# Patient Record
Sex: Female | Born: 1950
Health system: Southern US, Community
[De-identification: ages and names within clinical notes are randomized; demographics above are authoritative.]

## PROBLEM LIST (undated history)

## (undated) DIAGNOSIS — I10 Essential (primary) hypertension: Secondary | ICD-10-CM

## (undated) DIAGNOSIS — K5790 Diverticulosis of intestine, part unspecified, without perforation or abscess without bleeding: Secondary | ICD-10-CM

## (undated) DIAGNOSIS — Z9889 Other specified postprocedural states: Secondary | ICD-10-CM

## (undated) DIAGNOSIS — J189 Pneumonia, unspecified organism: Secondary | ICD-10-CM

## (undated) DIAGNOSIS — T1490XA Injury, unspecified, initial encounter: Secondary | ICD-10-CM

## (undated) DIAGNOSIS — S42009A Fracture of unspecified part of unspecified clavicle, initial encounter for closed fracture: Secondary | ICD-10-CM

## (undated) DIAGNOSIS — R112 Nausea with vomiting, unspecified: Secondary | ICD-10-CM

## (undated) DIAGNOSIS — I351 Nonrheumatic aortic (valve) insufficiency: Secondary | ICD-10-CM

## (undated) DIAGNOSIS — M8580 Other specified disorders of bone density and structure, unspecified site: Secondary | ICD-10-CM

## (undated) DIAGNOSIS — I480 Paroxysmal atrial fibrillation: Secondary | ICD-10-CM

## (undated) DIAGNOSIS — R011 Cardiac murmur, unspecified: Secondary | ICD-10-CM

## (undated) DIAGNOSIS — H539 Unspecified visual disturbance: Secondary | ICD-10-CM

## (undated) HISTORY — DX: Injury, unspecified, initial encounter: T14.90XA

## (undated) HISTORY — DX: Other specified disorders of bone density and structure, unspecified site: M85.80

## (undated) HISTORY — PX: TONSILLECTOMY AND ADENOIDECTOMY: SUR1326

## (undated) HISTORY — DX: Paroxysmal atrial fibrillation: I48.0

## (undated) HISTORY — PX: LAPAROSCOPIC SALPINGO OOPHERECTOMY: SHX5927

## (undated) HISTORY — DX: Essential (primary) hypertension: I10

## (undated) HISTORY — PX: APPENDECTOMY: SHX54

## (undated) HISTORY — DX: Cardiac murmur, unspecified: R01.1

## (undated) HISTORY — DX: Unspecified visual disturbance: H53.9

## (undated) HISTORY — DX: Diverticulosis of intestine, part unspecified, without perforation or abscess without bleeding: K57.90

## (undated) HISTORY — DX: Fracture of unspecified part of unspecified clavicle, initial encounter for closed fracture: S42.009A

## (undated) HISTORY — PX: ABDOMINAL HYSTERECTOMY: SHX81

---

## 1998-10-05 ENCOUNTER — Other Ambulatory Visit: Admission: RE | Admit: 1998-10-05 | Discharge: 1998-10-05 | Payer: Self-pay | Admitting: Gynecology

## 1999-03-11 ENCOUNTER — Other Ambulatory Visit: Admission: RE | Admit: 1999-03-11 | Discharge: 1999-03-11 | Payer: Self-pay | Admitting: Gynecology

## 2000-10-26 ENCOUNTER — Other Ambulatory Visit: Admission: RE | Admit: 2000-10-26 | Discharge: 2000-10-26 | Payer: Self-pay | Admitting: Gynecology

## 2001-06-10 ENCOUNTER — Encounter: Payer: Self-pay | Admitting: Internal Medicine

## 2001-10-29 ENCOUNTER — Other Ambulatory Visit: Admission: RE | Admit: 2001-10-29 | Discharge: 2001-10-29 | Payer: Self-pay | Admitting: Gynecology

## 2002-11-03 ENCOUNTER — Other Ambulatory Visit: Admission: RE | Admit: 2002-11-03 | Discharge: 2002-11-03 | Payer: Self-pay | Admitting: Gynecology

## 2003-11-24 ENCOUNTER — Other Ambulatory Visit: Admission: RE | Admit: 2003-11-24 | Discharge: 2003-11-24 | Payer: Self-pay | Admitting: Gynecology

## 2004-09-08 ENCOUNTER — Ambulatory Visit: Payer: Self-pay | Admitting: Internal Medicine

## 2004-12-06 ENCOUNTER — Other Ambulatory Visit: Admission: RE | Admit: 2004-12-06 | Discharge: 2004-12-06 | Payer: Self-pay | Admitting: Gynecology

## 2005-03-03 ENCOUNTER — Ambulatory Visit: Payer: Self-pay | Admitting: Internal Medicine

## 2005-11-28 ENCOUNTER — Ambulatory Visit: Payer: Self-pay | Admitting: Internal Medicine

## 2005-12-06 ENCOUNTER — Ambulatory Visit: Payer: Self-pay | Admitting: Cardiology

## 2005-12-07 ENCOUNTER — Other Ambulatory Visit: Admission: RE | Admit: 2005-12-07 | Discharge: 2005-12-07 | Payer: Self-pay | Admitting: Gynecology

## 2005-12-21 ENCOUNTER — Ambulatory Visit: Payer: Self-pay | Admitting: Internal Medicine

## 2006-01-16 ENCOUNTER — Ambulatory Visit: Payer: Self-pay | Admitting: Internal Medicine

## 2006-02-13 ENCOUNTER — Ambulatory Visit: Payer: Self-pay | Admitting: Internal Medicine

## 2006-11-30 ENCOUNTER — Encounter: Payer: Self-pay | Admitting: Internal Medicine

## 2006-12-10 ENCOUNTER — Other Ambulatory Visit: Admission: RE | Admit: 2006-12-10 | Discharge: 2006-12-10 | Payer: Self-pay | Admitting: Gynecology

## 2006-12-29 DIAGNOSIS — S42009A Fracture of unspecified part of unspecified clavicle, initial encounter for closed fracture: Secondary | ICD-10-CM

## 2006-12-29 HISTORY — DX: Fracture of unspecified part of unspecified clavicle, initial encounter for closed fracture: S42.009A

## 2007-01-13 ENCOUNTER — Emergency Department (HOSPITAL_COMMUNITY): Admission: EM | Admit: 2007-01-13 | Discharge: 2007-01-13 | Payer: Self-pay | Admitting: Family Medicine

## 2007-01-16 ENCOUNTER — Ambulatory Visit: Payer: Self-pay | Admitting: Internal Medicine

## 2007-02-13 ENCOUNTER — Ambulatory Visit: Payer: Self-pay | Admitting: Internal Medicine

## 2007-03-05 ENCOUNTER — Ambulatory Visit: Payer: Self-pay | Admitting: Internal Medicine

## 2007-03-05 LAB — CONVERTED CEMR LAB
ALT: 19 units/L (ref 0–40)
AST: 25 units/L (ref 0–37)
BUN: 12 mg/dL (ref 6–23)
Bilirubin, Direct: 0.1 mg/dL (ref 0.0–0.3)
CO2: 31 meq/L (ref 19–32)
Eosinophils Absolute: 0.1 10*3/uL (ref 0.0–0.6)
Eosinophils Relative: 2.2 % (ref 0.0–5.0)
GFR calc non Af Amer: 79 mL/min
Glucose, Bld: 92 mg/dL (ref 70–99)
HCT: 41.9 % (ref 36.0–46.0)
Lymphocytes Relative: 30.7 % (ref 12.0–46.0)
MCHC: 34.3 g/dL (ref 30.0–36.0)
MCV: 96.2 fL (ref 78.0–100.0)
Monocytes Relative: 8.1 % (ref 3.0–11.0)
Platelets: 235 10*3/uL (ref 150–400)
Potassium: 3.8 meq/L (ref 3.5–5.1)
Sodium: 143 meq/L (ref 135–145)
Total CHOL/HDL Ratio: 2.9
Triglycerides: 87 mg/dL (ref 0–149)
WBC: 4.3 10*3/uL — ABNORMAL LOW (ref 4.5–10.5)

## 2007-03-12 ENCOUNTER — Ambulatory Visit: Payer: Self-pay | Admitting: Internal Medicine

## 2007-03-28 ENCOUNTER — Encounter: Payer: Self-pay | Admitting: Internal Medicine

## 2007-03-28 DIAGNOSIS — J309 Allergic rhinitis, unspecified: Secondary | ICD-10-CM | POA: Insufficient documentation

## 2007-04-24 ENCOUNTER — Ambulatory Visit: Payer: Self-pay | Admitting: Internal Medicine

## 2007-04-24 ENCOUNTER — Encounter: Payer: Self-pay | Admitting: Internal Medicine

## 2007-05-15 ENCOUNTER — Ambulatory Visit: Payer: Self-pay | Admitting: Internal Medicine

## 2007-05-15 LAB — CONVERTED CEMR LAB: Vit D, 1,25-Dihydroxy: 44 (ref 20–57)

## 2007-05-22 ENCOUNTER — Ambulatory Visit: Payer: Self-pay | Admitting: Internal Medicine

## 2007-05-22 DIAGNOSIS — R030 Elevated blood-pressure reading, without diagnosis of hypertension: Secondary | ICD-10-CM | POA: Insufficient documentation

## 2007-05-22 DIAGNOSIS — E559 Vitamin D deficiency, unspecified: Secondary | ICD-10-CM | POA: Insufficient documentation

## 2007-10-30 ENCOUNTER — Encounter: Payer: Self-pay | Admitting: Internal Medicine

## 2007-11-01 ENCOUNTER — Telehealth: Payer: Self-pay | Admitting: *Deleted

## 2007-12-16 ENCOUNTER — Other Ambulatory Visit: Admission: RE | Admit: 2007-12-16 | Discharge: 2007-12-16 | Payer: Self-pay | Admitting: Gynecology

## 2008-02-13 ENCOUNTER — Ambulatory Visit: Payer: Self-pay | Admitting: Internal Medicine

## 2008-04-20 ENCOUNTER — Telehealth: Payer: Self-pay | Admitting: Internal Medicine

## 2008-04-22 ENCOUNTER — Encounter: Payer: Self-pay | Admitting: Internal Medicine

## 2008-05-08 ENCOUNTER — Ambulatory Visit: Payer: Self-pay | Admitting: Internal Medicine

## 2008-05-08 DIAGNOSIS — F438 Other reactions to severe stress: Secondary | ICD-10-CM

## 2008-05-08 DIAGNOSIS — F4389 Other reactions to severe stress: Secondary | ICD-10-CM | POA: Insufficient documentation

## 2008-05-08 DIAGNOSIS — L259 Unspecified contact dermatitis, unspecified cause: Secondary | ICD-10-CM | POA: Insufficient documentation

## 2008-11-30 LAB — CONVERTED CEMR LAB: Pap Smear: NORMAL

## 2009-02-12 ENCOUNTER — Ambulatory Visit: Payer: Self-pay | Admitting: Internal Medicine

## 2009-03-22 ENCOUNTER — Ambulatory Visit: Payer: Self-pay | Admitting: Internal Medicine

## 2009-04-27 ENCOUNTER — Encounter: Payer: Self-pay | Admitting: Internal Medicine

## 2009-05-10 ENCOUNTER — Encounter: Payer: Self-pay | Admitting: Internal Medicine

## 2009-05-21 ENCOUNTER — Telehealth: Payer: Self-pay | Admitting: Internal Medicine

## 2009-09-03 ENCOUNTER — Ambulatory Visit: Payer: Self-pay | Admitting: Internal Medicine

## 2009-09-03 LAB — CONVERTED CEMR LAB
Alkaline Phosphatase: 34 units/L — ABNORMAL LOW (ref 39–117)
BUN: 18 mg/dL (ref 6–23)
Basophils Relative: 0.4 % (ref 0.0–3.0)
Bilirubin Urine: NEGATIVE
Blood in Urine, dipstick: NEGATIVE
CO2: 30 meq/L (ref 19–32)
Calcium: 9.4 mg/dL (ref 8.4–10.5)
Chloride: 108 meq/L (ref 96–112)
Cholesterol: 214 mg/dL — ABNORMAL HIGH (ref 0–200)
Creatinine, Ser: 0.8 mg/dL (ref 0.4–1.2)
Eosinophils Absolute: 0.1 10*3/uL (ref 0.0–0.7)
Eosinophils Relative: 1.4 % (ref 0.0–5.0)
Glucose, Bld: 87 mg/dL (ref 70–99)
Ketones, urine, test strip: NEGATIVE
Lymphocytes Relative: 30.7 % (ref 12.0–46.0)
Lymphs Abs: 1.6 10*3/uL (ref 0.7–4.0)
MCHC: 33.7 g/dL (ref 30.0–36.0)
MCV: 99.7 fL (ref 78.0–100.0)
Monocytes Relative: 7.4 % (ref 3.0–12.0)
Neutro Abs: 3.2 10*3/uL (ref 1.4–7.7)
Neutrophils Relative %: 60.1 % (ref 43.0–77.0)
Platelets: 213 10*3/uL (ref 150.0–400.0)
RBC: 4.1 M/uL (ref 3.87–5.11)
RDW: 11.7 % (ref 11.5–14.6)
Total CHOL/HDL Ratio: 3
Total Protein: 7.5 g/dL (ref 6.0–8.3)
VLDL: 9.6 mg/dL (ref 0.0–40.0)
WBC Urine, dipstick: NEGATIVE
pH: 8.5

## 2009-09-10 ENCOUNTER — Ambulatory Visit: Payer: Self-pay | Admitting: Internal Medicine

## 2009-09-10 DIAGNOSIS — T1490XA Injury, unspecified, initial encounter: Secondary | ICD-10-CM

## 2009-09-10 HISTORY — DX: Injury, unspecified, initial encounter: T14.90XA

## 2009-10-26 ENCOUNTER — Encounter: Payer: Self-pay | Admitting: Internal Medicine

## 2009-11-17 ENCOUNTER — Encounter: Payer: Self-pay | Admitting: Internal Medicine

## 2010-02-11 ENCOUNTER — Ambulatory Visit: Payer: Self-pay | Admitting: Internal Medicine

## 2010-03-03 ENCOUNTER — Telehealth: Payer: Self-pay | Admitting: *Deleted

## 2010-04-19 ENCOUNTER — Encounter: Payer: Self-pay | Admitting: Internal Medicine

## 2010-05-20 ENCOUNTER — Encounter: Payer: Self-pay | Admitting: Internal Medicine

## 2010-05-23 ENCOUNTER — Encounter: Payer: Self-pay | Admitting: Internal Medicine

## 2010-07-21 ENCOUNTER — Ambulatory Visit: Payer: Self-pay | Admitting: Family Medicine

## 2010-07-21 DIAGNOSIS — R071 Chest pain on breathing: Secondary | ICD-10-CM | POA: Insufficient documentation

## 2010-09-29 ENCOUNTER — Ambulatory Visit: Payer: Self-pay | Admitting: Internal Medicine

## 2010-09-29 DIAGNOSIS — R0789 Other chest pain: Secondary | ICD-10-CM | POA: Insufficient documentation

## 2010-09-30 ENCOUNTER — Ambulatory Visit: Payer: Self-pay | Admitting: Internal Medicine

## 2010-09-30 ENCOUNTER — Encounter: Payer: Self-pay | Admitting: Internal Medicine

## 2010-10-12 ENCOUNTER — Telehealth: Payer: Self-pay | Admitting: Internal Medicine

## 2010-11-15 ENCOUNTER — Encounter: Payer: Self-pay | Admitting: Internal Medicine

## 2010-11-28 ENCOUNTER — Encounter: Payer: Self-pay | Admitting: Internal Medicine

## 2010-12-01 NOTE — Progress Notes (Signed)
Summary: continue on fosamax  Phone Note Call from Patient Call back at [phone]   Caller: Patient Summary of Call: Pt wants to know if she needs to continue on the fosamax. Pt states that we can leave her a message on her cell. Initial call taken by: Romualdo Bolk, CMA Duncan Dull),  October 12, 2010 9:09 AM  Follow-up for Phone Call        since she has been on  meds for over 6 years  would  stop the fosamax and then repeat the dexa in 2 years. Follow-up by: Madelin Headings MD,  October 16, 2010 4:29 PM  Additional Follow-up for Phone Call Additional follow up Details #1::        Notified pt. Additional Follow-up by: Lynann Beaver CMA AAMA,  October 17, 2010 11:29 AM

## 2010-12-01 NOTE — Assessment & Plan Note (Signed)
Summary: DISCUSS MEDS // RS   Vital Signs:  Patient profile:   60 year old female Menstrual status:  postmenopausal Weight:      114 pounds Temp:     98.1 degrees F oral BP sitting:   138 / 78  (left arm) Cuff size:   regular  Vitals Entered By: Sid Falcon LPN (September 29, 2010 10:00 AM)  Serial Vital Signs/Assessments:  Time      Position  BP       Pulse  Resp  Temp     By                     130/78                         Madelin Headings MD  Comments: right arm  reg cuff By: Madelin Headings MD    History of Present Illness: [name] [name] comes in today  for  yearly check. Now   insurance   costs will be going up and wants to check with  any advised monitoring  Since last visit  here  there have been no major changes in health status  . she sees her gynecologist on a regular basis. Has seen Dr Maple Hudson. for her allergies and stable  she has had some vague right  chest tight   since her breast bx.  no nodules noted possibly chest wall near her bra line  just wanted to check. considering botox for couple areas of her face asks about safety. use for facial furrows. BP has been doing well. Osteoporosis : osteopenia: she's been on Fosamax and her last bone density was almost 3 years ago. No recent fractures. Asked if she is due for a recheck..   Preventive Screening-Counseling & Management  Alcohol-Tobacco     Alcohol drinks/day: 0     Smoking Status: never     Passive Smoke Exposure: no  Allergies: 1)  Codeine Phosphate (Codeine Phosphate) 2)  Demerol (Meperidine Hcl)  Past History:  Past medical, surgical, family and social histories (including risk factors) reviewed for relevance to current acute and chronic problems.  Past Medical History: Osteopenia   Dexa  2004 -2  sp  and -1.8  hip ,  2006  -1.8 and -1.6  fosamax 2005  Allergic rhinitis G0P0 Fx clavicle 3/08  hx of breast biopsy right 2011 left  2010  Past Surgical  History: Appendectomy Tonsillectomy infertility surgery for endometriosis  ovary and tube removal Breast biopsy left benign 2010  right 2011   Family History: Reviewed history from 09/10/2009 and no changes required. mom died of scleroderma.    Family History High cholesterol MOM  Fa MI age 42 Sis breast lumps  Social History: Reviewed history from 03/22/2009 and no changes required. Patient has never smoked.  Married works for [employer] 2   2 cats   Review of Systems  The patient denies anorexia, fever, weight loss, weight gain, vision loss, dyspnea on exertion, peripheral edema, prolonged cough, abdominal pain, melena, depression, unusual weight change, abnormal bleeding, and enlarged lymph nodes.         no change in 12 system review.  Physical Exam  General:  Well-developed,well-nourished,in no acute distress; alert,appropriate and cooperative throughout examination Head:  normocephalic and atraumatic.   Breasts:  right breast  without masses  or dimpling   Lungs:  normal respiratory effort and no intercostal retractions.  Heart:  normal rate, regular rhythm, and no murmur.   Psych:  Pt is A&Ox3,affect,speech,memory,attention,&motor skills appear intact.    Impression & Recommendations:  Problem # 1:  OSTEOPENIA/CLAVICLE FX 2008 (ICD-733.90) reasonable to recheck her decks to scan as it is been almost 3 years.continue with exercise calcium vitamin D Her updated medication list for this problem includes:    Fosamax 70 Mg Tabs (Alendronate sodium) .[name]... Take 1 tablet by mouth once a week  Problem # 2:  CHEST DISCOMFORT (ICD-786.59) this seems very minor and clinically insignificant I feel no breast mass I would just observe and follow up with progressive.  Problem # 3:  Hx of ELEVATED BLOOD PRESSURE WITHOUT DIAGNOSIS OF HYPERTENSION (ICD-796.2) borderline elevated today for her but it is better and reassured can monitor this at home  Problem # 4:   COUNSELING NOS (ICD-V65.40) addressed her question about Botox as best as possible.  Complete Medication List: 1)  Fosamax 70 Mg Tabs (Alendronate sodium) .... Take 1 tablet by mouth once a week 2)  Ibuprofen 800 Mg Tabs (Ibuprofen) .... Take 1 tablet by mouth every six hours as needed 3)  Singulair 10 Mg Tabs (Montelukast sodium) .... Take 1 tablet by mouth once a day 4)  Calcium Plus Vit D 1200-1000  .... Once daily 5)  Lorazepam 0.5 Mg Tabs (Lorazepam) .... 1/2 to 1 by mouth two times a day as needed  Patient Instructions: 1)  schedule DEXA scan  this month  dx osteopenia 2)  and then  will let you know results .   Orders Added: 1)  Est. Patient Level IV [96295]

## 2010-12-01 NOTE — Op Note (Signed)
Summary: Stereotactic Biopsy with Mammogram Post Procedure  Stereotactic Biopsy with Mammogram Post Procedure   Imported By: Maryln Gottron 05/26/2010 13:35:15  _____________________________________________________________________  External Attachment:    Type:   Image     Comment:   External Document

## 2010-12-01 NOTE — Assessment & Plan Note (Signed)
Summary: chest pain/trauma?dm   Vital Signs:  Patient profile:   60 year old female Menstrual status:  postmenopausal Weight:      114 pounds O2 Sat:      95 % Temp:     98.8 degrees F Pulse rate:   73 / minute BP sitting:   112 / 74  (left arm)  Vitals Entered By: Pura Spice, RN (July 21, 2010 11:24 AM) CC: vaccuming floors thinks pulled something left shoulder,chest . states ibpruprofen helps and pain improves when taking showers . hurts with deep breathing.    History of Present Illness: here for 10 days of sharp intermittent pain in the left upper chest after she injured herself vacuuming under a couch. While reaching under this couch, she felt a "crunch" sensation along with sharp pain, and this has persisted since then. It got a little better until a few days ago when she tried to do a workout with light upper body work and crunches, and this made it worse again. No SOB. Motrin helps.   Allergies: 1)  Codeine Phosphate (Codeine Phosphate) 2)  Demerol (Meperidine Hcl)  Past History:  Past Medical History: Reviewed history from 09/10/2009 and no changes required. Osteopenia   Dexa  2004 -2  sp  and -1.8  hip ,  2006  -1.8 and -1.6  fosamax 2005  Allergic rhinitis G0P0 Fx clavicle 3/08   Past Surgical History: Reviewed history from 02/11/2010 and no changes required. Appendectomy Tonsillectomy infertility surgery for endometriosis  ovary and tube removal Breast biopsy left benign 2010  Review of Systems  The patient denies anorexia, fever, weight loss, weight gain, vision loss, decreased hearing, hoarseness, syncope, dyspnea on exertion, peripheral edema, prolonged cough, headaches, hemoptysis, abdominal pain, melena, hematochezia, severe indigestion/heartburn, hematuria, incontinence, genital sores, muscle weakness, suspicious skin lesions, transient blindness, difficulty walking, depression, unusual weight change, abnormal bleeding, enlarged lymph nodes,  angioedema, breast masses, and testicular masses.    Physical Exam  General:  Well-developed,well-nourished,in no acute distress; alert,appropriate and cooperative throughout examination Neck:  No deformities, masses, or tenderness noted. Chest Wall:  mildly tender in the left upper chest, no crepitus Lungs:  Normal respiratory effort, chest expands symmetrically. Lungs are clear to auscultation, no crackles or wheezes. Heart:  Normal rate and regular rhythm. S1 and S2 normal without gallop, murmur, click, rub or other extra sounds.   Impression & Recommendations:  Problem # 1:  CHEST WALL PAIN, ACUTE (ICD-786.52)  Her updated medication list for this problem includes:    Ibuprofen 800 Mg Tabs (Ibuprofen) .[name]... Take 1 tablet by mouth every six hours as needed  Complete Medication List: 1)  Fosamax 70 Mg Tabs (Alendronate sodium) .... Take 1 tablet by mouth once a week 2)  Ibuprofen 800 Mg Tabs (Ibuprofen) .... Take 1 tablet by mouth every six hours as needed 3)  Singulair 10 Mg Tabs (Montelukast sodium) .... Take 1 tablet by mouth once a day 4)  Vitamin D 400 Unit Caps (Cholecalciferol) 5)  Lorazepam 0.5 Mg Tabs (Lorazepam) .... 1/2 to 1 by mouth two times a day as needed  Patient Instructions: 1)  This is consistent with a muscular strain or a costochondral separation. Doubt a cracked rib. Use Ibuprofen as needed . rest.  2)  Please schedule a follow-up appointment as needed .  Prescriptions: IBUPROFEN 800 MG TABS (IBUPROFEN) Take 1 tablet by mouth every six hours as needed  #60 x 2   Entered and Authorized by:   Jeannett Senior  Marguerita Beards MD   Signed by:   Nelwyn Salisbury MD on 07/21/2010   Method used:   Electronically to        [facility]  [address] [phone]* (retail)       [address]       [place], [place]  [address]       Ph: [phone] or [phone]       Fax: [phone]   RxID:   [id number]

## 2010-12-01 NOTE — Miscellaneous (Signed)
Summary: BONE DENSITY  Clinical Lists Changes  Orders: Added new Test order of T-Bone Densitometry (77080) - Signed Added new Test order of T-Lumbar Vertebral Assessment (77082) - Signed 

## 2010-12-01 NOTE — Progress Notes (Signed)
Summary: refill on fosamax  Phone Note From Pharmacy   Caller: [facility] Reason for Call: Needs renewal Details for Reason: FOSAMAX Initial call taken by: Romualdo Bolk, CMA (AAMA),  Mar 03, 2010 10:08 AM  Follow-up for Phone Call        Rx faxed to pharmacy. Follow-up by: Romualdo Bolk, CMA (AAMA),  Mar 03, 2010 10:09 AM    Prescriptions: FOSAMAX 70 MG TABS (ALENDRONATE SODIUM) Take 1 tablet by mouth once a week  #12 x 3   Entered by:   Romualdo Bolk, CMA (AAMA)   Authorized by:   Madelin Headings MD   Signed by:   Romualdo Bolk, CMA (AAMA) on 03/03/2010   Method used:   Faxed to ...       [facility] (mail-order)       [address]       [place], [place]  [address]  [place]       Ph:        Fax: [phone]   RxID:   [id number]

## 2010-12-01 NOTE — Assessment & Plan Note (Signed)
Summary: 1 YEAR RETURN/MH   Primary Provider/Referring Provider:  Berniece Andreas  CC:  Yearly follow up visit.  History of Present Illness: 02/13/08  She returns for one year follow-up of her allergic rhinitis doing very well.  She had been skin test positive in the past.  She is not having acute seasonal discomfort now and has less postnasal drip.  She credits Singulair.  There has been no chest congestion, cough, and wheeze or dyspnea.  We discussed treatment alternatives.  We also discussed the growing attention to vitamin D as it interacts with the immune system.  She feels her present condition is satisfactory.  02/12/09- Allergic rhinitis  Several days of head congestion without sore throat or fever after spending a day outdoors. Much better in past 2 days. some itch and sneeze. She feels it was more like allergy. Now husband has similar pattern. She stays on singulair. Added Tylenol sinus for few days til it made her jittery. No chest congestion or wheeze.  February 11, 2010- Allergic rhinitis She hasn't recognized a seasonal rhinitis problem yet. She credits Singulair and has not stayed on antihistamines. We reviewed the Epocrates listing of side effects. Gets about 2 colds per year treated otc. She had questions about Zinc based meds and sense of smell.    Current Medications (verified): 1)  Fosamax 70 Mg Tabs (Alendronate Sodium) .... Take 1 Tablet By Mouth Once A Week 2)  Ibuprofen 800 Mg Tabs (Ibuprofen) .... Take 1 Tablet By Mouth Every Six Hours As Needed 3)  Singulair 10 Mg Tabs (Montelukast Sodium) .... Take 1 Tablet By Mouth Once A Day 4)  Vitamin D 400 Unit Caps (Cholecalciferol) 5)  Lorazepam 0.5 Mg  Tabs (Lorazepam) .... 1/2 To 1 By Mouth Two Times A Day As Needed  Allergies (verified): 1)  Codeine Phosphate (Codeine Phosphate) 2)  Demerol (Meperidine Hcl)  Past History:  Past Medical History: Last updated: September 20, 2009 Osteopenia   Dexa  2004 -2  sp  and -1.8  hip ,   2006  -1.8 and -1.6  fosamax 2005  Allergic rhinitis G0P0 Fx clavicle 3/08   Family History: Last updated: Sep 20, 2009 mom died of scleroderma.    Family History High cholesterol MOM  Fa MI age 80 Sis breast lumps  Social History: Last updated: 03/22/2009 Patient has never smoked.  Married works for [employer] 2   2 cats   Risk Factors: Alcohol Use: 0 (Sep 20, 2009) Caffeine Use: 3 (09/20/2009) Exercise: yes (20-Sep-2009)  Risk Factors: Smoking Status: never (Sep 20, 2009) Passive Smoke Exposure: no (09-20-09)  Past Surgical History: Appendectomy Tonsillectomy infertility surgery for endometriosis  ovary and tube removal Breast biopsy left benign 2010  Review of Systems      See HPI  The patient denies anorexia, fever, weight loss, weight gain, vision loss, decreased hearing, hoarseness, chest pain, syncope, dyspnea on exertion, peripheral edema, prolonged cough, headaches, hemoptysis, abdominal pain, and severe indigestion/heartburn.    Vital Signs:  Patient profile:   60 year old female Menstrual status:  postmenopausal Height:      65 inches Weight:      118.50 pounds BMI:     19.79 O2 Sat:      99 % on Room air Pulse rate:   75 / minute BP sitting:   120 / 76  (left arm) Cuff size:   regular  Vitals Entered By: Reynaldo Minium CMA (February 11, 2010 9:26 AM)  O2 Flow:  Room air  Physical Exam  Additional Exam:  General: A/Ox3; pleasant and cooperative, NAD, trim SKIN: no rash, lesions NODES: no lymphadenopathy HEENT: [place]/AT, EOM- WNL, Conjuctivae- clear, PERRLA, TM-left TM retracted, Nose- clear, with turbinate edema, Throat- clear and wnl NECK: Supple w/ fair ROM, JVD- none, normal carotid impulses w/o bruits Thyroid-  CHEST: Clear to P&A HEART: RRR, no m/g/r heard ABDOMEN:  ZOX:WRUE, nl pulses, no edema  NEURO: Grossly intact to observation      Impression & Recommendations:  Problem # 1:  ALLERGIC RHINITIS (ICD-477.9)  Good control.  Singulair has made a sufficient difference. We reviewed it in contrast to antihistamines and nasal steroid sprays. She can use it seasonally if that is effective for her. Miinimize use of zinc based meds in the nose if concerned about sense of smell.  Other Orders: Est. Patient Level III (45409)  Patient Instructions: 1)  Please schedule a follow-up appointment in 1 year. 2)  Refill script for Singulair Prescriptions: SINGULAIR 10 MG TABS (MONTELUKAST SODIUM) Take 1 tablet by mouth once a day  #90 x 3   Entered and Authorized by:   Waymon Budge MD   Signed by:   Waymon Budge MD on 02/11/2010   Method used:   Print then Give to Patient   RxID:   [id number]

## 2011-02-08 ENCOUNTER — Encounter: Payer: Self-pay | Admitting: Internal Medicine

## 2011-02-09 ENCOUNTER — Ambulatory Visit (INDEPENDENT_AMBULATORY_CARE_PROVIDER_SITE_OTHER): Payer: BC Managed Care – HMO | Admitting: Internal Medicine

## 2011-02-09 ENCOUNTER — Encounter: Payer: Self-pay | Admitting: Internal Medicine

## 2011-02-09 VITALS — BP 116/64 | HR 68 | Ht 65.0 in | Wt 113.6 lb

## 2011-02-09 DIAGNOSIS — J309 Allergic rhinitis, unspecified: Secondary | ICD-10-CM

## 2011-02-09 MED ORDER — MONTELUKAST SODIUM 10 MG PO TABS: 10.0000 mg | ORAL_TABLET | Freq: Every day | ORAL | Status: DC

## 2011-02-09 NOTE — Progress Notes (Signed)
  Subjective:    Patient ID: [name] [name], female    DOB: 1951/01/26, 60 y.o.   MRN: [id number]  HPI 41 yoF followed for allergic rhinitis. Last here February 11, 2010 and reports no major health changes. Being outdoors more recently she has noted some nasal congestion. Bothered more by watery drainage. We discussed available decongestants and antihistamines. singulair has worked better for her than antihistamines and we explained the difference again.   Review of Systems See HPI Constitutional:   No weight loss, night sweats,  Fevers, chills, fatigue, lassitude. HEENT:   No headaches,  Difficulty swallowing,  Tooth/dental problems,  Sore throat,                No sneezing, itching, ear ache,, post nasal drip,   CV:  No chest pain,  Orthopnea, PND, swelling in lower extremities, anasarca, dizziness, palpitations  GI  No heartburn, indigestion, abdominal pain, nausea, vomiting, diarrhea, change in bowel habits, loss of appetite  Resp: No shortness of breath with exertion or at rest.  No excess mucus, no productive cough,  No non-productive cough,  No coughing up of blood.  No change in color of mucus.  No wheezing.    Skin: no rash or lesions.  GU: no dysuria, change in color of urine, no urgency or frequency.  No flank pain.  MS:  No joint pain or swelling.  No decreased range of motion.  No back pain.  Psych:  No change in mood or affect. No depression or anxiety.  No memory loss.      Objective:   Physical Exam General- Alert, Oriented, Affect-appropriate, Distress- none acute  Skin- rash-none, lesions- none, excoriation- none  Lymphadenopathy- none  Head- atraumatic  Eyes- Gross vision intact, PERRLA, conjunctivae clear secretions  Ears- Hearing-normal- canals, Tm L ,   R ,  Nose- Clear,  No-Septal dev, mucus, polyps, erosion, perforation   Throat- Mallampati II , mucosa clear , drainage- none, tonsils- atrophic  Neck- flexible , trachea midline, no stridor , thyroid nl,  carotid no bruit  Chest - symmetrical excursion , unlabored     Heart/CV- RRR , no murmur , no gallop  , no rub, nl s1 s2                     - JVD- none , edema- none, stasis changes- none, varices- none     Lung- clear to P&A, wheeze- none, cough- none , dullness-none, rub- none     Chest wall- Abd- tender-no, distended-no, bowel sounds-present, HSM- no  Br/ Gen/ Rectal- Not done, not indicated  Extrem- cyanosis- none, clubbing, none, atrophy- none, strength- nl  Neuro- grossly intact to observation         Assessment & Plan:

## 2011-02-09 NOTE — Patient Instructions (Signed)
Singulair refilled for 90 day script.   Antihistamines for watery, itch and sneeze:    Claritin/ loratadine     Allegra/ fexofenadine     Zyrtec/ cetirizine----this one makes some people a little drowsy, so they take it at bedtime  Decongestants for stuffiness- will be stimulants     Sudafed- sing at pharmacy counter (most "-D" products have added sudafed as a second drug in the combination     Phenylephrine ("-PE") is available off the shelf without signing. Not quite as strong as sudafed, but same idea.

## 2011-02-09 NOTE — Assessment & Plan Note (Signed)
We reviewed types of medicines available.  Singulair has worked really well. Needs refill.

## 2011-02-10 ENCOUNTER — Ambulatory Visit: Payer: Self-pay | Admitting: Internal Medicine

## 2011-02-12 ENCOUNTER — Encounter: Payer: Self-pay | Admitting: Internal Medicine

## 2011-03-17 NOTE — Assessment & Plan Note (Signed)
[facility]                             PULMONARY OFFICE NOTE   NAME:[name] [name] [name] [name]                          MRN:          [id number]  DATE:02/13/2007                            DOB:          10-15-1951    PROBLEM:  Allergic rhinitis.   HISTORY:  Last year she was having significant problems with rhinitis  and also cervical headache, both of which have been doing better.  She  did fall break her collarbone a month or so ago.  She had questions  about the long-term safety of Singulair, concerned it might be raising  her blood pressure.  I showed her the file in my Epocrates table and we  discussed this.  I suggested that she experiment with her need for it by  trying it every other day and then every other week, dropping off if not  useful.  She may be able to stay off of it outside of pollen seasons  even if she does need it for some of the year.  I do not have a long-  term blood pressure concern related to that medicine though.   MEDICATIONS:  1. Fosamax.  2. Singulair.  3. Rare use of Claritin.  4. Ibuprofen.   ALLERGIES:  Drug intolerant to CODEINE and DEMEROL.   OBJECTIVE:  VITAL SIGNS:  Weight 115 pounds.  Blood pressure 126/82,  pulse regular 78, room air saturation 99%.  HEENT:  There is some mucous bridging and crusting.  Mucosa looks  normal.  Turbinates are not substantially edematous.  There is no post  nasal drip.  Conjunctivae are clear.  LUNGS:  Clear.  CARDIOVASCULAR:  Normal.   IMPRESSION:  Allergic rhinitis, skin test positive, never needed allergy  vaccine.   PLAN:  We have refilled her Singulair for 10 mg daily p.r.n.  Schedule  return one year, earlier p.r.n.     Clinton D. Maple Hudson, MD, Tonny Bollman, FACP  Electronically Signed    CDY/MedQ  DD: 02/13/2007  DT: 02/13/2007  Job #: [id number]   cc:   Neta Mends. Fabian Sharp, MD

## 2011-05-31 ENCOUNTER — Encounter: Payer: Self-pay | Admitting: Internal Medicine

## 2011-08-25 ENCOUNTER — Ambulatory Visit (INDEPENDENT_AMBULATORY_CARE_PROVIDER_SITE_OTHER): Payer: BC Managed Care – HMO

## 2011-08-25 DIAGNOSIS — Z23 Encounter for immunization: Secondary | ICD-10-CM

## 2011-08-28 ENCOUNTER — Encounter: Payer: Self-pay | Admitting: Gastroenterology

## 2011-10-10 ENCOUNTER — Encounter: Payer: Self-pay | Admitting: Internal Medicine

## 2011-10-10 ENCOUNTER — Ambulatory Visit (INDEPENDENT_AMBULATORY_CARE_PROVIDER_SITE_OTHER): Payer: BC Managed Care – HMO | Admitting: Internal Medicine

## 2011-10-10 VITALS — BP 120/80 | HR 78 | Wt 110.0 lb

## 2011-10-10 DIAGNOSIS — H3530 Unspecified macular degeneration: Secondary | ICD-10-CM

## 2011-10-10 DIAGNOSIS — R21 Rash and other nonspecific skin eruption: Secondary | ICD-10-CM

## 2011-10-12 ENCOUNTER — Encounter: Payer: Self-pay | Admitting: Internal Medicine

## 2011-10-12 DIAGNOSIS — H3530 Unspecified macular degeneration: Secondary | ICD-10-CM | POA: Insufficient documentation

## 2011-10-12 DIAGNOSIS — R21 Rash and other nonspecific skin eruption: Secondary | ICD-10-CM | POA: Insufficient documentation

## 2011-10-12 NOTE — Patient Instructions (Signed)
Observation fu if  persistent or progressive   See opthalmol for further advice about the  Dx pf MD

## 2011-10-12 NOTE — Progress Notes (Signed)
  Subjective:    Patient ID: [name] [name], female    DOB: 02-11-1951, 60 y.o.   MRN: [id number]  HPI Patient comes in today for SDA  For acute problem evaluation. Awoke this am with a rash linear around waist with out illness pain or itching  Has hx of shingles and concern it could happen again.  NO rx and no itching . No hx of same  No new clothes or jewelry  No new meds except  Vitamins per optometrist who said she had some macular degeneration.     Review of Systems No cp sob fever vision change. No hx o f latex allergy  Past history family history social history reviewed in the electronic medical record.     Objective:   Physical Exam WDWN in nad Skin: normal capillary refill ,turgor , color: No ,petechiae or bruising There is a linear almost waist band pink rash confluent anterior and not near back . No dermatome distribution and is bilateral and continuous. NO vesicle or pustule or swelling.     Assessment & Plan:  Rash    Definitely  not shingles  ? Poss topical sensitivity  Contact rash Disc  possibilities and   Expectant management.  Topical emolients ok  avoid belts and tight bands.  Poss early macular degeneration with family hx of same.  Agree to get ophthalmologist involved

## 2011-12-04 ENCOUNTER — Ambulatory Visit (INDEPENDENT_AMBULATORY_CARE_PROVIDER_SITE_OTHER): Payer: BC Managed Care – HMO | Admitting: Internal Medicine

## 2011-12-04 ENCOUNTER — Encounter: Payer: Self-pay | Admitting: Internal Medicine

## 2011-12-04 VITALS — BP 132/82 | HR 96 | Temp 98.8°F | Wt 111.0 lb

## 2011-12-04 DIAGNOSIS — R002 Palpitations: Secondary | ICD-10-CM | POA: Insufficient documentation

## 2011-12-04 DIAGNOSIS — R059 Cough, unspecified: Secondary | ICD-10-CM

## 2011-12-04 DIAGNOSIS — R058 Other specified cough: Secondary | ICD-10-CM | POA: Insufficient documentation

## 2011-12-04 DIAGNOSIS — J22 Unspecified acute lower respiratory infection: Secondary | ICD-10-CM

## 2011-12-04 DIAGNOSIS — R05 Cough: Secondary | ICD-10-CM

## 2011-12-04 DIAGNOSIS — J988 Other specified respiratory disorders: Secondary | ICD-10-CM

## 2011-12-04 MED ORDER — AZITHROMYCIN 250 MG PO TABS: 250.0000 mg | ORAL_TABLET | ORAL | Status: AC

## 2011-12-04 NOTE — Patient Instructions (Addendum)
Antibiotic  If not getting better quickly . Because of the relapsing character of this coughing illness  And Concern about early PNA  .    If fever chills and shortness of breath then contact us.   In regard to the palpitations    If  persistent or progressive then consider cards eval for this reason.   But in the meantime get labs . And   Track these and then follow up

## 2011-12-04 NOTE — Progress Notes (Signed)
  Subjective:    Patient ID: [name] [name], female    DOB: [birth year], 61 y.o.   MRN: [id number]  HPI Patient comes in today for SDA  For acute problem evaluation.  Jan 17 with cough and then cold  And then uri continuing and chest congestion that continued and though getting better and then 2 days ago  ,   Weakness and appetite  And  Feeling sick and then  Felt had fever flushed  Felt like when had PNA   . Some better the next day.  Today am worse with coughing .  Sleeping better . But coughing  Alka seltzer cold and cough at night.  Dust  Renovation in office   Gets chest tightness  / if sob  Unsure if all of this needs different intervention  Review of Systems Nog cp bleeding  Vision change some inermittent palpations  Not related to exercise and no syncope with this  intermittent and no cough edema or wheezing  No change in exercise tolerance . Asks about this sx .   Past history family history social history reviewed in the electronic medical record. Outpatient Encounter Prescriptions as of 12/04/2011  Medication Sig Dispense Refill  . Calcium Carbonate-Vitamin D (CALCIUM PLUS VITAMIN D PO) Take 1 capsule by mouth daily.        . montelukast (SINGULAIR) 10 MG tablet Take 1 tablet (10 mg total) by mouth daily.  90 tablet  3  . Multiple Vitamins-Minerals (PRESERVISION AREDS PO) Take by mouth.        [name]     0  . ibuprofen (ADVIL,MOTRIN) 800 MG tablet Take 800 mg by mouth every 6 (six) hours as needed.        [name] LORazepam (ATIVAN) 0.5 MG tablet Take 1/2 to 1 by mouth twice daily as needed            Objective:   Physical Exam WDWN in NAD  quiet respirations; mildly congested  somewhat hoarse. Non toxic . HEENT: Normocephalic ;atraumatic , Eyes;  PERRL, EOMs  Full, lids and conjunctiva clear,,Ears: no deformities, canals nl, TM landmarks normal, Nose: no deformity or discharge but congested;face non tender Mouth : OP clear without lesion or edema . Neck: Supple without adenopathy or masses or  bruits Chest:  Clear to A&P without wheezes rales or rhonchi CV:  S1-S2 no gallops or murmurs peripheral perfusion is normal Skin :nl perfusion and no acute rashes  Neuro grossly non focal         Assessment & Plan:  Relapsing rti with lower sx    Concern about secondary infection  Options discussed  X ray vs wait bs empiric antibiotic    Will  Add antibiotic if not  Getting better  Or other   Palpitations  Disc extremely common but if f  persistent or progressive and assoc with any sx or anything but transient can do further eval.   Will monitor  .

## 2011-12-07 DIAGNOSIS — J22 Unspecified acute lower respiratory infection: Secondary | ICD-10-CM | POA: Insufficient documentation

## 2011-12-08 ENCOUNTER — Other Ambulatory Visit (INDEPENDENT_AMBULATORY_CARE_PROVIDER_SITE_OTHER): Payer: BC Managed Care – HMO

## 2011-12-08 DIAGNOSIS — R002 Palpitations: Secondary | ICD-10-CM

## 2011-12-08 LAB — POCT URINALYSIS DIPSTICK
Bilirubin, UA: NEGATIVE
Glucose, UA: NEGATIVE
Ketones, UA: NEGATIVE
Leukocytes, UA: NEGATIVE
Nitrite, UA: NEGATIVE

## 2011-12-08 LAB — CBC WITH DIFFERENTIAL/PLATELET
Basophils Relative: 0.6 % (ref 0.0–3.0)
Eosinophils Relative: 1.1 % (ref 0.0–5.0)
Hemoglobin: 13 g/dL (ref 12.0–15.0)
Lymphocytes Relative: 26.4 % (ref 12.0–46.0)
MCHC: 33.3 g/dL (ref 30.0–36.0)
MCV: 98.1 fl (ref 78.0–100.0)
Neutro Abs: 4 10*3/uL (ref 1.4–7.7)
Neutrophils Relative %: 65.4 % (ref 43.0–77.0)
RBC: 3.99 Mil/uL (ref 3.87–5.11)
WBC: 6.2 10*3/uL (ref 4.5–10.5)

## 2011-12-08 LAB — T3, FREE: T3, Free: 2.5 pg/mL (ref 2.3–4.2)

## 2011-12-08 LAB — BASIC METABOLIC PANEL
CO2: 29 mEq/L (ref 19–32)
Calcium: 9.4 mg/dL (ref 8.4–10.5)
Chloride: 104 mEq/L (ref 96–112)
Creatinine, Ser: 0.8 mg/dL (ref 0.4–1.2)
Sodium: 140 mEq/L (ref 135–145)

## 2011-12-08 LAB — T4, FREE: Free T4: 1 ng/dL (ref 0.60–1.60)

## 2011-12-08 LAB — HEPATIC FUNCTION PANEL
ALT: 14 U/L (ref 0–35)
Albumin: 3.7 g/dL (ref 3.5–5.2)
Alkaline Phosphatase: 40 U/L (ref 39–117)
Bilirubin, Direct: 0 mg/dL (ref 0.0–0.3)
Total Protein: 7.4 g/dL (ref 6.0–8.3)

## 2011-12-08 LAB — LIPID PANEL: Total CHOL/HDL Ratio: 3

## 2011-12-11 ENCOUNTER — Telehealth: Payer: Self-pay | Admitting: *Deleted

## 2011-12-11 DIAGNOSIS — M545 Low back pain, unspecified: Secondary | ICD-10-CM

## 2011-12-11 DIAGNOSIS — M25559 Pain in unspecified hip: Secondary | ICD-10-CM

## 2011-12-11 NOTE — Telephone Encounter (Signed)
Severe lower back and hip area. Motrin 800mg  does help for a few hours. Pt has saw ortho years ago for a broken collar bone. No burning upon urination. Pt thinks it's poor posture. It start suddenly on Friday around noon. Chair at work was uncomfortable. The pain as been like this all weekend. It occurs every several months but not to this extent.

## 2011-12-11 NOTE — Telephone Encounter (Signed)
Per Dr. Fabian Sharp- Try to see if ortho can see her. [facility] can see her today at 2:30pm with Dr. Renae Fickle. She needs to be there at 2:15pm.

## 2011-12-12 ENCOUNTER — Encounter: Payer: Self-pay | Admitting: Internal Medicine

## 2011-12-19 ENCOUNTER — Ambulatory Visit (INDEPENDENT_AMBULATORY_CARE_PROVIDER_SITE_OTHER): Payer: BC Managed Care – HMO | Admitting: Internal Medicine

## 2011-12-19 ENCOUNTER — Encounter: Payer: Self-pay | Admitting: Internal Medicine

## 2011-12-19 VITALS — BP 130/80 | HR 60 | Wt 109.0 lb

## 2011-12-19 DIAGNOSIS — M549 Dorsalgia, unspecified: Secondary | ICD-10-CM

## 2011-12-19 DIAGNOSIS — H3530 Unspecified macular degeneration: Secondary | ICD-10-CM

## 2011-12-19 DIAGNOSIS — J988 Other specified respiratory disorders: Secondary | ICD-10-CM

## 2011-12-19 DIAGNOSIS — R002 Palpitations: Secondary | ICD-10-CM

## 2011-12-19 DIAGNOSIS — J22 Unspecified acute lower respiratory infection: Secondary | ICD-10-CM

## 2011-12-19 NOTE — Patient Instructions (Signed)
The palpitations episodes may be an electrical  Cause  . But will arrange echocardiogram to  Ensure the heart muscle is functioning well. Which appears to be the case .  And then cardiology  To see if you should have an event monitor done to see if this can be seen " on tape" .  Someone will contact you about this. Palpitations are very common but it is helpful to rule out important ones.   You labs are very good.  Call us if you need forms  Completed.

## 2011-12-19 NOTE — Progress Notes (Signed)
  Subjective:    Patient ID: [name] [name], female    DOB: [birth year], 61 y.o.   MRN: [id number]  HPI Comes in for follow up of  multiple medical issues Since last visit resp infection is a lot better after antibiotic and pred  Developed acute lbr pain with radiation  Dx b dr Renae Fickle lumbarL 4-5 5-1 facetogenic and dsicogenic pain .  Prednisone  Course   Helped and also  1/2 hydrocodone  5 325 .  Prn at night  Chair at work a problem    Beginning to walk again.    Palpitations are some better since last visit  Episodes are sporadic and off and on lasting 30 minutes or more  Seems rapid heart rate  Saw Dr Dione Booze ;No evidence of  MD at this; time to get yearly checks may have had a small drusen Review of Systems Neg fever sob syncope  No bleeding no syncope.   Past history family history social history reviewed in the electronic medical record.     Objective:   Physical Exam WDWN in nad Neck: Supple without adenopathy or masses or bruits Chest:  Clear to A&P without wheezes rales or rhonchi CV:  S1-S2 no gallops or murmurs peripheral perfusion is normal Neuro non focal  Lab Results  Component Value Date   WBC 6.2 12/08/2011   HGB 13.0 12/08/2011   HCT 39.1 12/08/2011   PLT 391.0 12/08/2011   GLUCOSE 79 12/08/2011   CHOL 192 12/08/2011   TRIG 91.0 12/08/2011   HDL 57.20 12/08/2011   LDLDIRECT 131.0 09/03/2009   LDLCALC 117* 12/08/2011   ALT 14 12/08/2011   AST 20 12/08/2011   NA 140 12/08/2011   K 4.2 12/08/2011   CL 104 12/08/2011   CREATININE 0.8 12/08/2011   BUN 20 12/08/2011   CO2 29 12/08/2011   TSH 0.53 12/08/2011     EKG nsr      Assessment & Plan:  RTI better Acute Lumbar pain with radicular sx getting better  may have been aggravated by  rti  Palpitations sound like racing episodes   30 minutes or more. ? If svt or other.   echo and cards to see ? If should get ane event monitor as her episode although getting better last 30 minutes or more.  doesn't really sound like anxiety although she has  some sx only at certain situations

## 2011-12-23 ENCOUNTER — Encounter: Payer: Self-pay | Admitting: Internal Medicine

## 2011-12-23 DIAGNOSIS — M549 Dorsalgia, unspecified: Secondary | ICD-10-CM | POA: Insufficient documentation

## 2011-12-23 NOTE — Assessment & Plan Note (Signed)
Follow opthal yearly

## 2012-01-02 ENCOUNTER — Other Ambulatory Visit (HOSPITAL_COMMUNITY): Payer: BC Managed Care – HMO

## 2012-01-05 ENCOUNTER — Ambulatory Visit (HOSPITAL_COMMUNITY): Payer: BC Managed Care – HMO | Attending: Cardiovascular Disease

## 2012-01-05 ENCOUNTER — Other Ambulatory Visit: Payer: Self-pay

## 2012-01-05 DIAGNOSIS — R011 Cardiac murmur, unspecified: Secondary | ICD-10-CM | POA: Insufficient documentation

## 2012-01-05 DIAGNOSIS — R002 Palpitations: Secondary | ICD-10-CM | POA: Insufficient documentation

## 2012-01-08 NOTE — Progress Notes (Signed)
Quick Note:  Left message for pt to call back ______ 

## 2012-01-09 ENCOUNTER — Telehealth: Payer: Self-pay | Admitting: Cardiology

## 2012-01-09 ENCOUNTER — Telehealth: Payer: Self-pay | Admitting: *Deleted

## 2012-01-09 NOTE — Telephone Encounter (Signed)
Pt called wanting to know if the multi-vitamin that she is taking could be making her aortic regurgitation worse or could have caused it.  Reviewed with pt causes that lead to AR and reassured her that multi vitamins do not contribute to AR.  She states understanding.

## 2012-01-09 NOTE — Telephone Encounter (Signed)
Pt aware of echocardiogram results.

## 2012-01-09 NOTE — Telephone Encounter (Signed)
Reviewed results of 2 D Echo thoroughly with pt who had many questions and was quite anxious.  She will keep her appointment as scheduled but aware I will call her if we get a cancellation.  She will call back with further questions.

## 2012-01-09 NOTE — Telephone Encounter (Signed)
Pt talked with Pam and was told to call back if she had any other questions, and she has one more

## 2012-01-09 NOTE — Telephone Encounter (Signed)
Pt had an echo and pcp gave her the results this am, pt has questions about it and wondered if due to the results if she should make a sooner appt? New pt appt 01-16-12,  pls call [phone]

## 2012-01-09 NOTE — Progress Notes (Signed)
Quick Note:  Pt aware and states she will keep her appt on 01/16/12 with Dr. Antoine Poche. ______

## 2012-01-09 NOTE — Telephone Encounter (Signed)
Pt returning call to [facility] please call her back.

## 2012-01-16 ENCOUNTER — Ambulatory Visit (INDEPENDENT_AMBULATORY_CARE_PROVIDER_SITE_OTHER): Payer: BC Managed Care – HMO | Admitting: Cardiology

## 2012-01-16 ENCOUNTER — Encounter: Payer: Self-pay | Admitting: Cardiology

## 2012-01-16 VITALS — BP 150/95 | HR 92 | Ht 65.0 in | Wt 109.0 lb

## 2012-01-16 DIAGNOSIS — I359 Nonrheumatic aortic valve disorder, unspecified: Secondary | ICD-10-CM

## 2012-01-16 DIAGNOSIS — R002 Palpitations: Secondary | ICD-10-CM

## 2012-01-16 DIAGNOSIS — I351 Nonrheumatic aortic (valve) insufficiency: Secondary | ICD-10-CM | POA: Insufficient documentation

## 2012-01-16 MED ORDER — DILTIAZEM HCL ER COATED BEADS 120 MG PO CP24: 120.0000 mg | ORAL_CAPSULE | Freq: Every day | ORAL | Status: DC

## 2012-01-16 NOTE — Progress Notes (Signed)
HPI The patient presents for evaluation of palpitations. She has no prior cardiac history. She's noticed palpitations since the fall of last year. She'll notice some typically in the evenings. Her heart will beat faster and more pronounced. It may last for 1-1/2-2 hours. It happens at rest. She really had about 3-4 episodes of this and hasn't had any since about February. She did have blood work which was unremarkable. However, an echocardiogram ordered demonstrated an ejection fraction of 50-55% with moderate aortic insufficiency.  She has been somewhat limited by some back problems recently. However, she's done physical therapy and has done well with this. She cannot bring on the palpitations. She does not get chest pressure, neck or arm discomfort. She has not had shortness of breath, PND or orthopnea. She has no weight gain or edema.  Of note she does drink caffeine in the morning and has had some chocolate in the evenings routinely though she's cutting back on this.  Allergies  Allergen Reactions  . Codeine Phosphate     REACTION: unspecified  . Meperidine Hcl     REACTION: unspecified    Current Outpatient Prescriptions  Medication Sig Dispense Refill  . Calcium Carbonate-Vitamin D (CALCIUM PLUS VITAMIN D PO) Take 1 capsule by mouth daily.        [name] HYDROcodone-acetaminophen (NORCO) 5-325 MG per tablet       . ibuprofen (ADVIL,MOTRIN) 800 MG tablet Take 800 mg by mouth every 6 (six) hours as needed.        [name] LORazepam (ATIVAN) 0.5 MG tablet Take 1/2 to 1 by mouth twice daily as needed       . montelukast (SINGULAIR) 10 MG tablet Take 1 tablet (10 mg total) by mouth daily.  90 tablet  3  . Multiple Vitamins-Minerals (PRESERVISION AREDS PO) Take by mouth.        . diltiazem (CARDIZEM CD) 120 MG 24 hr capsule Take 1 capsule (120 mg total) by mouth daily.  30 capsule  11    Past Medical History  Diagnosis Date  . Osteopenia   . Allergic rhinitis   . Clavicle fracture 3/08     Past Surgical History  Procedure Date  . Tonsillectomy and adenoidectomy     Family History  Problem Relation Age of Onset  . Scleroderma Mother   . Hyperlipidemia Mother   . Coronary artery disease Father 40    History   Social History  . Marital Status: Married    Spouse Name: N/A    Number of Children: N/A  . Years of Education: N/A   Occupational History  . Newspaper Jouralist    Social History Main Topics  . Smoking status: Never Smoker   . Smokeless tobacco: Not on file  . Alcohol Use: Not on file  . Drug Use: Not on file  . Sexually Active: Not on file   Other Topics Concern  . Not on file   Social History Narrative   [name] [name] of 2 p0g0Works for [employer] and [employer]     ROS:  Positive for palpitations, seasonal allergies, varicose veins. Otherwise as stated in the HPI and negative for all other systems.  PHYSICAL EXAM BP 150/95  Pulse 92  Ht 5\' 5"  (1.651 m)  Wt 109 lb (49.442 kg)  BMI 18.14 kg/m2 GENERAL:  Well appearing HEENT:  Pupils equal round and reactive, fundi not visualized, oral mucosa unremarkable NECK:  No jugular venous distention, waveform within normal limits, carotid upstroke brisk and symmetric, no  bruits, no thyromegaly LYMPHATICS:  No cervical, inguinal adenopathy LUNGS:  Clear to auscultation bilaterally BACK:  No CVA tenderness CHEST:  Unremarkable HEART:  PMI not displaced or sustained,S1 and S2 within normal limits, no S3, no S4, no clicks, no rubs, diastolic murmur mid to late at the left 3rd intercostal space. ABD:  Flat, positive bowel sounds normal in frequency in pitch, no bruits, no rebound, no guarding, no midline pulsatile mass, no hepatomegaly, no splenomegaly EXT:  2 plus pulses throughout, no edema, no cyanosis no clubbing SKIN:  No rashes no nodules NEURO:  Cranial nerves II through XII grossly intact, motor grossly intact throughout PSYCH:  Cognitively intact, oriented to person place and time  EKG:  Sinus rhythm,  rate 66, axis within normal limits, intervals within normal limits, no acute ST-T wave changes. (12/19/11)   ASSESSMENT AND PLAN

## 2012-01-16 NOTE — Assessment & Plan Note (Signed)
At this point I will treat these symptomatically with the addition of calcium channel blockers which also are indicated for the treatment of aortic insufficiency. Labs have already been ordered and were normal. Further evaluation and management will be based on how she feels with this medication change.

## 2012-01-16 NOTE — Patient Instructions (Signed)
Your physician recommends that you schedule a follow-up appointment in: 6 weeks with Dr. Antoine Poche.  Your physician has requested that you have an echocardiogram in September 2013.[name] Echocardiography is a painless test that uses sound waves to create images of your heart. It provides your doctor with information about the size and shape of your heart and how well your heart's chambers and valves are working. This procedure takes approximately one hour. There are no restrictions for this procedure.  Start Cardizem CD 120mg  daily.

## 2012-01-16 NOTE — Assessment & Plan Note (Signed)
The patient has moderate aortic insufficiency. We discussed this diagnosis in detail. At this point I would add a calcium channel blocker as above. I will repeat an echocardiogram in 6 months.

## 2012-01-17 ENCOUNTER — Telehealth: Payer: Self-pay | Admitting: Cardiology

## 2012-01-17 NOTE — Telephone Encounter (Signed)
New Problem:    Patient called because she had a few questions about the medication that Dr. Antoine Poche prescribed for her yesterday (diltiazem (CARDIZEM CD) 120 MG 24 hr capsule) as it relates to dizziness and driving.  She was wondering if she should drive, related to work, within the first few days that she may possible have dizziness.  Please call back.

## 2012-01-17 NOTE — Telephone Encounter (Signed)
Pt highly concerned about the side effect of the cardizem she was given yesterday.  She has not started the medication because the bottle says it may cause dizziness and to not operate machinery.  Explained to pt that the medication can cause dizziness if BP should drop too low however her BP was 150/92.  Pt would prefer to wait until Saturday to start when she doesn't have to drive.  She is aware that there is always some one on call and of how to contact them if necessary.

## 2012-02-09 ENCOUNTER — Encounter: Payer: Self-pay | Admitting: Internal Medicine

## 2012-02-09 ENCOUNTER — Ambulatory Visit (INDEPENDENT_AMBULATORY_CARE_PROVIDER_SITE_OTHER): Payer: BC Managed Care – PPO | Admitting: Internal Medicine

## 2012-02-09 VITALS — BP 114/78 | HR 73 | Ht 65.5 in | Wt 112.0 lb

## 2012-02-09 DIAGNOSIS — J309 Allergic rhinitis, unspecified: Secondary | ICD-10-CM

## 2012-02-09 MED ORDER — MONTELUKAST SODIUM 10 MG PO TABS: 10.0000 mg | ORAL_TABLET | Freq: Every day | ORAL | Status: DC

## 2012-02-09 NOTE — Progress Notes (Signed)
    Patient ID: [name] [name], female    DOB: 1951/09/11, 61 y.o.   MRN: [id number]  HPI 16 yoF followed for allergic rhinitis. Last here February 11, 2010 and reports no major health changes. Being outdoors more recently she has noted some nasal congestion. Bothered more by watery drainage. We discussed available decongestants and antihistamines. singulair has worked better for her than antihistamines and we explained the difference again.  02/09/12- 59 yoF never smoker followed for allergic rhinitis. She had a prolonged bronchitis episode this winter but is better now. Evaluated for palpitation with echocardiogram and diagnosed aortic insufficiency, treated with diltiazem. Dr Antoine Poche. Control is comfortable with no problems this spring. Singulair helps and is sufficient without an antihistamine.  ROS-see HPI Constitutional:   No-   weight loss, night sweats, fevers, chills, fatigue, lassitude. HEENT:   No-  headaches, difficulty swallowing, tooth/dental problems, sore throat,       No-  sneezing, itching, ear ache, nasal congestion, post nasal drip,  CV:  No-   chest pain, orthopnea, PND, swelling in lower extremities, anasarca, dizziness, palpitations Resp: No-   shortness of breath with exertion or at rest.              No-   productive cough,  No non-productive cough,  No- coughing up of blood.              No-   change in color of mucus.  No- wheezing.   Skin: No-   rash or lesions. GI:  No-   heartburn, indigestion, abdominal pain, nausea, vomiting,  GU:  MS:  No-   joint pain or swelling.  No- decreased range of motion.  No- back pain. Neuro-     nothing unusual Psych:  No- change in mood or affect. No depression or anxiety.  No memory loss.   OBJ- Physical Exam General- Alert, Oriented, Affect-appropriate, Distress- none acute Skin- rash-none, lesions- none, excoriation- none Lymphadenopathy- none Head- atraumatic            Eyes- Gross vision intact, PERRLA, conjunctivae and  secretions clear            Ears- Hearing, canals-normal            Nose- Clear, pale, no-Septal dev, mucus, polyps, erosion, perforation             Throat- Mallampati II , mucosa clear , drainage- none, tonsils- atrophic Neck- flexible , trachea midline, no stridor , thyroid nl, carotid no bruit Chest - symmetrical excursion , unlabored           Heart/CV- RRR , trace diastolic murmur L sternum , no gallop  , no rub, nl s1 s2                           - JVD- none , edema- none, stasis changes- none, varices- none           Lung- clear to P&A, wheeze- none, cough- none , dullness-none, rub- none           Chest wall-  Abd- Br/ Gen/ Rectal- Not done, not indicated Extrem- cyanosis- none, clubbing, none, atrophy- none, strength- nl Neuro- grossly intact to observation

## 2012-02-09 NOTE — Patient Instructions (Signed)
Script refilled for Singulair/ montelukast  Feel free to add an otc antihistamine like Claritin/ loratadine or Allegra/ fexofenadine as needed  Please call as needed

## 2012-02-15 NOTE — Assessment & Plan Note (Addendum)
Good control with Singulair which has made a big difference for her. And we discussed alternatives, expectations and environmental controls.

## 2012-03-01 ENCOUNTER — Encounter: Payer: Self-pay | Admitting: Cardiology

## 2012-03-01 ENCOUNTER — Ambulatory Visit (INDEPENDENT_AMBULATORY_CARE_PROVIDER_SITE_OTHER): Payer: BC Managed Care – PPO | Admitting: Cardiology

## 2012-03-01 VITALS — BP 145/80 | HR 80 | Ht 65.5 in | Wt 109.1 lb

## 2012-03-01 DIAGNOSIS — R002 Palpitations: Secondary | ICD-10-CM

## 2012-03-01 DIAGNOSIS — I359 Nonrheumatic aortic valve disorder, unspecified: Secondary | ICD-10-CM

## 2012-03-01 DIAGNOSIS — I351 Nonrheumatic aortic (valve) insufficiency: Secondary | ICD-10-CM

## 2012-03-01 DIAGNOSIS — R030 Elevated blood-pressure reading, without diagnosis of hypertension: Secondary | ICD-10-CM

## 2012-03-01 MED ORDER — DILTIAZEM HCL ER COATED BEADS 120 MG PO CP24: 120.0000 mg | ORAL_CAPSULE | Freq: Every day | ORAL | Status: DC

## 2012-03-01 NOTE — Progress Notes (Signed)
   HPI The patient presents for follow up of palpitations.  She also has aortic insufficiency. At the last appointment I started Cardizem. She thinks her palpitations are less frequent and less sustained. The patient denies any new symptoms such as chest discomfort, neck or arm discomfort. There has been no new shortness of breath, PND or orthopnea. There have been no reported palpitations, presyncope or syncope.  Allergies  Allergen Reactions  . Codeine Phosphate     REACTION: unspecified  . Meperidine Hcl     REACTION: unspecified    Current Outpatient Prescriptions  Medication Sig Dispense Refill  . Calcium Carbonate-Vitamin D (CALCIUM PLUS VITAMIN D PO) Take 1 capsule by mouth daily.        [name] diltiazem (CARDIZEM CD) 120 MG 24 hr capsule Take 1 capsule (120 mg total) by mouth daily.  30 capsule  11  . ibuprofen (ADVIL,MOTRIN) 800 MG tablet Take 800 mg by mouth every 6 (six) hours as needed.        [name] LORazepam (ATIVAN) 0.5 MG tablet Take 1/2 to 1 by mouth twice daily as needed       . montelukast (SINGULAIR) 10 MG tablet Take 1 tablet (10 mg total) by mouth daily.  90 tablet  3  . Multiple Vitamins-Minerals (PRESERVISION AREDS PO) Take by mouth daily.         Past Medical History  Diagnosis Date  . Osteopenia   . Allergic rhinitis   . Clavicle fracture 3/08    Past Surgical History  Procedure Date  . Tonsillectomy and adenoidectomy     ROS:  Positive for palpitations, seasonal allergies, varicose veins. Otherwise as stated in the HPI and negative for all other systems.  PHYSICAL EXAM BP 145/80  Pulse 80  Ht 5' 5.5" (1.664 m)  Wt 109 lb 1.9 oz (49.497 kg)  BMI 17.88 kg/m2 GENERAL:  Well appearing NECK:  No jugular venous distention, waveform within normal limits, carotid upstroke brisk and symmetric, no bruits, no thyromegaly LUNGS:  Clear to auscultation bilaterally BACK:  No CVA tenderness CHEST:  Unremarkable HEART:  PMI not displaced or sustained,S1 and S2 within  normal limits, no S3, no S4, no clicks, no rubs, diastolic murmur mid to late at the left 3rd intercostal space. ABD:  Flat, positive bowel sounds normal in frequency in pitch, no bruits, no rebound, no guarding, no midline pulsatile mass, no hepatomegaly, no splenomegaly EXT:  2 plus pulses throughout, no edema, no cyanosis no clubbing  ASSESSMENT AND PLAN

## 2012-03-01 NOTE — Assessment & Plan Note (Signed)
These are improving she will continue the meds as listed.

## 2012-03-01 NOTE — Patient Instructions (Signed)
The current medical regimen is effective;  continue present plan and medications.  130/85 is the upper limit of what Dr Antoine Poche would like your blood pressure to be at.  Please check your blood pressure at home and call if you see a trend that is remaining elevated.  Follow up with Dr Antoine Poche after your 2 D Echo.

## 2012-03-01 NOTE — Assessment & Plan Note (Signed)
The patient has moderate aortic insufficiency. We discussed have this diagnosis in detail.  I will repeat an echocardiogram in Sept.

## 2012-03-01 NOTE — Assessment & Plan Note (Signed)
This was mildly elevated today but she is anxious. She'll keep an eye on this at home.

## 2012-04-15 ENCOUNTER — Encounter: Payer: Self-pay | Admitting: Internal Medicine

## 2012-04-15 ENCOUNTER — Ambulatory Visit (INDEPENDENT_AMBULATORY_CARE_PROVIDER_SITE_OTHER): Payer: BC Managed Care – PPO | Admitting: Internal Medicine

## 2012-04-15 VITALS — BP 120/80 | Temp 99.2°F | Wt 110.0 lb

## 2012-04-15 DIAGNOSIS — I351 Nonrheumatic aortic (valve) insufficiency: Secondary | ICD-10-CM

## 2012-04-15 DIAGNOSIS — I359 Nonrheumatic aortic valve disorder, unspecified: Secondary | ICD-10-CM

## 2012-04-15 DIAGNOSIS — J309 Allergic rhinitis, unspecified: Secondary | ICD-10-CM

## 2012-04-15 DIAGNOSIS — J069 Acute upper respiratory infection, unspecified: Secondary | ICD-10-CM

## 2012-04-15 MED ORDER — HYDROCODONE-HOMATROPINE 5-1.5 MG/5ML PO SYRP: 5.0000 mL | ORAL_SOLUTION | Freq: Four times a day (QID) | ORAL | Status: AC | PRN

## 2012-04-15 NOTE — Progress Notes (Signed)
  Subjective:    Patient ID: [name] [name], female    DOB: Feb 15, 1951, 61 y.o.   MRN: [id number]  HPI 61 year old patient who presents with a seven-day history of largely nonproductive cough she has had some mild the head and chest congestion. She does have a history of allergic rhinitis. Past medical history is also pertinent for a history of moderate aortic insufficiency. She was seen by cardiology last month and is scheduled for followup 2-D echo in September. No fever or chills;  she has a codeine allergy but has tolerated hydrocodone without difficulty   Review of Systems  Constitutional: Positive for fatigue.  HENT: Positive for congestion. Negative for hearing loss, sore throat, rhinorrhea, dental problem, sinus pressure and tinnitus.   Eyes: Negative for pain, discharge and visual disturbance.  Respiratory: Positive for cough. Negative for shortness of breath.   Cardiovascular: Negative for chest pain, palpitations and leg swelling.  Gastrointestinal: Negative for nausea, vomiting, abdominal pain, diarrhea, constipation, blood in stool and abdominal distention.  Genitourinary: Negative for dysuria, urgency, frequency, hematuria, flank pain, vaginal bleeding, vaginal discharge, difficulty urinating, vaginal pain and pelvic pain.  Musculoskeletal: Negative for joint swelling, arthralgias and gait problem.  Skin: Negative for rash.  Neurological: Negative for dizziness, syncope, speech difficulty, weakness, numbness and headaches.  Hematological: Negative for adenopathy.  Psychiatric/Behavioral: Negative for behavioral problems, dysphoric mood and agitation. The patient is not nervous/anxious.        Objective:   Physical Exam  Constitutional: She is oriented to person, place, and time. She appears well-developed and well-nourished.  HENT:  Head: Normocephalic.  Right Ear: External ear normal.  Left Ear: External ear normal.  Mouth/Throat: Oropharynx is clear and moist.  Eyes:  Conjunctivae and EOM are normal. Pupils are equal, round, and reactive to light.  Neck: Normal range of motion. Neck supple. No thyromegaly present.  Cardiovascular: Normal rate, regular rhythm, normal heart sounds and intact distal pulses.   Pulmonary/Chest: Effort normal and breath sounds normal.  Abdominal: Soft. Bowel sounds are normal. She exhibits no mass. There is no tenderness.  Musculoskeletal: Normal range of motion.  Lymphadenopathy:    She has no cervical adenopathy.  Neurological: She is alert and oriented to person, place, and time.  Skin: Skin is warm and dry. No rash noted.  Psychiatric: She has a normal mood and affect. Her behavior is normal.          Assessment & Plan:   Viral URI with cough History of allergic rhinitis  Will treat symptomatically

## 2012-04-15 NOTE — Patient Instructions (Signed)
Get plenty of rest, Drink lots of  clear liquids, and use Tylenol or ibuprofen for fever and discomfort.    Call or return to clinic prn if these symptoms worsen or fail to improve as anticipated.  

## 2012-04-22 ENCOUNTER — Telehealth: Payer: Self-pay | Admitting: Internal Medicine

## 2012-04-22 MED ORDER — BENZONATATE 100 MG PO CAPS: 100.0000 mg | ORAL_CAPSULE | Freq: Three times a day (TID) | ORAL | Status: AC | PRN

## 2012-04-22 NOTE — Telephone Encounter (Signed)
Spoke to the pt.  She was seen last week by Dr. Kirtland Bouchard for cough.  Was prescribe cough hydrocodone-homatropine.  This is causing her to be constipated.  She would like something else for cough to take a night so she can sleep.  She has stopped the hydrocodone.  Trying Delsym during the day but that is making her have nausea.  Can you suggest something for daytime?  Also, her tongue is covered in white (new sx and no antibiotics).  She says it feels raw.  Has tried some home remedies but does not seem to be working.  Please advise.

## 2012-04-22 NOTE — Telephone Encounter (Signed)
Sent to the pharmacy.  Informed pt to brush her tongue.  Pt notified by telephone.

## 2012-04-22 NOTE — Telephone Encounter (Signed)
All the narcotic cough meds  Can cause constipation.  Can try antihistamine or      Tessalon perles  100 mg 1 po tid prn cough disp  :30 no refills  Unsure what to tell her about the tongue . Except for tooth  brushing it and rinse with peroxyl. Type wash.  If  persistent or progressive  Or very painful we can look at it.

## 2012-04-22 NOTE — Telephone Encounter (Signed)
Caller: [name] [name]/Patient; PCP: Madelin Headings.; CB#: [phone];  Call regarding White Tongue; seen 06/17 for cough, was prescribed Hydrocodone Homatropin syrup, no antibiotics.  Noted appearance of white tongue on 06/24.  Continues with coughing.  Has not taken RX for cough since 06/22 due to causing constipation.   Afebrile.  Emergent sx negative.  Home care advice per "Mouth Lesions"  due to "tongue appears furred and discolored."  Call back parameters reviewed.

## 2012-05-03 ENCOUNTER — Encounter: Payer: Self-pay | Admitting: Internal Medicine

## 2012-05-03 ENCOUNTER — Ambulatory Visit (INDEPENDENT_AMBULATORY_CARE_PROVIDER_SITE_OTHER): Payer: BC Managed Care – PPO | Admitting: Internal Medicine

## 2012-05-03 ENCOUNTER — Telehealth: Payer: Self-pay | Admitting: Internal Medicine

## 2012-05-03 VITALS — BP 146/80 | HR 97 | Temp 98.7°F | Wt 108.0 lb

## 2012-05-03 DIAGNOSIS — J312 Chronic pharyngitis: Secondary | ICD-10-CM

## 2012-05-03 DIAGNOSIS — J309 Allergic rhinitis, unspecified: Secondary | ICD-10-CM

## 2012-05-03 DIAGNOSIS — J329 Chronic sinusitis, unspecified: Secondary | ICD-10-CM

## 2012-05-03 DIAGNOSIS — I351 Nonrheumatic aortic (valve) insufficiency: Secondary | ICD-10-CM

## 2012-05-03 DIAGNOSIS — R0982 Postnasal drip: Secondary | ICD-10-CM

## 2012-05-03 DIAGNOSIS — K143 Hypertrophy of tongue papillae: Secondary | ICD-10-CM

## 2012-05-03 DIAGNOSIS — I359 Nonrheumatic aortic valve disorder, unspecified: Secondary | ICD-10-CM

## 2012-05-03 NOTE — Progress Notes (Signed)
Subjective:    Patient ID: [name] [name], female    DOB: 06-12-1951, 61 y.o.   MRN: [id number]  HPI Patient comes in today for SDA for  new problem evaluation. Actually going on for a month: had and illness  With St onset June 10th then had with some fever  Saw Dr Kirtland Bouchard June 17th when had a "hard cough" and no voice.  since then has had phlegm feeling in throat and had cough med s  See phone Note then developed whitish tongue without pain or fever or ulcers.  Using listerine and brushing tongue some improvement  .feels has bad odor and throat clearing and throat irritation.  Was better today not yesterday . Waxes and wanes.  takiing her singlulair for allergy. No sneeze or  Cp sob.   Review of Systems No nvd syncope sob wheezing . No swollen glands currently. Cannot take deong because of cv istuation and has hx of some antihistamines causing wakefulness.  Past history family history social history reviewed in the electronic medical record. Outpatient Encounter Prescriptions as of 05/03/2012  Medication Sig Dispense Refill  . Calcium Carbonate-Vitamin D (CALCIUM PLUS VITAMIN D PO) Take 1 capsule by mouth daily.        [name] diltiazem (CARDIZEM CD) 120 MG 24 hr capsule Take 1 capsule (120 mg total) by mouth daily.  90 capsule  3  . ibuprofen (ADVIL,MOTRIN) 800 MG tablet Take 800 mg by mouth every 6 (six) hours as needed.        [name] LORazepam (ATIVAN) 0.5 MG tablet Take 1/2 to 1 by mouth twice daily as needed       . montelukast (SINGULAIR) 10 MG tablet Take 1 tablet (10 mg total) by mouth daily.  90 tablet  3  . Multiple Vitamins-Minerals (PRESERVISION AREDS PO) Take by mouth daily.            Objective:   Physical Exam BP 146/80  Pulse 97  Temp 98.7 F (37.1 C) (Oral)  Wt 108 lb (48.988 kg)  SpO2 97%  WDWN in nAD HEENT: Normocephalic ;atraumatic , Eyes;  PERRL, EOMs  Full, lids and conjunctiva clear,,Ears: no deformities, canals nl, TM landmarks normal, Nose: no deformity or discharge  Mouth :  OP   1+ REDNESS  Cobblestoning tongue faint whiteness no ulcer or lesion seem buccal mucosa clear.  Neck: Supple without adenopathy or masses or bruits minimally hoarse. Chest:  Clear to A&P without wheezes rales or rhonchi CV:  S1-S2 no gallops  peripheral perfusion is normal fait diastolic murmur and no click when sitting upright  No clubbing cyanosis or edema Wt Readings from Last 3 Encounters:  05/03/12 108 lb (48.988 kg)  04/15/12 110 lb (49.896 kg)  03/01/12 109 lb 1.9 oz (49.497 kg)       Assessment & Plan:   Persistent upper resp sx waxing and waning  S/p what sounds like viral RTI.   Underlying allergy hx  Persistent sore throat and  Hoarseness without sig nasal congestion  prob post nasal drainage.  Clear .  dont thibk that this is bacterial infection ata this time but could be allergic  Or reactive phenom.   Long disc about strategy and alarm features . No obv mucosal disease  Doesn't really look like monilia.  Continue  Mouth hygiene and add  Antihistamine to assess response prn.   No alarm features today.  FU contact if  persistent or progressive   Total visit > 50% spent counseling  and coordinating care

## 2012-05-03 NOTE — Telephone Encounter (Signed)
Pt has arrived for appt.

## 2012-05-03 NOTE — Telephone Encounter (Signed)
Caller: [name] [name]/Patient; PCP: Madelin Headings.; CB#: [phone]; Calling today 05/03/12 regarding sore throat, onset 04/08/12.  Does not thinks she has fever.  Tongue is white toward the back of tongue and looks bumpy.  Emergent symptoms r/o by Sore Throat or Hoarseness guidelines with exception of no tonsils and white patches on back of throat.  Appt scheduled for today 05/03/12 at 11:15 AM with Dr. Fabian Sharp.

## 2012-05-03 NOTE — Patient Instructions (Signed)
I think the majority of your symptoms are from postnasal drainage this could be residual of the infection that you had but also could be related to allergy respiratory symptoms. I don't see any evidence of a bacterial infection at this time  This problem could be  aggravated by poor air quality that we are having at this time.  I would advise a trial of over-the-counter plain Zyrtec generic ok. And take it once a day for a few days to see if this helps her symptoms.  Continue the mouth hygiene  Contact us if you have fevers swollen glands but also let us know an update in a week  or so..  At this time I do not see anything alarming on your exam and  chest is clear.  Continue on Singulair

## 2012-05-05 DIAGNOSIS — J312 Chronic pharyngitis: Secondary | ICD-10-CM | POA: Insufficient documentation

## 2012-05-05 DIAGNOSIS — R0982 Postnasal drip: Secondary | ICD-10-CM | POA: Insufficient documentation

## 2012-06-27 ENCOUNTER — Encounter: Payer: Self-pay | Admitting: Internal Medicine

## 2012-07-18 ENCOUNTER — Ambulatory Visit (HOSPITAL_COMMUNITY): Payer: BC Managed Care – PPO | Attending: Cardiology | Admitting: Radiology

## 2012-07-18 DIAGNOSIS — I369 Nonrheumatic tricuspid valve disorder, unspecified: Secondary | ICD-10-CM | POA: Insufficient documentation

## 2012-07-18 DIAGNOSIS — I359 Nonrheumatic aortic valve disorder, unspecified: Secondary | ICD-10-CM | POA: Insufficient documentation

## 2012-07-18 DIAGNOSIS — R002 Palpitations: Secondary | ICD-10-CM | POA: Insufficient documentation

## 2012-07-18 NOTE — Progress Notes (Signed)
Echocardiogram performed.  

## 2012-07-25 ENCOUNTER — Ambulatory Visit (INDEPENDENT_AMBULATORY_CARE_PROVIDER_SITE_OTHER): Payer: BC Managed Care – PPO | Admitting: Cardiology

## 2012-07-25 ENCOUNTER — Encounter: Payer: Self-pay | Admitting: Cardiology

## 2012-07-25 VITALS — BP 140/80 | HR 71 | Ht 65.0 in | Wt 107.4 lb

## 2012-07-25 DIAGNOSIS — I359 Nonrheumatic aortic valve disorder, unspecified: Secondary | ICD-10-CM

## 2012-07-25 DIAGNOSIS — I351 Nonrheumatic aortic (valve) insufficiency: Secondary | ICD-10-CM

## 2012-07-25 NOTE — Patient Instructions (Addendum)
Continue current medications as listed.  Follow up in 1 year with Dr Hochrein.  You will receive a letter in the mail 2 months before you are due.  Please call us when you receive this letter to schedule your follow up appointment.  

## 2012-07-25 NOTE — Progress Notes (Signed)
   HPI The patient presents for follow up of palpitations.  She also has aortic insufficiency. Since I last saw her she has done well. The patient denies any new symptoms such as chest discomfort, neck or arm discomfort. There has been no new shortness of breath, PND or orthopnea. There have been no reported palpitations, presyncope or syncope.  She has not been taking her BP as I asked.  She does exercise  Allergies  Allergen Reactions  . Codeine Phosphate     REACTION: unspecified  . Meperidine Hcl     REACTION: unspecified    Current Outpatient Prescriptions  Medication Sig Dispense Refill  . Calcium Carbonate-Vitamin D (CALCIUM PLUS VITAMIN D PO) Take 1 capsule by mouth daily.        [name] diltiazem (CARDIZEM CD) 120 MG 24 hr capsule Take 1 capsule (120 mg total) by mouth daily.  90 capsule  3  . ibuprofen (ADVIL,MOTRIN) 800 MG tablet Take 800 mg by mouth every 6 (six) hours as needed.        [name] LORazepam (ATIVAN) 0.5 MG tablet Take 1/2 to 1 by mouth twice daily as needed       . montelukast (SINGULAIR) 10 MG tablet Take 1 tablet (10 mg total) by mouth daily.  90 tablet  3  . Multiple Vitamins-Minerals (PRESERVISION AREDS PO) Take by mouth daily.         Past Medical History  Diagnosis Date  . Osteopenia   . Allergic rhinitis   . Clavicle fracture 3/08    Past Surgical History  Procedure Date  . Tonsillectomy and adenoidectomy     ROS:  Positive for palpitations, seasonal allergies, varicose veins. Otherwise as stated in the HPI and negative for all other systems.  PHYSICAL EXAM BP 140/80  Pulse 71  Ht 5\' 5"  (1.651 m)  Wt 107 lb 6.4 oz (48.716 kg)  BMI 17.87 kg/m2 GENERAL:  Well appearing NECK:  No jugular venous distention, waveform within normal limits, carotid upstroke brisk and symmetric, no bruits, no thyromegaly LUNGS:  Clear to auscultation bilaterally BACK:  No CVA tenderness CHEST:  Unremarkable HEART:  PMI not displaced or sustained,S1 and S2 within normal  limits, no S3, no S4, no clicks, no rubs, diastolic murmur mid to late at the left 3rd intercostal space. ABD:  Flat, positive bowel sounds normal in frequency in pitch, no bruits, no rebound, no guarding, no midline pulsatile mass, no hepatomegaly, no splenomegaly EXT:  2 plus pulses throughout, no edema, no cyanosis no clubbing  Sinus rhythm, rate 71, axis within normal limits, intervals within normal limits, no acute ST-T wave changes.  07/25/2012  ASSESSMENT AND PLAN  Aortic insufficiency -  Her AI is mild by echo last week.  There will be no change in therapy.  I will follow her in one year with an exam and decide on the timing of repeat echo.    Palpitations -  These are stable. she will continue the meds as listed.  ELEVATED BLOOD PRESSURE WITHOUT DIAGNOSIS OF HYPERTENSION -  Her BP at home is ok.  I asked her to watch it with BPs two to three times per week randomly.  She will continue the meds as listed.

## 2013-02-13 ENCOUNTER — Ambulatory Visit (INDEPENDENT_AMBULATORY_CARE_PROVIDER_SITE_OTHER): Payer: BC Managed Care – PPO | Admitting: Internal Medicine

## 2013-02-13 ENCOUNTER — Encounter: Payer: Self-pay | Admitting: Internal Medicine

## 2013-02-13 VITALS — BP 108/70 | HR 70 | Ht 65.5 in | Wt 110.0 lb

## 2013-02-13 DIAGNOSIS — J309 Allergic rhinitis, unspecified: Secondary | ICD-10-CM

## 2013-02-13 MED ORDER — MONTELUKAST SODIUM 10 MG PO TABS: 10.0000 mg | ORAL_TABLET | Freq: Every day | ORAL | Status: DC

## 2013-02-13 NOTE — Progress Notes (Signed)
Patient ID: [name] [name], female    DOB: 1950/11/09, 62 y.o.   MRN: [id number]  HPI 63 yoF followed for allergic rhinitis. Last here February 11, 2010 and reports no major health changes. Being outdoors more recently she has noted some nasal congestion. Bothered more by watery drainage. We discussed available decongestants and antihistamines. singulair has worked better for her than antihistamines and we explained the difference again.  02/09/12- 59 yoF never smoker followed for allergic rhinitis. She had a prolonged bronchitis episode this winter but is better now. Evaluated for palpitation with echocardiogram and diagnosed aortic insufficiency, treated with diltiazem. Dr Antoine Poche. Control is comfortable with no problems this spring. Singulair helps and is sufficient without an antihistamine.  02/13/13- 61 yoF never smoker followed for allergic rhinitis. FOLLOWS FOR: Allergies are doing well. Might have had a mold exposure. Singulair is helping to manage her symptoms. She got into mold exposure while cleaning a house last summer. Made her feel congested and tired. Doing well now.  ROS-see HPI Constitutional:   No-   weight loss, night sweats, fevers, chills, fatigue, lassitude. HEENT:   No-  headaches, difficulty swallowing, tooth/dental problems, sore throat,       No-  sneezing, itching, ear ache, nasal congestion, post nasal drip,  CV:  No-   chest pain, orthopnea, PND, swelling in lower extremities, anasarca, dizziness, palpitations Resp: No-   shortness of breath with exertion or at rest.              No-   productive cough,  No non-productive cough,  No- coughing up of blood.              No-   change in color of mucus.  No- wheezing.   Skin: No-   rash or lesions. GI:  No-   heartburn, indigestion, abdominal pain, nausea, vomiting,  GU:  MS:  No-   joint pain or swelling.   Neuro-     nothing unusual Psych:  No- change in mood or affect. No depression or anxiety.  No memory  loss.   OBJ- Physical Exam General- Alert, Oriented, Affect-appropriate, Distress- none acute .  Trim. Skin- rash-none, lesions- none, excoriation- none Lymphadenopathy- none Head- atraumatic            Eyes- Gross vision intact, PERRLA, conjunctivae and secretions clear            Ears- Hearing, canals-normal            Nose- Clear, pale, no-Septal dev, mucus, polyps, erosion, perforation             Throat- Mallampati II , mucosa clear , drainage- none, tonsils- atrophic Neck- flexible , trachea midline, no stridor , thyroid nl, carotid no bruit Chest - symmetrical excursion , unlabored           Heart/CV- RRR , trace diastolic murmur L sternum , no gallop  , no rub, nl s1 s2                           - JVD- none , edema- none, stasis changes- none, varices- none           Lung- clear to P&A, wheeze- none, cough- none , dullness-none, rub- none           Chest wall-  Abd- Br/ Gen/ Rectal- Not done, not indicated Extrem- cyanosis- none, clubbing, none, atrophy- none, strength- nl Neuro- grossly intact  to observation

## 2013-02-13 NOTE — Patient Instructions (Addendum)
Script sent to [facility] for [employer]  Consider trying an otc nasal saline gel product- AYR and store brands are fine- as needed for dry nose.

## 2013-02-21 ENCOUNTER — Other Ambulatory Visit: Payer: Self-pay | Admitting: *Deleted

## 2013-02-21 DIAGNOSIS — I351 Nonrheumatic aortic (valve) insufficiency: Secondary | ICD-10-CM

## 2013-02-21 MED ORDER — DILTIAZEM HCL ER COATED BEADS 120 MG PO CP24: 120.0000 mg | ORAL_CAPSULE | Freq: Every day | ORAL | Status: DC

## 2013-02-21 NOTE — Assessment & Plan Note (Signed)
Possible symptoms last summer from heavy mold exposure but not a sustained problem. We discussed medications, agreed to refill Singulair and educated on saline gel.

## 2013-02-25 ENCOUNTER — Telehealth: Payer: Self-pay | Admitting: Family Medicine

## 2013-02-25 NOTE — Telephone Encounter (Signed)
This patient was recently seen by Dr. Dione Booze and he recommended that she have a follow up with her primary care.  WP would like to see her in the office at her convenience.  Please schedule with the patient and let me know when she is coming.  Thanks!!!

## 2013-02-26 NOTE — Telephone Encounter (Signed)
Pt is coming on 03-13-13 930am

## 2013-03-13 ENCOUNTER — Encounter: Payer: Self-pay | Admitting: Internal Medicine

## 2013-03-13 ENCOUNTER — Ambulatory Visit (INDEPENDENT_AMBULATORY_CARE_PROVIDER_SITE_OTHER): Payer: BC Managed Care – PPO | Admitting: Internal Medicine

## 2013-03-13 VITALS — BP 124/80 | HR 70 | Temp 97.6°F | Wt 109.0 lb

## 2013-03-13 DIAGNOSIS — I351 Nonrheumatic aortic (valve) insufficiency: Secondary | ICD-10-CM

## 2013-03-13 DIAGNOSIS — M21619 Bunion of unspecified foot: Secondary | ICD-10-CM

## 2013-03-13 DIAGNOSIS — L602 Onychogryphosis: Secondary | ICD-10-CM

## 2013-03-13 DIAGNOSIS — H539 Unspecified visual disturbance: Secondary | ICD-10-CM

## 2013-03-13 DIAGNOSIS — I359 Nonrheumatic aortic valve disorder, unspecified: Secondary | ICD-10-CM

## 2013-03-13 DIAGNOSIS — H3530 Unspecified macular degeneration: Secondary | ICD-10-CM

## 2013-03-13 DIAGNOSIS — M21611 Bunion of right foot: Secondary | ICD-10-CM | POA: Insufficient documentation

## 2013-03-13 DIAGNOSIS — L608 Other nail disorders: Secondary | ICD-10-CM

## 2013-03-13 HISTORY — DX: Unspecified visual disturbance: H53.9

## 2013-03-13 LAB — BASIC METABOLIC PANEL
BUN: 16 mg/dL (ref 6–23)
CO2: 30 mEq/L (ref 19–32)
Calcium: 9.4 mg/dL (ref 8.4–10.5)
Chloride: 105 mEq/L (ref 96–112)
Creatinine, Ser: 0.7 mg/dL (ref 0.4–1.2)
Glucose, Bld: 81 mg/dL (ref 70–99)
Potassium: 3.6 mEq/L (ref 3.5–5.1)
Sodium: 141 mEq/L (ref 135–145)

## 2013-03-13 LAB — CBC WITH DIFFERENTIAL/PLATELET
Eosinophils Relative: 0.8 % (ref 0.0–5.0)
Lymphocytes Relative: 27.6 % (ref 12.0–46.0)
Monocytes Absolute: 0.6 10*3/uL (ref 0.1–1.0)
Monocytes Relative: 8.3 % (ref 3.0–12.0)
Neutrophils Relative %: 63 % (ref 43.0–77.0)
Platelets: 228 10*3/uL (ref 150.0–400.0)
RBC: 4.37 Mil/uL (ref 3.87–5.11)
WBC: 7.2 10*3/uL (ref 4.5–10.5)

## 2013-03-13 LAB — SEDIMENTATION RATE: Sed Rate: 15 mm/hr (ref 0–22)

## 2013-03-13 LAB — C-REACTIVE PROTEIN: CRP: <0.5 mg/dL (ref 0.5–20.0)

## 2013-03-13 NOTE — Progress Notes (Signed)
Chief Complaint  Patient presents with  . Follow-up    seenat  dr Dione Booze. vision changes intermittent    HPI: Pt comes in after evaluation at end of February for episodes of transient decreased vision  And Dr Dione Booze found normal visual fields and no plaques or vascular changes. But was was concerned about vascular cause of sx.   such as amaurosis fugax .    There was a delay in getting appointment with [place] .  But we called recently to have her come in and get an updated fax from Dr. Ronal Fear office Didn't concern her much and she's not having that many symptoms.    gets a short-lived episode about every 3 weeks or less but it was worse previously. These episodes been going on for about 3 years. She can tell when they're coming immediately before she sits down and relaxes and they go away it is both eyes but more likely the right eye .    She describes this as;   ocass sudden cloudiness or fogginess of part of her vision usually inferior horizontal but no squiggly lines or loss of vision. No associated symptoms    If stress.  Lasts about   3 minutes and resolves.   Totally she usually sits down  Relax and goes away.   Sometimes partial and other full visual change.   Mostly botton half of visual field is involved .  Not a lot  .   Always one eye and not the same but more frequently in right.   Had  Normal VF and  No plaques.   Noted on exam Currently she has no new symptoms some stress has questions about her health care maintenance. When she is due for colonoscopy he has been doing stool cards when she is due for bone density. She has bunions on her feet has some thickening of one nail on the right and asks opinion if this is a fungus and should be treated no pain no redness.   walks  [place] or so and feels fine.   Feels well; no limitations     Sleep 6.5- 7.5    ocass 8 hours some wakenings  And then go back to sleep .     Has aortic insufficiency with no symptoms followed by Dr. Antoine Poche.  due for a  followup in September last echo was September 2013   Hx of palpitation in the past   ROS: See pertinent positives and negatives per HPI. No syncope  racing heart numbness weakness some stress not enough sleep at times but generally well. Breathing has been okay although some of her cough is come back recently sees Dr. Maple Hudson no headaches claudication. Unusual fevers sweats or significant unintended weight loss  Remote history of Fosamax uncertain when last bone density was done  Past Medical History  Diagnosis Date  . Osteopenia   . Allergic rhinitis   . Clavicle fracture 3/08  . CHEST WALL PAIN, ACUTE 07/21/2010    Qualifier: Diagnosis of  By: Clent Ridges MD, Tera Mater     Family History  Problem Relation Age of Onset  . Scleroderma Mother   . Hyperlipidemia Mother   . Coronary artery disease Father 4    History   Social History  . Marital Status: Married    Spouse Name: N/A    Number of Children: N/A  . Years of Education: N/A   Occupational History  . Newspaper Jouralist    Social History Main  Topics  . Smoking status: Never Smoker   . Smokeless tobacco: Never Used  . Alcohol Use: None  . Drug Use: None  . Sexually Active: None   Other Topics Concern  . None   Social History Narrative   Married   hh of 2    p0g0   Works for [employer]     Outpatient Encounter Prescriptions as of 03/13/2013  Medication Sig Dispense Refill  . Calcium Carbonate-Vitamin D (CALCIUM PLUS VITAMIN D PO) Take 1 capsule by mouth daily.        [name] diltiazem (CARDIZEM CD) 120 MG 24 hr capsule Take 1 capsule (120 mg total) by mouth daily.  90 capsule  1  . LORazepam (ATIVAN) 0.5 MG tablet Take 0.5 mg by mouth as needed. For flight      . montelukast (SINGULAIR) 10 MG tablet Take 1 tablet (10 mg total) by mouth daily.  90 tablet  3  . Multiple Vitamins-Minerals (PRESERVISION AREDS PO) Take by mouth daily.       . [DISCONTINUED] ibuprofen (ADVIL,MOTRIN) 800 MG tablet Take 800 mg by mouth every 6  (six) hours as needed.         No facility-administered encounter medications on file as of 03/13/2013.    EXAM:  BP 124/80  Pulse 70  Temp(Src) 97.6 F (36.4 C) (Oral)  Wt 109 lb (49.442 kg)  BMI 17.86 kg/m2  SpO2 98%  Body mass index is 17.86 kg/(m^2).  GENERAL: vitals reviewed and listed above, alert, oriented, appears well hydrated and in no acute distress looks well cognitively intact  HEENT: atraumatic, conjunctiva  clear, no obvious abnormalities on inspection of external nose and ears EOMs are full nares slight congested face nontender OP : no lesion edema or exudate tongue appears midline  NECK: no obvious masses on inspection palpation no adenopathy I don't hear a bruit  LUNGS: clear to auscultation bilaterally, no wheezes, rales or rhonchi, good air movement  CV: HRRR, no clubbing cyanosis or  peripheral edema nl cap refill there is a 1-2/6 decrescendo diastolic murmur at the left upper sternal border that doesn't radiate no gallop PMI nondisplaced no clicks are heard Abdomen soft without organomegaly guarding or rebound MS: moves all extremities  good motor strength bunions bilaterally Skin no acute changes right second toe with a distal minimal thickening no redness no other deformity. PSYCH: pleasant and cooperative, no obvious depression or anxiety  ASSESSMENT AND PLAN:  Discussed the following assessment and plan:  Vision disturbance - Plan: CBC with Differential, Basic metabolic panel, Sedimentation rate, C-reactive protein, US Carotid Duplex Bilateral, Ambulatory referral to Neurology  Aortic insufficiency - Plan: Ambulatory referral to Neurology  Hypertrophic toenail - minor change  at this time monitor  rx not indicated fu if progressing or pain etc ,.  Macular degeneration  Bilateral bunions Dr. Dione Booze  concern possibility of a vascular ischemic cause. However this has been going off and on for 3 years with no physical finding which is reassuring. At  this time carotid Dopplers blood count sedimentation rate CRP discussed imaging evaluation. We'll get carotid Dopplers and have her see neurology can take aspirin in the meantime just in case.    Expectant management.  Patient states that she had her colonoscopy on time and was done at lab our and should be due soon but is uncertain when Reviewed the record in regard also to her bone density. -Patient advised to return or notify health care team  if symptoms  worsen or persist or new concerns arise.  Patient Instructions  We are going to arrange an ultrasound tests of the  Carotid arteries.    And neurology consult   May consider other imaging studies such as MRI view of the arteris of head and neck.  YOu will be notified of these appts.   get [place] a copy of the labs done through  Your wellness  program.   Can do 81 mg  Mg aspirin a day in the meantime  Every day non enteric coated might be better than the [facility] versions.  But probably either is ok as per guidelines  Labs today .  Will notify you  of labs when available. If vision symptoms are  Persistent progressive or you get any other sx of concern contact our medical team .     Neta Mends. Lorene Samaan M.D.  Patient  called  In march or so after i just returned out of office and  Was told that we didn't have the information and that I would call her  For appropriate follow up.  No phone message seen in EHR.   Order Summary: Bone Densitometry / [facility] -1.6 sp. -2.0 hip  Tell patient that bone density -1.6 spine and -2.0 in hip last one -1.4 and- 2.2. so this is stable and not sognificantly worse from 2008 . she may have a copy   Document Description: Append Order Summary: Bone Densitometry /  [facility] -1.6 sp. -2.0 hip  Pt aware of results      Results    Scan on 09/30/2010 12:00 AM by Madelin Headings, MDScan on 09/30/2010 12:00 AM by Madelin Headings, MD

## 2013-03-13 NOTE — Patient Instructions (Addendum)
We are going to arrange an ultrasound tests of the  Carotid arteries.    And neurology consult   May consider other imaging studies such as MRI view of the arteris of head and neck.  YOu will be notified of these appts.   get [place] a copy of the labs done through  Your wellness  program.   Can do 81 mg  Mg aspirin a day in the meantime  Every day non enteric coated might be better than the [facility] versions.  But probably either is ok as per guidelines  Labs today .  Will notify you  of labs when available. If vision symptoms are  Persistent progressive or you get any other sx of concern contact our medical team .

## 2013-03-14 ENCOUNTER — Encounter (INDEPENDENT_AMBULATORY_CARE_PROVIDER_SITE_OTHER): Payer: BC Managed Care – PPO

## 2013-03-14 DIAGNOSIS — I351 Nonrheumatic aortic (valve) insufficiency: Secondary | ICD-10-CM

## 2013-03-14 DIAGNOSIS — H53129 Transient visual loss, unspecified eye: Secondary | ICD-10-CM

## 2013-03-14 DIAGNOSIS — H539 Unspecified visual disturbance: Secondary | ICD-10-CM

## 2013-03-20 ENCOUNTER — Ambulatory Visit (INDEPENDENT_AMBULATORY_CARE_PROVIDER_SITE_OTHER): Payer: BC Managed Care – PPO | Admitting: Neurology

## 2013-03-20 ENCOUNTER — Encounter: Payer: Self-pay | Admitting: Neurology

## 2013-03-20 VITALS — BP 122/74 | HR 72 | Temp 97.6°F | Resp 18 | Wt 109.0 lb

## 2013-03-20 DIAGNOSIS — H539 Unspecified visual disturbance: Secondary | ICD-10-CM

## 2013-03-20 DIAGNOSIS — G453 Amaurosis fugax: Secondary | ICD-10-CM

## 2013-03-20 DIAGNOSIS — H3400 Transient retinal artery occlusion, unspecified eye: Secondary | ICD-10-CM

## 2013-03-20 NOTE — Progress Notes (Signed)
[name] [name] was seen today in neurologic consultation at the request of Lorretta Harp, MD.  The consultation is for the evaluation of visual change.  I reviewed prior historical records.  The first symptom began years ago (perhaps 2-3 yrs).  She states that rarely and sporadically, she will have vision change (blurry) predominately in the R eye.  She thinks that it occurs on the L occasionally but she is unsure.  It lasts about 3 minutes and then it goes away.  It has not happened in the last 3 weeks.  It generally will happen every few months but a few months ago she had a cluster of them, perhaps one time per week.  Generally, it was a horizontal line across the bottom of the visual field and below that line, it would be blurry.  If it happens in the R eye and she closes the right eye, then the sx goes away (so not a VF problem).  If she relaxes and calms down, it will go away.  She is unsure if it is stress related but feels that it could be.    She did see Dr. Dione Booze and she had normal visual fields, no plaque and no visual change from previous.  She had a carotid ultrasound in 03/14/2013.  There was 0-39% stenosis in the bilateral internal carotid arteries.  She has no headache.  Years ago, she did have hx of migraine but it seemed to get better after switching pillows over 10 years ago.  These vision changes unaccompanied by loss or alteration of consciousness, diplopia, speech difficulties, or lateralizing weakness or paresthesias.   Neuroimaging has not previously been performed.    PREVIOUS MEDICATIONS: n/a  ALLERGIES:   Allergies  Allergen Reactions  . Codeine Phosphate     REACTION: unspecified  . Meperidine Hcl     REACTION: unspecified    CURRENT MEDICATIONS:  Current Outpatient Prescriptions on File Prior to Visit  Medication Sig Dispense Refill  . Calcium Carbonate-Vitamin D (CALCIUM PLUS VITAMIN D PO) Take 1 capsule by mouth daily.        [name] diltiazem (CARDIZEM CD) 120 MG 24  hr capsule Take 1 capsule (120 mg total) by mouth daily.  90 capsule  1  . LORazepam (ATIVAN) 0.5 MG tablet Take 0.5 mg by mouth as needed. For flight      . montelukast (SINGULAIR) 10 MG tablet Take 1 tablet (10 mg total) by mouth daily.  90 tablet  3  . Multiple Vitamins-Minerals (PRESERVISION AREDS PO) Take by mouth daily.        No current facility-administered medications on file prior to visit.    PAST MEDICAL HISTORY:   Past Medical History  Diagnosis Date  . Osteopenia   . Allergic rhinitis   . Clavicle fracture 3/08  . CHEST WALL PAIN, ACUTE 07/21/2010    Qualifier: Diagnosis of  By: Clent Ridges MD, Tera Mater     PAST SURGICAL HISTORY:   Past Surgical History  Procedure Laterality Date  . Tonsillectomy and adenoidectomy      SOCIAL HISTORY:   History   Social History  . Marital Status: Married    Spouse Name: N/A    Number of Children: N/A  . Years of Education: N/A   Occupational History  . Newspaper Jouralist    Social History Main Topics  . Smoking status: Never Smoker   . Smokeless tobacco: Never Used  . Alcohol Use: Not on file  . Drug Use: Not  on file  . Sexually Active: Not on file   Other Topics Concern  . Not on file   Social History Narrative   Married   hh of 2    p0g0   Works for [employer]     FAMILY HISTORY:   Family Status  Relation Status Death Age  . Mother Deceased     scleroderma    ROS:  A complete 10 system review of systems was obtained and was unremarkable apart from what is mentioned above.  PHYSICAL EXAMINATION:    VITALS:   Filed Vitals:   03/20/13 0917  BP: 122/74  Pulse: 72  Temp: 97.6 F (36.4 C)  Resp: 18  Weight: 109 lb (49.442 kg)    GEN:  Normal appears female in no acute distress.  Appears stated age. HEENT:  Normocephalic, atraumatic. The mucous membranes are moist. The superficial temporal arteries are without ropiness or tenderness. Cardiovascular: Regular rate and rhythm. Lungs: Clear to  auscultation bilaterally. Neck/Heme: There are no carotid bruits noted bilaterally.  NEUROLOGICAL: Orientation:  The patient is alert and oriented x 3.  Fund of knowledge is appropriate.  Recent and remote memory intact.  Attention span and concentration normal.  Repeats and names without difficulty. Cranial nerves: There is good facial symmetry. The pupils are equal round and reactive to light bilaterally. Fundoscopic exam is attempted but the disc margins are not well visualized bilaterally. Extraocular muscles are intact and visual fields are full to confrontational testing. Speech is fluent and clear. Soft palate rises symmetrically and there is no tongue deviation. Hearing is intact to conversational tone. Tone: Tone is good throughout. Sensation: Sensation is intact to light touch and pinprick throughout (facial, trunk, extremities). Vibration is intact at the bilateral big toe. There is no extinction with double simultaneous stimulation. There is no sensory dermatomal level identified. Coordination:  The patient has no difficulty with RAM's or FNF bilaterally. Motor: Strength is 5/5 in the bilateral upper and lower extremities.  Shoulder shrug is equal and symmetric. There is no pronator drift.  There are no fasciculations noted. DTR's: Deep tendon reflexes are 2/4 at the bilateral biceps, triceps, brachioradialis, patella and achilles.  Plantar responses are downgoing bilaterally. Gait and Station: The patient is able to ambulate without difficulty. The patient is able to heel toe walk without any difficulty. The patient is able to ambulate in a tandem fashion. The patient is able to stand in the Romberg position.  TTE 06/2012  Study Conclusions  - Left ventricle: The cavity size was normal. Wall thickness was normal. The estimated ejection fraction was 60%. Wall motion was normal; there were no regional wall motion abnormalities. - Aortic valve: Sclerosis without stenosis.  Mild regurgitation.    IMPRESSION/PLAN  1. transient visual changes.  -Her carotid ultrasound was very normal.  Her transthoracic echo in September, 2013 was unrevealing.  -I am going to do an MRI and MRA of the brain just for completeness sake.  -I suspect that this might represent migraine without headache, although the duration of the episode is fairly short.  Nonetheless, I told her to see if she can identify any food triggers.  I also agree with the addition of aspirin, 81 mg, coated.  Risks, benefits, side effects and alternative therapies were discussed.  The opportunity to ask questions was given and they were answered to the best of my ability.  The patient expressed understanding and willingness to follow the outlined treatment protocols.  -I asked her  to call me for the results of testing.  I told her that if its unrevealing, we will follow up as needed or if new neurologic sx's arise.

## 2013-03-20 NOTE — Patient Instructions (Addendum)
1.  We will get you scheduled for an MRI and MRA of brain 2.  Call me a week after your MRI for the results 3.  If the results of the MRI are okay, then I will just see you as needed or if new problems arise  The MRI and MRA are scheduled for tomorrow, May 23 at 4:30pm at [facility] [address] [place], [place], [place] [phone]

## 2013-03-21 ENCOUNTER — Encounter: Payer: Self-pay | Admitting: Internal Medicine

## 2013-03-21 ENCOUNTER — Encounter: Payer: Self-pay | Admitting: Family Medicine

## 2013-03-21 NOTE — Progress Notes (Signed)
Quick Note:  Called and spoke with pt and pt is aware. ______ 

## 2013-03-24 ENCOUNTER — Encounter: Payer: Self-pay | Admitting: Internal Medicine

## 2013-03-25 ENCOUNTER — Telehealth: Payer: Self-pay | Admitting: Internal Medicine

## 2013-03-25 NOTE — Telephone Encounter (Signed)
Received 3 pages from [facility], sent to Dr. Fabian Sharp. 03/25/13/ss

## 2013-03-26 ENCOUNTER — Telehealth: Payer: Self-pay | Admitting: Neurology

## 2013-03-26 NOTE — Telephone Encounter (Signed)
Report sent to old fax number, they will send to current number and they do not include reports on the disk/online.

## 2013-03-26 NOTE — Telephone Encounter (Signed)
Tiffany  Will you get have [facility] send copy of their report?  I have scans but not reports for our records.  Ask them if they ever imbed the reports online or on CD because I could not find it there either.

## 2013-03-27 ENCOUNTER — Telehealth: Payer: Self-pay | Admitting: Neurology

## 2013-03-27 NOTE — Telephone Encounter (Signed)
Pt.notified

## 2013-03-27 NOTE — Telephone Encounter (Signed)
Please let pt know that her MRI and MRA were normal!

## 2013-04-10 ENCOUNTER — Encounter: Payer: Self-pay | Admitting: Neurology

## 2013-04-10 ENCOUNTER — Encounter (HOSPITAL_COMMUNITY): Payer: Self-pay | Admitting: Emergency Medicine

## 2013-04-10 ENCOUNTER — Emergency Department (HOSPITAL_COMMUNITY)
Admission: EM | Admit: 2013-04-10 | Discharge: 2013-04-11 | Disposition: A | Discharge status: Home / Self Care | Payer: Worker's Compensation | Attending: Emergency Medicine | Admitting: Emergency Medicine

## 2013-04-10 ENCOUNTER — Emergency Department (HOSPITAL_COMMUNITY): Payer: Worker's Compensation

## 2013-04-10 DIAGNOSIS — Z885 Allergy status to narcotic agent status: Secondary | ICD-10-CM | POA: Insufficient documentation

## 2013-04-10 DIAGNOSIS — M899 Disorder of bone, unspecified: Secondary | ICD-10-CM | POA: Insufficient documentation

## 2013-04-10 DIAGNOSIS — Z79899 Other long term (current) drug therapy: Secondary | ICD-10-CM | POA: Insufficient documentation

## 2013-04-10 DIAGNOSIS — Z888 Allergy status to other drugs, medicaments and biological substances status: Secondary | ICD-10-CM | POA: Insufficient documentation

## 2013-04-10 DIAGNOSIS — Y998 Other external cause status: Secondary | ICD-10-CM | POA: Insufficient documentation

## 2013-04-10 DIAGNOSIS — J309 Allergic rhinitis, unspecified: Secondary | ICD-10-CM | POA: Insufficient documentation

## 2013-04-10 DIAGNOSIS — IMO0002 Reserved for concepts with insufficient information to code with codable children: Secondary | ICD-10-CM | POA: Insufficient documentation

## 2013-04-10 DIAGNOSIS — W010XXA Fall on same level from slipping, tripping and stumbling without subsequent striking against object, initial encounter: Secondary | ICD-10-CM | POA: Insufficient documentation

## 2013-04-10 DIAGNOSIS — Y929 Unspecified place or not applicable: Secondary | ICD-10-CM | POA: Insufficient documentation

## 2013-04-10 DIAGNOSIS — I359 Nonrheumatic aortic valve disorder, unspecified: Secondary | ICD-10-CM | POA: Insufficient documentation

## 2013-04-10 DIAGNOSIS — Z8739 Personal history of other diseases of the musculoskeletal system and connective tissue: Secondary | ICD-10-CM | POA: Insufficient documentation

## 2013-04-10 DIAGNOSIS — Z7982 Long term (current) use of aspirin: Secondary | ICD-10-CM | POA: Insufficient documentation

## 2013-04-10 DIAGNOSIS — S8263XA Displaced fracture of lateral malleolus of unspecified fibula, initial encounter for closed fracture: Secondary | ICD-10-CM | POA: Insufficient documentation

## 2013-04-10 DIAGNOSIS — M949 Disorder of cartilage, unspecified: Secondary | ICD-10-CM | POA: Insufficient documentation

## 2013-04-10 DIAGNOSIS — S0081XA Abrasion of other part of head, initial encounter: Secondary | ICD-10-CM

## 2013-04-10 DIAGNOSIS — S82892A Other fracture of left lower leg, initial encounter for closed fracture: Secondary | ICD-10-CM

## 2013-04-10 HISTORY — DX: Nonrheumatic aortic (valve) insufficiency: I35.1

## 2013-04-10 NOTE — ED Notes (Signed)
PT. TRIPPED AND FELL THIS EVENING , TWISTED LEFT ANKLE / HIT HER FACE AGAINST PAVEMENT , NO LOC , PRESENTS WITH LEFT ANKLE SWELLING / SUPERFICIAL ABRASION AT BRIDGE OF NOSE , DENIES NECK PAIN , ALERT AND ORIENTED.

## 2013-04-10 NOTE — ED Notes (Signed)
Ice placed on ankle

## 2013-04-10 NOTE — ED Provider Notes (Signed)
History   CSN: [id number] Arrival date & time 04/10/13  2142 First MD Initiated Contact with Patient 04/10/13 2347      Chief Complaint  Patient presents with  . Fall    HPI  Patient presents to the emergency room after a fall this evening. Patient was walking when she tripped and fell. She twisted her left ankle. She hit her face and scraped her nose and upper lip against the ground. Patient denies any loss of consciousness. She does not have any neck pain. She denies any difficulty breathing. She denies any malocclusion. She thinks her teeth are fine. Patient denies any pain in her knee.  The pain in the ankle is moderate in nature. It increases with palpation and movement. She denies any distal numbness or weakness. Past Medical History  Diagnosis Date  . Osteopenia   . Allergic rhinitis   . Clavicle fracture 3/08  . CHEST WALL PAIN, ACUTE 07/21/2010    Qualifier: Diagnosis of  By: Clent Ridges MD, Tera Mater   . Aortic insufficiency     Past Surgical History  Procedure Laterality Date  . Tonsillectomy and adenoidectomy    . Laparoscopic salpingo oopherectomy    . Appendectomy    . Abdominal hysterectomy      Family History  Problem Relation Age of Onset  . Scleroderma Mother   . Hyperlipidemia Mother   . Coronary artery disease Father 29    History  Substance Use Topics  . Smoking status: Never Smoker   . Smokeless tobacco: Never Used  . Alcohol Use: No     Comment: never    OB History   Grav Para Term Preterm Abortions TAB SAB Ect Mult Living                  Review of Systems  All other systems reviewed and are negative.    Allergies  Codeine phosphate and Meperidine hcl  Home Medications   Current Outpatient Rx  Name  Route  Sig  Dispense  Refill  . aspirin EC 81 MG tablet   Oral   Take 81 mg by mouth daily.         . Calcium Carbonate-Vitamin D (CALCIUM PLUS VITAMIN D PO)   Oral   Take 1 tablet by mouth daily.          [name] diltiazem (CARDIZEM CD)  120 MG 24 hr capsule   Oral   Take 1 capsule (120 mg total) by mouth daily.   90 capsule   1   . LORazepam (ATIVAN) 0.5 MG tablet   Oral   Take 0.5 mg by mouth 3 (three) times daily as needed for anxiety.          . montelukast (SINGULAIR) 10 MG tablet   Oral   Take 1 tablet (10 mg total) by mouth daily.   90 tablet   3   . Multiple Vitamins-Minerals (PRESERVISION AREDS PO)   Oral   Take 1 tablet by mouth daily.          [name] HYDROcodone-acetaminophen (NORCO) 5-325 MG per tablet   Oral   Take 1 tablet by mouth every 6 (six) hours as needed for pain.   20 tablet   0     BP 129/83  Pulse 78  Temp(Src) 98.4 F (36.9 C) (Oral)  Resp 16  SpO2 97%  Physical Exam  Nursing note and vitals reviewed. Constitutional: She appears well-developed and well-nourished. No distress.  HENT:  Head: Normocephalic and  atraumatic.  Right Ear: External ear normal.  Left Ear: External ear normal.  Abrasion to the nose and upper lips, small contusion to the mucosal surface of the upper lip, no dental injury noted, no malocclusion, no tenderness to palpation mandible  or maxillary bones  Eyes: Conjunctivae are normal. Right eye exhibits no discharge. Left eye exhibits no discharge. No scleral icterus.  Neck: Neck supple. No tracheal deviation present.  Cardiovascular: Normal rate.   Pulmonary/Chest: Effort normal. No stridor. No respiratory distress.  Musculoskeletal: She exhibits no edema.       Left ankle: She exhibits swelling and ecchymosis. She exhibits no laceration. Tenderness. Lateral malleolus and head of 5th metatarsal tenderness found. No proximal fibula tenderness found. Achilles tendon normal.  Neurological: She is alert. Cranial nerve deficit: no gross deficits.  Skin: Skin is warm and dry. No rash noted.  Psychiatric: She has a normal mood and affect.    ED Course  Procedures (including critical care time)  Labs Reviewed - No data to display Dg Ankle Complete  Left  04/10/2013   *RADIOLOGY REPORT*  Clinical Data: Lateral pain and swelling about the left ankle after fall.  LEFT ANKLE COMPLETE - 3+ VIEW  Comparison: None.  Findings: There is prominent lateral soft tissue swelling about the left ankle.  Tiny bone fragment inferior to the lateral malleolus may represent a small avulsion fragment.  The ankle is otherwise intact in appearance.  The ankle mortis and talar dome are intact. There is fragmentation of the base of the fifth metatarsal bone, incompletely visualized on this study.  No acute fracture here is not excluded.  Mild soft tissue swelling at this location as well. No radiopaque soft tissue foreign bodies.  IMPRESSION: Lateral soft tissue swelling about the left ankle with suggestion of tiny avulsion of the lateral malleolus.  Bone fragment at the base of the fifth metatarsal bone may represent additional fracture.   Original Report Authenticated By: Burman Nieves, M.D.     1. Facial abrasion, initial encounter   2. Ankle fracture, left, closed, initial encounter       MDM  I doubt facial bone fracture. The patient's x-ray findings of her left ankle are noted.  The patient will be placed in a Cam Walker and crutches.  Followup with orthopedics.       Celene Kras, MD 04/12/13 [phone]

## 2013-04-11 MED ORDER — HYDROCODONE-ACETAMINOPHEN 5-325 MG PO TABS: 1.0000 | ORAL_TABLET | Freq: Four times a day (QID) | ORAL | Status: DC | PRN

## 2013-04-11 NOTE — ED Notes (Signed)
Pt ambulating with assistance of crutches on d/c in no acute distress, A&Ox4. D/c instructions reviewed w/ pt and family - pt and family deny any further questions or concerns at present. Rx given x1, Pt instructed to not use alcohol, drive, or operate heavy machinery while take the prescription pain medications as they could make him drowsy - pt verbalized understanding.

## 2013-04-11 NOTE — Progress Notes (Signed)
Orthopedic Tech Progress Note Patient Details:  [name] [name] 03-14-1951 [id number]  Ortho Devices Type of Ortho Device: Crutches;CAM walker   Haskell Flirt 04/11/2013, 12:41 AM

## 2013-04-20 ENCOUNTER — Encounter: Payer: Self-pay | Admitting: Internal Medicine

## 2013-05-13 ENCOUNTER — Telehealth: Payer: Self-pay | Admitting: Internal Medicine

## 2013-05-13 NOTE — Telephone Encounter (Signed)
Patient Information:  Caller Name: Jaysha  Phone: [phone]  Patient: [name] [name] Alias  Gender: Female  DOB: 09-04-1951  Age: 62 Years  PCP: Berniece Andreas (Family Practice)  Office Follow Up:  Does the office need to follow up with this patient?: No  Instructions For The Office: N/A   Symptoms  Reason For Call & Symptoms: PT calling regarding  new onset of Plantar Wart on left foot. Told by Physical Therapist.  Reviewed Health History In EMR: Yes  Reviewed Medications In EMR: Yes  Reviewed Allergies In EMR: Yes  Reviewed Surgeries / Procedures: Yes  Date of Onset of Symptoms: 05/11/2013  Guideline(s) Used:  Foot Pain  Disposition Per Guideline:   See Within 3 Days in Office  Reason For Disposition Reached:   Patient wants to be seen  Advice Given:  N/A  Patient Will Follow Care Advice:  YES  Appointment Scheduled:  05/14/2013 15:00:00 Appointment Scheduled Provider:  Berniece Andreas (Family Practice)

## 2013-05-14 ENCOUNTER — Ambulatory Visit (INDEPENDENT_AMBULATORY_CARE_PROVIDER_SITE_OTHER): Payer: BC Managed Care – PPO | Admitting: Internal Medicine

## 2013-05-14 ENCOUNTER — Encounter: Payer: Self-pay | Admitting: Internal Medicine

## 2013-05-14 VITALS — BP 118/66 | HR 69 | Temp 98.0°F | Wt 108.0 lb

## 2013-05-14 DIAGNOSIS — B070 Plantar wart: Secondary | ICD-10-CM

## 2013-05-14 DIAGNOSIS — L84 Corns and callosities: Secondary | ICD-10-CM | POA: Insufficient documentation

## 2013-05-14 DIAGNOSIS — R0989 Other specified symptoms and signs involving the circulatory and respiratory systems: Secondary | ICD-10-CM

## 2013-05-14 DIAGNOSIS — J309 Allergic rhinitis, unspecified: Secondary | ICD-10-CM

## 2013-05-14 DIAGNOSIS — R0982 Postnasal drip: Secondary | ICD-10-CM

## 2013-05-14 DIAGNOSIS — J329 Chronic sinusitis, unspecified: Secondary | ICD-10-CM

## 2013-05-14 MED ORDER — FLUTICASONE PROPIONATE 50 MCG/ACT NA SUSP: NASAL | Status: DC

## 2013-05-14 NOTE — Patient Instructions (Addendum)
This is  Either  A wart most likely or callous  .  dont want the treatment to be worse than the condition that will eventually resolve.   Soak  To soften  Up area and then apply otc  Topical salicylic acid topical usually.   Every noight skin will turn white and peel.  May take  4 weeds to resolve.   Stay on the singular add generic Flonase  For at least 2-3 weeks  To see if helps. The congestion.     Plantar Wart Warts are benign (noncancerous) growths of the outer skin layer. They can occur at any time in life but are most common during childhood and the teen years. Warts can occur on many skin surfaces of the body. When they occur on the underside (sole) of your foot they are called plantar warts. They often emerge in groups with several small warts encircling a larger growth. CAUSES  Human papillomavirus (HPV) is the cause of plantar warts. HPV attacks a break in the skin of the foot. Walking barefoot can lead to exposure to the wart virus. Plantar warts tend to develop over areas of pressure such as the heel and ball of the foot. Plantar warts often grow into the deeper layers of skin. They may spread to other areas of the sole but cannot spread to other areas of the body. SYMPTOMS  You may also notice a growth on the undersurface of your foot. The wart may grow directly into the sole of the foot, or rise above the surface of the skin on the sole of the foot, or both. They are most often flat from pressure. Warts generally do not cause itching but may cause pain in the area of the wart when you put weight on your foot. DIAGNOSIS  Diagnosis is made by physical examination. This means your caregiver discovers it while examining your foot.  TREATMENT  There are many ways to treat plantar warts. However, warts are very tough. Sometimes it is difficult to treat them so that they go away completely and do not grow back. Any treatment must be done regularly to work. If left untreated, most plantar  warts will eventually disappear over a period of one to two years. Treatments you can do at home include:  Putting duct tape over the top of the wart (occlusion), has been found to be effective over several months. The duct tape should be removed each night and reapplied until the wart has disappeared.  Placing over-the-counter medications on top of the wart to help kill the wart virus and remove the wart tissue (salicylic acid, cantharidin, and dichloroacetic acid ) are useful. These are called keratolytic agents. These medications make the skin soft and gradually layers will shed away. Theses compounds are usually placed on the wart each night and then covered with a band-aid. They are also available in pre-medicated band-aid form. Avoid surrounding skin when applying these liquids as these medications can burn healthy skin. The treatment may take several months of nightly use to be effective.  Cryotherapy to freeze the wart has recently become available over-the-counter for children 4 years and older. This system makes use of a soft narrow applicator connected to a bottle of compressed cold liquid that is applied directly to the wart. This medication can burn health skin and should be used with caution.  As with all over-the-counter medications, read the directions carefully before use. Treatments generally done in your caregiver's office include:  Some aggressive treatments  may cause discomfort, discoloration and scaring of the surrounding skin. The risks and benefits of treatment should be discussed with your caregiver.  Freezing the wart with liquid nitrogen (cryotherapy, see above).  Burning the wart with use of very high heat (cautery).  Injecting medication into the wart.  Surgically removing or laser treatment of the wart.  Your caregiver may refer you to a dermatologist for difficult to treat, large sized or large numbers of warts. HOME CARE INSTRUCTIONS   Soak the affected area  in warm water. Dry the area completely when you are done. Remove the top layer of softened skin, then apply the chosen topical medication and reapply a bandage.  Remove the bandage daily and file excess wart tissue (pumice stone works well for this purpose). Repeat the entire process daily or every other day for weeks until the plantar wart disappears.  Several brands of salicylic acid pads are available as over-the-counter remedies.  Pain can be relieved by wearing a doughnut bandage. This is a bandage with a hole in it. The bandage is put on with the hole over the wart. This helps take the pressure off the wart and gives pain relief. To help prevent plantar warts:  Wear shoes and socks and change them daily.  Keep feet clean and dry.  Check your feet and your children's feet regularly.  Avoid direct contact with warts on other people.  Have growths, or changes on your skin checked by your caregiver. Document Released: 01/06/2004 Document Revised: 01/08/2012 Document Reviewed: 06/16/2009 [facility] Patient Information 2014 [place], [place].

## 2013-05-14 NOTE — Progress Notes (Signed)
Chief Complaint  Patient presents with  . Plantar Warts    Bottom of left foot.    HPI: Patient comes in today for SDA for  new problem evaluation. Couple of issues. She had an unfortunate fall on her face and hurt her left ankle. She's had a boot on her left foot for a couple of weeks and then it was off and had an evaluation and physical therapy. Physical therapist noted there was a bump on her foot that looks like a wart. She's never had one of these. If she has one she was concerned that she got it at the physical therapy place where she had to walk barefoot on well-traveled rub. Left foot is getting better does have callus on the bottom of her foot but not as much as on her right  Also has cough and congestion f since the end of June. He is getting somewhat better has tried different over-the-counter 6 hard to get mucus up no shortness of breath mild upper respiratory symptoms much resolved no fever is still on Singulair.  Bump area.   Lip right upper from her fall not really tender but can still feel it asks to check it.  ROS: See pertinent positives and negatives per HPI. No current chest pain shortness of breath no recurrence of the vision disturbance he had an MRI/MRA which was normal with a anxiety producing experience.    Past Medical History  Diagnosis Date  . Osteopenia   . Allergic rhinitis   . Clavicle fracture 3/08  . CHEST WALL PAIN, ACUTE 07/21/2010    Qualifier: Diagnosis of  By: Clent Ridges MD, Tera Mater   . Aortic insufficiency   . CAT SCRATCH 09/10/2009    Qualifier: Diagnosis of  By: Fabian Sharp MD, Neta Mends   . Vision disturbance 03/13/2013    Negative neuro MRA MRI    Family History  Problem Relation Age of Onset  . Scleroderma Mother   . Hyperlipidemia Mother   . Coronary artery disease Father 98    History   Social History  . Marital Status: Married    Spouse Name: N/A    Number of Children: N/A  . 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 Smoking status: Never Smoker   . Smokeless tobacco: Never Used  . Alcohol Use: No     Comment: never  . Drug Use: No  . Sexually Active: None   Other Topics Concern  . None   Social History Narrative   Married   hh of 2    p0g0   Works for [employer]     Outpatient Encounter Prescriptions as of 05/14/2013  Medication Sig Dispense Refill  . aspirin EC 81 MG tablet Take 81 mg by mouth daily.      . Calcium Carbonate-Vitamin D (CALCIUM PLUS VITAMIN D PO) Take 1 tablet by mouth daily.       [name] diltiazem (CARDIZEM CD) 120 MG 24 hr capsule Take 1 capsule (120 mg total) by mouth daily.  90 capsule  1  . montelukast (SINGULAIR) 10 MG tablet Take 1 tablet (10 mg total) by mouth daily.  90 tablet  3  . Multiple Vitamins-Minerals (PRESERVISION AREDS PO) Take 1 tablet by mouth daily.       . fluticasone (FLONASE) 50 MCG/ACT nasal spray 2 spray each nostril qd  16 g  3  . LORazepam (ATIVAN) 0.5  MG tablet Take 0.5 mg by mouth 3 (three) times daily as needed for anxiety.       . [DISCONTINUED] HYDROcodone-acetaminophen (NORCO) 5-325 MG per tablet Take 1 tablet by mouth every 6 (six) hours as needed for pain.  20 tablet  0  . [DISCONTINUED] ibuprofen (ADVIL,MOTRIN) 800 MG tablet        No facility-administered encounter medications on file as of 05/14/2013.    EXAM:  BP 118/66  Pulse 69  Temp(Src) 98 F (36.7 C) (Oral)  Wt 108 lb (48.988 kg)  BMI 17.69 kg/m2  SpO2 98%  Body mass index is 17.69 kg/(m^2).  GENERAL: vitals reviewed and listed above, alert, oriented, appears well hydrated and in no acute distress  HEENT: atraumatic, conjunctiva  clear, no obvious abnormalities on inspection of external nose and ears upper lip right minimal erythema at the top middle slight thickening on palpation no foreign body obvious nares slight congestion face nontender OP : no lesion edema or exudate  NECK: no obvious masses on inspection palpation   LUNGS: clear to auscultation bilaterally, no wheezes, rales or rhonchi, good air movement CV: HRRR, no clubbing cyanosis or  peripheral edema nl cap refill  MS: moves all extremities bilateral bunions left greater than right left ankle with swelling. Callus mid metatarsal area at the base of this there is a 3 mm hypertrophic lesion with the center no skin markings there. It is nontender and there is no redness.  PSYCH: pleasant and cooperative, no obvious depression or anxiety  ASSESSMENT AND PLAN:  Discussed the following assessment and plan:  Plantar wart of left foot - Versus a corn or callus mid foot. Discussed options risk-benefit of treatment  Foot callus  Chest congestion - Appears to be resolving URI probably has underlying allergy empiric add on a Flonase follow up if persistent progressive no alarm findings  Allergic rhinitis due to allergen  Post-nasal drainage Total visit > 50% spent counseling and coordinating care  Left ankle injury still swollen under rehabilitation workers comp related.    -Patient advised to return or notify health care team  if symptoms worsen or persist or new concerns arise.  Patient Instructions  This is  Either  A wart most likely or callous  .  dont want the treatment to be worse than the condition that will eventually resolve.   Soak  To soften  Up area and then apply otc  Topical salicylic acid topical usually.   Every noight skin will turn white and peel.  May take  4 weeds to resolve.   Stay on the singular add generic Flonase  For at least 2-3 weeks  To see if helps. The congestion.     Plantar Wart Warts are benign (noncancerous) growths of the outer skin layer. They can occur at any time in life but are most common during childhood and the teen years. Warts can occur on many skin surfaces of the body. When they occur on the underside (sole) of your foot they are called plantar warts. They often emerge in groups with several  small warts encircling a larger growth. CAUSES  Human papillomavirus (HPV) is the cause of plantar warts. HPV attacks a break in the skin of the foot. Walking barefoot can lead to exposure to the wart virus. Plantar warts tend to develop over areas of pressure such as the heel and ball of the foot. Plantar warts often grow into the deeper layers of skin. They may spread to other  areas of the sole but cannot spread to other areas of the body. SYMPTOMS  You may also notice a growth on the undersurface of your foot. The wart may grow directly into the sole of the foot, or rise above the surface of the skin on the sole of the foot, or both. They are most often flat from pressure. Warts generally do not cause itching but may cause pain in the area of the wart when you put weight on your foot. DIAGNOSIS  Diagnosis is made by physical examination. This means your caregiver discovers it while examining your foot.  TREATMENT  There are many ways to treat plantar warts. However, warts are very tough. Sometimes it is difficult to treat them so that they go away completely and do not grow back. Any treatment must be done regularly to work. If left untreated, most plantar warts will eventually disappear over a period of one to two years. Treatments you can do at home include:  Putting duct tape over the top of the wart (occlusion), has been found to be effective over several months. The duct tape should be removed each night and reapplied until the wart has disappeared.  Placing over-the-counter medications on top of the wart to help kill the wart virus and remove the wart tissue (salicylic acid, cantharidin, and dichloroacetic acid ) are useful. These are called keratolytic agents. These medications make the skin soft and gradually layers will shed away. Theses compounds are usually placed on the wart each night and then covered with a band-aid. They are also available in pre-medicated band-aid form. Avoid  surrounding skin when applying these liquids as these medications can burn healthy skin. The treatment may take several months of nightly use to be effective.  Cryotherapy to freeze the wart has recently become available over-the-counter for children 4 years and older. This system makes use of a soft narrow applicator connected to a bottle of compressed cold liquid that is applied directly to the wart. This medication can burn health skin and should be used with caution.  As with all over-the-counter medications, read the directions carefully before use. Treatments generally done in your caregiver's office include:  Some aggressive treatments may cause discomfort, discoloration and scaring of the surrounding skin. The risks and benefits of treatment should be discussed with your caregiver.  Freezing the wart with liquid nitrogen (cryotherapy, see above).  Burning the wart with use of very high heat (cautery).  Injecting medication into the wart.  Surgically removing or laser treatment of the wart.  Your caregiver may refer you to a dermatologist for difficult to treat, large sized or large numbers of warts. HOME CARE INSTRUCTIONS   Soak the affected area in warm water. Dry the area completely when you are done. Remove the top layer of softened skin, then apply the chosen topical medication and reapply a bandage.  Remove the bandage daily and file excess wart tissue (pumice stone works well for this purpose). Repeat the entire process daily or every other day for weeks until the plantar wart disappears.  Several brands of salicylic acid pads are available as over-the-counter remedies.  Pain can be relieved by wearing a doughnut bandage. This is a bandage with a hole in it. The bandage is put on with the hole over the wart. This helps take the pressure off the wart and gives pain relief. To help prevent plantar warts:  Wear shoes and socks and change them daily.  Keep feet clean and  dry.  Check your feet and your children's feet regularly.  Avoid direct contact with warts on other people.  Have growths, or changes on your skin checked by your caregiver. Document Released: 01/06/2004 Document Revised: 01/08/2012 Document Reviewed: 06/16/2009 [facility] Patient Information 2014 [place], [place].      Neta Mends. Laneisha Mino M.D.

## 2013-06-13 ENCOUNTER — Encounter: Payer: Self-pay | Admitting: Internal Medicine

## 2013-06-13 LAB — HM MAMMOGRAPHY

## 2013-07-31 ENCOUNTER — Encounter: Payer: Self-pay | Admitting: Cardiology

## 2013-07-31 ENCOUNTER — Ambulatory Visit (INDEPENDENT_AMBULATORY_CARE_PROVIDER_SITE_OTHER): Payer: BC Managed Care – PPO | Admitting: Cardiology

## 2013-07-31 VITALS — BP 140/80 | HR 69 | Ht 65.0 in | Wt 108.0 lb

## 2013-07-31 DIAGNOSIS — I359 Nonrheumatic aortic valve disorder, unspecified: Secondary | ICD-10-CM

## 2013-07-31 DIAGNOSIS — R002 Palpitations: Secondary | ICD-10-CM

## 2013-07-31 DIAGNOSIS — I351 Nonrheumatic aortic (valve) insufficiency: Secondary | ICD-10-CM

## 2013-07-31 MED ORDER — DILTIAZEM HCL ER COATED BEADS 120 MG PO CP24: 120.0000 mg | ORAL_CAPSULE | Freq: Every day | ORAL | Status: DC

## 2013-07-31 NOTE — Progress Notes (Signed)
HPI The patient presents for follow up of palpitations.  She also has aortic insufficiency. Since I last saw her she has done well. She did sprain her ankle this summer and was limited because of this. However, she is now completed physical therapy.The patient denies any new symptoms such as chest discomfort, neck or arm discomfort. There has been no new shortness of breath, PND or orthopnea. There have been no reported palpitations, presyncope or syncope.  .  She does exercise.  She rarely feels her heart beating a little stronger for a perior of time but has no limitations with this.  Allergies  Allergen Reactions  . Codeine Phosphate     REACTION: unspecified  . Meperidine Hcl     REACTION: unspecified    Current Outpatient Prescriptions  Medication Sig Dispense Refill  . aspirin EC 81 MG tablet Take 81 mg by mouth daily.      . Calcium Carbonate-Vitamin D (CALCIUM PLUS VITAMIN D PO) Take 1 tablet by mouth daily.       [name] diltiazem (CARDIZEM CD) 120 MG 24 hr capsule Take 1 capsule (120 mg total) by mouth daily.  90 capsule  1  . fluticasone (FLONASE) 50 MCG/ACT nasal spray 2 spray each nostril qd  16 g  3  . LORazepam (ATIVAN) 0.5 MG tablet Take 0.5 mg by mouth 3 (three) times daily as needed for anxiety.       . montelukast (SINGULAIR) 10 MG tablet Take 1 tablet (10 mg total) by mouth daily.  90 tablet  3  . Multiple Vitamins-Minerals (PRESERVISION AREDS PO) Take 1 tablet by mouth daily.        No current facility-administered medications for this visit.    Past Medical History  Diagnosis Date  . Osteopenia   . Allergic rhinitis   . Clavicle fracture 3/08  . CHEST WALL PAIN, ACUTE 07/21/2010    Qualifier: Diagnosis of  By: Clent Ridges MD, Tera Mater   . Aortic insufficiency   . CAT SCRATCH 09/10/2009    Qualifier: Diagnosis of  By: Fabian Sharp MD, Neta Mends   . Vision disturbance 03/13/2013    Negative neuro MRA MRI    Past Surgical History  Procedure Laterality Date  . Tonsillectomy and  adenoidectomy    . Laparoscopic salpingo oopherectomy    . Appendectomy    . Abdominal hysterectomy      ROS:  Positive for palpitations, seasonal allergies, varicose veins. Otherwise as stated in the HPI and negative for all other systems.  PHYSICAL EXAM BP 140/80  Pulse 69  Ht 5\' 5"  (1.651 m)  Wt 108 lb (48.988 kg)  BMI 17.97 kg/m2 GENERAL:  Well appearing NECK:  No jugular venous distention, waveform within normal limits, carotid upstroke brisk and symmetric, no bruits, no thyromegaly LUNGS:  Clear to auscultation bilaterally BACK:  No CVA tenderness CHEST:  Unremarkable HEART:  PMI not displaced or sustained,S1 and S2 within normal limits, no S3, no S4, no clicks, no rubs, diastolic murmur mid to late at the left 3rd intercostal space. ABD:  Flat, positive bowel sounds normal in frequency in pitch, no bruits, no rebound, no guarding, no midline pulsatile mass, no hepatomegaly, no splenomegaly EXT:  2 plus pulses throughout, no edema, no cyanosis no clubbing  EKG:  Sinus rhythm, rate 69, axis within normal limits, intervals within normal limits, no acute ST-T wave changes.  07/31/2013  ASSESSMENT AND PLAN  Aortic insufficiency -  Her AI is mild by echo last year.  There has been no change in her exam or new symptoms. I will plan an echocardiogram next year for routine followup.  Palpitations -  These are stable. she will continue the meds as listed.  ELEVATED BLOOD PRESSURE WITHOUT DIAGNOSIS OF HYPERTENSION -  Her BP at home is ok.  Previous blood pressure readings have been fine. No change in therapy is indicated.

## 2013-07-31 NOTE — Patient Instructions (Addendum)
The current medical regimen is effective;  continue present plan and medications.  Your physician has requested that you have an echocardiogram in one year. Echocardiography is a painless test that uses sound waves to create images of your heart. It provides your doctor with information about the size and shape of your heart and how well your heart's chambers and valves are working. This procedure takes approximately one hour. There are no restrictions for this procedure.  Follow up in 1 year with Dr Antoine Poche.  You will receive a letter in the mail 2 months before you are due.  Please call us when you receive this letter to schedule your follow up appointment.

## 2013-09-02 ENCOUNTER — Encounter: Payer: Self-pay | Admitting: Internal Medicine

## 2013-09-03 ENCOUNTER — Telehealth: Payer: Self-pay | Admitting: Internal Medicine

## 2013-09-03 NOTE — Telephone Encounter (Signed)
Pt needs a lipid panel done b/c she needs for insurance purposes for a lower rate. Pt had all other labs done 5/15 , but not a lipid panel. Pt would like to know if she could get that done here? pls advise.

## 2013-09-03 NOTE — Telephone Encounter (Signed)
Pt was able to get these labs done at her office. pls disregard request. Thanks!

## 2013-09-03 NOTE — Telephone Encounter (Signed)
See Pt Advise Request in your in basket for further information.

## 2013-09-04 ENCOUNTER — Other Ambulatory Visit: Payer: Self-pay

## 2013-09-21 ENCOUNTER — Encounter: Payer: Self-pay | Admitting: Internal Medicine

## 2013-10-02 ENCOUNTER — Encounter: Payer: Self-pay | Admitting: Internal Medicine

## 2013-10-02 ENCOUNTER — Ambulatory Visit (INDEPENDENT_AMBULATORY_CARE_PROVIDER_SITE_OTHER): Payer: BC Managed Care – PPO | Admitting: Internal Medicine

## 2013-10-02 VITALS — BP 130/80 | HR 71 | Temp 97.7°F | Wt 109.0 lb

## 2013-10-02 DIAGNOSIS — M21619 Bunion of unspecified foot: Secondary | ICD-10-CM

## 2013-10-02 DIAGNOSIS — M21611 Bunion of right foot: Secondary | ICD-10-CM

## 2013-10-02 DIAGNOSIS — L84 Corns and callosities: Secondary | ICD-10-CM

## 2013-10-02 DIAGNOSIS — Z23 Encounter for immunization: Secondary | ICD-10-CM

## 2013-10-02 DIAGNOSIS — Z2911 Encounter for prophylactic immunotherapy for respiratory syncytial virus (RSV): Secondary | ICD-10-CM

## 2013-10-02 NOTE — Patient Instructions (Signed)
i thoink the callous area  If pressure poiint and i dont see  warrt today . If no pain or inflammation and no growing  Can observe.  If not see podiatry  Cholesterol is good  Continue lifestyle intervention healthy eating and exercise . Shingles vaccine today .  [name]

## 2013-10-02 NOTE — Progress Notes (Signed)
Chief Complaint  Patient presents with  . Plantar Warts    Left foot lab review ? shingles vaccine    HPI: Patient comes in today for SDA for  problem evaluation. Has been using otc plantar waret topical left foot in callous area mid foot  Noted by pt at rehab?  Currently cant tell if should take other action etc. No current pain swelling   Ankle is better  Bunions not really bothering her at this time. Asks about singles vacc has had shingles when  Younger  Has had flu vaccine has docdumetation Had lab screening at work wellness program  Tc ratio 2.7  ROS: See pertinent positives and negatives per HPI.  Past Medical History  Diagnosis Date  . Osteopenia   . Allergic rhinitis   . Clavicle fracture 3/08  . CHEST WALL PAIN, ACUTE 07/21/2010    Qualifier: Diagnosis of  By: Clent Ridges MD, Tera Mater   . Aortic insufficiency   . CAT SCRATCH 09/10/2009    Qualifier: Diagnosis of  By: Fabian Sharp MD, Neta Mends   . Vision disturbance 03/13/2013    Negative neuro MRA MRI    Family History  Problem Relation Age of Onset  . Scleroderma Mother   . Hyperlipidemia Mother   . Coronary artery disease Father 16    History   Social History  . Marital Status: Married    Spouse Name: N/A    Number of Children: N/A  . Years of Education: N/A   Occupational History  . Newspaper Jouralist    Social History Main Topics  . Smoking status: Never Smoker   . Smokeless tobacco: Never Used  . Alcohol Use: No     Comment: never  . Drug Use: No  . Sexual Activity: None   Other Topics Concern  . None   Social History Narrative   Married   hh of 2    p0g0   Works for [employer]     Outpatient Encounter Prescriptions as of 10/02/2013  Medication Sig  . aspirin EC 81 MG tablet Take 81 mg by mouth daily.  . Calcium Carbonate-Vitamin D (CALCIUM PLUS VITAMIN D PO) Take 1 tablet by mouth daily.   [name] diltiazem (CARDIZEM CD) 120 MG 24 hr capsule Take 1 capsule (120 mg total) by mouth daily.  .  fluticasone (FLONASE) 50 MCG/ACT nasal spray 2 spray each nostril qd  . LORazepam (ATIVAN) 0.5 MG tablet Take 0.5 mg by mouth 3 (three) times daily as needed for anxiety.   . montelukast (SINGULAIR) 10 MG tablet Take 1 tablet (10 mg total) by mouth daily.  . Multiple Vitamins-Minerals (PRESERVISION AREDS PO) Take 1 tablet by mouth daily.     EXAM:  BP 130/80  Pulse 71  Temp(Src) 97.7 F (36.5 C)  Wt 109 lb (49.442 kg)  SpO2 97%  Body mass index is 18.14 kg/(m^2).  GENERAL: vitals reviewed and listed above, alert, oriented, appears well hydrated and in no acute distress  HEENT: atraumatic, conjunctiva  clear, no obvious abnormalities on inspection of external nose and ears  MS: moves all extremities bilateral bunion left foot mid metatarsal callous  No redness and no cneter wartry area noted  No tenderness has callus on right foot less prominent  PSYCH: pleasant and cooperative, no obvious depression or anxiety Reviewed  TC 214 hdl80 ldl 124  data  ASSESSMENT AND PLAN:  Discussed the following assessment and plan:  Foot callus  Bilateral bunions  Need for shingles vaccine -  Plan: Varicella-zoster vaccine subcutaneous don't see wart at this time  Would follow and if  persistent or progressive sx  Can see  podaitry  i think this is callous from foot mechanics and not bothering her at this time. Labs reviewed  Advise get shingles vaccine .   Risk benefit discussed. -Patient advised to return or notify health care team  if symptoms worsen or persist or new concerns arise.  Patient Instructions  i thoink the callous area  If pressure poiint and i dont see  warrt today . If no pain or inflammation and no growing  Can observe.  If not see podiatry  Cholesterol is good  Continue lifestyle intervention healthy eating and exercise . Shingles vaccine today .  [name]   Neta Mends. [name] M.D.  Pre visit review using our clinic review tool, if applicable. No additional management support  is needed unless otherwise documented below in the visit note. Total visit > 50% spent counseling and coordinating care

## 2014-02-19 ENCOUNTER — Ambulatory Visit (INDEPENDENT_AMBULATORY_CARE_PROVIDER_SITE_OTHER): Payer: BC Managed Care – PPO | Admitting: Internal Medicine

## 2014-02-19 ENCOUNTER — Ambulatory Visit: Payer: BC Managed Care – PPO | Admitting: Internal Medicine

## 2014-02-19 ENCOUNTER — Encounter: Payer: Self-pay | Admitting: Internal Medicine

## 2014-02-19 VITALS — BP 122/68 | HR 64 | Ht 65.5 in | Wt 110.4 lb

## 2014-02-19 DIAGNOSIS — J309 Allergic rhinitis, unspecified: Secondary | ICD-10-CM

## 2014-02-19 MED ORDER — MONTELUKAST SODIUM 10 MG PO TABS: 10.0000 mg | ORAL_TABLET | Freq: Every day | ORAL | Status: DC

## 2014-02-19 NOTE — Progress Notes (Signed)
Patient ID: [name] [name], female    DOB: 1951-07-24, 63 y.o.   MRN: [id number]  HPI 62 yoF followed for allergic rhinitis. Last here February 11, 2010 and reports no major health changes. Being outdoors more recently she has noted some nasal congestion. Bothered more by watery drainage. We discussed available decongestants and antihistamines. singulair has worked better for her than antihistamines and we explained the difference again.  02/09/12- 75 yoF never smoker followed for allergic rhinitis. She had a prolonged bronchitis episode this winter but is better now. Evaluated for palpitation with echocardiogram and diagnosed aortic insufficiency, treated with diltiazem. Dr Percival Spanish. Control is comfortable with no problems this spring. Singulair helps and is sufficient without an antihistamine.  02/13/13- 52 yoF never smoker followed for allergic rhinitis. FOLLOWS FOR: Allergies are doing well. Might have had a mold exposure. Singulair is helping to manage her symptoms. She got into mold exposure while cleaning a house last summer. Made her feel congested and tired. Doing well now.  02/19/14- 62 yoF never smoker followed for allergic rhinitis. 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 And he has done well with a few colds over the last winter. Uses nasal saline, Singulair and Flonase appropriately when needed.  ROS-see HPI Constitutional:   No-   weight loss, night sweats, fevers, chills, fatigue, lassitude. HEENT:   No-  headaches, difficulty swallowing, tooth/dental problems, sore throat,       No-  sneezing, itching, ear ache, nasal congestion, post nasal drip,  CV:  No-   chest pain, orthopnea, PND, swelling in lower extremities, anasarca, dizziness, palpitations Resp: No-   shortness of breath with exertion or at rest.              No-   productive cough,  No non-productive cough,  No- coughing up of blood.      No-   change in color of mucus.  No- wheezing.   Skin: No-   rash or lesions. GI:  No-   heartburn, indigestion, abdominal pain, nausea, vomiting,  GU:  MS:  No-   joint pain or swelling.   Neuro-     nothing unusual Psych:  No- change in mood or affect. No depression or anxiety.  No memory loss.   OBJ- Physical Exam General- Alert, Oriented, Affect-appropriate, Distress- none acute .  Trim. Skin- rash-none, lesions- none, excoriation- none Lymphadenopathy- none Head- atraumatic            Eyes- Gross vision intact, PERRLA, conjunctivae and secretions clear            Ears- Hearing, canals-normal            Nose- Clear, pale, no-Septal dev, mucus, polyps, erosion, perforation             Throat- Mallampati II , mucosa clear , drainage- none, tonsils- atrophic Neck- flexible , trachea midline, no stridor , thyroid nl, carotid no bruit Chest - symmetrical excursion , unlabored           Heart/CV- RRR , trace diastolic murmur L sternum , no gallop  , no rub, nl s1 s2                           - JVD- none , edema- none, stasis changes- none, varices- none           Lung-  clear to P&A, wheeze- none, cough- none , dullness-none, rub- none           Chest wall-  Abd- Br/ Gen/ Rectal- Not done, not indicated Extrem- cyanosis- none, clubbing, none, atrophy- none, strength- nl Neuro- grossly intact to observation

## 2014-02-19 NOTE — Patient Instructions (Addendum)
Refill script for Singulair sent to [facility]  You can choose to try off Singulair to see if you still need it.  You might like to try an otc nasal saline gel in your nose for dryness if needed, especially with dry winter heat.  Ok to use [employer] or Nasacort as otc nasal allergy sprays if needed seasonally

## 2014-03-20 NOTE — Assessment & Plan Note (Signed)
Seasonal and perennial rhinitis, controlled

## 2014-04-13 ENCOUNTER — Telehealth: Payer: Self-pay | Admitting: Internal Medicine

## 2014-04-13 ENCOUNTER — Encounter (HOSPITAL_COMMUNITY): Payer: Self-pay | Admitting: Emergency Medicine

## 2014-04-13 ENCOUNTER — Emergency Department (HOSPITAL_COMMUNITY)
Admission: EM | Admit: 2014-04-13 | Discharge: 2014-04-13 | Disposition: A | Discharge status: Home / Self Care | Payer: BC Managed Care – PPO | Attending: Emergency Medicine | Admitting: Emergency Medicine

## 2014-04-13 ENCOUNTER — Telehealth: Payer: Self-pay | Admitting: Gastroenterology

## 2014-04-13 DIAGNOSIS — J309 Allergic rhinitis, unspecified: Secondary | ICD-10-CM | POA: Insufficient documentation

## 2014-04-13 DIAGNOSIS — M899 Disorder of bone, unspecified: Secondary | ICD-10-CM | POA: Insufficient documentation

## 2014-04-13 DIAGNOSIS — Z7982 Long term (current) use of aspirin: Secondary | ICD-10-CM | POA: Insufficient documentation

## 2014-04-13 DIAGNOSIS — K5730 Diverticulosis of large intestine without perforation or abscess without bleeding: Secondary | ICD-10-CM | POA: Insufficient documentation

## 2014-04-13 DIAGNOSIS — K922 Gastrointestinal hemorrhage, unspecified: Secondary | ICD-10-CM

## 2014-04-13 DIAGNOSIS — Z888 Allergy status to other drugs, medicaments and biological substances status: Secondary | ICD-10-CM | POA: Insufficient documentation

## 2014-04-13 DIAGNOSIS — K921 Melena: Secondary | ICD-10-CM | POA: Insufficient documentation

## 2014-04-13 DIAGNOSIS — Z79899 Other long term (current) drug therapy: Secondary | ICD-10-CM | POA: Insufficient documentation

## 2014-04-13 DIAGNOSIS — M949 Disorder of cartilage, unspecified: Secondary | ICD-10-CM

## 2014-04-13 LAB — TYPE AND SCREEN
ABO/RH(D): A POS
ANTIBODY SCREEN: NEGATIVE

## 2014-04-13 LAB — CBC
HEMATOCRIT: 39.4 % (ref 36.0–46.0)
HEMATOCRIT: 36.4 % (ref 36.0–46.0)
HEMOGLOBIN: 11.9 g/dL — AB (ref 12.0–15.0)
Hemoglobin: 12.6 g/dL (ref 12.0–15.0)
MCH: 31.5 pg (ref 26.0–34.0)
MCH: 32 pg (ref 26.0–34.0)
MCHC: 32 g/dL (ref 30.0–36.0)
MCHC: 32.7 g/dL (ref 30.0–36.0)
MCV: 98.5 fL (ref 78.0–100.0)
MCV: 97.8 fL (ref 78.0–100.0)
PLATELETS: 248 10*3/uL (ref 150–400)
Platelets: 209 10*3/uL (ref 150–400)
RBC: 4 MIL/uL (ref 3.87–5.11)
RBC: 3.72 MIL/uL — AB (ref 3.87–5.11)
RDW: 12.9 % (ref 11.5–15.5)
RDW: 12.7 % (ref 11.5–15.5)
WBC: 6.3 10*3/uL (ref 4.0–10.5)
WBC: 7.2 10*3/uL (ref 4.0–10.5)

## 2014-04-13 LAB — ABO/RH: ABO/RH(D): A POS

## 2014-04-13 LAB — COMPREHENSIVE METABOLIC PANEL
ALT: 12 U/L (ref 0–35)
AST: 22 U/L (ref 0–37)
Albumin: 3.8 g/dL (ref 3.5–5.2)
Alkaline Phosphatase: 43 U/L (ref 39–117)
BILIRUBIN TOTAL: 0.4 mg/dL (ref 0.3–1.2)
BUN: 20 mg/dL (ref 6–23)
CHLORIDE: 105 meq/L (ref 96–112)
CO2: 23 meq/L (ref 19–32)
CREATININE: 0.66 mg/dL (ref 0.50–1.10)
Calcium: 9.4 mg/dL (ref 8.4–10.5)
GFR calc Af Amer: >90 mL/min (ref >90)
Glucose, Bld: 105 mg/dL — ABNORMAL HIGH (ref 70–99)
Potassium: 3.6 mEq/L — ABNORMAL LOW (ref 3.7–5.3)
Sodium: 143 mEq/L (ref 137–147)
Total Protein: 7.1 g/dL (ref 6.0–8.3)

## 2014-04-13 LAB — POC OCCULT BLOOD, ED: Fecal Occult Bld: POSITIVE — AB

## 2014-04-13 NOTE — ED Notes (Signed)
C/o bright red rectal bleeding since 3am.  Denies pain.

## 2014-04-13 NOTE — Discharge Instructions (Signed)
Your hemoglobin is stable in the ER. Please call the GI doctor immediately and set up an appointment for this week. Return to the Er if the bleeding gets worse, you start getting dizzy, feel like you might faint.   Bloody Stools Bloody stools often mean that there is a problem in the digestive tract. Your caregiver may use the term "melena" to describe black, tarry, and bad smelling stools or "hematochezia" to describe red or maroon-colored stools. Blood seen in the stool can be caused by bleeding anywhere along the intestinal tract.  A black stool usually means that blood is coming from the upper part of the gastrointestinal tract (esophagus, stomach, or small bowel). Passing maroon-colored stools or bright red blood usually means that blood is coming from lower down in the large bowel or the rectum. However, sometimes massive bleeding in the stomach or small intestine can cause bright red bloody stools.  Consuming black licorice, lead, iron pills, medicines containing bismuth subsalicylate, or blueberries can also cause black stools. Your caregiver can test black stools to see if blood is present. It is important that the cause of the bleeding be found. Treatment can then be started, and the problem can be corrected. Rectal bleeding may not be serious, but you should not assume everything is okay until you know the cause.It is very important to follow up with your caregiver or a specialist in gastrointestinal problems. CAUSES  Blood in the stools can come from various underlying causes.Often, the cause is not found during your first visit. Testing is often needed to discover the cause of bleeding in the gastrointestinal tract. Causes range from simple to serious or even life-threatening.Possible causes include:  Hemorrhoids.These are veins that are full of blood (engorged) in the rectum. They cause pain, inflammation, and may bleed.  Anal fissures.These are areas of painful tearing which may  bleed. They are often caused by passing hard stool.  Diverticulosis.These are pouches that form on the colon over time, with age, and may bleed significantly.  Diverticulitis.This is inflammation in areas with diverticulosis. It can cause pain, fever, and bloody stools, although bleeding is rare.  Proctitis and colitis. These are inflamed areas of the rectum or colon. They may cause pain, fever, and bloody stools.  Polyps and cancer. Colon cancer is a leading cause of preventable cancer death.It often starts out as precancerous polyps that can be removed during a colonoscopy, preventing progression into cancer. Sometimes, polyps and cancer may cause rectal bleeding.  Gastritis and ulcers.Bleeding from the upper gastrointestinal tract (near the stomach) may travel through the intestines and produce black, sometimes tarry, often bad smelling stools. In certain cases, if the bleeding is fast enough, the stools may not be black, but red and the condition may be life-threatening. SYMPTOMS  You may have stools that are bright red and bloody, that are normal color with blood on them, or that are dark black and tarry. In some cases, you may only have blood in the toilet bowl. Any of these cases need medical care. You may also have:  Pain at the anus or anywhere in the rectum.  Lightheadedness or feeling faint.  Extreme weakness.  Nausea or vomiting.  Fever. DIAGNOSIS Your caregiver may use the following methods to find the cause of your bleeding:  Taking a medical history. Age is important. Older people tend to develop polyps and cancer more often. If there is anal pain and a hard, large stool associated with bleeding, a tear of the anus may  be the cause. If blood drips into the toilet after a bowel movement, bleeding hemorrhoids may be the problem. The color and frequency of the bleeding are additional considerations. In most cases, the medical history provides clues, but seldom the final  answer.  A visual and finger (digital) exam. Your caregiver will inspect the anal area, looking for tears and hemorrhoids. A finger exam can provide information when there is tenderness or a growth inside. In men, the prostate is also examined.  Endoscopy. Several types of small, long scopes (endoscopes) are used to view the colon.  In the office, your caregiver may use a rigid, or more commonly, a flexible viewing sigmoidoscope. This exam is called flexible sigmoidoscopy. It is performed in 5 to 10 minutes.  A more thorough exam is accomplished with a colonoscope. It allows your caregiver to view the entire 5 to 6 foot long colon. Medicine to help you relax (sedative) is usually given for this exam. Frequently, a bleeding lesion may be present beyond the reach of the sigmoidoscope. So, a colonoscopy may be the best exam to start with. Both exams are usually done on an outpatient basis. This means the patient does not stay overnight in the hospital or surgery center.  An upper endoscopy may be needed to examine your stomach. Sedation is used and a flexible endoscope is put in your mouth, down to your stomach.  A barium enema X-ray. This is an X-ray exam. It uses liquid barium inserted by enema into the rectum. This test alone may not identify an actual bleeding point. X-rays highlight abnormal shadows, such as those made by lumps (tumors), diverticuli, or colitis. TREATMENT  Treatment depends on the cause of your bleeding.   For bleeding from the stomach or colon, the caregiver doing your endoscopy or colonoscopy may be able to stop the bleeding as part of the procedure.  Inflammation or infection of the colon can be treated with medicines.  Many rectal problems can be treated with creams, suppositories, or warm baths.  Surgery is sometimes needed.  Blood transfusions are sometimes needed if you have lost a lot of blood.  For any bleeding problem, let your caregiver know if you take aspirin  or other blood thinners regularly. HOME CARE INSTRUCTIONS   Take any medicines exactly as prescribed.  Keep your stools soft by eating a diet high in fiber. Prunes (1 to 3 a day) work well for many people.  Drink enough water and fluids to keep your urine clear or pale yellow.  Take sitz baths if advised. A sitz bath is when you sit in a bathtub with warm water for 10 to 15 minutes to soak, soothe, and cleanse the rectal area.  If enemas or suppositories are advised, be sure you know how to use them. Tell your caregiver if you have problems with this.  Monitor your bowel movements to look for signs of improvement or worsening. SEEK MEDICAL CARE IF:   You do not improve in the time expected.  Your condition worsens after initial improvement.  You develop any new symptoms. SEEK IMMEDIATE MEDICAL CARE IF:   You develop severe or prolonged rectal bleeding.  You vomit blood.  You feel weak or faint.  You have a fever. MAKE SURE YOU:  Understand these instructions.  Will watch your condition.  Will get help right away if you are not doing well or get worse. Document Released: 10/06/2002 Document Revised: 01/08/2012 Document Reviewed: 03/03/2011 [facility] Patient Information 2014 [place], [place].  Gastrointestinal Bleeding Gastrointestinal bleeding is bleeding somewhere along the path that food travels through the body (digestive tract). This path is anywhere between the mouth and the opening of the butt (anus). You may have blood in your throw up (vomit) or in your poop (stools). If there is a lot of bleeding, you may need to stay in the hospital. [place]  Only take medicine as told by your doctor.  Eat foods with fiber such as whole grains, fruits, and vegetables. You can also try eating 1 to 3 prunes a day.  Drink enough fluids to keep your pee (urine) clear or pale yellow. GET HELP RIGHT AWAY IF:   Your bleeding gets worse.  You feel dizzy, weak, or you pass out  (faint).  You have bad cramps in your back or belly (abdomen).  You have large blood clumps (clots) in your poop.  Your problems are getting worse. MAKE SURE YOU:   Understand these instructions.  Will watch your condition.  Will get help right away if you are not doing well or get worse. Document Released: 07/25/2008 Document Revised: 10/02/2012 Document Reviewed: 09/25/2011 [facility] Patient Information 2014 [place], [place].

## 2014-04-13 NOTE — Telephone Encounter (Signed)
Pt was seen in the ER ?lower gi bleed/rectal bleeding. Pt states she was told to follow-up with her GI doc in 1-3 days. Pt scheduled to see Tye Savoy NP 04/15/14@3pm . Pt aware of appt.

## 2014-04-13 NOTE — Telephone Encounter (Signed)
Patient has appt on 04/15/14

## 2014-04-13 NOTE — Telephone Encounter (Signed)
Pt was seen in [place] for rectal bleeding. Pt will make own appt to see dr Collene Mares

## 2014-04-13 NOTE — ED Notes (Signed)
ED Doctor at bedside.  

## 2014-04-14 ENCOUNTER — Encounter (HOSPITAL_COMMUNITY): Payer: Self-pay | Admitting: Emergency Medicine

## 2014-04-14 ENCOUNTER — Telehealth: Payer: Self-pay | Admitting: Gastroenterology

## 2014-04-14 ENCOUNTER — Emergency Department (HOSPITAL_COMMUNITY)
Admission: EM | Admit: 2014-04-14 | Discharge: 2014-04-14 | Disposition: A | Discharge status: Home / Self Care | Payer: BC Managed Care – PPO | Attending: Emergency Medicine | Admitting: Emergency Medicine

## 2014-04-14 DIAGNOSIS — Z8739 Personal history of other diseases of the musculoskeletal system and connective tissue: Secondary | ICD-10-CM | POA: Insufficient documentation

## 2014-04-14 DIAGNOSIS — Z8709 Personal history of other diseases of the respiratory system: Secondary | ICD-10-CM | POA: Insufficient documentation

## 2014-04-14 DIAGNOSIS — Z79899 Other long term (current) drug therapy: Secondary | ICD-10-CM | POA: Insufficient documentation

## 2014-04-14 DIAGNOSIS — Z8679 Personal history of other diseases of the circulatory system: Secondary | ICD-10-CM | POA: Insufficient documentation

## 2014-04-14 DIAGNOSIS — IMO0002 Reserved for concepts with insufficient information to code with codable children: Secondary | ICD-10-CM | POA: Insufficient documentation

## 2014-04-14 DIAGNOSIS — Z7982 Long term (current) use of aspirin: Secondary | ICD-10-CM | POA: Insufficient documentation

## 2014-04-14 DIAGNOSIS — K922 Gastrointestinal hemorrhage, unspecified: Secondary | ICD-10-CM | POA: Insufficient documentation

## 2014-04-14 DIAGNOSIS — Z8669 Personal history of other diseases of the nervous system and sense organs: Secondary | ICD-10-CM | POA: Insufficient documentation

## 2014-04-14 DIAGNOSIS — Z8781 Personal history of (healed) traumatic fracture: Secondary | ICD-10-CM | POA: Insufficient documentation

## 2014-04-14 LAB — COMPREHENSIVE METABOLIC PANEL
ALT: 11 U/L (ref 0–35)
AST: 21 U/L (ref 0–37)
Albumin: 4.2 g/dL (ref 3.5–5.2)
Alkaline Phosphatase: 42 U/L (ref 39–117)
BUN: 26 mg/dL — AB (ref 6–23)
CALCIUM: 9.5 mg/dL (ref 8.4–10.5)
CO2: 25 mEq/L (ref 19–32)
Chloride: 102 mEq/L (ref 96–112)
Creatinine, Ser: 0.7 mg/dL (ref 0.50–1.10)
GLUCOSE: 96 mg/dL (ref 70–99)
Potassium: 3.7 mEq/L (ref 3.7–5.3)
Sodium: 140 mEq/L (ref 137–147)
TOTAL PROTEIN: 7.3 g/dL (ref 6.0–8.3)
Total Bilirubin: 0.5 mg/dL (ref 0.3–1.2)

## 2014-04-14 LAB — CBC
HEMATOCRIT: 33.4 % — AB (ref 36.0–46.0)
HEMOGLOBIN: 11.2 g/dL — AB (ref 12.0–15.0)
MCH: 32.4 pg (ref 26.0–34.0)
MCHC: 33.5 g/dL (ref 30.0–36.0)
MCV: 96.5 fL (ref 78.0–100.0)
Platelets: 264 10*3/uL (ref 150–400)
RBC: 3.46 MIL/uL — ABNORMAL LOW (ref 3.87–5.11)
RDW: 12.9 % (ref 11.5–15.5)
WBC: 9.3 10*3/uL (ref 4.0–10.5)

## 2014-04-14 LAB — TYPE AND SCREEN
ABO/RH(D): A POS
Antibody Screen: NEGATIVE

## 2014-04-14 LAB — ABO/RH: ABO/RH(D): A POS

## 2014-04-14 NOTE — Telephone Encounter (Signed)
Pt states she has continued to have rectal bleeding, states that she has passed some clots. Pt also reports that she is feeling weaker. Pt instructed to go to the ER to be evaluated. Pt verbalized understanding.

## 2014-04-14 NOTE — ED Provider Notes (Addendum)
CSN: [id number]     Arrival date & time 04/14/14  1103 History   First MD Initiated Contact with Patient 04/14/14 1136     Chief Complaint  Patient presents with  . Rectal Bleeding     (Consider location/radiation/quality/duration/timing/severity/associated sxs/prior Treatment) Patient is a 63 y.o. female presenting with hematochezia. The history is provided by the patient.  Rectal Bleeding Quality: dark clots and some bright red on the toilet paper. Amount:  Moderate Duration:  2 days Timing:  Constant Progression:  Unchanged Chronicity:  New Context: defecation and spontaneously   Context: not constipation and not rectal pain   Similar prior episodes: no   Relieved by:  None tried Worsened by:  Defecation Ineffective treatments:  None tried Associated symptoms: light-headedness   Associated symptoms: no abdominal pain, no dizziness and no loss of consciousness   Associated symptoms comment:  Today started to feel generally weak and fatigued.  No CP or SOB Risk factors: no anticoagulant use, no hx of colorectal cancer and no NSAID use   Risk factors comment:  ASA 81mg  only   Past Medical History  Diagnosis Date  . Osteopenia   . Allergic rhinitis   . Clavicle fracture 3/08  . CHEST WALL PAIN, ACUTE 07/21/2010    Qualifier: Diagnosis of  By: Sarajane Jews MD, Ishmael Holter   . Aortic insufficiency   . CAT SCRATCH 09/10/2009    Qualifier: Diagnosis of  By: Regis Bill MD, [name]   . Vision disturbance 03/13/2013    Negative neuro MRA MRI   Past Surgical History  Procedure Laterality Date  . Tonsillectomy and adenoidectomy    . Laparoscopic salpingo oopherectomy    . Appendectomy    . Abdominal hysterectomy     Family History  Problem Relation Age of Onset  . Scleroderma Mother   . Hyperlipidemia Mother   . Coronary artery disease Father 42   History  Substance Use Topics  . Smoking status: Never Smoker   . Smokeless tobacco: Never Used  . Alcohol Use: No     Comment: never    OB History   Grav Para Term Preterm Abortions TAB SAB Ect Mult Living                 Review of Systems  Gastrointestinal: Positive for hematochezia. Negative for abdominal pain.  Neurological: Positive for light-headedness. Negative for dizziness and loss of consciousness.  All other systems reviewed and are negative.     Allergies  Codeine phosphate and Meperidine hcl  Home Medications   Prior to Admission medications   Medication Sig Start Date End Date Taking? Authorizing Provider  aspirin EC 81 MG tablet Take 81 mg by mouth daily.   Yes Historical Provider, MD  Calcium Carbonate-Vitamin D (CALCIUM PLUS VITAMIN D PO) Take 2 tablets by mouth daily.    Yes Historical Provider, MD  diltiazem (CARDIZEM CD) 120 MG 24 hr capsule Take 1 capsule (120 mg total) by mouth daily. 07/31/13 07/31/14 Yes Minus Breeding, MD  fluticasone (FLONASE) 50 MCG/ACT nasal spray Place 2 sprays into both nostrils daily as needed for allergies.  05/14/13  Yes Burnis Medin, MD  ibuprofen (ADVIL,MOTRIN) 800 MG tablet Take 800 mg by mouth 2 (two) times daily as needed for moderate pain.    Yes Historical Provider, MD  LORazepam (ATIVAN) 0.5 MG tablet Take 0.5 mg by mouth 3 (three) times daily as needed for anxiety.    Yes Historical Provider, MD  montelukast (SINGULAIR) 10 MG tablet  Take 1 tablet (10 mg total) by mouth daily. 02/19/14 02/24/16 Yes Clinton D Young, MD  Multiple Vitamins-Minerals (PRESERVISION AREDS PO) Take 1 tablet by mouth 2 (two) times daily.    Yes Historical Provider, MD   BP 113/77  Pulse 96  Temp(Src) 98.2 F (36.8 C) (Oral)  Resp 17  SpO2 98% Physical Exam  Nursing note and vitals reviewed. Constitutional: She is oriented to person, place, and time. She appears well-developed and well-nourished. No distress.  HENT:  Head: Normocephalic and atraumatic.  Mouth/Throat: Oropharynx is clear and moist.  Eyes: Conjunctivae and EOM are normal. Pupils are equal, round, and reactive to  light.  Neck: Normal range of motion. Neck supple. No spinous process tenderness and no muscular tenderness present. No rigidity. Normal range of motion present.  Cardiovascular: Normal rate, regular rhythm, normal heart sounds and intact distal pulses.   No murmur heard. Pulmonary/Chest: Effort normal and breath sounds normal. No respiratory distress. She has no wheezes. She has no rales.  Abdominal: Soft. She exhibits no distension. There is no tenderness. There is no rebound, no guarding and no CVA tenderness.  Musculoskeletal: She exhibits no edema and no tenderness.       Lumbar back: She exhibits decreased range of motion and pain. She exhibits no swelling, no deformity and normal pulse.  Neurological: She is alert and oriented to person, place, and time. Coordination normal.  Reflex Scores:      Patellar reflexes are 1+ on the right side and 1+ on the left side. Skin: Skin is warm and dry. No rash noted. No erythema.  Psychiatric: She has a normal mood and affect. Her behavior is normal.    ED Course  Procedures (including critical care time) Labs Review Labs Reviewed  CBC - Abnormal; Notable for the following:    RBC 3.46 (*)    Hemoglobin 11.2 (*)    HCT 33.4 (*)    All other components within normal limits  COMPREHENSIVE METABOLIC PANEL - Abnormal; Notable for the following:    BUN 26 (*)    All other components within normal limits  POC OCCULT BLOOD, ED  TYPE AND SCREEN  ABO/RH    Imaging Review No results found.   EKG Interpretation None      MDM   Final diagnoses:  Lower GI bleeding    Patient seen yesterday for rectal bleeding that has persisted has an appointment to see gastroenterology tomorrow but started to feel generalized weakness and fatigue and she called the office they instructed her to come here for evaluation. She did not take her aspirin yesterday and takes no other anticoagulation.  She denies any abdominal pain states that they're blood clots  and dark blood per rectum when she has a bowel movement.  Abdomen is soft and vital signs are within normal limits. She did take her blood pressure medication this morning but states she has not eaten because she was unsure if that would be a good idea.  Hemoglobin left yesterday when she left was 11.9. Today is 11.2. She is nontoxic on exam.  1:47 PM Rest of labs normal.  Pt will f/u with [facility] tomorrow as planned.  Blanchie Dessert, MD 04/14/14 [place], MD 04/14/14 1349

## 2014-04-14 NOTE — Discharge Instructions (Signed)
Bloody Stools Bloody stools means there is blood in your poop (stool). It is a sign that there is a problem somewhere in the digestive system. It is important for your doctor to find the cause of your bleeding, so the problem can be treated.  HOME CARE  Only take medicine as told by your doctor.  Eat foods with fiber (prunes, bran cereals).  Drink enough fluids to keep your pee (urine) clear or pale yellow.  Sit in warm water (sitz bath) for 10 to 15 minutes as told by your doctor.  Know how to take your medicines (enemas, suppositories) if advised by your doctor.  Watch for signs that you are getting better or getting worse. GET HELP RIGHT AWAY IF:   You are not getting better.  You start to get better but then get worse again.  You have new problems.  You have severe bleeding from the place where poop comes out (rectum) that does not stop.  You throw up (vomit) blood.  You feel weak or pass out (faint).  You have a fever. MAKE SURE YOU:   Understand these instructions.  Will watch your condition.  Will get help right away if you are not doing well or get worse. Document Released: 10/04/2009 Document Revised: 01/08/2012 Document Reviewed: 03/03/2011 [facility] Patient Information 2014 [place].

## 2014-04-14 NOTE — ED Provider Notes (Signed)
CSN: [id number]     Arrival date & time 04/13/14  0430 History   First MD Initiated Contact with Patient 04/13/14 [phone]     Chief Complaint  Patient presents with  . Rectal Bleeding     (Consider location/radiation/quality/duration/timing/severity/associated sxs/prior Treatment) Patient is a 63 y.o. female presenting with hematochezia. The history is provided by the patient.  Rectal Bleeding Quality:  Bright red Amount:  Moderate Duration:  4 hours Timing:  Intermittent Chronicity:  New Context: not anal fissures and not hemorrhoids   Similar prior episodes: no   Associated symptoms: no abdominal pain, no dizziness, no light-headedness and no vomiting   Risk factors: no anticoagulant use, no hx of colorectal cancer, no liver disease and no NSAID use     Past Medical History  Diagnosis Date  . Osteopenia   . Allergic rhinitis   . Clavicle fracture 3/08  . CHEST WALL PAIN, ACUTE 07/21/2010    Qualifier: Diagnosis of  By: Sarajane Jews MD, Ishmael Holter   . Aortic insufficiency   . CAT SCRATCH 09/10/2009    Qualifier: Diagnosis of  By: Regis Bill MD, [name]   . Vision disturbance 03/13/2013    Negative neuro MRA MRI   Past Surgical History  Procedure Laterality Date  . Tonsillectomy and adenoidectomy    . Laparoscopic salpingo oopherectomy    . Appendectomy    . Abdominal hysterectomy     Family History  Problem Relation Age of Onset  . Scleroderma Mother   . Hyperlipidemia Mother   . Coronary artery disease Father 60   History  Substance Use Topics  . Smoking status: Never Smoker   . Smokeless tobacco: Never Used  . Alcohol Use: No     Comment: never   OB History   Grav Para Term Preterm Abortions TAB SAB Ect Mult Living                 Review of Systems  Constitutional: Negative for activity change and fatigue.  HENT: Negative for facial swelling.   Respiratory: Negative for cough, shortness of breath and wheezing.   Cardiovascular: Negative for chest pain.   Gastrointestinal: Positive for blood in stool and hematochezia. Negative for nausea, vomiting, abdominal pain, diarrhea, constipation and abdominal distention.  Genitourinary: Negative for hematuria and difficulty urinating.  Musculoskeletal: Negative for neck pain.  Skin: Negative for color change.  Neurological: Negative for dizziness, speech difficulty and light-headedness.  Hematological: Does not bruise/bleed easily.  Psychiatric/Behavioral: Negative for confusion.      Allergies  Codeine phosphate and Meperidine hcl  Home Medications   Prior to Admission medications   Medication Sig Start Date End Date Taking? Authorizing Provider  aspirin EC 81 MG tablet Take 81 mg by mouth daily.   Yes Historical Provider, MD  Calcium Carbonate-Vitamin D (CALCIUM PLUS VITAMIN D PO) Take 2 tablets by mouth daily.    Yes Historical Provider, MD  diltiazem (CARDIZEM CD) 120 MG 24 hr capsule Take 1 capsule (120 mg total) by mouth daily. 07/31/13 07/31/14 Yes Minus Breeding, MD  fluticasone (FLONASE) 50 MCG/ACT nasal spray Place 2 sprays into both nostrils daily as needed for allergies.  05/14/13  Yes Burnis Medin, MD  ibuprofen (ADVIL,MOTRIN) 800 MG tablet Take 800 mg by mouth 2 (two) times daily as needed for moderate pain.    Yes Historical Provider, MD  LORazepam (ATIVAN) 0.5 MG tablet Take 0.5 mg by mouth 3 (three) times daily as needed for anxiety.    Yes Historical  Provider, MD  montelukast (SINGULAIR) 10 MG tablet Take 1 tablet (10 mg total) by mouth daily. 02/19/14 02/24/16 Yes Clinton D Young, MD  Multiple Vitamins-Minerals (PRESERVISION AREDS PO) Take 1 tablet by mouth 2 (two) times daily.    Yes Historical Provider, MD   BP 127/63  Pulse 75  Temp(Src) 98.3 F (36.8 C) (Oral)  Resp 16  Ht 5\' 5"  (1.651 m)  Wt 110 lb 1 oz (49.924 kg)  BMI 18.32 kg/m2  SpO2 100% Physical Exam  Nursing note and vitals reviewed. Constitutional: She is oriented to person, place, and time. She appears  well-developed and well-nourished.  HENT:  Head: Normocephalic and atraumatic.  Eyes: EOM are normal. Pupils are equal, round, and reactive to light.  Neck: Neck supple.  Cardiovascular: Normal rate, regular rhythm and normal heart sounds.   No murmur heard. Pulmonary/Chest: Effort normal. No respiratory distress.  Abdominal: Soft. She exhibits no distension. There is no tenderness. There is no rebound and no guarding.  Neurological: She is alert and oriented to person, place, and time.  Skin: Skin is warm and dry.    ED Course  Procedures (including critical care time) Labs Review Labs Reviewed  COMPREHENSIVE METABOLIC PANEL - Abnormal; Notable for the following:    Potassium 3.6 (*)    Glucose, Bld 105 (*)    All other components within normal limits  CBC - Abnormal; Notable for the following:    RBC 3.72 (*)    Hemoglobin 11.9 (*)    All other components within normal limits  POC OCCULT BLOOD, ED - Abnormal; Notable for the following:    Fecal Occult Bld POSITIVE (*)    All other components within normal limits  CBC  TYPE AND SCREEN  ABO/RH    Imaging Review No results found.   EKG Interpretation None      MDM   Final diagnoses:  Lower GI bleed  Hematochezia    DDx includes: Diverticular bleed Colon cancer Rectal bleed Internal hemorrhoids External hemorrhoids  Pt with hematochezia. Likely diverticular bleed vs. Internal hemorrhoids given painless. No hard risk factors for massive bleeds or severe comorbidities - and thus we got serial CBC which shows stable Hb.  GI f/u provided. Return precautions discussed.   Varney Biles, MD 04/14/14 [phone]

## 2014-04-14 NOTE — ED Notes (Signed)
Pt c/o rectal bleeding since yesterday morning.  Pt states that she has small amount of BM and blood in her stools that very from dark to red blood.  Pt went to [facility] ED and was seen yesterday.  Pt came in today due to the weakness and continued bleeding.

## 2014-04-15 ENCOUNTER — Encounter: Payer: Self-pay | Admitting: Nurse Practitioner

## 2014-04-15 ENCOUNTER — Other Ambulatory Visit (INDEPENDENT_AMBULATORY_CARE_PROVIDER_SITE_OTHER): Payer: BC Managed Care – PPO

## 2014-04-15 ENCOUNTER — Ambulatory Visit (INDEPENDENT_AMBULATORY_CARE_PROVIDER_SITE_OTHER): Payer: BC Managed Care – PPO | Admitting: Nurse Practitioner

## 2014-04-15 VITALS — BP 100/56 | HR 96 | Ht 64.75 in | Wt 109.4 lb

## 2014-04-15 DIAGNOSIS — Z1211 Encounter for screening for malignant neoplasm of colon: Secondary | ICD-10-CM

## 2014-04-15 DIAGNOSIS — K625 Hemorrhage of anus and rectum: Secondary | ICD-10-CM

## 2014-04-15 DIAGNOSIS — K649 Unspecified hemorrhoids: Secondary | ICD-10-CM

## 2014-04-15 LAB — CBC WITH DIFFERENTIAL/PLATELET
BASOS ABS: 0 10*3/uL (ref 0.0–0.1)
Basophils Relative: 0.4 % (ref 0.0–3.0)
EOS ABS: 0.1 10*3/uL (ref 0.0–0.7)
Eosinophils Relative: 1.2 % (ref 0.0–5.0)
HEMATOCRIT: 29.9 % — AB (ref 36.0–46.0)
Hemoglobin: 10 g/dL — ABNORMAL LOW (ref 12.0–15.0)
LYMPHS ABS: 2.8 10*3/uL (ref 0.7–4.0)
Lymphocytes Relative: 37 % (ref 12.0–46.0)
MCHC: 33.5 g/dL (ref 30.0–36.0)
MCV: 96.9 fl (ref 78.0–100.0)
Monocytes Absolute: 0.5 10*3/uL (ref 0.1–1.0)
Monocytes Relative: 6.5 % (ref 3.0–12.0)
NEUTROS ABS: 4.1 10*3/uL (ref 1.4–7.7)
Neutrophils Relative %: 54.9 % (ref 43.0–77.0)
Platelets: 221 10*3/uL (ref 150.0–400.0)
RBC: 3.08 Mil/uL — ABNORMAL LOW (ref 3.87–5.11)
RDW: 13.3 % (ref 11.5–15.5)
WBC: 7.5 10*3/uL (ref 4.0–10.5)

## 2014-04-15 MED ORDER — NA SULFATE-K SULFATE-MG SULF 17.5-3.13-1.6 GM/177ML PO SOLN: 1.0000 | Freq: Once | ORAL | Status: DC

## 2014-04-15 MED ORDER — HYDROCORTISONE ACETATE 25 MG RE SUPP: RECTAL | Status: DC

## 2014-04-15 NOTE — Patient Instructions (Addendum)
You have been scheduled for a colonoscopy with propofol. Please follow written instructions given to you at your visit today.   We have given you a sample of the Suprep bowel purge. If you use inhalers (even only as needed), please bring them with you on the day of your procedure. Your physician has requested that you go to www.startemmi.com and enter the access code given to you at your visit today. This web site gives a general overview about your procedure. However, you should still follow specific instructions given to you by our office regarding your preparation for the procedure. . We sent a prescription for Anusol [facility] suppositories to [facility] [address]. Take daily fiber Citrucel. You can get this at the pharmacy. Use Miralax , 17 grams in 8 oz of water or juice for a few weeks.

## 2014-04-16 ENCOUNTER — Telehealth: Payer: Self-pay | Admitting: Nurse Practitioner

## 2014-04-16 ENCOUNTER — Encounter: Payer: Self-pay | Admitting: Nurse Practitioner

## 2014-04-16 DIAGNOSIS — Z1211 Encounter for screening for malignant neoplasm of colon: Secondary | ICD-10-CM | POA: Insufficient documentation

## 2014-04-16 DIAGNOSIS — K649 Unspecified hemorrhoids: Secondary | ICD-10-CM | POA: Insufficient documentation

## 2014-04-16 DIAGNOSIS — K625 Hemorrhage of anus and rectum: Secondary | ICD-10-CM | POA: Insufficient documentation

## 2014-04-16 NOTE — Progress Notes (Signed)
HPI :   Patient is a 63 year old female known remotely to Dr. Deatra Ina from previous colonoscopy. She had a colonoscopy October 2005 for evaluation of hematochezia. Findings included diverticulosis and internal hemorrhoids.    Patient was seen in the emergency department 2 days ago for painless hematochezia. Patient had several episodes of painless bleeding on Monday . She presented to the emergency department where hemoglobin was 11.9, down from her baseline of 14.  Bleeding continued and patient went back to the emergency department yesterday. Hemoglobin at that time was 11.2. No significant bleeding today. No other GI complaints   Past Medical History  Diagnosis Date  . Osteopenia   . Allergic rhinitis   . Clavicle fracture 3/08    left  . CHEST WALL PAIN, ACUTE 07/21/2010    Qualifier: Diagnosis of  By: Sarajane Jews MD, Ishmael Holter   . Aortic insufficiency   . CAT SCRATCH 09/10/2009    Qualifier: Diagnosis of  By: Regis Bill MD, [name]   . Vision disturbance 03/13/2013    Negative neuro MRA MRI  . Heart palpitations   . Diverticulosis   . HTN (hypertension)     Family History  Problem Relation Age of Onset  . Scleroderma Mother   . Hyperlipidemia Mother   . Coronary artery disease Father 49  . Esophageal cancer Paternal Grandmother    History  Substance Use Topics  . Smoking status: Never Smoker   . Smokeless tobacco: Never Used  . Alcohol Use: No     Comment: never   Current Outpatient Prescriptions  Medication Sig Dispense Refill  . Calcium Carbonate-Vitamin D (CALCIUM PLUS VITAMIN D PO) Take 2 tablets by mouth daily.       [name] diltiazem (CARDIZEM CD) 120 MG 24 hr capsule Take 1 capsule (120 mg total) by mouth daily.  90 capsule  3  . ibuprofen (ADVIL,MOTRIN) 800 MG tablet Take 800 mg by mouth 2 (two) times daily as needed for moderate pain.       . montelukast (SINGULAIR) 10 MG tablet Take 1 tablet (10 mg total) by mouth daily.  90 tablet  3  . Multiple Vitamins-Minerals  (PRESERVISION AREDS PO) Take 1 tablet by mouth 2 (two) times daily.       [name] aspirin EC 81 MG tablet Take 81 mg by mouth daily.      . hydrocortisone (ANUSOL-HC) 25 MG suppository Use 1 suppository at bedtime for 10 nights.  10 suppository  1  . LORazepam (ATIVAN) 0.5 MG tablet Take 0.5 mg by mouth 3 (three) times daily as needed for anxiety.       . Na Sulfate-K Sulfate-Mg Sulf SOLN Take 1 kit by mouth once.  354 mL  0   No current facility-administered medications for this visit.   Allergies  Allergen Reactions  . Codeine Phosphate     drowsy  . Demerol [Meperidine]   . Meperidine Hcl Nausea Only   Review of Systems: All systems reviewed and negative except where noted in HPI.    Physical Exam: BP 100/56  Pulse 96  Ht 5' 4.75" (1.645 m)  Wt 109 lb 6 oz (49.612 kg)  BMI 18.33 kg/m2 Constitutional: Pleasant, thin white female in no acute distress. HEENT: Normocephalic and atraumatic. Conjunctivae are normal. No scleral icterus. Neck supple.  Cardiovascular: Normal rate, regular rhythm.  Pulmonary/chest: Effort normal and breath sounds normal. No wheezing, rales or rhonchi. Abdominal: Soft, nondistended, nontender. Bowel sounds active throughout. There are no masses palpable. No  hepatomegaly. Rectal: large external and internal hemorrhoids. On anoscopy there was some dark red clots of blood.  Extremities: no edema Lymphadenopathy: No cervical adenopathy noted. Neurological: Alert and oriented to person place and time. Skin: Skin is warm and dry. No rashes noted. Psychiatric: Normal mood and affect. Behavior is normal.   ASSESSMENT AND PLAN:   63 year old female presenting with painless hematochezia. This could be diverticular hemorrhage though she does have large hemorrhoids on exam. Patient is just a few months shy of her next colonoscopy for colon cancer screening. Will treat hemorrhoids with steroid suppositories and proceed with colonoscopy.The risks, benefits, and  alternatives to colonoscopy with possible biopsy and possible polypectomy were discussed with the patient and she consents to proceed.

## 2014-04-20 ENCOUNTER — Ambulatory Visit (AMBULATORY_SURGERY_CENTER): Payer: BC Managed Care – PPO | Admitting: Gastroenterology

## 2014-04-20 ENCOUNTER — Encounter: Payer: Self-pay | Admitting: Gastroenterology

## 2014-04-20 VITALS — BP 123/59 | HR 120 | Temp 98.4°F | Resp 24 | Ht 64.0 in | Wt 109.0 lb

## 2014-04-20 DIAGNOSIS — K921 Melena: Secondary | ICD-10-CM

## 2014-04-20 DIAGNOSIS — Z1211 Encounter for screening for malignant neoplasm of colon: Secondary | ICD-10-CM

## 2014-04-20 DIAGNOSIS — K5730 Diverticulosis of large intestine without perforation or abscess without bleeding: Secondary | ICD-10-CM

## 2014-04-20 DIAGNOSIS — K648 Other hemorrhoids: Secondary | ICD-10-CM

## 2014-04-20 MED ORDER — SODIUM CHLORIDE 0.9 % IV SOLN: 500.0000 mL | INTRAVENOUS | Status: DC

## 2014-04-20 NOTE — Progress Notes (Signed)
Reviewed and agree with management.  If it is determined that bleeding is from hemorrhoids would consider band ligation Sandy [place]aam. Deatra Ina, M.D., [facility]

## 2014-04-20 NOTE — Op Note (Signed)
[facility]  [address] [place], [address]   COLONOSCOPY PROCEDURE REPORT  PATIENT: [name], [name]  MR#: [id number] BIRTHDATE: 25-Mar-1951 , 62  yrs. old GENDER: Female ENDOSCOPIST: Inda Castle, MD REFERRED BY: PROCEDURE DATE:  04/20/2014 PROCEDURE:   Colonoscopy, diagnostic First Screening Colonoscopy - Avg.  risk and is 50 yrs.  old or older - No.  Prior Negative Screening - Now for repeat screening. 10 or more years since last screening  History of Adenoma - Now for follow-up colonoscopy & has been > or = to 3 yrs.  N/A  Polyps Removed Today? No.  Recommend repeat exam, <10 yrs? No. ASA CLASS:   Class II INDICATIONS:hematochezia. MEDICATIONS: MAC sedation, administered by CRNA and propofol (Diprivan) 300mg  IV  DESCRIPTION OF PROCEDURE:   After the risks benefits and alternatives of the procedure were thoroughly explained, informed consent was obtained.  A digital rectal exam revealed several skin tags.   The LB [id number]  endoscope was introduced through the anus and advanced to the cecum, which was identified by both the appendix and ileocecal valve. No adverse events experienced. The quality of the prep was excellent using Suprep  The instrument was then slowly withdrawn as the colon was fully examined.      COLON FINDINGS: Mild diverticulosis was noted in the sigmoid colon and descending colon.   Internal hemorrhoids were found.   The colon was otherwise normal.  There was no diverticulosis, inflammation, polyps or cancers unless previously stated. Retroflexed views revealed no abnormalities. The time to cecum=11 minutes 02 seconds.  Withdrawal time=6 minutes 19 seconds.  The scope was withdrawn and the procedure completed. COMPLICATIONS: There were no complications.  ENDOSCOPIC IMPRESSION: 1.   Mild diverticulosis was noted in the sigmoid colon and descending colon 2.   Internal hemorrhoids 3.   The colon was otherwise  normal  limited rectal bleeding secondary to hemorrhoids  RECOMMENDATIONS: band ligation of internal hemorrhoids Colonoscopy 10 years  eSigned:  Inda Castle, MD 04/20/2014 2:20 PM   cc: Burnis Medin, MD   PATIENT NAME:  [name], [name] MR#: [id number]

## 2014-04-20 NOTE — Telephone Encounter (Signed)
Called and LM for patient

## 2014-04-20 NOTE — Patient Instructions (Signed)
Discharge instructions given with verbal understanding. handouts on diverticulosis and hemorrhoids. Resume previous medications. YOU HAD AN ENDOSCOPIC PROCEDURE TODAY AT [facility]: Refer to the procedure report that was given to you for any specific questions about what was found during the examination.  If the procedure report does not answer your questions, please call your gastroenterologist to clarify.  If you requested that your care partner not be given the details of your procedure findings, then the procedure report has been included in a sealed envelope for you to review at your convenience later.  YOU SHOULD EXPECT: Some feelings of bloating in the abdomen. Passage of more gas than usual.  Walking can help get rid of the air that was put into your GI tract during the procedure and reduce the bloating. If you had a lower endoscopy (such as a colonoscopy or flexible sigmoidoscopy) you may notice spotting of blood in your stool or on the toilet paper. If you underwent a bowel prep for your procedure, then you may not have a normal bowel movement for a few days.  DIET: Your first meal following the procedure should be a light meal and then it is ok to progress to your normal diet.  A half-sandwich or bowl of soup is an example of a good first meal.  Heavy or fried foods are harder to digest and may make you feel nauseous or bloated.  Likewise meals heavy in dairy and vegetables can cause extra gas to form and this can also increase the bloating.  Drink plenty of fluids but you should avoid alcoholic beverages for 24 hours.  ACTIVITY: Your care partner should take you home directly after the procedure.  You should plan to take it easy, moving slowly for the rest of the day.  You can resume normal activity the day after the procedure however you should NOT DRIVE or use heavy machinery for 24 hours (because of the sedation medicines used during the test).    SYMPTOMS TO REPORT  IMMEDIATELY: A gastroenterologist can be reached at any hour.  During normal business hours, 8:30 AM to 5:00 PM Monday through Friday, call [phone].  After hours and on weekends, please call the GI answering service at [phone] who will take a message and have the physician on call contact you.   Following lower endoscopy (colonoscopy or flexible sigmoidoscopy):  Excessive amounts of blood in the stool  Significant tenderness or worsening of abdominal pains  Swelling of the abdomen that is new, acute  Fever of 100F or higher  FOLLOW UP: If any biopsies were taken you will be contacted by phone or by letter within the next 1-3 weeks.  Call your gastroenterologist if you have not heard about the biopsies in 3 weeks.  Our staff will call the home number listed on your records the next business day following your procedure to check on you and address any questions or concerns that you may have at that time regarding the information given to you following your procedure. This is a courtesy call and so if there is no answer at the home number and we have not heard from you through the emergency physician on call, we will assume that you have returned to your regular daily activities without incident.  SIGNATURES/CONFIDENTIALITY: You and/or your care partner have signed paperwork which will be entered into your electronic medical record.  These signatures attest to the fact that that the information above on your After Visit Summary  has been reviewed and is understood.  Full responsibility of the confidentiality of this discharge information lies with you and/or your care-partner.

## 2014-04-20 NOTE — Progress Notes (Signed)
Procedure ends, to recovery, report given and VSS. 

## 2014-04-21 ENCOUNTER — Ambulatory Visit (INDEPENDENT_AMBULATORY_CARE_PROVIDER_SITE_OTHER): Payer: BC Managed Care – PPO | Admitting: Gastroenterology

## 2014-04-21 ENCOUNTER — Telehealth: Payer: Self-pay | Admitting: *Deleted

## 2014-04-21 VITALS — BP 106/62 | HR 64 | Ht 65.0 in | Wt 107.0 lb

## 2014-04-21 DIAGNOSIS — K649 Unspecified hemorrhoids: Secondary | ICD-10-CM

## 2014-04-21 NOTE — Telephone Encounter (Signed)
No answer, left message to call office if questions or concerns. 

## 2014-04-21 NOTE — Progress Notes (Signed)
PROCEDURE NOTE: The patient presents with symptomatic grade *3**  hemorrhoids, requesting rubber band ligation of his/her hemorrhoidal disease.  All risks, benefits and alternative forms of therapy were described and informed consent was obtained.   The anorectum was pre-medicated with lubricant and nitroglycerine ointment The decision was made to band the *right posterior** internal hemorrhoid, and the [facility] O'Regan System was used to perform band ligation without complication.  Digital anorectal examination was then performed to assure proper positioning of the band, and to adjust the banded tissue as required.  The patient was discharged home without pain or other issues.  Dietary and behavioral recommendations were given and along with follow-up instructions.    The patient will return in *2** weeks for  follow-up and possible additional banding as required. No complications were encountered and the patient tolerated the procedure well.   

## 2014-04-21 NOTE — Patient Instructions (Signed)
HEMORRHOID BANDING PROCEDURE    FOLLOW-UP CARE   1. The procedure you have had should have been relatively painless since the banding of the area involved does not have nerve endings and there is no pain sensation.  The rubber band cuts off the blood supply to the hemorrhoid and the band may fall off as soon as 48 hours after the banding (the band may occasionally be seen in the toilet bowl following a bowel movement). You may notice a temporary feeling of fullness in the rectum which should respond adequately to plain Tylenol or Motrin.  2. Following the banding, avoid strenuous exercise that evening and resume full activity the next day.  A sitz bath (soaking in a warm tub) or bidet is soothing, and can be useful for cleansing the area after bowel movements.     3. To avoid constipation, take two tablespoons of natural wheat bran, natural oat bran, flax, Benefiber or any over the counter fiber supplement and increase your water intake to 7-8 glasses daily.    4. Unless you have been prescribed anorectal medication, do not put anything inside your rectum for two weeks: No suppositories, enemas, fingers, etc.  5. Occasionally, you may have more bleeding than usual after the banding procedure.  This is often from the untreated hemorrhoids rather than the treated one.  Don't be concerned if there is a tablespoon or so of blood.  If there is more blood than this, lie flat with your bottom higher than your head and apply an ice pack to the area. If the bleeding does not stop within a half an hour or if you feel faint, call our office at [phone] or go to the emergency room.  6. Problems are not common; however, if there is a substantial amount of bleeding, severe pain, chills, fever or difficulty passing urine (very rare) or other problems, you should call us at [phone] or report to the nearest emergency room.  7. Do not stay seated continuously for more than 2-3 hours for a day or two  after the procedure.  Tighten your buttock muscles 10-15 times every two hours and take 10-15 deep breaths every 1-2 hours.  Do not spend more than a few minutes on the toilet if you cannot empty your bowel; instead re-visit the toilet at a later time.   Your 2nd banding is scheduled on 06/17/2014 at 9:30am

## 2014-06-17 ENCOUNTER — Ambulatory Visit (INDEPENDENT_AMBULATORY_CARE_PROVIDER_SITE_OTHER): Payer: BC Managed Care – PPO | Admitting: Gastroenterology

## 2014-06-17 ENCOUNTER — Encounter: Payer: Self-pay | Admitting: Gastroenterology

## 2014-06-17 VITALS — BP 114/68 | HR 80 | Ht 64.75 in | Wt 110.5 lb

## 2014-06-17 DIAGNOSIS — K649 Unspecified hemorrhoids: Secondary | ICD-10-CM

## 2014-06-17 NOTE — Patient Instructions (Signed)
HEMORRHOID BANDING PROCEDURE    FOLLOW-UP CARE   1. The procedure you have had should have been relatively painless since the banding of the area involved does not have nerve endings and there is no pain sensation.  The rubber band cuts off the blood supply to the hemorrhoid and the band may fall off as soon as 48 hours after the banding (the band may occasionally be seen in the toilet bowl following a bowel movement). You may notice a temporary feeling of fullness in the rectum which should respond adequately to plain Tylenol or Motrin.  2. Following the banding, avoid strenuous exercise that evening and resume full activity the next day.  A sitz bath (soaking in a warm tub) or bidet is soothing, and can be useful for cleansing the area after bowel movements.     3. To avoid constipation, take two tablespoons of natural wheat bran, natural oat bran, flax, Benefiber or any over the counter fiber supplement and increase your water intake to 7-8 glasses daily.    4. Unless you have been prescribed anorectal medication, do not put anything inside your rectum for two weeks: No suppositories, enemas, fingers, etc.  5. Occasionally, you may have more bleeding than usual after the banding procedure.  This is often from the untreated hemorrhoids rather than the treated one.  Don't be concerned if there is a tablespoon or so of blood.  If there is more blood than this, lie flat with your bottom higher than your head and apply an ice pack to the area. If the bleeding does not stop within a half an hour or if you feel faint, call our office at [phone] or go to the emergency room.  6. Problems are not common; however, if there is a substantial amount of bleeding, severe pain, chills, fever or difficulty passing urine (very rare) or other problems, you should call us at [phone] or report to the nearest emergency room.  7. Do not stay seated continuously for more than 2-3 hours for a day or two  after the procedure.  Tighten your buttock muscles 10-15 times every two hours and take 10-15 deep breaths every 1-2 hours.  Do not spend more than a few minutes on the toilet if you cannot empty your bowel; instead re-visit the toilet at a later time.   Your 3rd banding is scheduled on 08/19/2014 at 9:45am

## 2014-06-17 NOTE — Progress Notes (Signed)
PROCEDURE NOTE: The patient presents with symptomatic grade **3*  hemorrhoids, requesting rubber band ligation of his/her hemorrhoidal disease.  All risks, benefits and alternative forms of therapy were described and informed consent was obtained.   The anorectum was pre-medicated with lubricant and nitroglycerine ointment The decision was made to band the **right anterior* internal hemorrhoid, and the [facility] O'Regan System was used to perform band ligation without complication.  Digital anorectal examination was then performed to assure proper positioning of the band, and to adjust the banded tissue as required.  The patient was discharged home without pain or other issues.  Dietary and behavioral recommendations were given and along with follow-up instructions.    The patient will return in *2** weeks for  follow-up and possible additional banding as required. No complications were encountered and the patient tolerated the procedure well.   

## 2014-06-18 LAB — HM MAMMOGRAPHY

## 2014-06-19 ENCOUNTER — Encounter: Payer: Self-pay | Admitting: Internal Medicine

## 2014-07-02 ENCOUNTER — Encounter: Payer: Self-pay | Admitting: Internal Medicine

## 2014-07-16 ENCOUNTER — Telehealth (HOSPITAL_COMMUNITY): Payer: Self-pay | Admitting: *Deleted

## 2014-07-16 NOTE — Telephone Encounter (Signed)
Pt. Is having some issues with her insurance and the ECHO we ordered, read previous note

## 2014-07-16 NOTE — Telephone Encounter (Signed)
This patients insurance will not cover her echocardiogram because it hasnt been 3 years since last one. She would like to know what she should do from here. She is unsure if she should go ahead and pay for the test out of pocket or just wait until next year. She wants to know what Dr.Hochrein thinks.

## 2014-07-20 NOTE — Telephone Encounter (Signed)
I left this patient a voicemail relaying the message from Dr. Percival Spanish and stated that if she had questions to please call back.

## 2014-07-20 NOTE — Telephone Encounter (Signed)
I believe that I already answered this.  I will not order an echo this year.  She should have an appt in October and can talk to her about it then.

## 2014-07-23 ENCOUNTER — Ambulatory Visit (HOSPITAL_COMMUNITY): Payer: Self-pay

## 2014-07-29 ENCOUNTER — Encounter: Payer: Self-pay | Admitting: Internal Medicine

## 2014-08-05 ENCOUNTER — Encounter: Payer: Self-pay | Admitting: Cardiology

## 2014-08-05 ENCOUNTER — Telehealth: Payer: Self-pay | Admitting: Gastroenterology

## 2014-08-05 ENCOUNTER — Ambulatory Visit (INDEPENDENT_AMBULATORY_CARE_PROVIDER_SITE_OTHER): Payer: BC Managed Care – PPO | Admitting: Cardiology

## 2014-08-05 ENCOUNTER — Other Ambulatory Visit: Payer: Self-pay | Admitting: *Deleted

## 2014-08-05 VITALS — BP 120/80 | HR 66 | Ht 65.0 in | Wt 111.0 lb

## 2014-08-05 DIAGNOSIS — R002 Palpitations: Secondary | ICD-10-CM

## 2014-08-05 DIAGNOSIS — I351 Nonrheumatic aortic (valve) insufficiency: Secondary | ICD-10-CM

## 2014-08-05 MED ORDER — DILTIAZEM HCL ER COATED BEADS 120 MG PO CP24: 120.0000 mg | ORAL_CAPSULE | Freq: Every day | ORAL | Status: DC

## 2014-08-05 NOTE — Patient Instructions (Signed)
Your physician recommends that you schedule a follow-up appointment in: one year with Dr. Percival Spanish  We are scheduling an Echo

## 2014-08-05 NOTE — Progress Notes (Signed)
HPI The patient presents for follow up of palpitations and aortic insufficiency. Since I last saw her she has done well. The patient denies any new symptoms such as chest discomfort, neck or arm discomfort. There has been no new shortness of breath, PND or orthopnea. There has been no presyncope or syncope.  .  She does exercise.  She does feel some palpitations at times and she has associated these with eating cheese.    Allergies  Allergen Reactions  . Codeine Phosphate     drowsy  . Demerol [Meperidine]   . Meperidine Hcl Nausea Only    Current Outpatient Prescriptions  Medication Sig Dispense Refill  . aspirin EC 81 MG tablet Take 81 mg by mouth daily.      . Calcium Carbonate-Vitamin D (CALCIUM PLUS VITAMIN D PO) Take 2 tablets by mouth daily.       [name] ibuprofen (ADVIL,MOTRIN) 800 MG tablet Take 800 mg by mouth 2 (two) times daily as needed for moderate pain.       [name] LORazepam (ATIVAN) 0.5 MG tablet Take 0.5 mg by mouth 3 (three) times daily as needed for anxiety.       . montelukast (SINGULAIR) 10 MG tablet Take 1 tablet (10 mg total) by mouth daily.  90 tablet  3  . Multiple Vitamins-Minerals (PRESERVISION AREDS PO) Take 1 tablet by mouth 2 (two) times daily.       [name] diltiazem (CARDIZEM CD) 120 MG 24 hr capsule Take 1 capsule (120 mg total) by mouth daily.  90 capsule  3   No current facility-administered medications for this visit.    Past Medical History  Diagnosis Date  . Osteopenia   . Allergic rhinitis   . Clavicle fracture 3/08    left  . CHEST WALL PAIN, ACUTE 07/21/2010    Qualifier: Diagnosis of  By: Sarajane Jews MD, Ishmael Holter   . Aortic insufficiency   . CAT SCRATCH 09/10/2009    Qualifier: Diagnosis of  By: Regis Bill MD, [name]   . Vision disturbance 03/13/2013    Negative neuro MRA MRI  . Heart palpitations   . Diverticulosis   . HTN (hypertension)     Past Surgical History  Procedure Laterality Date  . Tonsillectomy and adenoidectomy    . Laparoscopic salpingo  oopherectomy Right   . Appendectomy    . Abdominal hysterectomy      pt denies 04/15/14    ROS:  Positive for palpitations, seasonal allergies, varicose veins. Otherwise as stated in the HPI and negative for all other systems.  PHYSICAL EXAM BP 120/80  Pulse 66  Ht 5\' 5"  (1.651 m)  Wt 111 lb (50.349 kg)  BMI 18.47 kg/m2 GENERAL:  Well appearing NECK:  No jugular venous distention, waveform within normal limits, carotid upstroke brisk and symmetric, no bruits, no thyromegaly LUNGS:  Clear to auscultation bilaterally CHEST:  Unremarkable HEART:  PMI not displaced or sustained,S1 and S2 within normal limits, no S3, no S4, no clicks, no rubs, diastolic murmur mid to late at the left 3rd intercostal space. ABD:  Flat, positive bowel sounds normal in frequency in pitch, no bruits, no rebound, no guarding, no midline pulsatile mass, no hepatomegaly, no splenomegaly EXT:  2 plus pulses throughout, no edema, no cyanosis no clubbing  EKG:  Sinus rhythm, rate 66, axis within normal limits, intervals within normal limits, no acute ST-T wave changes.  08/05/2014  ASSESSMENT AND PLAN  Aortic insufficiency -  I will order an echo  this year to follow up.  No change in therapy at this point.    Palpitations -  These are mild and might be associated with certain foods.  We discussed avoidance.  No change in therapy is indicated.   ELEVATED BLOOD PRESSURE WITHOUT DIAGNOSIS OF HYPERTENSION -  Her BP at home is ok.  Previous blood pressure readings have been fine. No change in therapy is indicated.

## 2014-08-07 NOTE — Telephone Encounter (Signed)
Patient has decided to leave her appointment for 10/21 for the banding but would prefer a Nov appointment

## 2014-08-13 ENCOUNTER — Ambulatory Visit (HOSPITAL_COMMUNITY)
Admission: RE | Admit: 2014-08-13 | Discharge: 2014-08-13 | Disposition: A | Discharge status: Home / Self Care | Payer: BC Managed Care – PPO | Source: Ambulatory Visit | Attending: Internal Medicine | Admitting: Internal Medicine

## 2014-08-13 DIAGNOSIS — I350 Nonrheumatic aortic (valve) stenosis: Secondary | ICD-10-CM | POA: Diagnosis not present

## 2014-08-13 DIAGNOSIS — R002 Palpitations: Secondary | ICD-10-CM

## 2014-08-13 DIAGNOSIS — I351 Nonrheumatic aortic (valve) insufficiency: Secondary | ICD-10-CM

## 2014-08-13 DIAGNOSIS — I359 Nonrheumatic aortic valve disorder, unspecified: Secondary | ICD-10-CM

## 2014-08-13 NOTE — Progress Notes (Signed)
2D Echocardiogram Complete.  08/13/2014   [name] [name], [place]

## 2014-08-19 ENCOUNTER — Ambulatory Visit (INDEPENDENT_AMBULATORY_CARE_PROVIDER_SITE_OTHER): Payer: BC Managed Care – PPO | Admitting: Gastroenterology

## 2014-08-19 ENCOUNTER — Encounter: Payer: Self-pay | Admitting: Gastroenterology

## 2014-08-19 VITALS — BP 138/72 | HR 76 | Ht 64.75 in | Wt 112.2 lb

## 2014-08-19 DIAGNOSIS — K649 Unspecified hemorrhoids: Secondary | ICD-10-CM

## 2014-08-19 MED ORDER — HYOSCYAMINE SULFATE 0.125 MG PO TABS: 0.1250 mg | ORAL_TABLET | ORAL | Status: DC | PRN

## 2014-08-19 NOTE — Addendum Note (Signed)
Addended by: Oda Kilts on: 08/19/2014 02:36 PM   Modules accepted: Orders

## 2014-08-19 NOTE — Patient Instructions (Signed)
HEMORRHOID BANDING PROCEDURE    FOLLOW-UP CARE   1. The procedure you have had should have been relatively painless since the banding of the area involved does not have nerve endings and there is no pain sensation.  The rubber band cuts off the blood supply to the hemorrhoid and the band may fall off as soon as 48 hours after the banding (the band may occasionally be seen in the toilet bowl following a bowel movement). You may notice a temporary feeling of fullness in the rectum which should respond adequately to plain Tylenol or Motrin.  2. Following the banding, avoid strenuous exercise that evening and resume full activity the next day.  A sitz bath (soaking in a warm tub) or bidet is soothing, and can be useful for cleansing the area after bowel movements.     3. To avoid constipation, take two tablespoons of natural wheat bran, natural oat bran, flax, Benefiber or any over the counter fiber supplement and increase your water intake to 7-8 glasses daily.    4. Unless you have been prescribed anorectal medication, do not put anything inside your rectum for two weeks: No suppositories, enemas, fingers, etc.  5. Occasionally, you may have more bleeding than usual after the banding procedure.  This is often from the untreated hemorrhoids rather than the treated one.  Don't be concerned if there is a tablespoon or so of blood.  If there is more blood than this, lie flat with your bottom higher than your head and apply an ice pack to the area. If the bleeding does not stop within a half an hour or if you feel faint, call our office at [phone] or go to the emergency room.  6. Problems are not common; however, if there is a substantial amount of bleeding, severe pain, chills, fever or difficulty passing urine (very rare) or other problems, you should call us at [phone] or report to the nearest emergency room.  7. Do not stay seated continuously for more than 2-3 hours for a day or two  after the procedure.  Tighten your buttock muscles 10-15 times every two hours and take 10-15 deep breaths every 1-2 hours.  Do not spend more than a few minutes on the toilet if you cannot empty your bowel; instead re-visit the toilet at a later time.   Your follow up appointment is scheduled on 10/05/2014 at 1:45pm

## 2014-08-19 NOTE — Progress Notes (Addendum)
PROCEDURE NOTE: The patient presents with symptomatic grade **3*  hemorrhoids, requesting rubber band ligation of his/her hemorrhoidal disease.  All risks, benefits and alternative forms of therapy were described and informed consent was obtained.   The anorectum was pre-medicated with lubricant and nitroglycerine ointment The decision was made to band the *left lateral** internal hemorrhoid, and the [place] was used to perform band ligation without complication.  Digital anorectal examination was then performed to assure proper positioning of the band, and to adjust the banded tissue as required.  The patient was discharged home without pain or other issues.  Dietary and behavioral recommendations were given and along with follow-up instructions.    The patient will return in *4** weeks for  follow-up and possible additional banding as required. No complications were encountered and the patient tolerated the procedure well.  Patient has had persistent low-grade discomfort since the procedure.  We waited several hours.  Because of persistent discomfort the band was readjusted and loosened.  Patient is now pain-free.

## 2014-08-20 NOTE — Progress Notes (Signed)
Pt. Called and informed about her echo

## 2014-08-24 ENCOUNTER — Telehealth: Payer: Self-pay | Admitting: Cardiology

## 2014-08-24 NOTE — Telephone Encounter (Signed)
Please call,concerning her test results from last week. She said you spoke to her husband,butt she needs to talk to you.

## 2014-08-24 NOTE — Telephone Encounter (Signed)
I have talked to this pt. And discussed her echo results

## 2014-09-03 ENCOUNTER — Telehealth: Payer: Self-pay | Admitting: Gastroenterology

## 2014-09-03 ENCOUNTER — Encounter: Payer: Self-pay | Admitting: Physician Assistant

## 2014-09-03 ENCOUNTER — Other Ambulatory Visit (INDEPENDENT_AMBULATORY_CARE_PROVIDER_SITE_OTHER): Payer: BC Managed Care – PPO

## 2014-09-03 ENCOUNTER — Ambulatory Visit (INDEPENDENT_AMBULATORY_CARE_PROVIDER_SITE_OTHER): Payer: BC Managed Care – PPO | Admitting: Physician Assistant

## 2014-09-03 VITALS — BP 126/68 | HR 72 | Ht 64.75 in | Wt 110.2 lb

## 2014-09-03 DIAGNOSIS — K625 Hemorrhage of anus and rectum: Secondary | ICD-10-CM

## 2014-09-03 DIAGNOSIS — K626 Ulcer of anus and rectum: Secondary | ICD-10-CM

## 2014-09-03 LAB — CBC WITH DIFFERENTIAL/PLATELET
BASOS PCT: 0.4 % (ref 0.0–3.0)
Basophils Absolute: 0 10*3/uL (ref 0.0–0.1)
EOS ABS: 0.1 10*3/uL (ref 0.0–0.7)
Eosinophils Relative: 1.5 % (ref 0.0–5.0)
HCT: 41.6 % (ref 36.0–46.0)
Hemoglobin: 13.6 g/dL (ref 12.0–15.0)
Lymphocytes Relative: 36.8 % (ref 12.0–46.0)
Lymphs Abs: 2.7 10*3/uL (ref 0.7–4.0)
MCHC: 32.7 g/dL (ref 30.0–36.0)
MCV: 94 fl (ref 78.0–100.0)
MONO ABS: 0.6 10*3/uL (ref 0.1–1.0)
Monocytes Relative: 8.6 % (ref 3.0–12.0)
NEUTROS PCT: 52.7 % (ref 43.0–77.0)
Neutro Abs: 3.9 10*3/uL (ref 1.4–7.7)
Platelets: 246 10*3/uL (ref 150.0–400.0)
RBC: 4.43 Mil/uL (ref 3.87–5.11)
RDW: 15.4 % (ref 11.5–15.5)
WBC: 7.4 10*3/uL (ref 4.0–10.5)

## 2014-09-03 NOTE — Progress Notes (Signed)
Subjective:    Patient ID: [name] [name], female    DOB: 1951-03-14, 63 y.o.   MRN: [id number]  HPI  Ludia is a pleasant 63 year old white female known to Dr. Deatra Ina who has undergone a total of 3 hemorrhoidal banding sessions over the past couple of months. The last banding was done on 08/19/2014 in the left lateral position. Patient did experience pain after the banding and came back to the office at which point the band was repositioned and loosened. She said she had a little bit of discomfort after that for a couple of hours but then has not had any rectal pain since. She did fine until Tuesday, 09/01/2014 when she first noted a little bit of blood with a bowel movement. She says she has been seeing a little bit of bright red blood with wiping. She did not notice any bleeding yesterday but today ,saw bright red blood on the outside of a normal-appearing stool. There are no complaints of rectal discomfort and she  has just had that 1 bowel movement today. She is concerned because she is supposed to be going out of town Sunday or Monday for a funeral and then on a trip later next week.    Review of Systems  Constitutional: Negative.   HENT: Negative.   Eyes: Negative.   Respiratory: Negative.   Cardiovascular: Negative.   Gastrointestinal: Positive for blood in stool and anal bleeding.  Endocrine: Negative.   Genitourinary: Negative.   Musculoskeletal: Negative.   Skin: Negative.   Allergic/Immunologic: Negative.   Neurological: Negative.   Hematological: Negative.   Psychiatric/Behavioral: Negative.    Outpatient Prescriptions Prior to Visit  Medication Sig Dispense Refill  . aspirin EC 81 MG tablet Take 81 mg by mouth daily.    . Calcium Carbonate-Vitamin D (CALCIUM PLUS VITAMIN D PO) Take 2 tablets by mouth daily.     [name] diltiazem (CARDIZEM CD) 120 MG 24 hr capsule Take 1 capsule (120 mg total) by mouth daily. 90 capsule 3  . ibuprofen (ADVIL,MOTRIN) 800 MG tablet Take 800 mg by  mouth 2 (two) times daily as needed for moderate pain.     [name] LORazepam (ATIVAN) 0.5 MG tablet Take 0.5 mg by mouth 3 (three) times daily as needed for anxiety.     . montelukast (SINGULAIR) 10 MG tablet Take 1 tablet (10 mg total) by mouth daily. 90 tablet 3  . Multiple Vitamins-Minerals (PRESERVISION AREDS PO) Take 1 tablet by mouth 2 (two) times daily.     . hyoscyamine (LEVSIN, ANASPAZ) 0.125 MG tablet Take 1 tablet (0.125 mg total) by mouth every 4 (four) hours as needed. 30 tablet 0   No facility-administered medications prior to visit.   Allergies  Allergen Reactions  . Codeine Phosphate     drowsy  . Demerol [Meperidine]   . Meperidine Hcl Nausea Only   Patient Active Problem List   Diagnosis Date Noted  . Hemorrhage of rectum and anus 04/16/2014  . Special screening for malignant neoplasms, colon 04/16/2014  . Hemorrhoids, unspecified hemorrhoid type 04/16/2014  . Plantar wart of left foot 05/14/2013  . Foot callus 05/14/2013  . Bilateral bunions 03/13/2013  . Hypertrophic toenail 03/13/2013  . Vision disturbance 03/13/2013  . Sore throat, chronic 05/05/2012  . Post-nasal drainage 05/05/2012  . Aortic insufficiency 01/16/2012  . Palpitations 12/04/2011  . Macular degeneration 10/12/2011  . ANXIETY, SITUATIONAL 05/08/2008  . CONTACT DERMATITIS&OTHER ECZEMA DUE UNSPEC CAUSE 05/08/2008  . VITAMIN D DEFICIENCY 05/22/2007  .  ELEVATED BLOOD PRESSURE WITHOUT DIAGNOSIS OF HYPERTENSION 05/22/2007  . Allergic rhinitis due to allergen 03/28/2007   History  Substance Use Topics  . Smoking status: Never Smoker   . Smokeless tobacco: Never Used  . Alcohol Use: No     Comment: never   family history includes Coronary artery disease (age of onset: 70) in her father; Esophageal cancer in her paternal grandmother; Hyperlipidemia in her mother; Scleroderma in her mother.     Objective:   Physical Exam  Well-developed older white female in no acute distress, pleasant blood pressure  126/68 pulse 72 height 5 foot 4 weight 110. Cardiovascular; regular rate and rhythm with S1-S2, Pulmonary; clear bilaterally, Abdomen; soft benign, Recta; exam no external lesions noted on digital exam she has dark brown stool mixed with dark blood, nontender to exam        Assessment & Plan:  #13  63 year old female with 3 day history of intermittent rectal bleeding. This is occurring 2 weeks after hemorrhoidal banding. Suspect she has an ulceration at the banding site. She is clearly not hemorrhaging at this time but has been oozing slowly  Plan; Hold baby aspirin 1 week or until bleeding has completely resolved, she is on this prophylactically Check CBC today Will observe for now,, patient advised that if the bleeding should increase over the next few days she will require a flexible sigmoidoscopy and treatment of the ulceration site for control of bleeding.  We will check on her tomorrow.

## 2014-09-03 NOTE — Telephone Encounter (Signed)
Patient is already here for her appointment.

## 2014-09-03 NOTE — Patient Instructions (Signed)
Please go to the basement level to have your labs drawn.  Stop the baby aspirin for the next week.  Call us back tomorrow or Monday if the bleeding increases.  Dr. Deatra Ina will be in the office tomorrow Friday 11-6 and Monday 11-9.

## 2014-09-03 NOTE — Telephone Encounter (Signed)
This is very likely hemorrhoidal bleeding.  She can try a plain hemorrhoid suppository.  There is no urgency for an office visit.

## 2014-09-03 NOTE — Telephone Encounter (Signed)
She has seen blood on the bowel movement. It just started this week. There was a little blood on the tissue paper today. She has an area that feels a little chapped but no rectal pain. She will not be available at all next week. Is this something that needs to be looked at today or tomorrow?

## 2014-09-04 NOTE — Progress Notes (Signed)
Reviewed and agree with management. Kensli Bowley D. Valecia Beske, M.D., FACG  

## 2014-10-05 ENCOUNTER — Encounter: Payer: Self-pay | Admitting: Gastroenterology

## 2014-10-05 ENCOUNTER — Ambulatory Visit (INDEPENDENT_AMBULATORY_CARE_PROVIDER_SITE_OTHER): Payer: Self-pay | Admitting: Gastroenterology

## 2014-10-05 VITALS — BP 116/60 | HR 60 | Ht 64.75 in | Wt 112.2 lb

## 2014-10-05 DIAGNOSIS — K649 Unspecified hemorrhoids: Secondary | ICD-10-CM

## 2014-10-05 DIAGNOSIS — K625 Hemorrhage of anus and rectum: Secondary | ICD-10-CM

## 2014-10-05 NOTE — Assessment & Plan Note (Signed)
Status post hemorrhoid banding 3, now asymptomatic.  Patient was encouraged to try to move her bowels regularly and to use fiber regularly if she becomes constipated.

## 2014-10-05 NOTE — Patient Instructions (Signed)
Follow up as needed

## 2014-10-05 NOTE — Progress Notes (Signed)
Patient is here for post procedure check.  She's undergone band ligation of internal hemorrhoids 3.  She is now asymmetric and has no lower GI complaints including pain or bleeding.

## 2014-10-15 ENCOUNTER — Other Ambulatory Visit (INDEPENDENT_AMBULATORY_CARE_PROVIDER_SITE_OTHER): Payer: BC Managed Care – PPO

## 2014-10-15 DIAGNOSIS — Z0000 Encounter for general adult medical examination without abnormal findings: Secondary | ICD-10-CM

## 2014-10-15 LAB — LIPID PANEL
Cholesterol: 209 mg/dL — ABNORMAL HIGH (ref 0–200)
HDL: 72.5 mg/dL (ref >39.00)
LDL Cholesterol: 124 mg/dL — ABNORMAL HIGH (ref 0–99)
NONHDL: 136.5
Total CHOL/HDL Ratio: 3
Triglycerides: 61 mg/dL (ref 0.0–149.0)
VLDL: 12.2 mg/dL (ref 0.0–40.0)

## 2014-10-15 LAB — HEPATIC FUNCTION PANEL
ALBUMIN: 4.1 g/dL (ref 3.5–5.2)
ALT: 16 U/L (ref 0–35)
AST: 23 U/L (ref 0–37)
Alkaline Phosphatase: 40 U/L (ref 39–117)
BILIRUBIN TOTAL: 0.6 mg/dL (ref 0.2–1.2)
Bilirubin, Direct: 0 mg/dL (ref 0.0–0.3)
Total Protein: 7.1 g/dL (ref 6.0–8.3)

## 2014-10-15 LAB — BASIC METABOLIC PANEL
BUN: 20 mg/dL (ref 6–23)
CO2: 26 mEq/L (ref 19–32)
Calcium: 9.5 mg/dL (ref 8.4–10.5)
Chloride: 107 mEq/L (ref 96–112)
Creatinine, Ser: 0.7 mg/dL (ref 0.4–1.2)
GFR: 88.66 mL/min (ref >60.00)
Glucose, Bld: 91 mg/dL (ref 70–99)
POTASSIUM: 4.9 meq/L (ref 3.5–5.1)
SODIUM: 141 meq/L (ref 135–145)

## 2014-10-15 LAB — CBC WITH DIFFERENTIAL/PLATELET
BASOS PCT: 0.5 % (ref 0.0–3.0)
Basophils Absolute: 0 10*3/uL (ref 0.0–0.1)
EOS PCT: 1.8 % (ref 0.0–5.0)
Eosinophils Absolute: 0.1 10*3/uL (ref 0.0–0.7)
HEMATOCRIT: 42 % (ref 36.0–46.0)
HEMOGLOBIN: 13.7 g/dL (ref 12.0–15.0)
Lymphocytes Relative: 25.9 % (ref 12.0–46.0)
Lymphs Abs: 1.6 10*3/uL (ref 0.7–4.0)
MCHC: 32.6 g/dL (ref 30.0–36.0)
MCV: 97.3 fl (ref 78.0–100.0)
MONO ABS: 0.5 10*3/uL (ref 0.1–1.0)
MONOS PCT: 8.8 % (ref 3.0–12.0)
NEUTROS ABS: 3.8 10*3/uL (ref 1.4–7.7)
Neutrophils Relative %: 63 % (ref 43.0–77.0)
Platelets: 242 10*3/uL (ref 150.0–400.0)
RBC: 4.32 Mil/uL (ref 3.87–5.11)
RDW: 13.8 % (ref 11.5–15.5)
WBC: 6.1 10*3/uL (ref 4.0–10.5)

## 2014-10-15 LAB — TSH: TSH: 1.04 u[IU]/mL (ref 0.35–4.50)

## 2014-10-28 ENCOUNTER — Ambulatory Visit (INDEPENDENT_AMBULATORY_CARE_PROVIDER_SITE_OTHER): Payer: BC Managed Care – PPO | Admitting: Internal Medicine

## 2014-10-28 ENCOUNTER — Encounter: Payer: Self-pay | Admitting: Internal Medicine

## 2014-10-28 VITALS — BP 120/66 | Temp 97.9°F | Ht 64.75 in | Wt 111.5 lb

## 2014-10-28 DIAGNOSIS — M8580 Other specified disorders of bone density and structure, unspecified site: Secondary | ICD-10-CM

## 2014-10-28 DIAGNOSIS — Z0000 Encounter for general adult medical examination without abnormal findings: Secondary | ICD-10-CM

## 2014-10-28 DIAGNOSIS — I351 Nonrheumatic aortic (valve) insufficiency: Secondary | ICD-10-CM

## 2014-10-28 DIAGNOSIS — J309 Allergic rhinitis, unspecified: Secondary | ICD-10-CM

## 2014-10-28 MED ORDER — LORAZEPAM 0.5 MG PO TABS: 0.5000 mg | ORAL_TABLET | Freq: Three times a day (TID) | ORAL | Status: DC | PRN

## 2014-10-28 NOTE — Progress Notes (Signed)
Pre visit review using our clinic review tool, if applicable. No additional management support is needed unless otherwise documented below in the visit note.  Chief Complaint  Patient presents with  . Annual Exam    HPI: Patient  [name] [name]  63 y.o. comes in today for Preventive Health Care visit    Bp: better in recent  months .  Cards :  Palpitation every few months  Last hours  Echo q 2 years  No change in AI  Hemorrhoid band  Procedure  12/ 15  Uses lora only for lfying and has bottle of expired pills from 2 years ago   Cough when lays down  No cp sob s? Drainage  No nasal stuffiness sob.  Health Maintenance  Topic Date Due  . INFLUENZA VACCINE  05/31/2015  . PAP SMEAR  12/31/2015  . MAMMOGRAM  06/18/2016  . TETANUS/TDAP  05/21/2017  . COLONOSCOPY  04/20/2024  . ZOSTAVAX  Completed   Health Maintenance Review LIFESTYLE:  Exercise:   class 1-2 per week and walking .  Tobacco/ETS: no Alcohol:  never Sugar beverages:  1-2 per month  Sleep: 6.5-7.5 Drug use: no Bone density: ? 2011 ? due Colonoscopy:  Yes  Hemorrhoids  2015 PAP:  Due 2017  MAMMO:2015   ROS:  GEN/ HEENT: No fever, significant weight changes sweats headaches vision problems hearing changes, CV/ PULM; No chest pain shortness of breath cough, syncope,edema  change in exercise tolerance. ocass pal not much GI /GU: No adominal pain, vomiting, change in bowel habits. No blood in the stool. No significant GU symptoms. SKIN/HEME: ,no acute skin rashes suspicious lesions or bleeding. No lymphadenopathy, nodules, masses.  NEURO/ PSYCH:  No neurologic signs such as weakness numbness. No depression anxiety. IMM/ Allergy: No unusual infections.  Allergy .   REST of 12 system review negative except as per HPI   Past Medical History  Diagnosis Date  . Osteopenia   . Allergic rhinitis   . Clavicle fracture 3/08    left  . CHEST WALL PAIN, ACUTE 07/21/2010    Qualifier: Diagnosis of  By: Sarajane Jews MD, Ishmael Holter    . Aortic insufficiency   . CAT SCRATCH 09/10/2009    Qualifier: Diagnosis of  By: Regis Bill MD, [name]   . Vision disturbance 03/13/2013    Negative neuro MRA MRI  . Heart palpitations   . Diverticulosis   . HTN (hypertension)     Past Surgical History  Procedure Laterality Date  . Tonsillectomy and adenoidectomy    . Laparoscopic salpingo oopherectomy Right   . Appendectomy    . Abdominal hysterectomy      pt denies 04/15/14    Family History  Problem Relation Age of Onset  . Scleroderma Mother   . Hyperlipidemia Mother   . Coronary artery disease Father 69  . Esophageal cancer Paternal Grandmother     History   Social History  . Marital Status: Married    Spouse Name: N/A    Number of Children: 0  . Years of Education: N/A   Occupational History  . Newspaper Jouralist   . REPORTER    Social History Main Topics  . Smoking status: Never Smoker   . Smokeless tobacco: Never Used  . Alcohol Use: No     Comment: never  . Drug Use: No  . Sexual Activity: None   Other Topics Concern  . None   Social History Narrative   Married   hh of 2  p0g0   Works for [employer]     Outpatient Encounter Prescriptions as of 10/28/2014  Medication Sig  . aspirin EC 81 MG tablet Take 81 mg by mouth daily.  . Calcium Carbonate-Vitamin D (CALCIUM PLUS VITAMIN D PO) Take 2 tablets by mouth daily.   [name] diltiazem (CARDIZEM CD) 120 MG 24 hr capsule Take 1 capsule (120 mg total) by mouth daily.  [name] ibuprofen (ADVIL,MOTRIN) 800 MG tablet Take 800 mg by mouth 2 (two) times daily as needed for moderate pain.   . montelukast (SINGULAIR) 10 MG tablet Take 1 tablet (10 mg total) by mouth daily.  . Multiple Vitamins-Minerals (PRESERVISION AREDS PO) Take 1 tablet by mouth 2 (two) times daily.   [name] LORazepam (ATIVAN) 0.5 MG tablet Take 1 tablet (0.5 mg total) by mouth 3 (three) times daily as needed for anxiety.  . [DISCONTINUED] LORazepam (ATIVAN) 0.5 MG tablet Take 0.5 mg by mouth 3  (three) times daily as needed for anxiety.     EXAM:  BP 120/66 mmHg  Temp(Src) 97.9 F (36.6 C) (Oral)  Ht 5' 4.75" (1.645 m)  Wt 111 lb 8 oz (50.576 kg)  BMI 18.69 kg/m2  Body mass index is 18.69 kg/(m^2).  Physical Exam: Vital signs reviewed ZLD:[id number] is a well-developed well-nourished alert cooperative    who appearsr stated age in no acute distress.  Some throat clearing HEENT: normocephalic atraumatic , Eyes: PERRL EOM's full, conjunctiva clear, Nares: paten,t no deformity discharge or tenderness., Ears: no deformity EAC's clear TMs with normal landmarks. Mouth: clear OP, no lesions, edema.  Moist mucous membranes. Dentition in adequate repair. NECK: supple without masses, thyromegaly or bruits. CHEST/PULM:  Clear to auscultation and percussion breath sounds equal no wheeze , rales or rhonchi. No chest wall deformities or tenderness. CV: PMI is nondisplaced, S1 S2 no gallops, 1-2/6 blowing decrescendo  Murmur blowing   murmurs, norubs. Peripheral pulses are full without delay.No JVD .  Breast: normal by inspection . No dimpling, discharge, masses, tenderness or discharge . ABDOMEN: Bowel sounds normal nontender  No guard or rebound, no hepato splenomegal no CVA tenderness.  No hernia. Extremtities:  No clubbing cyanosis or edema, no acute joint swelling or redness no focal atrophy NEURO:  Oriented x3, cranial nerves 3-12 appear to be intact, no obvious focal weakness,gait within normal limits no abnormal reflexes or asymmetrical SKIN: No acute rashes normal turgor, color, no bruising or petechiae. PSYCH: Oriented, good eye contact, no obvious depression anxiety, cognition and judgment appear normal. LN: no cervical axillary inguinal adenopathy  Lab Results  Component Value Date   WBC 6.1 10/15/2014   HGB 13.7 10/15/2014   HCT 42.0 10/15/2014   PLT 242.0 10/15/2014   GLUCOSE 91 10/15/2014   CHOL 209* 10/15/2014   TRIG 61.0 10/15/2014   HDL 72.50 10/15/2014   LDLDIRECT 131.0  09/03/2009   LDLCALC 124* 10/15/2014   ALT 16 10/15/2014   AST 23 10/15/2014   NA 141 10/15/2014   K 4.9 10/15/2014   CL 107 10/15/2014   CREATININE 0.7 10/15/2014   BUN 20 10/15/2014   CO2 26 10/15/2014   TSH 1.04 10/15/2014   BP Readings from Last 3 Encounters:  10/28/14 120/66  10/05/14 116/60  09/03/14 126/68    ASSESSMENT AND PLAN:  Discussed the following assessment and plan:  Visit for preventive health examination  Aortic insufficiency - stable   Allergic rhinitis, unspecified allergic rhinitis type - cough when lays down for time  Osteopenia - hx  of med use  last dexa 2012 repeat net year  Refill x 1 loraz for travel anxiety Patient Care Team: Burnis Medin, MD as PCP - General Clent Jacks, MD (Ophthalmology) V Hiram Comber, MD (Orthopedic Surgery) Minus Breeding, MD as Consulting Physician (Cardiology) Inda Castle, MD as Consulting Physician (Gastroenterology) Patient Instructions    Continue lifestyle intervention healthy eating and exercise . Healthy lifestyle includes : At least 150 minutes of exercise weeks  , weight at healthy levels, which is usually   BMI 19-25. Avoid trans fats and processed foods;  Increase fresh fruits and veges to 5 servings per day. And avoid sweet beverages including tea and juice. Mediterranean diet with olive oil and nuts have been noted to be heart and brain healthy . Avoid tobacco products . Limit  alcohol to  7 per week for women and 14 servings for men.  Get adequate sleep . Wear seat belts . Don't text and drive .   Bone density  in 2016 call us  E mail [place]  for order .      Why follow it? Research shows. . Those who follow the Mediterranean diet have a reduced risk of heart disease  . The diet is associated with a reduced incidence of Parkinson's and Alzheimer's diseases . People following the diet may have longer life expectancies and lower rates of chronic diseases  . The Dietary Guidelines for Americans  recommends the Mediterranean diet as an eating plan to promote health and prevent disease  What Is the Mediterranean Diet?  . Healthy eating plan based on typical foods and recipes of Mediterranean-style cooking . The diet is primarily a plant based diet; these foods should make up a majority of meals   Starches - Plant based foods should make up a majority of meals - They are an important sources of vitamins, minerals, energy, antioxidants, and fiber - Choose whole grains, foods high in fiber and minimally processed items  - Typical grain sources include wheat, oats, barley, corn, brown rice, bulgar, farro, millet, polenta, couscous  - Various types of beans include chickpeas, lentils, fava beans, black beans, white beans   Fruits  Veggies - Large quantities of antioxidant rich fruits & veggies; 6 or more servings  - Vegetables can be eaten raw or lightly drizzled with oil and cooked  - Vegetables common to the traditional Mediterranean Diet include: artichokes, arugula, beets, broccoli, brussel sprouts, cabbage, carrots, celery, collard greens, cucumbers, eggplant, kale, leeks, lemons, lettuce, mushrooms, okra, onions, peas, peppers, potatoes, pumpkin, radishes, rutabaga, shallots, spinach, sweet potatoes, turnips, zucchini - Fruits common to the Mediterranean Diet include: apples, apricots, avocados, cherries, clementines, dates, figs, grapefruits, grapes, melons, nectarines, oranges, peaches, pears, pomegranates, strawberries, tangerines  Fats - Replace butter and margarine with healthy oils, such as olive oil, canola oil, and tahini  - Limit nuts to no more than a handful a day  - Nuts include walnuts, almonds, pecans, pistachios, pine nuts  - Limit or avoid candied, honey roasted or heavily salted nuts - Olives are central to the [employer] - can be eaten whole or used in a variety of dishes   Meats Protein - Limiting red meat: no more than a few times a month - When eating red  meat: choose lean cuts and keep the portion to the size of deck of cards - Eggs: approx. 0 to 4 times a week  - Fish and lean poultry: at least 2 a week  - Healthy protein  sources include, chicken, [place], lean beef, lamb - Increase intake of seafood such as tuna, salmon, trout, mackerel, shrimp, scallops - Avoid or limit high fat processed meats such as sausage and bacon  Dairy - Include moderate amounts of low fat dairy products  - Focus on healthy dairy such as fat free yogurt, skim milk, low or reduced fat cheese - Limit dairy products higher in fat such as whole or 2% milk, cheese, ice cream  Alcohol - Moderate amounts of red wine is ok  - No more than 5 oz daily for women (all ages) and men older than age 31  - No more than 10 oz of wine daily for men younger than 32  Other - Limit sweets and other desserts  - Use herbs and spices instead of salt to flavor foods  - Herbs and spices common to the traditional Mediterranean Diet include: basil, bay leaves, chives, cloves, cumin, fennel, garlic, lavender, marjoram, mint, oregano, parsley, pepper, rosemary, sage, savory, sumac, tarragon, thyme   It's not just a diet, it's a lifestyle:  . The Mediterranean diet includes lifestyle factors typical of those in the region  . Foods, drinks and meals are best eaten with others and savored . Daily physical activity is important for overall good health . This could be strenuous exercise like running and aerobics . This could also be more leisurely activities such as walking, housework, yard-work, or taking the stairs . Moderation is the key; a balanced and healthy diet accommodates most foods and drinks . Consider portion sizes and frequency of consumption of certain foods   Meal Ideas & Options:  . Breakfast:  o Whole wheat toast or whole wheat English muffins with peanut butter & hard boiled egg o Steel cut oats topped with apples & cinnamon and skim milk  o Fresh fruit: banana, strawberries,  melon, berries, peaches  o Smoothies: strawberries, bananas, greek yogurt, peanut butter o Low fat greek yogurt with blueberries and granola  o Egg white omelet with spinach and mushrooms o Breakfast couscous: whole wheat couscous, apricots, skim milk, cranberries  . Sandwiches:  o Hummus and grilled vegetables (peppers, zucchini, squash) on whole wheat bread   o Grilled chicken on whole wheat pita with lettuce, tomatoes, cucumbers or tzatziki  o Tuna salad on whole wheat bread: tuna salad made with greek yogurt, olives, red peppers, capers, green onions o Garlic rosemary lamb pita: lamb sauted with garlic, rosemary, salt & pepper; add lettuce, cucumber, greek yogurt to pita - flavor with lemon juice and black pepper  . Seafood:  o Mediterranean grilled salmon, seasoned with garlic, basil, parsley, lemon juice and black pepper o Shrimp, lemon, and spinach whole-grain pasta salad made with low fat greek yogurt  o Seared scallops with lemon orzo  o Seared tuna steaks seasoned salt, pepper, coriander topped with tomato mixture of olives, tomatoes, olive oil, minced garlic, parsley, green onions and cappers  . Meats:  o Herbed greek chicken salad with kalamata olives, cucumber, feta  o Red bell peppers stuffed with spinach, bulgur, lean ground beef (or lentils) & topped with feta   o Kebabs: skewers of chicken, tomatoes, onions, zucchini, squash  o [place] burgers: made with red onions, mint, dill, lemon juice, feta cheese topped with roasted red peppers . Vegetarian o Cucumber salad: cucumbers, artichoke hearts, celery, red onion, feta cheese, tossed in olive oil & lemon juice  o Hummus and whole grain pita points with a greek salad (lettuce, tomato, feta, olives, cucumbers,  red onion) o Lentil soup with celery, carrots made with vegetable broth, garlic, salt and pepper  o Tabouli salad: parsley, bulgur, mint, scallions, cucumbers, tomato, radishes, lemon juice, olive oil, salt and  pepper.        Try flonase nasacort  Every day for 2 weeks to see if cough drainage gets better     [employer]. Narissa Beaufort M.D.

## 2014-10-28 NOTE — Patient Instructions (Addendum)
Continue lifestyle intervention healthy eating and exercise . Healthy lifestyle includes : At least 150 minutes of exercise weeks  , weight at healthy levels, which is usually   BMI 19-25. Avoid trans fats and processed foods;  Increase fresh fruits and veges to 5 servings per day. And avoid sweet beverages including tea and juice. Mediterranean diet with olive oil and nuts have been noted to be heart and brain healthy . Avoid tobacco products . Limit  alcohol to  7 per week for women and 14 servings for men.  Get adequate sleep . Wear seat belts . Don't text and drive .   Bone density  in 2016 call us  E mail [place]  for order .      Why follow it? Research shows. . Those who follow the Mediterranean diet have a reduced risk of heart disease  . The diet is associated with a reduced incidence of Parkinson's and Alzheimer's diseases . People following the diet may have longer life expectancies and lower rates of chronic diseases  . The Dietary Guidelines for Americans recommends the Mediterranean diet as an eating plan to promote health and prevent disease  What Is the Mediterranean Diet?  . Healthy eating plan based on typical foods and recipes of Mediterranean-style cooking . The diet is primarily a plant based diet; these foods should make up a majority of meals   Starches - Plant based foods should make up a majority of meals - They are an important sources of vitamins, minerals, energy, antioxidants, and fiber - Choose whole grains, foods high in fiber and minimally processed items  - Typical grain sources include wheat, oats, barley, corn, brown rice, bulgar, farro, millet, polenta, couscous  - Various types of beans include chickpeas, lentils, fava beans, black beans, white beans   Fruits  Veggies - Large quantities of antioxidant rich fruits & veggies; 6 or more servings  - Vegetables can be eaten raw or lightly drizzled with oil and cooked  - Vegetables common to the traditional  Mediterranean Diet include: artichokes, arugula, beets, broccoli, brussel sprouts, cabbage, carrots, celery, collard greens, cucumbers, eggplant, kale, leeks, lemons, lettuce, mushrooms, okra, onions, peas, peppers, potatoes, pumpkin, radishes, rutabaga, shallots, spinach, sweet potatoes, turnips, zucchini - Fruits common to the Mediterranean Diet include: apples, apricots, avocados, cherries, clementines, dates, figs, grapefruits, grapes, melons, nectarines, oranges, peaches, pears, pomegranates, strawberries, tangerines  Fats - Replace butter and margarine with healthy oils, such as olive oil, canola oil, and tahini  - Limit nuts to no more than a handful a day  - Nuts include walnuts, almonds, pecans, pistachios, pine nuts  - Limit or avoid candied, honey roasted or heavily salted nuts - Olives are central to the [employer] - can be eaten whole or used in a variety of dishes   Meats Protein - Limiting red meat: no more than a few times a month - When eating red meat: choose lean cuts and keep the portion to the size of deck of cards - Eggs: approx. 0 to 4 times a week  - Fish and lean poultry: at least 2 a week  - Healthy protein sources include, chicken, [place], lean beef, lamb - Increase intake of seafood such as tuna, salmon, trout, mackerel, shrimp, scallops - Avoid or limit high fat processed meats such as sausage and bacon  Dairy - Include moderate amounts of low fat dairy products  - Focus on healthy dairy such as fat free yogurt, skim milk, low or reduced  fat cheese - Limit dairy products higher in fat such as whole or 2% milk, cheese, ice cream  Alcohol - Moderate amounts of red wine is ok  - No more than 5 oz daily for women (all ages) and men older than age 62  - No more than 10 oz of wine daily for men younger than 46  Other - Limit sweets and other desserts  - Use herbs and spices instead of salt to flavor foods  - Herbs and spices common to the traditional Mediterranean  Diet include: basil, bay leaves, chives, cloves, cumin, fennel, garlic, lavender, marjoram, mint, oregano, parsley, pepper, rosemary, sage, savory, sumac, tarragon, thyme   It's not just a diet, it's a lifestyle:  . The Mediterranean diet includes lifestyle factors typical of those in the region  . Foods, drinks and meals are best eaten with others and savored . Daily physical activity is important for overall good health . This could be strenuous exercise like running and aerobics . This could also be more leisurely activities such as walking, housework, yard-work, or taking the stairs . Moderation is the key; a balanced and healthy diet accommodates most foods and drinks . Consider portion sizes and frequency of consumption of certain foods   Meal Ideas & Options:  . Breakfast:  o Whole wheat toast or whole wheat English muffins with peanut butter & hard boiled egg o Steel cut oats topped with apples & cinnamon and skim milk  o Fresh fruit: banana, strawberries, melon, berries, peaches  o Smoothies: strawberries, bananas, greek yogurt, peanut butter o Low fat greek yogurt with blueberries and granola  o Egg white omelet with spinach and mushrooms o Breakfast couscous: whole wheat couscous, apricots, skim milk, cranberries  . Sandwiches:  o Hummus and grilled vegetables (peppers, zucchini, squash) on whole wheat bread   o Grilled chicken on whole wheat pita with lettuce, tomatoes, cucumbers or tzatziki  o Tuna salad on whole wheat bread: tuna salad made with greek yogurt, olives, red peppers, capers, green onions o Garlic rosemary lamb pita: lamb sauted with garlic, rosemary, salt & pepper; add lettuce, cucumber, greek yogurt to pita - flavor with lemon juice and black pepper  . Seafood:  o Mediterranean grilled salmon, seasoned with garlic, basil, parsley, lemon juice and black pepper o Shrimp, lemon, and spinach whole-grain pasta salad made with low fat greek yogurt  o Seared scallops  with lemon orzo  o Seared tuna steaks seasoned salt, pepper, coriander topped with tomato mixture of olives, tomatoes, olive oil, minced garlic, parsley, green onions and cappers  . Meats:  o Herbed greek chicken salad with kalamata olives, cucumber, feta  o Red bell peppers stuffed with spinach, bulgur, lean ground beef (or lentils) & topped with feta   o Kebabs: skewers of chicken, tomatoes, onions, zucchini, squash  o [place] burgers: made with red onions, mint, dill, lemon juice, feta cheese topped with roasted red peppers . Vegetarian o Cucumber salad: cucumbers, artichoke hearts, celery, red onion, feta cheese, tossed in olive oil & lemon juice  o Hummus and whole grain pita points with a greek salad (lettuce, tomato, feta, olives, cucumbers, red onion) o Lentil soup with celery, carrots made with vegetable broth, garlic, salt and pepper  o Tabouli salad: parsley, bulgur, mint, scallions, cucumbers, tomato, radishes, lemon juice, olive oil, salt and pepper.        Try flonase nasacort  Every day for 2 weeks to see if cough drainage gets better

## 2015-01-26 ENCOUNTER — Other Ambulatory Visit: Payer: Self-pay | Admitting: Gynecology

## 2015-01-27 LAB — CYTOLOGY - PAP

## 2015-02-23 ENCOUNTER — Ambulatory Visit: Payer: Self-pay | Admitting: Internal Medicine

## 2015-03-04 ENCOUNTER — Ambulatory Visit (INDEPENDENT_AMBULATORY_CARE_PROVIDER_SITE_OTHER): Payer: BLUE CROSS/BLUE SHIELD | Admitting: Internal Medicine

## 2015-03-04 ENCOUNTER — Encounter: Payer: Self-pay | Admitting: Internal Medicine

## 2015-03-04 VITALS — BP 98/62 | HR 74 | Ht 65.5 in | Wt 112.8 lb

## 2015-03-04 DIAGNOSIS — J301 Allergic rhinitis due to pollen: Secondary | ICD-10-CM | POA: Diagnosis not present

## 2015-03-04 DIAGNOSIS — J302 Other seasonal allergic rhinitis: Secondary | ICD-10-CM

## 2015-03-04 DIAGNOSIS — J309 Allergic rhinitis, unspecified: Secondary | ICD-10-CM

## 2015-03-04 DIAGNOSIS — J3089 Other allergic rhinitis: Principal | ICD-10-CM

## 2015-03-04 MED ORDER — MONTELUKAST SODIUM 10 MG PO TABS
10.0000 mg | ORAL_TABLET | Freq: Every day | ORAL | Status: DC
Start: 2015-03-04 — End: 2016-04-30

## 2015-03-04 NOTE — Patient Instructions (Addendum)
Ok to take an antihistamine like Claritin/ loratadine or Allegra/ fexofenadine as needed to control post nasal drip. If this doesn't seem to help let us know.  Script refilling singulair sent

## 2015-03-04 NOTE — Progress Notes (Signed)
Patient ID: [name] [name], female    DOB: 05/09/1951, 64 y.o.   MRN: [id number]  HPI 70 yoF followed for allergic rhinitis. Last here February 11, 2010 and reports no major health changes. Being outdoors more recently she has noted some nasal congestion. Bothered more by watery drainage. We discussed available decongestants and antihistamines. singulair has worked better for her than antihistamines and we explained the difference again.  02/09/12- 42 yoF never smoker followed for allergic rhinitis. She had a prolonged bronchitis episode this winter but is better now. Evaluated for palpitation with echocardiogram and diagnosed aortic insufficiency, treated with diltiazem. Dr Percival Spanish. Control is comfortable with no problems this spring. Singulair helps and is sufficient without an antihistamine.  02/13/13- 44 yoF never smoker followed for allergic rhinitis. FOLLOWS FOR: Allergies are doing well. Might have had a mold exposure. Singulair is helping to manage her symptoms. She got into mold exposure while cleaning a house last summer. Made her feel congested and tired. Doing well now.  02/19/14- 62 yoF never smoker followed for allergic rhinitis. 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 And she has done well with a few colds over the last winter. Uses nasal saline, Singulair and Flonase appropriately when needed.  03/04/15- 62 yoF never smoker followed for allergic rhinitis. 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 She feels this as a sensation in her throat and interpreted as postnasal drainage. Not much nose blowing or sneezing and no headache. We'll explore possibility she was feeling effects of reflux when supine.  ROS-see HPI Constitutional:   No-   weight loss, night sweats, fevers, chills, fatigue, lassitude. HEENT:   No-  headaches, difficulty swallowing, tooth/dental  problems, sore throat,       No-  sneezing, itching, ear ache, nasal congestion, +post nasal drip,  CV:  No-   chest pain, orthopnea, PND, swelling in lower extremities, anasarca, dizziness, palpitations Resp: No-   shortness of breath with exertion or at rest.              No-   productive cough,  No non-productive cough,  No- coughing up of blood.              No-   change in color of mucus.  No- wheezing.   Skin: No-   rash or lesions. GI:  No-   heartburn, indigestion, abdominal pain, nausea, vomiting,  GU:  MS:  No-   joint pain or swelling.   Neuro-     nothing unusual Psych:  No- change in mood or affect. No depression or anxiety.  No memory loss.   OBJ- Physical Exam General- Alert, Oriented, Affect-appropriate, Distress- none acute .  Trim. Skin- rash-none, lesions- none, excoriation- none Lymphadenopathy- none Head- atraumatic            Eyes- Gross vision intact, PERRLA, conjunctivae and secretions clear            Ears- Hearing, canals-normal            Nose- Clear, pale, no-Septal dev, mucus, polyps, erosion, perforation             Throat- Mallampati II , mucosa clear , drainage- none, tonsils- atrophic Neck- flexible , trachea midline, no stridor , thyroid nl, carotid no bruit Chest - symmetrical excursion , unlabored  Heart/CV- RRR , trace diastolic murmur L sternum , no gallop  , no rub, nl s1 s2                           - JVD- none , edema- none, stasis changes- none, varices- none           Lung- clear to P&A, wheeze- none, cough- none , dullness-none, rub- none           Chest wall-  Abd- Br/ Gen/ Rectal- Not done, not indicated Extrem- cyanosis- none, clubbing, none, atrophy- none, strength- nl Neuro- grossly intact to observation

## 2015-03-28 NOTE — Assessment & Plan Note (Addendum)
There is at least some allergic component and we discussed management with nasal steroid sprays and antihistamines. Environmental controls. Watch for symptoms that would suggest the problem is something in addition to allergic rhinitis. Consider vasomotor rhinitis or reflux.

## 2015-06-24 LAB — HM MAMMOGRAPHY

## 2015-06-28 ENCOUNTER — Encounter: Payer: Self-pay | Admitting: Family Medicine

## 2015-07-14 ENCOUNTER — Ambulatory Visit: Payer: Self-pay | Admitting: Cardiology

## 2015-07-14 ENCOUNTER — Ambulatory Visit (INDEPENDENT_AMBULATORY_CARE_PROVIDER_SITE_OTHER): Payer: BLUE CROSS/BLUE SHIELD | Admitting: Cardiology

## 2015-07-14 ENCOUNTER — Encounter: Payer: Self-pay | Admitting: Cardiology

## 2015-07-14 VITALS — BP 138/72 | HR 75 | Ht 65.5 in | Wt 109.7 lb

## 2015-07-14 DIAGNOSIS — I351 Nonrheumatic aortic (valve) insufficiency: Secondary | ICD-10-CM | POA: Diagnosis not present

## 2015-07-14 DIAGNOSIS — R002 Palpitations: Secondary | ICD-10-CM | POA: Diagnosis not present

## 2015-07-14 MED ORDER — PROPRANOLOL HCL 10 MG PO TABS: 10.0000 mg | ORAL_TABLET | ORAL | Status: DC | PRN

## 2015-07-14 NOTE — Progress Notes (Signed)
HPI The patient presents for follow up of palpitations and aortic insufficiency. Since I last saw her she started having more palpitations in the summer. She describes skipped beats with episodes lasting sometimes 6 hours. There is often a trigger such as anxiety or excessive heat.  She recorded one episode where her heart rate was in the 120s.  She has no chest discomfort, neck or arm discomfort. There has been no new shortness of breath, PND or orthopnea. There has been no presyncope or syncope.  .  She does exercise.    Allergies  Allergen Reactions  . Codeine Phosphate     drowsy  . Demerol [Meperidine]   . Meperidine Hcl Nausea Only    Current Outpatient Prescriptions  Medication Sig Dispense Refill  . aspirin EC 81 MG tablet Take 81 mg by mouth daily.    . Calcium Carbonate-Vitamin D (CALCIUM PLUS VITAMIN D PO) Take 2 tablets by mouth daily.     [name] diltiazem (CARDIZEM CD) 120 MG 24 hr capsule Take 1 capsule (120 mg total) by mouth daily. 90 capsule 3  . Eyelid Cleansers (AVENOVA) 0.01 % SOLN Apply topically daily. Pt wipes the eyelid once daily    . ibuprofen (ADVIL,MOTRIN) 800 MG tablet Take 800 mg by mouth 2 (two) times daily as needed for moderate pain.     [name] LORazepam (ATIVAN) 0.5 MG tablet Take 1 tablet (0.5 mg total) by mouth 3 (three) times daily as needed for anxiety. 20 tablet 0  . montelukast (SINGULAIR) 10 MG tablet Take 1 tablet (10 mg total) by mouth daily. 90 tablet 3  . Multiple Vitamins-Minerals (PRESERVISION AREDS PO) Take 1 tablet by mouth 2 (two) times daily.      No current facility-administered medications for this visit.    Past Medical History  Diagnosis Date  . Osteopenia   . Allergic rhinitis   . Clavicle fracture 3/08    left  . CHEST WALL PAIN, ACUTE 07/21/2010    Qualifier: Diagnosis of  By: Sarajane Jews MD, Ishmael Holter   . Aortic insufficiency   . CAT SCRATCH 09/10/2009    Qualifier: Diagnosis of  By: Regis Bill MD, [name]   . Vision disturbance 03/13/2013    Negative neuro MRA MRI  . Heart palpitations   . Diverticulosis   . HTN (hypertension)     Past Surgical History  Procedure Laterality Date  . Tonsillectomy and adenoidectomy    . Laparoscopic salpingo oopherectomy Right   . Appendectomy    . Abdominal hysterectomy      pt denies 04/15/14    ROS:  Positive for palpitations, seasonal allergies, varicose veins. Otherwise as stated in the HPI and negative for all other systems.  PHYSICAL EXAM BP 138/72 mmHg  Pulse 75  Ht 5' 5.5" (1.664 m)  Wt 109 lb 11.2 oz (49.76 kg)  BMI 17.97 kg/m2 GENERAL:  Well appearing NECK:  No jugular venous distention, waveform within normal limits, carotid upstroke brisk and symmetric, no bruits, no thyromegaly LUNGS:  Clear to auscultation bilaterally CHEST:  Unremarkable HEART:  PMI not displaced or sustained,S1 and S2 within normal limits, no S3, no S4, no clicks, no rubs, diastolic murmur mid to late at the left 3rd intercostal space. ABD:  Flat, positive bowel sounds normal in frequency in pitch, no bruits, no rebound, no guarding, no midline pulsatile mass, no hepatomegaly, no splenomegaly EXT:  2 plus pulses throughout, no edema, no cyanosis no clubbing  EKG:  Sinus rhythm, rate 75, axis  within normal limits, intervals within normal limits, no acute ST-T wave changes.  07/14/2015  ASSESSMENT AND PLAN  Aortic insufficiency -   No change in therapy at this point.  Her exam is unchanged and I don't think echocardiography is indicated.  Palpitations -  There are increased.  I will treat with propranolol when necessary. I will apply an event monitor. Alvis D Sienkiewicz will need a 21 day event monitor.  The patients symptoms necessitate an event monitor.  The symptoms are too infrequent to be identified on a Holter monitor.    ELEVATED BLOOD PRESSURE WITHOUT DIAGNOSIS OF HYPERTENSION -  The blood pressure is at target. No change in medications is indicated. We will continue with therapeutic lifestyle  changes (TLC).

## 2015-07-14 NOTE — Patient Instructions (Signed)
Your physician wants you to follow-up in: 6 Months. You will receive a reminder letter in the mail two months in advance. If you don't receive a letter, please call our office to schedule the follow-up appointment.  Your physician has recommended that you wear an event monitor. Event monitors are medical devices that record the heart's electrical activity. Doctors most often [place] these monitors to diagnose arrhythmias. Arrhythmias are problems with the speed or rhythm of the heartbeat. The monitor is a small, portable device. You can wear one while you do your normal daily activities. This is usually used to diagnose what is causing palpitations/syncope (passing out).  Your physician has recommended you make the following change in your medication: START Propranolol 10 mg every 4 hours as needed

## 2015-07-29 ENCOUNTER — Telehealth: Payer: Self-pay | Admitting: Cardiology

## 2015-07-29 DIAGNOSIS — I351 Nonrheumatic aortic (valve) insufficiency: Secondary | ICD-10-CM

## 2015-07-29 MED ORDER — DILTIAZEM HCL ER COATED BEADS 120 MG PO CP24: 240.0000 mg | ORAL_CAPSULE | Freq: Every day | ORAL | Status: DC

## 2015-07-29 NOTE — Telephone Encounter (Signed)
Returning your call. °

## 2015-07-29 NOTE — Telephone Encounter (Addendum)
New onset a fib 1222 pm  Afib 188  Patient initiated event 172 with symptoms of heart racing  Per [facility].  Faxing [facility]  Patient said that she had just eaten lunch and started vacuming.  Had singulair, and atrodavastin 30 minutes before episode  Just took Inderal 15 minutes ago  Still feels heart rate racing.  Heart rate regular.   Taking Heart Rate for me now  Bp 135/100.Marland KitchenMarland KitchenMarland KitchenP 140 BP 167/104  P 129 BP 141/104  P 150  No shortness of breath, chest pain or dizziness  Sittiing right now

## 2015-07-29 NOTE — Telephone Encounter (Signed)
Change of Cardizem medication in [facility] to 240 mg  Would like RX for 120 mg taking two at one time

## 2015-07-29 NOTE — Telephone Encounter (Signed)
Agree 

## 2015-07-29 NOTE — Telephone Encounter (Signed)
Per Dr. Sallyanne Kuster  Have patient take an additional Cardizem 120 mg now.  Increase Cardizem to 240 mg a day  Rest for the remainder of the day  Also instructed patient that if she had symptoms such as being too dizzy to stand up, very short of breath, chest pain or heart rate feeling faster that she needs to seek immediate medical care

## 2015-07-30 ENCOUNTER — Telehealth: Payer: Self-pay

## 2015-07-30 NOTE — Telephone Encounter (Signed)
FU with patient  Feeling better  Took 2nd 120 mg dose of Cardizem yesterday around 2pm  Had rapid rate between 12n and 530pm and said it was of a much shorter duration than she has had in the past  Today took two tablets of 120 mg and have not had incident today

## 2015-08-04 ENCOUNTER — Telehealth: Payer: Self-pay | Admitting: Cardiology

## 2015-08-04 NOTE — Telephone Encounter (Signed)
Pt have been wearing a monitor for 3 weeks,today makes 3 weeks.Pt wants to know what she should do?

## 2015-08-04 NOTE — Telephone Encounter (Signed)
Spoke with pt, questions answered.

## 2015-08-08 ENCOUNTER — Encounter: Payer: Self-pay | Admitting: Internal Medicine

## 2015-08-09 ENCOUNTER — Telehealth: Payer: Self-pay | Admitting: *Deleted

## 2015-08-09 NOTE — Telephone Encounter (Signed)
-----   Message from Minus Breeding, MD sent at 08/08/2015  7:46 PM EDT ----- Event monitor with PAF.  Cardizem has increased.  I will ask our staff to schedule follow up with me to discuss.

## 2015-08-09 NOTE — Telephone Encounter (Signed)
Spoke with pt, Follow up scheduled  

## 2015-08-09 NOTE — Telephone Encounter (Signed)
Pt is calling back about her monitor results . Please f/u with her   Thanks

## 2015-08-09 NOTE — Telephone Encounter (Signed)
Left message for pt to call.

## 2015-08-21 ENCOUNTER — Telehealth: Payer: Self-pay | Admitting: Cardiology

## 2015-08-21 NOTE — Telephone Encounter (Signed)
Pt called with complaints of tachycardia- HR 120. She asked if she should take Propanolol 10 mg as previously prescribed and I encouraged her to do so and told her she could repeat that dose x 1 in 4 hrs if her HR was still > 120.  Kerin Ransom PA-C 08/21/2015 3:46 PM

## 2015-08-25 ENCOUNTER — Ambulatory Visit (INDEPENDENT_AMBULATORY_CARE_PROVIDER_SITE_OTHER): Payer: BLUE CROSS/BLUE SHIELD | Admitting: Cardiology

## 2015-08-25 ENCOUNTER — Encounter: Payer: Self-pay | Admitting: Cardiology

## 2015-08-25 VITALS — BP 138/82 | HR 86 | Ht 65.0 in | Wt 110.2 lb

## 2015-08-25 DIAGNOSIS — I351 Nonrheumatic aortic (valve) insufficiency: Secondary | ICD-10-CM | POA: Diagnosis not present

## 2015-08-25 DIAGNOSIS — R5383 Other fatigue: Secondary | ICD-10-CM

## 2015-08-25 MED ORDER — APIXABAN 5 MG PO TABS: 5.0000 mg | ORAL_TABLET | Freq: Two times a day (BID) | ORAL | Status: DC

## 2015-08-25 MED ORDER — DILTIAZEM HCL ER COATED BEADS 300 MG PO CP24: 300.0000 mg | ORAL_CAPSULE | Freq: Every day | ORAL | Status: DC

## 2015-08-25 NOTE — Patient Instructions (Signed)
Your physician recommends that you schedule a follow-up appointment in: [place] has recommended you make the following change in your medication: STOP Aspirin and START Eliqius 5 mg twice a day, INCREASE Cardizem 300 mg daily  Your physician recommends that you return for lab work in: Today TSH  If you need a refill on your cardiac medications before your next appointment, please call your pharmacy.

## 2015-08-25 NOTE — Progress Notes (Signed)
HPI The patient presents for follow up of palpitations and aortic insufficiency. She wore an event monitor which demonstrated paroxysmal atrial fibrillation. She's been feeling these relatively frequently. She did have her dose of Cardizem increased. She is distressed when she gets these. She's had no new chest pressure, neck or arm discomfort. She's not having any new shortness of breath, PND or orthopnea. She's had no weakness or edema.   Allergies  Allergen Reactions  . Codeine Phosphate     drowsy  . Demerol [Meperidine]   . Meperidine Hcl Nausea Only    Current Outpatient Prescriptions  Medication Sig Dispense Refill  . aspirin EC 81 MG tablet Take 81 mg by mouth daily.    . Calcium Carbonate-Vitamin D (CALCIUM PLUS VITAMIN D PO) Take 2 tablets by mouth daily.     [name] diltiazem (CARDIZEM CD) 120 MG 24 hr capsule Take 2 capsules (240 mg total) by mouth daily. 180 capsule 3  . Eyelid Cleansers (AVENOVA) 0.01 % SOLN Apply topically daily. Pt wipes the eyelid once daily    . ibuprofen (ADVIL,MOTRIN) 800 MG tablet Take 800 mg by mouth 2 (two) times daily as needed for moderate pain.     [name] LORazepam (ATIVAN) 0.5 MG tablet Take 1 tablet (0.5 mg total) by mouth 3 (three) times daily as needed for anxiety. 20 tablet 0  . montelukast (SINGULAIR) 10 MG tablet Take 1 tablet (10 mg total) by mouth daily. 90 tablet 3  . Multiple Vitamins-Minerals (PRESERVISION AREDS PO) Take 1 tablet by mouth 2 (two) times daily.     . propranolol (INDERAL) 10 MG tablet Take 1 tablet (10 mg total) by mouth every 4 (four) hours as needed. 120 tablet 6   No current facility-administered medications for this visit.    Past Medical History  Diagnosis Date  . Osteopenia   . Allergic rhinitis   . Clavicle fracture 3/08    left  . CHEST WALL PAIN, ACUTE 07/21/2010    Qualifier: Diagnosis of  By: Sarajane Jews MD, Ishmael Holter   . Aortic insufficiency   . CAT SCRATCH 09/10/2009    Qualifier: Diagnosis of  By: Regis Bill MD, [name]   . Vision disturbance 03/13/2013    Negative neuro MRA MRI  . Heart palpitations   . Diverticulosis   . HTN (hypertension)     Past Surgical History  Procedure Laterality Date  . Tonsillectomy and adenoidectomy    . Laparoscopic salpingo oopherectomy Right   . Appendectomy    . Abdominal hysterectomy      pt denies 04/15/14    ROS:  Positive for palpitations, seasonal allergies, varicose veins. Otherwise as stated in the HPI and negative for all other systems.  PHYSICAL EXAM BP 138/82 mmHg  Pulse 86  Ht 5\' 5"  (1.651 m)  Wt 110 lb 3.2 oz (49.986 kg)  BMI 18.34 kg/m2 GENERAL:  Well appearing NECK:  No jugular venous distention, waveform within normal limits, carotid upstroke brisk and symmetric, no bruits, no thyromegaly LUNGS:  Clear to auscultation bilaterally CHEST:  Unremarkable HEART:  PMI not displaced or sustained,S1 and S2 within normal limits, no S3, no S4, no clicks, no rubs, diastolic murmur mid to late at the left 3rd intercostal space. ABD:  Flat, positive bowel sounds normal in frequency in pitch, no bruits, no rebound, no guarding, no midline pulsatile mass, no hepatomegaly, no splenomegaly EXT:  2 plus pulses throughout, no edema, no cyanosis no clubbing   ASSESSMENT AND PLAN  Aortic  insufficiency -   No change in therapy at this point.  I will be repeating an echocardiogram within the next 12 months.  Atrial fib-  Ms. Gerald D Fader has a CHA2DS2 - VASc score of 2.  We had a long discussion about risks benefits of anticoagulation. She will stop aspirin and start Eliquis.  For now she will continue with rate control and I'll increase her Cardizem although we might need to consider rhythm control.  ELEVATED BLOOD PRESSURE WITHOUT DIAGNOSIS OF HYPERTENSION -  The blood pressure is at target. No change in medications is indicated. We will continue with therapeutic lifestyle changes (TLC).   (Greater than 40 minutes reviewing all data with greater than 50% face to  face with the patient).

## 2015-08-26 LAB — TSH: TSH: 1.065 u[IU]/mL (ref 0.350–4.500)

## 2015-08-30 ENCOUNTER — Ambulatory Visit: Payer: Self-pay | Admitting: Cardiology

## 2015-08-31 ENCOUNTER — Telehealth: Payer: Self-pay | Admitting: Pharmacist Clinician (PhC)/ Clinical Pharmacy Specialist

## 2015-08-31 MED ORDER — APIXABAN 5 MG PO TABS: 5.0000 mg | ORAL_TABLET | Freq: Two times a day (BID) | ORAL | Status: DC

## 2015-08-31 NOTE — Telephone Encounter (Signed)
Pt needed Rx for 30 days supply to [facility] for free coupon use.  Also wondering if we received fax from [employer] clarifying dose of diltiazem.    LMOM for patient.  [facility], have not seen any clarification of diltiazem dose from [employer], RN aware.

## 2015-09-02 ENCOUNTER — Telehealth: Payer: Self-pay | Admitting: Cardiology

## 2015-09-02 NOTE — Telephone Encounter (Signed)
Patient states her mail order pharmacy is still waiting for a reply from the authorization they faxed over 1 week ago stating the new RX for Cardizem 300 is a replacement for Cardizem 240--not in addition to.  Please take care of this asap and let the patient know when it has been done.

## 2015-09-06 MED ORDER — DILTIAZEM HCL ER COATED BEADS 300 MG PO CP24: 300.0000 mg | ORAL_CAPSULE | Freq: Every day | ORAL | Status: DC

## 2015-09-06 NOTE — Telephone Encounter (Signed)
Resent rx with clarification of dose.  Then called mail order pharmacy and spoke with Vu - pharmacist, who promised med would be sent out today.

## 2015-09-27 ENCOUNTER — Encounter: Payer: Self-pay | Admitting: Cardiology

## 2015-09-27 ENCOUNTER — Ambulatory Visit (INDEPENDENT_AMBULATORY_CARE_PROVIDER_SITE_OTHER): Payer: BLUE CROSS/BLUE SHIELD | Admitting: Cardiology

## 2015-09-27 VITALS — BP 128/74 | HR 82 | Ht 65.0 in | Wt 112.1 lb

## 2015-09-27 DIAGNOSIS — I480 Paroxysmal atrial fibrillation: Secondary | ICD-10-CM | POA: Diagnosis not present

## 2015-09-27 NOTE — Progress Notes (Signed)
HPI The patient presents for follow up of palpitations and aortic insufficiency. She wore an event monitor which demonstrated paroxysmal atrial fibrillation. She felt these twice since I last saw her.  The episodes last for about 7 hours. She'll take the atenolol may be less year but is still quite anxious. This has been on the increased dose of Cardizem.  She's had no new chest pressure, neck or arm discomfort. She's not having any new shortness of breath, PND or orthopnea. She's had no weakness or edema.   Allergies  Allergen Reactions  . Codeine Phosphate     drowsy  . Demerol [Meperidine]   . Meperidine Hcl Nausea Only    Current Outpatient Prescriptions  Medication Sig Dispense Refill  . apixaban (ELIQUIS) 5 MG TABS tablet Take 1 tablet (5 mg total) by mouth 2 (two) times daily. 60 tablet 0  . Calcium Carbonate-Vitamin D (CALCIUM PLUS VITAMIN D PO) Take 2 tablets by mouth daily.     [name] diltiazem (CARDIZEM CD) 300 MG 24 hr capsule Take 1 capsule (300 mg total) by mouth daily. 90 capsule 3  . Eyelid Cleansers (AVENOVA) 0.01 % SOLN Apply topically daily. Pt wipes the eyelid once daily    . ibuprofen (ADVIL,MOTRIN) 800 MG tablet Take 800 mg by mouth 2 (two) times daily as needed for moderate pain.     [name] LORazepam (ATIVAN) 0.5 MG tablet Take 1 tablet (0.5 mg total) by mouth 3 (three) times daily as needed for anxiety. 20 tablet 0  . montelukast (SINGULAIR) 10 MG tablet Take 1 tablet (10 mg total) by mouth daily. 90 tablet 3  . Multiple Vitamins-Minerals (PRESERVISION AREDS PO) Take 1 tablet by mouth 2 (two) times daily.     . propranolol (INDERAL) 10 MG tablet Take 1 tablet (10 mg total) by mouth every 4 (four) hours as needed. 120 tablet 6   No current facility-administered medications for this visit.    Past Medical History  Diagnosis Date  . Osteopenia   . Allergic rhinitis   . Clavicle fracture 3/08    left  . CHEST WALL PAIN, ACUTE 07/21/2010    Qualifier: Diagnosis of  By:  Sarajane Jews MD, Ishmael Holter   . Aortic insufficiency   . CAT SCRATCH 09/10/2009    Qualifier: Diagnosis of  By: Regis Bill MD, [name]   . Vision disturbance 03/13/2013    Negative neuro MRA MRI  . Heart palpitations   . Diverticulosis   . HTN (hypertension)     Past Surgical History  Procedure Laterality Date  . Tonsillectomy and adenoidectomy    . Laparoscopic salpingo oopherectomy Right   . Appendectomy    . Abdominal hysterectomy      pt denies 04/15/14    ROS:  As stated in the HPI and negative for all other systems.  PHYSICAL EXAM There were no vitals taken for this visit. GENERAL:  Well appearing NECK:  No jugular venous distention, waveform within normal limits, carotid upstroke brisk and symmetric, no bruits, no thyromegaly LUNGS:  Clear to auscultation bilaterally CHEST:  Unremarkable HEART:  PMI not displaced or sustained,S1 and S2 within normal limits, no S3, no S4, no clicks, no rubs, diastolic murmur mid to late at the left 3rd intercostal space. ABD:  Flat, positive bowel sounds normal in frequency in pitch, no bruits, no rebound, no guarding, no midline pulsatile mass, no hepatomegaly, no splenomegaly EXT:  2 plus pulses throughout, no edema, no cyanosis no clubbing   ASSESSMENT AND  PLAN  Aortic insufficiency -  No change in therapy at this point.  I will be repeating an echocardiogram next year  Atrial fib-  Ms. British D Evavold has a CHA2DS2 - VASc score of 2.  We had a long discussion about risks benefits of anticoagulation. She will continue Eliquis.  For now she will continue with rate control and increased Cardizem although we might need to consider rhythm control with flecainide and I discussed this with her today. [name]  ELEVATED BLOOD PRESSURE WITHOUT DIAGNOSIS OF HYPERTENSION -  The blood pressure is at target. No change in medications is indicated. We will continue with therapeutic lifestyle changes (TLC).

## 2015-09-27 NOTE — Patient Instructions (Signed)
Your physician recommends that you schedule a follow-up appointment in: End of January

## 2015-11-07 ENCOUNTER — Ambulatory Visit (HOSPITAL_COMMUNITY): Payer: BLUE CROSS/BLUE SHIELD | Attending: Family Medicine

## 2015-11-07 ENCOUNTER — Emergency Department (HOSPITAL_COMMUNITY)
Admission: EM | Admit: 2015-11-07 | Discharge: 2015-11-07 | Disposition: A | Discharge status: Home / Self Care | Payer: BLUE CROSS/BLUE SHIELD | Source: Home / Self Care | Attending: Family Medicine | Admitting: Family Medicine

## 2015-11-07 ENCOUNTER — Encounter (HOSPITAL_COMMUNITY): Payer: Self-pay | Admitting: Emergency Medicine

## 2015-11-07 DIAGNOSIS — S92254A Nondisplaced fracture of navicular [scaphoid] of right foot, initial encounter for closed fracture: Secondary | ICD-10-CM | POA: Diagnosis not present

## 2015-11-07 DIAGNOSIS — M25571 Pain in right ankle and joints of right foot: Secondary | ICD-10-CM | POA: Diagnosis present

## 2015-11-07 DIAGNOSIS — S92251A Displaced fracture of navicular [scaphoid] of right foot, initial encounter for closed fracture: Secondary | ICD-10-CM

## 2015-11-07 DIAGNOSIS — W19XXXA Unspecified fall, initial encounter: Secondary | ICD-10-CM | POA: Diagnosis not present

## 2015-11-07 DIAGNOSIS — S8261XA Displaced fracture of lateral malleolus of right fibula, initial encounter for closed fracture: Secondary | ICD-10-CM | POA: Insufficient documentation

## 2015-11-07 NOTE — ED Notes (Signed)
The patient presented to the [facility] with a complaint of a possible right ankle injury secondary to a fall that occurred at 6 am today. The patient stated that she missed a step when walking. The patient stated that she has been weight baring since the fall.

## 2015-11-07 NOTE — ED Provider Notes (Addendum)
CSN: [id number]     Arrival date & time 11/07/15  1325 History   First MD Initiated Contact with Patient 11/07/15 1428     Chief Complaint  Patient presents with  . Ankle Pain  . Fall   (Consider location/radiation/quality/duration/timing/severity/associated sxs/prior Treatment) Patient is a 65 y.o. female presenting with ankle pain and fall. The history is provided by the patient. No language interpreter was used.  Ankle Pain Associated symptoms: no fever   Fall  Patient presents with complaint of R ankle pain which began around 6am when coming down stairs in home in [place]. Slipped on last step down and ended up sitting on last step. Could not bear weight after the fall.  Wheelchair to airport (flew from [place] to [place]), then came to [facility] for evaluation.  No prior injury to R ankle.  Has had injury to L ankle.   Past Medical History  Diagnosis Date  . Osteopenia   . Allergic rhinitis   . Clavicle fracture 3/08    left  . CHEST WALL PAIN, ACUTE 07/21/2010    Qualifier: Diagnosis of  By: Sarajane Jews MD, Ishmael Holter   . Aortic insufficiency   . CAT SCRATCH 09/10/2009    Qualifier: Diagnosis of  By: Regis Bill MD, [name]   . Vision disturbance 03/13/2013    Negative neuro MRA MRI  . Heart palpitations   . Diverticulosis   . HTN (hypertension)    Past Surgical History  Procedure Laterality Date  . Tonsillectomy and adenoidectomy    . Laparoscopic salpingo oopherectomy Right   . Appendectomy    . Abdominal hysterectomy      pt denies 04/15/14   Family History  Problem Relation Age of Onset  . Scleroderma Mother   . Hyperlipidemia Mother   . Coronary artery disease Father 40  . Esophageal cancer Paternal Grandmother    Social History  Substance Use Topics  . Smoking status: Never Smoker   . Smokeless tobacco: Never Used  . Alcohol Use: No     Comment: never   OB History    No data available     Review of Systems  Constitutional: Negative for fever and chills.  All  other systems reviewed and are negative.   Allergies  Codeine phosphate; Demerol; and Meperidine hcl  Home Medications   Prior to Admission medications   Medication Sig Start Date End Date Taking? Authorizing Provider  apixaban (ELIQUIS) 5 MG TABS tablet Take 1 tablet (5 mg total) by mouth 2 (two) times daily. 08/31/15   Minus Breeding, MD  Calcium Carbonate-Vitamin D (CALCIUM PLUS VITAMIN D PO) Take 2 tablets by mouth daily.     Historical Provider, MD  diltiazem (CARDIZEM CD) 300 MG 24 hr capsule Take 1 capsule (300 mg total) by mouth daily. 09/06/15   Minus Breeding, MD  Eyelid Cleansers (AVENOVA) 0.01 % SOLN Apply topically daily. Pt wipes the eyelid once daily    Historical Provider, MD  ibuprofen (ADVIL,MOTRIN) 800 MG tablet Take 800 mg by mouth 2 (two) times daily as needed for moderate pain.     Historical Provider, MD  LORazepam (ATIVAN) 0.5 MG tablet Take 1 tablet (0.5 mg total) by mouth 3 (three) times daily as needed for anxiety. 10/28/14   Burnis Medin, MD  montelukast (SINGULAIR) 10 MG tablet Take 1 tablet (10 mg total) by mouth daily. 03/04/15 03/08/17  Deneise Lever, MD  Multiple Vitamins-Minerals (PRESERVISION AREDS PO) Take 1 tablet by mouth 2 (two) times daily.  Historical Provider, MD  propranolol (INDERAL) 10 MG tablet Take 1 tablet (10 mg total) by mouth every 4 (four) hours as needed. 07/14/15   Minus Breeding, MD   Meds Ordered and Administered this Visit  Medications - No data to display  BP 112/70 mmHg  Pulse 69  Temp(Src) 98.3 F (36.8 C) (Oral)  Resp 18  SpO2 98% No data found.   Physical Exam  Constitutional: She appears well-developed and well-nourished.  Neck: Neck supple.  Musculoskeletal:  RIGHT ankle with tenderness and edema along lateral aspect.  Mild tenderness to squeeze malleoli. Palpable dp pulse in R foot. Sensation grossly intact in toes, with brisk cap refill (<2 seconds).  Bunion R foot.   Able to dorsiflex/plantarflex; pain to  attempt inversion of R foot.     ED Course  Procedures (including critical care time)  Labs Review Labs Reviewed - No data to display  Imaging Review No results found.   Visual Acuity Review  Right Eye Distance:   Left Eye Distance:   Bilateral Distance:    Right Eye Near:   Left Eye Near:    Bilateral Near:         MDM  No diagnosis found. Avulsion fracturfe, R ankle. Called Orthopedist on-call (Dr. Fredonia Highland), discussed diagnosis and management plan with him. Non-weight bearing (patient has a CAM walker from previous L ankle injury), ice/elevation, follow up with Orthopedics in the coming week.   Avoid NSAID due to apixaban for paroxysmal a-fib.     Willeen Niece, MD 11/07/15 [place], MD 11/07/15 [phone]

## 2015-11-07 NOTE — Discharge Instructions (Signed)
It was a pleasure to see you today.   The x-ray shows you have suffered an avulsion fracture of the bones in your right ankle.    No weight bearing on the right ankle. You may use the CAM walker you have at home.  Ice and elevation of the right leg for the next 24-48 hours.  Tylenol as needed for pain.   Contact information for Dr. Fredonia Highland, Orthopedics, to be seen in the coming 1-2 weeks.

## 2015-11-23 ENCOUNTER — Ambulatory Visit (INDEPENDENT_AMBULATORY_CARE_PROVIDER_SITE_OTHER): Payer: BLUE CROSS/BLUE SHIELD | Admitting: Cardiology

## 2015-11-23 ENCOUNTER — Encounter: Payer: Self-pay | Admitting: Cardiology

## 2015-11-23 VITALS — BP 122/76 | HR 80 | Ht 65.5 in | Wt 112.0 lb

## 2015-11-23 DIAGNOSIS — I351 Nonrheumatic aortic (valve) insufficiency: Secondary | ICD-10-CM

## 2015-11-23 NOTE — Patient Instructions (Signed)
Dr Percival Spanish has made no changes in your current medications or treatment plan.  Your physician has requested that you have an echocardiogram in OCTOBER 2017. Echocardiography is a painless test that uses sound waves to create images of your heart. It provides your doctor with information about the size and shape of your heart and how well your heart's chambers and valves are working. This procedure takes approximately one hour. There are no restrictions for this procedure.  Your physician recommends that you schedule a follow-up appointment in 4 months.  If you need a refill on your cardiac medications before your next appointment, please call your pharmacy.

## 2015-11-23 NOTE — Progress Notes (Signed)
HPI The patient presents for follow up of palpitations and aortic insufficiency. She wore an event monitor which demonstrated paroxysmal atrial fibrillation. Since I last saw her she said another couple of episodes. The episodes last for about 7 hours. She'll take the propranolol and this seems to help. She otherwise has been feeling relatively well. She unfortunately fell and sprained her right ankle badly. This has limited her activities. She's not describing chest pressure, neck or arm discomfort. She's not describing PND or orthopnea. She's had no weight gain or edema.    Allergies  Allergen Reactions  . Codeine Phosphate     drowsy  . Demerol [Meperidine]   . Meperidine Hcl Nausea Only    Current Outpatient Prescriptions  Medication Sig Dispense Refill  . acetaminophen (TYLENOL) 500 MG tablet Take 500 mg by mouth as needed.    [name] apixaban (ELIQUIS) 5 MG TABS tablet Take 1 tablet (5 mg total) by mouth 2 (two) times daily. 60 tablet 0  . Calcium Carbonate-Vitamin D (CALCIUM PLUS VITAMIN D PO) Take 2 tablets by mouth daily.     [name] diltiazem (CARDIZEM CD) 300 MG 24 hr capsule Take 1 capsule (300 mg total) by mouth daily. 90 capsule 3  . Eyelid Cleansers (AVENOVA) 0.01 % SOLN Apply topically daily. Pt wipes the eyelid once daily    . ibuprofen (ADVIL,MOTRIN) 800 MG tablet Take 800 mg by mouth 2 (two) times daily as needed for moderate pain.     [name] LORazepam (ATIVAN) 0.5 MG tablet Take 1 tablet (0.5 mg total) by mouth 3 (three) times daily as needed for anxiety. 20 tablet 0  . montelukast (SINGULAIR) 10 MG tablet Take 1 tablet (10 mg total) by mouth daily. 90 tablet 3  . Multiple Vitamins-Minerals (PRESERVISION AREDS PO) Take 1 tablet by mouth 2 (two) times daily.     . propranolol (INDERAL) 10 MG tablet Take 1 tablet (10 mg total) by mouth every 4 (four) hours as needed. 120 tablet 6   No current facility-administered medications for this visit.    Past Medical History  Diagnosis Date    . Osteopenia   . Allergic rhinitis   . Clavicle fracture 3/08    left  . CHEST WALL PAIN, ACUTE 07/21/2010    Qualifier: Diagnosis of  By: Sarajane Jews MD, Ishmael Holter   . Aortic insufficiency   . CAT SCRATCH 09/10/2009    Qualifier: Diagnosis of  By: Regis Bill MD, [name]   . Vision disturbance 03/13/2013    Negative neuro MRA MRI  . Heart palpitations   . Diverticulosis   . HTN (hypertension)     Past Surgical History  Procedure Laterality Date  . Tonsillectomy and adenoidectomy    . Laparoscopic salpingo oopherectomy Right   . Appendectomy    . Abdominal hysterectomy      pt denies 04/15/14    ROS:  As stated in the HPI and negative for all other systems.  PHYSICAL EXAM BP 122/76 mmHg  Pulse 80  Ht 5' 5.5" (1.664 m)  Wt 112 lb (50.803 kg)  BMI 18.35 kg/m2 GENERAL:  Well appearing NECK:  No jugular venous distention, waveform within normal limits, carotid upstroke brisk and symmetric, no bruits, no thyromegaly LUNGS:  Clear to auscultation bilaterally CHEST:  Unremarkable HEART:  PMI not displaced or sustained,S1 and S2 within normal limits, no S3, no S4, no clicks, no rubs, diastolic murmur mid to late at the left 3rd intercostal space. ABD:  Flat, positive bowel sounds  normal in frequency in pitch, no bruits, no rebound, no guarding, no midline pulsatile mass, no hepatomegaly, no splenomegaly EXT:  2 plus pulses throughout, no edema, no cyanosis no clubbing  EKG;  sinus rhythm, rate 80, axis within normal limits, intervals within normal limits, no acute ST-T wave changes. 11/23/2015  ASSESSMENT AND PLAN  Aortic insufficiency -  No change in therapy at this point.  I will be repeating an echocardiogram in October  Atrial fib-  [name] [name] has a CHA2DS2 - VASc score of 2. She will continue Eliquis.  For now she will continue with rate control and increased Cardizem.  If she has any increasing frequency or severity of her episodes she will come back and we'll talk about starting  flecainide.   HTN - The blood pressure is at target. No change in medications is indicated. We will continue with therapeutic lifestyle changes (TLC).

## 2016-03-09 ENCOUNTER — Ambulatory Visit: Payer: Self-pay | Admitting: Internal Medicine

## 2016-03-12 NOTE — Progress Notes (Signed)
HPI The patient presents for follow up of palpitations and aortic insufficiency. She wore an event monitor which demonstrated paroxysmal atrial fibrillation. Since I last saw her she said another couple of episodes still. The episodes last for minutes to  hours. She'll take the propranolol and this seems to help.  They are associated with decreased sleep and stress. She otherwise has been feeling relatively well.   She's not describing chest pressure, neck or arm discomfort. She's not describing PND or orthopnea. She's had no weight gain or edema.    Allergies  Allergen Reactions  . Codeine Phosphate     drowsy  . Demerol [Meperidine]   . Meperidine Hcl Nausea Only    Current Outpatient Prescriptions  Medication Sig Dispense Refill  . acetaminophen (TYLENOL) 500 MG tablet Take 500 mg by mouth as needed.    [name] apixaban (ELIQUIS) 5 MG TABS tablet Take 1 tablet (5 mg total) by mouth 2 (two) times daily. 60 tablet 0  . Calcium Carbonate-Vitamin D (CALCIUM PLUS VITAMIN D PO) Take 2 tablets by mouth daily.     [name] diltiazem (CARDIZEM CD) 300 MG 24 hr capsule Take 1 capsule (300 mg total) by mouth daily. 90 capsule 3  . Eyelid Cleansers (AVENOVA) 0.01 % SOLN Apply topically daily. Pt wipes the eyelid once daily    . ibuprofen (ADVIL,MOTRIN) 800 MG tablet Take 800 mg by mouth 2 (two) times daily as needed for moderate pain.     [name] LORazepam (ATIVAN) 0.5 MG tablet Take 1 tablet (0.5 mg total) by mouth 3 (three) times daily as needed for anxiety. 20 tablet 0  . montelukast (SINGULAIR) 10 MG tablet Take 1 tablet (10 mg total) by mouth daily. 90 tablet 3  . Multiple Vitamins-Minerals (PRESERVISION AREDS PO) Take 1 tablet by mouth 2 (two) times daily.     . propranolol (INDERAL) 10 MG tablet Take 1 tablet (10 mg total) by mouth every 4 (four) hours as needed. 120 tablet 6   No current facility-administered medications for this visit.    Past Medical History  Diagnosis Date  . Osteopenia   .  Allergic rhinitis   . Clavicle fracture 3/08    left  . CHEST WALL PAIN, ACUTE 07/21/2010    Qualifier: Diagnosis of  By: Sarajane Jews MD, Ishmael Holter   . Aortic insufficiency   . CAT SCRATCH 09/10/2009    Qualifier: Diagnosis of  By: Regis Bill MD, [name]   . Vision disturbance 03/13/2013    Negative neuro MRA MRI  . Heart palpitations   . Diverticulosis   . HTN (hypertension)     Past Surgical History  Procedure Laterality Date  . Tonsillectomy and adenoidectomy    . Laparoscopic salpingo oopherectomy Right   . Appendectomy    . Abdominal hysterectomy      pt denies 04/15/14    ROS:  As stated in the HPI and negative for all other systems.  PHYSICAL EXAM BP 141/76 mmHg  Pulse 81  Ht 5\' 5"  (1.651 m)  Wt 111 lb 12.8 oz (50.712 kg)  BMI 18.60 kg/m2 GENERAL:  Well appearing NECK:  No jugular venous distention, waveform within normal limits, carotid upstroke brisk and symmetric, no bruits, no thyromegaly LUNGS:  Clear to auscultation bilaterally CHEST:  Unremarkable HEART:  PMI not displaced or sustained,S1 and S2 within normal limits, no S3, no S4, no clicks, no rubs, diastolic murmur mid to late at the left 3rd intercostal space. ABD:  Flat, positive bowel sounds  normal in frequency in pitch, no bruits, no rebound, no guarding, no midline pulsatile mass, no hepatomegaly, no splenomegaly EXT:  2 plus pulses throughout, no edema, no cyanosis no clubbing   ASSESSMENT AND PLAN  Aortic insufficiency -  No change in therapy at this point.  I will be repeating an echocardiogram in October and I will see her after that.   Atrial fib-  [name] [name] has a CHA2DS2 - VASc score of 2. She will continue Eliquis.  For now she will continue with rate control and increased Cardizem.  If she has any increasing frequency or severity of her episodes she will come back and we'll talk about starting flecainide.  However we both agree that we are not at that point yet.   HTN - The blood pressure is at  target (barely). No change in medications is indicated. We will continue with therapeutic lifestyle changes (TLC).

## 2016-03-13 ENCOUNTER — Encounter: Payer: Self-pay | Admitting: Cardiology

## 2016-03-13 ENCOUNTER — Ambulatory Visit (INDEPENDENT_AMBULATORY_CARE_PROVIDER_SITE_OTHER): Payer: BLUE CROSS/BLUE SHIELD | Admitting: Cardiology

## 2016-03-13 VITALS — BP 141/76 | HR 81 | Ht 65.0 in | Wt 111.8 lb

## 2016-03-13 DIAGNOSIS — I480 Paroxysmal atrial fibrillation: Secondary | ICD-10-CM | POA: Diagnosis not present

## 2016-03-13 NOTE — Patient Instructions (Signed)
Medication Instructions:  Continue all medication therapy  Labwork: NONE  Testing/Procedures: NONE  Follow-Up: Your physician wants you to follow-up in: 6 Months You will receive a reminder letter in the mail two months in advance. If you don't receive a letter, please call our office to schedule the follow-up appointment.   Any Other Special Instructions Will Be Listed Below (If Applicable).   If you need a refill on your cardiac medications before your next appointment, please call your pharmacy.

## 2016-03-16 ENCOUNTER — Encounter: Payer: Self-pay | Admitting: Internal Medicine

## 2016-03-16 ENCOUNTER — Ambulatory Visit (INDEPENDENT_AMBULATORY_CARE_PROVIDER_SITE_OTHER): Payer: BLUE CROSS/BLUE SHIELD | Admitting: Internal Medicine

## 2016-03-16 VITALS — BP 122/62 | HR 65 | Ht 65.5 in | Wt 111.0 lb

## 2016-03-16 DIAGNOSIS — J3089 Other allergic rhinitis: Principal | ICD-10-CM

## 2016-03-16 DIAGNOSIS — J302 Other seasonal allergic rhinitis: Secondary | ICD-10-CM

## 2016-03-16 NOTE — Patient Instructions (Addendum)
We suggested trying for the thick mucus at bedtime::  Otc saline nasal spray (Ayr, Ocean, or etc) ad lib, but especially at bedtime  Otc nasal steroid spray like Flonase/ fluticasone    1-2 puffs each nostril once daily at bedtime  Drink more water during the day  Elevate head of bed a little  Refill script sent for Singulair

## 2016-03-16 NOTE — Progress Notes (Signed)
Patient ID: [name] [name], female    DOB: [birth year], 65 y.o.   MRN: [id number]  HPI 3 yoF followed for allergic rhinitis. Last here February 11, 2010 and reports no major health changes. Being outdoors more recently she has noted some nasal congestion. Bothered more by watery drainage. We discussed available decongestants and antihistamines. singulair has worked better for her than antihistamines and we explained the difference again.  02/09/12- 26 yoF never smoker followed for allergic rhinitis. She had a prolonged bronchitis episode this winter but is better now. Evaluated for palpitation with echocardiogram and diagnosed aortic insufficiency, treated with diltiazem. Dr Percival Spanish. Control is comfortable with no problems this spring. Singulair helps and is sufficient without an antihistamine.  02/13/13- 48 yoF never smoker followed for allergic rhinitis. FOLLOWS FOR: Allergies are doing well. Might have had a mold exposure. Singulair is helping to manage her symptoms. She got into mold exposure while cleaning a house last summer. Made her feel congested and tired. Doing well now.  02/19/14- 62 yoF never smoker followed for allergic rhinitis. 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 And she has done well with a few colds over the last winter. Uses nasal saline, Singulair and Flonase appropriately when needed.  03/04/15- 62 yoF never smoker followed for allergic rhinitis. 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 She feels this as a sensation in her throat and interpreted as postnasal drainage. Not much nose blowing or sneezing and no headache. We'll explore possibility she was feeling effects of reflux when supine.  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 HEENT:   No-  headaches, difficulty swallowing, tooth/dental problems, sore throat,       No-  sneezing, itching, ear ache, nasal congestion, +post nasal drip,  CV:  No-   chest pain, orthopnea, PND, swelling in lower extremities, anasarca, dizziness, palpitations Resp: No-   shortness of breath with exertion or at rest.              No-   productive cough,  No non-productive cough,  No- coughing up of blood.              No-   change in color of mucus.  No- wheezing.   Skin: No-   rash or lesions. GI:  No-   heartburn, indigestion, abdominal pain, nausea, vomiting,  GU:  MS:  No-   joint pain or swelling.   Neuro-     nothing unusual Psych:  No- change in mood or affect. No depression or anxiety.  No memory loss.   OBJ- Physical Exam General- Alert, Oriented, Affect-appropriate, Distress- none acute .  Trim. Skin- rash-none, lesions- none, excoriation- none Lymphadenopathy- none Head- atraumatic            Eyes- Gross vision intact, PERRLA, conjunctivae and secretions clear            Ears- Hearing, canals-normal            Nose- Clear, pale, no-Septal dev, mucus, polyps, erosion, perforation             Throat- Mallampati II , mucosa clear , drainage- none, tonsils- atrophic Neck- flexible , trachea midline, no stridor ,  thyroid nl, carotid no bruit Chest - symmetrical excursion , unlabored           Heart/CV- RRR , trace diastolic murmur L sternum , no gallop  , no rub, nl s1 s2                           - JVD- none , edema- none, stasis changes- none, varices- none           Lung- clear to P&A, wheeze- none, cough- none , dullness-none, rub- none           Chest wall-  Abd- Br/ Gen/ Rectal- Not done, not indicated Extrem- cyanosis- none, clubbing, none, atrophy- none, strength- nl Neuro- grossly intact to observation

## 2016-03-30 ENCOUNTER — Encounter: Payer: Self-pay | Admitting: Podiatry

## 2016-03-30 ENCOUNTER — Ambulatory Visit (INDEPENDENT_AMBULATORY_CARE_PROVIDER_SITE_OTHER): Payer: BLUE CROSS/BLUE SHIELD | Admitting: Podiatry

## 2016-03-30 VITALS — BP 121/68 | HR 68 | Resp 16 | Ht 65.0 in | Wt 111.0 lb

## 2016-03-30 DIAGNOSIS — L84 Corns and callosities: Secondary | ICD-10-CM | POA: Diagnosis not present

## 2016-03-30 DIAGNOSIS — M21619 Bunion of unspecified foot: Secondary | ICD-10-CM

## 2016-03-30 NOTE — Progress Notes (Signed)
   Subjective:    Patient ID: [name] [name], female    DOB: 1950/11/14, 65 y.o.   MRN: [id number]  HPI Chief Complaint  Patient presents with  . Callouses    Bilateral; plantar forefoot; pt stated, "Left foot hurts more"; x1 month      Review of Systems  All other systems reviewed and are negative.      Objective:   Physical Exam        Assessment & Plan:

## 2016-03-31 NOTE — Progress Notes (Signed)
Subjective:     Patient ID: [name] [name], female   DOB: [birth year], 64 y.o.   MRN: [id number]  HPI patient presents stating she has calluses under her foot left over right and she's on a blood thinner and cannot take care of them herself   Review of Systems  All other systems reviewed and are negative.      Objective:   Physical Exam  Constitutional: She is oriented to person, place, and time.  Cardiovascular: Intact distal pulses.   Musculoskeletal: Normal range of motion.  Neurological: She is oriented to person, place, and time.  Skin: Skin is warm.  Nursing note and vitals reviewed.  neurovascular status intact muscle strength adequate range of motion within normal limits with patient noted to have keratotic lesion sub-second metatarsal left over right with large structural bunion deformities bilateral that she accommodates with soft shoe gear and become occasionally painful but have grown in recent years. Good digital perfusion and well oriented 3     Assessment:     Structural damage to both feet with keratotic lesion sub-second metatarsal left over right that's painful when pressed    Plan:     H&P and condition reviewed with patient. Using sterile instrumentation debridement accomplished with no iatrogenic bleeding and patient's instructed on wearing soft-type shoes and will be seen back to recheck

## 2016-04-05 ENCOUNTER — Telehealth: Payer: Self-pay | Admitting: Pharmacist Clinician (PhC)/ Clinical Pharmacy Specialist

## 2016-04-05 NOTE — Telephone Encounter (Signed)
Pt had dental work done today and was advised to take ibuprofen not acetaminophen to help with pain/swelling.  Wanted to know if this is okay.  Patient has some ibuprofen 800 mg tablets at home.  Advised that she can take 800 mg bid x 2 days (with food), then she can decrease to 200 mg tablets 1-2 times a day if needed for another 1-2 days after that.  Patient voiced understanding.

## 2016-04-30 ENCOUNTER — Other Ambulatory Visit: Payer: Self-pay | Admitting: Internal Medicine

## 2016-06-02 ENCOUNTER — Telehealth: Payer: Self-pay | Admitting: Pharmacist

## 2016-06-02 NOTE — Telephone Encounter (Signed)
Returned call to Pt asking if ok to take lorazepam/propanolol when she goes on her trip to [place]. LM ok to take her lorazepam and propranolol as prescribed. Call with any other issues.

## 2016-06-29 LAB — HM MAMMOGRAPHY

## 2016-07-04 ENCOUNTER — Encounter: Payer: Self-pay | Admitting: Family Medicine

## 2016-07-05 ENCOUNTER — Ambulatory Visit (INDEPENDENT_AMBULATORY_CARE_PROVIDER_SITE_OTHER): Payer: BLUE CROSS/BLUE SHIELD | Admitting: Family Medicine

## 2016-07-05 ENCOUNTER — Ambulatory Visit (INDEPENDENT_AMBULATORY_CARE_PROVIDER_SITE_OTHER)
Admission: RE | Admit: 2016-07-05 | Discharge: 2016-07-05 | Disposition: A | Discharge status: Home / Self Care | Payer: BLUE CROSS/BLUE SHIELD | Source: Ambulatory Visit | Attending: Family Medicine | Admitting: Family Medicine

## 2016-07-05 VITALS — BP 110/70 | HR 97 | Temp 98.3°F | Ht 65.0 in | Wt 109.9 lb

## 2016-07-05 DIAGNOSIS — R059 Cough, unspecified: Secondary | ICD-10-CM

## 2016-07-05 DIAGNOSIS — R05 Cough: Secondary | ICD-10-CM | POA: Diagnosis not present

## 2016-07-05 DIAGNOSIS — R0789 Other chest pain: Secondary | ICD-10-CM

## 2016-07-05 MED ORDER — LEVOFLOXACIN 500 MG PO TABS: 500.0000 mg | ORAL_TABLET | Freq: Every day | ORAL | 0 refills | Status: DC

## 2016-07-05 NOTE — Patient Instructions (Signed)
Follow up for any increased shortness of breath or any fever.

## 2016-07-05 NOTE — Progress Notes (Signed)
Subjective:     Patient ID: [name] [name], female   DOB: 08/17/1951, 65 y.o.   MRN: [id number]  HPI Patient seen with 2 week history of "cold" symptoms. Started initially with sore throat and cough. Persistent/worsening cough at this point which is frequently productive of clear sputum. Past couple days had some right chest wall pain which she thinks may related to coughing. No pleuritic pain. No dyspnea. She's had some night sweats past couple nights but no documented fever. Occasional chills. Never smoked. No hemoptysis. No sick contacts.  No nausea or vomiting.  Still working past couple of weeks. No dyspnea with walking and day to day activities.  Past Medical History:  Diagnosis Date  . A-fib ([place])   . Allergic rhinitis   . Aortic insufficiency   . CAT SCRATCH 09/10/2009   Qualifier: Diagnosis of  By: Regis Bill MD, [name]   . CHEST WALL PAIN, ACUTE 07/21/2010   Qualifier: Diagnosis of  By: Sarajane Jews MD, Ishmael Holter   . Clavicle fracture 3/08   left  . Diverticulosis   . Heart palpitations   . HTN (hypertension)   . Osteopenia   . Vision disturbance 03/13/2013   Negative neuro MRA MRI   Past Surgical History:  Procedure Laterality Date  . ABDOMINAL HYSTERECTOMY     pt denies 04/15/14  . APPENDECTOMY    . LAPAROSCOPIC SALPINGO OOPHERECTOMY Right   . TONSILLECTOMY AND ADENOIDECTOMY      reports that she has never smoked. She has never used smokeless tobacco. She reports that she does not drink alcohol or use drugs. family history includes Coronary artery disease (age of onset: 76) in her father; Esophageal cancer in her paternal grandmother; Hyperlipidemia in her mother; Scleroderma in her mother. Allergies  Allergen Reactions  . Codeine Phosphate     drowsy  . Demerol [Meperidine]   . Meperidine Hcl Nausea Only     Review of Systems  Constitutional: Positive for chills. Negative for fever.  Respiratory: Positive for cough. Negative for shortness of breath and wheezing.    Cardiovascular: Negative for chest pain, palpitations and leg swelling.  Gastrointestinal: Negative for nausea and vomiting.  Genitourinary: Negative for dysuria.  Neurological: Negative for syncope.  Hematological: Negative for adenopathy.  Psychiatric/Behavioral: Negative for confusion.       Objective:   Physical Exam  Constitutional: She is oriented to person, place, and time. She appears well-developed and well-nourished.  HENT:  Right Ear: External ear normal.  Left Ear: External ear normal.  Mouth/Throat: Oropharynx is clear and moist.  Neck: Neck supple. No thyromegaly present.  Cardiovascular: Normal rate and regular rhythm.  Exam reveals no friction rub.   Pulmonary/Chest: Effort normal and breath sounds normal. No respiratory distress. She has no wheezes. She has no rales.  Musculoskeletal: She exhibits no edema.  Lymphadenopathy:    She has no cervical adenopathy.  Neurological: She is alert and oriented to person, place, and time.  Skin: No rash noted.       Assessment:     2 week history of cough with some right sided chest wall pain. Suspect she has musculoskeletal strain related to coughing-no pleuritic pain. Nonfocal exam at this point.  Rule out community acquired pneumonia with recent night sweats and possible fever-though no rales noted on exam.    Plan:     -Obtain chest x-ray to further evaluate Follow-up promptly for any fever or increased shortness of breath  Chest x-ray shows infiltrate right lower lobe and  infrahilar region. Patient was notified. She has no fever. No dyspnea. Start Levaquin 500 milligrams once daily for 10 days. We have recommended that she follow-up with her primary in 2 weeks to reassess and sooner as needed for any shortness of breath or other concerns.  Eulas Post MD [facility]

## 2016-07-05 NOTE — Progress Notes (Signed)
Pre visit review using our clinic review tool, if applicable. No additional management support is needed unless otherwise documented below in the visit note. 

## 2016-07-06 ENCOUNTER — Telehealth: Payer: Self-pay | Admitting: Internal Medicine

## 2016-07-06 NOTE — Telephone Encounter (Signed)
Pt would like a call back to go over chest xray again. Needs peace of mind this is not anything else besides PNA. Pt states she has some questions and forgot to ask. Please call back asap on cell phone.

## 2016-07-07 NOTE — Telephone Encounter (Signed)
Spoke with pt and reassured her that her xray did show pneumonia not cancer.

## 2016-07-07 NOTE — Telephone Encounter (Signed)
Left message for pt to call back  °

## 2016-07-17 NOTE — Progress Notes (Signed)
Pre visit review using our clinic review tool, if applicable. No additional management support is needed unless otherwise documented below in the visit note.  Chief Complaint  Patient presents with  . Follow-up    HPI: [name] [name] 65 y.o.   rx pnarll   rx levaquin for 10 days   Finished   Fridaysept 15  levaquin 10 days  Cough is better  And pain in side   side around bra line  No fever  But had night sweats .   Onset after  Going to [place] after 4 days  return  Sore throat  loike a cold and then  Very little  Upper  resp sx but bad coughing and   Above  Has chonric  mucous feeling in throat .   ? If reflux . ?Allergist.  nght wjhen lays dowm  seses cardiology   2 x per year .      And a fib   And thus  On  Anticoagulation   Energy level is better    .  stil some tired  Back to work .  Has ?as about  Hep c screen    Other screeing   bp up here but ok at [place]?  On med per cards  Gets l;abs at work but only  Lipids ?   Ask about getting fu dexa  No recent fractures   Hx of on med remote past .    Study Result   CLINICAL DATA:  Cough, right chest wall pain, congestion  EXAM: CHEST  2 VIEW  COMPARISON:  None.  FINDINGS: Cardiomediastinal silhouette is unremarkable. There is infiltrate/pneumonia in right lower lobe infrahilar. Follow-up to resolution after appropriate treatment is recommended. Mild mid thoracic dextroscoliosis. No pulmonary edema.  IMPRESSION: Infiltrate/pneumonia in right lower lobe infrahilar. Follow-up to resolution after appropriate treatment is recommended.   Electronically Signed   By: Lahoma Crocker M.D.   On: 07/05/2016 14:05    ROS: See pertinent positives and negatives per HPI. A fibe on  Anticoagulation    Past Medical History:  Diagnosis Date  . A-fib ([place])   . Allergic rhinitis   . Aortic insufficiency   . CAT SCRATCH 09/10/2009   Qualifier: Diagnosis of  By: Regis Bill MD, [name]   . CHEST WALL PAIN, ACUTE 07/21/2010   Qualifier:  Diagnosis of  By: Sarajane Jews MD, Ishmael Holter   . Clavicle fracture 3/08   left  . Diverticulosis   . Heart palpitations   . HTN (hypertension)   . Osteopenia   . Vision disturbance 03/13/2013   Negative neuro MRA MRI    Family History  Problem Relation Age of Onset  . Scleroderma Mother   . Hyperlipidemia Mother   . Coronary artery disease Father 48  . Esophageal cancer Paternal Grandmother     Social History   Social History  . Marital status: Married    Spouse name: N/A  . Number of children: 0  . Years of education: N/A   Occupational History  . 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 Smoking status: Never Smoker  . Smokeless tobacco: Never Used  . Alcohol use No     Comment: never  . Drug use: No  . Sexual activity: Not Asked   Other Topics Concern  . None   Social History Narrative   Married   hh of 2    p0g0   Works  for [employer]     Outpatient Medications Prior to Visit  Medication Sig Dispense Refill  . acetaminophen (TYLENOL) 500 MG tablet Take 500 mg by mouth as needed.    [name] apixaban (ELIQUIS) 5 MG TABS tablet Take 1 tablet (5 mg total) by mouth 2 (two) times daily. 60 tablet 0  . Calcium Carbonate-Vitamin D (CALCIUM PLUS VITAMIN D PO) Take 2 tablets by mouth daily.     [name] diltiazem (CARDIZEM CD) 300 MG 24 hr capsule Take 1 capsule (300 mg total) by mouth daily. 90 capsule 3  . Eyelid Cleansers (AVENOVA) 0.01 % SOLN Apply topically daily. Pt wipes the eyelid once daily    . ibuprofen (ADVIL,MOTRIN) 800 MG tablet Take 800 mg by mouth 2 (two) times daily as needed for moderate pain.     [name] LORazepam (ATIVAN) 0.5 MG tablet Take 1 tablet (0.5 mg total) by mouth 3 (three) times daily as needed for anxiety. 20 tablet 0  . montelukast (SINGULAIR) 10 MG tablet TAKE 1 TABLET BY MOUTH DAILY 90 tablet 1  . Multiple Vitamins-Minerals (PRESERVISION AREDS PO) Take 1 tablet by mouth 2 (two) times daily.     . propranolol (INDERAL)  10 MG tablet Take 1 tablet (10 mg total) by mouth every 4 (four) hours as needed. 120 tablet 6  . levofloxacin (LEVAQUIN) 500 MG tablet Take 1 tablet (500 mg total) by mouth daily. 10 tablet 0   No facility-administered medications prior to visit.      EXAM:  BP (!) 142/70   Pulse 70   Temp 98.1 F (36.7 C) (Oral)   Wt 109 lb 9.6 oz (49.7 kg)   SpO2 97%   BMI 18.24 kg/m   Body mass index is 18.24 kg/m.  GENERAL: vitals reviewed and listed above, alert, oriented, appears well hydrated and in no acute distress HEENT: atraumatic, conjunctiva  clear, no obvious abnormalities on inspection of external nose and ears NECK: no obvious masses on inspection palpation  LUNGS: clear to auscultation bilaterally, no wheezes, rales or rhonchi, good air movement CV: HRRR, ocass irreg beat  no clubbing cyanosis slight tp no  peripheral edema nl cap refill  MS: moves all extremities without noticeable focal  abnormality PSYCH: pleasant and cooperative, no obvious depression or anxiety  ASSESSMENT AND PLAN:  Discussed the following assessment and plan:  RLL pneumonia - improved plan fu x ray  1 month out - Plan: DG Chest 2 View  Elevated BP  Osteopenia - Plan: DG Bone Density  Need for prophylactic vaccination and inoculation against influenza - Plan: Flu Vaccine QUAD 36+ mos PF IM (Fluarix & Fluzone Quad PF)  Aortic insufficiency  Paroxysmal atrial fibrillation (HCC)   Expectant management. Improving as expected may continue to have some fatigue. Plan get follow-up chest x-ray a month out beginning of October or thereabouts. We reviewed health care maintenance preventive parameters. Not high risk get hep C and HIV DEXA scan on schedule for wellness with CPX labs. Flu shot appropriate today. Can review pneumonia vaccines at her preventive visit. Discussed blood pressure in readings she is on medications to slow heart rate but also hypertension advise check readings for week or so twice a  day appropriately obtained and send those readings in to [place] or Dr. Percival Spanish ?s answered as best possible .  -Patient advised to return or notify health care team  if symptoms worsen ,persist or new concerns arise.  Patient Instructions  Flu vaccine today   Plan preventive  visit  In   1-2 months   . Cpx labs and hep c aby and hiv aby screen ...  Will review  Your record about dexa and   Let you know about gettting this done   Before check up.   Fatigue  Can  Last for weeks after .      Pneumonia has resolved.    But let us know if relapsing sx in the interim.   Take blood pressure readings twice a day for 7- 10 days and then periodically .To ensure below 140/90   .record readings   Can send in on my chart  To document  Or other.       [name]. [name] M.D.

## 2016-07-18 ENCOUNTER — Ambulatory Visit (INDEPENDENT_AMBULATORY_CARE_PROVIDER_SITE_OTHER): Payer: BLUE CROSS/BLUE SHIELD | Admitting: Internal Medicine

## 2016-07-18 ENCOUNTER — Encounter: Payer: Self-pay | Admitting: Internal Medicine

## 2016-07-18 VITALS — BP 142/70 | HR 70 | Temp 98.1°F | Wt 109.6 lb

## 2016-07-18 DIAGNOSIS — I351 Nonrheumatic aortic (valve) insufficiency: Secondary | ICD-10-CM | POA: Diagnosis not present

## 2016-07-18 DIAGNOSIS — Z23 Encounter for immunization: Secondary | ICD-10-CM | POA: Diagnosis not present

## 2016-07-18 DIAGNOSIS — M8580 Other specified disorders of bone density and structure, unspecified site: Secondary | ICD-10-CM

## 2016-07-18 DIAGNOSIS — J189 Pneumonia, unspecified organism: Secondary | ICD-10-CM | POA: Diagnosis not present

## 2016-07-18 DIAGNOSIS — IMO0001 Reserved for inherently not codable concepts without codable children: Secondary | ICD-10-CM

## 2016-07-18 DIAGNOSIS — J181 Lobar pneumonia, unspecified organism: Principal | ICD-10-CM

## 2016-07-18 DIAGNOSIS — I480 Paroxysmal atrial fibrillation: Secondary | ICD-10-CM

## 2016-07-18 DIAGNOSIS — R030 Elevated blood-pressure reading, without diagnosis of hypertension: Secondary | ICD-10-CM

## 2016-07-18 NOTE — Patient Instructions (Addendum)
Flu vaccine today   Plan preventive visit  In   1-2 months   . Cpx labs and hep c aby and hiv aby screen ...  Will review  Your record about dexa and   Let you know about gettting this done   Before check up.   Fatigue  Can  Last for weeks after .      Pneumonia has resolved.    But let us know if relapsing sx in the interim.   Take blood pressure readings twice a day for 7- 10 days and then periodically .To ensure below 140/90   .record readings   Can send in on my chart  To document  Or other.

## 2016-07-25 ENCOUNTER — Ambulatory Visit (INDEPENDENT_AMBULATORY_CARE_PROVIDER_SITE_OTHER)
Admission: RE | Admit: 2016-07-25 | Discharge: 2016-07-25 | Disposition: A | Discharge status: Home / Self Care | Payer: BLUE CROSS/BLUE SHIELD | Source: Ambulatory Visit | Attending: Internal Medicine | Admitting: Internal Medicine

## 2016-07-25 DIAGNOSIS — M8580 Other specified disorders of bone density and structure, unspecified site: Secondary | ICD-10-CM | POA: Diagnosis not present

## 2016-07-27 DIAGNOSIS — M8580 Other specified disorders of bone density and structure, unspecified site: Secondary | ICD-10-CM | POA: Diagnosis not present

## 2016-08-02 ENCOUNTER — Other Ambulatory Visit: Payer: Self-pay | Admitting: Family Medicine

## 2016-08-02 DIAGNOSIS — Z0000 Encounter for general adult medical examination without abnormal findings: Secondary | ICD-10-CM

## 2016-08-02 DIAGNOSIS — E559 Vitamin D deficiency, unspecified: Secondary | ICD-10-CM

## 2016-08-02 DIAGNOSIS — Z0184 Encounter for antibody response examination: Secondary | ICD-10-CM

## 2016-08-02 DIAGNOSIS — M8580 Other specified disorders of bone density and structure, unspecified site: Secondary | ICD-10-CM

## 2016-08-03 ENCOUNTER — Other Ambulatory Visit: Payer: Self-pay | Admitting: Cardiology

## 2016-08-06 ENCOUNTER — Encounter: Payer: Self-pay | Admitting: Internal Medicine

## 2016-08-08 ENCOUNTER — Other Ambulatory Visit: Payer: Self-pay

## 2016-08-08 ENCOUNTER — Ambulatory Visit (HOSPITAL_COMMUNITY): Payer: BLUE CROSS/BLUE SHIELD | Attending: Cardiovascular Disease

## 2016-08-08 DIAGNOSIS — I351 Nonrheumatic aortic (valve) insufficiency: Secondary | ICD-10-CM | POA: Diagnosis not present

## 2016-08-11 ENCOUNTER — Ambulatory Visit (INDEPENDENT_AMBULATORY_CARE_PROVIDER_SITE_OTHER)
Admission: RE | Admit: 2016-08-11 | Discharge: 2016-08-11 | Disposition: A | Discharge status: Home / Self Care | Payer: BLUE CROSS/BLUE SHIELD | Source: Ambulatory Visit | Attending: Internal Medicine | Admitting: Internal Medicine

## 2016-08-11 DIAGNOSIS — J189 Pneumonia, unspecified organism: Secondary | ICD-10-CM

## 2016-08-11 DIAGNOSIS — J181 Lobar pneumonia, unspecified organism: Secondary | ICD-10-CM | POA: Diagnosis not present

## 2016-09-11 ENCOUNTER — Telehealth: Payer: Self-pay | Admitting: Pharmacist Clinician (PhC)/ Clinical Pharmacy Specialist

## 2016-09-11 NOTE — Telephone Encounter (Signed)
Patient concerned, took evening dose of Eliquis at 1pm today.  Needs to know what to do.  Returned call, LMOM.  Advised to continue tomorrow morning with twice daily dose.

## 2016-09-14 ENCOUNTER — Other Ambulatory Visit (INDEPENDENT_AMBULATORY_CARE_PROVIDER_SITE_OTHER): Payer: BLUE CROSS/BLUE SHIELD

## 2016-09-14 DIAGNOSIS — Z0184 Encounter for antibody response examination: Secondary | ICD-10-CM

## 2016-09-14 DIAGNOSIS — Z0000 Encounter for general adult medical examination without abnormal findings: Secondary | ICD-10-CM | POA: Diagnosis not present

## 2016-09-14 DIAGNOSIS — E559 Vitamin D deficiency, unspecified: Secondary | ICD-10-CM | POA: Diagnosis not present

## 2016-09-14 DIAGNOSIS — M8580 Other specified disorders of bone density and structure, unspecified site: Secondary | ICD-10-CM

## 2016-09-14 LAB — LIPID PANEL
CHOL/HDL RATIO: 3
Cholesterol: 238 mg/dL — ABNORMAL HIGH (ref 0–200)
HDL: 87.5 mg/dL (ref >39.00)
LDL Cholesterol: 137 mg/dL — ABNORMAL HIGH (ref 0–99)
NONHDL: 150.63
Triglycerides: 68 mg/dL (ref 0.0–149.0)
VLDL: 13.6 mg/dL (ref 0.0–40.0)

## 2016-09-14 LAB — HEPATIC FUNCTION PANEL
ALBUMIN: 4.5 g/dL (ref 3.5–5.2)
ALK PHOS: 42 U/L (ref 39–117)
ALT: 15 U/L (ref 0–35)
AST: 20 U/L (ref 0–37)
Bilirubin, Direct: 0.1 mg/dL (ref 0.0–0.3)
TOTAL PROTEIN: 7.3 g/dL (ref 6.0–8.3)
Total Bilirubin: 0.8 mg/dL (ref 0.2–1.2)

## 2016-09-14 LAB — CBC WITH DIFFERENTIAL/PLATELET
BASOS ABS: 0 10*3/uL (ref 0.0–0.1)
Basophils Relative: 0.5 % (ref 0.0–3.0)
EOS PCT: 1.7 % (ref 0.0–5.0)
Eosinophils Absolute: 0.1 10*3/uL (ref 0.0–0.7)
HEMATOCRIT: 43.6 % (ref 36.0–46.0)
Hemoglobin: 14.6 g/dL (ref 12.0–15.0)
LYMPHS ABS: 1.3 10*3/uL (ref 0.7–4.0)
LYMPHS PCT: 28.6 % (ref 12.0–46.0)
MCHC: 33.5 g/dL (ref 30.0–36.0)
MCV: 95.3 fl (ref 78.0–100.0)
MONOS PCT: 10.6 % (ref 3.0–12.0)
Monocytes Absolute: 0.5 10*3/uL (ref 0.1–1.0)
NEUTROS ABS: 2.7 10*3/uL (ref 1.4–7.7)
NEUTROS PCT: 58.6 % (ref 43.0–77.0)
PLATELETS: 243 10*3/uL (ref 150.0–400.0)
RBC: 4.58 Mil/uL (ref 3.87–5.11)
RDW: 13.5 % (ref 11.5–15.5)
WBC: 4.6 10*3/uL (ref 4.0–10.5)

## 2016-09-14 LAB — TSH: TSH: 1.22 u[IU]/mL (ref 0.35–4.50)

## 2016-09-14 LAB — BASIC METABOLIC PANEL
BUN: 16 mg/dL (ref 6–23)
CO2: 30 meq/L (ref 19–32)
Calcium: 9.6 mg/dL (ref 8.4–10.5)
Chloride: 104 mEq/L (ref 96–112)
Creatinine, Ser: 0.69 mg/dL (ref 0.40–1.20)
GFR: 91.07 mL/min (ref >60.00)
GLUCOSE: 87 mg/dL (ref 70–99)
POTASSIUM: 4 meq/L (ref 3.5–5.1)
SODIUM: 142 meq/L (ref 135–145)

## 2016-09-14 LAB — VITAMIN D 25 HYDROXY (VIT D DEFICIENCY, FRACTURES): VITD: 28.88 ng/mL — AB (ref 30.00–100.00)

## 2016-09-15 LAB — PARATHYROID HORMONE, INTACT (NO CA): PTH: 59 pg/mL (ref 14–64)

## 2016-09-15 LAB — HIV ANTIBODY (ROUTINE TESTING W REFLEX): HIV: NONREACTIVE

## 2016-09-15 LAB — HEPATITIS C ANTIBODY: HCV Ab: NEGATIVE

## 2016-09-18 NOTE — Progress Notes (Signed)
Pre visit review using our clinic review tool, if applicable. No additional management support is needed unless otherwise documented below in the visit note.  Chief Complaint  Patient presents with  . Annual Exam    HPI: Patient  [name] [name]  65 y.o. comes in today for Preventive Health Care visit  And chronic disease management healthcare maintenance.  She has a family history of osteoporosis in her mom. She has had low impact fractures. She was on Fosamax last prescription about 2011 or 13 and may have been on it for over 5 years. No recent fracture. Did have a bone density. Has some scoliosis and DJD. Noted on the DEXA scan  Taking calcium vitamin D supplement.  Aortic insufficiency paroxysmal A. fib on anticoagulation she still has some symptoms no bleeding was told recent echo showed no change , takes as needed propranolol for A. fib episodes.  Her blood pressures usually good. Abdomen been exercising as much recently for lots of reasons.    Health Maintenance  Topic Date Due  . TETANUS/TDAP  05/21/2017  . PAP SMEAR  01/25/2018  . MAMMOGRAM  06/29/2018  . COLONOSCOPY  04/20/2024  . INFLUENZA VACCINE  Addressed  . ZOSTAVAX  Completed  . Hepatitis C Screening  Completed  . HIV Screening  Completed   Health Maintenance Review LIFESTYLE:  Exercise:  Not enough  Tobacco/ETS: Alcohol:   Sugar beverages:n Sleep:6-8 interrupted at tiems  To do list  concerns Drug use: no HH of  2 cats  Work:yes    ROS:  GEN/ HEENT: No fever, significant weight changes sweats headaches vision problemsIs on ICaps for eye problem. Macular degeneration. hearing changes, CV/ PULM; No chest pain shortness of breath cough, syncope,edema  change in exercise tolerance. GI /GU: No adominal pain, vomiting, change in bowel habits. No blood in the stool. No significant GU symptoms. SKIN/HEME: ,no acute skin rashes suspicious lesions or bleeding. No lymphadenopathy, nodules, masses.  NEURO/ PSYCH:   No neurologic signs such as weakness numbness. No depression Tends to get anxious on the weekends with things that she has to do does interfere with sleep continues to do as much. anxiety. IMM/ Allergy: No unusual infections.  Allergy .   REST of 12 system review negative except as per HPI   Past Medical History:  Diagnosis Date  . A-fib ([place])   . Allergic rhinitis   . Aortic insufficiency   . CAT SCRATCH 09/10/2009   Qualifier: Diagnosis of  By: Regis Bill MD, [name]   . CHEST WALL PAIN, ACUTE 07/21/2010   Qualifier: Diagnosis of  By: Sarajane Jews MD, Ishmael Holter   . Clavicle fracture 3/08   left  . Diverticulosis   . Heart palpitations   . HTN (hypertension)   . Osteopenia   . Vision disturbance 03/13/2013   Negative neuro MRA MRI    Past Surgical History:  Procedure Laterality Date  . ABDOMINAL HYSTERECTOMY     pt denies 04/15/14  . APPENDECTOMY    . LAPAROSCOPIC SALPINGO OOPHERECTOMY Right   . TONSILLECTOMY AND ADENOIDECTOMY      Family History  Problem Relation Age of Onset  . Scleroderma Mother   . Hyperlipidemia Mother   . Coronary artery disease Father 52  . Esophageal cancer Paternal Grandmother     Social History   Social History  . Marital status: Married    Spouse name: N/A  . Number of children: 0  . Years of education: N/A   Occupational History  .  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 Smoking status: Never Smoker  . Smokeless tobacco: Never Used  . Alcohol use No     Comment: never  . Drug use: No  . Sexual activity: Not Asked   Other Topics Concern  . None   Social History Narrative   Married   hh of 2    p0g0   Works for [employer]     Outpatient Medications Prior to Visit  Medication Sig Dispense Refill  . acetaminophen (TYLENOL) 500 MG tablet Take 500 mg by mouth as needed.    [name] apixaban (ELIQUIS) 5 MG TABS tablet Take 1 tablet (5 mg total) by mouth 2 (two) times daily. 60 tablet 0  . Calcium  Carbonate-Vitamin D (CALCIUM PLUS VITAMIN D PO) Take 2 tablets by mouth daily.     [name] diltiazem (CARTIA XT) 300 MG 24 hr capsule Take 1 capsule (300 mg total) by mouth daily. 90 capsule 3  . Eyelid Cleansers (AVENOVA) 0.01 % SOLN Apply topically daily. Pt wipes the eyelid once daily    . ibuprofen (ADVIL,MOTRIN) 800 MG tablet Take 800 mg by mouth 2 (two) times daily as needed for moderate pain.     [name] LORazepam (ATIVAN) 0.5 MG tablet Take 1 tablet (0.5 mg total) by mouth 3 (three) times daily as needed for anxiety. 20 tablet 0  . montelukast (SINGULAIR) 10 MG tablet TAKE 1 TABLET BY MOUTH DAILY 90 tablet 1  . Multiple Vitamins-Minerals (PRESERVISION AREDS PO) Take 1 tablet by mouth 2 (two) times daily.     . propranolol (INDERAL) 10 MG tablet Take 1 tablet (10 mg total) by mouth every 4 (four) hours as needed. 120 tablet 6   No facility-administered medications prior to visit.      EXAM:  BP 130/62 (BP Location: Right Arm)   Temp 98.1 F (36.7 C) (Oral)   Ht '5\' 5"'  (1.651 m)   Wt 108 lb 3.2 oz (49.1 kg)   BMI 18.01 kg/m   Body mass index is 18.01 kg/m.  Physical Exam: Vital signs reviewed [id number] is a well-developed well-nourished alert cooperative    who appearsr stated age in no acute distress.  HEENT: normocephalic atraumatic , Eyes: PERRL EOM's full, conjunctiva clear, Nares: paten,t no deformity discharge or tenderness., Ears: no deformity EAC's clear TMs with normal landmarks. Mouth: clear OP, no lesions, edema.  Moist mucous membranes. Dentition in adequate repair. NECK: supple without masses, thyromegaly or bruits. CHEST/PULM:  Clear to auscultation and percussion breath sounds equal no wheeze , rales or rhonchi. No chest wall deformities or tenderness. CV: PMI is nondisplaced, S1 S2 no gallops, 2/6 diastolic m no, rubs. Peripheral pulses are full without delay.No JVD . Breast: normal by inspection . No dimpling, discharge, masses, tenderness or discharge . ABDOMEN: Bowel  sounds normal nontender  No guard or rebound, no hepato splenomegal no CVA tenderness.  No hernia. Extremtities:  No clubbing cyanosis or edema, no acute joint swelling or redness no focal atrophy  NEURO:  Oriented x3, cranial nerves 3-12 appear to be intact, no obvious focal weakness,gait within normal limits no abnormal reflexes or asymmetrical SKIN: No acute rashes normal turgor, color, no bruising or petechiae. PSYCH: Oriented, good eye contact, no obvious depression anxiety, cognition and judgment appear normal. LN: no cervical axillary inguinal adenopathy  Lab Results  Component Value Date   WBC 4.6 09/14/2016   HGB 14.6 09/14/2016  HCT 43.6 09/14/2016   PLT 243.0 09/14/2016   GLUCOSE 87 09/14/2016   CHOL 238 (H) 09/14/2016   TRIG 68.0 09/14/2016   HDL 87.50 09/14/2016   LDLDIRECT 131.0 09/03/2009   LDLCALC 137 (H) 09/14/2016   ALT 15 09/14/2016   AST 20 09/14/2016   NA 142 09/14/2016   K 4.0 09/14/2016   CL 104 09/14/2016   CREATININE 0.69 09/14/2016   BUN 16 09/14/2016   CO2 30 09/14/2016   TSH 1.22 09/14/2016   Reviewed blood work with patient. Wt Readings from Last 3 Encounters:  09/19/16 108 lb 3.2 oz (49.1 kg)  07/18/16 109 lb 9.6 oz (49.7 kg)  07/05/16 109 lb 14.4 oz (49.9 kg)   BP Readings from Last 3 Encounters:  09/19/16 130/62  07/18/16 (!) 142/70  07/05/16 110/70    ASSESSMENT AND PLAN:  Discussed the following assessment and plan:  Visit for preventive health examination  Osteoporosis without current pathological fracture, unspecified osteoporosis type - DEXA reviewed family history at risk based vitamin D 5000 units a day discussed other options get consult from endocrinology about osteoporosis porosis manageme - Plan: Ambulatory referral to Endocrinology  Elevated cholesterol - Mild increase from last year less exercise high HDL patient more concerned would have her cardiologist also discussed risk intervention.  Aortic valve insufficiency,  etiology of cardiac valve disease unspecified  Medication management  Paroxysmal atrial fibrillation (HCC)  Chronic anticoagulation Disc  osteo[prosis  Vit d prolia  , situational sleep disturbance with stress anxiety. Patient Care Team: Burnis Medin, MD as PCP - General Clent Jacks, MD (Ophthalmology) Maia Breslow, MD (Orthopedic Surgery) Minus Breeding, MD as Consulting Physician (Cardiology) Inda Castle, MD as Consulting Physician (Gastroenterology) Patient Instructions  Add 1000 iu vit d 3 to your regimen  Try ti increase  Weight bearing exercise  Will be contacted about  Consultation about osteoporosis  Treatments and management  With Dr Renne Crigler endocrinology.   Continue lifestyle intervention healthy eating and exercise . And have Dr/.  Hochrein also opine on your cholesterol levels.   Consider counseling as planned about the  Anxiety  Tasks effecting sleep .    Health Maintenance, Female Introduction Adopting a healthy lifestyle and getting preventive care can go a long way to promote health and wellness. Talk with your health care provider about what schedule of regular examinations is right for you. This is a good chance for you to check in with your provider about disease prevention and staying healthy. In between checkups, there are plenty of things you can do on your own. Experts have done a lot of research about which lifestyle changes and preventive measures are most likely to keep you healthy. Ask your health care provider for more information. Weight and diet Eat a healthy diet  Be sure to include plenty of vegetables, fruits, low-fat dairy products, and lean protein.  Do not eat a lot of foods high in solid fats, added sugars, or salt.  Get regular exercise. This is one of the most important things you can do for your health.  Most adults should exercise for at least 150 minutes each week. The exercise should increase your heart rate and make you sweat  (moderate-intensity exercise).  Most adults should also do strengthening exercises at least twice a week. This is in addition to the moderate-intensity exercise. Maintain a healthy weight  Body mass index (BMI) is a measurement that can be used to identify possible weight problems. It estimates body fat  based on height and weight. Your health care provider can help determine your BMI and help you achieve or maintain a healthy weight.  For females 94 years of age and older:  A BMI below 18.5 is considered underweight.  A BMI of 18.5 to 24.9 is normal.  A BMI of 25 to 29.9 is considered overweight.  A BMI of 30 and above is considered obese. Watch levels of cholesterol and blood lipids  You should start having your blood tested for lipids and cholesterol at 64 years of age, then have this test every 5 years.  You may need to have your cholesterol levels checked more often if:  Your lipid or cholesterol levels are high.  You are older than 64 years of age.  You are at high risk for heart disease. Cancer screening Lung Cancer  Lung cancer screening is recommended for adults 50-68 years old who are at high risk for lung cancer because of a history of smoking.  A yearly low-dose CT scan of the lungs is recommended for people who:  Currently smoke.  Have quit within the past 15 years.  Have at least a 30-pack-year history of smoking. A pack year is smoking an average of one pack of cigarettes a day for 1 year.  Yearly screening should continue until it has been 15 years since you quit.  Yearly screening should stop if you develop a health problem that would prevent you from having lung cancer treatment. Breast Cancer  Practice breast self-awareness. This means understanding how your breasts normally appear and feel.  It also means doing regular breast self-exams. Let your health care provider know about any changes, no matter how small.  If you are in your 20s or 30s, you  should have a clinical breast exam (CBE) by a health care provider every 1-3 years as part of a regular health exam.  If you are 20 or older, have a CBE every year. Also consider having a breast X-ray (mammogram) every year.  If you have a family history of breast cancer, talk to your health care provider about genetic screening.  If you are at high risk for breast cancer, talk to your health care provider about having an MRI and a mammogram every year.  Breast cancer gene (BRCA) assessment is recommended for women who have family members with BRCA-related cancers. BRCA-related cancers include:  Breast.  Ovarian.  Tubal.  Peritoneal cancers.  Results of the assessment will determine the need for genetic counseling and BRCA1 and BRCA2 testing. Cervical Cancer  Your health care provider may recommend that you be screened regularly for cancer of the pelvic organs (ovaries, uterus, and vagina). This screening involves a pelvic examination, including checking for microscopic changes to the surface of your cervix (Pap test). You may be encouraged to have this screening done every 3 years, beginning at age 75.  For women ages 43-65, health care providers may recommend pelvic exams and Pap testing every 3 years, or they may recommend the Pap and pelvic exam, combined with testing for human papilloma virus (HPV), every 5 years. Some types of HPV increase your risk of cervical cancer. Testing for HPV may also be done on women of any age with unclear Pap test results.  Other health care providers may not recommend any screening for nonpregnant women who are considered low risk for pelvic cancer and who do not have symptoms. Ask your health care provider if a screening pelvic exam is right for you.  If you have had past treatment for cervical cancer or a condition that could lead to cancer, you need Pap tests and screening for cancer for at least 20 years after your treatment. If Pap tests have been  discontinued, your risk factors (such as having a new sexual partner) need to be reassessed to determine if screening should resume. Some women have medical problems that increase the chance of getting cervical cancer. In these cases, your health care provider may recommend more frequent screening and Pap tests. Colorectal Cancer  This type of cancer can be detected and often prevented.  Routine colorectal cancer screening usually begins at 64 years of age and continues through 64 years of age.  Your health care provider may recommend screening at an earlier age if you have risk factors for colon cancer.  Your health care provider may also recommend using home test kits to check for hidden blood in the stool.  A small camera at the end of a tube can be used to examine your colon directly (sigmoidoscopy or colonoscopy). This is done to check for the earliest forms of colorectal cancer.  Routine screening usually begins at age 54.  Direct examination of the colon should be repeated every 5-10 years through 64 years of age. However, you may need to be screened more often if early forms of precancerous polyps or small growths are found. Skin Cancer  Check your skin from head to toe regularly.  Tell your health care provider about any new moles or changes in moles, especially if there is a change in a mole's shape or color.  Also tell your health care provider if you have a mole that is larger than the size of a pencil eraser.  Always use sunscreen. Apply sunscreen liberally and repeatedly throughout the day.  Protect yourself by wearing long sleeves, pants, a wide-brimmed hat, and sunglasses whenever you are outside. Heart disease, diabetes, and high blood pressure  High blood pressure causes heart disease and increases the risk of stroke. High blood pressure is more likely to develop in:  People who have blood pressure in the high end of the normal range (130-139/85-89 mm Hg).  People  who are overweight or obese.  People who are African American.  If you are 63-11 years of age, have your blood pressure checked every 3-5 years. If you are 81 years of age or older, have your blood pressure checked every year. You should have your blood pressure measured twice-once when you are at a hospital or clinic, and once when you are not at a hospital or clinic. Record the average of the two measurements. To check your blood pressure when you are not at a hospital or clinic, you can use:  An automated blood pressure machine at a pharmacy.  A home blood pressure monitor.  If you are between 52 years and 3 years old, ask your health care provider if you should take aspirin to prevent strokes.  Have regular diabetes screenings. This involves taking a blood sample to check your fasting blood sugar level.  If you are at a normal weight and have a low risk for diabetes, have this test once every three years after 64 years of age.  If you are overweight and have a high risk for diabetes, consider being tested at a younger age or more often. Preventing infection Hepatitis B  If you have a higher risk for hepatitis B, you should be screened for this virus. You are considered at  high risk for hepatitis B if:  You were born in a country where hepatitis B is common. Ask your health care provider which countries are considered high risk.  Your parents were born in a high-risk country, and you have not been immunized against hepatitis B (hepatitis B vaccine).  You have HIV or AIDS.  You use needles to inject street drugs.  You live with someone who has hepatitis B.  You have had sex with someone who has hepatitis B.  You get hemodialysis treatment.  You take certain medicines for conditions, including cancer, organ transplantation, and autoimmune conditions. Hepatitis C  Blood testing is recommended for:  Everyone born from [birth year] through 1964.  Anyone with known risk factors for  hepatitis C. Sexually transmitted infections (STIs)  You should be screened for sexually transmitted infections (STIs) including gonorrhea and chlamydia if:  You are sexually active and are younger than 64 years of age.  You are older than 64 years of age and your health care provider tells you that you are at risk for this type of infection.  Your sexual activity has changed since you were last screened and you are at an increased risk for chlamydia or gonorrhea. Ask your health care provider if you are at risk.  If you do not have HIV, but are at risk, it may be recommended that you take a prescription medicine daily to prevent HIV infection. This is called pre-exposure prophylaxis (PrEP). You are considered at risk if:  You are sexually active and do not regularly use condoms or know the HIV status of your partner(s).  You take drugs by injection.  You are sexually active with a partner who has HIV. Talk with your health care provider about whether you are at high risk of being infected with HIV. If you choose to begin PrEP, you should first be tested for HIV. You should then be tested every 3 months for as long as you are taking PrEP. Pregnancy  If you are premenopausal and you may become pregnant, ask your health care provider about preconception counseling.  If you may become pregnant, take 400 to 800 micrograms (mcg) of folic acid every day.  If you want to prevent pregnancy, talk to your health care provider about birth control (contraception). Osteoporosis and menopause  Osteoporosis is a disease in which the bones lose minerals and strength with aging. This can result in serious bone fractures. Your risk for osteoporosis can be identified using a bone density scan.  If you are 17 years of age or older, or if you are at risk for osteoporosis and fractures, ask your health care provider if you should be screened.  Ask your health care provider whether you should take a calcium  or vitamin D supplement to lower your risk for osteoporosis.  Menopause may have certain physical symptoms and risks.  Hormone replacement therapy may reduce some of these symptoms and risks. Talk to your health care provider about whether hormone replacement therapy is right for you. Follow these instructions at home:  Schedule regular health, dental, and eye exams.  Stay current with your immunizations.  Do not use any tobacco products including cigarettes, chewing tobacco, or electronic cigarettes.  If you are pregnant, do not drink alcohol.  If you are breastfeeding, limit how much and how often you drink alcohol.  Limit alcohol intake to no more than 1 drink per day for nonpregnant women. One drink equals 12 ounces of beer, 5 ounces of wine,  or 1 ounces of hard liquor.  Do not use street drugs.  Do not share needles.  Ask your health care provider for help if you need support or information about quitting drugs.  Tell your health care provider if you often feel depressed.  Tell your health care provider if you have ever been abused or do not feel safe at home. This information is not intended to replace advice given to you by your health care provider. Make sure you discuss any questions you have with your health care provider. Document Released: 05/01/2011 Document Revised: 03/23/2016 Document Reviewed: 07/20/2015  2017 [employer]vier      Mariann Laster K. Gervis Gaba M.D.

## 2016-09-19 ENCOUNTER — Encounter: Payer: Self-pay | Admitting: Internal Medicine

## 2016-09-19 ENCOUNTER — Ambulatory Visit (INDEPENDENT_AMBULATORY_CARE_PROVIDER_SITE_OTHER): Payer: BLUE CROSS/BLUE SHIELD | Admitting: Internal Medicine

## 2016-09-19 VITALS — BP 130/62 | Temp 98.1°F | Ht 65.0 in | Wt 108.2 lb

## 2016-09-19 DIAGNOSIS — M810 Age-related osteoporosis without current pathological fracture: Secondary | ICD-10-CM

## 2016-09-19 DIAGNOSIS — E7800 Pure hypercholesterolemia, unspecified: Secondary | ICD-10-CM

## 2016-09-19 DIAGNOSIS — Z0000 Encounter for general adult medical examination without abnormal findings: Secondary | ICD-10-CM

## 2016-09-19 DIAGNOSIS — Z79899 Other long term (current) drug therapy: Secondary | ICD-10-CM

## 2016-09-19 DIAGNOSIS — I351 Nonrheumatic aortic (valve) insufficiency: Secondary | ICD-10-CM

## 2016-09-19 DIAGNOSIS — I480 Paroxysmal atrial fibrillation: Secondary | ICD-10-CM

## 2016-09-19 DIAGNOSIS — Z7901 Long term (current) use of anticoagulants: Secondary | ICD-10-CM

## 2016-09-19 NOTE — Patient Instructions (Addendum)
Add 1000 iu vit d 3 to your regimen  Try ti increase  Weight bearing exercise  Will be contacted about  Consultation about osteoporosis  Treatments and management  With Dr Renne Crigler endocrinology.   Continue lifestyle intervention healthy eating and exercise . And have Dr/.  Hochrein also opine on your cholesterol levels.   Consider counseling as planned about the  Anxiety  Tasks effecting sleep .    Health Maintenance, Female Introduction Adopting a healthy lifestyle and getting preventive care can go a long way to promote health and wellness. Talk with your health care provider about what schedule of regular examinations is right for you. This is a good chance for you to check in with your provider about disease prevention and staying healthy. In between checkups, there are plenty of things you can do on your own. Experts have done a lot of research about which lifestyle changes and preventive measures are most likely to keep you healthy. Ask your health care provider for more information. Weight and diet Eat a healthy diet  Be sure to include plenty of vegetables, fruits, low-fat dairy products, and lean protein.  Do not eat a lot of foods high in solid fats, added sugars, or salt.  Get regular exercise. This is one of the most important things you can do for your health.  Most adults should exercise for at least 150 minutes each week. The exercise should increase your heart rate and make you sweat (moderate-intensity exercise).  Most adults should also do strengthening exercises at least twice a week. This is in addition to the moderate-intensity exercise. Maintain a healthy weight  Body mass index (BMI) is a measurement that can be used to identify possible weight problems. It estimates body fat based on height and weight. Your health care provider can help determine your BMI and help you achieve or maintain a healthy weight.  For females 75 years of age and older:  A BMI below 18.5  is considered underweight.  A BMI of 18.5 to 24.9 is normal.  A BMI of 25 to 29.9 is considered overweight.  A BMI of 30 and above is considered obese. Watch levels of cholesterol and blood lipids  You should start having your blood tested for lipids and cholesterol at 64 years of age, then have this test every 5 years.  You may need to have your cholesterol levels checked more often if:  Your lipid or cholesterol levels are high.  You are older than 64 years of age.  You are at high risk for heart disease. Cancer screening Lung Cancer  Lung cancer screening is recommended for adults 40-50 years old who are at high risk for lung cancer because of a history of smoking.  A yearly low-dose CT scan of the lungs is recommended for people who:  Currently smoke.  Have quit within the past 15 years.  Have at least a 30-pack-year history of smoking. A pack year is smoking an average of one pack of cigarettes a day for 1 year.  Yearly screening should continue until it has been 15 years since you quit.  Yearly screening should stop if you develop a health problem that would prevent you from having lung cancer treatment. Breast Cancer  Practice breast self-awareness. This means understanding how your breasts normally appear and feel.  It also means doing regular breast self-exams. Let your health care provider know about any changes, no matter how small.  If you are in your 43s or  91s, you should have a clinical breast exam (CBE) by a health care provider every 1-3 years as part of a regular health exam.  If you are 53 or older, have a CBE every year. Also consider having a breast X-ray (mammogram) every year.  If you have a family history of breast cancer, talk to your health care provider about genetic screening.  If you are at high risk for breast cancer, talk to your health care provider about having an MRI and a mammogram every year.  Breast cancer gene (BRCA) assessment is  recommended for women who have family members with BRCA-related cancers. BRCA-related cancers include:  Breast.  Ovarian.  Tubal.  Peritoneal cancers.  Results of the assessment will determine the need for genetic counseling and BRCA1 and BRCA2 testing. Cervical Cancer  Your health care provider may recommend that you be screened regularly for cancer of the pelvic organs (ovaries, uterus, and vagina). This screening involves a pelvic examination, including checking for microscopic changes to the surface of your cervix (Pap test). You may be encouraged to have this screening done every 3 years, beginning at age 6.  For women ages 68-65, health care providers may recommend pelvic exams and Pap testing every 3 years, or they may recommend the Pap and pelvic exam, combined with testing for human papilloma virus (HPV), every 5 years. Some types of HPV increase your risk of cervical cancer. Testing for HPV may also be done on women of any age with unclear Pap test results.  Other health care providers may not recommend any screening for nonpregnant women who are considered low risk for pelvic cancer and who do not have symptoms. Ask your health care provider if a screening pelvic exam is right for you.  If you have had past treatment for cervical cancer or a condition that could lead to cancer, you need Pap tests and screening for cancer for at least 20 years after your treatment. If Pap tests have been discontinued, your risk factors (such as having a new sexual partner) need to be reassessed to determine if screening should resume. Some women have medical problems that increase the chance of getting cervical cancer. In these cases, your health care provider may recommend more frequent screening and Pap tests. Colorectal Cancer  This type of cancer can be detected and often prevented.  Routine colorectal cancer screening usually begins at 64 years of age and continues through 64 years of  age.  Your health care provider may recommend screening at an earlier age if you have risk factors for colon cancer.  Your health care provider may also recommend using home test kits to check for hidden blood in the stool.  A small camera at the end of a tube can be used to examine your colon directly (sigmoidoscopy or colonoscopy). This is done to check for the earliest forms of colorectal cancer.  Routine screening usually begins at age 21.  Direct examination of the colon should be repeated every 5-10 years through 64 years of age. However, you may need to be screened more often if early forms of precancerous polyps or small growths are found. Skin Cancer  Check your skin from head to toe regularly.  Tell your health care provider about any new moles or changes in moles, especially if there is a change in a mole's shape or color.  Also tell your health care provider if you have a mole that is larger than the size of a pencil eraser.  Always use sunscreen. Apply sunscreen liberally and repeatedly throughout the day.  Protect yourself by wearing long sleeves, pants, a wide-brimmed hat, and sunglasses whenever you are outside. Heart disease, diabetes, and high blood pressure  High blood pressure causes heart disease and increases the risk of stroke. High blood pressure is more likely to develop in:  People who have blood pressure in the high end of the normal range (130-139/85-89 mm Hg).  People who are overweight or obese.  People who are African American.  If you are 47-64 years of age, have your blood pressure checked every 3-5 years. If you are 70 years of age or older, have your blood pressure checked every year. You should have your blood pressure measured twice-once when you are at a hospital or clinic, and once when you are not at a hospital or clinic. Record the average of the two measurements. To check your blood pressure when you are not at a hospital or clinic, you can  use:  An automated blood pressure machine at a pharmacy.  A home blood pressure monitor.  If you are between 64 years and 48 years old, ask your health care provider if you should take aspirin to prevent strokes.  Have regular diabetes screenings. This involves taking a blood sample to check your fasting blood sugar level.  If you are at a normal weight and have a low risk for diabetes, have this test once every three years after 64 years of age.  If you are overweight and have a high risk for diabetes, consider being tested at a younger age or more often. Preventing infection Hepatitis B  If you have a higher risk for hepatitis B, you should be screened for this virus. You are considered at high risk for hepatitis B if:  You were born in a country where hepatitis B is common. Ask your health care provider which countries are considered high risk.  Your parents were born in a high-risk country, and you have not been immunized against hepatitis B (hepatitis B vaccine).  You have HIV or AIDS.  You use needles to inject street drugs.  You live with someone who has hepatitis B.  You have had sex with someone who has hepatitis B.  You get hemodialysis treatment.  You take certain medicines for conditions, including cancer, organ transplantation, and autoimmune conditions. Hepatitis C  Blood testing is recommended for:  Everyone born from [birth year] through 1964.  Anyone with known risk factors for hepatitis C. Sexually transmitted infections (STIs)  You should be screened for sexually transmitted infections (STIs) including gonorrhea and chlamydia if:  You are sexually active and are younger than 64 years of age.  You are older than 64 years of age and your health care provider tells you that you are at risk for this type of infection.  Your sexual activity has changed since you were last screened and you are at an increased risk for chlamydia or gonorrhea. Ask your health care  provider if you are at risk.  If you do not have HIV, but are at risk, it may be recommended that you take a prescription medicine daily to prevent HIV infection. This is called pre-exposure prophylaxis (PrEP). You are considered at risk if:  You are sexually active and do not regularly use condoms or know the HIV status of your partner(s).  You take drugs by injection.  You are sexually active with a partner who has HIV. Talk with your health care provider  about whether you are at high risk of being infected with HIV. If you choose to begin PrEP, you should first be tested for HIV. You should then be tested every 3 months for as long as you are taking PrEP. Pregnancy  If you are premenopausal and you may become pregnant, ask your health care provider about preconception counseling.  If you may become pregnant, take 400 to 800 micrograms (mcg) of folic acid every day.  If you want to prevent pregnancy, talk to your health care provider about birth control (contraception). Osteoporosis and menopause  Osteoporosis is a disease in which the bones lose minerals and strength with aging. This can result in serious bone fractures. Your risk for osteoporosis can be identified using a bone density scan.  If you are 69 years of age or older, or if you are at risk for osteoporosis and fractures, ask your health care provider if you should be screened.  Ask your health care provider whether you should take a calcium or vitamin D supplement to lower your risk for osteoporosis.  Menopause may have certain physical symptoms and risks.  Hormone replacement therapy may reduce some of these symptoms and risks. Talk to your health care provider about whether hormone replacement therapy is right for you. Follow these instructions at home:  Schedule regular health, dental, and eye exams.  Stay current with your immunizations.  Do not use any tobacco products including cigarettes, chewing tobacco, or  electronic cigarettes.  If you are pregnant, do not drink alcohol.  If you are breastfeeding, limit how much and how often you drink alcohol.  Limit alcohol intake to no more than 1 drink per day for nonpregnant women. One drink equals 12 ounces of beer, 5 ounces of wine, or 1 ounces of hard liquor.  Do not use street drugs.  Do not share needles.  Ask your health care provider for help if you need support or information about quitting drugs.  Tell your health care provider if you often feel depressed.  Tell your health care provider if you have ever been abused or do not feel safe at home. This information is not intended to replace advice given to you by your health care provider. Make sure you discuss any questions you have with your health care provider. Document Released: 05/01/2011 Document Revised: 03/23/2016 Document Reviewed: 07/20/2015  2017 [employer]

## 2016-09-20 ENCOUNTER — Encounter: Payer: Self-pay | Admitting: Cardiology

## 2016-09-20 NOTE — Progress Notes (Signed)
I don't know if the note went through.  There is still no indication to treat with statin.  You could suggest a coronary calcium score.  If that was very elevated I might suggest treatment.  [name] [name]

## 2016-09-20 NOTE — Progress Notes (Signed)
Hi,  There is still no indication for meds.  Could suggest a coronary calcium score.  If that was high I might suggest a statin.  Her HDL is excellent so the ratio is pretty good.  Maylon Cos

## 2016-09-27 NOTE — Progress Notes (Signed)
HPI The patient presents for follow up of palpitations and aortic insufficiency. She wore an event monitor which demonstrated paroxysmal atrial fibrillation. Since I last saw her she had an echo and had mild to moderate AI/MR.  She has had some palpitations with occasional irregular rhythms lasting up to 6 hours but she tolerates these have not increased in frequency or intensity. She tolerates anticoagulation. He now has osteoporosis and is worried about this. Her blood pressures have been a little office but she has noticed that necessarily at home she's not as good about keeping a readings as we have discussed in the past. She's not having any chest pressure, neck or arm discomfort. She's not having any shortness of breath, PND or orthopnea.   Allergies  Allergen Reactions  . Codeine Phosphate     drowsy  . Demerol [Meperidine]   . Meperidine Hcl Nausea Only    Current Outpatient Prescriptions  Medication Sig Dispense Refill  . acetaminophen (TYLENOL) 500 MG tablet Take 500 mg by mouth as needed.    [name] apixaban (ELIQUIS) 5 MG TABS tablet Take 1 tablet (5 mg total) by mouth 2 (two) times daily. 60 tablet 0  . Calcium Carbonate-Vitamin D (CALCIUM PLUS VITAMIN D PO) Take 2 tablets by mouth daily.     [name] diltiazem (CARTIA XT) 300 MG 24 hr capsule Take 1 capsule (300 mg total) by mouth daily. 90 capsule 3  . Eyelid Cleansers (AVENOVA) 0.01 % SOLN Apply topically daily. Pt wipes the eyelid once daily    . ibuprofen (ADVIL,MOTRIN) 800 MG tablet Take 800 mg by mouth 2 (two) times daily as needed for moderate pain.     [name] LORazepam (ATIVAN) 0.5 MG tablet Take 1 tablet (0.5 mg total) by mouth 3 (three) times daily as needed for anxiety. 20 tablet 0  . montelukast (SINGULAIR) 10 MG tablet TAKE 1 TABLET BY MOUTH DAILY 90 tablet 1  . Multiple Vitamins-Minerals (PRESERVISION AREDS PO) Take 1 tablet by mouth 2 (two) times daily.     . propranolol (INDERAL) 10 MG tablet Take 1 tablet (10 mg total) by  mouth every 4 (four) hours as needed. 120 tablet 6   No current facility-administered medications for this visit.     Past Medical History:  Diagnosis Date  . A-fib ([place])   . Allergic rhinitis   . Aortic insufficiency   . CAT SCRATCH 09/10/2009   Qualifier: Diagnosis of  By: Regis Bill MD, [name]   . CHEST WALL PAIN, ACUTE 07/21/2010   Qualifier: Diagnosis of  By: Sarajane Jews MD, Ishmael Holter   . Clavicle fracture 3/08   left  . Diverticulosis   . Heart palpitations   . HTN (hypertension)   . Osteopenia   . Vision disturbance 03/13/2013   Negative neuro MRA MRI    Past Surgical History:  Procedure Laterality Date  . ABDOMINAL HYSTERECTOMY     pt denies 04/15/14  . APPENDECTOMY    . LAPAROSCOPIC SALPINGO OOPHERECTOMY Right   . TONSILLECTOMY AND ADENOIDECTOMY      ROS:  As stated in the HPI and negative for all other systems.  PHYSICAL EXAM There were no vitals taken for this visit. GENERAL:  Well appearing NECK:  No jugular venous distention, waveform within normal limits, carotid upstroke brisk and symmetric, no bruits, no thyromegaly LUNGS:  Clear to auscultation bilaterally CHEST:  Unremarkable HEART:  PMI not displaced or sustained,S1 and S2 within normal limits, no S3, no S4, no clicks, no rubs, diastolic  murmur mid to late at the left 3rd intercostal space. ABD:  Flat, positive bowel sounds normal in frequency in pitch, no bruits, no rebound, no guarding, no midline pulsatile mass, no hepatomegaly, no splenomegaly EXT:  2 plus pulses throughout, no edema, no cyanosis no clubbing  EKG:  Sinus rhythm, rate 66, axis within normal limits, intervals within normal limits, left atrial enlargement, poor anterior R wave progression, no acute ST-T wave changes.  ASSESSMENT AND PLAN  Aortic insufficiency -  She had mild to moderate MR/AI last month and I will follow this clinically. I will see her in one year and likely can wait to order another echo.    Atrial fib-  Ms. Lenny D Dews has  a CHA2DS2 - VASc score of 2. She will continue Eliquis.  For now she will continue with rate control and increased Cardizem.  Her palpitation frequency and intensity is not changed.   HTN - The blood pressure is at target (barely). No change in medications is indicated. We will continue with therapeutic lifestyle changes (TLC).  She will keep a BP diary.

## 2016-09-28 ENCOUNTER — Ambulatory Visit (INDEPENDENT_AMBULATORY_CARE_PROVIDER_SITE_OTHER): Payer: BLUE CROSS/BLUE SHIELD | Admitting: Cardiology

## 2016-09-28 VITALS — BP 118/64 | HR 66 | Ht 65.0 in | Wt 107.4 lb

## 2016-09-28 DIAGNOSIS — I351 Nonrheumatic aortic (valve) insufficiency: Secondary | ICD-10-CM

## 2016-09-28 DIAGNOSIS — I480 Paroxysmal atrial fibrillation: Secondary | ICD-10-CM | POA: Diagnosis not present

## 2016-09-28 NOTE — Patient Instructions (Signed)

## 2016-09-29 ENCOUNTER — Encounter: Payer: Self-pay | Admitting: Cardiology

## 2016-09-30 ENCOUNTER — Other Ambulatory Visit: Payer: Self-pay | Admitting: Cardiology

## 2016-10-04 ENCOUNTER — Encounter: Payer: Self-pay | Admitting: Family Medicine

## 2016-10-04 ENCOUNTER — Ambulatory Visit (INDEPENDENT_AMBULATORY_CARE_PROVIDER_SITE_OTHER): Payer: BLUE CROSS/BLUE SHIELD | Admitting: Family Medicine

## 2016-10-04 VITALS — BP 140/80 | HR 87 | Temp 98.2°F | Ht 65.0 in | Wt 107.5 lb

## 2016-10-04 DIAGNOSIS — R350 Frequency of micturition: Secondary | ICD-10-CM

## 2016-10-04 DIAGNOSIS — N3090 Cystitis, unspecified without hematuria: Secondary | ICD-10-CM

## 2016-10-04 LAB — POCT URINALYSIS DIPSTICK
Bilirubin, UA: NEGATIVE
Blood, UA: 1
GLUCOSE UA: NEGATIVE
Ketones, UA: NEGATIVE
Nitrite, UA: NEGATIVE
PROTEIN UA: NEGATIVE
Spec Grav, UA: >=1.03
UROBILINOGEN UA: 0.2
pH, UA: 6

## 2016-10-04 MED ORDER — SULFAMETHOXAZOLE-TRIMETHOPRIM 800-160 MG PO TABS: 1.0000 | ORAL_TABLET | Freq: Two times a day (BID) | ORAL | 1 refills | Status: DC

## 2016-10-04 NOTE — Progress Notes (Signed)
[name] [name] is a 65 year old married female nonsmoker who comes in with a four-day history of urinary tract symptoms  Sheand Saturday some frequency and some pressure. They got worse yesterday. She's had no fever chills or back pain. Her last unit tract infection was 10-20 years ago.  She's on a request and Cardizem and Inderal because of A. fib. She also takes Singulair for allergic rhinitis  Vital signs stable she's afebrile  Examination of the abdomen was negative  Urinalysis shows 1+ white cells 1+ blood  Urinary tract infection............... Septra DS twice a day for 10 days.

## 2016-10-04 NOTE — Patient Instructions (Signed)
Drink lots of water  Septra DS........ one twice daily for 10 days may repeat 1

## 2016-10-09 ENCOUNTER — Telehealth: Payer: Self-pay | Admitting: Family Medicine

## 2016-10-09 ENCOUNTER — Encounter: Payer: Self-pay | Admitting: Family Medicine

## 2016-10-09 ENCOUNTER — Ambulatory Visit (INDEPENDENT_AMBULATORY_CARE_PROVIDER_SITE_OTHER): Payer: BLUE CROSS/BLUE SHIELD | Admitting: Family Medicine

## 2016-10-09 VITALS — BP 126/78 | HR 80 | Temp 98.7°F | Wt 107.0 lb

## 2016-10-09 DIAGNOSIS — L270 Generalized skin eruption due to drugs and medicaments taken internally: Secondary | ICD-10-CM

## 2016-10-09 DIAGNOSIS — N3090 Cystitis, unspecified without hematuria: Secondary | ICD-10-CM

## 2016-10-09 MED ORDER — CEPHALEXIN 500 MG PO CAPS: 500.0000 mg | ORAL_CAPSULE | Freq: Two times a day (BID) | ORAL | 0 refills | Status: DC

## 2016-10-09 NOTE — Progress Notes (Signed)
Subjective:    Patient ID: [name] [name], female    DOB: 06-14-1951, 65 y.o.   MRN: [id number]  HPI  [name] [name] is a 65 year old female who presents today with a scattered, red rash on her chest, back and legs that started after 3 to 4 doses of Septra which was started 10/03/16.  Associated symptom of pruritis occurred when rash appeared but has improved after stopping this medication. She denies swelling of tongue/lips, difficulty breathing, SOB, chest pain, palpitations, wheezing,  She was treated on 10/04/16 for urinary tract symptoms with Septra DS and she stopped this medication after calling office and speaking with a nurse about new rash on 10/09/16. Today, she reports continued urinary frequency and urgency.  She denies flank pain, hematuria, fever, chills, sweats. Treatment at home with AZO after stopping Septra which has provided moderate benefit.  Review of Systems  Constitutional: Negative for chills, fatigue and fever.  HENT: Negative for rhinorrhea and trouble swallowing.   Respiratory: Negative for cough, shortness of breath and wheezing.   Cardiovascular: Negative for chest pain and palpitations.  Gastrointestinal: Negative for abdominal pain, diarrhea, nausea and vomiting.  Genitourinary: Positive for frequency and urgency. Negative for flank pain, hematuria and vaginal discharge.  Musculoskeletal: Negative for myalgias.  Neurological: Negative for dizziness, light-headedness and headaches.   Past Medical History:  Diagnosis Date  . A-fib ([place])   . Allergic rhinitis   . Aortic insufficiency   . CAT SCRATCH 09/10/2009   Qualifier: Diagnosis of  By: Regis Bill MD, [name]   . CHEST WALL PAIN, ACUTE 07/21/2010   Qualifier: Diagnosis of  By: Sarajane Jews MD, Ishmael Holter   . Clavicle fracture 3/08   left  . Diverticulosis   . Heart palpitations   . HTN (hypertension)   . Osteopenia   . Vision disturbance 03/13/2013   Negative neuro MRA MRI     Social History   Social History  .  Marital status: Married    Spouse name: N/A  . Number of children: 0  . Years of education: N/A   Occupational History  . 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 Smoking status: Never Smoker  . Smokeless tobacco: Never Used  . Alcohol use No     Comment: never  . Drug use: No  . Sexual activity: Not on file   Other Topics Concern  . Not on file   Social History Narrative   Married   hh of 2    p0g0   Works for [employer]     Past Surgical History:  Procedure Laterality Date  . ABDOMINAL HYSTERECTOMY     pt denies 04/15/14  . APPENDECTOMY    . LAPAROSCOPIC SALPINGO OOPHERECTOMY Right   . TONSILLECTOMY AND ADENOIDECTOMY      Family History  Problem Relation Age of Onset  . Scleroderma Mother   . Hyperlipidemia Mother   . Coronary artery disease Father 65  . Esophageal cancer Paternal Grandmother     Allergies  Allergen Reactions  . Codeine Phosphate     drowsy  . Demerol [Meperidine]   . Meperidine Hcl Nausea Only  . Sulfa Antibiotics     Rash    Current Outpatient Prescriptions on File Prior to Visit  Medication Sig Dispense Refill  . acetaminophen (TYLENOL) 500 MG tablet Take 500 mg by mouth as needed.    . Calcium Carbonate-Vitamin D (CALCIUM PLUS VITAMIN D  PO) Take 2 tablets by mouth daily.     . Cholecalciferol (VITAMIN D3) 1000 units CAPS Take 1 capsule by mouth daily.    [name] diltiazem (CARTIA XT) 300 MG 24 hr capsule Take 1 capsule (300 mg total) by mouth daily. 90 capsule 3  . ELIQUIS 5 MG TABS tablet TAKE 1 TABLET BY MOUTH TWICE DAILY 180 tablet 1  . Eyelid Cleansers (AVENOVA) 0.01 % SOLN Apply topically daily. Pt wipes the eyelid once daily    . ibuprofen (ADVIL,MOTRIN) 800 MG tablet Take 800 mg by mouth 2 (two) times daily as needed for moderate pain.     [name] LORazepam (ATIVAN) 0.5 MG tablet Take 1 tablet (0.5 mg total) by mouth 3 (three) times daily as needed for anxiety. 20 tablet 0  . montelukast  (SINGULAIR) 10 MG tablet TAKE 1 TABLET BY MOUTH DAILY 90 tablet 1  . Multiple Vitamins-Minerals (PRESERVISION AREDS PO) Take 1 tablet by mouth 2 (two) times daily.     . propranolol (INDERAL) 10 MG tablet Take 1 tablet (10 mg total) by mouth every 4 (four) hours as needed. 120 tablet 6   No current facility-administered medications on file prior to visit.     BP 126/78 (BP Location: Left Arm, Patient Position: Sitting, Cuff Size: Normal)   Pulse 80   Temp 98.7 F (37.1 C) (Oral)   Wt 107 lb (48.5 kg)   SpO2 97%   BMI 17.81 kg/m        Objective:   Physical Exam  Constitutional: She is oriented to person, place, and time.  Thin, optimally nourished   Eyes: Pupils are equal, round, and reactive to light. No scleral icterus.  Neck: Neck supple.  Cardiovascular: Normal rate and regular rhythm.   Pulmonary/Chest: Effort normal and breath sounds normal. She has no wheezes. She has no rales.  Abdominal: Soft. Bowel sounds are normal. There is no tenderness. There is no rebound and no CVA tenderness.  Lymphadenopathy:    She has no cervical adenopathy.  Neurological: She is alert and oriented to person, place, and time.  Skin: Skin is warm and dry. Rash noted.  Scattered erythematous macules and papules noted on chest, abdomen, and back. No evidence of rash on extremities or face  Psychiatric: She has a normal mood and affect. Her behavior is normal. Judgment and thought content normal.       Assessment & Plan:  1. Allergic drug rash Suspect that rash is related to first time use of Septra DS. As rash is improving per patient report with no pruritis noted today, will advise treatment with Allegra, Claritin, or Zyrtec for symptoms for one week. Will not provide prednisone at this time as exam is reassuring and patient has a history of osteoporosis. Provided written information regarding drug rash and signs/symptoms that require immediate medical attention. Also, recommended use of  hypoallergenic detergents, soaps, and lotions.  2. Cystitis Change in antibiotic therapy for cystitis as patient was not able to complete dose related to drug reaction. Advised follow up if urinary symptoms do not improve with treatment, worsen, or she develops fever or back pain. - cephALEXin (KEFLEX) 500 MG capsule; Take 1 capsule (500 mg total) by mouth 2 (two) times daily.  Dispense: 10 capsule; Refill: 0  Delano Metz, FNP-C

## 2016-10-09 NOTE — Telephone Encounter (Signed)
FYI, pt has appt today at [facility] - Client [place] Patient Name: [name] [name] Gender: Female DOB: 24-Aug-1951 Age: 65 Y 11 M 23 D Return Phone Number: [phone] (Primary), [phone] (Secondary) Address: City/State/Zip: [place] Client [facility] Client Site [facility] - Night Physician Shanon Ace - MD Contact Type Call Who Is Calling Patient / Member / Family / Caregiver Call Type Triage / Clinical Relationship To Patient Self Return Phone Number [phone] (Primary) Chief Complaint Medication reaction Reason for Call Symptomatic / Request for [place] was prescribed sulfa drug for UTI last week, now she has a rash on stomach and chest. PreDisposition Call Doctor Translation No Nurse Assessment Nurse: Brock Bad, RN, Barnetta Chapel Date/Time (Eastern Time): 10/08/2016 7:59:52 PM Confirm and document reason for call. If symptomatic, describe symptoms. ---Caller states that she started a new prescription sulfamethoxazole-tmp antibiotic on Wednesday for a UTI. Also takin AZO. The rash is flat pin-point sized red dots that are all over her stomach, chest, and part of her back. It does not itch. She first noticed the rash on Friday morning. It has spread since Friday. Does the patient have any new or worsening symptoms? ---Yes Will a triage be completed? ---Yes Related visit to physician within the last 2 weeks? ---Yes Does the PT have any chronic conditions? (i.e. diabetes, asthma, etc.) ---Yes List chronic conditions. ---Hypertension, afib, allergies Is this a behavioral health or substance abuse call? ---No Guidelines Guideline Title Affirmed Question Affirmed Notes Nurse Date/Time (Eastern Time) Rash - Widespread On Drugs Taking new prescription antibiotic (EXCEPTION: finished taking  new prescription antibiotic) Brock Bad, RN, Barnetta Chapel 10/08/2016 8:07:24 PM Disp. Time Eilene Ghazi Time) Disposition Final User 10/08/2016 8:14:18 PM See Physician within 24 Hours Yes Brock Bad, RN, Barnetta Chapel PLEASE NOTE: All timestamps contained within this report are represented as [place] Standard Time. CONFIDENTIALTY NOTICE: This fax transmission is intended only for the addressee. It contains information that is legally privileged, confidential or otherwise protected from use or disclosure. If you are not the intended recipient, you are strictly prohibited from reviewing, disclosing, copying using or disseminating any of this information or taking any action in reliance on or regarding this information. If you have received this fax in error, please notify us immediately by telephone so that we can arrange for its return to [place]. Phone: [phone], Toll-Free: [phone], Fax: [phone] Page: 2 of 2 Call Id: [id number] Caller Understands: Yes Disagree/Comply: Comply Care Advice Given Per Guideline SEE PHYSICIAN WITHIN 24 HOURS: * IF OFFICE WILL BE OPEN: You need to be seen within the next 24 hours. Call your doctor when the office opens, and make an appointment. STOP THE MEDICATION: Stop the medicine until examined. ANTIHISTAMINE FOR HIVES OR SEVERE ITCHING: COOL BATH FOR ITCHING: * For flare-ups of itching, you can try taking a cool bath for 10 minutes. Caution: avoid any chill. Optional: add 2 oz of baking soda to the tub. * You can also rub very itchy areas with an ice cube for 10 minutes. OATMEAL AVEENO BATH FOR ITCHING: * Sprinkle contents of one Aveeno packet under running faucet with comfortably warm water. Bathe for 15 - 20 minutes, 1-2 times daily. * Pat dry using towel; do not rub. CALL BACK IF: * You become worse. CARE ADVICE given per Rash - Widespread on Drugs (Adult) guideline After Care Instructions Given Call Event Type User Date / Time Description  Education document email  Brock Bad, RN, Barnetta Chapel 10/08/2016 8:14:46 PM [facility] Instructions Referrals REFERRED TO PCP OFFICE

## 2016-10-09 NOTE — Patient Instructions (Addendum)
You can take either Allegra, Claritin, or Zyrtec. Allegra should be taken in the morning or Zyrtec at night if you choose one of these medications.  Use these medications for one week or less if rash is clearing.   Drug Rash Introduction A drug rash is a change in the color or texture of the skin that is caused by a drug. It can develop minutes, hours, or days after the person takes the drug. What are the causes? This condition is usually caused by a drug allergy. It can also be caused by exposure to sunlight after taking a drug that makes the skin sensitive to light. Drugs that commonly cause rashes include:  Penicillin.  Antibiotic medicines.  Medicines that treat seizures.  Medicines that treat cancer (chemotherapy).  Aspirin and other nonsteroidal anti-inflammatory drugs (NSAIDs).  Injectable dyes that contain iodine.  Insulin. What are the signs or symptoms? Symptoms of this condition include:  Redness.  Tiny bumps.  Peeling.  Itching.  Itchy welts (hives).  Swelling. The rash may appear on a small area of skin or all over the body. How is this diagnosed? To diagnose the condition, your health care provider will do a physical exam. He or she may also order tests to find out which drug caused the rash. Tests to find the cause of a rash include:  Skin tests.  Blood tests.  Drug challenge. For this test, you stop taking all of the drugs that you do not need to take, and then you start taking them again by adding back one of the drugs at a time. How is this treated? A drug rash may be treated with medicines, including:  Antihistamines. These may be given to relieve itching.  An NSAID. This may be given to reduce swelling and treat pain.  A steroid drug. This may be given to reduce swelling. The rash usually goes away when the person stops taking the drug that caused it. Follow these instructions at home:  Take medicines only as directed by your health care  provider.  Let all of your health care providers know about any drug reactions you have had in the past.  If you have hives, take a cool shower or use a cool compress to relieve itchiness. Contact a health care provider if:  You have a fever.  Your rash is not going away.  Your rash gets worse.  Your rash comes back.  You have wheezing or coughing. Get help right away if:  You start to have breathing problems.  You start to have shortness of breath.  You face or throat starts to swell.  You have severe weakness with dizziness or fainting.  You have chest pain. This information is not intended to replace advice given to you by your health care provider. Make sure you discuss any questions you have with your health care provider. Document Released: 11/23/2004 Document Revised: 03/23/2016 Document Reviewed: 08/12/2014  2017 Elsevier

## 2016-10-09 NOTE — Progress Notes (Signed)
Pre visit review using our clinic review tool, if applicable. No additional management support is needed unless otherwise documented below in the visit note. 

## 2016-10-18 NOTE — Progress Notes (Signed)
Patient ID: [name] [name], female   DOB: [birth year], 65 y.o.   MRN: [id number]    HPI  [name] [name] is a 65 y.o.-year-old female, referred by her PCP, Dr. Regis Bill, for management of osteoporosis.  Pt was dx with OP in 2017.  I reviewed pt's DEXA scans: Date L1-L4 T score FN T score FRAX   07/27/2016  -1.8 (-3.4%*) RFN: -2.6 (-10.2%*) LFN: -2.4   09/30/2010  -1.6 RFN: -2.0 LFN: -1.8 10 year MOF: 11.9%  10 year hip fracture: 2.2%    + fractures:  1. collarbone - ~2010 - fell in a parking lot 2. L foot fx and sprained ankle - few years ago - tripped and fell 3. R foot fx and sprained ankle - 10/2015  No dizziness/vertigo/orthostasis/poor vision.  Previous OP treatments:  - Fosamax for 5 years  - up to 2011  + h/o vitamin D deficiency. Reviewed available vit D levels: Lab Results  Component Value Date   VD25OH 28.88 (L) 09/14/2016   Pt is on calcium (Ca-D 1200 mg) and vitamin D (started 08/2016: 1000 units daily + from Ca-D) . She also eats dairy and green, leafy, vegetables.   + walking in the neighborhood in the summer; in winter: 1-2x a week aerobic class (stregth).  She does not take high vitamin A doses.  Menopause was in late forties. She was on HRT.  Pt does have a FH of osteoporosis in mother - kyphosis. No fractures.  No h/o hyper/hypocalcemia or hyperparathyroidism. No h/o kidney stones. Lab Results  Component Value Date   PTH 59 09/14/2016   CALCIUM 9.6 09/14/2016   CALCIUM 9.5 10/15/2014   CALCIUM 9.5 04/14/2014   CALCIUM 9.4 04/13/2014   CALCIUM 9.4 03/13/2013   CALCIUM 9.4 12/08/2011   CALCIUM 9.4 09/03/2009   CALCIUM 9.8 03/05/2007   No h/o thyrotoxicosis. Reviewed TSH recent levels:  Lab Results  Component Value Date   TSH 1.22 09/14/2016   TSH 1.065 08/25/2015   TSH 1.04 10/15/2014   TSH 0.53 12/08/2011   TSH 1.15 09/03/2009   No h/o CKD. Last BUN/Cr: Lab Results  Component Value Date   BUN 16 09/14/2016   CREATININE 0.69 09/14/2016     ROS: Constitutional: no weight gain/loss, no fatigue, no subjective hyperthermia/hypothermia, + nocturia, + poor sleep Eyes: no blurry vision, no xerophthalmia ENT: no sore throat, no nodules palpated in throat, no dysphagia/odynophagia, no hoarseness Cardiovascular: no CP/SOB/palpitations/leg swelling Respiratory: no cough/SOB Gastrointestinal: no N/V/D/C Musculoskeletal: no muscle/joint aches Skin: no rashes Neurological: no tremors/numbness/tingling/dizziness Psychiatric: no depression/anxiety  Past Medical History:  Diagnosis Date  . A-fib ([place])   . Allergic rhinitis   . Aortic insufficiency   . CAT SCRATCH 09/10/2009   Qualifier: Diagnosis of  By: Regis Bill MD, [name]   . CHEST WALL PAIN, ACUTE 07/21/2010   Qualifier: Diagnosis of  By: Sarajane Jews MD, Ishmael Holter   . Clavicle fracture 3/08   left  . Diverticulosis   . Heart palpitations   . HTN (hypertension)   . Osteopenia   . Vision disturbance 03/13/2013   Negative neuro MRA MRI   Past Surgical History:  Procedure Laterality Date  . ABDOMINAL HYSTERECTOMY     pt denies 04/15/14  . APPENDECTOMY    . LAPAROSCOPIC SALPINGO OOPHERECTOMY Right   . TONSILLECTOMY AND ADENOIDECTOMY     Social History   Social History  . Marital status: Married    Spouse name: N/A  . Number of children: 0 - had  infertility tx's in her 69s   Occupational History  . 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 Smoking status: Never Smoker  . Smokeless tobacco: Never Used  . Alcohol use No     Comment: never  . Drug use: No   Social History Narrative   Married   hh of 2    p0g0   Works for [employer] and record    Current Outpatient Prescriptions on File Prior to Visit  Medication Sig Dispense Refill  . acetaminophen (TYLENOL) 500 MG tablet Take 500 mg by mouth as needed.    . Calcium Carbonate-Vitamin D (CALCIUM PLUS VITAMIN D PO) Take 2 tablets by mouth daily.     . Cholecalciferol (VITAMIN D3) 1000  units CAPS Take 1 capsule by mouth daily.    [name] diltiazem (CARTIA XT) 300 MG 24 hr capsule Take 1 capsule (300 mg total) by mouth daily. 90 capsule 3  . ELIQUIS 5 MG TABS tablet TAKE 1 TABLET BY MOUTH TWICE DAILY 180 tablet 1  . Eyelid Cleansers (AVENOVA) 0.01 % SOLN Apply topically daily. Pt wipes the eyelid once daily    . ibuprofen (ADVIL,MOTRIN) 800 MG tablet Take 800 mg by mouth 2 (two) times daily as needed for moderate pain.     [name] LORazepam (ATIVAN) 0.5 MG tablet Take 1 tablet (0.5 mg total) by mouth 3 (three) times daily as needed for anxiety. 20 tablet 0  . montelukast (SINGULAIR) 10 MG tablet TAKE 1 TABLET BY MOUTH DAILY 90 tablet 1  . Multiple Vitamins-Minerals (PRESERVISION AREDS PO) Take 1 tablet by mouth 2 (two) times daily.     . propranolol (INDERAL) 10 MG tablet Take 1 tablet (10 mg total) by mouth every 4 (four) hours as needed. 120 tablet 6   No current facility-administered medications on file prior to visit.    Allergies  Allergen Reactions  . Codeine Phosphate     drowsy  . Demerol [Meperidine]   . Meperidine Hcl Nausea Only  . Sulfa Antibiotics     Rash   Family History  Problem Relation Age of Onset  . Scleroderma Mother   . Hyperlipidemia Mother   . Coronary artery disease Father 40  . Esophageal cancer Paternal Grandmother    PE: BP 108/80 (BP Location: Left Arm, Patient Position: Sitting, Cuff Size: Normal)   Pulse (!) 119   Temp 98.2 F (36.8 C) (Oral)   Ht 5\' 5"  (1.651 m)   Wt 108 lb 6.4 oz (49.2 kg)   SpO2 98%   BMI 18.04 kg/m  Wt Readings from Last 3 Encounters:  10/19/16 108 lb 6.4 oz (49.2 kg)  10/09/16 107 lb (48.5 kg)  10/04/16 107 lb 8 oz (48.8 kg)   Constitutional: Thin, in NAD. No kyphosis. Eyes: PERRLA, EOMI, no exophthalmos ENT: moist mucous membranes, no thyromegaly, no cervical lymphadenopathy Cardiovascular: tachycardia, irreg. Irreg. rhythm, No MRG Respiratory: CTA B Gastrointestinal: abdomen soft, NT, ND,  BS+ Musculoskeletal: no deformities, strength intact in all 4, no pain at percussion of spine Skin: moist, warm, no rashes Neurological: no tremor with outstretched hands, DTR normal in all 4  Assessment: 1. Osteoporosis  2. Vit D insufficiency  3. A fib  Plan: 1. Osteoporosis - likely postmenopausal - she has FH OP - Discussed about increased risk of fracture, depending on the T score, greatly increased when the T score is lower than -2.5, but it is actually a continuum and -2.5  should not be regarded as an absolute threshold. We reviewed her DEXA scan report together, and I explained that based on the T scores, she has an increased risk for fractures.  - we reviewed her dietary and supplemental calcium and vitamin D intake. I recommended to make sure she gets 1000-1200 mg of calcium daily (will decrease her calcium supplement dose dose to once a day) and continue vit D - now on extra vit D  x1 mo (1000 units daily) >> needs to come back for a new level in 2 mo) - given her specific instructions about food sources for Calcium and Vitamin D - see pt instructions  - discussed fall precautions   - given handout from [place] Re: weight bearing exercises - advised to do this every day or at least 5/7 days - we discussed about maintaining a good amount of protein in her diet. The recommended daily protein intake is ~0.8 g per kilogram per day (approximately 40 g per day for her). I advised her to try to aim for this amount, since a diet low in proteins can exacerbate osteoporosis. Also, avoid smoking or >2 drinks of alcohol a day. - We discussed about the different medication classes, benefits and side effects (including atypical fractures and ONJ - no dental workup in progress or planned).  - I explained that my first choice would be sq denosumab (Prolia) for 6 years, then zoledronic acid (iv Reclast) for 1-2 years. I would use Teriparatide as a last resort. . She agrees.  Pt was given reading information about Prolia, and I explained the mechanism of action and expected benefits.  - She agrees with Prolia inj >> will start the preauthorization form - will check a new DEXA scan in 2 years after starting Prolia -  unchanged or slightly higher T-scores are desirable - will see pt back in a year  2. Vit D insufficiency - on 1000 IU daily + Ca-D - recheck level in 2 mo  3. A. Fib - Patient is tachycardic today and I advised her to take an extra Inderal. However, she also needs to go home and rest. She tells me she has a lot of things to do this afternoon that she is leaving tomorrow for [place]. I strongly advised her to go home and rest for now.  - time spent with the patient: 1 hour, of which >50% was spent in obtaining information about her symptoms, reviewing her previous labs, scans, evaluations, and treatments, counseling her about her condition (please see the discussed topics above), and developing a plan to further treat it; she had a number of questions which I addressed.  Orders Placed This Encounter  Procedures  . Vitamin D, 25-hydroxy    Philemon Kingdom, MD PhD [facility] End[facility]in[facility]

## 2016-10-19 ENCOUNTER — Ambulatory Visit (INDEPENDENT_AMBULATORY_CARE_PROVIDER_SITE_OTHER): Payer: BLUE CROSS/BLUE SHIELD | Admitting: Internal Medicine

## 2016-10-19 ENCOUNTER — Encounter: Payer: Self-pay | Admitting: Internal Medicine

## 2016-10-19 VITALS — BP 108/80 | HR 119 | Temp 98.2°F | Ht 65.0 in | Wt 108.4 lb

## 2016-10-19 DIAGNOSIS — E559 Vitamin D deficiency, unspecified: Secondary | ICD-10-CM

## 2016-10-19 DIAGNOSIS — M810 Age-related osteoporosis without current pathological fracture: Secondary | ICD-10-CM | POA: Diagnosis not present

## 2016-10-19 NOTE — Patient Instructions (Addendum)
Please decrease calcium-D to 1 tablet a day.  Please continue vitamin D supplement at 1000 units.  Please have another vitamin D level checked in 2 months.  Please let me know if you decide for Prolia.  Please return in 1 year.  How Can I Prevent Falls? Men and women with osteoporosis need to take care not to fall down. Falls can break bones. Some reasons people fall are: Poor vision  Poor balance  Certain diseases that affect how you walk  Some types of medicine, such as sleeping pills.  Some tips to help prevent falls outdoors are: Use a cane or walker  Wear rubber-soled shoes so you don't slip  Walk on grass when sidewalks are slippery  In winter, put salt or kitty litter on icy sidewalks.  Some ways to help prevent falls indoors are: Keep rooms free of clutter, especially on floors  Use plastic or carpet runners on slippery floors  Wear low-heeled shoes that provide good support  Do not walk in socks, stockings, or slippers  Be sure carpets and area rugs have skid-proof backs or are tacked to the floor  Be sure stairs are well lit and have rails on both sides  Put grab bars on bathroom walls near tub, shower, and toilet  Use a rubber bath mat in the shower or tub  Keep a flashlight next to your bed  Use a sturdy step stool with a handrail and wide steps  Add more lights in rooms (and night lights) Buy a cordless phone to keep with you so that you don't have to rush to the phone       when it rings and so that you can call for help if you fall.   (adapted from http://www.niams.NightlifePreviews.se)  Dietary sources of calcium and vitamin D:  Calcium content (mg) - http://www.niams.MoviePins.co.za  Fortified oatmeal, 1 packet 350  Sardines, canned in oil, with edible bones, 3 oz. 324  Cheddar cheese, 1 oz. shredded 306  Milk, nonfat, 1 cup 302  Milkshake, 1 cup 300  Yogurt, plain, low-fat, 1 cup 300  Soybeans,  cooked, 1 cup 261  Tofu, firm, with calcium,  cup 204  Orange juice, fortified with calcium, 6 oz. 200-260 (varies)  Salmon, canned, with edible bones, 3 oz. 181  Pudding, instant, made with 2% milk,  cup 153  Baked beans, 1 cup [place], 1% milk fat, 1 cup 138  Spaghetti, lasagna, 1 cup 125  Frozen yogurt, vanilla, soft-serve,  cup 103  Ready-to-eat cereal, fortified with calcium, 1 cup 100-1,000 (varies)  Cheese pizza, 1 slice 161  Fortified waffles, 2 100  Turnip greens, boiled,  cup 99  Broccoli, raw, 1 cup 90  Ice cream, vanilla,  cup 85  Soy or rice milk, fortified with calcium, 1 cup 80-500 (varies)   Vitamin D content (International Units, IU) - https://www.ars.usda.gov Cod liver oil, 1 tablespoon 1,360  Swordfish, cooked, 3 oz 566  Salmon (sockeye), cooked, 3 oz 447  Tuna fish, canned in water, drained, 3 oz 154  Orange juice fortified with vitamin D, 1 cup (check product labels, as amount of added vitamin D varies) 137  Milk, nonfat, reduced fat, and whole, vitamin D-fortified, 1 cup 115-124  Yogurt, fortified with 20% of the daily value for vitamin D, 6 oz 80  Margarine, fortified, 1 tablespoon 60  Sardines, canned in oil, drained, 2 sardines 46  Liver, beef, cooked, 3 oz 42  Egg, 1 large (vitamin D is found  in yolk) 41  Ready-to-eat cereal, fortified with 10% of the daily value for vitamin D, 0.75-1 cup  40  Cheese, Swiss, 1 oz 6   Exercise for Strong Bones (from [place]) There are two types of exercises that are important for building and maintaining bone density:  weight-bearing and muscle-strengthening exercises. Weight-bearing Exercises These exercises include activities that make you move against gravity while staying upright. Weight-bearing exercises can be high-impact or low-impact. High-impact weight-bearing exercises help build bones and keep them strong. If you have broken a bone due to osteoporosis or are at risk of  breaking a bone, you may need to avoid high-impact exercises. If youre not sure, you should check with your healthcare provider. Examples of high-impact weight-bearing exercises are:  Dancing  Doing high-impact aerobics  Hiking  Jogging/running  Jumping Rope  Stair climbing  Tennis Low-impact weight-bearing exercises can also help keep bones strong and are a safe alternative if you cannot do high-impact exercises. Examples of low-impact weight-bearing exercises are:  Using elliptical training machines  Doing low-impact aerobics  Using stair-step machines  Fast walking on a treadmill or outside Muscle-Strengthening Exercises These exercises include activities where you move your body, a weight or some other resistance against gravity. They are also known as resistance exercises and include:  Lifting weights  Using elastic exercise bands  Using weight machines  Lifting your own body weight  Functional movements, such as standing and rising up on your toes Yoga and Pilates can also improve strength, balance and flexibility. However, certain positions may not be safe for people with osteoporosis or those at increased risk of broken bones. For example, exercises that have you bend forward may increase the chance of breaking a bone in the spine. A physical therapist should be able to help you learn which exercises are safe and appropriate for you. Non-Impact Exercises Non-impact exercises can help you to improve balance, posture and how well you move in everyday activities. These exercises can also help to increase muscle strength and decrease the risk of falls and broken bones. Some of these exercises include:  Balance exercises that strengthen your legs and test your balance, such as Tai Chi, can decrease your risk of falls.  Posture exercises that improve your posture and reduce rounded or sloping shoulders can help you decrease the chance of breaking a bone, especially in  the spine.  Functional exercises that improve how well you move can help you with everyday activities and decrease your chance of falling and breaking a bone. For example, if you have trouble getting up from a chair or climbing stairs, you should do these activities as exercises. A physical therapist can teach you balance, posture and functional exercises. Starting a New Exercise Program If you havent exercised regularly for a while, check with your healthcare provider before beginning a new exercise program--particularly if you have health problems such as heart disease, diabetes or high blood pressure. If youre at high risk of breaking a bone, you should work with a physical therapist to develop a safe exercise program. Once you have your healthcare providers approval, start slowly. If youve already broken bones in the spine because of osteoporosis, be very careful to avoid activities that require reaching down, bending forward, rapid twisting motions, heavy lifting and those that increase your chance of a fall. As you get started, your muscles may feel sore for a day or two after you exercise. If soreness lasts longer, you may be working too hard  and need to ease up. Exercises should be done in a pain-free range of motion. How Much Exercise Do You Need? Weight-bearing exercises 30 minutes on most days of the week. Do a 30-minutesession or multiple sessions spread out throughout the day. The benefits to your bones are the same.   Muscle-strengthening exercises Two to three days per week. If you dont have much time for strengthening/resistance training, do small amounts at a time. You can do just one body part each day. For example do arms one day, legs the next and trunk the next. You can also spread these exercises out during your normal day.  Balance, posture and functional exercises Every day or as often as needed. You may want to focus on one area more than the others. If you have fallen or  lose your balance, spend time doing balance exercises. If you are getting rounded shoulders, work more on posture exercises. If you have trouble climbing stairs or getting up from the couch, do more functional exercises. You can also perform these exercises at one time or spread them during your day. Work with a phyiscal therapist to learn the right exercises for you.    Denosumab: Patient drug information (Up-to-date) Copyright (787) 105-2553 [employer] rights reserved.  Brand Names: [place]  ProliaDelton See What is this drug used for?  It is used to treat soft, brittle bones (osteoporosis).  It is used for bone growth.  It is used when treating some cancers.  It may be given to you for other reasons. Talk with the doctor. What do I need to tell my doctor BEFORE I take this drug?  All products:  If you have an allergy to denosumab or any other part of this drug.  If you are allergic to any drugs like this one, any other drugs, foods, or other substances. Tell your doctor about the allergy and what signs you had, like rash; hives; itching; shortness of breath; wheezing; cough; swelling of face, lips, tongue, or throat; or any other signs.  If you have low calcium levels.  Prolia:  If you are pregnant or may be pregnant. Do not take this drug if you are pregnant.  This is not a list of all drugs or health problems that interact with this drug.  Tell your doctor and pharmacist about all of your drugs (prescription or OTC, natural products, vitamins) and health problems. You must check to make sure that it is safe for you to take this drug with all of your drugs and health problems. Do not start, stop, or change the dose of any drug without checking with your doctor. What are some things I need to know or do while I take this drug?  All products:  Tell dentists, surgeons, and other doctors that you use this drug.  This drug may raise the chance of a broken leg. Talk with your doctor.   Have your blood work checked. Talk with your doctor.  Have a bone density test. Talk with your doctor.  Take calcium and vitamin D as you were told by your doctor.  Have a dental exam before starting this drug.  Take good care of your teeth. See a dentist often.  If you smoke, talk with your doctor.  Do not give to a child. Talk with your doctor.  Tell your doctor if you are breast-feeding. You will need to talk about any risks to your baby.  Delton See:  This drug may cause harm to the  unborn baby if you take it while you are pregnant. If you get pregnant while taking this drug, call your doctor right away.  Prolia:  Very bad infections have been reported with use of this drug. If you have any infection, are taking antibiotics now or in the recent past, or have many infections, talk with your doctor.  You may have more chance of getting an infection. Wash hands often. Stay away from people with infections, colds, or flu.  Use birth control that you can trust to prevent pregnancy while taking this drug.  If you are a man and your sex partner is pregnant or gets pregnant at any time while you are being treated, talk with your doctor. What are some side effects that I need to call my doctor about right away?  WARNING/CAUTION: Even though it may be rare, some people may have very bad and sometimes deadly side effects when taking a drug. Tell your doctor or get medical help right away if you have any of the following signs or symptoms that may be related to a very bad side effect:  All products:  Signs of an allergic reaction, like rash; hives; itching; red, swollen, blistered, or peeling skin with or without fever; wheezing; tightness in the chest or throat; trouble breathing or talking; unusual hoarseness; or swelling of the mouth, face, lips, tongue, or throat.  Signs of low calcium levels like muscle cramps or spasms, numbness and tingling, or seizures.  Mouth sores.  Any new or  strange groin, hip, or thigh pain.  This drug may cause jawbone problems. The chance may be higher the longer you take this drug. The chance may be higher if you have cancer, dental problems, dentures that do not fit well, anemia, blood clotting problems, or an infection. The chance may also be higher if you are having dental work or if you are getting chemo, some steroid drugs, or radiation. Call your doctor right away if you have jaw swelling or pain.  Xgeva:  Not hungry.  Muscle pain or weakness.  Seizures.  Shortness of breath.  Prolia:  Signs of infection. These include a fever of 100.36F (38C) or higher, chills, very bad sore throat, ear or sinus pain, cough, more sputum or change in color of sputum, pain with passing urine, mouth sores, wound that will not heal, or anal itching or pain.  Signs of a pancreas problem (pancreatitis) like very bad stomach pain, very bad back pain, or very bad upset stomach or throwing up.  Chest pain.  A heartbeat that does not feel normal.  Very bad skin irritation.  Feeling very tired or weak.  Bladder pain or pain when passing urine or change in how much urine is passed.  Passing urine often.  Swelling in the arms or legs. What are some other side effects of this drug?  All drugs may cause side effects. However, many people have no side effects or only have minor side effects. Call your doctor or get medical help if any of these side effects or any other side effects bother you or do not go away:  Xgeva:  Feeling tired or weak.  Headache.  Upset stomach or throwing up.  Loose stools (diarrhea).  Cough.  Prolia:  Back pain.  Muscle or joint pain.  Sore throat.  Runny nose.  Pain in arms or legs.  These are not all of the side effects that may occur. If you have questions about side effects, call your doctor.  Call your doctor for medical advice about side effects.  You may report side effects to your national health  agency. How is this drug best taken?  Use this drug as ordered by your doctor. Read and follow the dosing on the label closely.  It is given as a shot into the fatty part of the skin. What do I do if I miss a dose?  Call the doctor to find out what to do. How do I store and/or throw out this drug?  This drug will be given to you in a hospital or doctor's office. You will not store it at home.  Keep all drugs out of the reach of children and pets.  Check with your pharmacist about how to throw out unused drugs.  General drug facts  If your symptoms or health problems do not get better or if they become worse, call your doctor.  Do not share your drugs with others and do not take anyone else's drugs.  Keep a list of all your drugs (prescription, natural products, vitamins, OTC) with you. Give this list to your doctor.  Talk with the doctor before starting any new drug, including prescription or OTC, natural products, or vitamins.  Some drugs may have another patient information leaflet. If you have any questions about this drug, please talk with your doctor, pharmacist, or other health care provider.  If you think there has been an overdose, call your poison control center or get medical care right away. Be ready to tell or show what was taken, how much, and when it happened.

## 2016-10-28 ENCOUNTER — Other Ambulatory Visit: Payer: Self-pay | Admitting: Internal Medicine

## 2016-11-20 NOTE — Addendum Note (Signed)
Addended by: Leland Johns A on: 11/20/2016 08:53 AM   Modules accepted: Orders

## 2016-12-20 ENCOUNTER — Telehealth: Payer: Self-pay

## 2016-12-20 ENCOUNTER — Other Ambulatory Visit (INDEPENDENT_AMBULATORY_CARE_PROVIDER_SITE_OTHER): Payer: BLUE CROSS/BLUE SHIELD

## 2016-12-20 DIAGNOSIS — E559 Vitamin D deficiency, unspecified: Secondary | ICD-10-CM

## 2016-12-20 LAB — VITAMIN D 25 HYDROXY (VIT D DEFICIENCY, FRACTURES): VITD: 31.41 ng/mL (ref 30.00–100.00)

## 2016-12-20 NOTE — Telephone Encounter (Signed)
Called and notified patient of Prolia information. We are not sure on the price as she has a 1600 deductible. I advised to try to send the medication to the pharmacy, and then bring it to [place] so we can give it to her as it may be cheaper. Patient understood and will call back to let me know.

## 2017-01-05 ENCOUNTER — Telehealth: Payer: Self-pay | Admitting: Internal Medicine

## 2017-01-05 ENCOUNTER — Telehealth: Payer: Self-pay

## 2017-01-05 NOTE — Telephone Encounter (Signed)
I am not aware about those particular SEs from Prolia, not even from case reports. We can try Reclast iv yearly.

## 2017-01-05 NOTE — Telephone Encounter (Signed)
Sounds good, please let Vaughan Basta Know.

## 2017-01-05 NOTE — Telephone Encounter (Signed)
Called patient, advised of note. She would like to try to do the reclast infusions.

## 2017-01-05 NOTE — Telephone Encounter (Signed)
Pt called in about the Prolia, she said there are two concerns, one being that her Cardiologist said that the Prolia can cause an increase in blood pressure and some possible chest pains, which she was concerned about, and two the drug will cost her $450, twice a year and that is definitely pricey.  She would like to know what other alternatives there could possibly be for her.

## 2017-02-05 ENCOUNTER — Telehealth: Payer: Self-pay | Admitting: Cardiology

## 2017-02-05 NOTE — Telephone Encounter (Signed)
Spoke with patient and she is scheduled to see Donnie Aho PA tomorrow morning

## 2017-02-05 NOTE — Telephone Encounter (Signed)
Patient concerned because of increased episodes of Afib

## 2017-02-05 NOTE — Telephone Encounter (Signed)
New message      Pt states she is having more frequent AFIB.  An appt was made for Friday but pt thinks she may need to be seen sooner.  Please call.

## 2017-02-06 ENCOUNTER — Ambulatory Visit (INDEPENDENT_AMBULATORY_CARE_PROVIDER_SITE_OTHER): Payer: BLUE CROSS/BLUE SHIELD | Admitting: Physician Assistant

## 2017-02-06 ENCOUNTER — Encounter: Payer: Self-pay | Admitting: Physician Assistant

## 2017-02-06 VITALS — BP 135/73 | HR 80 | Ht 65.0 in | Wt 108.4 lb

## 2017-02-06 DIAGNOSIS — Z7901 Long term (current) use of anticoagulants: Secondary | ICD-10-CM | POA: Diagnosis not present

## 2017-02-06 DIAGNOSIS — I480 Paroxysmal atrial fibrillation: Secondary | ICD-10-CM

## 2017-02-06 DIAGNOSIS — I10 Essential (primary) hypertension: Secondary | ICD-10-CM | POA: Diagnosis not present

## 2017-02-06 DIAGNOSIS — E7801 Familial hypercholesterolemia: Secondary | ICD-10-CM

## 2017-02-06 DIAGNOSIS — E78019 Familial hypercholesterolemia, unspecified: Secondary | ICD-10-CM

## 2017-02-06 NOTE — Patient Instructions (Addendum)
Medication Instructions:  Your physician recommends that you continue on your current medications as directed. Please refer to the Current Medication list given to you today.  If you need a refill on your cardiac medications before your next appointment, please call your pharmacy.  Labwork: NONE   Follow-Up: Your physician wants you to follow-up in: 2 MONTHS WITH DR [facility]   Special Instructions: CONSIDER ALIVECOR  RHONDA WILL DISCUSS WITH DR HOCHREIN-PROPRANOLOL-VS-FLECANIDE  RHONDA WILL DISCUSS WITH PHARMACIST- PROLIA-VS-RECLAST(CARDIAC OPTIONS) CHECK BP 3 TIMES A WEEK  LOG AFIB OCCURRENCES    Thank you for choosing [facility] at [place]!!    RHONDA BARRETT PA-C Sharyn Lull, LPN

## 2017-02-06 NOTE — Progress Notes (Signed)
Cardiology Office Note   Date:  02/06/2017   ID:  [name] [name], DOB 1951-08-02, MRN [id number]  PCP:  [name] Dawson, MD  Cardiologist:  Dr. Percival Spanish, 09/28/2016  Rosaria Ferries, PA-C   Chief Complaint  Patient presents with  . Discuss A Fib    History of Present Illness: [name] [name] is a 66 y.o. female with a history of PAF and AI/MR, on anticoagulation with Eliquis (CHA2DS2-VASc=2 for female & HTN)  [name] [name] presents for cardiology evaluation and treatment.  She used to have episodes of afib every 2 months or so.  However, in the last several weeks, she has had several episodes of afib. They were all in the setting of physical stress. The last one was after exercise and in a hot environment. It lasted 12 hours. She took the propanolol as directed, but it still did not stop for hours.   She did not get SOB with the afib. No presyncope or dizziness. She was fatigued, but says part of that comes from the propanolol. When she went to the MDs office, SBP was 106 and HR was 119. Pt says she was in afib at the time, no ECG done.   The atrial fibrillation makes her very symptomatic. She feels weak and tired. The side effects from the propanolol are similar. When she gets an episode of atrial fibrillation, she will go home and rest and take the propanolol. The atrial fibrillation will stop in a few hours. As the episodes are becoming more frequent, they are limiting her activities. This is starting to affect her work.  She is not having any problems with the Eliquis, no bleeding issues.  She also wishes to discuss whether Prolia or Reclast is a better medication for her because of her atrial fibrillation and anticoagulation as well as her hypertension.  She does not check her blood pressure at home.  She brought her last labs with her and her lipid profile shows hyperlipidemia. Her HDL is elevated which is great, but her LDL is also elevated, not so good. She feels that they  generally eat and Mediterranean diet and there is not a lot of room for improvement, but she says she can make some changes.   Past Medical History:  Diagnosis Date  . A-fib ([place])   . Allergic rhinitis   . Aortic insufficiency   . CAT SCRATCH 09/10/2009   Qualifier: Diagnosis of  By: Regis Bill MD, [name]   . CHEST WALL PAIN, ACUTE 07/21/2010   Qualifier: Diagnosis of  By: Sarajane Jews MD, Ishmael Holter   . Clavicle fracture 3/08   left  . Diverticulosis   . Heart palpitations   . HTN (hypertension)   . Osteopenia   . Vision disturbance 03/13/2013   Negative neuro MRA MRI    Past Surgical History:  Procedure Laterality Date  . ABDOMINAL HYSTERECTOMY     pt denies 04/15/14  . APPENDECTOMY    . LAPAROSCOPIC SALPINGO OOPHERECTOMY Right   . TONSILLECTOMY AND ADENOIDECTOMY      Current Outpatient Prescriptions  Medication Sig Dispense Refill  . acetaminophen (TYLENOL) 500 MG tablet Take 500 mg by mouth as needed.    . Calcium Carbonate-Vitamin D (CALCIUM PLUS VITAMIN D PO) Take 1 tablet by mouth daily.     . Cholecalciferol (VITAMIN D3) 1000 units CAPS Take 2 capsules by mouth daily.     [name] diltiazem (CARTIA XT) 300 MG 24 hr capsule Take 1 capsule (300 mg total)  by mouth daily. 90 capsule 3  . ELIQUIS 5 MG TABS tablet TAKE 1 TABLET BY MOUTH TWICE DAILY 180 tablet 1  . Eyelid Cleansers (AVENOVA) 0.01 % SOLN Apply topically daily. Pt wipes the eyelid once daily    . ibuprofen (ADVIL,MOTRIN) 800 MG tablet Take 800 mg by mouth 2 (two) times daily as needed for moderate pain.     [name] LORazepam (ATIVAN) 0.5 MG tablet Take 1 tablet (0.5 mg total) by mouth 3 (three) times daily as needed for anxiety. 20 tablet 0  . montelukast (SINGULAIR) 10 MG tablet TAKE 1 TABLET BY MOUTH DAILY 90 tablet 1  . Multiple Vitamins-Minerals (PRESERVISION AREDS PO) Take 1 tablet by mouth 2 (two) times daily.     . propranolol (INDERAL) 10 MG tablet Take 1 tablet (10 mg total) by mouth every 4 (four) hours as needed. 120 tablet 6    No current facility-administered medications for this visit.     Allergies:   Codeine phosphate; Demerol [meperidine]; Meperidine hcl; and Sulfa antibiotics    Social History:  The patient  reports that she has never smoked. She has never used smokeless tobacco. She reports that she does not drink alcohol or use drugs.   Family History:  The patient's family history includes Coronary artery disease (age of onset: 17) in her father; Esophageal cancer in her paternal grandmother; Hyperlipidemia in her mother; Scleroderma in her mother.    ROS:  Please see the history of present illness. All other systems are reviewed and negative.    PHYSICAL EXAM: VS:  BP 135/73   Pulse 80   Ht 5\' 5"  (1.651 m)   Wt 108 lb 6.4 oz (49.2 kg)   BMI 18.04 kg/m  , BMI Body mass index is 18.04 kg/m. GEN: Well nourished, well developed, female in no acute distress  HEENT: normal for age  Neck: no JVD, no carotid bruit, no masses Cardiac: RRR;  Diastolic murmur, no rubs, or gallops Respiratory:  clear to auscultation bilaterally, normal work of breathing GI: soft, nontender, nondistended, + BS MS: no deformity or atrophy; no edema; distal pulses are 2+ in all 4 extremities   Skin: warm and dry, no rash Neuro:  Strength and sensation are intact Psych: euthymic mood, full affect   EKG:  EKG is ordered today. The ekg ordered today demonstrates sinus rhythm, heart rate 80, normal intervals and no acute ischemic changes   Recent Labs: 09/14/2016: ALT 15; BUN 16; Creatinine, Ser 0.69; Hemoglobin 14.6; Platelets 243.0; Potassium 4.0; Sodium 142; TSH 1.22    Lipid Panel    Component Value Date/Time   CHOL 238 (H) 09/14/2016 0805   TRIG 68.0 09/14/2016 0805   HDL 87.50 09/14/2016 0805   CHOLHDL 3 09/14/2016 0805   VLDL 13.6 09/14/2016 0805   LDLCALC 137 (H) 09/14/2016 0805   LDLDIRECT 131.0 09/03/2009 0813     Wt Readings from Last 3 Encounters:  02/06/17 108 lb 6.4 oz (49.2 kg)  10/19/16 108  lb 6.4 oz (49.2 kg)  10/09/16 107 lb (48.5 kg)     Other studies Reviewed: Additional studies/ records that were reviewed today include: Office notes, hospital records and testing.  ASSESSMENT AND PLAN:  1.  PAF: She is concerned about several things. She is very symptomatic with the atrial fibrillation. Her ocurrences have increased both in frequency and duration. She will go home and rest and take her propranolol. The symptoms would go away but it takes several hours. Then she has to  do with side effects from the propanolol.  I discussed that she could wear a monitor to see if she is having atrial fibrillation when she doesn't know about it, and to help document the Luan Moore was seen duration of the episodes. I also advised that since she is so symptomatic with her episodes, it would probably be adequate to get [employer]. She can order this on the Internet from multiple sources. It worked pretty well and all she has to do his grasp the pad. It will record her ECG tracing and the heart rate as well as documenting the date and time. She is thinking more seriously about doing that, will contact us if she wants to do the monitor.  **I think it may be time to an antiarrhythmic. I mentioned flecainide and she said Dr. Percival Spanish had talked to her about that before. I will discuss this with him.**  2. Chronic anticoagulation: Continue Eliquis, no side effects  3. Hypertension: Her blood pressure slightly above target. She admits to increased stress recently but then her job and in the seen frequency of the A. fib. She is encouraged to track her blood pressure at home several times a week and let us know what it is running. Based on her current readings, she could tolerate an increase in her diltiazem to 360 mg daily.  4. Hyperlipidemia: She was advised that her LDL is greater than 130 at 137. She eats mostly a Mediterranean diet. Her hyperlipidemia is mostly genetic, but she is encouraged to make changes she  can. There is no urgent reason to start a statin so we'll hold off at this time.  5. Osteoporosis: I was able to review the Prolia vs Reclast with Racquel, the pharmacist here. From a cardiac standpoint, there is no real difference. There is a 10% chance of hypertension associated with either of these medications. However, this is temporary and should resolve in less than a week. I advised the patient to find a provider she was comfortable with and follow their advice when it comes to prescribing this.   Current medicines are reviewed at length with the patient today.  The patient has concerns regarding medicines.  The following changes have been made:  no change  Labs/ tests ordered today include:   Orders Placed This Encounter  Procedures  . EKG 12-Lead     Disposition:   FU with Dr. Percival Spanish  Signed, Rosaria Ferries, PA-C  02/06/2017 2:01 PM    [facility] Phone: [phone]; Fax: [phone]  This note was written with the assistance of speech recognition software. Please excuse any transcriptional errors.

## 2017-02-09 ENCOUNTER — Ambulatory Visit: Payer: Self-pay | Admitting: Cardiology

## 2017-03-16 ENCOUNTER — Telehealth: Payer: Self-pay | Admitting: Internal Medicine

## 2017-03-16 MED ORDER — AMOXICILLIN-POT CLAVULANATE 875-125 MG PO TABS: 1.0000 | ORAL_TABLET | Freq: Two times a day (BID) | ORAL | 0 refills | Status: DC

## 2017-03-16 NOTE — Telephone Encounter (Signed)
Spoke with pt, who states on Monday she developed right upper tooth pain and pressure. Pt states she did contact her dentist on Wend[place], who states symptoms sounded sinus related. Pt c/o very mild head congestion. Pt denies any cough, wheezing, sob or chest discomfort. Pt states she is taken tylenol with mild relief. Pt states her dentist also recommend that she do a sinus rinse. Pt did sinus rinse Wednesday but feels the rinse made her feel worse.  Pt is scheduled for OV with CY on 03/19/17. Pt is requesting Rx to help with pain.  CY please advise. Thanks.   Current Outpatient Prescriptions on File Prior to Visit  Medication Sig Dispense Refill  . acetaminophen (TYLENOL) 500 MG tablet Take 500 mg by mouth as needed.    . Calcium Carbonate-Vitamin D (CALCIUM PLUS VITAMIN D PO) Take 1 tablet by mouth daily.     . Cholecalciferol (VITAMIN D3) 1000 units CAPS Take 2 capsules by mouth daily.     [name] diltiazem (CARTIA XT) 300 MG 24 hr capsule Take 1 capsule (300 mg total) by mouth daily. 90 capsule 3  . ELIQUIS 5 MG TABS tablet TAKE 1 TABLET BY MOUTH TWICE DAILY 180 tablet 1  . Eyelid Cleansers (AVENOVA) 0.01 % SOLN Apply topically daily. Pt wipes the eyelid once daily    . ibuprofen (ADVIL,MOTRIN) 800 MG tablet Take 800 mg by mouth 2 (two) times daily as needed for moderate pain.     [name] LORazepam (ATIVAN) 0.5 MG tablet Take 1 tablet (0.5 mg total) by mouth 3 (three) times daily as needed for anxiety. 20 tablet 0  . montelukast (SINGULAIR) 10 MG tablet TAKE 1 TABLET BY MOUTH DAILY 90 tablet 1  . Multiple Vitamins-Minerals (PRESERVISION AREDS PO) Take 1 tablet by mouth 2 (two) times daily.     . propranolol (INDERAL) 10 MG tablet Take 1 tablet (10 mg total) by mouth every 4 (four) hours as needed. 120 tablet 6   No current facility-administered medications on file prior to visit.     Allergies  Allergen Reactions  . Codeine Phosphate     drowsy  . Demerol [Meperidine] Nausea Only  . Meperidine Hcl  Nausea Only  . Sulfa Antibiotics     Rash

## 2017-03-16 NOTE — Telephone Encounter (Signed)
Called and spoke with pt and she is aware of CY recs.  abx has been called to the pharmacy and pt is aware.  She stated that she will use tylenol for the pain as needed due to her heart issues.

## 2017-03-16 NOTE — Telephone Encounter (Signed)
She doesn't do well with narcotics.  Suggest we cover possible acute maxillary sinusitis with augmentin 875 mg, # 14, 1 twice daily  Suggest ibuprofen for pain

## 2017-03-19 ENCOUNTER — Encounter: Payer: Self-pay | Admitting: Internal Medicine

## 2017-03-19 ENCOUNTER — Ambulatory Visit (INDEPENDENT_AMBULATORY_CARE_PROVIDER_SITE_OTHER): Payer: BLUE CROSS/BLUE SHIELD | Admitting: Internal Medicine

## 2017-03-19 DIAGNOSIS — J301 Allergic rhinitis due to pollen: Secondary | ICD-10-CM

## 2017-03-19 DIAGNOSIS — J0100 Acute maxillary sinusitis, unspecified: Secondary | ICD-10-CM

## 2017-03-19 NOTE — Assessment & Plan Note (Signed)
Has not recognized significant spring allergy symptoms as long as she continues Singulair. She does have access to antihistamines and nasal steroid if needed.

## 2017-03-19 NOTE — Progress Notes (Signed)
Patient ID: [name] [name], female    DOB: 11-16-1950, 66 y.o.   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 And she has done well with a few colds over the last winter. Uses nasal saline, Singulair and Flonase appropriately when needed.  03/04/15- 62 yoF never smoker followed for allergic rhinitis. 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 She feels this as a sensation in her throat and interpreted as postnasal drainage. Not much nose blowing or sneezing and no headache. We'll explore possibility she was feeling effects of reflux when supine.  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 Friday last week it became more painful-no fullness or pain in sinus cavity. Began augmentin 5/18 CXR 08/11/16-IMPRESSION: Clearing of previously noted right-sided pulmonary infiltrate. Lungs are clear on the current exam Developed pain in right upper jaw at the end of last week. She called her dentist thought from description it was more likely to be a sinus problem, so she called [place] and was sent Augmentin. She may have had minimal cold symptoms midweek but no significant nasal congestion or discharge and no discomfort with her right ear. She is somewhat better now halfway through Augmentin but has been favoring the right side of her mouth with chewing etc. She continues Singulair without substantial  spring rhinitis symptoms this year.  ROS-see HPI       + = pos Constitutional:   No-   weight loss, night sweats, fevers, chills, fatigue, lassitude. HEENT:   No-  headaches, difficulty swallowing, tooth/dental problems, sore throat,       No-  sneezing, itching, ear ache, nasal congestion, +post nasal drip,  CV:  No-   chest pain, orthopnea, PND, swelling in lower extremities, anasarca, dizziness, palpitations Resp: No-   shortness of breath with exertion or at rest.              No-   productive cough,  No non-productive cough,  No- coughing up of blood.              No-   change in color of mucus.  No- wheezing.   Skin: No-   rash or lesions. GI:  No-   heartburn, indigestion, abdominal pain, nausea, vomiting,  GU:  MS:  No-   joint pain or swelling.   Neuro-     nothing unusual Psych:  No- change in mood or affect. No depression or anxiety.  No memory loss.   OBJ- Physical Exam General- Alert, Oriented, Affect-appropriate, Distress- none acute .  Trim. Skin- rash-none, lesions- none, excoriation- none Lymphadenopathy- none Head- atraumatic            Eyes- Gross vision intact, PERRLA, conjunctivae and secretions clear            Ears- Hearing, canals-normal            Nose- Clear, pale, no-Septal dev, mucus, polyps, erosion, perforation             Throat- Mallampati III , mucosa  clear , drainage- none, tonsils- atrophic Neck- flexible , trachea midline, no stridor , thyroid nl, carotid no bruit Chest - symmetrical excursion , unlabored           Heart/CV- RRR , trace diastolic murmur L sternum , no gallop  , no rub, nl s1 s2                           - JVD- none , edema- none, stasis changes- none, varices- none           Lung- clear to P&A, wheeze- none, cough- none , dullness-none, rub- none           Chest wall-  Abd- Br/ Gen/ Rectal- Not done, not indicated Extrem- cyanosis- none, clubbing, none, atrophy- none, strength- nl Neuro- grossly intact to observation

## 2017-03-19 NOTE — Patient Instructions (Signed)
Finish the antibiotic then call if not substantially over the discomfort

## 2017-03-19 NOTE — Assessment & Plan Note (Signed)
This may actually be a dental problem and I did suggest she go on follow-up with her dentist to double check. Although she is protecting the right side of her mouth, pain has improved while on Augmentin. I suggested she finish the Augmentin and contact us if still uncomfortable at that point for consideration of CT sinuses.

## 2017-03-20 ENCOUNTER — Telehealth: Payer: Self-pay | Admitting: Nutrition

## 2017-03-20 NOTE — Telephone Encounter (Signed)
[name] [name], is it possible to get Prolia cheaper for her? She has a high deductible - see prev. Notes. Do you think a PA would help?

## 2017-03-20 NOTE — Telephone Encounter (Signed)
This is concerning reclast infusion. She has a $1600. Deductible and $3500.  Out of pocket expense .   She wanted to know the cost of the infusion, and Reclast.  I told her that the cost of the infusion was $191.00, and the Reclast cost was $2025.00.  She wants to know if she can go back on the oral agents.

## 2017-03-21 NOTE — Telephone Encounter (Signed)
Looking back over the portal and the recent notes. The patient would need to pay 450 $ for each injection with the medication already authorized.

## 2017-03-22 ENCOUNTER — Other Ambulatory Visit: Payer: Self-pay

## 2017-03-22 MED ORDER — ALENDRONATE SODIUM 70 MG PO TABS
70.0000 mg | ORAL_TABLET | ORAL | 1 refills | Status: DC
Start: 2017-03-22 — End: 2017-08-15

## 2017-03-22 NOTE — Telephone Encounter (Signed)
Called to notify patient that we could restart the Fosamax, patient agreed to start, I submitted to pharmacy.

## 2017-03-22 NOTE — Telephone Encounter (Signed)
[name] [name], we can restart Fosamax 70 mg once a week, if she agrees. She needs to take it in the morning, with a full glass of water, and not lay down for 30 minutes after she takes it to make sure that the medication gets to her stomach.

## 2017-04-03 ENCOUNTER — Other Ambulatory Visit: Payer: Self-pay | Admitting: Cardiology

## 2017-04-09 NOTE — Progress Notes (Deleted)
HPI The patient presents for follow up of palpitations and aortic insufficiency. She wore an event monitor which demonstrated paroxysmal atrial fibrillation.   She was seen recently and was complaining of increased palpitations.  ***    Since I last saw her she said another couple of episodes still. The episodes last for minutes to  hours. She'll take the propranolol and this seems to help.  They are associated with decreased sleep and stress. She otherwise has been feeling relatively well.   She's not describing chest pressure, neck or arm discomfort. She's not describing PND or orthopnea. She's had no weight gain or edema.    Allergies  Allergen Reactions  . Codeine Phosphate     drowsy  . Demerol [Meperidine] Nausea Only  . Meperidine Hcl Nausea Only  . Sulfa Antibiotics     Rash    Current Outpatient Prescriptions  Medication Sig Dispense Refill  . acetaminophen (TYLENOL) 500 MG tablet Take 500 mg by mouth as needed.    [name] alendronate (FOSAMAX) 70 MG tablet Take 1 tablet (70 mg total) by mouth once a week. Take with a full glass of water on an empty stomach. 12 tablet 1  . amoxicillin-clavulanate (AUGMENTIN) 875-125 MG tablet Take 1 tablet by mouth 2 (two) times daily. 14 tablet 0  . Calcium Carbonate-Vitamin D (CALCIUM PLUS VITAMIN D PO) Take 1 tablet by mouth daily.     . Cholecalciferol (VITAMIN D3) 1000 units CAPS Take 2 capsules by mouth daily.     [name] diltiazem (CARTIA XT) 300 MG 24 hr capsule Take 1 capsule (300 mg total) by mouth daily. 90 capsule 3  . ELIQUIS 5 MG TABS tablet TAKE 1 TABLET BY MOUTH TWICE DAILY 180 tablet 1  . ibuprofen (ADVIL,MOTRIN) 800 MG tablet Take 800 mg by mouth 2 (two) times daily as needed for moderate pain.     [name] LORazepam (ATIVAN) 0.5 MG tablet Take 1 tablet (0.5 mg total) by mouth 3 (three) times daily as needed for anxiety. 20 tablet 0  . montelukast (SINGULAIR) 10 MG tablet TAKE 1 TABLET BY MOUTH DAILY 90 tablet 1  . Multiple Vitamins-Minerals  (PRESERVISION AREDS PO) Take 1 tablet by mouth 2 (two) times daily.     . propranolol (INDERAL) 10 MG tablet Take 1 tablet (10 mg total) by mouth every 4 (four) hours as needed. 120 tablet 6   No current facility-administered medications for this visit.     Past Medical History:  Diagnosis Date  . A-fib ([place])   . Allergic rhinitis   . Aortic insufficiency   . CAT SCRATCH 09/10/2009   Qualifier: Diagnosis of  By: Regis Bill MD, [name]   . CHEST WALL PAIN, ACUTE 07/21/2010   Qualifier: Diagnosis of  By: Sarajane Jews MD, Ishmael Holter   . Clavicle fracture 3/08   left  . Diverticulosis   . Heart palpitations   . HTN (hypertension)   . Osteopenia   . Vision disturbance 03/13/2013   Negative neuro MRA MRI    Past Surgical History:  Procedure Laterality Date  . ABDOMINAL HYSTERECTOMY     pt denies 04/15/14  . APPENDECTOMY    . LAPAROSCOPIC SALPINGO OOPHERECTOMY Right   . TONSILLECTOMY AND ADENOIDECTOMY      ROS:   ***  PHYSICAL EXAM There were no vitals taken for this visit.  GENERAL:  Well appearing NECK:  No jugular venous distention, waveform within normal limits, carotid upstroke brisk and symmetric, no bruits, no thyromegaly LUNGS:  Clear to auscultation bilaterally BACK:  No CVA tenderness CHEST:  Unremarkable HEART:  PMI not displaced or sustained,S1 and S2 within normal limits, no S3, no S4, no clicks, no rubs, *** murmurs ABD:  Flat, positive bowel sounds normal in frequency in pitch, no bruits, no rebound, no guarding, no midline pulsatile mass, no hepatomegaly, no splenomegaly EXT:  2 plus pulses throughout, no edema, no cyanosis no clubbing   GENERAL:  Well appearing NECK:  No jugular venous distention, waveform within normal limits, carotid upstroke brisk and symmetric, no bruits, no thyromegaly LUNGS:  Clear to auscultation bilaterally CHEST:  Unremarkable HEART:  PMI not displaced or sustained,S1 and S2 within normal limits, no S3, no S4, no clicks, no rubs, diastolic murmur  mid to late at the left 3rd intercostal space. ABD:  Flat, positive bowel sounds normal in frequency in pitch, no bruits, no rebound, no guarding, no midline pulsatile mass, no hepatomegaly, no splenomegaly EXT:  2 plus pulses throughout, no edema, no cyanosis no clubbing  EKG:  ***  ASSESSMENT AND PLAN  Aortic insufficiency -  This was mild to moderate on echo in October 2017.  ***  Atrial fib-  Ms. Marcellina D Sparr has a CHA2DS2 - VASc score of 2.  ***She will continue Eliquis.  For now she will continue with rate control and increased Cardizem.  If she has any increasing frequency or severity of her episodes she will come back and we'll talk about starting flecainide.  However we both agree that we are not at that point yet.   HTN - The blood pressure is ***  at target (barely). No change in medications is indicated. We will continue with therapeutic lifestyle changes (TLC).

## 2017-04-12 ENCOUNTER — Ambulatory Visit: Payer: BLUE CROSS/BLUE SHIELD | Admitting: Cardiology

## 2017-04-26 ENCOUNTER — Other Ambulatory Visit: Payer: Self-pay | Admitting: Internal Medicine

## 2017-05-07 NOTE — Progress Notes (Signed)
HPI The patient presents for follow up of palpitations and aortic insufficiency. She wore an event monitor which demonstrated paroxysmal atrial fibrillation.   She was seen recently and was complaining of increased palpitations.  Curiously these were happening on Saturday mornings when she would exercise . There were 4 episodes with 2 in Apirl, one in May and one in June.  These lasted about 10 hours apiece.  She has stopped exercising on Saturday morning but she is afraid to exercise any morning.  She can exercise in the afternoon and not have palpitations.  She is sure that it is fib as it feels like previous fib.  She's not describing new chest pressure, neck or arm discomfort. She's not had any presyncope or syncope.  Allergies  Allergen Reactions  . Codeine Phosphate     drowsy  . Demerol [Meperidine] Nausea Only  . Meperidine Hcl Nausea Only  . Sulfa Antibiotics     Rash    Current Outpatient Prescriptions  Medication Sig Dispense Refill  . acetaminophen (TYLENOL) 500 MG tablet Take 500 mg by mouth as needed.    [name] alendronate (FOSAMAX) 70 MG tablet Take 1 tablet (70 mg total) by mouth once a week. Take with a full glass of water on an empty stomach. 12 tablet 1  . Calcium Carbonate-Vitamin D (CALCIUM PLUS VITAMIN D PO) Take 1 tablet by mouth daily.     . Cholecalciferol (VITAMIN D3) 1000 units CAPS Take 2 capsules by mouth daily.     [name] diltiazem (CARTIA XT) 300 MG 24 hr capsule Take 1 capsule (300 mg total) by mouth daily. 90 capsule 3  . ELIQUIS 5 MG TABS tablet TAKE 1 TABLET BY MOUTH TWICE DAILY 180 tablet 1  . ibuprofen (ADVIL,MOTRIN) 800 MG tablet Take 800 mg by mouth 2 (two) times daily as needed for moderate pain.     [name] LORazepam (ATIVAN) 0.5 MG tablet Take 1 tablet (0.5 mg total) by mouth 3 (three) times daily as needed for anxiety. 20 tablet 0  . montelukast (SINGULAIR) 10 MG tablet TAKE 1 TABLET BY MOUTH DAILY 90 tablet 1  . Multiple Vitamins-Minerals (PRESERVISION AREDS  PO) Take 1 tablet by mouth 2 (two) times daily.     . propranolol (INDERAL) 10 MG tablet Take 1 tablet (10 mg total) by mouth every 4 (four) hours as needed. 120 tablet 6   No current facility-administered medications for this visit.     Past Medical History:  Diagnosis Date  . A-fib ([place])   . Allergic rhinitis   . Aortic insufficiency   . CAT SCRATCH 09/10/2009   Qualifier: Diagnosis of  By: Regis Bill MD, [name]   . CHEST WALL PAIN, ACUTE 07/21/2010   Qualifier: Diagnosis of  By: Sarajane Jews MD, Ishmael Holter   . Clavicle fracture 3/08   left  . Diverticulosis   . Heart palpitations   . HTN (hypertension)   . Osteopenia   . Vision disturbance 03/13/2013   Negative neuro MRA MRI    Past Surgical History:  Procedure Laterality Date  . ABDOMINAL HYSTERECTOMY     pt denies 04/15/14  . APPENDECTOMY    . LAPAROSCOPIC SALPINGO OOPHERECTOMY Right   . TONSILLECTOMY AND ADENOIDECTOMY      ROS:   As stated in the HPI and negative for all other systems.  PHYSICAL EXAM BP 128/72   Pulse 72   Ht 5\' 5"  (1.651 m)   Wt 108 lb 3.2 oz (49.1 kg)   BMI  18.01 kg/m   GENERAL:  Well appearing NECK:  No jugular venous distention, waveform within normal limits, carotid upstroke brisk and symmetric, no bruits, no thyromegaly LUNGS:  Clear to auscultation bilaterally BACK:  No CVA tenderness CHEST:  Unremarkable HEART:  PMI not displaced or sustained,S1 and S2 within normal limits, no S3, no S4, no clicks, no rubs, 2 out of 6 left third and fourth intercostal space diastolic murmur ABD:  Flat, positive bowel sounds normal in frequency in pitch, no bruits, no rebound, no guarding, no midline pulsatile mass, no hepatomegaly, no splenomegaly EXT:  2 plus pulses throughout, no edema, no cyanosis no clubbing   EKG:  Normal sinus rhythm, rate 72, axis within normal limits, intervals within normal limits, possible left enlargement  ASSESSMENT AND PLAN  Aortic insufficiency -  This was mild to moderate on echo  in October 2017.  I will repeat this in October of this year  Atrial fib-  [name] [name] has a CHA2DS2 - VASc score of 2.     She will continue Eliquis.  For now she will continue with rate control and increased Cardizem.  However, in anticipation that she will soon want to start flecainide (which she has wanted to delay) I will order a POET (Plain Old Exercise Treadmill).  If she has increasing palpitations despite changing her exercise regimen and skipping the morning Saturday routines she will call me and we would likely start medication if her she has a normal stress test.  In the meantime, I have suggested an Alive Cor.    HTN - The blood pressure is at target.

## 2017-05-08 ENCOUNTER — Ambulatory Visit (INDEPENDENT_AMBULATORY_CARE_PROVIDER_SITE_OTHER): Payer: BLUE CROSS/BLUE SHIELD | Admitting: Cardiology

## 2017-05-08 VITALS — BP 128/72 | HR 72 | Ht 65.0 in | Wt 108.2 lb

## 2017-05-08 DIAGNOSIS — I351 Nonrheumatic aortic (valve) insufficiency: Secondary | ICD-10-CM

## 2017-05-08 DIAGNOSIS — I480 Paroxysmal atrial fibrillation: Secondary | ICD-10-CM | POA: Diagnosis not present

## 2017-05-08 DIAGNOSIS — I10 Essential (primary) hypertension: Secondary | ICD-10-CM | POA: Diagnosis not present

## 2017-05-08 NOTE — Patient Instructions (Signed)
Medication Instructions:  Continue current medications  Labwork: None ordered  Testing/Procedures: Your physician has requested that you have an exercise tolerance test. For further information please visit [email or web address]. Please also follow instruction sheet, as given.  Follow-Up: Your physician recommends that you schedule a follow-up appointment in: 6 Weeks   Any Other Special Instructions Will Be Listed Below (If Applicable).   ALIVECOR   If you need a refill on your cardiac medications before your next appointment, please call your pharmacy.

## 2017-05-09 ENCOUNTER — Encounter: Payer: Self-pay | Admitting: Cardiology

## 2017-06-06 ENCOUNTER — Telehealth (HOSPITAL_COMMUNITY): Payer: Self-pay

## 2017-06-06 ENCOUNTER — Encounter (HOSPITAL_COMMUNITY): Payer: Self-pay

## 2017-06-06 NOTE — Telephone Encounter (Signed)
Encounter complete. 

## 2017-06-08 ENCOUNTER — Ambulatory Visit (INDEPENDENT_AMBULATORY_CARE_PROVIDER_SITE_OTHER): Payer: BLUE CROSS/BLUE SHIELD | Admitting: Cardiovascular Disease

## 2017-06-08 ENCOUNTER — Ambulatory Visit (HOSPITAL_COMMUNITY)
Admission: RE | Admit: 2017-06-08 | Discharge: 2017-06-08 | Disposition: A | Discharge status: Home / Self Care | Payer: BLUE CROSS/BLUE SHIELD | Source: Ambulatory Visit | Attending: Cardiovascular Disease | Admitting: Cardiovascular Disease

## 2017-06-08 DIAGNOSIS — I480 Paroxysmal atrial fibrillation: Secondary | ICD-10-CM

## 2017-06-08 DIAGNOSIS — Z7901 Long term (current) use of anticoagulants: Secondary | ICD-10-CM | POA: Diagnosis not present

## 2017-06-08 DIAGNOSIS — I10 Essential (primary) hypertension: Secondary | ICD-10-CM | POA: Diagnosis not present

## 2017-06-08 DIAGNOSIS — I351 Nonrheumatic aortic (valve) insufficiency: Secondary | ICD-10-CM

## 2017-06-08 MED ORDER — METOPROLOL TARTRATE 25 MG PO TABS: 25.0000 mg | ORAL_TABLET | Freq: Two times a day (BID) | ORAL | 3 refills | Status: DC

## 2017-06-08 NOTE — Patient Instructions (Signed)
Medication Instructions:  START metoprolol 25mg  (1 tablet) two times daily  Follow-Up: Monday 8/13 at 3:20 with Dr. Claiborne Billings.  Any Other Special Instructions Will Be Listed Below (If Applicable).     If you need a refill on your cardiac medications before your next appointment, please call your pharmacy.

## 2017-06-11 ENCOUNTER — Encounter: Payer: Self-pay | Admitting: Cardiovascular Disease

## 2017-06-11 ENCOUNTER — Ambulatory Visit (INDEPENDENT_AMBULATORY_CARE_PROVIDER_SITE_OTHER): Payer: BLUE CROSS/BLUE SHIELD | Admitting: Cardiovascular Disease

## 2017-06-11 VITALS — BP 138/68 | HR 49 | Ht 65.0 in | Wt 108.6 lb

## 2017-06-11 DIAGNOSIS — E7800 Pure hypercholesterolemia, unspecified: Secondary | ICD-10-CM

## 2017-06-11 DIAGNOSIS — I480 Paroxysmal atrial fibrillation: Secondary | ICD-10-CM

## 2017-06-11 DIAGNOSIS — Z7901 Long term (current) use of anticoagulants: Secondary | ICD-10-CM

## 2017-06-11 DIAGNOSIS — I351 Nonrheumatic aortic (valve) insufficiency: Secondary | ICD-10-CM | POA: Diagnosis not present

## 2017-06-11 LAB — EXERCISE TOLERANCE TEST
CSEPED: 7 min
CSEPEW: 9.7 METS
CSEPPHR: 190 {beats}/min
Exercise duration (sec): 48 s
MPHR: 156 {beats}/min
Percent HR: 121 %
RPE: 18
Rest HR: 68 {beats}/min

## 2017-06-11 NOTE — Patient Instructions (Signed)
No changes were made today in your therapy. Keep appointment scheduled with Dr Percival Spanish on 06/29/17 as planned.

## 2017-06-11 NOTE — Addendum Note (Signed)
Addended by: Zebedee Iba on: 06/11/2017 08:57 AM   Modules accepted: Orders

## 2017-06-11 NOTE — Addendum Note (Signed)
Addended by: Zebedee Iba on: 06/11/2017 08:55 AM   Modules accepted: Orders

## 2017-06-11 NOTE — Progress Notes (Signed)
Cardiology Office Note    Date:  06/13/2017   ID:  [name] [name], DOB 08/19/1951, MRN [id number]  PCP:  Burnis Medin, MD  Cardiologist:  Shelva Majestic, MD   DOD add on: AF on GXT today  History of Present Illness:  [name] [name] is a 66 y.o. female patient who is followed by Dr. Percival Spanish.  She was seen in the office on 06/08/2017 after she developed atrial fibrillation during her routine treadmill test in the office  which had .  She presents to the office today for follow-up assessment.  [name] [name] has a history of palpitations and aortic insufficiency.  She had worn an event monitor which demonstrated paroxysmal atrial fibrillation.  Oftentimes she was noticing episodes of atrial fibrillation on Saturdas when she would exercise.  She had been given a prescription for propranolol and was advised to only take 10 mg.  Usually, this would not result in restoration of sinus rhythm and she would take and she will take an additional pill.  At times it would take up to 10 hours for restoration of sinus rhythm and she would notice that she would be back in the normal rhythm the following morning after sleeping.  The patient had undergone a prior echo Doppler study in October 2017 which showed normal systolic function with grade 2 diastolic dysfunction and mild to moderate aortic insufficiency.  [name] [name] was referred by Dr. Percival Spanish for routine treadmill test on 06/08/2017.  Her baseline ECG showed sinus rhythm at 68 bpm.  Resting blood pressure was 150/74.  She exercised for 7 minutes and 48 seconds into the standard Bruce protocol.  Immediately after cessation of exercise, on the ECG taken at 14 seconds into recovery, she flipped into atrial fibrillation with rapid ventricular response.  This was associated with mild ST-T changes.  She did not have any chest pain.  At 555.  Into recovery.  There was a ventricular couplet and isolated PVC.  She continued to have 1 mm inferolateral ST segment changes.  I was  notified that she had gone into atrial fibrillation.  I went to the treadmill room and examined the patient.  With her persistent AF, even after 20 minutes following exercis her ventricular rate was in the 130s to 140s..  I recommended an initial metoprolol tartrate 25 mg given orally, which was administered at at 4:35 PM.   After approximaltey 30 minutes, ventricular rate was still in the 120s.  At close to 1 hour  ventricular rate had slowed but she was still in the 100-110 range.  I had advised that the patient not drive home and had spoken to her husband who ultimately came to the office.  She was given an additional metoprolol 12.5 mg one hour after initial 25 mg dose.  She was monitored for another 30 minutes after this interventricular rate slowed to the 95-100 range.  She was not having any symptoms of chest pain.  She is on anticoagulation already with eliquis  I advised that when she goes home that she take the remaining 12.5 mg for total dose this evening of 50 mg and then initiate 25 mg twice a day.  She tells me today that when she went home she took the extra 12.5 mg of metoprolol.  By 6:30 PM, she had reverted back to sinus rhythm.  She is maintaining sinus rhythm.  Over the weekend.  She denies chest pain or shortness of breath.  She denies dizziness or lightheadedness.  She presents for reassessment.  Past Medical History:  Diagnosis Date  . A-fib ([place])   . Allergic rhinitis   . Aortic insufficiency   . CAT SCRATCH 09/10/2009   Qualifier: Diagnosis of  By: Regis Bill MD, [name]   . CHEST WALL PAIN, ACUTE 07/21/2010   Qualifier: Diagnosis of  By: Sarajane Jews MD, Ishmael Holter   . Clavicle fracture 3/08   left  . Diverticulosis   . Heart palpitations   . HTN (hypertension)   . Osteopenia   . Vision disturbance 03/13/2013   Negative neuro MRA MRI    Past Surgical History:  Procedure Laterality Date  . ABDOMINAL HYSTERECTOMY     pt denies 04/15/14  . APPENDECTOMY    . LAPAROSCOPIC SALPINGO  OOPHERECTOMY Right   . TONSILLECTOMY AND ADENOIDECTOMY      Current Medications: Outpatient Medications Prior to Visit  Medication Sig Dispense Refill  . acetaminophen (TYLENOL) 500 MG tablet Take 500 mg by mouth as needed.    [name] alendronate (FOSAMAX) 70 MG tablet Take 1 tablet (70 mg total) by mouth once a week. Take with a full glass of water on an empty stomach. 12 tablet 1  . Calcium Carbonate-Vitamin D (CALCIUM PLUS VITAMIN D PO) Take 1 tablet by mouth daily.     . Cholecalciferol (VITAMIN D3) 1000 units CAPS Take 2 capsules by mouth daily.     [name] diltiazem (CARTIA XT) 300 MG 24 hr capsule Take 1 capsule (300 mg total) by mouth daily. 90 capsule 3  . ELIQUIS 5 MG TABS tablet TAKE 1 TABLET BY MOUTH TWICE DAILY 180 tablet 1  . ibuprofen (ADVIL,MOTRIN) 800 MG tablet Take 800 mg by mouth 2 (two) times daily as needed for moderate pain.     [name] LORazepam (ATIVAN) 0.5 MG tablet Take 1 tablet (0.5 mg total) by mouth 3 (three) times daily as needed for anxiety. 20 tablet 0  . metoprolol tartrate (LOPRESSOR) 25 MG tablet Take 1 tablet (25 mg total) by mouth 2 (two) times daily. 180 tablet 3  . montelukast (SINGULAIR) 10 MG tablet TAKE 1 TABLET BY MOUTH DAILY 90 tablet 1  . Multiple Vitamins-Minerals (PRESERVISION AREDS PO) Take 1 tablet by mouth 2 (two) times daily.     . propranolol (INDERAL) 10 MG tablet Take 1 tablet (10 mg total) by mouth every 4 (four) hours as needed. 120 tablet 6   No facility-administered medications prior to visit.      Allergies:   Codeine phosphate; Demerol [meperidine]; Meperidine hcl; and Sulfa antibiotics   Social History   Social History  . Marital status: Married    Spouse name: N/A  . Number of children: 0  . Years of education: N/A   Occupational History  . 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 Smoking status: Never Smoker  . Smokeless tobacco: Never Used  . Alcohol use No     Comment: never  . Drug use: No    . Sexual activity: Not Asked   Other Topics Concern  . None   Social History Narrative   Married   hh of 2    p0g0   Works for [employer]      Family History:  The patient's family history includes Coronary artery disease (age of onset: 57) in her father; Esophageal cancer in her paternal grandmother; Hyperlipidemia in her mother; Scleroderma in her mother.   ROS General: Negative; No fevers, chills,  or night sweats;  HEENT: Negative; No changes in vision or hearing, sinus congestion, difficulty swallowing Pulmonary: Negative; No cough, wheezing, shortness of breath, hemoptysis Cardiovascular: See history of present illness GI: Negative; No nausea, vomiting, diarrhea, or abdominal pain GU: Negative; No dysuria, hematuria, or difficulty voiding Musculoskeletal: Osteopenia Hematologic/Oncology: Negative; no easy bruising, bleeding Endocrine: Negative; no heat/cold intolerance; no diabetes Neuro: Negative; no changes in balance, headaches Skin: Negative; No rashes or skin lesions Psychiatric: Negative; No behavioral problems, depression Sleep: Negative; No snoring, daytime sleepiness, hypersomnolence, bruxism, restless legs, hypnogognic hallucinations, no cataplexy Other comprehensive 14 point system review is negative.   PHYSICAL EXAM:   BP 138/68   Pulse (!) 49   Ht _0  (1.651 m)   Wt 108 lb 9.6 oz (49.3 kg)   BMI 18.07 kg/m   Repeat blood pressure by me was 120/68  Wt Readings from Last 3 Encounters:  06/11/17 108 lb 9.6 oz (49.3 kg)  05/08/17 108 lb 3.2 oz (49.1 kg)  03/19/17 106 lb 12.8 oz (48.4 kg)    General: Alert, oriented, no distress.  Skin: normal turgor, no rashes, warm and dry HEENT: Normocephalic, atraumatic. Pupils equal round and reactive to light; sclera anicteric; extraocular muscles intact;  Nose without nasal septal hypertrophy Mouth/Parynx benign; Mallinpatti scale 2 Neck: No JVD, no carotid bruits; normal carotid upstroke Lungs: clear to  ausculatation and percussion; no wheezing or rales Chest wall: without tenderness to palpitation Heart: PMI not displaced, RRR with rate now in the 50s on auscultation., s1 s2 normal, 1/6 systolic murmur, 2/6 diastolic murmur, no rubs, gallops, thrills, or heaves Abdomen: soft, nontender; no hepatosplenomehaly, BS+; abdominal aorta nontender and not dilated by palpation. Back: no CVA tenderness Pulses 2+ Musculoskeletal: full range of motion, normal strength, no joint deformities Extremities: no clubbing cyanosis or edema, Homan's sign negative  Neurologic: grossly nonfocal; Cranial nerves grossly wnl Psychologic: Normal mood and affect   Studies/Labs Reviewed:   ECG (independently read by me): Sinus bradycardia at 49 bpm.  PR interval 172 ms, QTc interval 410 ms.  No ST segment changes.  06/08/2017 EKG:  I personally reviewed the patient's baseline ECG:  sinus rhythm at 68 bpm.  I also personally reviewed her routine treadmill test in which she exercised to a 9.7 that workload.  In the immediate recovery period she went into rapid atrial fibrillation with a ventricular rate of 170 - 180.  There were rate related inferolateral ST segment depression changes of 10 1-1/2 mm.  I reevaluated her ECGs throughout the recovery.  After 1-1/2 hours of observation when the monitor was disconnected, her atrial fibrillation rate had reduced to 95 bpm and blood pressure was 129/88.  Recent Labs: BMP Latest Ref Rng & Units 09/14/2016 10/15/2014 04/14/2014  Glucose 70 - 99 mg/dL 87 91 96  BUN 6 - 23 mg/dL 16 20 26(H)  Creatinine 0.40 - 1.20 mg/dL 0.69 0.7 0.70  Sodium 135 - 145 mEq/L 142 141 140  Potassium 3.5 - 5.1 mEq/L 4.0 4.9 3.7  Chloride 96 - 112 mEq/L 104 107 102  CO2 19 - 32 mEq/L _1 Calcium 8.4 - 10.5 mg/dL 9.6 9.5 9.5     Hepatic Function Latest Ref Rng & Units 09/14/2016 10/15/2014 04/14/2014  Total Protein 6.0 - 8.3 g/dL 7.3 7.1 7.3  Albumin 3.5 - 5.2 g/dL 4.5 4.1 4.2  AST 0 - 37  U/L _2 ALT 0 - 35 U/L _3 Alk Phosphatase 39 -  117 U/L 42 40 42  Total Bilirubin 0.2 - 1.2 mg/dL 0.8 0.6 0.5  Bilirubin, Direct 0.0 - 0.3 mg/dL 0.1 0.0 -    CBC Latest Ref Rng & Units 09/14/2016 10/15/2014 09/03/2014  WBC 4.0 - 10.5 K/uL 4.6 6.1 7.4  Hemoglobin 12.0 - 15.0 g/dL 14.6 13.7 13.6  Hematocrit 36.0 - 46.0 % 43.6 42.0 41.6  Platelets 150.0 - 400.0 K/uL 243.0 242.0 246.0   Lab Results  Component Value Date   MCV 95.3 09/14/2016   MCV 97.3 10/15/2014   MCV 94.0 09/03/2014   Lab Results  Component Value Date   TSH 1.22 09/14/2016   No results found for: HGBA1C   BNP No results found for: BNP  ProBNP No results found for: PROBNP   Lipid Panel     Component Value Date/Time   CHOL 238 (H) 09/14/2016 0805   TRIG 68.0 09/14/2016 0805   HDL 87.50 09/14/2016 0805   CHOLHDL 3 09/14/2016 0805   VLDL 13.6 09/14/2016 0805   LDLCALC 137 (H) 09/14/2016 0805   LDLDIRECT 131.0 09/03/2009 0813     RADIOLOGY: No results found.   Additional studies/ records that were reviewed today include:  I reviewed Dr. Rosezella [place] office notes and today's routine treadmill test.    ASSESSMENT:    1. Paroxysmal atrial fibrillation (HCC)   2. Nonrheumatic aortic valve insufficiency   3. Chronic anticoagulation   4. Pure hypercholesterolemia      PLAN:  [name] [name] is a 66 year old female who has a history of paroxysmal atrial fibrillation for which she was started on eliquis anticoagulation.  Since April 20178 she has at least 6 recorded episodes of AF.  Oftentimes, which have been precipitated by activity.  On her routine treadmill test on 06/08/2017.  Immediately post exercise.  She went into atrial fibrillation with rapid ventricular response.  There was no associated chest pain.  She was given metoprolol 25 mg initially, and 1 hour later received an additional 12.5 mg of metoprolol, which reduced her rate into the 80s and 90s.  Subsequent, she converted back to  sinus rhythm after proximally 2 hours.  Atrial fibrillation, duration.  Over the weekend.  She has been on metoprolol 25 mg twice a day.  Her resting pulse today is 42mon ECG, but  was in the 50s on exam.  She clearly has a murmur of  aortic insufficiency.  I suspect her atrial fibrillation was associated with a catecholamine surge associated with activity and exercise.  She continues to be on anticoagulation with eliquis.  She is asymptomatic with reference to her bradycardia.  She will continue this therapy.  However, if her pulse gets too slow and is consistently less than 50.  I have suggested that she reduce her nocturnal dose to 12.5 mg but continue taking the 25 mg in the morning.  She may ultimately be able to be transitioned to metoprolol succinate for daily dosing.  Lipid studies are elevated with an LDL of 137 noted in November 2017.  She may require initiation of lipid-lowering treatment.  She will follow-up with Dr. HPercival Spanish  Medication Adjustments/Labs and Tests Ordered: Current medicines are reviewed at length with the patient today.  Concerns regarding medicines are outlined above.  Medication changes, Labs and Tests ordered today are listed in the Patient Instructions below.  Patient Instructions  No changes were made today in your therapy. Keep appointment scheduled with Dr HPercival Spanishon 06/29/17 as planned.    Signed, TShelva Majestic MD  06/13/2017 8:44 PM    [facility] [address] [place], [place], [place]  [address] Phone: [phone]

## 2017-06-11 NOTE — Progress Notes (Addendum)
Cardiology Office Note    Date:  06/11/2017   ID:  [name] [name], DOB [birth year], MRN [id number]  PCP:  Burnis Medin, MD  Cardiologist:  Shelva Majestic, MD   DOD add on: AF on GXT today  History of Present Illness:  [name] [name] is a 66 y.o. female patient who is followed by Dr. Percival [name].  She is seen in the office today after she developed atrial fibrillation during her routine treadmill test in the office today which has continued.  [name] [name] has a history of palpitations and aortic insufficiency.  She had worn an event monitor which demonstrated paroxysmal atrial fibrillation.  Oftentimes she was noticing episodes of atrial fibrillation on Saturdas when she would exercise.  She had been given a prescription for propranolol and was advised to only take 10 mg.  Usually, this would not result in restoration of sinus rhythm and she would take and she will take an additional pill.  At times it would take up to 10 hours for restoration of sinus rhythm and she would notice that she would be back in the normal rhythm the following morning after sleeping.  The patient had undergone a prior echo Doppler study in October 2017 which showed normal systolic function with grade 2 diastolic dysfunction and mild to moderate aortic insufficiency.  [name] was referred by Dr. Percival [name] for routine treadmill test today.  Her baseline ECG showed sinus rhythm at 68 bpm.  Resting blood pressure was 150/74.  She exercised for 7 minutes and 48 seconds into the standard Bruce protocol.  Immediately after cessation of exercise, on the ECG taken at 14 seconds into recovery, she flipped into atrial fibrillation with rapid ventricular response.  This was associated with mild ST-T changes.  She did not have any chest pain.  At 555.  Into recovery.  There was a ventricular couplet and isolated PVC.  She continued to have 1 mm inferolateral ST segment changes.  I was notified that she had gone into atrial fibrillation.  I went  to the treadmill room and examined the patient.  With her persistent AF, even after 20 minutes following exercis her ventricular rate was in the 130s to 140s..  I recommended an initial metoprolol tartrate 25 mg given orally, which was administered at at 4:35 PM.   After approximaltey 30 minutes, ventricular rate was still in the 120s.  At close to 1 hour  ventricular rate had slowed but she was still in the 100-110 range.  I had advised that the patient not drive home and had spoken to her husband who ultimately came to the office.  The patient was given an additional metoprolol 12.5 mg one hour after initial 25 mg dose.  She was monitored for another 30 minutes after this interventricular rate slowed to the 95-100 range.  She was not having any symptoms of chest pain.  She is on anticoagulation already with eliquis  I advised that when she goes home that she take the remaining 12.5 mg for total dose this evening of 50 mg and then initiate 25 mg twice a day.  Past Medical History:  Diagnosis Date  . A-fib ([place])   . Allergic rhinitis   . Aortic insufficiency   . CAT SCRATCH 09/10/2009   Qualifier: Diagnosis of  By: Regis Bill MD, [name]   . CHEST WALL PAIN, ACUTE 07/21/2010   Qualifier: Diagnosis of  By: Sarajane Jews MD, Ishmael Holter   . Clavicle fracture 3/08  left  . Diverticulosis   . Heart palpitations   . HTN (hypertension)   . Osteopenia   . Vision disturbance 03/13/2013   Negative neuro MRA MRI    Past Surgical History:  Procedure Laterality Date  . ABDOMINAL HYSTERECTOMY     pt denies 04/15/14  . APPENDECTOMY    . LAPAROSCOPIC SALPINGO OOPHERECTOMY Right   . TONSILLECTOMY AND ADENOIDECTOMY      Current Medications: Outpatient Medications Prior to Visit  Medication Sig Dispense Refill  . acetaminophen (TYLENOL) 500 MG tablet Take 500 mg by mouth as needed.    [name] alendronate (FOSAMAX) 70 MG tablet Take 1 tablet (70 mg total) by mouth once a week. Take with a full glass of water on an empty  stomach. 12 tablet 1  . Calcium Carbonate-Vitamin D (CALCIUM PLUS VITAMIN D PO) Take 1 tablet by mouth daily.     . Cholecalciferol (VITAMIN D3) 1000 units CAPS Take 2 capsules by mouth daily.     [name] diltiazem (CARTIA XT) 300 MG 24 hr capsule Take 1 capsule (300 mg total) by mouth daily. 90 capsule 3  . ELIQUIS 5 MG TABS tablet TAKE 1 TABLET BY MOUTH TWICE DAILY 180 tablet 1  . ibuprofen (ADVIL,MOTRIN) 800 MG tablet Take 800 mg by mouth 2 (two) times daily as needed for moderate pain.     [name] LORazepam (ATIVAN) 0.5 MG tablet Take 1 tablet (0.5 mg total) by mouth 3 (three) times daily as needed for anxiety. 20 tablet 0  . montelukast (SINGULAIR) 10 MG tablet TAKE 1 TABLET BY MOUTH DAILY 90 tablet 1  . Multiple Vitamins-Minerals (PRESERVISION AREDS PO) Take 1 tablet by mouth 2 (two) times daily.     . propranolol (INDERAL) 10 MG tablet Take 1 tablet (10 mg total) by mouth every 4 (four) hours as needed. 120 tablet 6   No facility-administered medications prior to visit.      Allergies:   Codeine phosphate; Demerol [meperidine]; Meperidine hcl; and Sulfa antibiotics   Social History   Social History  . Marital status: Married    Spouse name: N/A  . Number of children: 0  . Years of education: N/A   Occupational History  . 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 Smoking status: Never Smoker  . Smokeless tobacco: Never Used  . Alcohol use No     Comment: never  . Drug use: No  . Sexual activity: Not on file   Other Topics Concern  . Not on file   Social History Narrative   Married   hh of 2    p0g0   Works for [employer]      Family History:  The patient's family history includes Coronary artery disease (age of onset: 89) in her father; Esophageal cancer in her paternal grandmother; Hyperlipidemia in her mother; Scleroderma in her mother.   ROS General: Negative; No fevers, chills, or night sweats;  HEENT: Negative; No changes in  vision or hearing, sinus congestion, difficulty swallowing Pulmonary: Negative; No cough, wheezing, shortness of breath, hemoptysis Cardiovascular: See history of present illness GI: Negative; No nausea, vomiting, diarrhea, or abdominal pain GU: Negative; No dysuria, hematuria, or difficulty voiding Musculoskeletal: Osteopenia Hematologic/Oncology: Negative; no easy bruising, bleeding Endocrine: Negative; no heat/cold intolerance; no diabetes Neuro: Negative; no changes in balance, headaches Skin: Negative; No rashes or skin lesions Psychiatric: Negative; No behavioral problems, depression Sleep: Negative; No snoring, daytime sleepiness, hypersomnolence,  bruxism, restless legs, hypnogognic hallucinations, no cataplexy Other comprehensive 14 point system review is negative.   PHYSICAL EXAM:   VS:  Immediately post stress, she was in atrial fibrillation with increased ventricular rate of 171 and blood pressure 136/102.   Wt Readings from Last 3 Encounters:  05/08/17 108 lb 3.2 oz (49.1 kg)  03/19/17 106 lb 12.8 oz (48.4 kg)  02/06/17 108 lb 6.4 oz (49.2 kg)    General: Alert, oriented, no distress.  Skin: normal turgor, no rashes, warm and dry HEENT: Normocephalic, atraumatic. Pupils equal round and reactive to light; sclera anicteric; extraocular muscles intact;  Nose without nasal septal hypertrophy Mouth/Parynx benign; Mallinpatti scale 2 Neck: No JVD, no carotid bruits; normal carotid upstroke Lungs: clear to ausculatation and percussion; no wheezing or rales Chest wall: without tenderness to palpitation Heart: PMI not displaced, irregular regular rhythm with a  rapid ventricular rate s1 s2 normal, 1/6 systolic murmur, 1-2  diastolic murmur, no rubs, gallops, thrills, or heaves Abdomen: soft, nontender; no hepatosplenomehaly, BS+; abdominal aorta nontender and not dilated by palpation. Back: no CVA tenderness Pulses 2+ Musculoskeletal: full range of motion, normal strength, no  joint deformities Extremities: no clubbing cyanosis or edema, Homan's sign negative  Neurologic: grossly nonfocal; Cranial nerves grossly wnl Psychologic: Normal mood and affect   Studies/Labs Reviewed:   EKG:  I personally reviewed the patient's baseline ECG shows sinus rhythm at 68 bpm.  I also personally reviewed her routine treadmill test in which she exercised to a 9.7 that workload.  In the immediate recovery period she went into rapid atrial fibrillation with a ventricular rate of 170 - 180.  There were rate related inferolateral ST segment depression changes of 10 1-1/2 mm.  I reevaluated her ECGs throughout the recovery.  After 1-1/2 hours of observation when the monitor was disconnected, her atrial fibrillation rate had reduced to 95 bpm and blood pressure was 129/88.  Recent Labs: BMP Latest Ref Rng & Units 09/14/2016 10/15/2014 04/14/2014  Glucose 70 - 99 mg/dL 87 91 96  BUN 6 - 23 mg/dL 16 20 26(H)  Creatinine 0.40 - 1.20 mg/dL 0.69 0.7 0.70  Sodium 135 - 145 mEq/L 142 141 140  Potassium 3.5 - 5.1 mEq/L 4.0 4.9 3.7  Chloride 96 - 112 mEq/L 104 107 102  CO2 19 - 32 mEq/L _0 Calcium 8.4 - 10.5 mg/dL 9.6 9.5 9.5     Hepatic Function Latest Ref Rng & Units 09/14/2016 10/15/2014 04/14/2014  Total Protein 6.0 - 8.3 g/dL 7.3 7.1 7.3  Albumin 3.5 - 5.2 g/dL 4.5 4.1 4.2  AST 0 - 37 U/L _1 ALT 0 - 35 U/L _2 Alk Phosphatase 39 - 117 U/L 42 40 42  Total Bilirubin 0.2 - 1.2 mg/dL 0.8 0.6 0.5  Bilirubin, Direct 0.0 - 0.3 mg/dL 0.1 0.0 -    CBC Latest Ref Rng & Units 09/14/2016 10/15/2014 09/03/2014  WBC 4.0 - 10.5 K/uL 4.6 6.1 7.4  Hemoglobin 12.0 - 15.0 g/dL 14.6 13.7 13.6  Hematocrit 36.0 - 46.0 % 43.6 42.0 41.6  Platelets 150.0 - 400.0 K/uL 243.0 242.0 246.0   Lab Results  Component Value Date   MCV 95.3 09/14/2016   MCV 97.3 10/15/2014   MCV 94.0 09/03/2014   Lab Results  Component Value Date   TSH 1.22 09/14/2016   No results found for:  HGBA1C   BNP No results found for: BNP  ProBNP No results found for:  PROBNP   Lipid Panel     Component Value Date/Time   CHOL 238 (H) 09/14/2016 0805   TRIG 68.0 09/14/2016 0805   HDL 87.50 09/14/2016 0805   CHOLHDL 3 09/14/2016 0805   VLDL 13.6 09/14/2016 0805   LDLCALC 137 (H) 09/14/2016 0805   LDLDIRECT 131.0 09/03/2009 0813     RADIOLOGY: No results found.   Additional studies/ records that were reviewed today include:  I reviewed Dr. Rosezella Florida office notes and today's routine treadmill test.    ASSESSMENT:    1. Paroxysmal atrial fibrillation (HCC) :Exercise-induced during treadmill testing done in the office today   2. Essential hypertension   3. Chronic anticoagulation   4. Nonrheumatic aortic valve insufficiency      PLAN:  Ms. Maddyn Lieurance is a 66 year old female who has a history of paroxysmal atrial fibrillation for which she was started on eloquence anticoagulation.  Since April.  She has at least 6 recorded episodes of AF.  Oftentimes, which have been precipitated by activity.  On today's routine treadmill test.  She did not develop any chest pain but immediately after exercise at 14 seconds into recovery, she transitioned into atrial fibrillation with rapid ventricular rate.  Her atrial fibrillation persists cyst.  Initially she was significant hypertensive.  In the office over an hour and half.  She received 37.5 mg of oral metoprolo tartrate.  Throughout this recovery period, she remained chest pain free.  She did have some anxiety.  Her ventricular rate improved with initiation of metoprolol but did not resolve.  She also has an Alive Cor monitoring system.  Monitoring of her rhythm with the phone system was utilized  in the office prior to her leaving, so that she would feel comfortable reassessing her heart rhythm this evening upon going home.  During her evaluation, I spent approximately  50 - 60 minutes face-to-face with the patient and her husband.  She  will take the additional 12.5 mg of metoprolol when she arrives home and then start 25 mg twice a day.  I suspect she will need chronic beta blocker therapy, particular since her symptoms seem to be precipitated by exertion and maybe catecholamine driven.  I had advised a very early follow-up for Monday, but no one was available to see the patient.  As result, I will see the patient this Monday, August 13 for follow-up evaluation.   Medication Adjustments/Labs and Tests Ordered: Current medicines are reviewed at length with the patient today.  Concerns regarding medicines are outlined above.  Medication changes, Labs and Tests ordered today are listed in the Patient Instructions below. Patient Instructions  Medication Instructions:  START metoprolol 72m (1 tablet) two times daily  Follow-Up: Monday 8/13 at 3:20 with Dr. KClaiborne Billings  Any Other Special Instructions Will Be Listed Below (If Applicable).     If you need a refill on your cardiac medications before your next appointment, please call your pharmacy.      Signed, TShelva Majestic MD  06/11/2017 8:25 AM    [facility] [address] [place]  [id number]: [phone]

## 2017-06-28 NOTE — Progress Notes (Signed)
HPI The patient presents for follow up of palpitations and aortic insufficiency. She wore an event monitor which demonstrated paroxysmal atrial fibrillation. She had an echo and had mild to moderate AI/MR.  I sent her for a POET (in anticipation that she would need flecainide.)   ([place] Exercise Treadmill) and she had atrial fib during recovery.  She was cared for by Dr. Claiborne Billings and was given bradycardia.  She had return to NSR later in the evening.     She has since been on beta blocker metoprolol and stop her propranolol when necessary. She is on Cardizem as well. She's actually had no breakthroughs. Of interest she has had some slow heart rhythms and she got and Alive Cor which shows heart rates in the 50s and 40s sinus bradycardia but she's not having any symptoms with this so she seems to be tolerating this regimen well. She's not had any chest pressure, neck or arm discomfort. She has not had any presyncope or syncope.  Allergies  Allergen Reactions  . Codeine Phosphate     drowsy  . Demerol [Meperidine] Nausea Only  . Meperidine Hcl Nausea Only  . Sulfa Antibiotics     Rash    Current Outpatient Prescriptions  Medication Sig Dispense Refill  . acetaminophen (TYLENOL) 500 MG tablet Take 500 mg by mouth as needed.    [name] alendronate (FOSAMAX) 70 MG tablet Take 1 tablet (70 mg total) by mouth once a week. Take with a full glass of water on an empty stomach. 12 tablet 1  . Calcium Carbonate-Vitamin D (CALCIUM PLUS VITAMIN D PO) Take 1 tablet by mouth daily.     . Cholecalciferol (VITAMIN D3) 1000 units CAPS Take 2 capsules by mouth daily.     [name] diltiazem (CARTIA XT) 300 MG 24 hr capsule Take 1 capsule (300 mg total) by mouth daily. 90 capsule 3  . ELIQUIS 5 MG TABS tablet TAKE 1 TABLET BY MOUTH TWICE DAILY 180 tablet 1  . ibuprofen (ADVIL,MOTRIN) 800 MG tablet Take 800 mg by mouth 2 (two) times daily as needed for moderate pain.     [name] LORazepam (ATIVAN) 0.5 MG tablet Take 1 tablet  (0.5 mg total) by mouth 3 (three) times daily as needed for anxiety. 20 tablet 0  . metoprolol tartrate (LOPRESSOR) 25 MG tablet Take 1 tablet (25 mg total) by mouth 2 (two) times daily. 180 tablet 3  . montelukast (SINGULAIR) 10 MG tablet TAKE 1 TABLET BY MOUTH DAILY 90 tablet 1  . Multiple Vitamins-Minerals (PRESERVISION AREDS PO) Take 1 tablet by mouth 2 (two) times daily.     . propranolol (INDERAL) 10 MG tablet Take 10 mg by mouth as needed.     No current facility-administered medications for this visit.     Past Medical History:  Diagnosis Date  . A-fib ([place])   . Allergic rhinitis   . Aortic insufficiency   . CAT SCRATCH 09/10/2009   Qualifier: Diagnosis of  By: Regis Bill MD, [name]   . CHEST WALL PAIN, ACUTE 07/21/2010   Qualifier: Diagnosis of  By: Sarajane Jews MD, Ishmael Holter   . Clavicle fracture 3/08   left  . Diverticulosis   . Heart palpitations   . HTN (hypertension)   . Osteopenia   . Vision disturbance 03/13/2013   Negative neuro MRA MRI    Past Surgical History:  Procedure Laterality Date  . ABDOMINAL HYSTERECTOMY     pt denies 04/15/14  . APPENDECTOMY    .  LAPAROSCOPIC SALPINGO OOPHERECTOMY Right   . TONSILLECTOMY AND ADENOIDECTOMY      ROS: As stated in the HPI and negative for all other systems.      PHYSICAL EXAM BP 140/68   Pulse (!) 53   Ht 5\' 5"  (1.651 m)   Wt 108 lb (49 kg)   BMI 17.97 kg/m   GENERAL:  Well appearing NECK:  No jugular venous distention, waveform within normal limits, carotid upstroke brisk and symmetric, no bruits, no thyromegaly LUNGS:  Clear to auscultation bilaterally CHEST:  Unremarkable HEART:  PMI not displaced or sustained,S1 and S2 within normal limits, no S3, no S4, no clicks, no rubs, no murmurs ABD:  Flat, positive bowel sounds normal in frequency in pitch, no bruits, no rebound, no guarding, no midline pulsatile mass, no hepatomegaly, no splenomegaly EXT:  2 plus pulses throughout, no edema, no cyanosis no  clubbing   ASSESSMENT AND PLAN  Aortic insufficiency -  She had mild to moderate MR/AI I will schedule a follow up echo in Oct for this.   Atrial fib-  Ms. Yuritzi D Whitsell has a CHA2DS2 - VASc score of 2.  We had another long discussion.  Since the current meds are working she will stay on the metoprolol and the Cardizem and let me know if she has break through episodes.    HTN - The blood pressure is at target. No change in medications is indicated. We will continue with therapeutic lifestyle changes (TLC).

## 2017-06-29 ENCOUNTER — Encounter: Payer: Self-pay | Admitting: Cardiology

## 2017-06-29 ENCOUNTER — Ambulatory Visit (INDEPENDENT_AMBULATORY_CARE_PROVIDER_SITE_OTHER): Payer: BLUE CROSS/BLUE SHIELD | Admitting: Cardiology

## 2017-06-29 VITALS — BP 140/68 | HR 53 | Ht 65.0 in | Wt 108.0 lb

## 2017-06-29 DIAGNOSIS — I480 Paroxysmal atrial fibrillation: Secondary | ICD-10-CM | POA: Diagnosis not present

## 2017-06-29 DIAGNOSIS — I10 Essential (primary) hypertension: Secondary | ICD-10-CM

## 2017-06-29 DIAGNOSIS — I351 Nonrheumatic aortic (valve) insufficiency: Secondary | ICD-10-CM | POA: Diagnosis not present

## 2017-06-29 NOTE — Patient Instructions (Signed)
Medication Instructions:  Continue current medications  If you need a refill on your cardiac medications before your next appointment, please call your pharmacy.  Labwork: None Ordered  Testing/Procedures: Your physician has requested that you have an echocardiogram in October. Echocardiography is a painless test that uses sound waves to create images of your heart. It provides your doctor with information about the size and shape of your heart and how well your heart's chambers and valves are working. This procedure takes approximately one hour. There are no restrictions for this procedure.  Follow-Up: Your physician wants you to follow-up in: October.    Thank you for choosing [facility] at [facility]!!

## 2017-06-30 ENCOUNTER — Encounter: Payer: Self-pay | Admitting: Cardiology

## 2017-06-30 DIAGNOSIS — I10 Essential (primary) hypertension: Secondary | ICD-10-CM | POA: Insufficient documentation

## 2017-07-06 ENCOUNTER — Telehealth: Payer: Self-pay | Admitting: Cardiology

## 2017-07-06 NOTE — Telephone Encounter (Signed)
New message   Pt c/o medication issue:  1. Name of Medication: metoprolol tartrate (LOPRESSOR) 25 MG tablet  2. How are you currently taking this medication (dosage and times per day)? 25MG   3. Are you having a reaction (difficulty breathing--STAT)? no  4. What is your medication issue? Pt states she has Afib and she has taken half a pill and wants to know what she should do as far as having afib and taking this medication

## 2017-07-06 NOTE — Telephone Encounter (Signed)
[id number] 

## 2017-07-09 LAB — HM MAMMOGRAPHY

## 2017-07-11 ENCOUNTER — Encounter: Payer: Self-pay | Admitting: Internal Medicine

## 2017-07-11 NOTE — Telephone Encounter (Signed)
[id number] 

## 2017-07-19 NOTE — Telephone Encounter (Signed)
Left message for pt to call.

## 2017-07-20 ENCOUNTER — Encounter: Payer: Self-pay | Admitting: Internal Medicine

## 2017-07-27 ENCOUNTER — Telehealth: Payer: Self-pay | Admitting: Cardiology

## 2017-07-27 NOTE — Telephone Encounter (Signed)
Pease call,pt have been having numbness in her left thigh.She wonder if this is something she needs to be concerned about,

## 2017-07-27 NOTE — Telephone Encounter (Signed)
Returned call to patient she stated she has been having numbness in left thigh off and on for the past 1 week.Stated no pain in left thigh, just numbness.Stated walking seems to help numbness.Advised I will send message to Dr.Hochrein for advice.

## 2017-07-28 NOTE — Telephone Encounter (Signed)
I would ask her to talk with her primary provider .

## 2017-07-29 ENCOUNTER — Other Ambulatory Visit: Payer: Self-pay | Admitting: Cardiology

## 2017-07-30 ENCOUNTER — Ambulatory Visit (INDEPENDENT_AMBULATORY_CARE_PROVIDER_SITE_OTHER): Payer: BLUE CROSS/BLUE SHIELD | Admitting: Internal Medicine

## 2017-07-30 ENCOUNTER — Encounter: Payer: Self-pay | Admitting: Internal Medicine

## 2017-07-30 VITALS — BP 138/62 | HR 56 | Temp 98.1°F | Ht 65.0 in | Wt 109.0 lb

## 2017-07-30 DIAGNOSIS — I351 Nonrheumatic aortic (valve) insufficiency: Secondary | ICD-10-CM

## 2017-07-30 DIAGNOSIS — I480 Paroxysmal atrial fibrillation: Secondary | ICD-10-CM | POA: Diagnosis not present

## 2017-07-30 DIAGNOSIS — Z23 Encounter for immunization: Secondary | ICD-10-CM | POA: Diagnosis not present

## 2017-07-30 DIAGNOSIS — R200 Anesthesia of skin: Secondary | ICD-10-CM | POA: Diagnosis not present

## 2017-07-30 NOTE — Patient Instructions (Addendum)
This acts like  Problem with lateral femoral cutaneous nerve area of leg   And dont see any other alarming features .   For now avoid  Binding  Clothes around the  Groin area   .   If   Persisting  After another 2-4 weeks or worsening or  Weakness then we can get   Neurology  Opinion.

## 2017-07-30 NOTE — Telephone Encounter (Signed)
REFILL 

## 2017-07-30 NOTE — Progress Notes (Signed)
Chief Complaint  Patient presents with  . Acute Visit  . Numbness    In her left leg, sometimes when she stands for a long period of time, per pt as of today it has subsided some    HPI: [name] [name] 65 y.o.  sda   With numbness  Ofleft lateral  thigh   On going  Cardiology  Advised disc with pcp  Onset about  10 days ago  .Friday 21  Not an event   And 2 days ago was bothering her standing at a function.  Aggravated long period of time or standings and then walks around to diminsh . Laying down decreases or resolves it   No trauma or pain or weakness   Exercise 2 x per week.    Now   Now on metoprolol  For a fib.  zumba ok.    Waxing and waning  Better with laying down sleep.   .    May have had  Minor sx  Some years ago and then went away .  ROS: See pertinent positives and negatives per HPI. Ngest afib ocass  Still on anticoagulation  Past Medical History:  Diagnosis Date  . A-fib ([place])   . Allergic rhinitis   . Aortic insufficiency   . CAT SCRATCH 09/10/2009   Qualifier: Diagnosis of  By: Regis Bill MD, [name]   . CHEST WALL PAIN, ACUTE 07/21/2010   Qualifier: Diagnosis of  By: Sarajane Jews MD, Ishmael Holter   . Clavicle fracture 3/08   left  . Diverticulosis   . Heart palpitations   . HTN (hypertension)   . Osteopenia   . Vision disturbance 03/13/2013   Negative neuro MRA MRI    Family History  Problem Relation Age of Onset  . Scleroderma Mother   . Hyperlipidemia Mother   . Coronary artery disease Father 82  . Esophageal cancer Paternal Grandmother     Social History   Social History  . Marital status: Married    Spouse name: N/A  . Number of children: 0  . Years of education: N/A   Occupational History  . 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 Smoking status: Never Smoker  . Smokeless tobacco: Never Used  . Alcohol use No     Comment: never  . Drug use: No  . Sexual activity: Not Asked   Other Topics Concern  . None     Social History Narrative   Married   hh of 2    p0g0   Works for [employer]     Outpatient Medications Prior to Visit  Medication Sig Dispense Refill  . acetaminophen (TYLENOL) 500 MG tablet Take 500 mg by mouth as needed.    [name] alendronate (FOSAMAX) 70 MG tablet Take 1 tablet (70 mg total) by mouth once a week. Take with a full glass of water on an empty stomach. 12 tablet 1  . Calcium Carbonate-Vitamin D (CALCIUM PLUS VITAMIN D PO) Take 1 tablet by mouth daily.     [name] CARTIA XT 300 MG 24 hr capsule TAKE 1 CAPSULE DAILY 90 capsule 3  . Cholecalciferol (VITAMIN D3) 1000 units CAPS Take 2 capsules by mouth daily.     [name] ELIQUIS 5 MG TABS tablet TAKE 1 TABLET BY MOUTH TWICE DAILY 180 tablet 1  . ibuprofen (ADVIL,MOTRIN) 800 MG tablet Take 800 mg by mouth 2 (two) times daily as needed for  moderate pain.     [name] LORazepam (ATIVAN) 0.5 MG tablet Take 1 tablet (0.5 mg total) by mouth 3 (three) times daily as needed for anxiety. 20 tablet 0  . metoprolol tartrate (LOPRESSOR) 25 MG tablet Take 1 tablet (25 mg total) by mouth 2 (two) times daily. 180 tablet 3  . montelukast (SINGULAIR) 10 MG tablet TAKE 1 TABLET BY MOUTH DAILY 90 tablet 1  . Multiple Vitamins-Minerals (PRESERVISION AREDS PO) Take 1 tablet by mouth 2 (two) times daily.     . propranolol (INDERAL) 10 MG tablet Take 10 mg by mouth as needed.     No facility-administered medications prior to visit.      EXAM:  BP 138/62 (BP Location: Right Arm, Patient Position: Sitting, Cuff Size: Normal)   Pulse (!) 56   Temp 98.1 F (36.7 C) (Oral)   Ht 5\' 5"  (1.651 m)   Wt 109 lb (49.4 kg)   SpO2 98%   BMI 18.14 kg/m   Body mass index is 18.14 kg/m.  GENERAL: vitals reviewed and listed above, alert, oriented, appears well hydrated and in no acute distress HEENT: atraumatic, conjunctiva  clear, no obvious abnormalities on inspection of external nose and ears  NECK: no obvious masses on inspection palpation  LUNGS: clear to  auscultation bilaterally, no wheezes, rales or rhonchi, good air movement CV: HRRR,  s1 s2 diastolic m 2/6 no clubbing cyanosis or  peripheral edema nl cap refill  abd soft no masses inguinal area nl no masses  MS: moves all extremities without noticeable focal  Abnormality nl rom skin nl no atrophy   Neuro intact except lateral distal; thigh to knee  Dec sensation subjective  Good rom  dts intact  Motor nl nl rom hiip.  PSYCH: pleasant and cooperative, no obvious depression or anxiety  ASSESSMENT AND PLAN:  Discussed the following assessment and plan:  Numbness  Need for prophylactic vaccination and inoculation against influenza - Plan: Flu Vaccine QUAD 6+ mos PF IM (Fluarix Quad PF)  Aortic valve insufficiency, etiology of cardiac valve disease unspecified  Paroxysmal atrial fibrillation (HCC) Symptoms of left lateral thigh in the distribution of the cutaneous lateral nerve but no pain atrophy obvious risk factors and no evidence of systemic neuropathy. Since it is relieved lying down she goes to bed at night will work on wearing loose close continue activity if ongoing and persistent or getting alarm symptoms get neurology input but this very much acts like a peripheral dermatomal problem. Alarm symptoms discussed expectant management. Can get flu vaccine today discussed she works and TD booster -Patient advised to return or notify health care team  if symptoms worsen ,persist or new concerns arise.  Patient Instructions  This acts like  Problem with lateral femoral cutaneous nerve area of leg   And dont see any other alarming features .   For now avoid  Binding  Clothes around the  Groin area   .   If   Persisting  After another 2-4 weeks or worsening or  Weakness then we can get   Neurology  Opinion.            [name]. [name] M.D.

## 2017-07-30 NOTE — Telephone Encounter (Signed)
Returned call to patient Dr.Hochrein's recommendations given.

## 2017-08-14 ENCOUNTER — Other Ambulatory Visit (HOSPITAL_COMMUNITY): Payer: Self-pay

## 2017-08-15 ENCOUNTER — Other Ambulatory Visit: Payer: Self-pay | Admitting: Internal Medicine

## 2017-08-17 ENCOUNTER — Ambulatory Visit (HOSPITAL_COMMUNITY): Payer: BLUE CROSS/BLUE SHIELD | Attending: Cardiovascular Disease

## 2017-08-17 ENCOUNTER — Other Ambulatory Visit: Payer: Self-pay

## 2017-08-17 DIAGNOSIS — R002 Palpitations: Secondary | ICD-10-CM | POA: Diagnosis not present

## 2017-08-17 DIAGNOSIS — I119 Hypertensive heart disease without heart failure: Secondary | ICD-10-CM | POA: Diagnosis not present

## 2017-08-17 DIAGNOSIS — I4891 Unspecified atrial fibrillation: Secondary | ICD-10-CM | POA: Insufficient documentation

## 2017-08-17 DIAGNOSIS — I351 Nonrheumatic aortic (valve) insufficiency: Secondary | ICD-10-CM | POA: Diagnosis present

## 2017-08-23 NOTE — Progress Notes (Signed)
HPI The patient presents for follow up of palpitations and aortic insufficiency. She has paroxysmal atrial fibrillation. She has moderate AI.  I sent her for a POET (in anticipation that she would need flecainide.)   ([employer] Exercise Treadmill) and she had atrial fib during recovery.  She was cared for by Dr. Claiborne Billings and was given Cardizem.  She had return to [facility] later in the evening.   She does have an AliveCor to track her HR now.  She has had 3 episodes of atrial fibrillation since I saw her.  They might last for up to 8 hours.  She has taken some extra metoprolol and eventually she converted to sinus rhythm.  She has had no chest discomfort, neck or arm discomfort.  She had no new shortness of breath, PND or orthopnea.  She has had no weight gain or edema.   Allergies  Allergen Reactions  . Codeine Phosphate     drowsy  . Demerol [Meperidine] Nausea Only  . Meperidine Hcl Nausea Only  . Sulfa Antibiotics     Rash    Current Outpatient Prescriptions  Medication Sig Dispense Refill  . acetaminophen (TYLENOL) 500 MG tablet Take 500 mg by mouth as needed.    [name] alendronate (FOSAMAX) 70 MG tablet TAKE 1 TABLET ONCE A WEEK WITH A FULL GLASS OF WATER ON AN EMPTY STOMACH 12 tablet 1  . Calcium Carbonate-Vitamin D (CALCIUM PLUS VITAMIN D PO) Take 1 tablet by mouth daily.     [name] CARTIA XT 300 MG 24 hr capsule TAKE 1 CAPSULE DAILY 90 capsule 3  . Cholecalciferol (VITAMIN D3) 1000 units CAPS Take 2 capsules by mouth daily.     [name] ELIQUIS 5 MG TABS tablet TAKE 1 TABLET BY MOUTH TWICE DAILY 180 tablet 1  . ibuprofen (ADVIL,MOTRIN) 800 MG tablet Take 800 mg by mouth 2 (two) times daily as needed for moderate pain.     [name] LORazepam (ATIVAN) 0.5 MG tablet Take 1 tablet (0.5 mg total) by mouth 3 (three) times daily as needed for anxiety. 20 tablet 0  . metoprolol tartrate (LOPRESSOR) 25 MG tablet Take 1 tablet (25 mg total) by mouth 2 (two) times daily. 180 tablet 3  . montelukast (SINGULAIR) 10 MG  tablet TAKE 1 TABLET BY MOUTH DAILY 90 tablet 1  . Multiple Vitamins-Minerals (PRESERVISION AREDS PO) Take 1 tablet by mouth 2 (two) times daily.      No current facility-administered medications for this visit.     Past Medical History:  Diagnosis Date  . A-fib ([place])   . Allergic rhinitis   . Aortic insufficiency   . CAT SCRATCH 09/10/2009   Qualifier: Diagnosis of  By: Regis Bill MD, [name]   . CHEST WALL PAIN, ACUTE 07/21/2010   Qualifier: Diagnosis of  By: Sarajane Jews MD, Ishmael Holter   . Clavicle fracture 3/08   left  . Diverticulosis   . Heart palpitations   . HTN (hypertension)   . Osteopenia   . Vision disturbance 03/13/2013   Negative neuro MRA MRI    Past Surgical History:  Procedure Laterality Date  . ABDOMINAL HYSTERECTOMY     pt denies 04/15/14  . APPENDECTOMY    . LAPAROSCOPIC SALPINGO OOPHERECTOMY Right   . TONSILLECTOMY AND ADENOIDECTOMY      ROS: As stated in the HPI and negative for all other systems.  PHYSICAL EXAM BP 124/64   Pulse (!) 59   Ht 5\' 5"  (1.651 m)   Wt  109 lb (49.4 kg)   SpO2 97%   BMI 18.14 kg/m   GENERAL:  Well appearing NECK:  No jugular venous distention, waveform within normal limits, carotid upstroke brisk and symmetric, no bruits, no thyromegaly LUNGS:  Clear to auscultation bilaterally CHEST:  Unremarkable HEART:  PMI not displaced or sustained,S1 and S2 within normal limits, no S3, no S4, no clicks, no rubs, no murmurs ABD:  Flat, positive bowel sounds normal in frequency in pitch, no bruits, no rebound, no guarding, no midline pulsatile mass, no hepatomegaly, no splenomegaly EXT:  2 plus pulses throughout, no edema, no cyanosis no clubbing   EKG:  NA   ASSESSMENT AND PLAN  Aortic insufficiency -  This was moderate on the recent echo.  I will follow with repeat echo in 12 months.    Atrial fib-  [name] has a CHA2DS2 - VASc score of 2.  We again discussed this and she does not think the symptoms are significant enough to  warrant treatment with flecainide.  She will use the as needed beta-blocker and we discussed this.  She continues with anticoagulation.   HTN - The blood pressure is at target.  No change in therapy.

## 2017-08-24 ENCOUNTER — Encounter: Payer: Self-pay | Admitting: Cardiology

## 2017-08-24 ENCOUNTER — Ambulatory Visit (INDEPENDENT_AMBULATORY_CARE_PROVIDER_SITE_OTHER): Payer: BLUE CROSS/BLUE SHIELD | Admitting: Cardiology

## 2017-08-24 VITALS — BP 124/64 | HR 59 | Ht 65.0 in | Wt 109.0 lb

## 2017-08-24 DIAGNOSIS — I10 Essential (primary) hypertension: Secondary | ICD-10-CM | POA: Diagnosis not present

## 2017-08-24 DIAGNOSIS — I480 Paroxysmal atrial fibrillation: Secondary | ICD-10-CM | POA: Diagnosis not present

## 2017-08-24 DIAGNOSIS — I351 Nonrheumatic aortic (valve) insufficiency: Secondary | ICD-10-CM

## 2017-08-24 NOTE — Patient Instructions (Signed)
Medication Instructions:  Continue current medications  If you need a refill on your cardiac medications before your next appointment, please call your pharmacy.  Labwork: None Ordered   Testing/Procedures: None Ordered  Special Instructions: Flecainide  Follow-Up: Your physician wants you to follow-up in: 6 Months. You should receive a reminder letter in the mail two months in advance. If you do not receive a letter, please call our office [phone].    Thank you for choosing [facility] at [facility]!!

## 2017-09-02 ENCOUNTER — Telehealth: Payer: Self-pay | Admitting: Cardiology

## 2017-09-02 ENCOUNTER — Telehealth: Payer: Self-pay | Admitting: Internal Medicine

## 2017-09-02 NOTE — Telephone Encounter (Signed)
Patient called reporting she thinks she is in Afib. HR was 119, in the airport planning to board a flight. States she was not symptomatic. Asking for direction. Advised not to board plane at this time, and ok to take extra dose of metoprolol to reduce HR. Patient planning to adjust to a later flight home. Already on Eliquis per office notes, and she reports being compliant with medications. Will call back if needs further assistance.

## 2017-09-02 NOTE — Telephone Encounter (Signed)
[name] [name] about  Her episode of  Atrial fib, sensation of palpitations  While she  Was at  [place],  At  The  Time  Of  My conversatio  With  Her,  Her  Symptoms had  Significantly abated.  She  Was  Planning on  Taking  An  Extra  Dose of  Metoprolol  , which  She  Does  As  A  Usual plan  When  Episodes of afib  Occur. She  Plans  To call in AM  TO GET  AN  EMAIL OR  LETTER  FOR  CLEARANCE TO TAKE  AN MORNING  FLIGHT   Her  Office  Cardiologist  Dr Percival Spanish is  Informed  About the  [place].

## 2017-09-03 ENCOUNTER — Telehealth: Payer: Self-pay | Admitting: *Deleted

## 2017-09-03 NOTE — Telephone Encounter (Signed)
Ok to fly as per Dr Percival Spanish, letter written and email to pt.

## 2017-09-07 ENCOUNTER — Other Ambulatory Visit: Payer: Self-pay | Admitting: Cardiology

## 2017-09-11 ENCOUNTER — Encounter (HOSPITAL_COMMUNITY): Payer: Self-pay | Admitting: Nurse Practitioner

## 2017-09-11 ENCOUNTER — Ambulatory Visit (HOSPITAL_COMMUNITY)
Admission: RE | Admit: 2017-09-11 | Discharge: 2017-09-11 | Disposition: A | Discharge status: Home / Self Care | Payer: BLUE CROSS/BLUE SHIELD | Source: Ambulatory Visit | Attending: Nurse Practitioner | Admitting: Nurse Practitioner

## 2017-09-11 VITALS — BP 152/70 | HR 58 | Ht 65.0 in | Wt 108.6 lb

## 2017-09-11 DIAGNOSIS — Z7983 Long term (current) use of bisphosphonates: Secondary | ICD-10-CM | POA: Diagnosis not present

## 2017-09-11 DIAGNOSIS — Z7901 Long term (current) use of anticoagulants: Secondary | ICD-10-CM | POA: Diagnosis not present

## 2017-09-11 DIAGNOSIS — Z8249 Family history of ischemic heart disease and other diseases of the circulatory system: Secondary | ICD-10-CM | POA: Diagnosis not present

## 2017-09-11 DIAGNOSIS — Z90721 Acquired absence of ovaries, unilateral: Secondary | ICD-10-CM | POA: Diagnosis not present

## 2017-09-11 DIAGNOSIS — Z8269 Family history of other diseases of the musculoskeletal system and connective tissue: Secondary | ICD-10-CM | POA: Insufficient documentation

## 2017-09-11 DIAGNOSIS — I4891 Unspecified atrial fibrillation: Secondary | ICD-10-CM | POA: Diagnosis present

## 2017-09-11 DIAGNOSIS — Z90710 Acquired absence of both cervix and uterus: Secondary | ICD-10-CM | POA: Diagnosis not present

## 2017-09-11 DIAGNOSIS — M8580 Other specified disorders of bone density and structure, unspecified site: Secondary | ICD-10-CM | POA: Diagnosis not present

## 2017-09-11 DIAGNOSIS — I480 Paroxysmal atrial fibrillation: Secondary | ICD-10-CM | POA: Diagnosis not present

## 2017-09-11 DIAGNOSIS — Z882 Allergy status to sulfonamides status: Secondary | ICD-10-CM | POA: Diagnosis not present

## 2017-09-11 DIAGNOSIS — Z885 Allergy status to narcotic agent status: Secondary | ICD-10-CM | POA: Diagnosis not present

## 2017-09-11 DIAGNOSIS — I10 Essential (primary) hypertension: Secondary | ICD-10-CM | POA: Diagnosis not present

## 2017-09-11 DIAGNOSIS — Z79899 Other long term (current) drug therapy: Secondary | ICD-10-CM | POA: Insufficient documentation

## 2017-09-11 NOTE — Progress Notes (Signed)
Primary Care Physician: Burnis Medin, MD Referring Physician: Dr. Kari Baars is a 66 y.o. female with a h/o HTN, paroxysmal afib, initially diagnosed with afib for several years. She is on rate control meds as well as eliquis 5 mg for a chadsvasc score  of 3. She is in the afib clinic to discuss increasing afib burden and options to manage.  She does not smoke, She is not obese, she does exercise but unfortunately, has curtailed some of her exercise as she has found that this is a trigger. No alcohol use, minimal caffeine use. Denies any significant snoring/apnea history.  Today, she denies symptoms of palpitations, chest pain, shortness of breath, orthopnea, PND, lower extremity edema, dizziness, presyncope, syncope, or neurologic sequela. The patient is tolerating medications without difficulties and is otherwise without complaint today.   Past Medical History:  Diagnosis Date  . A-fib ([place])   . Allergic rhinitis   . Aortic insufficiency   . CAT SCRATCH 09/10/2009   Qualifier: Diagnosis of  By: Regis Bill MD, [name]   . CHEST WALL PAIN, ACUTE 07/21/2010   Qualifier: Diagnosis of  By: Sarajane Jews MD, Ishmael Holter   . Clavicle fracture 3/08   left  . Diverticulosis   . Heart palpitations   . HTN (hypertension)   . Osteopenia   . Vision disturbance 03/13/2013   Negative neuro MRA MRI   Past Surgical History:  Procedure Laterality Date  . ABDOMINAL HYSTERECTOMY     pt denies 04/15/14  . APPENDECTOMY    . LAPAROSCOPIC SALPINGO OOPHERECTOMY Right   . TONSILLECTOMY AND ADENOIDECTOMY      Current Outpatient Medications  Medication Sig Dispense Refill  . acetaminophen (TYLENOL) 500 MG tablet Take 500 mg by mouth as needed.    [name] alendronate (FOSAMAX) 70 MG tablet TAKE 1 TABLET ONCE A WEEK WITH A FULL GLASS OF WATER ON AN EMPTY STOMACH 12 tablet 1  . Calcium Carbonate-Vitamin D (CALCIUM PLUS VITAMIN D PO) Take 1 tablet by mouth daily.     [name] CARTIA XT 300 MG 24 hr capsule TAKE 1  CAPSULE DAILY 90 capsule 3  . Cholecalciferol (VITAMIN D3) 1000 units CAPS Take 2 capsules by mouth daily.     [name] ELIQUIS 5 MG TABS tablet TAKE 1 TABLET BY MOUTH TWICE DAILY 180 tablet 1  . LORazepam (ATIVAN) 0.5 MG tablet Take 1 tablet (0.5 mg total) by mouth 3 (three) times daily as needed for anxiety. 20 tablet 0  . metoprolol tartrate (LOPRESSOR) 25 MG tablet Take 1 tablet (25 mg total) by mouth 2 (two) times daily. 180 tablet 3  . montelukast (SINGULAIR) 10 MG tablet TAKE 1 TABLET BY MOUTH DAILY 90 tablet 1  . Multiple Vitamins-Minerals (PRESERVISION AREDS PO) Take 1 tablet by mouth 2 (two) times daily.     [name] ibuprofen (ADVIL,MOTRIN) 800 MG tablet Take 800 mg by mouth 2 (two) times daily as needed for moderate pain.      No current facility-administered medications for this encounter.     Allergies  Allergen Reactions  . Codeine Phosphate     drowsy  . Demerol [Meperidine] Nausea Only  . Meperidine Hcl Nausea Only  . Sulfa Antibiotics     Rash    Social History   Socioeconomic History  . Marital status: Married    Spouse name: Not on file  . Number of children: 0  . Years of education: Not on file  . Highest education level: Not  on file  Social Needs  . Financial resource strain: Not on file  . Food insecurity - worry: Not on file  . Food insecurity - inability: Not on file  . Transportation needs - medical: Not on file  . Transportation needs - non-medical: Not on file  Occupational History  . Occupation: Veterinary surgeon  . Occupation: REPORTER    Employer: [employer]  Tobacco Use  . Smoking status: Never Smoker  . Smokeless tobacco: Never Used  Substance and Sexual Activity  . Alcohol use: No    Comment: never  . Drug use: No  . Sexual activity: Not on file  Other Topics Concern  . Not on file  Social History Narrative   Married   hh of 2    p0g0   Works for [employer]     Family History  Problem Relation Age of Onset  . Scleroderma Mother     . Hyperlipidemia Mother   . Coronary artery disease Father 49  . Esophageal cancer Paternal Grandmother     ROS- All systems are reviewed and negative except as per the HPI above  Physical Exam: Vitals:   09/11/17 1054  BP: (!) 152/70  Pulse: (!) 58  Weight: 108 lb 9.6 oz (49.3 kg)  Height: 5\' 5"  (1.651 m)   Wt Readings from Last 3 Encounters:  09/11/17 108 lb 9.6 oz (49.3 kg)  08/24/17 109 lb (49.4 kg)  07/30/17 109 lb (49.4 kg)    Labs: Lab Results  Component Value Date   NA 142 09/14/2016   K 4.0 09/14/2016   CL 104 09/14/2016   CO2 30 09/14/2016   GLUCOSE 87 09/14/2016   BUN 16 09/14/2016   CREATININE 0.69 09/14/2016   CALCIUM 9.6 09/14/2016   No results found for: INR Lab Results  Component Value Date   CHOL 238 (H) 09/14/2016   HDL 87.50 09/14/2016   LDLCALC 137 (H) 09/14/2016   TRIG 68.0 09/14/2016     GEN- The patient is well appearing, alert and oriented x 3 today.   Head- normocephalic, atraumatic Eyes-  Sclera clear, conjunctiva pink Ears- hearing intact Oropharynx- clear Neck- supple, no JVP Lymph- no cervical lymphadenopathy Lungs- Clear to ausculation bilaterally, normal work of breathing Heart- Regular rate and rhythm, no murmurs, rubs or gallops, PMI not laterally displaced GI- soft, NT, ND, + BS Extremities- no clubbing, cyanosis, or edema MS- no significant deformity or atrophy Skin- no rash or lesion Psych- euthymic mood, full affect Neuro- strength and sensation are intact  EKG- Sinus brady at 58 bpm, pr int 168 ms, qrs int 88 ms, qtc 429 ms Echo-Study Conclusions  - Left ventricle: The cavity size was normal. Systolic function was   normal. The estimated ejection fraction was in the range of 55%   to 60%. Wall motion was normal; there were no regional wall   motion abnormalities. Features are consistent with a pseudonormal   left ventricular filling pattern, with concomitant abnormal   relaxation and increased filling pressure  (grade 2 diastolic   dysfunction). Doppler parameters are consistent with high   ventricular filling pressure. - Aortic valve: Transvalvular velocity was within the normal range.   There was no stenosis. There was moderate regurgitation. - Mitral valve: Transvalvular velocity was within the normal range.   There was no evidence for stenosis. There was no regurgitation. - Left atrium: The atrium was mildly dilated. - Right ventricle: The cavity size was normal. Wall thickness was  normal. Systolic function was normal. - Atrial septum: No defect or patent foramen ovale was identified. - Pulmonary arteries: Systolic pressure was within the normal   range. PA peak pressure: 23 mm Hg (S).   ETT-Blood pressure demonstrated a normal response to exercise.  ST segment depression was noted during stress in the II, III, V5 and V6 leads.   ETT with fair exercise tolerance (7:48); no chest pain; normal BP response; pt developed atrial fibrillation in recovery; noted to have 1-2 mm ST depression in the inferolateral leads when in atrial fibrillation; abnormal ETT; pt seen by Dr Claiborne Billings following ETT.   Assessment and Plan: 1. Paroxysmal afib Atrial fibrillation discussed in details Continue Cardizem and metoprolol for rate control Continue Eliquis 5 mg  for a chadsvasc score of 3 Antiarrythmic drugs discussed in detail I discussed with Dr. Percival Spanish and he feels stress test did not show any significant concerns for CAD, pt would like to proceed with flecainide. She will take 50 mg bid and then come in at 3 days for EKG She will need f/u ETT on flecainide  Anav Lammert C. Ersa Delaney, [facility] [address] [place], [place] [address] [phone]

## 2017-09-12 ENCOUNTER — Other Ambulatory Visit (HOSPITAL_COMMUNITY): Payer: Self-pay | Admitting: *Deleted

## 2017-09-12 MED ORDER — FLECAINIDE ACETATE 50 MG PO TABS: 50.0000 mg | ORAL_TABLET | Freq: Two times a day (BID) | ORAL | 3 refills | Status: DC

## 2017-09-19 ENCOUNTER — Ambulatory Visit (HOSPITAL_COMMUNITY)
Admission: RE | Admit: 2017-09-19 | Discharge: 2017-09-19 | Disposition: A | Discharge status: Home / Self Care | Payer: BLUE CROSS/BLUE SHIELD | Source: Ambulatory Visit | Attending: Nurse Practitioner | Admitting: Nurse Practitioner

## 2017-09-19 DIAGNOSIS — I480 Paroxysmal atrial fibrillation: Secondary | ICD-10-CM | POA: Diagnosis present

## 2017-09-19 DIAGNOSIS — Z79899 Other long term (current) drug therapy: Secondary | ICD-10-CM | POA: Diagnosis not present

## 2017-09-19 DIAGNOSIS — R9431 Abnormal electrocardiogram [ECG] [EKG]: Secondary | ICD-10-CM | POA: Diagnosis not present

## 2017-09-19 NOTE — Patient Instructions (Addendum)
Scheduler will be in touch to schedule exercise test.  Follow up with Dr. Percival Spanish in 4 months.

## 2017-09-19 NOTE — Progress Notes (Signed)
  Pt in for f/u EKG with start of flecainide 50 mg bid. Intervals unchanged and is tolerating well. SR on ekg with pr int 180 ms, qrs int 90 ms, qtc 436 ms No afib since start of flecainide. Will schedule ETT at Dr. Rosezella [place] office. Will see back in one month and with Dr. Percival Spanish 3-4 months after that.

## 2017-09-19 NOTE — Progress Notes (Signed)
Pt in for repeat EKG since starting flecainide.   EKG to be reviewed by Ceasar Lund

## 2017-09-25 ENCOUNTER — Telehealth (HOSPITAL_COMMUNITY): Payer: Self-pay | Admitting: *Deleted

## 2017-09-25 NOTE — Telephone Encounter (Signed)
Patient has continued to have breakthrough afib after starting daily flecainide. She had talked to Roderic Palau NP earlier in the week who recommended increasing dose to 75mg  BID but pt was hesitate now she is thinking she should. Instructed pt to increase dose of flecainide to 75mg  BID and will reck EKG on Friday. Pt verbalized understanding and in agreement.

## 2017-09-28 ENCOUNTER — Ambulatory Visit (HOSPITAL_COMMUNITY)
Admission: RE | Admit: 2017-09-28 | Discharge: 2017-09-28 | Disposition: A | Discharge status: Home / Self Care | Payer: BLUE CROSS/BLUE SHIELD | Source: Ambulatory Visit | Attending: Nurse Practitioner | Admitting: Nurse Practitioner

## 2017-09-28 DIAGNOSIS — I517 Cardiomegaly: Secondary | ICD-10-CM | POA: Insufficient documentation

## 2017-09-28 DIAGNOSIS — R001 Bradycardia, unspecified: Secondary | ICD-10-CM | POA: Insufficient documentation

## 2017-09-28 DIAGNOSIS — Z79899 Other long term (current) drug therapy: Secondary | ICD-10-CM | POA: Insufficient documentation

## 2017-09-28 DIAGNOSIS — R9431 Abnormal electrocardiogram [ECG] [EKG]: Secondary | ICD-10-CM | POA: Insufficient documentation

## 2017-09-28 MED ORDER — FLECAINIDE ACETATE 50 MG PO TABS: 75.0000 mg | ORAL_TABLET | Freq: Two times a day (BID) | ORAL | 3 refills | Status: DC

## 2017-09-28 NOTE — Progress Notes (Addendum)
Pt in for repeat EKG.  To be reviewed by Ceasar Lund  Ekg on 75 mg  flecainide shows sinus brady at 57 bpm, pr int 194 ms, qrs int 96 ms, qtc 449 ms, IRBBB, qtc 449 ms. She has an ETT on flecainde scheduled next week at [facility].

## 2017-10-02 ENCOUNTER — Telehealth (HOSPITAL_COMMUNITY): Payer: Self-pay

## 2017-10-02 NOTE — Telephone Encounter (Signed)
Encounter complete. 

## 2017-10-04 ENCOUNTER — Inpatient Hospital Stay (HOSPITAL_COMMUNITY)
Admission: RE | Admit: 2017-10-04 | Payer: BLUE CROSS/BLUE SHIELD | Source: Ambulatory Visit | Attending: Nurse Practitioner | Admitting: Nurse Practitioner

## 2017-10-08 ENCOUNTER — Telehealth: Payer: Self-pay | Admitting: Internal Medicine

## 2017-10-08 NOTE — Telephone Encounter (Signed)
Copied from [place] [phone]. Topic: Inquiry >> Oct 08, 2017  2:28 PM Oliver Pila B wrote: Reason for CRM: pt called to have any active medications transferred to [place] fax [phone], contact pt for any concern

## 2017-10-09 ENCOUNTER — Other Ambulatory Visit (HOSPITAL_COMMUNITY): Payer: Self-pay | Admitting: *Deleted

## 2017-10-09 MED ORDER — FLECAINIDE ACETATE 50 MG PO TABS
75.0000 mg | ORAL_TABLET | Freq: Two times a day (BID) | ORAL | 2 refills | Status: DC
Start: 2017-10-09 — End: 2018-04-21

## 2017-10-10 ENCOUNTER — Other Ambulatory Visit: Payer: Self-pay | Admitting: Pharmacist Clinician (PhC)/ Clinical Pharmacy Specialist

## 2017-10-10 ENCOUNTER — Telehealth: Payer: Self-pay | Admitting: *Deleted

## 2017-10-10 MED ORDER — DILTIAZEM HCL ER COATED BEADS 300 MG PO CP24: 300.0000 mg | ORAL_CAPSULE | Freq: Every day | ORAL | 1 refills | Status: DC

## 2017-10-10 MED ORDER — METOPROLOL TARTRATE 25 MG PO TABS: 25.0000 mg | ORAL_TABLET | Freq: Two times a day (BID) | ORAL | 1 refills | Status: DC

## 2017-10-10 MED ORDER — APIXABAN 5 MG PO TABS: 5.0000 mg | ORAL_TABLET | Freq: Two times a day (BID) | ORAL | 1 refills | Status: DC

## 2017-10-10 NOTE — Telephone Encounter (Signed)
Pt requesting that her Lorazepam Rx be refilled through [facility] This is a PRN medication that she uses when flying. Are you okay with filling this through [facility] or continue filling PRN through local? Please advise Dr Regis Bill, thanks.

## 2017-10-10 NOTE — Telephone Encounter (Signed)
REFILL 

## 2017-10-11 ENCOUNTER — Encounter (HOSPITAL_COMMUNITY): Payer: BLUE CROSS/BLUE SHIELD

## 2017-10-12 ENCOUNTER — Telehealth (HOSPITAL_COMMUNITY): Payer: Self-pay

## 2017-10-12 NOTE — Telephone Encounter (Signed)
Encounter complete. 

## 2017-10-16 ENCOUNTER — Telehealth (HOSPITAL_COMMUNITY): Payer: Self-pay | Admitting: *Deleted

## 2017-10-16 NOTE — Telephone Encounter (Signed)
Close encounter 

## 2017-10-17 ENCOUNTER — Ambulatory Visit (HOSPITAL_COMMUNITY)
Admission: RE | Admit: 2017-10-17 | Discharge: 2017-10-17 | Disposition: A | Discharge status: Home / Self Care | Payer: Medicare Other | Source: Ambulatory Visit | Attending: Cardiovascular Disease | Admitting: Cardiovascular Disease

## 2017-10-17 ENCOUNTER — Ambulatory Visit (HOSPITAL_COMMUNITY): Payer: Self-pay | Admitting: Nurse Practitioner

## 2017-10-17 DIAGNOSIS — I480 Paroxysmal atrial fibrillation: Secondary | ICD-10-CM

## 2017-10-17 LAB — EXERCISE TOLERANCE TEST
CSEPED: 6 min
CSEPHR: 64 %
CSEPPHR: 100 {beats}/min
Estimated workload: 7 METS
Exercise duration (sec): 0 s
MPHR: 156 {beats}/min
RPE: 17
Rest HR: 63 {beats}/min

## 2017-10-18 NOTE — Telephone Encounter (Signed)
Would refill disp 20   And can send wherever  Works Best with her plan   but tell patient that usually send local because this is a prn ,med and I dont rx 90 days of these meds

## 2017-10-19 ENCOUNTER — Encounter: Payer: Self-pay | Admitting: Internal Medicine

## 2017-10-19 ENCOUNTER — Ambulatory Visit (INDEPENDENT_AMBULATORY_CARE_PROVIDER_SITE_OTHER): Payer: Medicare Other | Admitting: Internal Medicine

## 2017-10-19 VITALS — BP 108/60 | HR 61 | Ht 65.0 in | Wt 109.4 lb

## 2017-10-19 DIAGNOSIS — E559 Vitamin D deficiency, unspecified: Secondary | ICD-10-CM

## 2017-10-19 DIAGNOSIS — M810 Age-related osteoporosis without current pathological fracture: Secondary | ICD-10-CM

## 2017-10-19 LAB — VITAMIN D 25 HYDROXY (VIT D DEFICIENCY, FRACTURES): VITD: 40.16 ng/mL (ref 30.00–100.00)

## 2017-10-19 MED ORDER — ALENDRONATE SODIUM 70 MG PO TABS: ORAL_TABLET | ORAL | 3 refills | Status: DC

## 2017-10-19 NOTE — Patient Instructions (Signed)
Please continue: - Fosamax 70 mg weekly - vitamin D 2000 + 400 units daily.  Please stop at the lab.  Send me a message to order the bone density scan in 07/2018 and let's schedule a new appt in 08/2018.

## 2017-10-19 NOTE — Telephone Encounter (Signed)
Attempted to reach pt left a message for pt to return my call in the office in regards to where her Rx for Ativan should be sent.

## 2017-10-19 NOTE — Progress Notes (Signed)
Patient ID: [name] [name], female   DOB: Mar 03, 1951, 66 y.o.   MRN: [id number]    HPI  [name] [name] is a 66 y.o.-year-old female, returning for follow-up for osteoporosis.  Last visit 1 year ago. Since last visit, she changed to [place].  Pt was dx with OP in 2017.  I reviewed pt's DEXA scans: Date L1-L4 T score FN T score FRAX   07/27/2016  -1.8 (-3.4%*) RFN: -2.6 (-10.2%*) LFN: -2.4   09/30/2010  -1.6 RFN: -2.0 LFN: -1.8 10 year MOF: 11.9%  10 year hip fracture: 2.2%    She had the following fractures:  1. collarbone - ~2010 - fell in a parking lot 2. L foot fx and sprained ankle - few years ago - tripped and fell 3. R foot fx and sprained ankle - 10/2015 No falls or fractures since last visit.  No dizziness/vertigo/orthostasis/poor vision  Previous OP treatments:  - started Fosamax 70 mg weekly - since 12/2016 - Fosamax for 5 years  - up to 2011  She does have a h/o vitamin D deficiency.  At last visit, vitamin D was normal, but in the low normal range: Lab Results  Component Value Date   VD25OH 31.41 12/20/2016   VD25OH 28.88 (L) 09/14/2016   Pt is on calcium (Ca-D 600 mg-400 IU) and vitamin D (started 08/2016: 1000 units daily >> increased to 2000 units now +  Ca-D). She also eats dairy and green, leafy, vegetables.   She continues to walk in the neighborhood during the summer, however, during the winter, she is doing aerobic classes (strength) 1-2 times a week.    She does not take high vitamin A doses.  Menopause was in late 39s. She was on HRT.  Pt does have a FH of osteoporosis in mother - kyphosis. No fractures.  No h/o hyper/hypocalcemia or hyperparathyroidism. No h/o kidney stones. Lab Results  Component Value Date   PTH 59 09/14/2016   CALCIUM 9.6 09/14/2016   CALCIUM 9.5 10/15/2014   CALCIUM 9.5 04/14/2014   CALCIUM 9.4 04/13/2014   CALCIUM 9.4 03/13/2013   CALCIUM 9.4 12/08/2011   CALCIUM 9.4 09/03/2009   CALCIUM 9.8 03/05/2007   No h/o  thyrotoxicosis. Reviewed TSH recent levels - normal: Lab Results  Component Value Date   TSH 1.22 09/14/2016   TSH 1.065 08/25/2015   TSH 1.04 10/15/2014   TSH 0.53 12/08/2011   TSH 1.15 09/03/2009   No h/o CKD. Last BUN/Cr: Lab Results  Component Value Date   BUN 16 09/14/2016   CREATININE 0.69 09/14/2016   For Afib >> she started Flecainide and Metoprolol and continues Eloquis. She is going to the Afib clinic. She had a recent stress test this past week. She was advised to cut back on exercise before the test >> just some walking.  ROS: Constitutional: no weight gain/no weight loss, no fatigue, no subjective hyperthermia, no subjective hypothermia, + nocturia x2 Eyes: no blurry vision, no xerophthalmia ENT: no sore throat, no nodules palpated in throat, no dysphagia, no odynophagia, no hoarseness Cardiovascular: no CP/no SOB/+ palpitations/no leg swelling Respiratory: no cough/no SOB/no wheezing Gastrointestinal: no N/no V/no D/no C/no acid reflux Musculoskeletal: no muscle aches/no joint aches Skin: no rashes, no hair loss Neurological: no tremors/no numbness/no tingling/no dizziness  I reviewed pt's medications, allergies, PMH, social hx, family hx, and changes were documented in the history of present illness. Otherwise, unchanged from my initial visit note.  Past Medical History:  Diagnosis Date  . A-fib (  HCC)   . Allergic rhinitis   . Aortic insufficiency   . CAT SCRATCH 09/10/2009   Qualifier: Diagnosis of  By: Regis Bill MD, [name]   . CHEST WALL PAIN, ACUTE 07/21/2010   Qualifier: Diagnosis of  By: Sarajane Jews MD, Ishmael Holter   . Clavicle fracture 3/08   left  . Diverticulosis   . Heart palpitations   . HTN (hypertension)   . Osteopenia   . Vision disturbance 03/13/2013   Negative neuro MRA MRI   Past Surgical History:  Procedure Laterality Date  . ABDOMINAL HYSTERECTOMY     pt denies 04/15/14  . APPENDECTOMY    . LAPAROSCOPIC SALPINGO OOPHERECTOMY Right   .  TONSILLECTOMY AND ADENOIDECTOMY     Social History   Social History  . Marital status: Married    Spouse name: N/A  . Number of children: 0 - had infertility tx's in her 13s   Occupational History  . 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 Smoking status: Never Smoker  . Smokeless tobacco: Never Used  . Alcohol use No     Comment: never  . Drug use: No   Social History Narrative   Married   hh of 2    p0g0   Works for [employer] and record    Current Outpatient Medications on File Prior to Visit  Medication Sig Dispense Refill  . acetaminophen (TYLENOL) 500 MG tablet Take 500 mg by mouth as needed.    [name] alendronate (FOSAMAX) 70 MG tablet TAKE 1 TABLET ONCE A WEEK WITH A FULL GLASS OF WATER ON AN EMPTY STOMACH 12 tablet 1  . apixaban (ELIQUIS) 5 MG TABS tablet Take 1 tablet (5 mg total) by mouth 2 (two) times daily. 180 tablet 1  . Calcium Carbonate-Vitamin D (CALCIUM PLUS VITAMIN D PO) Take 1 tablet by mouth daily.     . Cholecalciferol (VITAMIN D3) 1000 units CAPS Take 2 capsules by mouth daily.     [name] diltiazem (CARTIA XT) 300 MG 24 hr capsule Take 1 capsule (300 mg total) by mouth daily. 90 capsule 1  . flecainide (TAMBOCOR) 50 MG tablet Take 1.5 tablets (75 mg total) by mouth 2 (two) times daily. 270 tablet 2  . ibuprofen (ADVIL,MOTRIN) 800 MG tablet Take 800 mg by mouth 2 (two) times daily as needed for moderate pain.     [name] LORazepam (ATIVAN) 0.5 MG tablet Take 1 tablet (0.5 mg total) by mouth 3 (three) times daily as needed for anxiety. 20 tablet 0  . metoprolol tartrate (LOPRESSOR) 25 MG tablet Take 1 tablet (25 mg total) by mouth 2 (two) times daily. 180 tablet 1  . montelukast (SINGULAIR) 10 MG tablet TAKE 1 TABLET BY MOUTH DAILY 90 tablet 1  . Multiple Vitamins-Minerals (PRESERVISION AREDS PO) Take 1 tablet by mouth 2 (two) times daily.      No current facility-administered medications on file prior to visit.    Allergies  Allergen  Reactions  . Codeine Phosphate     drowsy  . Demerol [Meperidine] Nausea Only  . Meperidine Hcl Nausea Only  . Sulfa Antibiotics     Rash   Family History  Problem Relation Age of Onset  . Scleroderma Mother   . Hyperlipidemia Mother   . Coronary artery disease Father 8  . Esophageal cancer Paternal Grandmother    PE: BP 108/60   Pulse 61   Ht 5\' 5"  (1.651 m)  Wt 109 lb 6.4 oz (49.6 kg)   SpO2 96%   BMI 18.21 kg/m  Wt Readings from Last 3 Encounters:  10/19/17 109 lb 6.4 oz (49.6 kg)  09/11/17 108 lb 9.6 oz (49.3 kg)  08/24/17 109 lb (49.4 kg)   Constitutional: thin, in NAD, no kyphosis Eyes: PERRLA, EOMI, no exophthalmos ENT: moist mucous membranes, no thyromegaly, no cervical lymphadenopathy Cardiovascular: RRR, No MRG Respiratory: CTA B Gastrointestinal: abdomen soft, NT, ND, BS+ Musculoskeletal: no deformities, strength intact in all 4 Skin: moist, warm, no rashes Neurological: no tremor with outstretched hands, DTR normal in all 4  Assessment: 1. Osteoporosis  2. Vit D insufficiency  Plan: 1. Osteoporosis -Likely postmenopausal -She has family history of osteoporosis -At last visit, we discussed about the fact that she has a high risk for fracture since her T-scores are lower than -2.5 -We decided to normalize her vitamin D level -we continued  calcium + vitamin D and we added an extra 1000 units vitamin D daily >> next level was low normal.  At that time, I suggested to increase the vitamin D 2000 units daily.  We will check a new level today.  -We also discussed about treatment with Prolia, which she agreed with, however, this was too expensive for her so we had to start Fosamax 70 mg weekly.  She has no side effects.  No bone pain, no acid reflux.  No dental work in process.  -since last visit, she changed insurances >> after next DXA scan, we will try again Prolia >> she may need to call her insurance to see if starting this would push her into the  doughnut hole sooner -She is due for a new DXA scan in 06/2018 >> advised to call me to order this before our next visit so we can reviewe the results together then -will check a GFR today -We will see her back in a year  2. Vit D insufficiency - on 2000 units (increased 11/2016) + Ca-vitD daily (400 units more/day) - will recheck level today  Office Visit on 10/19/2017  Component Date Value Ref Range Status  . VITD 10/19/2017 40.16  30.00 - 100.00 ng/mL Final  . Glucose, Bld 10/19/2017 83  65 - 99 mg/dL Final   Comment: .            Fasting reference interval .   . BUN 10/19/2017 18  7 - 25 mg/dL Final  . Creat 10/19/2017 0.75  0.50 - 0.99 mg/dL Final   Comment: For patients >19 years of age, the reference limit for Creatinine is approximately 13% higher for people identified as African-American. .   . GFR, Est Non African American 10/19/2017 84  > OR = 60 mL/min/1.70m2 Final  . GFR, Est African American 10/19/2017 97  > OR = 60 mL/min/1.30m2 Final  . BUN/Creatinine Ratio 71/69/6789 NOT APPLICABLE  6 - 22 (calc) Final  . Sodium 10/19/2017 140  135 - 146 mmol/L Final  . Potassium 10/19/2017 4.2  3.5 - 5.3 mmol/L Final  . Chloride 10/19/2017 103  98 - 110 mmol/L Final  . CO2 10/19/2017 31  20 - 32 mmol/L Final  . Calcium 10/19/2017 9.4  8.6 - 10.4 mg/dL Final   Normal labs.  [name] Kingdom, MD PhD [facility] Endocrinology

## 2017-10-20 LAB — BASIC METABOLIC PANEL WITH GFR
BUN: 18 mg/dL (ref 7–25)
CO2: 31 mmol/L (ref 20–32)
CREATININE: 0.75 mg/dL (ref 0.50–0.99)
Calcium: 9.4 mg/dL (ref 8.6–10.4)
Chloride: 103 mmol/L (ref 98–110)
GFR, EST NON AFRICAN AMERICAN: 84 mL/min/{1.73_m2} (ref >=60)
GFR, Est African American: 97 mL/min/{1.73_m2} (ref >=60)
Glucose, Bld: 83 mg/dL (ref 65–99)
Potassium: 4.2 mmol/L (ref 3.5–5.3)
SODIUM: 140 mmol/L (ref 135–146)

## 2017-10-25 NOTE — Telephone Encounter (Signed)
Pt aware of rec's per Dr Regis Bill, states that she wants to call and check with [facility] and [facility] on which pharmacy would be cheaper for the #20 supply. Pt will call back at a later date and let us know which pharmacy she wants this sent to. Nothing further needed.

## 2017-11-01 ENCOUNTER — Ambulatory Visit (HOSPITAL_COMMUNITY)
Admission: RE | Admit: 2017-11-01 | Discharge: 2017-11-01 | Disposition: A | Discharge status: Home / Self Care | Payer: Medicare Other | Source: Ambulatory Visit | Attending: Nurse Practitioner | Admitting: Nurse Practitioner

## 2017-11-01 ENCOUNTER — Encounter (HOSPITAL_COMMUNITY): Payer: Self-pay | Admitting: Nurse Practitioner

## 2017-11-01 ENCOUNTER — Other Ambulatory Visit: Payer: Self-pay | Admitting: Pharmacist Clinician (PhC)/ Clinical Pharmacy Specialist

## 2017-11-01 VITALS — BP 138/64 | HR 61 | Ht 65.0 in | Wt 110.0 lb

## 2017-11-01 DIAGNOSIS — I10 Essential (primary) hypertension: Secondary | ICD-10-CM | POA: Diagnosis not present

## 2017-11-01 DIAGNOSIS — Z79899 Other long term (current) drug therapy: Secondary | ICD-10-CM | POA: Diagnosis not present

## 2017-11-01 DIAGNOSIS — Z90721 Acquired absence of ovaries, unilateral: Secondary | ICD-10-CM | POA: Insufficient documentation

## 2017-11-01 DIAGNOSIS — Z7983 Long term (current) use of bisphosphonates: Secondary | ICD-10-CM | POA: Insufficient documentation

## 2017-11-01 DIAGNOSIS — Z90710 Acquired absence of both cervix and uterus: Secondary | ICD-10-CM | POA: Insufficient documentation

## 2017-11-01 DIAGNOSIS — I480 Paroxysmal atrial fibrillation: Secondary | ICD-10-CM

## 2017-11-01 DIAGNOSIS — Z8269 Family history of other diseases of the musculoskeletal system and connective tissue: Secondary | ICD-10-CM | POA: Insufficient documentation

## 2017-11-01 DIAGNOSIS — Z7901 Long term (current) use of anticoagulants: Secondary | ICD-10-CM | POA: Diagnosis not present

## 2017-11-01 DIAGNOSIS — Z8249 Family history of ischemic heart disease and other diseases of the circulatory system: Secondary | ICD-10-CM | POA: Insufficient documentation

## 2017-11-01 DIAGNOSIS — Z885 Allergy status to narcotic agent status: Secondary | ICD-10-CM | POA: Diagnosis not present

## 2017-11-01 DIAGNOSIS — Z882 Allergy status to sulfonamides status: Secondary | ICD-10-CM | POA: Insufficient documentation

## 2017-11-01 DIAGNOSIS — M8580 Other specified disorders of bone density and structure, unspecified site: Secondary | ICD-10-CM | POA: Insufficient documentation

## 2017-11-01 MED ORDER — APIXABAN 5 MG PO TABS: 5.0000 mg | ORAL_TABLET | Freq: Two times a day (BID) | ORAL | 1 refills | Status: DC

## 2017-11-01 NOTE — Progress Notes (Signed)
Primary Care Physician: Burnis Medin, MD Referring Physician: Dr. Kari Baars is a 67 y.o. female with a h/o HTN, paroxysmal afib, initially diagnosed with afib for several years. She is on rate control meds as well as eliquis 5 mg for a chadsvasc score  of 3. She was seen the afib clinic to discuss increasing afib burden and options to manage.  She reluctantly agreed to try flecainide, initially  dosed at 50 mg bid and increased to 75 mg bid when she reported that she was still having afib episodes. She then had repeat ETT on flecainide and did not show any interval changes but some ST changes similiar to first ETT. No chest pain while on treadmill. In the clinic today, 11/01/17, she reports that she is still having around 2 episodes a week, 1-2 hours at a time. Her burden is about the same but the duration of episodes is shorter. She is very anxious to increase dose of flecainide, prefers not to increase to 100 mg bid and does not really like to be on the drug.   Today, she denies symptoms of palpitations, chest pain, shortness of breath, orthopnea, PND, lower extremity edema, dizziness, presyncope, syncope, or neurologic sequela. The patient is tolerating medications without difficulties and is otherwise without complaint today.   Past Medical History:  Diagnosis Date  . A-fib ([place])   . Allergic rhinitis   . Aortic insufficiency   . CAT SCRATCH 09/10/2009   Qualifier: Diagnosis of  By: Regis Bill MD, [name]   . CHEST WALL PAIN, ACUTE 07/21/2010   Qualifier: Diagnosis of  By: Sarajane Jews MD, Ishmael Holter   . Clavicle fracture 3/08   left  . Diverticulosis   . Heart palpitations   . HTN (hypertension)   . Osteopenia   . Vision disturbance 03/13/2013   Negative neuro MRA MRI   Past Surgical History:  Procedure Laterality Date  . ABDOMINAL HYSTERECTOMY     pt denies 04/15/14  . APPENDECTOMY    . LAPAROSCOPIC SALPINGO OOPHERECTOMY Right   . TONSILLECTOMY AND ADENOIDECTOMY       Current Outpatient Medications  Medication Sig Dispense Refill  . acetaminophen (TYLENOL) 500 MG tablet Take 500 mg by mouth as needed.    [name] alendronate (FOSAMAX) 70 MG tablet TAKE 1 TABLET ONCE A WEEK WITH A FULL GLASS OF WATER ON AN EMPTY STOMACH 12 tablet 3  . apixaban (ELIQUIS) 5 MG TABS tablet Take 1 tablet (5 mg total) by mouth 2 (two) times daily. 180 tablet 1  . Calcium Carbonate-Vitamin D (CALCIUM PLUS VITAMIN D PO) Take 1 tablet by mouth daily.     . Cholecalciferol (VITAMIN D3) 1000 units CAPS Take 2 capsules by mouth daily.     [name] diltiazem (CARTIA XT) 300 MG 24 hr capsule Take 1 capsule (300 mg total) by mouth daily. 90 capsule 1  . flecainide (TAMBOCOR) 50 MG tablet Take 1.5 tablets (75 mg total) by mouth 2 (two) times daily. 270 tablet 2  . ibuprofen (ADVIL,MOTRIN) 800 MG tablet Take 800 mg by mouth 2 (two) times daily as needed for moderate pain.     [name] LORazepam (ATIVAN) 0.5 MG tablet Take 1 tablet (0.5 mg total) by mouth 3 (three) times daily as needed for anxiety. 20 tablet 0  . metoprolol tartrate (LOPRESSOR) 25 MG tablet Take 1 tablet (25 mg total) by mouth 2 (two) times daily. 180 tablet 1  . montelukast (SINGULAIR) 10 MG tablet TAKE 1  TABLET BY MOUTH DAILY 90 tablet 1  . Multiple Vitamins-Minerals (PRESERVISION AREDS PO) Take 1 tablet by mouth 2 (two) times daily.      No current facility-administered medications for this encounter.     Allergies  Allergen Reactions  . Codeine Phosphate     drowsy  . Demerol [Meperidine] Nausea Only  . Meperidine Hcl Nausea Only  . Sulfa Antibiotics     Rash    Social History   Socioeconomic History  . Marital status: Married    Spouse name: Not on file  . Number of children: 0  . Years of education: Not on file  . Highest education level: Not on file  Social Needs  . Financial resource strain: Not on file  . Food insecurity - worry: Not on file  . Food insecurity - inability: Not on file  . Transportation needs -  medical: Not on file  . Transportation needs - non-medical: Not on file  Occupational History  . Occupation: Veterinary surgeon  . Occupation: REPORTER    Employer: [employer]  Tobacco Use  . Smoking status: Never Smoker  . Smokeless tobacco: Never Used  Substance and Sexual Activity  . Alcohol use: No    Comment: never  . Drug use: No  . Sexual activity: Not on file  Other Topics Concern  . Not on file  Social History Narrative   Married   hh of 2    p0g0   Works for [employer]     Family History  Problem Relation Age of Onset  . Scleroderma Mother   . Hyperlipidemia Mother   . Coronary artery disease Father 57  . Esophageal cancer Paternal Grandmother     ROS- All systems are reviewed and negative except as per the HPI above  Physical Exam: Vitals:   11/01/17 1327  BP: 138/64  Pulse: 61  Weight: 110 lb (49.9 kg)  Height: 5\' 5"  (1.651 m)   Wt Readings from Last 3 Encounters:  11/01/17 110 lb (49.9 kg)  10/19/17 109 lb 6.4 oz (49.6 kg)  09/11/17 108 lb 9.6 oz (49.3 kg)    Labs: Lab Results  Component Value Date   NA 140 10/19/2017   K 4.2 10/19/2017   CL 103 10/19/2017   CO2 31 10/19/2017   GLUCOSE 83 10/19/2017   BUN 18 10/19/2017   CREATININE 0.75 10/19/2017   CALCIUM 9.4 10/19/2017   No results found for: INR Lab Results  Component Value Date   CHOL 238 (H) 09/14/2016   HDL 87.50 09/14/2016   LDLCALC 137 (H) 09/14/2016   TRIG 68.0 09/14/2016     GEN- The patient is well appearing, alert and oriented x 3 today.   Head- normocephalic, atraumatic Eyes-  Sclera clear, conjunctiva pink Ears- hearing intact Oropharynx- clear Neck- supple, no JVP Lymph- no cervical lymphadenopathy Lungs- Clear to ausculation bilaterally, normal work of breathing Heart- Regular rate and rhythm, no murmurs, rubs or gallops, PMI not laterally displaced GI- soft, NT, ND, + BS Extremities- no clubbing, cyanosis, or edema MS- no significant deformity or  atrophy Skin- no rash or lesion Psych- euthymic mood, full affect Neuro- strength and sensation are intact  EKG- Sinus brady at 61 bpm, pr int 166 ms, qrs int 94 ms, qtc 436 ms Echo-Study Conclusions  - Left ventricle: The cavity size was normal. Systolic function was   normal. The estimated ejection fraction was in the range of 55%   to 60%. Wall motion  was normal; there were no regional wall   motion abnormalities. Features are consistent with a pseudonormal   left ventricular filling pattern, with concomitant abnormal   relaxation and increased filling pressure (grade 2 diastolic   dysfunction). Doppler parameters are consistent with high   ventricular filling pressure. - Aortic valve: Transvalvular velocity was within the normal range.   There was no stenosis. There was moderate regurgitation. - Mitral valve: Transvalvular velocity was within the normal range.   There was no evidence for stenosis. There was no regurgitation. - Left atrium: The atrium was mildly dilated. - Right ventricle: The cavity size was normal. Wall thickness was   normal. Systolic function was normal. - Atrial septum: No defect or patent foramen ovale was identified. - Pulmonary arteries: Systolic pressure was within the normal   range. PA peak pressure: 23 mm Hg (S).   ETT-Blood pressure demonstrated a normal response to exercise.  ST segment depression was noted during stress in the II, III, V5 and V6 leads.   ETT with fair exercise tolerance (7:48); no chest pain; normal BP response; pt developed atrial fibrillation in recovery; noted to have 1-2 mm ST depression in the inferolateral leads when in atrial fibrillation; abnormal ETT; pt seen by Dr Claiborne Billings following ETT.   ETT on flecainide12/19/18  -Blood pressure demonstrated a normal response to exercise.  Horizontal ST segment depression ST segment depression of 1 mm was noted during stress in the V4, V5 and V6 leads.   Patient only achieved 64%  PMHR Baseline ECG voltage for LVH 1 mm horizontal ST depression in lateral leads with stress Suggest further ischemic testing    Assessment and Plan: 1. Paroxysmal afib Atrial fibrillation discussed in detail Continue Cardizem and metoprolol for rate control Continue Eliquis 5 mg  for a chadsvasc score of 3 Still having afib on flecainde but  very anxious re the drug and does not want to go up to 100 mg bid, but will stay on 75 mg bid for now Both ETT's showed horizontal ST depression , will forward to Dr. Percival Spanish to make sure he does not want to repeat with imaging She is not having any anginal symptoms and no chest pain on the treadmill, h/o CAD in her father.  I discussed with her change in antiarrythmic, probably tikosyn or ablation She would like to talk to Dr. Rayann Heman re above  Requested appointment with Dr. Rayann Heman to further discuss options to reduce afib burden  Geroge Baseman. [place], [facility] [address] [place],  [address] [phone]

## 2017-11-02 NOTE — Progress Notes (Signed)
Thanks.  I looked at the POET (Plain Old Exercise Treadmill) and I don't see any ischemic changes.

## 2017-11-21 ENCOUNTER — Encounter: Payer: Self-pay | Admitting: Internal Medicine

## 2017-11-21 ENCOUNTER — Ambulatory Visit: Payer: Medicare Other | Admitting: Internal Medicine

## 2017-11-21 VITALS — BP 136/62 | HR 55 | Ht 65.0 in | Wt 109.0 lb

## 2017-11-21 DIAGNOSIS — I480 Paroxysmal atrial fibrillation: Secondary | ICD-10-CM

## 2017-11-21 DIAGNOSIS — I351 Nonrheumatic aortic (valve) insufficiency: Secondary | ICD-10-CM | POA: Diagnosis not present

## 2017-11-21 NOTE — Patient Instructions (Signed)
Medication Instructions:  Your physician recommends that you continue on your current medications as directed. Please refer to the Current Medication list given to you today.  Labwork: None ordered.  Testing/Procedures: Your physician has recommended that you have an ablation. Catheter ablation is a medical procedure used to treat some cardiac arrhythmias (irregular heartbeats). During catheter ablation, a long, thin, flexible tube is put into a blood vessel in your groin (upper thigh), or neck. This tube is called an ablation catheter. It is then guided to your heart through the blood vessel. Radio frequency waves destroy small areas of heart tissue where abnormal heartbeats may cause an arrhythmia to start. Please see the instruction sheet given to you today.  If you decide to schedule an ablation, please call the office at [phone]  Follow-Up: Your physician recommends that you schedule a follow-up appointment as needed.    Any Other Special Instructions Will Be Listed Below (If Applicable).     If you need a refill on your cardiac medications before your next appointment, please call your pharmacy.

## 2017-11-21 NOTE — Progress Notes (Signed)
Electrophysiology Office Note   Date:  11/21/2017   ID:  [name] [name], DOB 1950-11-03, MRN [id number]  PCP:  Burnis Medin, MD  Cardiologist:  Dr Percival Spanish Primary Electrophysiologist: Thompson Grayer, MD    Chief Complaint  Patient presents with  . Atrial Fibrillation     History of Present Illness: [name] [name] is a 67 y.o. female who presents today for electrophysiology evaluation.   The patient is referred by Dr Percival Spanish and Roderic Palau NP for EP consultation regarding afib.  She has been diagnosed with atrial fibrillation by event monitor 07/2015.  She has been taking flecainide since November but continues to have afib episodes every few days.  She reports tachypalppitations and "nervousness" with her afib.  She feels washed out afterwards.  Today, she denies symptoms o chest pain, shortness of breath, orthopnea, PND, lower extremity edema, claudication, dizziness, presyncope, syncope, bleeding, or neurologic sequela. The patient is tolerating medications without difficulties and is otherwise without complaint today.    Past Medical History:  Diagnosis Date  . Allergic rhinitis   . Aortic insufficiency   . CAT SCRATCH 09/10/2009   Qualifier: Diagnosis of  By: Regis Bill MD, [name]   . Clavicle fracture 3/08   left  . Diverticulosis   . HTN (hypertension)   . Osteopenia   . Paroxysmal atrial fibrillation (HCC)   . Vision disturbance 03/13/2013   Negative neuro MRA MRI   Past Surgical History:  Procedure Laterality Date  . ABDOMINAL HYSTERECTOMY     pt denies 04/15/14  . APPENDECTOMY    . LAPAROSCOPIC SALPINGO OOPHERECTOMY Right   . TONSILLECTOMY AND ADENOIDECTOMY       Current Outpatient Medications  Medication Sig Dispense Refill  . acetaminophen (TYLENOL) 500 MG tablet Take 500 mg by mouth as needed.    [name] alendronate (FOSAMAX) 70 MG tablet TAKE 1 TABLET ONCE A WEEK WITH A FULL GLASS OF WATER ON AN EMPTY STOMACH 12 tablet 3  . apixaban (ELIQUIS) 5 MG TABS tablet  Take 1 tablet (5 mg total) by mouth 2 (two) times daily. 180 tablet 1  . Calcium Carbonate-Vitamin D (CALCIUM PLUS VITAMIN D PO) Take 1 tablet by mouth daily.     . Cholecalciferol (VITAMIN D3) 1000 units CAPS Take 2 capsules by mouth daily.     [name] diltiazem (CARTIA XT) 300 MG 24 hr capsule Take 1 capsule (300 mg total) by mouth daily. 90 capsule 1  . flecainide (TAMBOCOR) 50 MG tablet Take 1.5 tablets (75 mg total) by mouth 2 (two) times daily. 270 tablet 2  . ibuprofen (ADVIL,MOTRIN) 800 MG tablet Take 800 mg by mouth 2 (two) times daily as needed for moderate pain.     [name] LORazepam (ATIVAN) 0.5 MG tablet Take 1 tablet (0.5 mg total) by mouth 3 (three) times daily as needed for anxiety. 20 tablet 0  . metoprolol tartrate (LOPRESSOR) 25 MG tablet Take 1 tablet (25 mg total) by mouth 2 (two) times daily. 180 tablet 1  . montelukast (SINGULAIR) 10 MG tablet TAKE 1 TABLET BY MOUTH DAILY 90 tablet 1  . Multiple Vitamins-Minerals (PRESERVISION AREDS PO) Take 1 tablet by mouth 2 (two) times daily.      No current facility-administered medications for this visit.     Allergies:   Codeine phosphate; Demerol [meperidine]; Meperidine hcl; and Sulfa antibiotics   Social History:  The patient  reports that  has never smoked. she has never used smokeless tobacco. She reports that she  does not drink alcohol or use drugs.   Family History:  The patient's  family history includes Coronary artery disease (age of onset: 61) in her father; Esophageal cancer in her paternal grandmother; Hyperlipidemia in her mother; Scleroderma in her mother.    ROS:  Please see the history of present illness.   All other systems are personally reviewed and negative.    PHYSICAL EXAM: VS:  BP 136/62   Pulse (!) 55   Ht 5\' 5"  (1.651 m)   Wt 109 lb (49.4 kg)   BMI 18.14 kg/m  , BMI Body mass index is 18.14 kg/m. GEN: Well nourished, well developed, in no acute distress  HEENT: normal  Neck: no JVD, carotid bruits, or  masses Cardiac: RRR; no murmurs, rubs, or gallops,no edema  Respiratory:  clear to auscultation bilaterally, normal work of breathing GI: soft, nontender, nondistended, + BS MS: no deformity or atrophy  Skin: warm and dry  Neuro:  Strength and sensation are intact Psych: euthymic mood, full affect  EKG:  EKG is ordered today. The ekg ordered today is personally reviewed and shows sinus rhythm   Recent Labs: 10/19/2017: BUN 18; Creat 0.75; Potassium 4.2; Sodium 140  personally reviewed   Lipid Panel     Component Value Date/Time   CHOL 238 (H) 09/14/2016 0805   TRIG 68.0 09/14/2016 0805   HDL 87.50 09/14/2016 0805   CHOLHDL 3 09/14/2016 0805   VLDL 13.6 09/14/2016 0805   LDLCALC 137 (H) 09/14/2016 0805   LDLDIRECT 131.0 09/03/2009 0813   personally reviewed   Wt Readings from Last 3 Encounters:  11/21/17 109 lb (49.4 kg)  11/01/17 110 lb (49.9 kg)  10/19/17 109 lb 6.4 oz (49.6 kg)      Other studies personally reviewed: Additional studies/ records that were reviewed today include: echo, AF clinic notes, prior event monitor  Review of the above records today demonstrates: as above   ASSESSMENT AND PLAN:  1.  Paroxysmal atrial fibrillation She has failed medical therapy with flecainide. Therapeutic strategies for afib including medicine (multaq, sotalol, and tikosyn) and ablation were discussed in detail with the patient today. Risk, benefits, and alternatives to EP study and radiofrequency ablation for afib were also discussed in detail today. These risks include but are not limited to stroke, bleeding, vascular damage, tamponade, perforation, damage to the esophagus, lungs, and other structures, pulmonary vein stenosis, worsening renal function, and death. The patient understands these risk and wishes to think about this further.  She will contact my office if she decides to proceed.  Otherwise, she will follow-up in AF clinic and with Dr Percival Spanish. If we proceed with  ablation, I would advise TEE rather than CT so that we can further assess her AI prior to ablation. chads2vasc score is at least 3.  Continue eliquis    Current medicines are reviewed at length with the patient today.   The patient does not have concerns regarding her medicines.  The following changes were made today:  none   Signed, Thompson Grayer, MD  11/21/2017 9:12 AM     [facility] [address] [place] [address] [phone] (office) [phone] (fax)

## 2018-01-08 ENCOUNTER — Other Ambulatory Visit: Payer: Self-pay | Admitting: Internal Medicine

## 2018-01-08 MED ORDER — MONTELUKAST SODIUM 10 MG PO TABS: 10.0000 mg | ORAL_TABLET | Freq: Every day | ORAL | 3 refills | Status: DC

## 2018-01-08 NOTE — Telephone Encounter (Signed)
Pt is current with OV's and Rx has been sent as requested. Nothing more needed at this time.

## 2018-01-21 NOTE — Progress Notes (Signed)
HPI The patient presents for follow up of palpitations and aortic insufficiency. She has paroxysmal atrial fibrillation. She has moderate AI.  I sent her for a POET(Plain Old Exercise Treadmill)  (in anticipation that she would need flecainide.)  and she had atrial fib during recovery.  She was cared for by Dr. Claiborne Billings and was given Cardizem.  She had return to NSR later in the evening.   She does have an [employer]ive[employer] to track her HR .  She agreed to start flecainide and has been titrated to 75 mg bid but she did not want to increase to 100 mg despite the fact that she was still having some breakthrough palpitations.  She saw Dr. Rayann Heman since I saw her and he discussed ablation and she is considering this.  Of note rhere is a mention of ST depression on her POET but I reviewed these results personally for this appt and I did not see any ischemic changes.    Since I last saw her she has had no further symptoms.  She had her last atrial fib in Feb.  She is exercising and feeling better.  The patient denies any new symptoms such as chest discomfort, neck or arm discomfort. There has been no new shortness of breath, PND or orthopnea. There have been no reported palpitations, presyncope or syncope.    Allergies  Allergen Reactions  . Codeine Phosphate     drowsy  . Demerol [Meperidine] Nausea Only  . Meperidine Hcl Nausea Only  . Sulfa Antibiotics     Rash    Current Outpatient Medications  Medication Sig Dispense Refill  . acetaminophen (TYLENOL) 500 MG tablet Take 500 mg by mouth as needed.    [name] alendronate (FOSAMAX) 70 MG tablet TAKE 1 TABLET ONCE A WEEK WITH A FULL GLASS OF WATER ON AN EMPTY STOMACH 12 tablet 3  . apixaban (ELIQUIS) 5 MG TABS tablet Take 1 tablet (5 mg total) by mouth 2 (two) times daily. 180 tablet 1  . Calcium Carbonate-Vitamin D (CALCIUM PLUS VITAMIN D PO) Take 1 tablet by mouth daily.     . Cholecalciferol (VITAMIN D3) 1000 units CAPS Take 2 capsules by mouth daily.     [name]  diltiazem (CARTIA XT) 300 MG 24 hr capsule Take 1 capsule (300 mg total) by mouth daily. 90 capsule 1  . flecainide (TAMBOCOR) 50 MG tablet Take 1.5 tablets (75 mg total) by mouth 2 (two) times daily. 270 tablet 2  . ibuprofen (ADVIL,MOTRIN) 800 MG tablet Take 800 mg by mouth 2 (two) times daily as needed for moderate pain.     [name] LORazepam (ATIVAN) 0.5 MG tablet Take 1 tablet (0.5 mg total) by mouth 3 (three) times daily as needed for anxiety. 20 tablet 0  . metoprolol tartrate (LOPRESSOR) 25 MG tablet Take 1 tablet (25 mg total) by mouth 2 (two) times daily. 180 tablet 1  . montelukast (SINGULAIR) 10 MG tablet Take 1 tablet (10 mg total) by mouth daily. 90 tablet 3  . Multiple Vitamins-Minerals (PRESERVISION AREDS PO) Take 1 tablet by mouth 2 (two) times daily.      No current facility-administered medications for this visit.     Past Medical History:  Diagnosis Date  . Allergic rhinitis   . Aortic insufficiency   . CAT SCRATCH 09/10/2009   Qualifier: Diagnosis of  By: Regis Bill MD, [name]   . Clavicle fracture 3/08   left  . Diverticulosis   . HTN (hypertension)   .  Osteopenia   . Paroxysmal atrial fibrillation (HCC)   . Vision disturbance 03/13/2013   Negative neuro MRA MRI    Past Surgical History:  Procedure Laterality Date  . ABDOMINAL HYSTERECTOMY     pt denies 04/15/14  . APPENDECTOMY    . LAPAROSCOPIC SALPINGO OOPHERECTOMY Right   . TONSILLECTOMY AND ADENOIDECTOMY      ROS: As stated in the HPI and negative for all other systems.  PHYSICAL EXAM BP (!) 128/52   Pulse (!) 56   Ht 5\' 5"  (1.651 m)   Wt 110 lb (49.9 kg)   BMI 18.30 kg/m   GENERAL:  Well appearing NECK:  No jugular venous distention, waveform within normal limits, carotid upstroke brisk and symmetric, no bruits, no thyromegaly LUNGS:  Clear to auscultation bilaterally CHEST:  Unremarkable HEART:  PMI not displaced or sustained,S1 and S2 within normal limits, no S3, no S4, no clicks, no rubs, 3/6  diastolic murmur, no systolic murmurs ABD:  Flat, positive bowel sounds normal in frequency in pitch, no bruits, no rebound, no guarding, no midline pulsatile mass, no hepatomegaly, no splenomegaly EXT:  2 plus pulses throughout, no edema, no cyanosis no clubbing   EKG:    NA   ASSESSMENT AND PLAN  Aortic insufficiency -  This was moderate on the echo in October.  I will check this again in Oct.   Atrial fib-  Ms. Idelia D Morrow has a CHA2DS2 - VASc score of 2.    She finally is doing better on the current dose of flecainide.  No change in therapy.   HTN - The blood pressure is at target and she will continue meds as listed.

## 2018-01-23 ENCOUNTER — Ambulatory Visit: Payer: Medicare Other | Admitting: Cardiology

## 2018-01-23 ENCOUNTER — Encounter: Payer: Self-pay | Admitting: Cardiology

## 2018-01-23 VITALS — BP 128/52 | HR 56 | Ht 65.0 in | Wt 110.0 lb

## 2018-01-23 DIAGNOSIS — I351 Nonrheumatic aortic (valve) insufficiency: Secondary | ICD-10-CM | POA: Diagnosis not present

## 2018-01-23 DIAGNOSIS — I480 Paroxysmal atrial fibrillation: Secondary | ICD-10-CM

## 2018-01-23 NOTE — Patient Instructions (Signed)
Medication Instructions:  Continue current medications  If you need a refill on your cardiac medications before your next appointment, please call your pharmacy.  Labwork: None Ordered   Testing/Procedures: Your physician has requested that you have an echocardiogram in October. Echocardiography is a painless test that uses sound waves to create images of your heart. It provides your doctor with information about the size and shape of your heart and how well your heart's chambers and valves are working. This procedure takes approximately one hour. There are no restrictions for this procedure.  Follow-Up: Your physician wants you to follow-up in: October after Echo. You should receive a reminder letter in the mail two months in advance. If you do not receive a letter, please call our office [phone].    Thank you for choosing [facility] at [facility]!!

## 2018-02-13 DIAGNOSIS — Z01419 Encounter for gynecological examination (general) (routine) without abnormal findings: Secondary | ICD-10-CM | POA: Diagnosis not present

## 2018-02-15 ENCOUNTER — Other Ambulatory Visit: Payer: Self-pay | Admitting: Cardiology

## 2018-03-01 ENCOUNTER — Telehealth: Payer: Self-pay | Admitting: Internal Medicine

## 2018-03-01 NOTE — Telephone Encounter (Signed)
Copied from [place] [phone]. Topic: Quick Communication - See Telephone Encounter >> Mar 01, 2018  8:51 AM Ahmed Prima L wrote: CRM for notification. See Telephone encounter for: 03/01/18.  Patient is inquiring about getting the shingles/pneumonia shot. Advised of the shingles shot are not available at this time. Please advise Call back [phone]

## 2018-03-01 NOTE — Telephone Encounter (Signed)
Patient notified that she will need to get Shingrix at the pharmacy due to having Medicare as her primary insurance and Medicare not covering the Shingrix vaccine in the office.  Patient will plan to get Prevnar 13 at next office visit on May 7.

## 2018-03-04 NOTE — Progress Notes (Signed)
Chief Complaint  Patient presents with  . Tinnitus    ringing in both ears - worse in right ear - x 4-5 months.     HPI: [name] [name] 67 y.o. come in for a few things     1.  Since first of year     Has had  Hard to describe ringing in ears .  Like air rushihg sound  Right ear more than left . And not interfering w   converstaion       X silent .  Family member had hearing issues when older   2.  Gyne   Reported  And    Nocturia more than  Baseline.     ? If age or other     Pelvic floor PT.  [facility]  Called her and has appt for  June 3 at 11 am   Vaccine   Shingles   and prevnar 13  ?s  What needed    has been battling  A fib .   consdiering  Ablation and none recently . oj flecainide.   No recent cpx and monitoring   Refill on hand for travel lorazepam if needed   ROS: See pertinent positives and negatives per HPI. No cp sob  Falling   Taking vit d   Past Medical History:  Diagnosis Date  . Allergic rhinitis   . Aortic insufficiency   . CAT SCRATCH 09/10/2009   Qualifier: Diagnosis of  By: Regis Bill MD, [name]   . Clavicle fracture 3/08   left  . Diverticulosis   . HTN (hypertension)   . Osteopenia   . Paroxysmal atrial fibrillation (HCC)   . Vision disturbance 03/13/2013   Negative neuro MRA MRI    Family History  Problem Relation Age of Onset  . Scleroderma Mother   . Hyperlipidemia Mother   . Coronary artery disease Father 27  . Esophageal cancer Paternal Grandmother     Social History   Socioeconomic History  . Marital status: Married    Spouse name: Not on file  . Number of children: 0  . Years of education: Not on file  . Highest education level: Not on file  Occupational History  . Occupation: Veterinary surgeon  . Occupation: REPORTER    Employer: [employer]  . Financial resource strain: Not on file  . Food insecurity:    Worry: Not on file    Inability: Not on file  . Transportation needs:    Medical: Not on file    Non-medical: Not on file  Tobacco Use  . Smoking status: Never Smoker  . Smokeless tobacco: Never Used  Substance and Sexual Activity  . Alcohol use: No    Comment: never  . Drug use: No  . Sexual activity: Not on file  Lifestyle  . Physical activity:    Days per week: Not on file    Minutes per session: Not on file  . Stress: Not on file  Relationships  . Social connections:    Talks on phone: Not on file    Gets together: Not on file    Attends religious service: Not on file    Active member of club or organization: Not on file    Attends meetings of clubs or organizations: Not on file    Relationship status: Not on file  Other Topics Concern  . Not on file  Social History Narrative   Married   hh  of 2    p0g0   Works for [employer]     Outpatient Medications Prior to Visit  Medication Sig Dispense Refill  . acetaminophen (TYLENOL) 500 MG tablet Take 500 mg by mouth as needed.    [name] alendronate (FOSAMAX) 70 MG tablet TAKE 1 TABLET ONCE A WEEK WITH A FULL GLASS OF WATER ON AN EMPTY STOMACH 12 tablet 3  . apixaban (ELIQUIS) 5 MG TABS tablet Take 1 tablet (5 mg total) by mouth 2 (two) times daily. 180 tablet 1  . Calcium Carbonate-Vitamin D (CALCIUM PLUS VITAMIN D PO) Take 1 tablet by mouth daily.     . Cholecalciferol (VITAMIN D3) 1000 units CAPS Take 2 capsules by mouth daily.     [name] diltiazem (CARDIZEM CD) 300 MG 24 hr capsule TAKE 1 CAPSULE BY MOUTH  DAILY 90 capsule 1  . flecainide (TAMBOCOR) 50 MG tablet Take 1.5 tablets (75 mg total) by mouth 2 (two) times daily. 270 tablet 2  . ibuprofen (ADVIL,MOTRIN) 800 MG tablet Take 800 mg by mouth 2 (two) times daily as needed for moderate pain.     . metoprolol tartrate (LOPRESSOR) 25 MG tablet TAKE 1 TABLET BY MOUTH TWO  TIMES DAILY 180 tablet 1  . montelukast (SINGULAIR) 10 MG tablet Take 1 tablet (10 mg total) by mouth daily. 90 tablet 3  . Multiple Vitamins-Minerals (PRESERVISION AREDS PO) Take 1 tablet by mouth 2 (two)  times daily.     [name] LORazepam (ATIVAN) 0.5 MG tablet Take 1 tablet (0.5 mg total) by mouth 3 (three) times daily as needed for anxiety. 20 tablet 0   No facility-administered medications prior to visit.      EXAM:  BP 122/68 (BP Location: Right Arm, Patient Position: Sitting, Cuff Size: Normal)   Pulse 65   Temp 98.3 F (36.8 C) (Oral)   Wt 111 lb 6.4 oz (50.5 kg)   BMI 18.54 kg/m   Body mass index is 18.54 kg/m.  GENERAL: vitals reviewed and listed above, alert, oriented, appears well hydrated and in no acute distress HEENT: atraumatic, conjunctiva  clear, no obvious abnormalities on inspection of external nose and ears   tms clear OP : no lesion edema or exudate  NECK: no obvious masses on inspection palpation  No bruits  LUNGS: clear to auscultation bilaterally, no wheezes, rales or rhonchi, good air movement CV: HRRR, no clubbing cyanosis or  peripheral edema nl cap refill  MS: moves all extremities without noticeable focal  Abnormality Neuro grossly intact.  PSYCH: pleasant and cooperative, no obvious depression or anxiety Lab Results  Component Value Date   WBC 4.6 09/14/2016   HGB 14.6 09/14/2016   HCT 43.6 09/14/2016   PLT 243.0 09/14/2016   GLUCOSE 83 10/19/2017   CHOL 238 (H) 09/14/2016   TRIG 68.0 09/14/2016   HDL 87.50 09/14/2016   LDLDIRECT 131.0 09/03/2009   LDLCALC 137 (H) 09/14/2016   ALT 15 09/14/2016   AST 20 09/14/2016   NA 140 10/19/2017   K 4.2 10/19/2017   CL 103 10/19/2017   CREATININE 0.75 10/19/2017   BUN 18 10/19/2017   CO2 31 10/19/2017   TSH 1.22 09/14/2016   BP Readings from Last 3 Encounters:  03/05/18 122/68  01/23/18 (!) 128/52  11/21/17 136/62    ASSESSMENT AND PLAN:  Discussed the following assessment and plan:  Tinnitus of both ears - assymmetrical but no other alarm features needs audiologic ent   eval  cautioned that may not  be  intervnetion x hearing protection - Plan: Ambulatory referral to ENT  Nocturia - agree with  uro PT  Need for pneumococcal vaccination - Plan: Pneumococcal conjugate vaccine 13-valent  Medication management - cautious  rare uise as needed for med with flying to send in to pharmacy   Paroxysmal atrial fibrillation ([place]) - medication control reviewed issues and  immunizations     -Patient advised to return or notify health care team  if  new concerns arise.  Patient Instructions  Will plan on good hearing   ent evaluation  For discussed .  Plan on cpx    Medicare   When  Things are  Steady     I agree with  tthe urology PT pelvic  Floor referral  Through urology .  Tinnitus Tinnitus refers to hearing a sound when there is no actual source for that sound. This is often described as ringing in the ears. However, people with this condition may hear a variety of noises. A person may hear the sound in one ear or in both ears. The sounds of tinnitus can be soft, loud, or somewhere in between. Tinnitus can last for a few seconds or can be constant for days. It may go away without treatment and come back at various times. When tinnitus is constant or happens often, it can lead to other problems, such as trouble sleeping and trouble concentrating. Almost everyone experiences tinnitus at some point. Tinnitus that is long-lasting (chronic) or comes back often is a problem that may require medical attention. What are the causes? The cause of tinnitus is often not known. In some cases, it can result from other problems or conditions, including:  Exposure to loud noises from machinery, music, or other sources.  Hearing loss.  Ear or sinus infections.  Earwax buildup.  A foreign object in the ear.  Use of certain medicines.  Use of alcohol and caffeine.  High blood pressure.  Heart diseases.  Anemia.  Allergies.  Meniere disease.  Thyroid problems.  Tumors.  An enlarged part of a weakened blood vessel (aneurysm).  What are the signs or symptoms? The main symptom of  tinnitus is hearing a sound when there is no source for that sound. It may sound like:  Buzzing.  Roaring.  Ringing.  Blowing air, similar to the sound heard when you listen to a seashell.  Hissing.  Whistling.  Sizzling.  Humming.  Running water.  A sustained musical note.  How is this diagnosed? Tinnitus is diagnosed based on your symptoms. Your health care provider will do a physical exam. A comprehensive hearing exam (audiologic exam) will be done if your tinnitus:  Affects only one ear (unilateral).  Causes hearing difficulties.  Lasts 6 months or longer.  You may also need to see a health care provider who specializes in hearing disorders (audiologist). You may be asked to complete a questionnaire to determine the severity of your tinnitus. Tests may be done to help determine the cause and to rule out other conditions. These can include:  Imaging studies of your head and brain, such as: ? A CT scan. ? An MRI.  An imaging study of your blood vessels (angiogram).  How is this treated? Treating an underlying medical condition can sometimes make tinnitus go away. If your tinnitus continues, other treatments may include:  Medicines, such as certain antidepressants or sleeping aids.  Sound generators to mask the tinnitus. These include: ? Tabletop sound machines that play relaxing sounds to  help you fall asleep. ? Wearable devices that fit in your ear and play sounds or music. ? A small device that uses headphones to deliver a signal embedded in music (acoustic neural stimulation). In time, this may change the pathways of your brain and make you less sensitive to tinnitus. This device is used for very severe cases when no other treatment is working.  Therapy and counseling to help you manage the stress of living with tinnitus.  Using hearing aids or cochlear implants, if your tinnitus is related to hearing loss.  Follow these instructions at home:  When  possible, avoid being in loud places and being exposed to loud sounds.  Wear hearing protection, such as earplugs, when you are exposed to loud noises.  Do not take stimulants, such as nicotine, alcohol, or caffeine.  Practice techniques for reducing stress, such as meditation, yoga, or deep breathing.  Use a white noise machine, a humidifier, or other devices to mask the sound of tinnitus.  Sleep with your head slightly raised. This may reduce the impact of tinnitus.  Try to get plenty of rest each night. Contact a health care provider if:  You have tinnitus in just one ear.  Your tinnitus continues for 3 weeks or longer without stopping.  Home care measures are not helping.  You have tinnitus after a head injury.  You have tinnitus along with any of the following: ? Dizziness. ? Loss of balance. ? Nausea and vomiting. This information is not intended to replace advice given to you by your health care provider. Make sure you discuss any questions you have with your health care provider. Document Released: 10/16/2005 Document Revised: 06/18/2016 Document Reviewed: 03/18/2014 Elsevier Interactive Patient Education  2018 [employer]. Should get shingrix vaccine at  [place]     .            [name]. Amahia Madonia M.D.

## 2018-03-05 ENCOUNTER — Encounter: Payer: Self-pay | Admitting: Internal Medicine

## 2018-03-05 ENCOUNTER — Ambulatory Visit (INDEPENDENT_AMBULATORY_CARE_PROVIDER_SITE_OTHER): Payer: Medicare Other | Admitting: Internal Medicine

## 2018-03-05 VITALS — BP 122/68 | HR 65 | Temp 98.3°F | Wt 111.4 lb

## 2018-03-05 DIAGNOSIS — R351 Nocturia: Secondary | ICD-10-CM | POA: Diagnosis not present

## 2018-03-05 DIAGNOSIS — Z23 Encounter for immunization: Secondary | ICD-10-CM

## 2018-03-05 DIAGNOSIS — H9313 Tinnitus, bilateral: Secondary | ICD-10-CM | POA: Diagnosis not present

## 2018-03-05 DIAGNOSIS — I480 Paroxysmal atrial fibrillation: Secondary | ICD-10-CM

## 2018-03-05 DIAGNOSIS — Z79899 Other long term (current) drug therapy: Secondary | ICD-10-CM | POA: Diagnosis not present

## 2018-03-05 MED ORDER — LORAZEPAM 0.5 MG PO TABS: 0.5000 mg | ORAL_TABLET | Freq: Three times a day (TID) | ORAL | 0 refills | Status: DC | PRN

## 2018-03-05 MED ORDER — ZOSTER VAC RECOMB ADJUVANTED 50 MCG/0.5ML IM SUSR: 0.5000 mL | Freq: Once | INTRAMUSCULAR | 1 refills | Status: AC

## 2018-03-05 NOTE — Patient Instructions (Addendum)
Will plan on good hearing   ent evaluation  For discussed .  Plan on cpx    Medicare   When  Things are  Steady     I agree with  tthe urology PT pelvic  Floor referral  Through urology .  Tinnitus Tinnitus refers to hearing a sound when there is no actual source for that sound. This is often described as ringing in the ears. However, people with this condition may hear a variety of noises. A person may hear the sound in one ear or in both ears. The sounds of tinnitus can be soft, loud, or somewhere in between. Tinnitus can last for a few seconds or can be constant for days. It may go away without treatment and come back at various times. When tinnitus is constant or happens often, it can lead to other problems, such as trouble sleeping and trouble concentrating. Almost everyone experiences tinnitus at some point. Tinnitus that is long-lasting (chronic) or comes back often is a problem that may require medical attention. What are the causes? The cause of tinnitus is often not known. In some cases, it can result from other problems or conditions, including:  Exposure to loud noises from machinery, music, or other sources.  Hearing loss.  Ear or sinus infections.  Earwax buildup.  A foreign object in the ear.  Use of certain medicines.  Use of alcohol and caffeine.  High blood pressure.  Heart diseases.  Anemia.  Allergies.  Meniere disease.  Thyroid problems.  Tumors.  An enlarged part of a weakened blood vessel (aneurysm).  What are the signs or symptoms? The main symptom of tinnitus is hearing a sound when there is no source for that sound. It may sound like:  Buzzing.  Roaring.  Ringing.  Blowing air, similar to the sound heard when you listen to a seashell.  Hissing.  Whistling.  Sizzling.  Humming.  Running water.  A sustained musical note.  How is this diagnosed? Tinnitus is diagnosed based on your symptoms. Your health care provider will do a  physical exam. A comprehensive hearing exam (audiologic exam) will be done if your tinnitus:  Affects only one ear (unilateral).  Causes hearing difficulties.  Lasts 6 months or longer.  You may also need to see a health care provider who specializes in hearing disorders (audiologist). You may be asked to complete a questionnaire to determine the severity of your tinnitus. Tests may be done to help determine the cause and to rule out other conditions. These can include:  Imaging studies of your head and brain, such as: ? A CT scan. ? An MRI.  An imaging study of your blood vessels (angiogram).  How is this treated? Treating an underlying medical condition can sometimes make tinnitus go away. If your tinnitus continues, other treatments may include:  Medicines, such as certain antidepressants or sleeping aids.  Sound generators to mask the tinnitus. These include: ? Tabletop sound machines that play relaxing sounds to help you fall asleep. ? Wearable devices that fit in your ear and play sounds or music. ? A small device that uses headphones to deliver a signal embedded in music (acoustic neural stimulation). In time, this may change the pathways of your brain and make you less sensitive to tinnitus. This device is used for very severe cases when no other treatment is working.  Therapy and counseling to help you manage the stress of living with tinnitus.  Using hearing aids or cochlear implants, if  your tinnitus is related to hearing loss.  Follow these instructions at home:  When possible, avoid being in loud places and being exposed to loud sounds.  Wear hearing protection, such as earplugs, when you are exposed to loud noises.  Do not take stimulants, such as nicotine, alcohol, or caffeine.  Practice techniques for reducing stress, such as meditation, yoga, or deep breathing.  Use a white noise machine, a humidifier, or other devices to mask the sound of tinnitus.  Sleep  with your head slightly raised. This may reduce the impact of tinnitus.  Try to get plenty of rest each night. Contact a health care provider if:  You have tinnitus in just one ear.  Your tinnitus continues for 3 weeks or longer without stopping.  Home care measures are not helping.  You have tinnitus after a head injury.  You have tinnitus along with any of the following: ? Dizziness. ? Loss of balance. ? Nausea and vomiting. This information is not intended to replace advice given to you by your health care provider. Make sure you discuss any questions you have with your health care provider. Document Released: 10/16/2005 Document Revised: 06/18/2016 Document Reviewed: 03/18/2014 Elsevier Interactive Patient Education  2018 [employer]. Should get shingrix vaccine at  [place]     .

## 2018-03-19 ENCOUNTER — Ambulatory Visit: Payer: Medicare Other | Admitting: Internal Medicine

## 2018-03-19 ENCOUNTER — Encounter: Payer: Self-pay | Admitting: Internal Medicine

## 2018-03-19 DIAGNOSIS — J301 Allergic rhinitis due to pollen: Secondary | ICD-10-CM

## 2018-03-19 DIAGNOSIS — I480 Paroxysmal atrial fibrillation: Secondary | ICD-10-CM

## 2018-03-19 NOTE — Assessment & Plan Note (Signed)
She seems to be managing very nicely with Singulair, occasional Flonase or antihistamine.  I suggested that she could get Singulair refilled in the future by her primary physician and come back to see [place] if needed since this would be simpler a cheaper for her.

## 2018-03-19 NOTE — Patient Instructions (Signed)
Your Singulair script should be good for a year. You can get future refills through your primary care giver if that is all you need. We will be very hapy to see you again if you need our help.

## 2018-03-19 NOTE — Assessment & Plan Note (Signed)
Currently controlled with slow pulse noted on exam at this visit.  She is managed by cardiology and seems to be feeling well.

## 2018-03-19 NOTE — Progress Notes (Signed)
Patient ID: [name] [name], female    DOB: 1951/06/12, 67 y.o.   MRN: [id number]  HPI female never smoker followed for allergic rhinitis [employer] for [employer]), complicated by PAF  ----------------------------------------------------------------------------------------------- 03/16/2016-67 year old female never smoker followed for allergic rhinitis [employer]), complicated by PAF FOLLOWS FOR: Pt states she started having tooth pain,upper right side of mouth;Dentist feels its related to sinus issues and not dental. Friday last week it became more painful-no fullness or pain in sinus cavity. Began augmentin 5/18 CXR 08/11/16-IMPRESSION: Clearing of previously noted right-sided pulmonary infiltrate. Lungs are clear on the current exam Developed pain in right upper jaw at the end of last week. She called her dentist thought from description it was more likely to be a sinus problem, so she called [place] and was sent Augmentin. She may have had minimal cold symptoms midweek but no significant nasal congestion or discharge and no discomfort with her right ear. She is somewhat better now halfway through Augmentin but has been favoring the right side of her mouth with chewing etc. She continues Singulair without substantial spring rhinitis symptoms this year.  03/19/2018- 67 year old female never smoker followed for allergic rhinitis [employer]), complicated by PAF, HBP, macular degeneration,  ----Allergic Rhinitis: Pt states she is doing well overall and needs Rx for Singulair No significant respiratory symptoms or exacerbation.  Has been dealing with atrial fibrillation, now on flecainide.  We discussed possibility that our meds might affect AFib cocntrol, and to let us know if cardiology concerned.   ROS-see HPI       + = positive Constitutional:   No-   weight loss, night sweats, fevers, chills, fatigue, lassitude. HEENT:   No-  headaches, difficulty  swallowing, tooth/dental problems, sore throat,       No-  sneezing, itching, ear ache, nasal congestion, +post nasal drip,  CV:  No-   chest pain, orthopnea, PND, swelling in lower extremities, anasarca, dizziness, palpitations Resp: No-   shortness of breath with exertion or at rest.              No-   productive cough,  No non-productive cough,  No- coughing up of blood.              No-   change in color of mucus.  No- wheezing.   Skin: No-   rash or lesions. GI:  No-   heartburn, indigestion, abdominal pain, nausea, vomiting,  GU:  MS:  No-   joint pain or swelling.   Neuro-     nothing unusual Psych:  No- change in mood or affect. No depression or anxiety.  No memory loss.   OBJ- Physical Exam General- Alert, Oriented, Affect-appropriate, Distress- none acute .  Trim. Skin- rash-none, lesions- none, excoriation- none Lymphadenopathy- none Head- atraumatic            Eyes- Gross vision intact, PERRLA, conjunctivae and secretions clear            Ears- Hearing, canals-normal            Nose- Clear, pale, no-Septal dev, mucus, polyps, erosion, perforation             Throat- Mallampati III , mucosa clear , drainage- none, tonsils- atrophic Neck- flexible , trachea midline, no stridor , thyroid nl, carotid no bruit Chest - symmetrical excursion , unlabored           Heart/CV- RRR/ +slow , trace diastolic  murmur L sternum , no gallop  , no rub, nl s1 s2                           - JVD- none , edema- none, stasis changes- none, varices- none           Lung- clear to P&A, wheeze- none, cough- none , dullness-none, rub- none           Chest wall-  Abd- Br/ Gen/ Rectal- Not done, not indicated Extrem- cyanosis- none, clubbing, none, atrophy- none, strength- nl Neuro- grossly intact to observation

## 2018-03-21 DIAGNOSIS — H9313 Tinnitus, bilateral: Secondary | ICD-10-CM | POA: Diagnosis not present

## 2018-03-21 DIAGNOSIS — H903 Sensorineural hearing loss, bilateral: Secondary | ICD-10-CM | POA: Insufficient documentation

## 2018-03-22 ENCOUNTER — Encounter: Payer: Self-pay | Admitting: Cardiology

## 2018-04-02 DIAGNOSIS — M62838 Other muscle spasm: Secondary | ICD-10-CM | POA: Diagnosis not present

## 2018-04-02 DIAGNOSIS — M6281 Muscle weakness (generalized): Secondary | ICD-10-CM | POA: Diagnosis not present

## 2018-04-11 DIAGNOSIS — M6289 Other specified disorders of muscle: Secondary | ICD-10-CM | POA: Diagnosis not present

## 2018-04-11 DIAGNOSIS — M6281 Muscle weakness (generalized): Secondary | ICD-10-CM | POA: Diagnosis not present

## 2018-04-11 DIAGNOSIS — M62838 Other muscle spasm: Secondary | ICD-10-CM | POA: Diagnosis not present

## 2018-04-19 DIAGNOSIS — M6281 Muscle weakness (generalized): Secondary | ICD-10-CM | POA: Diagnosis not present

## 2018-04-19 DIAGNOSIS — M62838 Other muscle spasm: Secondary | ICD-10-CM | POA: Diagnosis not present

## 2018-04-19 DIAGNOSIS — M6289 Other specified disorders of muscle: Secondary | ICD-10-CM | POA: Diagnosis not present

## 2018-04-21 ENCOUNTER — Other Ambulatory Visit (HOSPITAL_COMMUNITY): Payer: Self-pay | Admitting: Nurse Practitioner

## 2018-04-29 DIAGNOSIS — M62838 Other muscle spasm: Secondary | ICD-10-CM | POA: Diagnosis not present

## 2018-04-29 DIAGNOSIS — M6289 Other specified disorders of muscle: Secondary | ICD-10-CM | POA: Diagnosis not present

## 2018-05-15 ENCOUNTER — Encounter: Payer: Self-pay | Admitting: Internal Medicine

## 2018-05-15 NOTE — Progress Notes (Signed)
Chief Complaint  Patient presents with  . Annual Exam    HPI: [name] [name] 67 y.o. comes in today for welcome to medicare  visit .Since last visit.I  Allergy stable  Breathing resp   Ai a fib  On anticoag and antiarrythmic  AIfollowed   Osteo[orosis on  fosomax vit d last check was ok   bp not checking that much   tiniitus eval   Getting some puffy feet ankles recnetlhy without pain goes away in am with elevation  No lesions Health Maintenance  Topic Date Due  . TETANUS/TDAP  05/21/2017  . PAP SMEAR  01/25/2018  . INFLUENZA VACCINE  05/30/2018  . PNA vac Low Risk Adult (2 of 2 - PPSV23) 03/06/2019  . MAMMOGRAM  07/10/2019  . COLONOSCOPY  04/20/2024  . DEXA SCAN  Completed  . Hepatitis C Screening  Completed  . HIV Screening  Completed   Health Maintenance Review LIFESTYLE:  Exercise:   Not enough ?  zumba and walks in evening .  Pelvic exercises.   Tobacco/ETS: no Alcohol:   None  Sugar beverages: no Sleep: 6.5- 7.5  Drug use: no Works 45 hours per week  HH: 2 2 cats    Hearing: went for exam for tinnitus  Mild high pitch.  Loss distracting .   Vision:  No limitations at present . Last eye check UTD  Glasses   Safety:  Has smoke detector and wears seat belts.  No firearms. No excess sun exposure. Sees dentist regularly.  Falls: no  Advance directive :  Reviewed  Has one.  Memory: Felt to be good  , no concern from her or her family.  Depression: No anhedonia unusual crying or depressive symptoms  Nutrition: Eats well balanced diet; adequate calcium and vitamin D. No swallowing chewing problems.  Injury: no major injuries in the last six months.  Other healthcare providers:  Reviewed today .  Social:  Lives with spouse married.    Preventive parameters: up-to-date  Reviewed   ADLS:   There are no problems or need for assistance  driving, feeding, obtaining food, dressing, toileting and bathing, managing money using phone. She is  independent.   ROS: see hpi  GEN/ HEENT: No fever, significant weight changes sweats headaches vision problems hearing changes,see above  CV/ PULM; No chest pain shortness of breath cough, syncope,edema  change in exercise tolerance. GI /GU: No adominal pain, vomiting, change in bowel habits. No blood in the stool. No significant GU symptoms. SKIN/HEME: ,no acute skin rashes suspicious lesions or bleeding. No lymphadenopathy, nodules, masses.  NEURO/ PSYCH:  No neurologic signs such as weakness numbness. No depression anxiety. IMM/ Allergy: No unusual infections.  Allergy .   REST of 12 system review negative except as per HPI   Past Medical History:  Diagnosis Date  . Allergic rhinitis   . Aortic insufficiency   . CAT SCRATCH 09/10/2009   Qualifier: Diagnosis of  By: Regis Bill MD, [name]   . Clavicle fracture 3/08   left  . Diverticulosis   . HTN (hypertension)   . Osteopenia   . Paroxysmal atrial fibrillation (HCC)   . Vision disturbance 03/13/2013   Negative neuro MRA MRI    Family History  Problem Relation Age of Onset  . Scleroderma Mother   . Hyperlipidemia Mother   . Coronary artery disease Father 69  . Esophageal cancer Paternal Grandmother     Social History   Socioeconomic History  . Marital status:  Married    Spouse name: Not on file  . Number of children: 0  . Years of education: Not on file  . Highest education level: Not on file  Occupational History  . Occupation: Veterinary surgeon  . Occupation: REPORTER    Employer: [employer]  . Financial resource strain: Not on file  . Food insecurity:    Worry: Not on file    Inability: Not on file  . Transportation needs:    Medical: Not on file    Non-medical: Not on file  Tobacco Use  . Smoking status: Never Smoker  . Smokeless tobacco: Never Used  Substance and Sexual Activity  . Alcohol use: No    Comment: never  . Drug use: No  . Sexual activity: Not on file  Lifestyle  .  Physical activity:    Days per week: Not on file    Minutes per session: Not on file  . Stress: Not on file  Relationships  . Social connections:    Talks on phone: Not on file    Gets together: Not on file    Attends religious service: Not on file    Active member of club or organization: Not on file    Attends meetings of clubs or organizations: Not on file    Relationship status: Not on file  Other Topics Concern  . Not on file  Social History Narrative   Married   hh of 2    p0g0   Works for [employer]     Outpatient Encounter Medications as of 05/17/2018  Medication Sig  . acetaminophen (TYLENOL) 500 MG tablet Take 500 mg by mouth as needed.  [name] alendronate (FOSAMAX) 70 MG tablet TAKE 1 TABLET ONCE A WEEK WITH A FULL GLASS OF WATER ON AN EMPTY STOMACH  . apixaban (ELIQUIS) 5 MG TABS tablet Take 1 tablet (5 mg total) by mouth 2 (two) times daily.  . Calcium Carbonate-Vitamin D (CALCIUM PLUS VITAMIN D PO) Take 1 tablet by mouth daily.   . Cholecalciferol (VITAMIN D3) 1000 units CAPS Take 2 capsules by mouth daily.   . flecainide (TAMBOCOR) 50 MG tablet TAKE 1 AND 1/2 TABLETS BY  MOUTH TWO TIMES DAILY (DOSE INCREASE)  . ibuprofen (ADVIL,MOTRIN) 800 MG tablet Take 800 mg by mouth 2 (two) times daily as needed for moderate pain.   [name] LORazepam (ATIVAN) 0.5 MG tablet Take 1 tablet (0.5 mg total) by mouth 3 (three) times daily as needed for anxiety. For travel  . metoprolol tartrate (LOPRESSOR) 25 MG tablet TAKE 1 TABLET BY MOUTH TWO  TIMES DAILY  . montelukast (SINGULAIR) 10 MG tablet Take 1 tablet (10 mg total) by mouth daily.  . Multiple Vitamins-Minerals (PRESERVISION AREDS PO) Take 1 tablet by mouth 2 (two) times daily.   [name] diltiazem (CARDIZEM CD) 300 MG 24 hr capsule TAKE 1 CAPSULE BY MOUTH  DAILY   No facility-administered encounter medications on file as of 05/17/2018.     EXAM:  BP (!) 142/68   Pulse (!) 54   Temp 98.1 F (36.7 C)   Ht 5' 4.5" (1.638 m)   Wt 109  lb (49.4 kg)   BMI 18.42 kg/m   Body mass index is 18.42 kg/m.  Physical Exam: Vital signs reviewed XLK:GMWN is a well-developed well-nourished alert cooperative   who appears stated age in no acute distress.  HEENT: normocephalic atraumatic , Eyes: PERRL EOM's full, conjunctiva clear, Nares: paten,t no deformity  discharge or tenderness., Ears: no deformity EAC's clear TMs with normal landmarks. Mouth: clear OP, no lesions, edema.  Moist mucous membranes. Dentition in adequate repair. NECK: supple without masses, thyromegaly or bruits. CHEST/PULM:  Clear to auscultation and percussion breath sounds equal no wheeze , rales or rhonchi. No chest wall deformities or tenderness. Breast: normal by inspection . No dimpling, discharge, masses, tenderness or discharge . CV: PMI is nondisplaced, S1 S2 no gallops,  Faint a1 m 2/6 murmurs, rubs. Peripheral pulses are full without delay.No JVD .  ABDOMEN: Bowel sounds normal nontender  No guard or rebound, no hepato splenomegal no CVA tenderness.   Extremtities:  No clubbing cyanosis slight edmea ankles   Superficial veins no lsision  edema, no acute joint swelling or redness no focal atrophy has bunions  NEURO:  Oriented x3, cranial nerves 3-12 appear to be intact, no obvious focal weakness,gait within normal limits no abnormal reflexes or asymmetrical SKIN: No acute rashes normal turgor, color, no bruising or petechiae.  rough linear skin under bra line PSYCH: Oriented, good eye contact, no obvious depression anxiety, cognition and judgment appear normal. LN: no cervical axillary inguinal adenopathy No noted deficits in memory, attention, and speech.   ASSESSMENT AND PLAN:  Discussed the following assessment and plan:  Visit for preventive health examination  Medication management - Plan: Basic metabolic panel, CBC with Differential/Platelet, Hepatic function panel, Lipid panel, TSH  Paroxysmal atrial fibrillation (HCC) - Plan: Basic metabolic  panel, CBC with Differential/Platelet, Hepatic function panel, Lipid panel, TSH  Osteoporosis without current pathological fracture, unspecified osteoporosis type - Plan: Basic metabolic panel, CBC with Differential/Platelet, Hepatic function panel, Lipid panel, TSH  Elevated cholesterol - Plan: Basic metabolic panel, CBC with Differential/Platelet, Hepatic function panel, Lipid panel, TSH  Vitamin D deficiencymonitoring  meds not taken today  Ensure bp control   Yearly exam planned  And as needed PV reviewed  Edema no flags seems not obstructive or  chf other   Follow  If worse  Lab today  Patient Care Team: Zarin Hagmann, [name], MD as PCP - General Clent Jacks, MD (Ophthalmology) Maia Breslow, MD (Orthopedic Surgery) Minus Breeding, MD as Consulting Physician (Cardiology)  Patient Instructions  Swelling  Seems to be from gravity and poss VVeins  Consider comrpession stockings in day ( knee socks)  Exercise   Fu if progressive.   Will notify you  of labs when available.   Pneumovax 23 next may or after    Health Maintenance, Female Adopting a healthy lifestyle and getting preventive care can go a long way to promote health and wellness. Talk with your health care provider about what schedule of regular examinations is right for you. This is a good chance for you to check in with your provider about disease prevention and staying healthy. In between checkups, there are plenty of things you can do on your own. Experts have done a lot of research about which lifestyle changes and preventive measures are most likely to keep you healthy. Ask your health care provider for more information. Weight and diet Eat a healthy diet  Be sure to include plenty of vegetables, fruits, low-fat dairy products, and lean protein.  Do not eat a lot of foods high in solid fats, added sugars, or salt.  Get regular exercise. This is one of the most important things you can do for your health. ? Most adults  should exercise for at least 150 minutes each week. The exercise should increase your heart rate  and make you sweat (moderate-intensity exercise). ? Most adults should also do strengthening exercises at least twice a week. This is in addition to the moderate-intensity exercise.  Maintain a healthy weight  Body mass index (BMI) is a measurement that can be used to identify possible weight problems. It estimates body fat based on height and weight. Your health care provider can help determine your BMI and help you achieve or maintain a healthy weight.  For females 16 years of age and older: ? A BMI below 18.5 is considered underweight. ? A BMI of 18.5 to 24.9 is normal. ? A BMI of 25 to 29.9 is considered overweight. ? A BMI of 30 and above is considered obese.  Watch levels of cholesterol and blood lipids  You should start having your blood tested for lipids and cholesterol at 66 years of age, then have this test every 5 years.  You may need to have your cholesterol levels checked more often if: ? Your lipid or cholesterol levels are high. ? You are older than 66 years of age. ? You are at high risk for heart disease.  Cancer screening Lung Cancer  Lung cancer screening is recommended for adults 70-2 years old who are at high risk for lung cancer because of a history of smoking.  A yearly low-dose CT scan of the lungs is recommended for people who: ? Currently smoke. ? Have quit within the past 15 years. ? Have at least a 30-pack-year history of smoking. A pack year is smoking an average of one pack of cigarettes a day for 1 year.  Yearly screening should continue until it has been 15 years since you quit.  Yearly screening should stop if you develop a health problem that would prevent you from having lung cancer treatment.  Breast Cancer  Practice breast self-awareness. This means understanding how your breasts normally appear and feel.  It also means doing regular breast  self-exams. Let your health care provider know about any changes, no matter how small.  If you are in your 20s or 30s, you should have a clinical breast exam (CBE) by a health care provider every 1-3 years as part of a regular health exam.  If you are 47 or older, have a CBE every year. Also consider having a breast X-ray (mammogram) every year.  If you have a family history of breast cancer, talk to your health care provider about genetic screening.  If you are at high risk for breast cancer, talk to your health care provider about having an MRI and a mammogram every year.  Breast cancer gene (BRCA) assessment is recommended for women who have family members with BRCA-related cancers. BRCA-related cancers include: ? Breast. ? Ovarian. ? Tubal. ? Peritoneal cancers.  Results of the assessment will determine the need for genetic counseling and BRCA1 and BRCA2 testing.  Cervical Cancer Your health care provider may recommend that you be screened regularly for cancer of the pelvic organs (ovaries, uterus, and vagina). This screening involves a pelvic examination, including checking for microscopic changes to the surface of your cervix (Pap test). You may be encouraged to have this screening done every 3 years, beginning at age 22.  For women ages 57-65, health care providers may recommend pelvic exams and Pap testing every 3 years, or they may recommend the Pap and pelvic exam, combined with testing for human papilloma virus (HPV), every 5 years. Some types of HPV increase your risk of cervical cancer. Testing for HPV  may also be done on women of any age with unclear Pap test results.  Other health care providers may not recommend any screening for nonpregnant women who are considered low risk for pelvic cancer and who do not have symptoms. Ask your health care provider if a screening pelvic exam is right for you.  If you have had past treatment for cervical cancer or a condition that could  lead to cancer, you need Pap tests and screening for cancer for at least 20 years after your treatment. If Pap tests have been discontinued, your risk factors (such as having a new sexual partner) need to be reassessed to determine if screening should resume. Some women have medical problems that increase the chance of getting cervical cancer. In these cases, your health care provider may recommend more frequent screening and Pap tests.  Colorectal Cancer  This type of cancer can be detected and often prevented.  Routine colorectal cancer screening usually begins at 66 years of age and continues through 66 years of age.  Your health care provider may recommend screening at an earlier age if you have risk factors for colon cancer.  Your health care provider may also recommend using home test kits to check for hidden blood in the stool.  A small camera at the end of a tube can be used to examine your colon directly (sigmoidoscopy or colonoscopy). This is done to check for the earliest forms of colorectal cancer.  Routine screening usually begins at age 79.  Direct examination of the colon should be repeated every 5-10 years through 66 years of age. However, you may need to be screened more often if early forms of precancerous polyps or small growths are found.  Skin Cancer  Check your skin from head to toe regularly.  Tell your health care provider about any new moles or changes in moles, especially if there is a change in a mole's shape or color.  Also tell your health care provider if you have a mole that is larger than the size of a pencil eraser.  Always use sunscreen. Apply sunscreen liberally and repeatedly throughout the day.  Protect yourself by wearing long sleeves, pants, a wide-brimmed hat, and sunglasses whenever you are outside.  Heart disease, diabetes, and high blood pressure  High blood pressure causes heart disease and increases the risk of stroke. High blood pressure  is more likely to develop in: ? People who have blood pressure in the high end of the normal range (130-139/85-89 mm Hg). ? People who are overweight or obese. ? People who are African American.  If you are 50-21 years of age, have your blood pressure checked every 3-5 years. If you are 26 years of age or older, have your blood pressure checked every year. You should have your blood pressure measured twice-once when you are at a hospital or clinic, and once when you are not at a hospital or clinic. Record the average of the two measurements. To check your blood pressure when you are not at a hospital or clinic, you can use: ? An automated blood pressure machine at a pharmacy. ? A home blood pressure monitor.  If you are between 21 years and 21 years old, ask your health care provider if you should take aspirin to prevent strokes.  Have regular diabetes screenings. This involves taking a blood sample to check your fasting blood sugar level. ? If you are at a normal weight and have a low risk for diabetes,  have this test once every three years after 66 years of age. ? If you are overweight and have a high risk for diabetes, consider being tested at a younger age or more often. Preventing infection Hepatitis B  If you have a higher risk for hepatitis B, you should be screened for this virus. You are considered at high risk for hepatitis B if: ? You were born in a country where hepatitis B is common. Ask your health care provider which countries are considered high risk. ? Your parents were born in a high-risk country, and you have not been immunized against hepatitis B (hepatitis B vaccine). ? You have HIV or AIDS. ? You use needles to inject street drugs. ? You live with someone who has hepatitis B. ? You have had sex with someone who has hepatitis B. ? You get hemodialysis treatment. ? You take certain medicines for conditions, including cancer, organ transplantation, and autoimmune  conditions.  Hepatitis C  Blood testing is recommended for: ? Everyone born from [birth year] through 1964. ? Anyone with known risk factors for hepatitis C.  Sexually transmitted infections (STIs)  You should be screened for sexually transmitted infections (STIs) including gonorrhea and chlamydia if: ? You are sexually active and are younger than 66 years of age. ? You are older than 66 years of age and your health care provider tells you that you are at risk for this type of infection. ? Your sexual activity has changed since you were last screened and you are at an increased risk for chlamydia or gonorrhea. Ask your health care provider if you are at risk.  If you do not have HIV, but are at risk, it may be recommended that you take a prescription medicine daily to prevent HIV infection. This is called pre-exposure prophylaxis (PrEP). You are considered at risk if: ? You are sexually active and do not regularly use condoms or know the HIV status of your partner(s). ? You take drugs by injection. ? You are sexually active with a partner who has HIV.  Talk with your health care provider about whether you are at high risk of being infected with HIV. If you choose to begin PrEP, you should first be tested for HIV. You should then be tested every 3 months for as long as you are taking PrEP. Pregnancy  If you are premenopausal and you may become pregnant, ask your health care provider about preconception counseling.  If you may become pregnant, take 400 to 800 micrograms (mcg) of folic acid every day.  If you want to prevent pregnancy, talk to your health care provider about birth control (contraception). Osteoporosis and menopause  Osteoporosis is a disease in which the bones lose minerals and strength with aging. This can result in serious bone fractures. Your risk for osteoporosis can be identified using a bone density scan.  If you are 75 years of age or older, or if you are at risk for  osteoporosis and fractures, ask your health care provider if you should be screened.  Ask your health care provider whether you should take a calcium or vitamin D supplement to lower your risk for osteoporosis.  Menopause may have certain physical symptoms and risks.  Hormone replacement therapy may reduce some of these symptoms and risks. Talk to your health care provider about whether hormone replacement therapy is right for you. Follow these instructions at home:  Schedule regular health, dental, and eye exams.  Stay current with your immunizations.  Do not use any tobacco products including cigarettes, chewing tobacco, or electronic cigarettes.  If you are pregnant, do not drink alcohol.  If you are breastfeeding, limit how much and how often you drink alcohol.  Limit alcohol intake to no more than 1 drink per day for nonpregnant women. One drink equals 12 ounces of beer, 5 ounces of wine, or 1 ounces of hard liquor.  Do not use street drugs.  Do not share needles.  Ask your health care provider for help if you need support or information about quitting drugs.  Tell your health care provider if you often feel depressed.  Tell your health care provider if you have ever been abused or do not feel safe at home. This information is not intended to replace advice given to you by your health care provider. Make sure you discuss any questions you have with your health care provider. Document Released: 05/01/2011 Document Revised: 03/23/2016 Document Reviewed: 07/20/2015 Elsevier Interactive Patient Education  2018 [place]. Frida Wahlstrom M.D.

## 2018-05-17 ENCOUNTER — Encounter: Payer: Self-pay | Admitting: Internal Medicine

## 2018-05-17 ENCOUNTER — Ambulatory Visit (INDEPENDENT_AMBULATORY_CARE_PROVIDER_SITE_OTHER): Payer: Medicare Other | Admitting: Internal Medicine

## 2018-05-17 VITALS — BP 142/68 | HR 54 | Temp 98.1°F | Ht 64.5 in | Wt 109.0 lb

## 2018-05-17 DIAGNOSIS — E7800 Pure hypercholesterolemia, unspecified: Secondary | ICD-10-CM

## 2018-05-17 DIAGNOSIS — I480 Paroxysmal atrial fibrillation: Secondary | ICD-10-CM | POA: Diagnosis not present

## 2018-05-17 DIAGNOSIS — Z0000 Encounter for general adult medical examination without abnormal findings: Secondary | ICD-10-CM | POA: Diagnosis not present

## 2018-05-17 DIAGNOSIS — M810 Age-related osteoporosis without current pathological fracture: Secondary | ICD-10-CM

## 2018-05-17 DIAGNOSIS — Z79899 Other long term (current) drug therapy: Secondary | ICD-10-CM

## 2018-05-17 DIAGNOSIS — E559 Vitamin D deficiency, unspecified: Secondary | ICD-10-CM

## 2018-05-17 LAB — BASIC METABOLIC PANEL
BUN: 16 mg/dL (ref 6–23)
CHLORIDE: 104 meq/L (ref 96–112)
CO2: 32 meq/L (ref 19–32)
Calcium: 9.6 mg/dL (ref 8.4–10.5)
Creatinine, Ser: 0.73 mg/dL (ref 0.40–1.20)
GFR: 84.89 mL/min (ref >60.00)
GLUCOSE: 93 mg/dL (ref 70–99)
Potassium: 4.3 mEq/L (ref 3.5–5.1)
SODIUM: 142 meq/L (ref 135–145)

## 2018-05-17 LAB — CBC WITH DIFFERENTIAL/PLATELET
Basophils Absolute: 0 10*3/uL (ref 0.0–0.1)
Basophils Relative: 0.4 % (ref 0.0–3.0)
Eosinophils Absolute: 0.1 10*3/uL (ref 0.0–0.7)
Eosinophils Relative: 1 % (ref 0.0–5.0)
HEMATOCRIT: 44.8 % (ref 36.0–46.0)
Hemoglobin: 15.3 g/dL — ABNORMAL HIGH (ref 12.0–15.0)
LYMPHS ABS: 1.6 10*3/uL (ref 0.7–4.0)
LYMPHS PCT: 28.3 % (ref 12.0–46.0)
MCHC: 34.1 g/dL (ref 30.0–36.0)
MCV: 98.8 fl (ref 78.0–100.0)
MONOS PCT: 10 % (ref 3.0–12.0)
Monocytes Absolute: 0.5 10*3/uL (ref 0.1–1.0)
NEUTROS ABS: 3.3 10*3/uL (ref 1.4–7.7)
Neutrophils Relative %: 60.3 % (ref 43.0–77.0)
Platelets: 216 10*3/uL (ref 150.0–400.0)
RBC: 4.54 Mil/uL (ref 3.87–5.11)
RDW: 12.7 % (ref 11.5–15.5)
WBC: 5.5 10*3/uL (ref 4.0–10.5)

## 2018-05-17 LAB — TSH: TSH: 0.8 u[IU]/mL (ref 0.35–4.50)

## 2018-05-17 LAB — LIPID PANEL
CHOLESTEROL: 226 mg/dL — AB (ref 0–200)
HDL: 79.3 mg/dL (ref >39.00)
LDL CALC: 129 mg/dL — AB (ref 0–99)
NonHDL: 146.29
Total CHOL/HDL Ratio: 3
Triglycerides: 84 mg/dL (ref 0.0–149.0)
VLDL: 16.8 mg/dL (ref 0.0–40.0)

## 2018-05-17 LAB — HEPATIC FUNCTION PANEL
ALT: 23 U/L (ref 0–35)
AST: 21 U/L (ref 0–37)
Albumin: 4.5 g/dL (ref 3.5–5.2)
Alkaline Phosphatase: 37 U/L — ABNORMAL LOW (ref 39–117)
BILIRUBIN TOTAL: 0.7 mg/dL (ref 0.2–1.2)
Bilirubin, Direct: 0.1 mg/dL (ref 0.0–0.3)
TOTAL PROTEIN: 7.4 g/dL (ref 6.0–8.3)

## 2018-05-17 NOTE — Patient Instructions (Signed)
Swelling  Seems to be from gravity and poss VVeins  Consider comrpession stockings in day ( knee socks)  Exercise   Fu if progressive.   Will notify you  of labs when available.   Pneumovax 23 next may or after    Health Maintenance, Female Adopting a healthy lifestyle and getting preventive care can go a long way to promote health and wellness. Talk with your health care provider about what schedule of regular examinations is right for you. This is a good chance for you to check in with your provider about disease prevention and staying healthy. In between checkups, there are plenty of things you can do on your own. Experts have done a lot of research about which lifestyle changes and preventive measures are most likely to keep you healthy. Ask your health care provider for more information. Weight and diet Eat a healthy diet  Be sure to include plenty of vegetables, fruits, low-fat dairy products, and lean protein.  Do not eat a lot of foods high in solid fats, added sugars, or salt.  Get regular exercise. This is one of the most important things you can do for your health. ? Most adults should exercise for at least 150 minutes each week. The exercise should increase your heart rate and make you sweat (moderate-intensity exercise). ? Most adults should also do strengthening exercises at least twice a week. This is in addition to the moderate-intensity exercise.  Maintain a healthy weight  Body mass index (BMI) is a measurement that can be used to identify possible weight problems. It estimates body fat based on height and weight. Your health care provider can help determine your BMI and help you achieve or maintain a healthy weight.  For females 50 years of age and older: ? A BMI below 18.5 is considered underweight. ? A BMI of 18.5 to 24.9 is normal. ? A BMI of 25 to 29.9 is considered overweight. ? A BMI of 30 and above is considered obese.  Watch levels of cholesterol and blood  lipids  You should start having your blood tested for lipids and cholesterol at 65 years of age, then have this test every 5 years.  You may need to have your cholesterol levels checked more often if: ? Your lipid or cholesterol levels are high. ? You are older than 66 years of age. ? You are at high risk for heart disease.  Cancer screening Lung Cancer  Lung cancer screening is recommended for adults 34-37 years old who are at high risk for lung cancer because of a history of smoking.  A yearly low-dose CT scan of the lungs is recommended for people who: ? Currently smoke. ? Have quit within the past 15 years. ? Have at least a 30-pack-year history of smoking. A pack year is smoking an average of one pack of cigarettes a day for 1 year.  Yearly screening should continue until it has been 15 years since you quit.  Yearly screening should stop if you develop a health problem that would prevent you from having lung cancer treatment.  Breast Cancer  Practice breast self-awareness. This means understanding how your breasts normally appear and feel.  It also means doing regular breast self-exams. Let your health care provider know about any changes, no matter how small.  If you are in your 20s or 30s, you should have a clinical breast exam (CBE) by a health care provider every 1-3 years as part of a regular health exam.  If you are 40 or older, have a CBE every year. Also consider having a breast X-ray (mammogram) every year.  If you have a family history of breast cancer, talk to your health care provider about genetic screening.  If you are at high risk for breast cancer, talk to your health care provider about having an MRI and a mammogram every year.  Breast cancer gene (BRCA) assessment is recommended for women who have family members with BRCA-related cancers. BRCA-related cancers include: ? Breast. ? Ovarian. ? Tubal. ? Peritoneal cancers.  Results of the assessment will  determine the need for genetic counseling and BRCA1 and BRCA2 testing.  Cervical Cancer Your health care provider may recommend that you be screened regularly for cancer of the pelvic organs (ovaries, uterus, and vagina). This screening involves a pelvic examination, including checking for microscopic changes to the surface of your cervix (Pap test). You may be encouraged to have this screening done every 3 years, beginning at age 78.  For women ages 39-65, health care providers may recommend pelvic exams and Pap testing every 3 years, or they may recommend the Pap and pelvic exam, combined with testing for human papilloma virus (HPV), every 5 years. Some types of HPV increase your risk of cervical cancer. Testing for HPV may also be done on women of any age with unclear Pap test results.  Other health care providers may not recommend any screening for nonpregnant women who are considered low risk for pelvic cancer and who do not have symptoms. Ask your health care provider if a screening pelvic exam is right for you.  If you have had past treatment for cervical cancer or a condition that could lead to cancer, you need Pap tests and screening for cancer for at least 20 years after your treatment. If Pap tests have been discontinued, your risk factors (such as having a new sexual partner) need to be reassessed to determine if screening should resume. Some women have medical problems that increase the chance of getting cervical cancer. In these cases, your health care provider may recommend more frequent screening and Pap tests.  Colorectal Cancer  This type of cancer can be detected and often prevented.  Routine colorectal cancer screening usually begins at 66 years of age and continues through 66 years of age.  Your health care provider may recommend screening at an earlier age if you have risk factors for colon cancer.  Your health care provider may also recommend using home test kits to check  for hidden blood in the stool.  A small camera at the end of a tube can be used to examine your colon directly (sigmoidoscopy or colonoscopy). This is done to check for the earliest forms of colorectal cancer.  Routine screening usually begins at age 71.  Direct examination of the colon should be repeated every 5-10 years through 66 years of age. However, you may need to be screened more often if early forms of precancerous polyps or small growths are found.  Skin Cancer  Check your skin from head to toe regularly.  Tell your health care provider about any new moles or changes in moles, especially if there is a change in a mole's shape or color.  Also tell your health care provider if you have a mole that is larger than the size of a pencil eraser.  Always use sunscreen. Apply sunscreen liberally and repeatedly throughout the day.  Protect yourself by wearing long sleeves, pants, a wide-brimmed hat, and  sunglasses whenever you are outside.  Heart disease, diabetes, and high blood pressure  High blood pressure causes heart disease and increases the risk of stroke. High blood pressure is more likely to develop in: ? People who have blood pressure in the high end of the normal range (130-139/85-89 mm Hg). ? People who are overweight or obese. ? People who are African American.  If you are 22-80 years of age, have your blood pressure checked every 3-5 years. If you are 14 years of age or older, have your blood pressure checked every year. You should have your blood pressure measured twice-once when you are at a hospital or clinic, and once when you are not at a hospital or clinic. Record the average of the two measurements. To check your blood pressure when you are not at a hospital or clinic, you can use: ? An automated blood pressure machine at a pharmacy. ? A home blood pressure monitor.  If you are between 19 years and 5 years old, ask your health care provider if you should take  aspirin to prevent strokes.  Have regular diabetes screenings. This involves taking a blood sample to check your fasting blood sugar level. ? If you are at a normal weight and have a low risk for diabetes, have this test once every three years after 66 years of age. ? If you are overweight and have a high risk for diabetes, consider being tested at a younger age or more often. Preventing infection Hepatitis B  If you have a higher risk for hepatitis B, you should be screened for this virus. You are considered at high risk for hepatitis B if: ? You were born in a country where hepatitis B is common. Ask your health care provider which countries are considered high risk. ? Your parents were born in a high-risk country, and you have not been immunized against hepatitis B (hepatitis B vaccine). ? You have HIV or AIDS. ? You use needles to inject street drugs. ? You live with someone who has hepatitis B. ? You have had sex with someone who has hepatitis B. ? You get hemodialysis treatment. ? You take certain medicines for conditions, including cancer, organ transplantation, and autoimmune conditions.  Hepatitis C  Blood testing is recommended for: ? Everyone born from [birth year] through 1964. ? Anyone with known risk factors for hepatitis C.  Sexually transmitted infections (STIs)  You should be screened for sexually transmitted infections (STIs) including gonorrhea and chlamydia if: ? You are sexually active and are younger than 66 years of age. ? You are older than 66 years of age and your health care provider tells you that you are at risk for this type of infection. ? Your sexual activity has changed since you were last screened and you are at an increased risk for chlamydia or gonorrhea. Ask your health care provider if you are at risk.  If you do not have HIV, but are at risk, it may be recommended that you take a prescription medicine daily to prevent HIV infection. This is called  pre-exposure prophylaxis (PrEP). You are considered at risk if: ? You are sexually active and do not regularly use condoms or know the HIV status of your partner(s). ? You take drugs by injection. ? You are sexually active with a partner who has HIV.  Talk with your health care provider about whether you are at high risk of being infected with HIV. If you choose to begin PrEP, you  should first be tested for HIV. You should then be tested every 3 months for as long as you are taking PrEP. Pregnancy  If you are premenopausal and you may become pregnant, ask your health care provider about preconception counseling.  If you may become pregnant, take 400 to 800 micrograms (mcg) of folic acid every day.  If you want to prevent pregnancy, talk to your health care provider about birth control (contraception). Osteoporosis and menopause  Osteoporosis is a disease in which the bones lose minerals and strength with aging. This can result in serious bone fractures. Your risk for osteoporosis can be identified using a bone density scan.  If you are 7 years of age or older, or if you are at risk for osteoporosis and fractures, ask your health care provider if you should be screened.  Ask your health care provider whether you should take a calcium or vitamin D supplement to lower your risk for osteoporosis.  Menopause may have certain physical symptoms and risks.  Hormone replacement therapy may reduce some of these symptoms and risks. Talk to your health care provider about whether hormone replacement therapy is right for you. Follow these instructions at home:  Schedule regular health, dental, and eye exams.  Stay current with your immunizations.  Do not use any tobacco products including cigarettes, chewing tobacco, or electronic cigarettes.  If you are pregnant, do not drink alcohol.  If you are breastfeeding, limit how much and how often you drink alcohol.  Limit alcohol intake to no more  than 1 drink per day for nonpregnant women. One drink equals 12 ounces of beer, 5 ounces of wine, or 1 ounces of hard liquor.  Do not use street drugs.  Do not share needles.  Ask your health care provider for help if you need support or information about quitting drugs.  Tell your health care provider if you often feel depressed.  Tell your health care provider if you have ever been abused or do not feel safe at home. This information is not intended to replace advice given to you by your health care provider. Make sure you discuss any questions you have with your health care provider. Document Released: 05/01/2011 Document Revised: 03/23/2016 Document Reviewed: 07/20/2015 Elsevier Interactive Patient Education  [employer].

## 2018-05-23 DIAGNOSIS — M6281 Muscle weakness (generalized): Secondary | ICD-10-CM | POA: Diagnosis not present

## 2018-05-23 DIAGNOSIS — M6289 Other specified disorders of muscle: Secondary | ICD-10-CM | POA: Diagnosis not present

## 2018-05-23 DIAGNOSIS — M62838 Other muscle spasm: Secondary | ICD-10-CM | POA: Diagnosis not present

## 2018-05-27 ENCOUNTER — Other Ambulatory Visit: Payer: Self-pay | Admitting: Cardiology

## 2018-06-10 DIAGNOSIS — M6281 Muscle weakness (generalized): Secondary | ICD-10-CM | POA: Diagnosis not present

## 2018-06-10 DIAGNOSIS — M62838 Other muscle spasm: Secondary | ICD-10-CM | POA: Diagnosis not present

## 2018-06-10 DIAGNOSIS — M6289 Other specified disorders of muscle: Secondary | ICD-10-CM | POA: Diagnosis not present

## 2018-06-19 DIAGNOSIS — H01022 Squamous blepharitis right lower eyelid: Secondary | ICD-10-CM | POA: Diagnosis not present

## 2018-06-19 DIAGNOSIS — H35373 Puckering of macula, bilateral: Secondary | ICD-10-CM | POA: Diagnosis not present

## 2018-06-19 DIAGNOSIS — H01024 Squamous blepharitis left upper eyelid: Secondary | ICD-10-CM | POA: Diagnosis not present

## 2018-06-19 DIAGNOSIS — H353131 Nonexudative age-related macular degeneration, bilateral, early dry stage: Secondary | ICD-10-CM | POA: Diagnosis not present

## 2018-06-19 DIAGNOSIS — H01021 Squamous blepharitis right upper eyelid: Secondary | ICD-10-CM | POA: Diagnosis not present

## 2018-06-21 DIAGNOSIS — H2511 Age-related nuclear cataract, right eye: Secondary | ICD-10-CM | POA: Diagnosis not present

## 2018-06-21 DIAGNOSIS — H353132 Nonexudative age-related macular degeneration, bilateral, intermediate dry stage: Secondary | ICD-10-CM | POA: Diagnosis not present

## 2018-06-21 DIAGNOSIS — H35372 Puckering of macula, left eye: Secondary | ICD-10-CM | POA: Diagnosis not present

## 2018-06-21 DIAGNOSIS — H35371 Puckering of macula, right eye: Secondary | ICD-10-CM | POA: Diagnosis not present

## 2018-07-02 DIAGNOSIS — M6289 Other specified disorders of muscle: Secondary | ICD-10-CM | POA: Diagnosis not present

## 2018-07-02 DIAGNOSIS — M6281 Muscle weakness (generalized): Secondary | ICD-10-CM | POA: Diagnosis not present

## 2018-07-02 DIAGNOSIS — M62838 Other muscle spasm: Secondary | ICD-10-CM | POA: Diagnosis not present

## 2018-07-10 DIAGNOSIS — Z1231 Encounter for screening mammogram for malignant neoplasm of breast: Secondary | ICD-10-CM | POA: Diagnosis not present

## 2018-07-10 LAB — HM MAMMOGRAPHY

## 2018-07-30 DIAGNOSIS — M6281 Muscle weakness (generalized): Secondary | ICD-10-CM | POA: Diagnosis not present

## 2018-07-30 DIAGNOSIS — M6289 Other specified disorders of muscle: Secondary | ICD-10-CM | POA: Diagnosis not present

## 2018-07-30 DIAGNOSIS — M62838 Other muscle spasm: Secondary | ICD-10-CM | POA: Diagnosis not present

## 2018-08-06 ENCOUNTER — Ambulatory Visit (HOSPITAL_COMMUNITY): Payer: Medicare Other | Attending: Cardiology

## 2018-08-06 ENCOUNTER — Other Ambulatory Visit: Payer: Self-pay

## 2018-08-06 DIAGNOSIS — I351 Nonrheumatic aortic (valve) insufficiency: Secondary | ICD-10-CM | POA: Diagnosis not present

## 2018-08-14 DIAGNOSIS — H35361 Drusen (degenerative) of macula, right eye: Secondary | ICD-10-CM | POA: Diagnosis not present

## 2018-08-14 DIAGNOSIS — H353132 Nonexudative age-related macular degeneration, bilateral, intermediate dry stage: Secondary | ICD-10-CM | POA: Diagnosis not present

## 2018-08-14 DIAGNOSIS — H35372 Puckering of macula, left eye: Secondary | ICD-10-CM | POA: Diagnosis not present

## 2018-08-14 DIAGNOSIS — H35371 Puckering of macula, right eye: Secondary | ICD-10-CM | POA: Diagnosis not present

## 2018-09-02 DIAGNOSIS — M62838 Other muscle spasm: Secondary | ICD-10-CM | POA: Diagnosis not present

## 2018-09-02 DIAGNOSIS — M6289 Other specified disorders of muscle: Secondary | ICD-10-CM | POA: Diagnosis not present

## 2018-09-02 DIAGNOSIS — M6281 Muscle weakness (generalized): Secondary | ICD-10-CM | POA: Diagnosis not present

## 2018-09-03 ENCOUNTER — Ambulatory Visit: Payer: Medicare Other | Admitting: Cardiology

## 2018-09-11 ENCOUNTER — Other Ambulatory Visit: Payer: Self-pay | Admitting: Internal Medicine

## 2018-09-11 NOTE — Telephone Encounter (Signed)
[name] [name]

## 2018-09-13 ENCOUNTER — Telehealth: Payer: Self-pay | Admitting: Internal Medicine

## 2018-09-13 NOTE — Telephone Encounter (Signed)
Patient has called in regards to bone density test that was discussed at last appointment. Please Advise, thanks

## 2018-09-15 ENCOUNTER — Other Ambulatory Visit: Payer: Self-pay | Admitting: Cardiology

## 2018-09-15 NOTE — Progress Notes (Signed)
HPI The patient presents for follow up of palpitations and aortic insufficiency. She has paroxysmal atrial fibrillation. She has moderate AI.  I sent her for a POET(Plain Old Exercise Treadmill)  (in anticipation that she would need flecainide.)  and she had atrial fib during recovery.  She was cared for by Dr. Claiborne Billings and was given Cardizem.  She had return to [facility] later in the evening.   She does have an [employer] to track her HR .  She agreed to start flecainide and has been titrated to 75 mg bid but she did not want to increase to 100 mg despite the fact that she was still having some breakthrough palpitations.  She saw Dr. Rayann Heman and he discussed ablation and she is considering this.    Since I last saw her she is only had a couple of episodes of fibrillation.  One happened in June and another in September.  This is much less frequently than it was.  She is walking and doing Zumba.  She is not able to bring on symptoms.  She otherwise feels well.  She denies any chest pressure, neck or arm discomfort.  Said no PND or orthopnea.  She is had a little bit of lower extremity swelling with some mild venous stasis changes.    Allergies  Allergen Reactions  . Codeine Phosphate     drowsy  . Demerol [Meperidine] Nausea Only  . Meperidine Hcl Nausea Only  . Sulfa Antibiotics     Rash    Current Outpatient Medications  Medication Sig Dispense Refill  . acetaminophen (TYLENOL) 500 MG tablet Take 500 mg by mouth as needed.    [name] alendronate (FOSAMAX) 70 MG tablet TAKE 1 TABLET ONCE A WEEK  WITH A FULL GLASS OF WATER  ON AN EMPTY STOMACH 12 tablet 3  . Calcium Carbonate-Vitamin D (CALCIUM PLUS VITAMIN D PO) Take 1 tablet by mouth daily.     . Cholecalciferol (VITAMIN D3) 1000 units CAPS Take 2 capsules by mouth daily.     [name] diltiazem (CARDIZEM CD) 300 MG 24 hr capsule TAKE 1 CAPSULE BY MOUTH  DAILY 90 capsule 1  . ELIQUIS 5 MG TABS tablet TAKE 1 TABLET BY MOUTH TWO  TIMES DAILY 180 tablet 1  .  flecainide (TAMBOCOR) 50 MG tablet TAKE 1 AND 1/2 TABLETS BY  MOUTH TWO TIMES DAILY (DOSE INCREASE) 270 tablet 2  . ibuprofen (ADVIL,MOTRIN) 800 MG tablet Take 800 mg by mouth 2 (two) times daily as needed for moderate pain.     [name] LORazepam (ATIVAN) 0.5 MG tablet Take 1 tablet (0.5 mg total) by mouth 3 (three) times daily as needed for anxiety. For travel 20 tablet 0  . metoprolol tartrate (LOPRESSOR) 25 MG tablet TAKE 1 TABLET BY MOUTH TWO  TIMES DAILY 180 tablet 1  . montelukast (SINGULAIR) 10 MG tablet Take 1 tablet (10 mg total) by mouth daily. 90 tablet 3  . Multiple Vitamins-Minerals (PRESERVISION AREDS PO) Take 1 tablet by mouth 2 (two) times daily.      No current facility-administered medications for this visit.     Past Medical History:  Diagnosis Date  . Allergic rhinitis   . Aortic insufficiency   . CAT SCRATCH 09/10/2009   Qualifier: Diagnosis of  By: Regis Bill MD, [name]   . Clavicle fracture 3/08   left  . Diverticulosis   . HTN (hypertension)   . Osteopenia   . Paroxysmal atrial fibrillation (HCC)   .  Vision disturbance 03/13/2013   Negative neuro MRA MRI    Past Surgical History:  Procedure Laterality Date  . ABDOMINAL HYSTERECTOMY     pt denies 04/15/14  . APPENDECTOMY    . LAPAROSCOPIC SALPINGO OOPHERECTOMY Right   . TONSILLECTOMY AND ADENOIDECTOMY      ROS:  As stated in the HPI and negative for all other systems.  PHYSICAL EXAM BP (!) 144/78   Pulse 60   Ht 5\' 5"  (1.651 m)   Wt 111 lb 3.2 oz (50.4 kg)   BMI 18.50 kg/m   GENERAL:  Well appearing NECK:  No jugular venous distention, waveform within normal limits, carotid upstroke brisk and symmetric, no bruits, no thyromegaly LUNGS:  Clear to auscultation bilaterally CHEST:  Unremarkable HEART:  PMI not displaced or sustained,S1 and S2 within normal limits, no S3, no S4, no clicks, no rubs, 3 out of 6 diastolic murmur, no systolic murmurs ABD:  Flat, positive bowel sounds normal in frequency in pitch,  no bruits, no rebound, no guarding, no midline pulsatile mass, no hepatomegaly, no splenomegaly EXT:  2 plus pulses throughout, trace edema, no cyanosis no clubbing    EKG:    Sinus rhythm, rate 60, axis within normal limits, intervals within normal limits, no acute ST-T wave changes.   ASSESSMENT AND PLAN  Aortic insufficiency -  This was still moderate on echo in October.  No change in therapy.    Atrial fib-  [name] has a CHA2DS2 - VASc score of 2.    She wants to continue current therapy.   HTN - The blood pressure is mildly elevated today but this is unusual.  She will keep an eye on it at home.  No change in therapy.

## 2018-09-16 ENCOUNTER — Other Ambulatory Visit: Payer: Self-pay | Admitting: Internal Medicine

## 2018-09-16 DIAGNOSIS — M810 Age-related osteoporosis without current pathological fracture: Secondary | ICD-10-CM

## 2018-09-16 NOTE — Telephone Encounter (Signed)
Spoke to patient, she last had a DEXA 06/2016.  Does patient need another one before you see her 11/25?

## 2018-09-16 NOTE — Telephone Encounter (Signed)
Yes, I ordered it.  Can you please call [facility] and try to schedule it for her? I am not sure if this will be done by our next appointment, but that is ok.

## 2018-09-17 ENCOUNTER — Ambulatory Visit: Payer: Medicare Other | Admitting: Cardiology

## 2018-09-17 ENCOUNTER — Encounter: Payer: Self-pay | Admitting: Cardiology

## 2018-09-17 VITALS — BP 144/78 | HR 60 | Ht 65.0 in | Wt 111.2 lb

## 2018-09-17 DIAGNOSIS — I351 Nonrheumatic aortic (valve) insufficiency: Secondary | ICD-10-CM

## 2018-09-17 NOTE — Patient Instructions (Signed)
Medication Instructions:  Continue current medications  If you need a refill on your cardiac medications before your next appointment, please call your pharmacy.  Labwork: None Ordered   If you have labs (blood work) drawn today and your tests are completely normal, you will receive your results only by: . MyChart Message (if you have MyChart) OR . A paper copy in the mail If you have any lab test that is abnormal or we need to change your treatment, we will call you to review the results.  Testing/Procedures: None Ordered  Follow-Up: You will need a follow up appointment in 6 Months.  Please call our office 2 months in advance([phone]) to schedule the (6 Months) appointment.  You may see  DR Hochrein, or one of the following Advanced Practice Providers on your designated Care Team:    . Kathryn Lawrence, DNP, ANP . Rhonda Barrett, PA-C  . Luke Kilroy, PA-C  . Hao Meng, PA-C . Angela Duke, PA-C  At [facility], you and your health needs are our priority.  As part of our continuing mission to provide you with exceptional heart care, we have created designated Provider Care Teams.  These Care Teams include your primary Cardiologist (physician) and Advanced Practice Providers (APPs -  Physician Assistants and Nurse Practitioners) who all work together to provide you with the care you need, when you need it.   Thank you for choosing [facility] at [facility]!!       

## 2018-09-23 ENCOUNTER — Ambulatory Visit (INDEPENDENT_AMBULATORY_CARE_PROVIDER_SITE_OTHER)
Admission: RE | Admit: 2018-09-23 | Discharge: 2018-09-23 | Disposition: A | Discharge status: Home / Self Care | Payer: Medicare Other | Source: Ambulatory Visit | Attending: Internal Medicine | Admitting: Internal Medicine

## 2018-09-23 ENCOUNTER — Ambulatory Visit: Payer: Medicare Other | Admitting: Internal Medicine

## 2018-09-23 ENCOUNTER — Encounter: Payer: Self-pay | Admitting: Internal Medicine

## 2018-09-23 VITALS — BP 128/60 | HR 58 | Ht 65.0 in | Wt 110.0 lb

## 2018-09-23 DIAGNOSIS — M810 Age-related osteoporosis without current pathological fracture: Secondary | ICD-10-CM

## 2018-09-23 DIAGNOSIS — E559 Vitamin D deficiency, unspecified: Secondary | ICD-10-CM | POA: Diagnosis not present

## 2018-09-23 LAB — VITAMIN D 25 HYDROXY (VIT D DEFICIENCY, FRACTURES): VITD: 43.25 ng/mL (ref 30.00–100.00)

## 2018-09-23 NOTE — Patient Instructions (Signed)
Please continue the same doses of calcium and vitamin D.  Please stop at the lab.  Please come back for a follow-up appointment in 1 year.

## 2018-09-23 NOTE — Progress Notes (Addendum)
Patient ID: [name] [name], female   DOB: 26-Nov-1950, 67 y.o.   MRN: [id number]    HPI  [name] [name] is a 67 y.o.-year-old female, returning for follow-up for osteoporosis.  Last visit 1 year ago. She now has Medicare.  She was diagnosed with osteoporosis in 2017.  Reviewed patient's DXA scans:  Date L1-L4 T score FN T score FRAX   07/27/2016  -1.8 (-3.4%*) RFN: -2.6 (-10.2%*) LFN: -2.4   09/30/2010  -1.6 RFN: -2.0 LFN: -1.8 10 year MOF: 11.9%  10 year hip fracture: 2.2%    She had the following fractures: 1. collarbone - ~2010 - fell in a parking lot 2. L foot fx and sprained ankle - few years ago - tripped and fell 3. R foot fx and sprained ankle - 10/2015 No falls or fractures since last visit.  No dizziness/vertigo/orthostasis/poor vision.  Previous osteoporosis treatments: - Fosamax for 5 years  - up to 2011 - started Fosamax 70 mg weekly -since 12/2016  She does have a history of vitamin D deficiency, but last visits level was normal: Lab Results  Component Value Date   VD25OH 40.16 10/19/2017   VD25OH 31.41 12/20/2016   VD25OH 28.88 (L) 09/14/2016   She continues on calcium and vitamin D: 600 mg calcium and 2400 mg vitamin D.  She continues to walk in the neighborhood (during the summer) and she is doing aerobic classes/strength 1-2 times a week (during the winter).  She is not taking high vitamin A doses.  Menopause was in late 58s.  She was on HRT.  Pt does have a FH of osteoporosis in mother - kyphosis.  No fractures.  No history of kidney stones.  No history of hyper/hypocalcemia or hyperparathyroidism: Lab Results  Component Value Date   PTH 59 09/14/2016   CALCIUM 9.6 05/17/2018   CALCIUM 9.4 10/19/2017   CALCIUM 9.6 09/14/2016   CALCIUM 9.5 10/15/2014   CALCIUM 9.5 04/14/2014   CALCIUM 9.4 04/13/2014   CALCIUM 9.4 03/13/2013   CALCIUM 9.4 12/08/2011   CALCIUM 9.4 09/03/2009   CALCIUM 9.8 03/05/2007   No history of thyrotoxicosis.  Reviewed latest  TSH, which was normal: Lab Results  Component Value Date   TSH 0.80 05/17/2018   TSH 1.22 09/14/2016   TSH 1.065 08/25/2015   TSH 1.04 10/15/2014   TSH 0.53 12/08/2011   No history of CKD. Last BUN/Cr: Lab Results  Component Value Date   BUN 16 05/17/2018   CREATININE 0.73 05/17/2018   For Afib >> she started Flecainide and Metoprolol and continues Eliquis. She is going to the Afib clinic.  She increased her Flecainide.  She goes to [facility] >> pelvic training.   ROS: Constitutional: no weight gain/no weight loss, no fatigue, no subjective hyperthermia, no subjective hypothermia Eyes: no blurry vision, no xerophthalmia ENT: no sore throat, no nodules palpated in neck, no dysphagia, no odynophagia, no hoarseness Cardiovascular: no CP/no SOB/no palpitations/no leg swelling Respiratory: no cough/no SOB/no wheezing Gastrointestinal: no N/no V/no D/no C/no acid reflux Musculoskeletal: no muscle aches/no joint aches Skin: no rashes, no hair loss Neurological: no tremors/no numbness/no tingling/no dizziness  I reviewed pt's medications, allergies, PMH, social hx, family hx, and changes were documented in the history of present illness. Otherwise, unchanged from my initial visit note.  Past Medical History:  Diagnosis Date  . Allergic rhinitis   . Aortic insufficiency   . CAT SCRATCH 09/10/2009   Qualifier: Diagnosis of  By: Regis Bill MD, [name]   .  Clavicle fracture 3/08   left  . Diverticulosis   . HTN (hypertension)   . Osteopenia   . Paroxysmal atrial fibrillation (HCC)   . Vision disturbance 03/13/2013   Negative neuro MRA MRI   Past Surgical History:  Procedure Laterality Date  . ABDOMINAL HYSTERECTOMY     pt denies 04/15/14  . APPENDECTOMY    . LAPAROSCOPIC SALPINGO OOPHERECTOMY Right   . TONSILLECTOMY AND ADENOIDECTOMY     Social History   Social History  . Marital status: Married    Spouse name: N/A  . Number of children: 0 - had infertility tx's in  her 28s   Occupational History  . 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 Smoking status: Never Smoker  . Smokeless tobacco: Never Used  . Alcohol use No     Comment: never  . Drug use: No   Social History Narrative   Married   hh of 2    p0g0   Works for [employer] and record    Current Outpatient Medications on File Prior to Visit  Medication Sig Dispense Refill  . acetaminophen (TYLENOL) 500 MG tablet Take 500 mg by mouth as needed.    [name] alendronate (FOSAMAX) 70 MG tablet TAKE 1 TABLET ONCE A WEEK  WITH A FULL GLASS OF WATER  ON AN EMPTY STOMACH 12 tablet 3  . Calcium Carbonate-Vitamin D (CALCIUM PLUS VITAMIN D PO) Take 1 tablet by mouth daily.     . Cholecalciferol (VITAMIN D3) 1000 units CAPS Take 2 capsules by mouth daily.     [name] diltiazem (CARDIZEM CD) 300 MG 24 hr capsule TAKE 1 CAPSULE BY MOUTH  DAILY 90 capsule 1  . ELIQUIS 5 MG TABS tablet TAKE 1 TABLET BY MOUTH TWO  TIMES DAILY 180 tablet 1  . flecainide (TAMBOCOR) 50 MG tablet TAKE 1 AND 1/2 TABLETS BY  MOUTH TWO TIMES DAILY (DOSE INCREASE) 270 tablet 2  . ibuprofen (ADVIL,MOTRIN) 800 MG tablet Take 800 mg by mouth 2 (two) times daily as needed for moderate pain.     [name] LORazepam (ATIVAN) 0.5 MG tablet Take 1 tablet (0.5 mg total) by mouth 3 (three) times daily as needed for anxiety. For travel 20 tablet 0  . metoprolol tartrate (LOPRESSOR) 25 MG tablet TAKE 1 TABLET BY MOUTH TWO  TIMES DAILY 180 tablet 1  . montelukast (SINGULAIR) 10 MG tablet Take 1 tablet (10 mg total) by mouth daily. 90 tablet 3  . Multiple Vitamins-Minerals (PRESERVISION AREDS PO) Take 1 tablet by mouth 2 (two) times daily.      No current facility-administered medications on file prior to visit.    Allergies  Allergen Reactions  . Codeine Phosphate     drowsy  . Demerol [Meperidine] Nausea Only  . Meperidine Hcl Nausea Only  . Sulfa Antibiotics     Rash   Family History  Problem Relation Age of Onset  .  Scleroderma Mother   . Hyperlipidemia Mother   . Coronary artery disease Father 6  . Esophageal cancer Paternal Grandmother    PE: BP 128/60   Pulse (!) 58   Ht 5\' 5"  (1.651 m) Comment: measured  Wt 110 lb (49.9 kg)   SpO2 97%   BMI 18.30 kg/m  Wt Readings from Last 3 Encounters:  09/23/18 110 lb (49.9 kg)  09/17/18 111 lb 3.2 oz (50.4 kg)  05/17/18 109 lb (49.4 kg)   Constitutional: Thin, no kyphosis,  in NAD Eyes: PERRLA, EOMI, no exophthalmos ENT: moist mucous membranes, no thyromegaly, no cervical lymphadenopathy Cardiovascular: RRR, No MRG Respiratory: CTA B Gastrointestinal: abdomen soft, NT, ND, BS+ Musculoskeletal: no deformities, strength intact in all 4 Skin: moist, warm, no rashes Neurological: no tremor with outstretched hands, DTR normal in all 4  Assessment: 1. Osteoporosis  2. Vit D insufficiency  Plan: 1. Osteoporosis -Likely postmenopausal/age-related and she also has family history of osteoporosis -she has a high risk for fracture since her T-scores are lower than -2.5 -She agreed to try Prolia in the past, however, this was too expensive so we had to start Fosamax 70 mg daily in 12/2016.  She has no side effects from this: No bone pain, no hip/thigh pain.  No dental work in progress. -Since last visit she contacted me to order a new DXA scan.  The results of this are still pending. -At this visit we will a vitamin D level. -Discussed about fall prevention -Recommended weightbearing exercises >> she is doing aerobics >> rec'd elastic bands -I will see her back in a year.  2. Vit D insufficiency -Continues on 2400 units vitamin D daily.  She also takes a calcium supplement: 2000 units (increased 11/2016) + Ca-vitD daily (400 units more/day) -At last visit, vitamin D level was 40, normal -We will recheck her level today  Office Visit on 09/23/2018  Component Date Value Ref Range Status  . VITD 09/23/2018 43.25  30.00 - 100.00 ng/mL Final  Vitamin D  level remains at goal.  Results: DXA (09/23/2018) Lumbar spine L1-L4 Femoral neck (FN) 33% distal radius  T-score  -1.7 RFN: -2.7 LFN: -2.3 n/a  Change in BMD from previous DXA test (%) Up 1.4% Down 0.4% n/a  (*) statistically significant  Stable T scores.  Philemon Kingdom, MD PhD [facility] End[facility]

## 2018-09-25 ENCOUNTER — Other Ambulatory Visit: Payer: Self-pay

## 2018-09-30 DIAGNOSIS — M6281 Muscle weakness (generalized): Secondary | ICD-10-CM | POA: Diagnosis not present

## 2018-09-30 DIAGNOSIS — M6289 Other specified disorders of muscle: Secondary | ICD-10-CM | POA: Diagnosis not present

## 2018-09-30 DIAGNOSIS — M62838 Other muscle spasm: Secondary | ICD-10-CM | POA: Diagnosis not present

## 2018-10-05 ENCOUNTER — Other Ambulatory Visit: Payer: Self-pay | Admitting: Cardiology

## 2018-10-15 ENCOUNTER — Encounter: Payer: Self-pay | Admitting: Internal Medicine

## 2018-11-13 DIAGNOSIS — H35372 Puckering of macula, left eye: Secondary | ICD-10-CM | POA: Diagnosis not present

## 2018-11-13 DIAGNOSIS — H2511 Age-related nuclear cataract, right eye: Secondary | ICD-10-CM | POA: Diagnosis not present

## 2018-11-13 DIAGNOSIS — H35371 Puckering of macula, right eye: Secondary | ICD-10-CM | POA: Diagnosis not present

## 2018-11-13 DIAGNOSIS — H353132 Nonexudative age-related macular degeneration, bilateral, intermediate dry stage: Secondary | ICD-10-CM | POA: Diagnosis not present

## 2018-11-16 ENCOUNTER — Other Ambulatory Visit: Payer: Self-pay | Admitting: Cardiology

## 2019-01-12 ENCOUNTER — Other Ambulatory Visit: Payer: Self-pay | Admitting: Internal Medicine

## 2019-01-12 ENCOUNTER — Other Ambulatory Visit (HOSPITAL_COMMUNITY): Payer: Self-pay | Admitting: Nurse Practitioner

## 2019-01-13 NOTE — Telephone Encounter (Signed)
Needs appointment for further refills of Singulair or may refill with PCP per Dr. Janee Morn note at [facility] May 2019

## 2019-02-14 ENCOUNTER — Telehealth: Payer: Self-pay

## 2019-02-14 ENCOUNTER — Telehealth: Payer: Self-pay | Admitting: Cardiology

## 2019-02-14 NOTE — Telephone Encounter (Signed)
New message:    Patient is calling concerning left leg, she is feeling some numbness and in her left thigh. Please call patient.

## 2019-02-14 NOTE — Telephone Encounter (Signed)
Triage sent a note stating the patient is having trouble with intermittent thigh numbness. This numbness only occurs with sitting, not lying or walking. I reviewed her chart. I attempted to call the patient with no answer, left a voicemail. Given the documentation by Triage, I do not think this is a side effect from eliquis. I suggested that she touch base with her PCP, it could be a nerve problem. I will ask Triage to try to call again.

## 2019-02-14 NOTE — Telephone Encounter (Signed)
Attempted to contact patient again, LVM advising of PA recommendations to contact PCP.  Left call back number.

## 2019-02-14 NOTE — Telephone Encounter (Signed)
Called patient, she states that for the past 3-4 weeks, she has noticed some numbness in her left leg, above the knee, but below the groin. She states its mostly in her thigh area. She states it happens when she is sitting, but once she gets up and moves around it goes away, it also does not occur when she is laying in bed. She denies redness or skin changes, denies hardness in leg area, she also denies chest pains, SOB, swelling. Patient is only anxious due to the numbness due to her being on Eliquis.   Advised I would route a message for any recommendations at this time to covering PA's, as well as MD.   Patient was thankful for the callback.

## 2019-02-14 NOTE — Telephone Encounter (Signed)
Left message for pt to get appt regarding numbness in her leg for virtual visit

## 2019-02-17 NOTE — Progress Notes (Signed)
Virtual Visit via Video Note  I connected with@ on 02/18/19 at  8:30 AM EDT by a video enabled telemedicine application and verified that I am speaking with the correct person using two identifiers. Location patient: home Location provider:work or home office Persons participating in the virtual visit: patient, provider  [place] national recommendations  regarding COVID 19 pandemic   video visit is advised over in office visit for this patient.  Discussed the limitations of evaluation and management by telemedicine and  availability of in person appointments. The patient expressed understanding and agreed to proceed.   HPI: [name] [name]  Presents for video visit because of problem of intermittent numbness or tingling feeling in the left proximal leg thigh area. For the past month she has had off and on symptoms of a tingling numbness feeling in her proximal anterior thigh somewhat going to the medial area and lateral at times.  This is not associated with pain weakness or dysfunction She notices more often after she has been sitting after 5 minutes or so she does wear tights but no other finding close.  Denies any lumps or masses. Was concerned and wanted to make sure it was not related to her heart condition. No bowel or bladder symptoms obvious she feels pretty well is able to exercise.  She does not seem to get this when she is walking around.    Hx llateral thight sx in 10 2018 note that was in a slightly different area down to her knee and that resolved over time. ROS: See pertinent positives and negatives per HPI. No fever uti sx injury    Past Medical History:  Diagnosis Date  . Allergic rhinitis   . Aortic insufficiency   . CAT SCRATCH 09/10/2009   Qualifier: Diagnosis of  By: Regis Bill MD, [name]   . Clavicle fracture 3/08   left  . Diverticulosis   . HTN (hypertension)   . Osteopenia   . Paroxysmal atrial fibrillation (HCC)   . Vision disturbance 03/13/2013   Negative neuro  MRA MRI    Past Surgical History:  Procedure Laterality Date  . ABDOMINAL HYSTERECTOMY     pt denies 04/15/14  . APPENDECTOMY    . LAPAROSCOPIC SALPINGO OOPHERECTOMY Right   . TONSILLECTOMY AND ADENOIDECTOMY      Family History  Problem Relation Age of Onset  . Scleroderma Mother   . Hyperlipidemia Mother   . Coronary artery disease Father 53  . Esophageal cancer Paternal Grandmother     Social History   Tobacco Use  . Smoking status: Never Smoker  . Smokeless tobacco: Never Used  Substance Use Topics  . Alcohol use: No    Comment: never  . Drug use: No      Current Outpatient Medications:  .  acetaminophen (TYLENOL) 500 MG tablet, Take 500 mg by mouth as needed., Disp: , Rfl:  .  alendronate (FOSAMAX) 70 MG tablet, TAKE 1 TABLET ONCE A WEEK  WITH A FULL GLASS OF WATER  ON AN EMPTY STOMACH, Disp: 12 tablet, Rfl: 3 .  Calcium Carbonate-Vitamin D (CALCIUM PLUS VITAMIN D PO), Take 1 tablet by mouth daily. , Disp: , Rfl:  .  Cholecalciferol (VITAMIN D3) 1000 units CAPS, Take 2 capsules by mouth daily. , Disp: , Rfl:  .  diltiazem (CARDIZEM CD) 300 MG 24 hr capsule, TAKE 1 CAPSULE BY MOUTH  DAILY, Disp: 90 capsule, Rfl: 3 .  ELIQUIS 5 MG TABS tablet, TAKE 1 TABLET BY MOUTH TWO  TIMES DAILY, Disp: 180 tablet, Rfl: 1 .  flecainide (TAMBOCOR) 50 MG tablet, TAKE 1 AND 1/2 TABLETS BY  MOUTH TWO TIMES DAILY (DOSE INCREASE), Disp: 270 tablet, Rfl: 2 .  ibuprofen (ADVIL,MOTRIN) 800 MG tablet, Take 800 mg by mouth 2 (two) times daily as needed for moderate pain. , Disp: , Rfl:  .  LORazepam (ATIVAN) 0.5 MG tablet, Take 1 tablet (0.5 mg total) by mouth 3 (three) times daily as needed for anxiety. For travel, Disp: 20 tablet, Rfl: 0 .  metoprolol tartrate (LOPRESSOR) 25 MG tablet, TAKE 1 TABLET BY MOUTH TWO  TIMES DAILY, Disp: 180 tablet, Rfl: 1 .  montelukast (SINGULAIR) 10 MG tablet, TAKE 1 TABLET BY MOUTH  DAILY, Disp: 90 tablet, Rfl: 0 .  Multiple Vitamins-Minerals (PRESERVISION AREDS  PO), Take 1 tablet by mouth 2 (two) times daily. , Disp: , Rfl:   EXAM: BP Readings from Last 3 Encounters:  09/23/18 128/60  09/17/18 (!) 144/78  05/17/18 (!) 142/68    VITALS per patient if applicable: choppy  Video visit  So finished the visit on the phone but  I was able to see her and location of  The area of nujmbness   GENERAL: alert, oriented, appears well and in no acute distress  HEENT: atraumatic, conjunttiva clear, no obvious abnormalities on inspection of external nose and ears  NECK: normal movements of the head and neck  LUNGS: on inspection no signs of respiratory distress, breathing rate appears normal, no obvious gross SOB, gasping or wheezing  CV: no obvious cyanosis  MS: moves all visible extremities without noticeable abnormality points to the area below inguijal ligament on left  And some medial lateral   No rash or    Color changes   Walks normally and no obv masses   PSYCH/NEURO: pleasant and cooperative, no obvious depression or anxiety, speech and thought processing grossly intact   ASSESSMENT AND PLAN:  Discussed the following assessment and plan:  Numbness - left proximal anterior thight prob compression of sensory nerver  disc diff dx follow avoid compression and  Suspect   Compression peripheral  sensory nerve.  By context   No obv cause  Disc avoiding positional and   Compression of the area and  If feeling any lump or mass  And if no motor or  persistent or progressive sx an observe and let me know in a month   She states that a previous  Numbness  Resolved in same leg   .  Counseled.  This  Does not present like a a heart  cv  condition related  Problem  Based on visit today   consdier further eval if  persistent or progressive etc  Expectant management and discussion of plan and treatment with patient with opportunity to ask questions and all were answered. The patient agreed with the plan and demonstrated an understanding of the instructions.   labs  cpx  Due due July b\ The patient was advised to call back or seek an in-person evaluation if worsening  or having concerns .     Shanon Ace, MD

## 2019-02-18 ENCOUNTER — Encounter: Payer: Self-pay | Admitting: Internal Medicine

## 2019-02-18 ENCOUNTER — Other Ambulatory Visit: Payer: Self-pay

## 2019-02-18 ENCOUNTER — Ambulatory Visit (INDEPENDENT_AMBULATORY_CARE_PROVIDER_SITE_OTHER): Payer: Medicare Other | Admitting: Internal Medicine

## 2019-02-18 DIAGNOSIS — R200 Anesthesia of skin: Secondary | ICD-10-CM

## 2019-02-18 NOTE — Patient Instructions (Signed)
Plan  Get [place] update  In a month

## 2019-02-19 NOTE — Telephone Encounter (Signed)
Agree 

## 2019-02-24 ENCOUNTER — Telehealth: Payer: Self-pay | Admitting: *Deleted

## 2019-02-24 NOTE — Telephone Encounter (Signed)
LMOM @ 10:32 am, re: appointment.

## 2019-03-05 DIAGNOSIS — H35372 Puckering of macula, left eye: Secondary | ICD-10-CM | POA: Diagnosis not present

## 2019-03-05 DIAGNOSIS — H2511 Age-related nuclear cataract, right eye: Secondary | ICD-10-CM | POA: Diagnosis not present

## 2019-03-05 DIAGNOSIS — H35371 Puckering of macula, right eye: Secondary | ICD-10-CM | POA: Diagnosis not present

## 2019-03-05 DIAGNOSIS — H353132 Nonexudative age-related macular degeneration, bilateral, intermediate dry stage: Secondary | ICD-10-CM | POA: Diagnosis not present

## 2019-03-10 DIAGNOSIS — Z124 Encounter for screening for malignant neoplasm of cervix: Secondary | ICD-10-CM | POA: Diagnosis not present

## 2019-03-14 DIAGNOSIS — R609 Edema, unspecified: Secondary | ICD-10-CM

## 2019-03-14 NOTE — Telephone Encounter (Signed)
Please call her  As she requests   Not sure they are related but if having ongoing  knee swelling and difficulty would have you see ortho or sports medicine   About the knee

## 2019-03-14 NOTE — Telephone Encounter (Signed)
Spoke with patient. She has agreed to ortho referral. Referral has been placed.

## 2019-03-15 ENCOUNTER — Other Ambulatory Visit: Payer: Self-pay | Admitting: Cardiology

## 2019-03-16 ENCOUNTER — Other Ambulatory Visit: Payer: Self-pay | Admitting: Internal Medicine

## 2019-03-17 NOTE — Telephone Encounter (Signed)
Not sure what to do with this message   Check on referral to been seen sooner than later  and tell patient

## 2019-03-17 NOTE — Telephone Encounter (Signed)
Please advise. Referral to ortho is still being processed.

## 2019-03-18 ENCOUNTER — Telehealth: Payer: Self-pay | Admitting: Pharmacist Clinician (PhC)/ Clinical Pharmacy Specialist

## 2019-03-18 NOTE — Telephone Encounter (Signed)
Patient called to ask if tart cherry juice would interact with her medications.    Reviewed Web MD and [email or web address].  Neither have any indication that it would be a problem.  LMOM for patient that she could take.  If she notices any problems after starting the juice she should give [place] a call.

## 2019-03-19 NOTE — Telephone Encounter (Signed)
Just got this message   Did she get her ortho appt yet?  Please move it to asap

## 2019-03-25 ENCOUNTER — Ambulatory Visit: Payer: Medicare Other | Admitting: Orthopaedic Surgery

## 2019-03-25 ENCOUNTER — Other Ambulatory Visit: Payer: Self-pay

## 2019-03-25 ENCOUNTER — Encounter: Payer: Self-pay | Admitting: Orthopaedic Surgery

## 2019-03-25 ENCOUNTER — Ambulatory Visit: Payer: Self-pay

## 2019-03-25 VITALS — BP 137/83 | HR 104 | Ht 65.0 in | Wt 110.0 lb

## 2019-03-25 DIAGNOSIS — M25561 Pain in right knee: Secondary | ICD-10-CM

## 2019-03-25 NOTE — Progress Notes (Signed)
Office Visit Note   Patient: [name] [name]           Date of Birth: 11-Jul-1951           MRN: [id number] Visit Date: 03/25/2019              Requested by: Burnis Medin, MD [place], [place] [id number] PCP: Burnis Medin, MD   Assessment & Plan: Visit Diagnoses:  1. Acute pain of right knee     Plan: Osteoarthritis right knee with predominant changes about the patellofemoral joint laterally.  Long discussion regarding treatment options including Voltaren gel, exercises cortisone injection.  Mrs. Weingart has history of A. fib and is on Eliquis and is limited in her use of anti-inflammatory meds.  She like to try the gel and exercises and consider cortisone injection over the next several weeks if no improvement.  She has had some discomfort off and on in the area of her left groin but had excellent range of motion without any pain with motion of both hips today.  Follow-Up Instructions: Return if symptoms worsen or fail to improve.   Orders:  Orders Placed This Encounter  Procedures  . XR KNEE 3 VIEW RIGHT   No orders of the defined types were placed in this encounter.     Procedures: No procedures performed   Clinical Data: No additional findings.   Subjective: Chief Complaint  Patient presents with  . Right Knee - Pain  Patient presents today with right knee pain. She said that she was crawling on the floor almost three weeks ago. She noticed it was sore afterwards, but seems to have gotten worse after walking. Her knee is swollen and feels weak. She has taken some OTC pain medicine as needed.  Has history of atrial fibrillation and is on Eliquis.  Has been conferring with her cardiologist regarding her medicines.  Feeling better this week than last week.  Not having any groin or back pain.  No numbness or tingling  HPI  Review of Systems  Constitutional: Negative for fatigue.  HENT: Negative for ear pain.   Eyes: Negative for pain.  Respiratory:  Negative for shortness of breath.   Cardiovascular: Negative for leg swelling.  Gastrointestinal: Negative for constipation and diarrhea.  Endocrine: Negative for cold intolerance and heat intolerance.  Genitourinary: Negative for difficulty urinating.  Musculoskeletal: Positive for joint swelling.  Skin: Negative for rash.  Allergic/Immunologic: Negative for food allergies.  Neurological: Negative for weakness.  Hematological: Does not bruise/bleed easily.  Psychiatric/Behavioral: Negative for sleep disturbance.     Objective: Vital Signs: BP 137/83   Pulse (!) 104   Ht 5\' 5"  (1.651 m)   Wt 110 lb (49.9 kg)   BMI 18.30 kg/m   Physical Exam Constitutional:      Appearance: She is well-developed.  Eyes:     Pupils: Pupils are equal, round, and reactive to light.  Pulmonary:     Effort: Pulmonary effort is normal.  Skin:    General: Skin is warm and dry.  Neurological:     Mental Status: She is alert and oriented to person, place, and time.  Psychiatric:        Behavior: Behavior normal.     Ortho Exam right knee with possibly very small effusion.  The knee was not hot red or warm.  Does have some fullness in the popliteal space consistent with a small Baker's cyst but asymptomatic.  I thought she lacked  a few degrees of full right knee extension compared to her left knee.  Flexed over 105 degrees without instability.  No pain with range of motion of either hip with internal and external rotation.  Calf pain.  No distal edema.  Mild venous stasis changes in both ankles  Specialty Comments:  No specialty comments available.  Imaging: Xr Knee 3 View Right  Result Date: 03/25/2019 Films of the right knee were obtained in 3 projections standing.  There is significant narrowing of the lateral patellofemoral articulation with peripheral osteophytes and slight lateral subluxation.  Mild decrease in the medial joint space.  No ectopic calcification or acute changes.    PMFS  History: Patient Active Problem List   Diagnosis Date Noted  . Essential hypertension 06/30/2017  . Age related osteoporosis 10/19/2016  . Paroxysmal atrial fibrillation ([place]) 09/27/2015  . Hemorrhage of rectum and anus 04/16/2014  . Special screening for malignant neoplasms, colon 04/16/2014  . Hemorrhoids, unspecified hemorrhoid type 04/16/2014  . Plantar wart of left foot 05/14/2013  . Foot callus 05/14/2013  . Bilateral bunions 03/13/2013  . Hypertrophic toenail 03/13/2013  . Vision disturbance 03/13/2013  . Post-nasal drainage 05/05/2012  . Aortic insufficiency 01/16/2012  . Macular degeneration 10/12/2011  . ANXIETY, SITUATIONAL 05/08/2008  . Vitamin D insufficiency 05/22/2007  . ELEVATED BLOOD PRESSURE WITHOUT DIAGNOSIS OF HYPERTENSION 05/22/2007  . Allergic rhinitis due to allergen 03/28/2007   Past Medical History:  Diagnosis Date  . Allergic rhinitis   . Aortic insufficiency   . CAT SCRATCH 09/10/2009   Qualifier: Diagnosis of  By: Regis Bill MD, [name]   . Clavicle fracture 3/08   left  . Diverticulosis   . HTN (hypertension)   . Osteopenia   . Paroxysmal atrial fibrillation (HCC)   . Vision disturbance 03/13/2013   Negative neuro MRA MRI    Family History  Problem Relation Age of Onset  . Scleroderma Mother   . Hyperlipidemia Mother   . Coronary artery disease Father 47  . Esophageal cancer Paternal Grandmother     Past Surgical History:  Procedure Laterality Date  . ABDOMINAL HYSTERECTOMY     pt denies 04/15/14  . APPENDECTOMY    . LAPAROSCOPIC SALPINGO OOPHERECTOMY Right   . TONSILLECTOMY AND ADENOIDECTOMY     Social History   Occupational History  . Occupation: Veterinary surgeon  . Occupation: REPORTER    Employer: [employer]  Tobacco Use  . Smoking status: Never Smoker  . Smokeless tobacco: Never Used  Substance and Sexual Activity  . Alcohol use: No    Comment: never  . Drug use: No  . Sexual activity: Not on file

## 2019-03-26 ENCOUNTER — Telehealth: Payer: Self-pay | Admitting: Pharmacist

## 2019-03-26 NOTE — Telephone Encounter (Signed)
Patient wants to know if okay to take Voltaren gel, and steroid injection for arthritis management.   Recommendation: okay to follow recommendation from orthopedic doctor.  Voltaren gel is topical and should not affect patient systemically

## 2019-03-28 ENCOUNTER — Ambulatory Visit: Payer: Medicare Other | Admitting: Orthopaedic Surgery

## 2019-03-31 ENCOUNTER — Other Ambulatory Visit: Payer: Self-pay

## 2019-03-31 ENCOUNTER — Ambulatory Visit: Payer: Medicare Other | Admitting: Orthopaedic Surgery

## 2019-03-31 ENCOUNTER — Encounter: Payer: Self-pay | Admitting: Orthopaedic Surgery

## 2019-03-31 VITALS — BP 141/63 | HR 56 | Ht 65.0 in | Wt 110.0 lb

## 2019-03-31 DIAGNOSIS — M25561 Pain in right knee: Secondary | ICD-10-CM | POA: Diagnosis not present

## 2019-03-31 DIAGNOSIS — G8929 Other chronic pain: Secondary | ICD-10-CM | POA: Diagnosis not present

## 2019-03-31 MED ORDER — LIDOCAINE HCL 1 % IJ SOLN
2.0000 mL | INTRAMUSCULAR | Status: AC | PRN
  Administered 2019-03-31: 2 mL

## 2019-03-31 MED ORDER — BUPIVACAINE HCL 0.5 % IJ SOLN
2.0000 mL | INTRAMUSCULAR | Status: AC | PRN
  Administered 2019-03-31: 2 mL via INTRA_ARTICULAR

## 2019-03-31 MED ORDER — METHYLPREDNISOLONE ACETATE 40 MG/ML IJ SUSP
80.0000 mg | INTRAMUSCULAR | Status: AC | PRN
  Administered 2019-03-31: 80 mg via INTRA_ARTICULAR

## 2019-03-31 NOTE — Progress Notes (Signed)
Office Visit Note   Patient: [name] [name]           Date of Birth: 08/27/1951           MRN: [id number] Visit Date: 03/31/2019              Requested by: Burnis Medin, MD [place], [place] [address] PCP: Burnis Medin, MD   Assessment & Plan: Visit Diagnoses:  1. Chronic pain of right knee     Plan:  right knee pain that appears to be degenerative in origin.  [name] returns for cortisone injection.  Also had long discussion regarding use of Voltaren gel.  She will check with her cardiologist that she is on Eliquis  Follow-Up Instructions: Return if symptoms worsen or fail to improve.   Orders:  Orders Placed This Encounter  Procedures  . Large Joint Inj: R knee   No orders of the defined types were placed in this encounter.     Procedures: Large Joint Inj: R knee on 03/31/2019 9:06 AM Indications: pain and diagnostic evaluation Details: 25 G 1.5 in needle, anteromedial approach  Arthrogram: No  Medications: 2 mL lidocaine 1 %; 2 mL bupivacaine 0.5 %; 80 mg methylPREDNISolone acetate 40 MG/ML Procedure, treatment alternatives, risks and benefits explained, specific risks discussed. Consent was given by the patient. Immediately prior to procedure a time out was called to verify the correct patient, procedure, equipment, support staff and site/side marked as required. Patient was prepped and draped in the usual sterile fashion.       Clinical Data: No additional findings.   Subjective: Chief Complaint  Patient presents with  . Right Knee - Follow-up  Patient presents today for follow up of her right knee. She was last seen one week ago. She is here today for a cortisone injection.   HPI  Review of Systems   Objective: Vital Signs: BP (!) 141/63   Pulse (!) 56   Ht 5\' 5"  (1.651 m)   Wt 110 lb (49.9 kg)   BMI 18.30 kg/m   Physical Exam Constitutional:      Appearance: She is well-developed.  Eyes:     Pupils: Pupils are  equal, round, and reactive to light.  Pulmonary:     Effort: Pulmonary effort is normal.  Skin:    General: Skin is warm and dry.  Neurological:     Mental Status: She is alert and oriented to person, place, and time.  Psychiatric:        Behavior: Behavior normal.     Ortho Exam awake alert and oriented x3.  Comfortable sitting.  Right knee without effusion.  Predominately medial joint pain.  Some patellar crepitation.  No effusion.  No instability. Specialty Comments:  No specialty comments available.  Imaging: No results found.   PMFS History: Patient Active Problem List   Diagnosis Date Noted  . Chronic pain of right knee 03/31/2019  . Essential hypertension 06/30/2017  . Age related osteoporosis 10/19/2016  . Paroxysmal atrial fibrillation ([place] 09/27/2015  . Hemorrhage of rectum and anus 04/16/2014  . Special screening for malignant neoplasms, colon 04/16/2014  . Hemorrhoids, unspecified hemorrhoid type 04/16/2014  . Plantar wart of left foot 05/14/2013  . Foot callus 05/14/2013  . Bilateral bunions 03/13/2013  . Hypertrophic toenail 03/13/2013  . Vision disturbance 03/13/2013  . Post-nasal drainage 05/05/2012  . Aortic insufficiency 01/16/2012  . Macular degeneration 10/12/2011  . ANXIETY, SITUATIONAL 05/08/2008  . Vitamin  D insufficiency 05/22/2007  . ELEVATED BLOOD PRESSURE WITHOUT DIAGNOSIS OF HYPERTENSION 05/22/2007  . Allergic rhinitis due to allergen 03/28/2007   Past Medical History:  Diagnosis Date  . Allergic rhinitis   . Aortic insufficiency   . CAT SCRATCH 09/10/2009   Qualifier: Diagnosis of  By: Regis Bill MD, [name]   . Clavicle fracture 3/08   left  . Diverticulosis   . HTN (hypertension)   . Osteopenia   . Paroxysmal atrial fibrillation ([facility])   . Vision disturbance 03/13/2013   Negative neuro MRA MRI    Family History  Problem Relation Age of Onset  . Scleroderma Mother   . Hyperlipidemia Mother   . Coronary artery disease Father 66  .  Esophageal cancer Paternal Grandmother     Past Surgical History:  Procedure Laterality Date  . ABDOMINAL HYSTERECTOMY     pt denies 04/15/14  . APPENDECTOMY    . LAPAROSCOPIC SALPINGO OOPHERECTOMY Right   . TONSILLECTOMY AND ADENOIDECTOMY     Social History   Occupational History  . Occupation: Veterinary surgeon  . Occupation: REPORTER    Employer: [employer]  Tobacco Use  . Smoking status: Never Smoker  . Smokeless tobacco: Never Used  Substance and Sexual Activity  . Alcohol use: No    Comment: never  . Drug use: No  . Sexual activity: Not on file

## 2019-04-02 ENCOUNTER — Telehealth: Payer: Self-pay | Admitting: Cardiology

## 2019-04-02 NOTE — Telephone Encounter (Signed)
Mychart, smartphone, consent (verbal), pre reg complete 04/02/19 AF

## 2019-04-03 ENCOUNTER — Other Ambulatory Visit: Payer: Self-pay

## 2019-04-03 ENCOUNTER — Encounter: Payer: Self-pay | Admitting: Orthopaedic Surgery

## 2019-04-03 ENCOUNTER — Ambulatory Visit: Payer: Medicare Other | Admitting: Orthopaedic Surgery

## 2019-04-03 VITALS — BP 136/64 | HR 59 | Ht 65.0 in | Wt 110.0 lb

## 2019-04-03 DIAGNOSIS — G8929 Other chronic pain: Secondary | ICD-10-CM | POA: Diagnosis not present

## 2019-04-03 DIAGNOSIS — M25561 Pain in right knee: Secondary | ICD-10-CM | POA: Diagnosis not present

## 2019-04-03 NOTE — Progress Notes (Signed)
Office Visit Note   Patient: [name] [name]           Date of Birth: [birth year]           MRN: [id number] Visit Date: 04/03/2019              Requested by: Burnis Medin, MD [place], [place] [address] PCP: Burnis Medin, MD   Assessment & Plan: Visit Diagnoses:  1. Chronic pain of right knee     Plan: Mrs. Deans was seen in the office on Monday for evaluation of her right knee pain.  I injected her knee with cortisone.  She had a misstep on Tuesday and was concerned that she may have "done something to my knee.  I think that she has had a mild reaction to the cortisone and she is fine at the present.  She had some stiffness and soreness which has resolved.  Her knee looks benign to me today.  We will just monitor her response over the next couple weeks and consider an MRI scan if no improvement  Follow-Up Instructions: Return Will call in the next 1 to 2 weeks if no improvement and would consider an MRI scan of the l right k.   Orders:  No orders of the defined types were placed in this encounter.  No orders of the defined types were placed in this encounter.     Procedures: No procedures performed   Clinical Data: No additional findings.   Subjective: Chief Complaint  Patient presents with  . Right Knee - Follow-up  Patient presents today for a three day follow up. She had a cortisone injection on 03/31/19 in her right knee. Patient states that the following day after the injection she stepped on uneven pavement. She said that her knee her immediately and was unable to flex her knee. She said that it has improved since. She is using the Voltaren Gel and icing.   HPI  Review of Systems  Constitutional: Negative for fatigue.  HENT: Negative for ear pain.   Eyes: Negative for pain.  Respiratory: Negative for shortness of breath.   Cardiovascular: Negative for leg swelling.  Gastrointestinal: Negative for constipation and diarrhea.  Endocrine:  Negative for cold intolerance and heat intolerance.  Genitourinary: Negative for difficulty urinating.  Musculoskeletal: Positive for joint swelling.  Skin: Negative for rash.  Allergic/Immunologic: Negative for food allergies.  Neurological: Negative for weakness.  Hematological: Does not bruise/bleed easily.  Psychiatric/Behavioral: Positive for sleep disturbance.     Objective: Vital Signs: BP 136/64   Pulse (!) 59   Ht 5\' 5"  (1.651 m)   Wt 110 lb (49.9 kg)   BMI 18.30 kg/m   Physical Exam  Ortho Exam right knee was not hot warm red or swollen.  No particular joint pain.  No popping or clicking.  Full extension and flexion without instability.  Walks without a limp  Specialty Comments:  No specialty comments available.  Imaging: No results found.   PMFS History: Patient Active Problem List   Diagnosis Date Noted  . Chronic pain of right knee 03/31/2019  . Essential hypertension 06/30/2017  . Age related osteoporosis 10/19/2016  . Paroxysmal atrial fibrillation ([place]) 09/27/2015  . Hemorrhage of rectum and anus 04/16/2014  . Special screening for malignant neoplasms, colon 04/16/2014  . Hemorrhoids, unspecified hemorrhoid type 04/16/2014  . Plantar wart of left foot 05/14/2013  . Foot callus 05/14/2013  . Bilateral bunions 03/13/2013  . Hypertrophic  toenail 03/13/2013  . Vision disturbance 03/13/2013  . Post-nasal drainage 05/05/2012  . Aortic insufficiency 01/16/2012  . Macular degeneration 10/12/2011  . ANXIETY, SITUATIONAL 05/08/2008  . Vitamin D insufficiency 05/22/2007  . ELEVATED BLOOD PRESSURE WITHOUT DIAGNOSIS OF HYPERTENSION 05/22/2007  . Allergic rhinitis due to allergen 03/28/2007   Past Medical History:  Diagnosis Date  . Allergic rhinitis   . Aortic insufficiency   . CAT SCRATCH 09/10/2009   Qualifier: Diagnosis of  By: Regis Bill MD, [name]   . Clavicle fracture 3/08   left  . Diverticulosis   . HTN (hypertension)   . Osteopenia   .  Paroxysmal atrial fibrillation ([facility])   . Vision disturbance 03/13/2013   Negative neuro MRA MRI    Family History  Problem Relation Age of Onset  . Scleroderma Mother   . Hyperlipidemia Mother   . Coronary artery disease Father 35  . Esophageal cancer Paternal Grandmother     Past Surgical History:  Procedure Laterality Date  . ABDOMINAL HYSTERECTOMY     pt denies 04/15/14  . APPENDECTOMY    . LAPAROSCOPIC SALPINGO OOPHERECTOMY Right   . TONSILLECTOMY AND ADENOIDECTOMY     Social History   Occupational History  . Occupation: Veterinary surgeon  . Occupation: REPORTER    Employer: [employer]  Tobacco Use  . Smoking status: Never Smoker  . Smokeless tobacco: Never Used  Substance and Sexual Activity  . Alcohol use: No    Comment: never  . Drug use: No  . Sexual activity: Not on file

## 2019-04-07 NOTE — Progress Notes (Signed)
Virtual Visit via Video Note   This visit type was conducted due to national recommendations for restrictions regarding the COVID-19 Pandemic (e.g. social distancing) in an effort to limit this patient's exposure and mitigate transmission in our community.  Due to her co-morbid illnesses, this patient is at least at moderate risk for complications without adequate follow up.  This format is felt to be most appropriate for this patient at this time.  All issues noted in this document were discussed and addressed.  A limited physical exam was performed with this format.  Please refer to the patient's chart for her consent to telehealth for [facility].   Date:  04/08/2019   ID:  [name] [name], DOB 04/14/1951, MRN [id number]  Patient Location: Home Provider Location: Home  PCP:  Burnis Medin, MD  Cardiologist:  Minus Breeding, MD  Electrophysiologist:  None   Evaluation Performed:  Follow-Up Visit  Chief Complaint:  Atrial fib  History of Present Illness:    [name] [name] is a 68 y.o. female who for follow up of palpitations and aortic insufficiency.  She has paroxysmal atrial fibrillation. She has moderate AI.  I sent her for a POET(Plain Old Exercise Treadmill)  (in anticipation that she would need flecainide.)  and she had atrial fib during recovery.  She was cared for by Dr. Claiborne Billings and was given Cardizem.  She had return to NSR later in the evening.   She does have an [employer] to track her HR .  She agreed to start flecainide and has been titrated to 75 mg bid but she did not want to increase to 100 mg despite the fact that she was still having some breakthrough palpitations.  She saw Dr. Rayann Heman and he discussed ablation and she is considering this.    Since I last saw her she has done OK.  She still gets occasional palpitations.  She had 1 in February.  She had a couple in April.  She had one in May.  She says they last maybe for an hour or 2 at a time.  She says sometimes she feels like  she is "fighting them off".  She is not having any presyncope or syncope.  No new chest pressure, neck or arm discomfort.  She will take an extra half of the beta-blocker and then maybe sometimes another half but she does not feel like this necessarily stops him or prevents him.  She is not describing any new chest pressure, neck or arm disorder.  She is had some problems with arthritis recently.  The patient does not have symptoms concerning for COVID-19 infection (fever, chills, cough, or new shortness of breath).    Past Medical History:  Diagnosis Date  . Allergic rhinitis   . Aortic insufficiency   . CAT SCRATCH 09/10/2009   Qualifier: Diagnosis of  By: Regis Bill MD, [name]   . Clavicle fracture 3/08   left  . Diverticulosis   . HTN (hypertension)   . Osteopenia   . Paroxysmal atrial fibrillation (HCC)   . Vision disturbance 03/13/2013   Negative neuro MRA MRI   Past Surgical History:  Procedure Laterality Date  . ABDOMINAL HYSTERECTOMY     pt denies 04/15/14  . APPENDECTOMY    . LAPAROSCOPIC SALPINGO OOPHERECTOMY Right   . TONSILLECTOMY AND ADENOIDECTOMY       Current Meds  Medication Sig  . acetaminophen (TYLENOL) 500 MG tablet Take 500 mg by mouth as needed.  [name] alendronate (  FOSAMAX) 70 MG tablet TAKE 1 TABLET ONCE A WEEK  WITH A FULL GLASS OF WATER  ON AN EMPTY STOMACH  . Calcium Carbonate-Vitamin D (CALCIUM PLUS VITAMIN D PO) Take 1 tablet by mouth daily.   . Cholecalciferol (VITAMIN D3) 1000 units CAPS Take 2 capsules by mouth daily.   [name] diltiazem (CARDIZEM CD) 300 MG 24 hr capsule TAKE 1 CAPSULE BY MOUTH  DAILY  . ELIQUIS 5 MG TABS tablet TAKE 1 TABLET BY MOUTH TWO  TIMES DAILY  . flecainide (TAMBOCOR) 50 MG tablet TAKE 1 AND 1/2 TABLETS BY  MOUTH TWO TIMES DAILY (DOSE INCREASE)  . LORazepam (ATIVAN) 0.5 MG tablet Take 1 tablet (0.5 mg total) by mouth 3 (three) times daily as needed for anxiety. For travel  . metoprolol tartrate (LOPRESSOR) 25 MG tablet TAKE 1 TABLET BY  MOUTH TWO  TIMES DAILY  . montelukast (SINGULAIR) 10 MG tablet TAKE 1 TABLET BY MOUTH  DAILY  . Multiple Vitamins-Minerals (PRESERVISION AREDS PO) Take 1 tablet by mouth 2 (two) times daily.      Allergies:   Codeine phosphate; Demerol [meperidine]; Meperidine hcl; and Sulfa antibiotics   Social History   Tobacco Use  . Smoking status: Never Smoker  . Smokeless tobacco: Never Used  Substance Use Topics  . Alcohol use: No    Comment: never  . Drug use: No     Family Hx: The patient's family history includes Coronary artery disease (age of onset: 57) in her father; Esophageal cancer in her paternal grandmother; Hyperlipidemia in her mother; Scleroderma in her mother.  ROS:   Please see the history of present illness.    As stated in the HPI and negative for all other systems.   Prior CV studies:   The following studies were reviewed today:  Echo  Labs/Other Tests and Data Reviewed:    EKG:  No ECG reviewed.  Recent Labs: 05/17/2018: ALT 23; BUN 16; Creatinine, Ser 0.73; Hemoglobin 15.3; Platelets 216.0; Potassium 4.3; Sodium 142; TSH 0.80   Recent Lipid Panel Lab Results  Component Value Date/Time   CHOL 226 (H) 05/17/2018 09:12 AM   TRIG 84.0 05/17/2018 09:12 AM   HDL 79.30 05/17/2018 09:12 AM   CHOLHDL 3 05/17/2018 09:12 AM   LDLCALC 129 (H) 05/17/2018 09:12 AM   LDLDIRECT 131.0 09/03/2009 08:13 AM    Wt Readings from Last 3 Encounters:  04/08/19 110 lb (49.9 kg)  04/03/19 110 lb (49.9 kg)  03/31/19 110 lb (49.9 kg)     Objective:    Vital Signs:  Ht 5\' 5"  (1.651 m)   Wt 110 lb (49.9 kg)   BMI 18.30 kg/m    VITAL SIGNS:  reviewed GEN:  no acute distress EYES:  sclerae anicteric, EOMI - Extraocular Movements Intact NEURO:  alert and oriented x 3, no obvious focal deficit PSYCH:  normal affect  ASSESSMENT & PLAN:    Aortic insufficiency -  I will check an echo in [place].   No change in therapy. y.    Atrial fib-  [name] [name] has a CHA2DS2 -  VASc score of 2.        She wants to continue current therapy.   HTN - The blood pressure is well controlled.  Continue current therapy.   COVID-19 Education: The signs and symptoms of COVID-19 were discussed with the patient and how to seek care for testing (follow up with PCP or arrange E-visit).  The importance of social distancing was discussed  today.  Time:   Today, I have spent 17 minutes with the patient with telehealth technology discussing the above problems.     Medication Adjustments/Labs and Tests Ordered: Current medicines are reviewed at length with the patient today.  Concerns regarding medicines are outlined above.   Tests Ordered: No orders of the defined types were placed in this encounter.   Medication Changes: No orders of the defined types were placed in this encounter.   Disposition:  Follow up with me in 6 months can be virtual or office  Signed, Minus Breeding, MD  04/08/2019 2:13 PM    [place]

## 2019-04-08 ENCOUNTER — Telehealth (INDEPENDENT_AMBULATORY_CARE_PROVIDER_SITE_OTHER): Payer: Medicare Other | Admitting: Cardiology

## 2019-04-08 ENCOUNTER — Encounter: Payer: Self-pay | Admitting: Cardiology

## 2019-04-08 VITALS — BP 122/78 | Ht 65.0 in | Wt 110.0 lb

## 2019-04-08 DIAGNOSIS — I480 Paroxysmal atrial fibrillation: Secondary | ICD-10-CM

## 2019-04-08 DIAGNOSIS — I351 Nonrheumatic aortic (valve) insufficiency: Secondary | ICD-10-CM

## 2019-04-08 MED ORDER — FLECAINIDE ACETATE 50 MG PO TABS: ORAL_TABLET | ORAL | 3 refills | Status: DC

## 2019-04-08 MED ORDER — APIXABAN 5 MG PO TABS: 5.0000 mg | ORAL_TABLET | Freq: Two times a day (BID) | ORAL | 3 refills | Status: DC

## 2019-04-08 NOTE — Patient Instructions (Signed)

## 2019-04-08 NOTE — Addendum Note (Signed)
Addended by: Vennie Homans on: 04/08/2019 02:42 PM   Modules accepted: Orders

## 2019-05-07 DIAGNOSIS — H0102B Squamous blepharitis left eye, upper and lower eyelids: Secondary | ICD-10-CM | POA: Diagnosis not present

## 2019-05-07 DIAGNOSIS — H00021 Hordeolum internum right upper eyelid: Secondary | ICD-10-CM | POA: Diagnosis not present

## 2019-05-07 DIAGNOSIS — H0102A Squamous blepharitis right eye, upper and lower eyelids: Secondary | ICD-10-CM | POA: Diagnosis not present

## 2019-05-08 NOTE — Telephone Encounter (Signed)
So if you are to have close exposure to newborn you can get re immunized with a tdap  At any time   Risk is very low for problems  Sometimes a  More sore arm than usual.    If  You think the eyelids issues is a stye ( plugged gland) then hot to warm compresses  3-4 x per day usually resolved this over 2-3 days .  Otherwise yes check with eye specialists

## 2019-05-16 ENCOUNTER — Other Ambulatory Visit: Payer: Self-pay | Admitting: Pharmacist

## 2019-05-16 MED ORDER — METOPROLOL TARTRATE 25 MG PO TABS: 25.0000 mg | ORAL_TABLET | Freq: Two times a day (BID) | ORAL | 0 refills | Status: DC

## 2019-06-25 DIAGNOSIS — H35373 Puckering of macula, bilateral: Secondary | ICD-10-CM | POA: Diagnosis not present

## 2019-06-25 DIAGNOSIS — H0102B Squamous blepharitis left eye, upper and lower eyelids: Secondary | ICD-10-CM | POA: Diagnosis not present

## 2019-06-25 DIAGNOSIS — H353131 Nonexudative age-related macular degeneration, bilateral, early dry stage: Secondary | ICD-10-CM | POA: Diagnosis not present

## 2019-06-25 DIAGNOSIS — H2513 Age-related nuclear cataract, bilateral: Secondary | ICD-10-CM | POA: Diagnosis not present

## 2019-06-25 DIAGNOSIS — H0102A Squamous blepharitis right eye, upper and lower eyelids: Secondary | ICD-10-CM | POA: Diagnosis not present

## 2019-07-16 ENCOUNTER — Telehealth: Payer: Self-pay

## 2019-07-16 DIAGNOSIS — Z1231 Encounter for screening mammogram for malignant neoplasm of breast: Secondary | ICD-10-CM | POA: Diagnosis not present

## 2019-07-16 LAB — HM MAMMOGRAPHY

## 2019-07-16 NOTE — Telephone Encounter (Signed)
Pt called in to let us know that they are having cataracts surgery and would like advice about meds

## 2019-07-16 NOTE — Telephone Encounter (Signed)
LMOM; patient to call back and discuss any questions.  No need to HOLD Eliquis for cataract surgery.

## 2019-07-16 NOTE — Telephone Encounter (Signed)
Patient to have cataract on Monday. Okay to continue taking current medication.

## 2019-07-16 NOTE — Telephone Encounter (Signed)
LMOM to call back

## 2019-07-21 DIAGNOSIS — H2512 Age-related nuclear cataract, left eye: Secondary | ICD-10-CM | POA: Diagnosis not present

## 2019-07-28 DIAGNOSIS — H2511 Age-related nuclear cataract, right eye: Secondary | ICD-10-CM | POA: Diagnosis not present

## 2019-08-04 DIAGNOSIS — H2511 Age-related nuclear cataract, right eye: Secondary | ICD-10-CM | POA: Diagnosis not present

## 2019-08-05 ENCOUNTER — Other Ambulatory Visit: Payer: Self-pay | Admitting: Internal Medicine

## 2019-08-27 ENCOUNTER — Encounter: Payer: Self-pay | Admitting: Internal Medicine

## 2019-09-02 ENCOUNTER — Ambulatory Visit (INDEPENDENT_AMBULATORY_CARE_PROVIDER_SITE_OTHER): Payer: Medicare Other

## 2019-09-02 ENCOUNTER — Other Ambulatory Visit: Payer: Self-pay

## 2019-09-02 ENCOUNTER — Encounter: Payer: Self-pay | Admitting: Internal Medicine

## 2019-09-02 DIAGNOSIS — Z23 Encounter for immunization: Secondary | ICD-10-CM

## 2019-09-18 DIAGNOSIS — H35361 Drusen (degenerative) of macula, right eye: Secondary | ICD-10-CM | POA: Diagnosis not present

## 2019-09-18 DIAGNOSIS — H35371 Puckering of macula, right eye: Secondary | ICD-10-CM | POA: Diagnosis not present

## 2019-09-18 DIAGNOSIS — H353132 Nonexudative age-related macular degeneration, bilateral, intermediate dry stage: Secondary | ICD-10-CM | POA: Diagnosis not present

## 2019-09-18 DIAGNOSIS — H35372 Puckering of macula, left eye: Secondary | ICD-10-CM | POA: Diagnosis not present

## 2019-09-20 ENCOUNTER — Encounter: Payer: Self-pay | Admitting: Internal Medicine

## 2019-09-20 ENCOUNTER — Other Ambulatory Visit: Payer: Self-pay | Admitting: Cardiology

## 2019-09-29 ENCOUNTER — Other Ambulatory Visit: Payer: Self-pay

## 2019-09-29 ENCOUNTER — Encounter: Payer: Self-pay | Admitting: Internal Medicine

## 2019-09-29 ENCOUNTER — Ambulatory Visit (INDEPENDENT_AMBULATORY_CARE_PROVIDER_SITE_OTHER): Payer: Medicare Other | Admitting: Internal Medicine

## 2019-09-29 DIAGNOSIS — M810 Age-related osteoporosis without current pathological fracture: Secondary | ICD-10-CM

## 2019-09-29 DIAGNOSIS — E559 Vitamin D deficiency, unspecified: Secondary | ICD-10-CM

## 2019-09-29 NOTE — Progress Notes (Signed)
Patient ID: [name] [name], female   DOB: May 10, 1951, 68 y.o.   MRN: [id number]   Patient location: Home My location: Office  Referring Provider: Burnis Medin, MD  I connected with the patient on 09/29/19 at  8:08 AM EST by a video enabled telemedicine application and verified that I am speaking with the correct person.   I discussed the limitations of evaluation and management by telemedicine and the availability of in person appointments. The patient expressed understanding and agreed to proceed.   Details of the encounter are shown below.  HPI  [name] [name] is a 68 y.o.-year-old female, returning for follow-up for osteoporosis.  Last visit 1 year ago. She has Medicare.  She had no fractures or falls since last visit.  She was diagnosed with osteoporosis in 2017.  Reviewed previous DXA scan reports - stable scores in 2019: DXA (09/23/2018) Lumbar spine L1-L4 Femoral neck (FN) 33% distal radius  T-score  -1.7 RFN: -2.7 LFN: -2.3 n/a  Change in BMD from previous DXA test (%) Up 1.4% Down 0.4% n/a   Date L1-L4 T score FN T score FRAX   07/27/2016  -1.8 (-3.4%*) RFN: -2.6 (-10.2%*) LFN: -2.4   09/30/2010  -1.6 RFN: -2.0 LFN: -1.8 10 year MOF: 11.9%  10 year hip fracture: 2.2%    She has a history of fractures: 1. collarbone - ~2010 - fell in a parking lot 2. L foot fx and sprained ankle - few years ago - tripped and fell 3. R foot fx and sprained ankle - 10/2015  She denies dizziness/vertigo/orthostasis/poor vision.  Previous osteoporosis treatments: - Fosamax for 5 years  - up to 2011 - Restarted Fosamax 70 mg weekly - since 12/2016  She has a history of vitamin D insufficiency: Lab Results  Component Value Date   VD25OH 43.25 09/23/2018   VD25OH 40.16 10/19/2017   VD25OH 31.41 12/20/2016   VD25OH 28.88 (L) 09/14/2016   She is on calcium and vitamin D:  - 600 mg calcium  - 2400 mg vitamin D  She continues to walk in the neighborhood during the summer and she is  doing aerobic classes/strength 1-2 times a week during the winter.  She is not taking high vitamin A doses.  Menopause was in her late 61s.  She was on HRT.  Pt does have a FH of osteoporosis in mother - kyphosis.  No fractures.  No history of kidney stones.  No history of hyper or hypocalcemia or hyperparathyroidism: Lab Results  Component Value Date   PTH 59 09/14/2016   CALCIUM 9.6 05/17/2018   CALCIUM 9.4 10/19/2017   CALCIUM 9.6 09/14/2016   CALCIUM 9.5 10/15/2014   CALCIUM 9.5 04/14/2014   CALCIUM 9.4 04/13/2014   CALCIUM 9.4 03/13/2013   CALCIUM 9.4 12/08/2011   CALCIUM 9.4 09/03/2009   CALCIUM 9.8 03/05/2007   No history of thyrotoxicosis Lab Results  Component Value Date   TSH 0.80 05/17/2018   TSH 1.22 09/14/2016   TSH 1.065 08/25/2015   TSH 1.04 10/15/2014   TSH 0.53 12/08/2011   No history of CKD last BUN/Cr: Lab Results  Component Value Date   BUN 16 05/17/2018   CREATININE 0.73 05/17/2018   She is on flecainide and Metoprolol for A. fib and continues Eliquis. She is going to the Afib clinic.  She noticed that after stopping Singulair, she had less A. fib episodes.  She goes to [facility] >> pelvic training.   ROS: Constitutional: no weight gain/no weight  loss, no fatigue, no subjective hyperthermia, no subjective hypothermia Eyes: no blurry vision, no xerophthalmia ENT: no sore throat, no nodules palpated in neck, no dysphagia, no odynophagia, no hoarseness Cardiovascular: no CP/no SOB/no palpitations/no leg swelling Respiratory: no cough/no SOB/no wheezing Gastrointestinal: no N/no V/no D/no C/no acid reflux Musculoskeletal: no muscle aches/no joint aches Skin: no rashes, no hair loss Neurological: no tremors/no numbness/no tingling/no dizziness  I reviewed pt's medications, allergies, PMH, social hx, family hx, and changes were documented in the history of present illness. Otherwise, unchanged from my initial visit note.  Past Medical  History:  Diagnosis Date  . Allergic rhinitis   . Aortic insufficiency   . CAT SCRATCH 09/10/2009   Qualifier: Diagnosis of  By: Regis Bill MD, [name]   . Clavicle fracture 3/08   left  . Diverticulosis   . HTN (hypertension)   . Osteopenia   . Paroxysmal atrial fibrillation (HCC)   . Vision disturbance 03/13/2013   Negative neuro MRA MRI   Past Surgical History:  Procedure Laterality Date  . ABDOMINAL HYSTERECTOMY     pt denies 04/15/14  . APPENDECTOMY    . LAPAROSCOPIC SALPINGO OOPHERECTOMY Right   . TONSILLECTOMY AND ADENOIDECTOMY     Social History   Social History  . Marital status: Married    Spouse name: N/A  . Number of children: 0 - had infertility tx's in her 12s   Occupational History  . 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 Smoking status: Never Smoker  . Smokeless tobacco: Never Used  . Alcohol use No     Comment: never  . Drug use: No   Social History Narrative   Married   hh of 2    p0g0   Works for [employer] and record    Current Outpatient Medications on File Prior to Visit  Medication Sig Dispense Refill  . acetaminophen (TYLENOL) 500 MG tablet Take 500 mg by mouth as needed.    [name] alendronate (FOSAMAX) 70 MG tablet TAKE 1 TABLET ONCE A WEEK  WITH A FULL GLASS OF WATER  ON AN EMPTY STOMACH 12 tablet 3  . apixaban (ELIQUIS) 5 MG TABS tablet Take 1 tablet (5 mg total) by mouth 2 (two) times daily. 180 tablet 3  . Calcium Carbonate-Vitamin D (CALCIUM PLUS VITAMIN D PO) Take 1 tablet by mouth daily.     . Cholecalciferol (VITAMIN D3) 1000 units CAPS Take 2 capsules by mouth daily.     [name] diltiazem (CARDIZEM CD) 300 MG 24 hr capsule TAKE 1 CAPSULE BY MOUTH  DAILY 90 capsule 3  . flecainide (TAMBOCOR) 50 MG tablet TAKE 1 AND 1/2 TABLETS BY  MOUTH TWO TIMES DAILY (DOSE INCREASE) 270 tablet 3  . ibuprofen (ADVIL,MOTRIN) 800 MG tablet Take 800 mg by mouth 2 (two) times daily as needed for moderate pain.     [name] LORazepam  (ATIVAN) 0.5 MG tablet Take 1 tablet (0.5 mg total) by mouth 3 (three) times daily as needed for anxiety. For travel 20 tablet 0  . metoprolol tartrate (LOPRESSOR) 25 MG tablet TAKE 1 TABLET BY MOUTH TWO  TIMES DAILY 180 tablet 2  . metoprolol tartrate (LOPRESSOR) 25 MG tablet Take 1 tablet (25 mg total) by mouth 2 (two) times daily. 8 tablet 0  . montelukast (SINGULAIR) 10 MG tablet TAKE 1 TABLET BY MOUTH  DAILY 90 tablet 0  . Multiple Vitamins-Minerals (PRESERVISION AREDS PO) Take 1 tablet by  mouth 2 (two) times daily.      No current facility-administered medications on file prior to visit.    Allergies  Allergen Reactions  . Codeine Phosphate     drowsy  . Demerol [Meperidine] Nausea Only  . Meperidine Hcl Nausea Only  . Sulfa Antibiotics     Rash   Family History  Problem Relation Age of Onset  . Scleroderma Mother   . Hyperlipidemia Mother   . Coronary artery disease Father 35  . Esophageal cancer Paternal Grandmother    PE: There were no vitals taken for this visit. Wt Readings from Last 3 Encounters:  04/08/19 110 lb (49.9 kg)  04/03/19 110 lb (49.9 kg)  03/31/19 110 lb (49.9 kg)   Constitutional:  in NAD  The physical exam was not performed (virtual visit).  Assessment: 1. Osteoporosis  2. Vit D insufficiency  Plan: 1. Osteoporosis -Likely postmenopausal/age-related if she also has a family history of osteoporosis -She continues to remain at the high risk for fracture since her T-scores are lower than -2.5 -She agreed to Prolia in the past, however, this was too expensive so we started Fosamax 70 mg daily in 12/2016.  She denies side effects from the medication: No bone pain, no hip/thigh pain, no jaw pain.  No dental work in progress. -Latest DEXA scan was from a year ago and the T-scores were stable.  We discussed that this is a positive result. -She continues on calcium and vitamin D -Again discussed about fall prevention. No falls since last OV. -Also,  last visit I recommended some weightbearing exercises (with elastic bands). She is using weights, but not exercising as frequently as she needed.  We discussed about getting into her routine and exercising approximately 30 minutes a day, 5 times a week. -We will check a BMP when she returns to the clinic (now coronavirus pandemic)  2. Vit D insufficiency -Continues on 2400 units vitamin D daily. -She also takes a calcium supplement -At last visit vitamin D level was normal, at goal - We will recheck this when she returns to the clinic  Orders Placed This Encounter  Procedures  . BASIC METABOLIC PANEL WITH GFR  . VITAMIN D 25 Hydroxy (Vit-D Deficiency, Fractures)   Philemon Kingdom, MD PhD [facility]

## 2019-09-29 NOTE — Patient Instructions (Signed)
Continue Fosamax 70 mg weekly.  Please return to the clinic for labs as soon as safe.  Please come back for a follow-up appointment in 1 year.

## 2019-10-04 NOTE — Progress Notes (Signed)
Cardiology Office Note   Date:  10/06/2019   ID:  [name] [name], DOB 09-22-1951, MRN [id number]  PCP:  [name] Medin, [name]  Cardiologist:   [name] Breeding, [name]   Chief Complaint  Patient presents with  . Atrial Fibrillation      History of Present Illness: [name] [name] is a 68 y.o. female who for follow up of palpitations and aortic insufficiency.  She has paroxysmal atrial fibrillation. She has moderate AI.  I sent her for a POET(Plain Old Exercise Treadmill)  (in anticipation that she would need flecainide.)  and she had atrial fib during recovery.  She was cared for by Dr. Claiborne [name] and was given Cardizem.  She had return to [facility] later in the evening.   She does have an AliveCor to track her HR .  She agreed to start flecainide and has been titrated to 75 mg bid but she did not want to increase to 100 mg despite the fact that she was still having some breakthrough palpitations.  She saw Dr. Rayann [name] and he discussed ablation and she is considering this.    Since I last saw her she stopped taking Singulair.  She thinks this helped a lot with her atrial fibrillation.  1 month ago she had several hours of atrial fibrillation but otherwise very infrequently.  She says this is much better.  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 The patient denies any new symptoms such as chest discomfort, neck or arm discomfort. There has been no new shortness of breath, PND or orthopnea. There have been no reported palpitations, presyncope or syncope.  Past Medical History:  Diagnosis Date  . Allergic rhinitis   . Aortic insufficiency   . CAT SCRATCH 09/10/2009   Qualifier: Diagnosis of  By: [name] [name], [name] [name]   . Clavicle fracture 3/08   left  . Diverticulosis   . HTN (hypertension)   . Osteopenia   . Paroxysmal atrial fibrillation (HCC)   . Vision disturbance 03/13/2013   Negative neuro MRA MRI    Past Surgical History:  Procedure Laterality Date  . ABDOMINAL HYSTERECTOMY     pt  denies 04/15/14  . APPENDECTOMY    . LAPAROSCOPIC SALPINGO OOPHERECTOMY Right   . TONSILLECTOMY AND ADENOIDECTOMY       Current Outpatient Medications  Medication Sig Dispense Refill  . acetaminophen (TYLENOL) 500 MG tablet Take 500 mg by mouth as needed.    [name] alendronate (FOSAMAX) 70 MG tablet TAKE 1 TABLET ONCE A WEEK  WITH A FULL GLASS OF WATER  ON AN EMPTY STOMACH 12 tablet 3  . apixaban (ELIQUIS) 5 MG TABS tablet Take 1 tablet (5 mg total) by mouth 2 (two) times daily. 180 tablet 3  . Calcium Carbonate-Vitamin D (CALCIUM PLUS VITAMIN D PO) Take 1 tablet by mouth daily.     . Cholecalciferol (VITAMIN D3) 1000 units CAPS Take 2 capsules by mouth daily.     [name] diltiazem (CARDIZEM CD) 300 MG 24 hr capsule TAKE 1 CAPSULE BY MOUTH  DAILY 90 capsule 3  . flecainide (TAMBOCOR) 50 MG tablet TAKE 1 AND 1/2 TABLETS BY  MOUTH TWO TIMES DAILY (DOSE INCREASE) 270 tablet 3  . ibuprofen (ADVIL,MOTRIN) 800 MG tablet Take 800 mg by mouth 2 (two) times daily as needed for moderate pain.     [name] LORazepam (ATIVAN) 0.5 MG tablet Take 1 tablet (0.5 mg total) by mouth 3 (three) times daily as needed for  anxiety. For travel 20 tablet 0  . metoprolol tartrate (LOPRESSOR) 25 MG tablet TAKE 1 TABLET BY MOUTH TWO  TIMES DAILY 180 tablet 2  . Multiple Vitamins-Minerals (PRESERVISION AREDS PO) Take 1 tablet by mouth 2 (two) times daily.      No current facility-administered medications for this visit.     Allergies:   Codeine phosphate, Demerol [meperidine], Meperidine hcl, Meperidine hcl, and Sulfa antibiotics    ROS:  Please see the history of present illness.   Otherwise, review of systems are positive for none.   All other systems are reviewed and negative.    PHYSICAL EXAM: VS:  BP (!) 152/72   Pulse 60   Ht 5\' 5"  (1.651 m)   Wt 111 lb 6.4 oz (50.5 kg)   BMI 18.54 kg/m  , BMI Body mass index is 18.54 kg/m. GENERAL:  Well appearing NECK:  No jugular venous distention, waveform within normal limits,  carotid upstroke brisk and symmetric, no bruits, no thyromegaly LUNGS:  Clear to auscultation bilaterally CHEST:  Unremarkable HEART:  PMI not displaced or sustained,S1 and S2 within normal limits, no S3, no S4, no clicks, no rubs, 3 out of 6 diastolic murmur heard at the third left intercostal space and ending late in systole, no diastolic murmurs ABD:  Flat, positive bowel sounds normal in frequency in pitch, no bruits, no rebound, no guarding, no midline pulsatile mass, no hepatomegaly, no splenomegaly EXT:  2 plus pulses throughout, no edema, no cyanosis no clubbing   EKG:  EKG is ordered today. The ekg ordered today demonstrates sinus rhythm, rate 60, axis within normal limits, intervals within normal limits, no acute ST-T wave changes.   Recent Labs: No results found for requested labs within last 8760 hours.    Lipid Panel    Component Value Date/Time   CHOL 226 (H) 05/17/2018 0912   TRIG 84.0 05/17/2018 0912   HDL 79.30 05/17/2018 0912   CHOLHDL 3 05/17/2018 0912   VLDL 16.8 05/17/2018 0912   LDLCALC 129 (H) 05/17/2018 0912   LDLDIRECT 131.0 09/03/2009 0813      Wt Readings from Last 3 Encounters:  10/06/19 111 lb 6.4 oz (50.5 kg)  04/08/19 110 lb (49.9 kg)  04/03/19 110 lb (49.9 kg)      Other studies Reviewed: Additional studies/ records that were reviewed today include: None. Review of the above records demonstrates:  Please see elsewhere in the note.     ASSESSMENT AND PLAN:   Aortic insufficiency -  This was still moderate on echo in October of last year.    I will check an echocardiogram in the summer.   Atrial fib-  [name] has a CHA2DS2 - VASc score of 2.  She tolerates anticoagulation.  She has had rare paroxysms.  No change in therapy.  I will be checking blood work today on this anticoagulation.  Check CBCs.  I will also check a TSH and basic metabolic profile and has not had blood work in a while.  HTN - The blood pressure is not at  target.  She is getting keep a blood pressure diary.  If in 2 weeks is elevated I will probably add chlorthalidone.    Covid 19 education - We talked about the vaccine.   Current medicines are reviewed at length with the patient today.  The patient does not have concerns regarding medicines.  The following changes have been made:  no change  Labs/ tests ordered today include:  None  Orders Placed This Encounter  Procedures  . CBC  . TSH  . Basic metabolic panel  . EKG 12-Lead  . ECHOCARDIOGRAM COMPLETE     Disposition:   FU with me after the echo.     Signed, [name] Breeding, [name]  10/06/2019 1:31 PM    [facility]

## 2019-10-06 ENCOUNTER — Other Ambulatory Visit: Payer: Self-pay

## 2019-10-06 ENCOUNTER — Encounter: Payer: Self-pay | Admitting: Cardiology

## 2019-10-06 ENCOUNTER — Ambulatory Visit: Payer: Medicare Other | Admitting: Cardiology

## 2019-10-06 VITALS — BP 152/72 | HR 60 | Ht 65.0 in | Wt 111.4 lb

## 2019-10-06 DIAGNOSIS — I10 Essential (primary) hypertension: Secondary | ICD-10-CM

## 2019-10-06 DIAGNOSIS — I482 Chronic atrial fibrillation, unspecified: Secondary | ICD-10-CM

## 2019-10-06 DIAGNOSIS — I351 Nonrheumatic aortic (valve) insufficiency: Secondary | ICD-10-CM

## 2019-10-06 DIAGNOSIS — Z7189 Other specified counseling: Secondary | ICD-10-CM

## 2019-10-06 NOTE — Patient Instructions (Addendum)
Medication Instructions:  Your physician recommends that you continue on your current medications as directed. Please refer to the Current Medication list given to you today.  *If you need a refill on your cardiac medications before your next appointment, please call your pharmacy*  Lab Work: Your physician recommends that you HAVE lab work TODAY:    COMPLETE BLOOD COUNT THYROID STIMULATING HORMONE BASIC METABOLIC PANEL  If you have labs (blood work) drawn today and your tests are completely normal, you will receive your results only by: [name] MyChart Message (if you have MyChart) OR . A paper copy in the mail If you have any lab test that is abnormal or we need to change your treatment, we will call you to review the results.  Testing/Procedures: Your physician has requested that you have an echocardiogram. Echocardiography is a painless test that uses sound waves to create images of your heart. It provides your doctor with information about the size and shape of your heart and how well your heart's chambers and valves are working. This procedure takes approximately one hour. There are no restrictions for this procedure. LOCATION: [facility]: [address], [place], [place] [address]  DUE July 2021    Follow-Up: At [facility], you and your health needs are our priority.  As part of our continuing mission to provide you with exceptional heart care, we have created designated Provider Care Teams.  These Care Teams include your primary Cardiologist (physician) and Advanced Practice Providers (APPs -  Physician Assistants and Nurse Practitioners) who all work together to provide you with the care you need, when you need it.  Your next appointment:   DUE AFTER YOUR ECHOCARDIOGRAM  The format for your next appointment:   IN-PERSON  Provider:   DR. Percival Spanish  Other Instructions KEEP A BLOOD PRESSURE DIARY FOR TWO WEEKS.

## 2019-10-07 LAB — CBC
Hematocrit: 41.8 % (ref 34.0–46.6)
Hemoglobin: 14 g/dL (ref 11.1–15.9)
MCH: 32.9 pg (ref 26.6–33.0)
MCHC: 33.5 g/dL (ref 31.5–35.7)
MCV: 98 fL — ABNORMAL HIGH (ref 79–97)
Platelets: 233 10*3/uL (ref 150–450)
RBC: 4.25 x10E6/uL (ref 3.77–5.28)
RDW: 11.6 % — ABNORMAL LOW (ref 11.7–15.4)
WBC: 6.7 10*3/uL (ref 3.4–10.8)

## 2019-10-07 LAB — BASIC METABOLIC PANEL
BUN/Creatinine Ratio: 20 (ref 12–28)
BUN: 17 mg/dL (ref 8–27)
CO2: 26 mmol/L (ref 20–29)
Calcium: 9.8 mg/dL (ref 8.7–10.3)
Chloride: 103 mmol/L (ref 96–106)
Creatinine, Ser: 0.83 mg/dL (ref 0.57–1.00)
GFR calc Af Amer: 85 mL/min/{1.73_m2} (ref >59)
GFR calc non Af Amer: 74 mL/min/{1.73_m2} (ref >59)
Glucose: 109 mg/dL — ABNORMAL HIGH (ref 65–99)
Potassium: 4 mmol/L (ref 3.5–5.2)
Sodium: 143 mmol/L (ref 134–144)

## 2019-10-07 LAB — TSH: TSH: 0.867 u[IU]/mL (ref 0.450–4.500)

## 2019-10-21 ENCOUNTER — Telehealth: Payer: Self-pay | Admitting: Cardiology

## 2019-10-21 DIAGNOSIS — H35372 Puckering of macula, left eye: Secondary | ICD-10-CM | POA: Diagnosis not present

## 2019-10-21 DIAGNOSIS — H35371 Puckering of macula, right eye: Secondary | ICD-10-CM | POA: Diagnosis not present

## 2019-10-21 DIAGNOSIS — H353132 Nonexudative age-related macular degeneration, bilateral, intermediate dry stage: Secondary | ICD-10-CM | POA: Diagnosis not present

## 2019-10-21 NOTE — Telephone Encounter (Signed)
New Message     [facility] Pre-operative Risk Assessment    Request for surgical clearance:  1. What type of surgery is being performed? Vitrectomy   2. When is this surgery scheduled? 10/29/19  3. What type of clearance is required (medical clearance vs. Pharmacy clearance to hold med vs. Both)? Both  4. Are there any medications that need to be held prior to surgery and how long? N/A  5. Practice name and name of physician performing surgery? Dr. Deloria Lair at [facility]   6. What is your office phone number [phone]   7.   What is your office fax number [phone]  8.   Anesthesia type (None, local, MAC, general) ? Local, MAC   Jenkins Rouge 10/21/2019, 4:09 PM  _________________________________________________________________   (provider comments below)

## 2019-10-22 NOTE — Telephone Encounter (Signed)
   Primary Cardiologist: Minus Breeding, MD  Chart reviewed as part of pre-operative protocol coverage. Given past medical history and time since last visit, based on ACC/AHA guidelines, [name] [name] would be at acceptable risk for the planned procedure without further cardiovascular testing.   I will route this recommendation to the requesting party via Epic fax function and remove from pre-op pool.  Please call with questions.  Daune Perch, NP 10/22/2019, 7:52 AM

## 2019-10-29 DIAGNOSIS — H35372 Puckering of macula, left eye: Secondary | ICD-10-CM | POA: Diagnosis not present

## 2019-11-06 DIAGNOSIS — H35372 Puckering of macula, left eye: Secondary | ICD-10-CM | POA: Diagnosis not present

## 2019-11-06 DIAGNOSIS — H35371 Puckering of macula, right eye: Secondary | ICD-10-CM | POA: Diagnosis not present

## 2019-11-12 ENCOUNTER — Other Ambulatory Visit: Payer: Medicare Other

## 2019-11-17 DIAGNOSIS — H35371 Puckering of macula, right eye: Secondary | ICD-10-CM | POA: Diagnosis not present

## 2019-11-17 DIAGNOSIS — H35372 Puckering of macula, left eye: Secondary | ICD-10-CM | POA: Diagnosis not present

## 2019-11-20 ENCOUNTER — Other Ambulatory Visit: Payer: Self-pay

## 2019-11-20 ENCOUNTER — Other Ambulatory Visit (INDEPENDENT_AMBULATORY_CARE_PROVIDER_SITE_OTHER): Payer: Medicare Other

## 2019-11-20 DIAGNOSIS — M810 Age-related osteoporosis without current pathological fracture: Secondary | ICD-10-CM | POA: Diagnosis not present

## 2019-11-20 DIAGNOSIS — E559 Vitamin D deficiency, unspecified: Secondary | ICD-10-CM | POA: Diagnosis not present

## 2019-11-20 LAB — VITAMIN D 25 HYDROXY (VIT D DEFICIENCY, FRACTURES): VITD: 57.64 ng/mL (ref 30.00–100.00)

## 2019-11-21 ENCOUNTER — Encounter: Payer: Self-pay | Admitting: Internal Medicine

## 2019-11-21 LAB — BASIC METABOLIC PANEL WITH GFR
BUN: 21 mg/dL (ref 7–25)
CO2: 30 mmol/L (ref 20–32)
Calcium: 9.6 mg/dL (ref 8.6–10.4)
Chloride: 105 mmol/L (ref 98–110)
Creat: 0.75 mg/dL (ref 0.50–0.99)
GFR, Est African American: 96 mL/min/{1.73_m2} (ref >=60)
GFR, Est Non African American: 82 mL/min/{1.73_m2} (ref >=60)
Glucose, Bld: 94 mg/dL (ref 65–99)
Potassium: 4.1 mmol/L (ref 3.5–5.3)
Sodium: 143 mmol/L (ref 135–146)

## 2019-11-27 ENCOUNTER — Ambulatory Visit: Payer: Medicare Other

## 2019-11-29 ENCOUNTER — Ambulatory Visit: Payer: Medicare Other

## 2019-12-03 ENCOUNTER — Other Ambulatory Visit: Payer: Medicare Other

## 2019-12-05 ENCOUNTER — Ambulatory Visit: Payer: Medicare Other | Attending: Internal Medicine

## 2019-12-05 DIAGNOSIS — Z23 Encounter for immunization: Secondary | ICD-10-CM

## 2019-12-05 NOTE — Progress Notes (Signed)
   Covid-19 Vaccination Clinic  Name:  SHAQUA GROSCH    MRN: [id number] DOB: 12-06-1950  12/05/2019  Ms. Vendetti was observed post Covid-19 immunization for 15 minutes without incidence. She was provided with Vaccine Information Sheet and instruction to access the V-Safe system.   Ms. Zarcone was instructed to call 911 with any severe reactions post vaccine: [name] Difficulty breathing  . Swelling of your face and throat  . A fast heartbeat  . A bad rash all over your body  . Dizziness and weakness    Immunizations Administered    Name Date Dose VIS Date Route   Pfizer COVID-19 Vaccine 12/05/2019  5:17 PM 0.3 mL 10/10/2019 Intramuscular   Manufacturer: [place]   Lot: [id number]   [place]: [id number]

## 2019-12-10 ENCOUNTER — Ambulatory Visit: Payer: Medicare Other

## 2019-12-18 ENCOUNTER — Ambulatory Visit: Payer: Medicare Other

## 2019-12-22 ENCOUNTER — Telehealth: Payer: Self-pay

## 2019-12-22 NOTE — Telephone Encounter (Signed)
Spoke with patient. Patient sent in BP logs for Dr. Percival Spanish to review. At this time no changes are to be made, patient verbalized understanding.

## 2019-12-29 DIAGNOSIS — H35372 Puckering of macula, left eye: Secondary | ICD-10-CM | POA: Diagnosis not present

## 2019-12-29 DIAGNOSIS — H35371 Puckering of macula, right eye: Secondary | ICD-10-CM | POA: Diagnosis not present

## 2019-12-29 DIAGNOSIS — H26492 Other secondary cataract, left eye: Secondary | ICD-10-CM | POA: Diagnosis not present

## 2019-12-30 ENCOUNTER — Ambulatory Visit: Payer: Medicare Other | Attending: Internal Medicine

## 2019-12-30 ENCOUNTER — Ambulatory Visit: Payer: Self-pay

## 2019-12-30 ENCOUNTER — Other Ambulatory Visit: Payer: Self-pay | Admitting: Cardiology

## 2019-12-30 DIAGNOSIS — Z23 Encounter for immunization: Secondary | ICD-10-CM | POA: Insufficient documentation

## 2019-12-30 NOTE — Progress Notes (Signed)
   Covid-19 Vaccination Clinic  Name:  MIKHALA MANRY    MRN: [id number] DOB: 08-01-1951  12/30/2019  Ms. Porta was observed post Covid-19 immunization for 15 minutes without incident. She was provided with Vaccine Information Sheet and instruction to access the V-Safe system.   Ms. Skluzacek was instructed to call 911 with any severe reactions post vaccine: [name] Difficulty breathing  . Swelling of face and throat  . A fast heartbeat  . A bad rash all over body  . Dizziness and weakness   Immunizations Administered    Name Date Dose VIS Date Route   Pfizer COVID-19 Vaccine 12/30/2019 11:56 AM 0.3 mL 10/10/2019 Intramuscular   Manufacturer: [place]   Lot: [id number]   [place]: [id number]

## 2020-01-12 ENCOUNTER — Telehealth: Payer: Self-pay | Admitting: Internal Medicine

## 2020-01-12 ENCOUNTER — Encounter: Payer: Self-pay | Admitting: Internal Medicine

## 2020-01-12 NOTE — Telephone Encounter (Signed)
[facility]-House call Nurse said she did a PAD screening and her R. Leg showed a moderate reading. Nurse is wanted her PCP to be aware and she is asymptotic-no symptoms  Adron Bene can be reached at [phone]

## 2020-01-12 NOTE — Telephone Encounter (Signed)
Left detailed message informing pt to wait at least 2-3 weeks before getting another vaccine.

## 2020-01-12 NOTE — Telephone Encounter (Signed)
Pt sent a my-chart request for the pneumonia vaccine.   Her message " I had my second dose of COVID-19 vaccine December 30, 2019. When would it be an appropriate time to get the pneumonia vaccine?"   Pt can be reached at [phone] reply to her through my-chart

## 2020-01-13 NOTE — Telephone Encounter (Signed)
FYI

## 2020-01-14 NOTE — Telephone Encounter (Signed)
Not sure what that test  indicates  If patient concerned she can female appt with me or her  Cardiologist

## 2020-01-14 NOTE — Telephone Encounter (Signed)
Left message for Nidahi to return phone call.

## 2020-01-15 NOTE — Telephone Encounter (Signed)
Patient notified of update  and verbalized understanding. PT scheduled to see PCP 01/16/2020. Pt advised is if sx worsen or persist to go to local ED or UC.

## 2020-01-15 NOTE — Telephone Encounter (Signed)
Spoke to [employer] and she stated that she sees pt 1 time a year. She advised that I call pt with update.   Called pt with update but no answer will call back.

## 2020-01-16 ENCOUNTER — Encounter: Payer: Self-pay | Admitting: Internal Medicine

## 2020-01-16 ENCOUNTER — Other Ambulatory Visit: Payer: Self-pay

## 2020-01-16 ENCOUNTER — Ambulatory Visit (INDEPENDENT_AMBULATORY_CARE_PROVIDER_SITE_OTHER): Payer: Medicare Other | Admitting: Internal Medicine

## 2020-01-16 VITALS — BP 140/80 | HR 58 | Temp 97.9°F | Ht 65.0 in | Wt 116.0 lb

## 2020-01-16 DIAGNOSIS — R6889 Other general symptoms and signs: Secondary | ICD-10-CM | POA: Diagnosis not present

## 2020-01-16 DIAGNOSIS — I10 Essential (primary) hypertension: Secondary | ICD-10-CM

## 2020-01-16 NOTE — Patient Instructions (Signed)
Will  Order  abi check of your right leg but gald you are not having any symptoms of  Poor circulation.   Get immuniz appt for  Pneumovax 23  When you want .    test done was a screening test   Nota definitve test

## 2020-01-16 NOTE — Progress Notes (Signed)
This visit occurred during the SARS-CoV-2 public health emergency.  Safety protocols were in place, including screening questions prior to the visit, additional usage of staff PPE, and extensive cleaning of exam room while observing appropriate contact time as indicated for disinfecting solutions.    Chief Complaint  Patient presents with  . Follow-up    abnormal screen    HPI: [name] [name] 69 y.o. come in for fu abn screening test done by [facility]   Right toe "quantaF;o" 0.73 right and.93   Left     She has no pain   Or claudication sx .  Has bilateral bunions but no numbness   Coming in to discuss  Gets some swelling veins ever since on anticoagulation but no bleeding  Other edema  To be checking bp readings cause labile and good often at home  And  Cards wants more info before considering adj in meds  She has not been checking recently  Working  40 hours still.   Due for pneumovax 23  Wants to wait  More time after COvid vaccine ROS: See pertinent positives and negatives per HPI.  Past Medical History:  Diagnosis Date  . Allergic rhinitis   . Aortic insufficiency   . CAT SCRATCH 09/10/2009   Qualifier: Diagnosis of  By: Regis Bill MD, [name]   . Clavicle fracture 3/08   left  . Diverticulosis   . HTN (hypertension)   . Osteopenia   . Paroxysmal atrial fibrillation (HCC)   . Vision disturbance 03/13/2013   Negative neuro MRA MRI    Family History  Problem Relation Age of Onset  . Scleroderma Mother   . Hyperlipidemia Mother   . Coronary artery disease Father 71  . Esophageal cancer Paternal Grandmother     Social History   Socioeconomic History  . Marital status: Married    Spouse name: Not on file  . Number of children: 0  . Years of education: Not on file  . Highest education level: Not on file  Occupational History  . Occupation: Veterinary surgeon  . Occupation: REPORTER    Employer: [employer]  Tobacco Use  . Smoking status: Never Smoker  . Smokeless  tobacco: Never Used  Substance and Sexual Activity  . Alcohol use: No    Comment: never  . Drug use: No  . Sexual activity: Not on file  Other Topics Concern  . Not on file  Social History Narrative   Married   hh of 2    p0g0   Works for [employer]    Social Determinants of Radio broadcast assistant Strain:   . Difficulty of Paying Living Expenses:   Food Insecurity:   . Worried About Charity fundraiser in the Last Year:   . Arboriculturist in the Last Year:   Transportation Needs:   . Film/video editor (Medical):   [name] Lack of Transportation (Non-Medical):   Physical Activity:   . Days of Exercise per Week:   . Minutes of Exercise per Session:   Stress:   . Feeling of Stress :   Social Connections:   . Frequency of Communication with Friends and Family:   . Frequency of Social Gatherings with Friends and Family:   . Attends Religious Services:   . Active Member of Clubs or Organizations:   . Attends Archivist Meetings:   [name] Marital Status:     Outpatient Medications Prior to Visit  Medication  Sig Dispense Refill  . alendronate (FOSAMAX) 70 MG tablet TAKE 1 TABLET ONCE A WEEK  WITH A FULL GLASS OF WATER  ON AN EMPTY STOMACH 12 tablet 3  . apixaban (ELIQUIS) 5 MG TABS tablet Take 1 tablet (5 mg total) by mouth 2 (two) times daily. 180 tablet 3  . Calcium Carbonate-Vitamin D (CALCIUM PLUS VITAMIN D PO) Take 1 tablet by mouth daily.     . Cholecalciferol (VITAMIN D3) 1000 units CAPS Take 2 capsules by mouth daily.     [name] diltiazem (CARDIZEM CD) 300 MG 24 hr capsule TAKE 1 CAPSULE BY MOUTH  DAILY 90 capsule 3  . flecainide (TAMBOCOR) 50 MG tablet TAKE 1 AND 1/2 TABLETS BY  MOUTH TWO TIMES DAILY (DOSE INCREASE) 270 tablet 3  . metoprolol tartrate (LOPRESSOR) 25 MG tablet TAKE 1 TABLET BY MOUTH  TWICE DAILY 180 tablet 2  . Multiple Vitamins-Minerals (PRESERVISION AREDS PO) Take 1 tablet by mouth 2 (two) times daily.     [name] acetaminophen (TYLENOL) 500 MG  tablet Take 500 mg by mouth as needed.    [name] ibuprofen (ADVIL,MOTRIN) 800 MG tablet Take 800 mg by mouth 2 (two) times daily as needed for moderate pain.     [name] LORazepam (ATIVAN) 0.5 MG tablet Take 1 tablet (0.5 mg total) by mouth 3 (three) times daily as needed for anxiety. For travel 20 tablet 0   No facility-administered medications prior to visit.     EXAM:  BP 140/80   Pulse (!) 58   Temp 97.9 F (36.6 C) (Other (Comment))   Ht 5\' 5"  (1.651 m)   Wt 116 lb (52.6 kg)   SpO2 98%   BMI 19.30 kg/m   Body mass index is 19.3 kg/m.  GENERAL: vitals reviewed and listed above, alert, oriented, appears well hydrated and in no acute distress HEENT: atraumatic, conjunctiva  clear, no obvious abnormalities on inspection of external nose and ears OP : masked NECK: no obvious masses on inspection palpation  LUNGS: clear to auscultation bilaterally, no wheezes, rales or rhonchi, good air movement CV: HRRR, no clubbing cyanosis some veins  superfical sig bunions  deformity   but nl color temp and cap refill no ulcers  Pulses felt  MS: moves all extremities  PSYCH: pleasant and cooperative, no obvious depression or anxiety Lab Results  Component Value Date   WBC 6.7 10/06/2019   HGB 14.0 10/06/2019   HCT 41.8 10/06/2019   PLT 233 10/06/2019   GLUCOSE 94 11/20/2019   CHOL 226 (H) 05/17/2018   TRIG 84.0 05/17/2018   HDL 79.30 05/17/2018   LDLDIRECT 131.0 09/03/2009   LDLCALC 129 (H) 05/17/2018   ALT 23 05/17/2018   AST 21 05/17/2018   NA 143 11/20/2019   K 4.1 11/20/2019   CL 105 11/20/2019   CREATININE 0.75 11/20/2019   BUN 21 11/20/2019   CO2 30 11/20/2019   TSH 0.867 10/06/2019   BP Readings from Last 3 Encounters:  01/16/20 140/80  10/06/19 (!) 152/72  04/08/19 122/78   Info revewi  to scan ASSESSMENT AND PLAN:  Discussed the following assessment and plan:  Abnormal finding on screening procedure - Plan: PCV LOWER ARTERIAL (BILATERAL)  Essential  hypertension Screening  Tests per [facility]  no sx  Go ahead and get vascular testing    Disc with patient She weill get back to checking bp readings   As planned and inform cards team  Risk benefit of meds  And want to avoid  hypotension Can get immuniz options of timing discussed  Pneumovax -Patient advised to return or notify health care team  if  new concerns arise. Return for depending on results, as indicated.  Patient Instructions  Will  Order  abi check of your right leg but gald you are not having any symptoms of  Poor circulation.   Get immuniz appt for  Pneumovax 23  When you want .    test done was a screening test   Nota definitve test     [name]. Junnie Loschiavo M.D.

## 2020-01-22 NOTE — Telephone Encounter (Signed)
Spoke to pt and she stated that she had pain in left foot. Pt stated that it may be the way she is walking or calluses on left foot. Pt believes she may need cushion in shoes. Pt notified that Dr.[name] is out of the office. Pt articulated that it can wait until next week. Pt declined appt. due to having one last week with Dr. Regis Bill. Pt claimed she has already spoken to Dr. Regis Bill about issue and thought an order will be placed.   Please advise

## 2020-01-29 ENCOUNTER — Other Ambulatory Visit: Payer: Self-pay

## 2020-01-31 ENCOUNTER — Other Ambulatory Visit: Payer: Self-pay | Admitting: Cardiology

## 2020-02-02 ENCOUNTER — Other Ambulatory Visit: Payer: Self-pay

## 2020-02-02 ENCOUNTER — Ambulatory Visit (INDEPENDENT_AMBULATORY_CARE_PROVIDER_SITE_OTHER): Payer: Medicare Other | Admitting: *Deleted

## 2020-02-02 DIAGNOSIS — Z23 Encounter for immunization: Secondary | ICD-10-CM

## 2020-02-02 NOTE — Progress Notes (Signed)
Per orders of Dr. Regis Bill injection of Pneumovax-23 given by Zacarias Pontes. Patient tolerated injection well.

## 2020-03-11 DIAGNOSIS — I4891 Unspecified atrial fibrillation: Secondary | ICD-10-CM | POA: Insufficient documentation

## 2020-03-11 DIAGNOSIS — E7800 Pure hypercholesterolemia, unspecified: Secondary | ICD-10-CM | POA: Insufficient documentation

## 2020-04-06 DIAGNOSIS — Z03818 Encounter for observation for suspected exposure to other biological agents ruled out: Secondary | ICD-10-CM | POA: Diagnosis not present

## 2020-04-21 DIAGNOSIS — Z03818 Encounter for observation for suspected exposure to other biological agents ruled out: Secondary | ICD-10-CM | POA: Diagnosis not present

## 2020-05-10 ENCOUNTER — Other Ambulatory Visit: Payer: Self-pay

## 2020-05-10 ENCOUNTER — Ambulatory Visit (HOSPITAL_COMMUNITY): Payer: Medicare Other | Attending: Internal Medicine

## 2020-05-10 DIAGNOSIS — I351 Nonrheumatic aortic (valve) insufficiency: Secondary | ICD-10-CM

## 2020-05-15 ENCOUNTER — Other Ambulatory Visit: Payer: Self-pay | Admitting: Cardiology

## 2020-05-30 DIAGNOSIS — Z7189 Other specified counseling: Secondary | ICD-10-CM | POA: Insufficient documentation

## 2020-05-30 NOTE — Progress Notes (Signed)
Cardiology Office Note   Date:  05/31/2020   ID:  [name] [name], DOB September 04, 1951, MRN [id number]  PCP:  Burnis Medin, MD  Cardiologist:   Minus Breeding, MD   Chief Complaint  Patient presents with  . Atrial Fibrillation      History of Present Illness: [name] [name] is a 69 y.o. female who for follow up of palpitations and aortic insufficiency.  She has paroxysmal atrial fibrillation. She has moderate AI.  I sent her for a POET(Plain Old Exercise Treadmill)  (in anticipation that she would need flecainide.)  and she had atrial fib during recovery.  She was cared for by Dr. Claiborne Billings and was given Cardizem.  She had return to [facility] later in the evening.   She does have an [employer] to track her HR .  She agreed to start flecainide and has been titrated to 75 mg bid but she did not want to increase to 100 mg despite the fact that she was still having some breakthrough palpitations.  She saw Dr. Rayann Heman and he discussed ablation.    Since I last saw her she is doing okay.  She had a couple of episodes of atrial fibrillation lasting a few hours.  However, the seem to be about the same as previous.  She had nothing sustained.  Her blood pressures have been well controlled.  The patient denies any new symptoms such as chest discomfort, neck or arm discomfort. There has been no new shortness of breath, PND or orthopnea. There has been no presyncope or syncope.   Past Medical History:  Diagnosis Date  . Allergic rhinitis   . Aortic insufficiency   . CAT SCRATCH 09/10/2009   Qualifier: Diagnosis of  By: Regis Bill MD, [name]   . Clavicle fracture 3/08   left  . Diverticulosis   . HTN (hypertension)   . Osteopenia   . Paroxysmal atrial fibrillation (HCC)   . Vision disturbance 03/13/2013   Negative neuro MRA MRI    Past Surgical History:  Procedure Laterality Date  . ABDOMINAL HYSTERECTOMY     pt denies 04/15/14  . APPENDECTOMY    . LAPAROSCOPIC SALPINGO OOPHERECTOMY Right   . TONSILLECTOMY  AND ADENOIDECTOMY       Current Outpatient Medications  Medication Sig Dispense Refill  . acetaminophen (TYLENOL) 500 MG tablet Take 500 mg by mouth as needed.    [name] alendronate (FOSAMAX) 70 MG tablet TAKE 1 TABLET ONCE A WEEK  WITH A FULL GLASS OF WATER  ON AN EMPTY STOMACH 12 tablet 3  . Calcium Carbonate-Vitamin D (CALCIUM PLUS VITAMIN D PO) Take 1 tablet by mouth daily.     . Cholecalciferol (VITAMIN D3) 1000 units CAPS Take 2 capsules by mouth daily.     [name] diltiazem (CARDIZEM CD) 300 MG 24 hr capsule TAKE 1 CAPSULE BY MOUTH  DAILY 90 capsule 3  . ELIQUIS 5 MG TABS tablet TAKE 1 TABLET BY MOUTH  TWICE DAILY 180 tablet 3  . flecainide (TAMBOCOR) 50 MG tablet TAKE 1 AND 1/2 TABLETS BY  MOUTH TWICE DAILY 270 tablet 3  . LORazepam (ATIVAN) 0.5 MG tablet Take 1 tablet by mouth as needed.    . metoprolol tartrate (LOPRESSOR) 25 MG tablet TAKE 1 TABLET BY MOUTH  TWICE DAILY 180 tablet 2  . Multiple Vitamins-Minerals (PRESERVISION AREDS PO) Take 1 tablet by mouth 2 (two) times daily.      No current facility-administered medications for this visit.  Allergies:   Codeine phosphate, Demerol [meperidine], Meperidine hcl, Meperidine hcl, and Sulfa antibiotics    ROS:  Please see the history of present illness.   Otherwise, review of systems are positive for none.   All other systems are reviewed and negative.    PHYSICAL EXAM: VS:  BP (!) 136/70 (BP Location: Left Arm, Patient Position: Sitting, Cuff Size: Normal)   Pulse 58   Ht 5\' 5"  (1.651 m)   Wt 110 lb 12.8 oz (50.3 kg)   SpO2 97%   BMI 18.44 kg/m  , BMI Body mass index is 18.44 kg/m. GENERAL:  Well appearing NECK:  No jugular venous distention, waveform within normal limits, carotid upstroke brisk and symmetric, no bruits, no thyromegaly LUNGS:  Clear to auscultation bilaterally CHEST:  Unremarkable HEART:  PMI not displaced or sustained,S1 and S2 within normal limits, no S3, no S4, no clicks, no rubs, 3 out of 6 apical  diastolic murmur heard at the third left intercostal space and ending in late systole, no systolic murmurs ABD:  Flat, positive bowel sounds normal in frequency in pitch, no bruits, no rebound, no guarding, no midline pulsatile mass, no hepatomegaly, no splenomegaly EXT:  2 plus pulses throughout, no edema, no cyanosis no clubbing   EKG:  EKG is not ordered today.    Recent Labs: 10/06/2019: Hemoglobin 14.0; Platelets 233; TSH 0.867 11/20/2019: BUN 21; Creat 0.75; Potassium 4.1; Sodium 143    Lipid Panel    Component Value Date/Time   CHOL 226 (H) 05/17/2018 0912   TRIG 84.0 05/17/2018 0912   HDL 79.30 05/17/2018 0912   CHOLHDL 3 05/17/2018 0912   VLDL 16.8 05/17/2018 0912   LDLCALC 129 (H) 05/17/2018 0912   LDLDIRECT 131.0 09/03/2009 0813      Wt Readings from Last 3 Encounters:  05/31/20 110 lb 12.8 oz (50.3 kg)  01/16/20 116 lb (52.6 kg)  10/06/19 111 lb 6.4 oz (50.5 kg)      Other studies Reviewed: Additional studies/ records that were reviewed today include: None. Review of the above records demonstrates:  Please see elsewhere in the note.     ASSESSMENT AND PLAN:   Aortic insufficiency -  This was still moderate on echo in July.  No change in therapy or further testing at this time.  I will consider repeat echo in a couple of years.   She also has some mild aortic enlargement which I will follow up at that time.  Atrial fib-  [name] [name] has a CHA2DS2 - VASc score of 2.  She tolerates anticoagulation.  I will check a CBC today.  She is not interested in further change to her medications or ablation and she thinks her paroxysms are infrequent and stable.   HTN - The blood pressure was at target on a recent BP diary.  No change in therapy.  Covid 19 education - She has been vaccinated.   Current medicines are reviewed at length with the patient today.  The patient does not have concerns regarding medicines.  The following changes have been made:  no  change  Labs/ tests ordered today include:   Orders Placed This Encounter  Procedures  . CBC     Disposition:   FU with me in 12 months.    Signed, Minus Breeding, MD  05/31/2020 10:02 AM    [place]

## 2020-05-31 ENCOUNTER — Encounter: Payer: Self-pay | Admitting: Cardiology

## 2020-05-31 ENCOUNTER — Ambulatory Visit: Payer: Medicare Other | Admitting: Cardiology

## 2020-05-31 ENCOUNTER — Other Ambulatory Visit: Payer: Self-pay

## 2020-05-31 VITALS — BP 136/70 | HR 58 | Ht 65.0 in | Wt 110.8 lb

## 2020-05-31 DIAGNOSIS — I10 Essential (primary) hypertension: Secondary | ICD-10-CM | POA: Diagnosis not present

## 2020-05-31 DIAGNOSIS — I351 Nonrheumatic aortic (valve) insufficiency: Secondary | ICD-10-CM

## 2020-05-31 DIAGNOSIS — Z7189 Other specified counseling: Secondary | ICD-10-CM | POA: Diagnosis not present

## 2020-05-31 DIAGNOSIS — I480 Paroxysmal atrial fibrillation: Secondary | ICD-10-CM

## 2020-05-31 LAB — CBC
Hematocrit: 41.9 % (ref 34.0–46.6)
Hemoglobin: 14.4 g/dL (ref 11.1–15.9)
MCH: 33.3 pg — ABNORMAL HIGH (ref 26.6–33.0)
MCHC: 34.4 g/dL (ref 31.5–35.7)
MCV: 97 fL (ref 79–97)
Platelets: 231 10*3/uL (ref 150–450)
RBC: 4.33 x10E6/uL (ref 3.77–5.28)
RDW: 11.2 % — ABNORMAL LOW (ref 11.7–15.4)
WBC: 6 10*3/uL (ref 3.4–10.8)

## 2020-05-31 NOTE — Patient Instructions (Signed)
Medication Instructions:  Your physician recommends that you continue on your current medications as directed. Please refer to the Current Medication list given to you today.  *If you need a refill on your cardiac medications before your next appointment, please call your pharmacy*  Lab Work: Your physician recommends that you return for lab work TODAY:   CBC  If you have labs (blood work) drawn today and your tests are completely normal, you will receive your results only by: [name] MyChart Message (if you have MyChart) OR . A paper copy in the mail If you have any lab test that is abnormal or we need to change your treatment, we will call you to review the results.  Testing/Procedures: NONE ordered at this time of appointment   Follow-Up: At [facility], you and your health needs are our priority.  As part of our continuing mission to provide you with exceptional heart care, we have created designated Provider Care Teams.  These Care Teams include your primary Cardiologist (physician) and Advanced Practice Providers (APPs -  Physician Assistants and Nurse Practitioners) who all work together to provide you with the care you need, when you need it.  Your next appointment:   1 year(s)  The format for your next appointment:   In Person  Provider:   You may see Minus Breeding, MD or one of the following Advanced Practice Providers on your designated Care Team:    Rosaria Ferries, PA-C  Jory Sims, DNP, ANP  Cadence Kathlen Mody, PA-C  Other Instructions

## 2020-06-23 ENCOUNTER — Telehealth: Payer: Self-pay

## 2020-06-23 NOTE — Telephone Encounter (Signed)
Pt called in wanting to know if they can take tylenol for pain? Or would it aggravate afib? Will route to pharmd pool

## 2020-07-09 NOTE — Telephone Encounter (Signed)
I f you  Have been immunized   I don't see a need for testing unless  You get a symptom or close known high risk exposures  .  You can avoid being around  [facility] who are high risk for complications   For 3-5 days if needed.  Testing  appts are  Backed up because of so many  People testing  For good reason.

## 2020-07-14 ENCOUNTER — Other Ambulatory Visit: Payer: Self-pay

## 2020-07-14 ENCOUNTER — Ambulatory Visit: Payer: Self-pay

## 2020-07-14 ENCOUNTER — Ambulatory Visit: Payer: Medicare Other | Admitting: Orthopaedic Surgery

## 2020-07-14 ENCOUNTER — Encounter: Payer: Self-pay | Admitting: Orthopaedic Surgery

## 2020-07-14 VITALS — Ht 65.0 in | Wt 110.0 lb

## 2020-07-14 DIAGNOSIS — G8929 Other chronic pain: Secondary | ICD-10-CM | POA: Diagnosis not present

## 2020-07-14 DIAGNOSIS — M79605 Pain in left leg: Secondary | ICD-10-CM | POA: Insufficient documentation

## 2020-07-14 DIAGNOSIS — M545 Low back pain, unspecified: Secondary | ICD-10-CM | POA: Insufficient documentation

## 2020-07-14 DIAGNOSIS — M5442 Lumbago with sciatica, left side: Secondary | ICD-10-CM

## 2020-07-14 NOTE — Progress Notes (Signed)
Office Visit Note   Patient: [name] [name]           Date of Birth: 10-18-1951           MRN: [id number] Visit Date: 07/14/2020              Requested by: Burnis Medin, MD [place],  [place] [address] PCP: Burnis Medin, MD   Assessment & Plan: Visit Diagnoses:  1. Chronic bilateral low back pain with left-sided sciatica   2. Pain in left leg     Plan: [name] [name] has been experiencing anterior and lateral left leg pain for several weeks.  No injury or trauma.  She does not specifically complain of knee pain.  She has had some chronic issues with her lumbar spine with back and bilateral buttock pain but no obvious radiculopathy other than the left leg pain which may or may not be related.  She does have a history of atrial fibrillation and is on Eliquis.  She has some venous stasis changes in both lower extremities with mild left ankle swelling that apparently has been chronic and preceded her recent shin pain.  No specific calf discomfort.  Neurologically intact. Long discussion over about 45 minutes regarding all of the above.  I cannot specifically determine the cause of her left leg pain.  It could be related to her lumbar spine or possibly something in the left tibia.  She is only had discomfort about 2 weeks.  I had like to see how she does over the next 2 weeks and if still having a problem I start with a CT scan of the left leg and, then, consider MRI of the lumbar spine as this may be radiculopathy.  Reevaluate in 2 weeks  Follow-Up Instructions: No follow-ups on file.   Orders:  Orders Placed This Encounter  Procedures  . XR Lumbar Spine 2-3 Views  . XR Tibia/Fibula Left   No orders of the defined types were placed in this encounter.     Procedures: No procedures performed   Clinical Data: No additional findings.   Subjective: Chief Complaint  Patient presents with  . Left Leg - Pain  Patient presents today for left lower leg pain. She  started having pain two weeks ago. No known injury. Her pain is located in her shin and lateral leg. Swells. She states that her pain is worse in the morning, and only occurs with standing in one place. No pain with walking, sitting, or lying down. She does not take anything for pain.  Has a history of atrial fibrillation is on Eliquis.  Has been told she has some "circulation problems" with her lower extremities and has mild edema over the left more than her right ankle as the day progresses.  She notes that nothing is bad enough to take any medicine to wear compression stockings.  Has a history of right knee pain with good response to cortisone but not having any specific left knee pain.  Not had any foot or ankle discomfort other than the swelling.  Not experiencing any groin or thigh pain.  Does sit during the day at her job but notes that she has had some issues with her back and buttock. [name] [name] relates that her "shin" hurts with standing and usually worse in the morning.  There is no pain with walking or sitting or even lying down.  She is not taking any medicines.  She has not had  any injury or trauma.  HPI  Review of Systems   Objective: Vital Signs: Ht 5\' 5"  (1.651 m)   Wt 110 lb (49.9 kg)   BMI 18.30 kg/m   Physical Exam Constitutional:      Appearance: She is well-developed.  Eyes:     Pupils: Pupils are equal, round, and reactive to light.  Pulmonary:     Effort: Pulmonary effort is normal.  Skin:    General: Skin is warm and dry.  Neurological:     Mental Status: She is alert and oriented to person, place, and time.  Psychiatric:        Behavior: Behavior normal.     Ortho Exam awake alert and oriented x3.  Comfortable sitting.  No acute distress.  Presently not having any pain except very minimal percussible tenderness over the mid tibia. very mild nonpitting edema of her left ankle.  Some venous stasis changes of both right and left lower legs.  No swelling of either  feet.  Neurologically intact.  Good pulses bilaterally.  Bunions bilaterally without pain.  Straight leg raise negative.  Painless range of motion both hips.  No related thigh pain.  Very mild percussible tenderness of the tibia diffusely but no skin changes.  No effusion or pain referable to the left knee.  No instability in full extension and over 100 degrees of flexion.  No popliteal pain.  No calf pain.  No popliteal mass  Specialty Comments:  No specialty comments available.  Imaging: No results found.   PMFS History: Patient Active Problem List   Diagnosis Date Noted  . Low back pain 07/14/2020  . Pain in left leg 07/14/2020  . Educated about COVID-19 virus infection 05/30/2020  . Chronic pain of right knee 03/31/2019  . Essential hypertension 06/30/2017  . Age related osteoporosis 10/19/2016  . Paroxysmal atrial fibrillation ([place]) 09/27/2015  . Hemorrhage of rectum and anus 04/16/2014  . Special screening for malignant neoplasms, colon 04/16/2014  . Hemorrhoids, unspecified hemorrhoid type 04/16/2014  . Plantar wart of left foot 05/14/2013  . Foot callus 05/14/2013  . Bilateral bunions 03/13/2013  . Hypertrophic toenail 03/13/2013  . Vision disturbance 03/13/2013  . Post-nasal drainage 05/05/2012  . Aortic insufficiency 01/16/2012  . Macular degeneration 10/12/2011  . ANXIETY, SITUATIONAL 05/08/2008  . Vitamin D insufficiency 05/22/2007  . ELEVATED BLOOD PRESSURE WITHOUT DIAGNOSIS OF HYPERTENSION 05/22/2007  . Allergic rhinitis due to allergen 03/28/2007   Past Medical History:  Diagnosis Date  . Allergic rhinitis   . Aortic insufficiency   . CAT SCRATCH 09/10/2009   Qualifier: Diagnosis of  By: Regis Bill MD, [name]   . Clavicle fracture 3/08   left  . Diverticulosis   . HTN (hypertension)   . Osteopenia   . Paroxysmal atrial fibrillation (HCC)   . Vision disturbance 03/13/2013   Negative neuro MRA MRI    Family History  Problem Relation Age of Onset  .  Scleroderma Mother   . Hyperlipidemia Mother   . Coronary artery disease Father 37  . Esophageal cancer Paternal Grandmother     Past Surgical History:  Procedure Laterality Date  . ABDOMINAL HYSTERECTOMY     pt denies 04/15/14  . APPENDECTOMY    . LAPAROSCOPIC SALPINGO OOPHERECTOMY Right   . TONSILLECTOMY AND ADENOIDECTOMY     Social History   Occupational History  . Occupation: Veterinary surgeon  . Occupation: REPORTER    Employer: [employer]  Tobacco Use  . Smoking status: Never  Smoker  . Smokeless tobacco: Never Used  Substance and Sexual Activity  . Alcohol use: No    Comment: never  . Drug use: No  . Sexual activity: Not on file

## 2020-07-16 DIAGNOSIS — Z1231 Encounter for screening mammogram for malignant neoplasm of breast: Secondary | ICD-10-CM | POA: Diagnosis not present

## 2020-07-16 LAB — HM MAMMOGRAPHY

## 2020-07-19 ENCOUNTER — Ambulatory Visit (INDEPENDENT_AMBULATORY_CARE_PROVIDER_SITE_OTHER): Payer: Medicare Other | Admitting: Ophthalmology

## 2020-07-19 ENCOUNTER — Encounter (INDEPENDENT_AMBULATORY_CARE_PROVIDER_SITE_OTHER): Payer: Self-pay | Admitting: Ophthalmology

## 2020-07-19 ENCOUNTER — Other Ambulatory Visit: Payer: Self-pay

## 2020-07-19 DIAGNOSIS — H26492 Other secondary cataract, left eye: Secondary | ICD-10-CM | POA: Insufficient documentation

## 2020-07-19 DIAGNOSIS — H35372 Puckering of macula, left eye: Secondary | ICD-10-CM | POA: Diagnosis not present

## 2020-07-19 DIAGNOSIS — H35371 Puckering of macula, right eye: Secondary | ICD-10-CM

## 2020-07-19 NOTE — Patient Instructions (Signed)
New onset visual acuity distortions or changes

## 2020-07-19 NOTE — Assessment & Plan Note (Signed)
IMProved foveal thickness status post vitrectomy and ILM peel.  Acuity is improving

## 2020-07-19 NOTE — Telephone Encounter (Signed)
Thank you :)

## 2020-07-19 NOTE — Progress Notes (Signed)
07/19/2020     CHIEF COMPLAINT Patient presents for Retina Follow Up   HISTORY OF PRESENT ILLNESS: [name] [name] is a 69 y.o. female who presents to the clinic today for:   HPI    Retina Follow Up    Diagnosis: ERM.  In both eyes.  Severity is moderate.  Duration of 6 months.  Since onset it is stable.  I, the attending physician,  performed the HPI with the patient and updated documentation appropriately.          Comments    6 Month ERM f\u OU. OCT  Pt states NVA has decreased. Pt states DVA is doing well. Denies FOL and floaters.       Last edited by Tilda Franco on 07/19/2020  3:01 PM. (History)      Referring physician: Burnis Medin, MD [place],  [place] [id number]  HISTORICAL INFORMATION:   Selected notes from the MEDICAL RECORD NUMBER       CURRENT MEDICATIONS: No current outpatient medications on file. (Ophthalmic Drugs)   No current facility-administered medications for this visit. (Ophthalmic Drugs)   Current Outpatient Medications (Other)  Medication Sig  . acetaminophen (TYLENOL) 500 MG tablet Take 500 mg by mouth as needed.  [name] alendronate (FOSAMAX) 70 MG tablet TAKE 1 TABLET ONCE A WEEK  WITH A FULL GLASS OF WATER  ON AN EMPTY STOMACH  . Calcium Carbonate-Vitamin D (CALCIUM PLUS VITAMIN D PO) Take 1 tablet by mouth daily.   . Cholecalciferol (VITAMIN D3) 1000 units CAPS Take 2 capsules by mouth daily.   [name] diltiazem (CARDIZEM CD) 300 MG 24 hr capsule TAKE 1 CAPSULE BY MOUTH  DAILY  . ELIQUIS 5 MG TABS tablet TAKE 1 TABLET BY MOUTH  TWICE DAILY  . flecainide (TAMBOCOR) 50 MG tablet TAKE 1 AND 1/2 TABLETS BY  MOUTH TWICE DAILY  . LORazepam (ATIVAN) 0.5 MG tablet Take 1 tablet by mouth as needed.  . metoprolol tartrate (LOPRESSOR) 25 MG tablet TAKE 1 TABLET BY MOUTH  TWICE DAILY  . Multiple Vitamins-Minerals (PRESERVISION AREDS PO) Take 1 tablet by mouth 2 (two) times daily.    No current facility-administered medications for  this visit. (Other)      REVIEW OF SYSTEMS:    ALLERGIES Allergies  Allergen Reactions  . Codeine Phosphate     drowsy  . Demerol [Meperidine] Nausea Only  . Meperidine Hcl Nausea Only  . Meperidine Hcl   . Sulfa Antibiotics     Rash    PAST MEDICAL HISTORY Past Medical History:  Diagnosis Date  . Allergic rhinitis   . Aortic insufficiency   . CAT SCRATCH 09/10/2009   Qualifier: Diagnosis of  By: Regis Bill MD, [name]   . Clavicle fracture 3/08   left  . Diverticulosis   . HTN (hypertension)   . Osteopenia   . Paroxysmal atrial fibrillation (HCC)   . Vision disturbance 03/13/2013   Negative neuro MRA MRI   Past Surgical History:  Procedure Laterality Date  . ABDOMINAL HYSTERECTOMY     pt denies 04/15/14  . APPENDECTOMY    . LAPAROSCOPIC SALPINGO OOPHERECTOMY Right   . TONSILLECTOMY AND ADENOIDECTOMY      FAMILY HISTORY Family History  Problem Relation Age of Onset  . Scleroderma Mother   . Hyperlipidemia Mother   . Coronary artery disease Father 20  . Esophageal cancer Paternal Grandmother     SOCIAL HISTORY Social History   Tobacco Use  .  Smoking status: Never Smoker  . Smokeless tobacco: Never Used  Substance Use Topics  . Alcohol use: No    Comment: never  . Drug use: No         OPHTHALMIC EXAM:  Base Eye Exam    Visual Acuity (Snellen - Linear)      Right Left   Dist cc 20/25 20/40 +   Dist ph cc  NI       Tonometry (Tonopen, 3:09 PM)      Right Left   Pressure 10 9       Pupils      Pupils Dark Light Shape React APD   Right PERRL 4 3 Round Slow None   Left PERRL 4 3 Round Slow None       Visual Fields (Counting fingers)      Left Right    Full Full       Neuro/Psych    Oriented x3: Yes   Mood/Affect: Normal       Dilation    Both eyes: 1.0% Mydriacyl, 2.5% Phenylephrine @ 3:09 PM        Slit Lamp and Fundus Exam    External Exam      Right Left   External Normal Normal       Slit Lamp Exam      Right Left     Lids/Lashes Normal Normal   Conjunctiva/Sclera White and quiet White and quiet   Cornea Clear Clear   Anterior Chamber Deep and quiet Deep and quiet   Iris Round and reactive Round and reactive   Lens Centered posterior chamber intraocular lens Centered posterior chamber intraocular lens   Anterior Vitreous Normal Normal       Fundus Exam      Right Left   Posterior Vitreous Posterior vitreous detachment Clear vitrectomized   Disc Normal Normal   C/D Ratio 0.0 0.0   Macula Minor clinical epiretinal membrane with no  topographic distortion, Hard drusen, no exudates, no macular thickening Normal, small macular atrophy temporally   Vessels Normal Normal   Periphery Normal Normal          IMAGING AND PROCEDURES  Imaging and Procedures for 07/19/20  OCT, Retina - OU - Both Eyes       Right Eye Quality was good. Scan locations included subfoveal. Central Foveal Thickness: 328. Progression has been stable. Findings include epiretinal membrane, retinal drusen .   Left Eye Quality was good. Scan locations included subfoveal. Central Foveal Thickness: 416. Progression has improved.   Notes OD with nondistorting epiretinal membrane with minor thickening perifoveal but none in the fovea.  Moderate to intermediate age-related macular degeneration right eye with no signs of CNVM.  OS status post vitrectomy and ILM peel, much less center involved retinal thickening, though still not normal.                ASSESSMENT/PLAN:  Right epiretinal membrane The nature of macular pucker (epiretinal membrane ERM) was discussed with the patient as well as threshold criteria for vitrectomy surgery. I explained that in rare cases another surgery is needed to actually remove a second wrinkle should it regrow.  Most often, the epiretinal membrane and underlying wrinkled internal limiting membrane are removed with the first surgery, to accomplish the goals.   If the operative eye is Phakic  (natural lens still present), cataract surgery is often recommended prior to Vitrectomy. This will enable the retina surgeon to have the best view during surgery  and the patient to obtain optimal results in the future. Treatment options were discussed.  OD minor without topographic distortion  Left epiretinal membrane IMProved foveal thickness status post vitrectomy and ILM peel.  Acuity is improving      ICD-10-CM   1. Posterior capsular opacification non visually significant of left eye  H26.492   2. Left epiretinal membrane  H35.372 OCT, Retina - OU - Both Eyes  3. Right epiretinal membrane  H35.371 OCT, Retina - OU - Both Eyes    1.  2.  3.  Ophthalmic Meds Ordered this visit:  No orders of the defined types were placed in this encounter.      Return in about 1 year (around 07/19/2021) for DILATE OU, OCT.  Patient Instructions  New onset visual acuity distortions or changes    Explained the diagnoses, plan, and follow up with the patient and they expressed understanding.  Patient expressed understanding of the importance of proper follow up care.   Clent Demark Gretel Cantu M.D. Diseases & Surgery of the Retina and Vitreous [facility] 07/19/20     Abbreviations: M myopia (nearsighted); A astigmatism; H hyperopia (farsighted); P presbyopia; Mrx spectacle prescription;  CTL contact lenses; OD right eye; OS left eye; OU both eyes  XT exotropia; ET esotropia; PEK punctate epithelial keratitis; PEE punctate epithelial erosions; DES dry eye syndrome; MGD meibomian gland dysfunction; ATs artificial tears; PFAT's preservative free artificial tears; [place] nuclear sclerotic cataract; PSC posterior subcapsular cataract; ERM epi-retinal membrane; PVD posterior vitreous detachment; RD retinal detachment; DM diabetes mellitus; DR diabetic retinopathy; NPDR non-proliferative diabetic retinopathy; PDR proliferative diabetic retinopathy; CSME clinically significant macular edema; DME  diabetic macular edema; dbh dot blot hemorrhages; CWS cotton wool spot; POAG primary open angle glaucoma; C/D cup-to-disc ratio; HVF humphrey visual field; GVF goldmann visual field; OCT optical coherence tomography; IOP intraocular pressure; BRVO Branch retinal vein occlusion; CRVO central retinal vein occlusion; CRAO central retinal artery occlusion; BRAO branch retinal artery occlusion; RT retinal tear; SB scleral buckle; PPV pars plana vitrectomy; VH Vitreous hemorrhage; PRP panretinal laser photocoagulation; IVK intravitreal kenalog; VMT vitreomacular traction; MH Macular hole;  NVD neovascularization of the disc; NVE neovascularization elsewhere; AREDS age related eye disease study; ARMD age related macular degeneration; POAG primary open angle glaucoma; EBMD epithelial/anterior basement membrane dystrophy; ACIOL anterior chamber intraocular lens; IOL intraocular lens; PCIOL posterior chamber intraocular lens; Phaco/IOL phacoemulsification with intraocular lens placement; Between photorefractive keratectomy; LASIK laser assisted in situ keratomileusis; HTN hypertension; DM diabetes mellitus; COPD chronic obstructive pulmonary disease

## 2020-07-19 NOTE — Assessment & Plan Note (Signed)
The nature of macular pucker (epiretinal membrane ERM) was discussed with the patient as well as threshold criteria for vitrectomy surgery. I explained that in rare cases another surgery is needed to actually remove a second wrinkle should it regrow.  Most often, the epiretinal membrane and underlying wrinkled internal limiting membrane are removed with the first surgery, to accomplish the goals.   If the operative eye is Phakic (natural lens still present), cataract surgery is often recommended prior to Vitrectomy. This will enable the retina surgeon to have the best view during surgery and the patient to obtain optimal results in the future. Treatment options were discussed.  OD minor without topographic distortion

## 2020-07-23 ENCOUNTER — Ambulatory Visit: Payer: Medicare Other | Admitting: Physician Assistant

## 2020-07-23 ENCOUNTER — Other Ambulatory Visit: Payer: Self-pay

## 2020-07-23 ENCOUNTER — Encounter: Payer: Self-pay | Admitting: Physician Assistant

## 2020-07-23 VITALS — BP 150/70 | HR 53 | Ht 65.0 in | Wt 111.8 lb

## 2020-07-23 DIAGNOSIS — I712 Thoracic aortic aneurysm, without rupture, unspecified: Secondary | ICD-10-CM

## 2020-07-23 DIAGNOSIS — I480 Paroxysmal atrial fibrillation: Secondary | ICD-10-CM | POA: Diagnosis not present

## 2020-07-23 DIAGNOSIS — I10 Essential (primary) hypertension: Secondary | ICD-10-CM | POA: Diagnosis not present

## 2020-07-23 DIAGNOSIS — I351 Nonrheumatic aortic (valve) insufficiency: Secondary | ICD-10-CM | POA: Diagnosis not present

## 2020-07-23 MED ORDER — FLECAINIDE ACETATE 100 MG PO TABS: 100.0000 mg | ORAL_TABLET | Freq: Two times a day (BID) | ORAL | 3 refills | Status: DC

## 2020-07-23 NOTE — Patient Instructions (Signed)
Medication Instructions:  Increase Flecainide to 100 mg twice a day.  *If you need a refill on your cardiac medications before your next appointment, please call your pharmacy*   Lab Work: None ordered   Testing/Procedures: None ordered    Follow-Up: At [facility], you and your health needs are our priority.  As part of our continuing mission to provide you with exceptional heart care, we have created designated Provider Care Teams.  These Care Teams include your primary Cardiologist (physician) and Advanced Practice Providers (APPs -  Physician Assistants and Nurse Practitioners) who all work together to provide you with the care you need, when you need it.  We recommend signing up for the patient portal called "MyChart".  Sign up information is provided on this After Visit Summary.  MyChart is used to connect with patients for Virtual Visits (Telemedicine).  Patients are able to view lab/test results, encounter notes, upcoming appointments, etc.  Non-urgent messages can be sent to your provider as well.   To learn more about what you can do with MyChart, go to [email or web address].    Your next appointment:   August 02, 2020 at 4:15 pm  The format for your next appointment:   In Person  Provider:   Almyra Deforest, [place]

## 2020-07-23 NOTE — Progress Notes (Signed)
Cardiology Office Note:    Date:  07/25/2020   ID:  [name] [name], DOB Jun 10, 1951, MRN [id number]  PCP:  Burnis Medin, MD  [facility] Cardiologist:  Minus Breeding, MD  [place] Electrophysiologist:  None   Referring MD: Burnis Medin, MD   Chief Complaint  Patient presents with  . Follow-up    seen for Dr. Percival Spanish    History of Present Illness:    [name] [name] is a 69 y.o. female with a hx of aortic insufficiency, hypertension and PAF.  She was diagnosed with atrial fibrillation by event monitor in September 2016.  She is on metoprolol, diltiazem and flecainide for atrial fibrillation.  Previous ETT obtained on 10/17/2017 did not show any QT prolongation or ventricular ectopy on flecainide.  Echocardiogram obtained on 05/10/2020 showed EF 55 to 60%, grade 1 DD, mild MR, moderate AI, dilated ascending aorta measuring at 41 mm.  She was last seen by Dr. Percival Spanish on 8/2/1 2021, despite having occasional breakthrough A. fib, she did not wish to increase her flecainide at the time.  Since the last office visit, she has had several episodes of recurrent atrial fibrillation.  She does have cardiac awareness of palpitation.  Looking at her home blood pressure readings, her systolic blood pressure is typically in the upper 90s to 110s range.  I am only able to increase the rate controlling agent at this point.  We discussed 3 different options including increase the flecainide, or try a stronger antiarrhythmic medication such as an amiodarone and Tikosyn, or the last option would be to refer the patient back to Dr. Rayann Heman to consider EP study with ablation.  Her last appointment with Dr. Rayann Heman was back in 2019.  At this time, we opted to increase the flecainide to 100 mg twice a day for the time being.  I plan to see the patient back in 2 to 3 weeks with repeat EKG.  Past Medical History:  Diagnosis Date  . Allergic rhinitis   . Aortic insufficiency   . CAT SCRATCH 09/10/2009    Qualifier: Diagnosis of  By: Regis Bill MD, [name]   . Clavicle fracture 3/08   left  . Diverticulosis   . HTN (hypertension)   . Osteopenia   . Paroxysmal atrial fibrillation (HCC)   . Vision disturbance 03/13/2013   Negative neuro MRA MRI    Past Surgical History:  Procedure Laterality Date  . ABDOMINAL HYSTERECTOMY     pt denies 04/15/14  . APPENDECTOMY    . LAPAROSCOPIC SALPINGO OOPHERECTOMY Right   . TONSILLECTOMY AND ADENOIDECTOMY      Current Medications: Current Meds  Medication Sig  . acetaminophen (TYLENOL) 500 MG tablet Take 500 mg by mouth as needed.  [name] alendronate (FOSAMAX) 70 MG tablet TAKE 1 TABLET ONCE A WEEK  WITH A FULL GLASS OF WATER  ON AN EMPTY STOMACH  . Calcium Carbonate-Vitamin D (CALCIUM PLUS VITAMIN D PO) Take 1 tablet by mouth daily.   . Cholecalciferol (VITAMIN D3) 1000 units CAPS Take 2 capsules by mouth daily.   [name] diltiazem (CARDIZEM CD) 300 MG 24 hr capsule TAKE 1 CAPSULE BY MOUTH  DAILY  . ELIQUIS 5 MG TABS tablet TAKE 1 TABLET BY MOUTH  TWICE DAILY  . LORazepam (ATIVAN) 0.5 MG tablet Take 1 tablet by mouth as needed.  . metoprolol tartrate (LOPRESSOR) 25 MG tablet TAKE 1 TABLET BY MOUTH  TWICE DAILY  . Multiple Vitamins-Minerals (PRESERVISION AREDS PO) Take 1  tablet by mouth 2 (two) times daily.   . [DISCONTINUED] flecainide (TAMBOCOR) 50 MG tablet TAKE 1 AND 1/2 TABLETS BY  MOUTH TWICE DAILY     Allergies:   Codeine phosphate, Demerol [meperidine], Meperidine hcl, Meperidine hcl, and Sulfa antibiotics   Social History   Socioeconomic History  . Marital status: Married    Spouse name: Not on file  . Number of children: 0  . Years of education: Not on file  . Highest education level: Not on file  Occupational History  . Occupation: Veterinary surgeon  . Occupation: REPORTER    Employer: [employer]  Tobacco Use  . Smoking status: Never Smoker  . Smokeless tobacco: Never Used  Substance and Sexual Activity  . Alcohol use: No     Comment: never  . Drug use: No  . Sexual activity: Not on file  Other Topics Concern  . Not on file  Social History Narrative   Married   hh of 2    p0g0   Works for [employer]    Social Determinants of Radio broadcast assistant Strain:   . Difficulty of Paying Living Expenses: Not on file  Food Insecurity:   . Worried About Charity fundraiser in the Last Year: Not on file  . Ran Out of Food in the Last Year: Not on file  Transportation Needs:   . Lack of Transportation (Medical): Not on file  . Lack of Transportation (Non-Medical): Not on file  Physical Activity:   . Days of Exercise per Week: Not on file  . Minutes of Exercise per Session: Not on file  Stress:   . Feeling of Stress : Not on file  Social Connections:   . Frequency of Communication with Friends and Family: Not on file  . Frequency of Social Gatherings with Friends and Family: Not on file  . Attends Religious Services: Not on file  . Active Member of Clubs or Organizations: Not on file  . Attends Archivist Meetings: Not on file  . Marital Status: Not on file     Family History: The patient's family history includes Coronary artery disease (age of onset: 48) in her father; Esophageal cancer in her paternal grandmother; Hyperlipidemia in her mother; Scleroderma in her mother.  ROS:   Please see the history of present illness.     All other systems reviewed and are negative.  EKGs/Labs/Other Studies Reviewed:    The following studies were reviewed today:  Echo 05/10/2020 1. Left ventricular ejection fraction, by estimation, is 55 to 60%. The  left ventricle has normal function. The left ventricle has no regional  wall motion abnormalities. The left ventricular internal cavity size was  mildly dilated. Left ventricular  diastolic parameters are consistent with Grade I diastolic dysfunction  (impaired relaxation). The average left ventricular global longitudinal  strain is -19.6 %. The  global longitudinal strain is normal.  2. Right ventricular systolic function is normal. The right ventricular  size is normal. There is normal pulmonary artery systolic pressure.  3. Left atrial size was mildly dilated.  4. The mitral valve is normal in structure. Mild mitral valve  regurgitation. No evidence of mitral stenosis.  5. The aortic valve is normal in structure. Aortic valve regurgitation is  moderate. No aortic stenosis is present.  6. Aortic dilatation noted. Aneurysm of the ascending aorta, measuring 41  mm.  7. The inferior vena cava is normal in size with greater than 50%  respiratory variability, suggesting right atrial pressure of 3 mmHg.   Comparison(s): 08/06/18 EF 55-60%. Moderate AI.   EKG:  EKG is ordered today.  The ekg ordered today demonstrates normal sinus rhythm, no significant ST-T wave changes.  Recent Labs: 10/06/2019: TSH 0.867 11/20/2019: BUN 21; Creat 0.75; Potassium 4.1; Sodium 143 05/31/2020: Hemoglobin 14.4; Platelets 231  Recent Lipid Panel    Component Value Date/Time   CHOL 226 (H) 05/17/2018 0912   TRIG 84.0 05/17/2018 0912   HDL 79.30 05/17/2018 0912   CHOLHDL 3 05/17/2018 0912   VLDL 16.8 05/17/2018 0912   LDLCALC 129 (H) 05/17/2018 0912   LDLDIRECT 131.0 09/03/2009 0813    Physical Exam:    VS:  BP (!) 150/70 Comment: left arm 140/60  Pulse (!) 53   Ht 5\' 5"  (1.651 m)   Wt 111 lb 12.8 oz (50.7 kg)   SpO2 96%   BMI 18.60 kg/m     Wt Readings from Last 3 Encounters:  07/23/20 111 lb 12.8 oz (50.7 kg)  07/14/20 110 lb (49.9 kg)  05/31/20 110 lb 12.8 oz (50.3 kg)     GEN:  Well nourished, well developed in no acute distress HEENT: Normal NECK: No JVD; No carotid bruits LYMPHATICS: No lymphadenopathy CARDIAC: RRR, no murmurs, rubs, gallops RESPIRATORY:  Clear to auscultation without rales, wheezing or rhonchi  ABDOMEN: Soft, non-tender, non-distended MUSCULOSKELETAL:  No edema; No deformity  SKIN: Warm and  dry NEUROLOGIC:  Alert and oriented x 3 PSYCHIATRIC:  Normal affect   ASSESSMENT:    1. PAF (paroxysmal atrial fibrillation) ([place])   2. Nonrheumatic aortic valve insufficiency   3. Essential hypertension   4. Thoracic aortic aneurysm without rupture ([place])    PLAN:    In order of problems listed above:  1. Paroxysmal atrial fibrillation: Recently, patient has been having increased palpitation.  I recommended increase flecainide to 100 mg twice a day from the current 75 mg twice a day dosing.  On Eliquis.  I plan to see the patient back in a few weeks with repeat EKG to check her QT interval.  2. Aortic insufficiency: Recent echocardiogram showed moderate AI.  Patient is asymptomatic  3. Hypertension: Continue current therapy  4. Thoracic aortic aneurysm: Recent echocardiogram revealed thoracic aortic aneurysm measuring at 41 mm.   Medication Adjustments/Labs and Tests Ordered: Current medicines are reviewed at length with the patient today.  Concerns regarding medicines are outlined above.  Orders Placed This Encounter  Procedures  . EKG 12-Lead   Meds ordered this encounter  Medications  . flecainide (TAMBOCOR) 100 MG tablet    Sig: Take 1 tablet (100 mg total) by mouth 2 (two) times daily.    Dispense:  180 tablet    Refill:  3    Pt will call when need a refill    Patient Instructions  Medication Instructions:  Increase Flecainide to 100 mg twice a day.  *If you need a refill on your cardiac medications before your next appointment, please call your pharmacy*   Lab Work: None ordered   Testing/Procedures: None ordered    Follow-Up: At [facility], you and your health needs are our priority.  As part of our continuing mission to provide you with exceptional heart care, we have created designated Provider Care Teams.  These Care Teams include your primary Cardiologist (physician) and Advanced Practice Providers (APPs -  Physician Assistants and Nurse  Practitioners) who all work together to provide you with the care you need,  when you need it.  We recommend signing up for the patient portal called "MyChart".  Sign up information is provided on this After Visit Summary.  MyChart is used to connect with patients for Virtual Visits (Telemedicine).  Patients are able to view lab/test results, encounter notes, upcoming appointments, etc.  Non-urgent messages can be sent to your provider as well.   To learn more about what you can do with MyChart, go to [email or web address].    Your next appointment:   August 02, 2020 at 4:15 pm  The format for your next appointment:   In Person  Provider:   Almyra Deforest, [place]        Signed, Almyra Deforest, [place]  07/25/2020 9:08 PM    [place]

## 2020-07-25 ENCOUNTER — Encounter: Payer: Self-pay | Admitting: Physician Assistant

## 2020-07-26 ENCOUNTER — Other Ambulatory Visit: Payer: Self-pay | Admitting: Internal Medicine

## 2020-07-28 ENCOUNTER — Encounter: Payer: Self-pay | Admitting: Orthopaedic Surgery

## 2020-07-28 ENCOUNTER — Other Ambulatory Visit: Payer: Self-pay

## 2020-07-28 ENCOUNTER — Ambulatory Visit: Payer: Medicare Other | Admitting: Orthopaedic Surgery

## 2020-07-28 VITALS — Ht 65.0 in | Wt 111.0 lb

## 2020-07-28 DIAGNOSIS — M79605 Pain in left leg: Secondary | ICD-10-CM | POA: Diagnosis not present

## 2020-07-28 NOTE — Progress Notes (Deleted)
Office Visit Note   Patient: [name] [name]           Date of Birth: Mar 30, 1951           MRN: [id number] Visit Date: 07/28/2020              Requested by: Burnis Medin, MD [place],  [place] [address] PCP: Burnis Medin, MD   Assessment & Plan: Visit Diagnoses: No diagnosis found.  Plan: ***  Follow-Up Instructions: No follow-ups on file.   Orders:  No orders of the defined types were placed in this encounter.  No orders of the defined types were placed in this encounter.     Procedures: No procedures performed   Clinical Data: No additional findings.   Subjective: Chief Complaint  Patient presents with  . Left Leg - Follow-up  Patient presents today for follow up on her left leg pain. She states that her leg was feeling much better, and almost cancelled her appointment. But then the pain returned in her anterior lower left leg. The pain is not nearly as bad as before, and only occurs with standing. No pain with walking. She also states that she has some pain in the same area, but in the right lower leg. She does not take anything for pain.   HPI  Review of Systems   Objective: Vital Signs: There were no vitals taken for this visit.  Physical Exam  Ortho Exam  Specialty Comments:  No specialty comments available.  Imaging: No results found.   PMFS History: Patient Active Problem List   Diagnosis Date Noted  . Posterior capsular opacification non visually significant of left eye 07/19/2020  . Left epiretinal membrane 07/19/2020  . Right epiretinal membrane 07/19/2020  . Low back pain 07/14/2020  . Pain in left leg 07/14/2020  . Educated about COVID-19 virus infection 05/30/2020  . Chronic pain of right knee 03/31/2019  . Essential hypertension 06/30/2017  . Age related osteoporosis 10/19/2016  . Paroxysmal atrial fibrillation ([place]) 09/27/2015  . Hemorrhage of rectum and anus 04/16/2014  . Special screening for malignant  neoplasms, colon 04/16/2014  . Hemorrhoids, unspecified hemorrhoid type 04/16/2014  . Plantar wart of left foot 05/14/2013  . Foot callus 05/14/2013  . Bilateral bunions 03/13/2013  . Hypertrophic toenail 03/13/2013  . Vision disturbance 03/13/2013  . Post-nasal drainage 05/05/2012  . Aortic insufficiency 01/16/2012  . Macular degeneration 10/12/2011  . ANXIETY, SITUATIONAL 05/08/2008  . Vitamin D insufficiency 05/22/2007  . ELEVATED BLOOD PRESSURE WITHOUT DIAGNOSIS OF HYPERTENSION 05/22/2007  . Allergic rhinitis due to allergen 03/28/2007   Past Medical History:  Diagnosis Date  . Allergic rhinitis   . Aortic insufficiency   . CAT SCRATCH 09/10/2009   Qualifier: Diagnosis of  By: Regis Bill MD, [name]   . Clavicle fracture 3/08   left  . Diverticulosis   . HTN (hypertension)   . Osteopenia   . Paroxysmal atrial fibrillation (HCC)   . Vision disturbance 03/13/2013   Negative neuro MRA MRI    Family History  Problem Relation Age of Onset  . Scleroderma Mother   . Hyperlipidemia Mother   . Coronary artery disease Father 63  . Esophageal cancer Paternal Grandmother     Past Surgical History:  Procedure Laterality Date  . ABDOMINAL HYSTERECTOMY     pt denies 04/15/14  . APPENDECTOMY    . LAPAROSCOPIC SALPINGO OOPHERECTOMY Right   . TONSILLECTOMY AND ADENOIDECTOMY     Social  History   Occupational History  . Occupation: Veterinary surgeon  . Occupation: REPORTER    Employer: [employer]  Tobacco Use  . Smoking status: Never Smoker  . Smokeless tobacco: Never Used  Substance and Sexual Activity  . Alcohol use: No    Comment: never  . Drug use: No  . Sexual activity: Not on file

## 2020-07-28 NOTE — Progress Notes (Signed)
Office Visit Note   Patient: [name] [name]           Date of Birth: 09/02/1951           MRN: [id number] Visit Date: 07/28/2020              Requested by: Burnis Medin, MD [place],  [place] [address] PCP: Burnis Medin, MD   Assessment & Plan: Visit Diagnoses:  1. Pain in left leg     Plan: [name] [name] relates that she is actually feeling a little better and terms of her left leg pain.  She almost considered canceling the appointment but came back because she had some recurrence of pain but on the more mild basis.  She does have trouble when she first gets up in the morning it applies any weight to her left leg.  The pain seems to be localized in the area of the tibia.  Her prior films demonstrated some periosteal elevation of the mid left tibia anteriorly that indeterminate in terms of pathology.  She does have history of osteoporosis and is on Fosamax.  She could have had a stress fracture.  It does not look like this would be an osteoid osteoma and there is no other bony changes and she is not really having any pain at night.  She is terribly claustrophobic and I think that she would be be better off with a CT scan. Long discussion regarding differential diagnosis.  She is not having any bowel or bladder changes or neurologic changes.  Very minimal discomfort in her right leg.  Follow-Up Instructions: Return After CT scan left tibia.   Orders:  Orders Placed This Encounter  Procedures   CT TIBIA FIBULA LEFT WO CONTRAST   No orders of the defined types were placed in this encounter.     Procedures: No procedures performed   Clinical Data: No additional findings.   Subjective: Chief Complaint  Patient presents with   Left Leg - Follow-up  Actually having a little less pain in her left leg that seems to be localized to the area between her left knee and left ankle.  No related numbness or tingling.  No bowel or bladder changes.  Has had minimal  discomfort in her right leg.  No pain at night.  Pain seems to be initially when she gets up in the morning and ambulates.  After a short period of time most of the pain seems to resolve.  She does have a history of osteoporosis and is on Fosamax.  Also has a history of atrial fibrillation and is taking Eliquis  HPI  Review of Systems   Objective: Vital Signs: Ht 5\' 5"  (1.651 m)    Wt 111 lb (50.3 kg)    BMI 18.47 kg/m   Physical Exam Constitutional:      Appearance: She is well-developed.  Eyes:     Pupils: Pupils are equal, round, and reactive to light.  Pulmonary:     Effort: Pulmonary effort is normal.  Skin:    General: Skin is warm and dry.  Neurological:     Mental Status: She is alert and oriented to person, place, and time.  Psychiatric:        Behavior: Behavior normal.     Ortho Exam awake alert and oriented x3.  Comfortable sitting.  Straight leg raise negative bilaterally.  No use of ambulatory aid.  No acute distress.  Painless range of motion both  knees.  Does have very minimal percussible tenderness of the anterior aspect of the left tibia.  No skin changes.  No edema.  Neurologically intact  Specialty Comments:  No specialty comments available.  Imaging: No results found.   PMFS History: Patient Active Problem List   Diagnosis Date Noted   Posterior capsular opacification non visually significant of left eye 07/19/2020   Left epiretinal membrane 07/19/2020   Right epiretinal membrane 07/19/2020   Low back pain 07/14/2020   Pain in left leg 07/14/2020   Educated about COVID-19 virus infection 05/30/2020   Chronic pain of right knee 03/31/2019   Essential hypertension 06/30/2017   Age related osteoporosis 10/19/2016   Paroxysmal atrial fibrillation ([place]) 09/27/2015   Hemorrhage of rectum and anus 04/16/2014   Special screening for malignant neoplasms, colon 04/16/2014   Hemorrhoids, unspecified hemorrhoid type 04/16/2014   Plantar wart  of left foot 05/14/2013   Foot callus 05/14/2013   Bilateral bunions 03/13/2013   Hypertrophic toenail 03/13/2013   Vision disturbance 03/13/2013   Post-nasal drainage 05/05/2012   Aortic insufficiency 01/16/2012   Macular degeneration 10/12/2011   ANXIETY, SITUATIONAL 05/08/2008   Vitamin D insufficiency 05/22/2007   ELEVATED BLOOD PRESSURE WITHOUT DIAGNOSIS OF HYPERTENSION 05/22/2007   Allergic rhinitis due to allergen 03/28/2007   Past Medical History:  Diagnosis Date   Allergic rhinitis    Aortic insufficiency    CAT SCRATCH 09/10/2009   Qualifier: Diagnosis of  By: Regis Bill MD, [name]    Clavicle fracture 3/08   left   Diverticulosis    HTN (hypertension)    Osteopenia    Paroxysmal atrial fibrillation (HCC)    Vision disturbance 03/13/2013   Negative neuro MRA MRI    Family History  Problem Relation Age of Onset   Scleroderma Mother    Hyperlipidemia Mother    Coronary artery disease Father 19   Esophageal cancer Paternal Grandmother     Past Surgical History:  Procedure Laterality Date   ABDOMINAL HYSTERECTOMY     pt denies 04/15/14   APPENDECTOMY     LAPAROSCOPIC SALPINGO OOPHERECTOMY Right    TONSILLECTOMY AND ADENOIDECTOMY     Social History   Occupational History   Occupation: Veterinary surgeon   Occupation: REPORTER    Employer: [employer]  Tobacco Use   Smoking status: Never Smoker   Smokeless tobacco: Never Used  Substance and Sexual Activity   Alcohol use: No    Comment: never   Drug use: No   Sexual activity: Not on file     Garald Balding, MD   Note - This record has been created using Editor, commissioning.  Chart creation errors have been sought, but may not always  have been located. Such creation errors do not reflect on  the standard of medical care.

## 2020-07-30 ENCOUNTER — Encounter: Payer: Self-pay | Admitting: Orthopaedic Surgery

## 2020-08-02 ENCOUNTER — Other Ambulatory Visit: Payer: Self-pay

## 2020-08-02 ENCOUNTER — Encounter: Payer: Self-pay | Admitting: Physician Assistant

## 2020-08-02 ENCOUNTER — Ambulatory Visit (INDEPENDENT_AMBULATORY_CARE_PROVIDER_SITE_OTHER): Payer: Medicare Other | Admitting: Physician Assistant

## 2020-08-02 VITALS — BP 142/66 | HR 53 | Ht 65.0 in | Wt 112.4 lb

## 2020-08-02 DIAGNOSIS — I712 Thoracic aortic aneurysm, without rupture, unspecified: Secondary | ICD-10-CM

## 2020-08-02 DIAGNOSIS — I10 Essential (primary) hypertension: Secondary | ICD-10-CM | POA: Diagnosis not present

## 2020-08-02 DIAGNOSIS — I480 Paroxysmal atrial fibrillation: Secondary | ICD-10-CM

## 2020-08-02 DIAGNOSIS — I351 Nonrheumatic aortic (valve) insufficiency: Secondary | ICD-10-CM

## 2020-08-02 NOTE — Progress Notes (Signed)
Cardiology Office Note:    Date:  08/04/2020   ID:  [name] [name], DOB June 03, 1951, MRN [id number]  PCP:  Burnis Medin, MD  [facility] Cardiologist:  Minus Breeding, MD  [place] Electrophysiologist:  None   Referring MD: Burnis Medin, MD   Chief Complaint  Patient presents with  . Follow-up    seen for Dr. Percival Spanish    History of Present Illness:    [name] [name] is a 69 y.o. female with a hx of aortic insufficiency, hypertension and PAF.  She was diagnosed with atrial fibrillation by event monitor in September 2016.  She is on metoprolol, diltiazem and flecainide for atrial fibrillation.  Previous ETT obtained on 10/17/2017 did not show any QT prolongation or ventricular ectopy on flecainide.  Echocardiogram obtained on 05/10/2020 showed EF 55 to 60%, grade 1 DD, mild MR, moderate AI, dilated ascending aorta measuring at 41 mm.  She was last seen by Dr. Percival Spanish on 05/31/2020, despite having occasional breakthrough A. fib, she did not wish to increase her flecainide at the time.  I last saw the patient on 11/23/2019, she was having more breakthrough atrial fibrillation.  We discussed various options, eventually I decided to increase her flecainide to 100 mg twice a day.  She presents today for follow-up.  Since the last visit, she did have two episode of atrial fibrillation, both were very brief.  She is maintaining sinus rhythm today.  I recommended continue on the current dose of flecainide as she had symptom before on the higher dose of flecainide.  If she continued to have frequent breakthrough atrial fibrillation (at least two episodes per week) or prolonged A. fib last longer than 6 hours, I likely will refer her to Dr. Rayann Heman to consider stronger antiarrhythmic therapy versus atrial fibrillation ablation.  Otherwise she can follow-up with Dr. Percival Spanish in 3 months  Past Medical History:  Diagnosis Date  . Allergic rhinitis   . Aortic insufficiency   . CAT SCRATCH 09/10/2009     Qualifier: Diagnosis of  By: Regis Bill MD, [name]   . Clavicle fracture 3/08   left  . Diverticulosis   . HTN (hypertension)   . Osteopenia   . Paroxysmal atrial fibrillation (HCC)   . Vision disturbance 03/13/2013   Negative neuro MRA MRI    Past Surgical History:  Procedure Laterality Date  . ABDOMINAL HYSTERECTOMY     pt denies 04/15/14  . APPENDECTOMY    . LAPAROSCOPIC SALPINGO OOPHERECTOMY Right   . TONSILLECTOMY AND ADENOIDECTOMY      Current Medications: Current Meds  Medication Sig  . acetaminophen (TYLENOL) 500 MG tablet Take 500 mg by mouth as needed.  [name] alendronate (FOSAMAX) 70 MG tablet TAKE 1 TABLET ONCE A WEEK  WITH A FULL GLASS OF WATER  ON AN EMPTY STOMACH  . Calcium Carbonate-Vitamin D (CALCIUM PLUS VITAMIN D PO) Take 1 tablet by mouth daily.   . Cholecalciferol (VITAMIN D3) 1000 units CAPS Take 2 capsules by mouth daily.   [name] diltiazem (CARDIZEM CD) 300 MG 24 hr capsule TAKE 1 CAPSULE BY MOUTH  DAILY  . ELIQUIS 5 MG TABS tablet TAKE 1 TABLET BY MOUTH  TWICE DAILY  . flecainide (TAMBOCOR) 100 MG tablet Take 1 tablet (100 mg total) by mouth 2 (two) times daily.  [name] LORazepam (ATIVAN) 0.5 MG tablet Take 1 tablet by mouth as needed.  . metoprolol tartrate (LOPRESSOR) 25 MG tablet TAKE 1 TABLET BY MOUTH  TWICE DAILY  .  Multiple Vitamins-Minerals (PRESERVISION AREDS PO) Take 1 tablet by mouth 2 (two) times daily.      Allergies:   Codeine phosphate, Demerol [meperidine], Meperidine hcl, Meperidine hcl, and Sulfa antibiotics   Social History   Socioeconomic History  . Marital status: Married    Spouse name: Not on file  . Number of children: 0  . Years of education: Not on file  . Highest education level: Not on file  Occupational History  . Occupation: Veterinary surgeon  . Occupation: REPORTER    Employer: [employer]  Tobacco Use  . Smoking status: Never Smoker  . Smokeless tobacco: Never Used  Substance and Sexual Activity  . Alcohol use: No     Comment: never  . Drug use: No  . Sexual activity: Not on file  Other Topics Concern  . Not on file  Social History Narrative   Married   hh of 2    p0g0   Works for [employer]    Social Determinants of Radio broadcast assistant Strain:   . Difficulty of Paying Living Expenses: Not on file  Food Insecurity:   . Worried About Charity fundraiser in the Last Year: Not on file  . Ran Out of Food in the Last Year: Not on file  Transportation Needs:   . Lack of Transportation (Medical): Not on file  . Lack of Transportation (Non-Medical): Not on file  Physical Activity:   . Days of Exercise per Week: Not on file  . Minutes of Exercise per Session: Not on file  Stress:   . Feeling of Stress : Not on file  Social Connections:   . Frequency of Communication with Friends and Family: Not on file  . Frequency of Social Gatherings with Friends and Family: Not on file  . Attends Religious Services: Not on file  . Active Member of Clubs or Organizations: Not on file  . Attends Archivist Meetings: Not on file  . Marital Status: Not on file     Family History: The patient's family history includes Coronary artery disease (age of onset: 75) in her father; Esophageal cancer in her paternal grandmother; Hyperlipidemia in her mother; Scleroderma in her mother.  ROS:   Please see the history of present illness.     All other systems reviewed and are negative.  EKGs/Labs/Other Studies Reviewed:    The following studies were reviewed today:  Echo 05/10/2020 . Left ventricular ejection fraction, by estimation, is 55 to 60%. The  left ventricle has normal function. The left ventricle has no regional  wall motion abnormalities. The left ventricular internal cavity size was  mildly dilated. Left ventricular  diastolic parameters are consistent with Grade I diastolic dysfunction  (impaired relaxation). The average left ventricular global longitudinal  strain is -19.6 %. The  global longitudinal strain is normal.  2. Right ventricular systolic function is normal. The right ventricular  size is normal. There is normal pulmonary artery systolic pressure.  3. Left atrial size was mildly dilated.  4. The mitral valve is normal in structure. Mild mitral valve  regurgitation. No evidence of mitral stenosis.  5. The aortic valve is normal in structure. Aortic valve regurgitation is  moderate. No aortic stenosis is present.  6. Aortic dilatation noted. Aneurysm of the ascending aorta, measuring 41  mm.  7. The inferior vena cava is normal in size with greater than 50%  respiratory variability, suggesting right atrial pressure of 3 mmHg.  Comparison(s): 08/06/18 EF 55-60%. Moderate AI.   EKG:  EKG is ordered today.  The ekg ordered today demonstrates normal sinus rhythm, heart rate 53 bpm, no significant ST-T wave changes.  Recent Labs: 10/06/2019: TSH 0.867 11/20/2019: BUN 21; Creat 0.75; Potassium 4.1; Sodium 143 05/31/2020: Hemoglobin 14.4; Platelets 231  Recent Lipid Panel    Component Value Date/Time   CHOL 226 (H) 05/17/2018 0912   TRIG 84.0 05/17/2018 0912   HDL 79.30 05/17/2018 0912   CHOLHDL 3 05/17/2018 0912   VLDL 16.8 05/17/2018 0912   LDLCALC 129 (H) 05/17/2018 0912   LDLDIRECT 131.0 09/03/2009 0813    Physical Exam:    VS:  BP (!) 142/66   Pulse (!) 53   Ht 5\' 5"  (1.651 m)   Wt 112 lb 6.4 oz (51 kg)   SpO2 98%   BMI 18.70 kg/m     Wt Readings from Last 3 Encounters:  08/02/20 112 lb 6.4 oz (51 kg)  07/28/20 111 lb (50.3 kg)  07/23/20 111 lb 12.8 oz (50.7 kg)     GEN: Well nourished, well developed in no acute distress HEENT: Normal NECK: No JVD; No carotid bruits LYMPHATICS: No lymphadenopathy CARDIAC: RRR, no murmurs, rubs, gallops RESPIRATORY:  Clear to auscultation without rales, wheezing or rhonchi  ABDOMEN: Soft, non-tender, non-distended MUSCULOSKELETAL:  No edema; No deformity  SKIN: Warm and dry NEUROLOGIC:  Alert  and oriented x 3 PSYCHIATRIC:  Normal affect   ASSESSMENT:    1. PAF (paroxysmal atrial fibrillation) ([place])   2. Essential hypertension   3. Nonrheumatic aortic valve insufficiency   4. Thoracic aortic aneurysm without rupture ([place])    PLAN:    In order of problems listed above:  1. PAF: Continue on the current dose of flecainide.  If she continued to have frequent recurrence, plan to refer to Dr. Rayann Heman for further management  2. Hypertension: Blood pressure stable  3. Aortic insufficiency: Moderate AI noted on recent echocardiogram  4. Thoracic aortic aneurysm: Recent echocardiogram suggested 41 mm in diameter.   Medication Adjustments/Labs and Tests Ordered: Current medicines are reviewed at length with the patient today.  Concerns regarding medicines are outlined above.  No orders of the defined types were placed in this encounter.  No orders of the defined types were placed in this encounter.   Patient Instructions  Medication Instructions:  No changes *If you need a refill on your cardiac medications before your next appointment, please call your pharmacy*   Lab Work: None ordered If you have labs (blood work) drawn today and your tests are completely normal, you will receive your results only by: [name] MyChart Message (if you have MyChart) OR . A paper copy in the mail If you have any lab test that is abnormal or we need to change your treatment, we will call you to review the results.   Testing/Procedures: None ordered   Follow-Up: At [facility], you and your health needs are our priority.  As part of our continuing mission to provide you with exceptional heart care, we have created designated Provider Care Teams.  These Care Teams include your primary Cardiologist (physician) and Advanced Practice Providers (APPs -  Physician Assistants and Nurse Practitioners) who all work together to provide you with the care you need, when you need it.  We recommend  signing up for the patient portal called "MyChart".  Sign up information is provided on this After Visit Summary.  MyChart is used to connect with patients for  Virtual Visits (Telemedicine).  Patients are able to view lab/test results, encounter notes, upcoming appointments, etc.  Non-urgent messages can be sent to your provider as well.   To learn more about what you can do with MyChart, go to [email or web address].    Your next appointment:   3 month(s)  The format for your next appointment:   In Person  Provider:   Minus Breeding, MD   Other Instructions None     Signed, [name] [name], [place]  08/04/2020 7:11 PM     [facility]

## 2020-08-02 NOTE — Patient Instructions (Signed)
Medication Instructions:  No changes *If you need a refill on your cardiac medications before your next appointment, please call your pharmacy*   Lab Work: None ordered If you have labs (blood work) drawn today and your tests are completely normal, you will receive your results only by: . MyChart Message (if you have MyChart) OR . A paper copy in the mail If you have any lab test that is abnormal or we need to change your treatment, we will call you to review the results.   Testing/Procedures: None ordered   Follow-Up: At [facility], you and your health needs are our priority.  As part of our continuing mission to provide you with exceptional heart care, we have created designated Provider Care Teams.  These Care Teams include your primary Cardiologist (physician) and Advanced Practice Providers (APPs -  Physician Assistants and Nurse Practitioners) who all work together to provide you with the care you need, when you need it.  We recommend signing up for the patient portal called "MyChart".  Sign up information is provided on this After Visit Summary.  MyChart is used to connect with patients for Virtual Visits (Telemedicine).  Patients are able to view lab/test results, encounter notes, upcoming appointments, etc.  Non-urgent messages can be sent to your provider as well.   To learn more about what you can do with MyChart, go to [email or web address].    Your next appointment:   3 month(s)  The format for your next appointment:   In Person  Provider:   James Hochrein, MD   Other Instructions None   

## 2020-08-04 ENCOUNTER — Encounter: Payer: Self-pay | Admitting: Physician Assistant

## 2020-08-04 NOTE — Telephone Encounter (Signed)
cdc advice  is that you can get them together   .  I just   Say if  You have  a side effect  Easier to tell what is causing  It   If wait in between   But not that  Clinically important at this time   Your choice   .

## 2020-08-05 NOTE — Addendum Note (Signed)
Addended by: Wonda Horner on: 08/05/2020 12:24 PM   Modules accepted: Orders

## 2020-08-12 ENCOUNTER — Telehealth: Payer: Self-pay | Admitting: Internal Medicine

## 2020-08-12 NOTE — Progress Notes (Signed)
  Chronic Care Management   Outreach Note  08/12/2020 Name: [name] [name] MRN: [id number] DOB: August 21, 1951  Referred by: Burnis Medin, MD Reason for referral : No chief complaint on file.   An unsuccessful telephone outreach was attempted today. The patient was referred to the pharmacist for assistance with care management and care coordination.   Follow Up Plan:   Carley Perdue [employer] Scheduler

## 2020-08-16 ENCOUNTER — Ambulatory Visit
Admission: RE | Admit: 2020-08-16 | Discharge: 2020-08-16 | Disposition: A | Discharge status: Home / Self Care | Payer: Medicare Other | Source: Ambulatory Visit | Attending: Orthopaedic Surgery | Admitting: Orthopaedic Surgery

## 2020-08-16 DIAGNOSIS — R600 Localized edema: Secondary | ICD-10-CM | POA: Diagnosis not present

## 2020-08-16 DIAGNOSIS — M79605 Pain in left leg: Secondary | ICD-10-CM | POA: Diagnosis not present

## 2020-08-17 ENCOUNTER — Telehealth: Payer: Self-pay | Admitting: Internal Medicine

## 2020-08-17 ENCOUNTER — Encounter: Payer: Self-pay | Admitting: Orthopaedic Surgery

## 2020-08-17 ENCOUNTER — Ambulatory Visit: Payer: Medicare Other | Attending: Internal Medicine

## 2020-08-17 DIAGNOSIS — Z23 Encounter for immunization: Secondary | ICD-10-CM

## 2020-08-17 NOTE — Progress Notes (Signed)
  Chronic Care Management   Outreach Note  08/17/2020 Name: [name] [name] MRN: [id number] DOB: 09-29-1951  Referred by: Burnis Medin, MD Reason for referral : No chief complaint on file.   A second unsuccessful telephone outreach was attempted today. The patient was referred to pharmacist for assistance with care management and care coordination.  Follow Up Plan:   Carley Perdue [employer] Scheduler

## 2020-08-17 NOTE — Progress Notes (Signed)
  Chronic Care Management   Note  08/17/2020 Name: CLEDA IMEL MRN: [id number] DOB: 10/03/1951  Alanson Aly Cottone is a 69 y.o. year old female who is a primary care patient of [name], [name], MD. I reached out to [name] by phone today in response to a referral sent by Ms. Evalin D Burkel's PCP, [name], [name], MD.   Ms. Voisin was given information about Chronic Care Management services today including:  1. CCM service includes personalized support from designated clinical staff supervised by her physician, including individualized plan of care and coordination with other care providers 2. 24/7 contact phone numbers for assistance for urgent and routine care needs. 3. Service will only be billed when office clinical staff spend 20 minutes or more in a month to coordinate care. 4. Only one practitioner may furnish and bill the service in a calendar month. 5. The patient may stop CCM services at any time (effective at the end of the month) by phone call to the office staff.   Patient agreed to services and verbal consent obtained.   Follow up plan:   Carley Perdue [employer] Scheduler

## 2020-08-17 NOTE — Progress Notes (Signed)
   Covid-19 Vaccination Clinic  Name:  CHARLCIE PRISCO    MRN: [id number] DOB: [birth year]  08/17/2020  Ms. Chow was observed post Covid-19 immunization for 15 minutes without incident. She was provided with Vaccine Information Sheet and instruction to access the V-Safe system.   Ms. Wehrman was instructed to call 911 with any severe reactions post vaccine: [name] Difficulty breathing  . Swelling of face and throat  . A fast heartbeat  . A bad rash all over body  . Dizziness and weakness

## 2020-08-18 ENCOUNTER — Other Ambulatory Visit: Payer: Medicare Other

## 2020-08-19 NOTE — Addendum Note (Signed)
Addended byEulas Post, Dayron Odland on: 08/19/2020 11:00 AM   Modules accepted: Orders

## 2020-09-05 ENCOUNTER — Other Ambulatory Visit: Payer: Self-pay | Admitting: Cardiology

## 2020-09-14 DIAGNOSIS — H353131 Nonexudative age-related macular degeneration, bilateral, early dry stage: Secondary | ICD-10-CM | POA: Diagnosis not present

## 2020-09-14 DIAGNOSIS — H35371 Puckering of macula, right eye: Secondary | ICD-10-CM | POA: Diagnosis not present

## 2020-09-14 DIAGNOSIS — Z961 Presence of intraocular lens: Secondary | ICD-10-CM | POA: Diagnosis not present

## 2020-09-14 DIAGNOSIS — H26493 Other secondary cataract, bilateral: Secondary | ICD-10-CM | POA: Diagnosis not present

## 2020-09-14 DIAGNOSIS — H0102A Squamous blepharitis right eye, upper and lower eyelids: Secondary | ICD-10-CM | POA: Diagnosis not present

## 2020-09-15 ENCOUNTER — Encounter: Payer: Self-pay | Admitting: Internal Medicine

## 2020-09-15 ENCOUNTER — Other Ambulatory Visit: Payer: Self-pay | Admitting: Internal Medicine

## 2020-09-15 DIAGNOSIS — M810 Age-related osteoporosis without current pathological fracture: Secondary | ICD-10-CM

## 2020-09-20 ENCOUNTER — Telehealth: Payer: Self-pay | Admitting: Cardiology

## 2020-09-20 ENCOUNTER — Other Ambulatory Visit: Payer: Self-pay

## 2020-09-20 MED ORDER — FLECAINIDE ACETATE 100 MG PO TABS: 100.0000 mg | ORAL_TABLET | Freq: Two times a day (BID) | ORAL | 3 refills | Status: DC

## 2020-09-20 MED ORDER — FLECAINIDE ACETATE 100 MG PO TABS
100.0000 mg | ORAL_TABLET | Freq: Two times a day (BID) | ORAL | 3 refills | Status: DC
Start: 2020-09-20 — End: 2021-05-31

## 2020-09-20 NOTE — Telephone Encounter (Signed)
Refill sent to the pharmacy electronically.  

## 2020-09-20 NOTE — Telephone Encounter (Signed)
Pt c/o medication issue:  1. Name of Medication: flecainide (TAMBOCOR) 100 MG tablet  2. How are you currently taking this medication (dosage and times per day)? Yes; 100 mg 2 times daily  3. Are you having a reaction (difficulty breathing--STAT)? No   4. What is your medication issue? Please send an updated prescription to mail service at [facility], [place], [address]

## 2020-09-28 ENCOUNTER — Encounter: Payer: Self-pay | Admitting: Internal Medicine

## 2020-09-28 ENCOUNTER — Other Ambulatory Visit: Payer: Self-pay

## 2020-09-28 ENCOUNTER — Ambulatory Visit: Payer: Medicare Other | Admitting: Internal Medicine

## 2020-09-28 ENCOUNTER — Ambulatory Visit (INDEPENDENT_AMBULATORY_CARE_PROVIDER_SITE_OTHER)
Admission: RE | Admit: 2020-09-28 | Discharge: 2020-09-28 | Disposition: A | Discharge status: Home / Self Care | Payer: Medicare Other | Source: Ambulatory Visit | Attending: Internal Medicine | Admitting: Internal Medicine

## 2020-09-28 VITALS — BP 130/78 | HR 54 | Ht 65.0 in | Wt 112.8 lb

## 2020-09-28 DIAGNOSIS — M810 Age-related osteoporosis without current pathological fracture: Secondary | ICD-10-CM

## 2020-09-28 DIAGNOSIS — E559 Vitamin D deficiency, unspecified: Secondary | ICD-10-CM

## 2020-09-28 LAB — TSH: TSH: 0.82 u[IU]/mL (ref 0.35–4.50)

## 2020-09-28 NOTE — Patient Instructions (Addendum)
Continue Fosamax 70 mg weekly.  Continue vitamin D 2400 units daily.  Please stop at the lab.  Please come back for a follow-up appointment in 1 year.

## 2020-09-28 NOTE — Progress Notes (Addendum)
Patient ID: [name] [name], female   DOB: November 30, 1950, 69 y.o.   MRN: [id number]   This visit occurred during the SARS-CoV-2 public health emergency.  Safety protocols were in place, including screening questions prior to the visit, additional usage of staff PPE, and extensive cleaning of exam room while observing appropriate contact time as indicated for disinfecting solutions.   HPI  [name] [name] is a 69 y.o.-year-old female, returning for follow-up for osteoporosis.  Last visit 1 year ago. She has Medicare.  She has pain in L shin >> saw Dr. Lanell Matar >> had Xrays >> no pathology. Improving.  She was diagnosed with osteoporosis in 2017.  Reviewed previous DXA scan reports-stable scores in 2019: DXA (09/23/2018) Lumbar spine L1-L4 Femoral neck (FN) 33% distal radius  T-score  -1.7 RFN: -2.7 LFN: -2.3 n/a  Change in BMD from previous DXA test (%) Up 1.4% Down 0.4% n/a   Date L1-L4 T score FN T score FRAX   07/27/2016  -1.8 (-3.4%*) RFN: -2.6 (-10.2%*) LFN: -2.4   09/30/2010  -1.6 RFN: -2.0 LFN: -1.8 10 year MOF: 11.9%  10 year hip fracture: 2.2%    No fracture since last visitshe has a history of fract, however, ures: 1. collarbone - ~2010 - fell in a parking lot 2. L foot fx and sprained ankle - few years ago - tripped and fell 3. R foot fx and sprained ankle - 10/2015  She denies dizziness/vertigo/orthostasis/poor vision.  Reviewed previous osteoporosis treatments: - Fosamax for 5 years  - up to 2011 - We restarted Fosamax 70 mg weekly in 12/2016  She has a history of vitamin D insufficiency: Lab Results  Component Value Date   VD25OH 57.64 11/20/2019   VD25OH 43.25 09/23/2018   VD25OH 40.16 10/19/2017   VD25OH 31.41 12/20/2016   VD25OH 28.88 (L) 09/14/2016   She is on 600 mg of calcium and 2400 units vitamin D daily.  She continues to walk and does zumba. She was doing strength training 1-2 times a week, not much anymore, but plans to restart.  She is not on vitamin  A.  Menopause was in her late 43s.  She was on HRT.  Pt does have a FH of osteoporosis in mother - kyphosis.  No fractures.  No history of kidney stones.  No hyper or hypocalcemia or hyperparathyroidism: Lab Results  Component Value Date   PTH 59 09/14/2016   CALCIUM 9.6 11/20/2019   CALCIUM 9.8 10/06/2019   CALCIUM 9.6 05/17/2018   CALCIUM 9.4 10/19/2017   CALCIUM 9.6 09/14/2016   CALCIUM 9.5 10/15/2014   CALCIUM 9.5 04/14/2014   CALCIUM 9.4 04/13/2014   CALCIUM 9.4 03/13/2013   CALCIUM 9.4 12/08/2011   No history of thyrotoxicosis: Lab Results  Component Value Date   TSH 0.867 10/06/2019   TSH 0.80 05/17/2018   TSH 1.22 09/14/2016   TSH 1.065 08/25/2015   TSH 1.04 10/15/2014   No history of CKD: Lab Results  Component Value Date   BUN 21 11/20/2019   CREATININE 0.75 11/20/2019   She is on flecainide, metoprolol, Eliquis for A. fib.  She continues to go to the A. fib clinic.  She noticed that after stopping Singulair, she had less A. fib episodes.  She goes to [facility] >> pelvic training.   ROS: Constitutional: no weight gain/no weight loss, no fatigue, no subjective hyperthermia, no subjective hypothermia Eyes: no blurry vision, no xerophthalmia ENT: no sore throat, no nodules palpated in neck, no dysphagia, no  odynophagia, no hoarseness Cardiovascular: no CP/no SOB/no palpitations/no leg swelling Respiratory: no cough/no SOB/no wheezing Gastrointestinal: no N/no V/no D/no C/no acid reflux Musculoskeletal: no muscle aches/no joint aches Skin: no rashes, no hair loss Neurological: no tremors/no numbness/no tingling/no dizziness  I reviewed pt's medications, allergies, PMH, social hx, family hx, and changes were documented in the history of present illness. Otherwise, unchanged from my initial visit note.  Past Medical History:  Diagnosis Date   Allergic rhinitis    Aortic insufficiency    CAT SCRATCH 09/10/2009   Qualifier: Diagnosis of  By:  Regis Bill MD, [name]    Clavicle fracture 3/08   left   Diverticulosis    HTN (hypertension)    Osteopenia    Paroxysmal atrial fibrillation (HCC)    Vision disturbance 03/13/2013   Negative neuro MRA MRI   Past Surgical History:  Procedure Laterality Date   ABDOMINAL HYSTERECTOMY     pt denies 04/15/14   APPENDECTOMY     LAPAROSCOPIC SALPINGO OOPHERECTOMY Right    TONSILLECTOMY AND ADENOIDECTOMY     Social History   Social History   Marital status: Married    Spouse name: N/A   Number of children: 0 - had infertility tx's in her 51s   Occupational History   Newspaper Jouralist    REPORTER [employer]   Social History Main Topics   Smoking status: Never Smoker   Smokeless tobacco: Never Used   Alcohol use No     Comment: never   Drug use: No   Social History Narrative   Married   hh of 2    p0g0   Works for [employer] and record    Current Outpatient Medications on File Prior to Visit  Medication Sig Dispense Refill   metoprolol tartrate (LOPRESSOR) 25 MG tablet TAKE 1 TABLET BY MOUTH  TWICE DAILY 180 tablet 3   acetaminophen (TYLENOL) 500 MG tablet Take 500 mg by mouth as needed.     alendronate (FOSAMAX) 70 MG tablet TAKE 1 TABLET ONCE A WEEK  WITH A FULL GLASS OF WATER  ON AN EMPTY STOMACH 12 tablet 3   Calcium Carbonate-Vitamin D (CALCIUM PLUS VITAMIN D PO) Take 1 tablet by mouth daily.      Cholecalciferol (VITAMIN D3) 1000 units CAPS Take 2 capsules by mouth daily.      diltiazem (CARDIZEM CD) 300 MG 24 hr capsule TAKE 1 CAPSULE BY MOUTH  DAILY 90 capsule 3   ELIQUIS 5 MG TABS tablet TAKE 1 TABLET BY MOUTH  TWICE DAILY 180 tablet 3   flecainide (TAMBOCOR) 100 MG tablet Take 1 tablet (100 mg total) by mouth 2 (two) times daily. 180 tablet 3   LORazepam (ATIVAN) 0.5 MG tablet Take 1 tablet by mouth as needed.     Multiple Vitamins-Minerals (PRESERVISION AREDS PO) Take 1 tablet by mouth 2 (two) times daily.      No current  facility-administered medications on file prior to visit.   Allergies  Allergen Reactions   Codeine Phosphate     drowsy   Demerol [Meperidine] Nausea Only   Meperidine Hcl Nausea Only   Meperidine Hcl    Sulfa Antibiotics     Rash   Family History  Problem Relation Age of Onset   Scleroderma Mother    Hyperlipidemia Mother    Coronary artery disease Father 29   Esophageal cancer Paternal Grandmother    PE: BP 130/78    Pulse (!) 54    Ht 5'  5" (1.651 m)    Wt 112 lb 12.8 oz (51.2 kg)    SpO2 97%    BMI 18.77 kg/m  Wt Readings from Last 3 Encounters:  09/28/20 112 lb 12.8 oz (51.2 kg)  08/02/20 112 lb 6.4 oz (51 kg)  07/28/20 111 lb (50.3 kg)   Constitutional: Normal weight, in NAD Eyes: PERRLA, EOMI, no exophthalmos ENT: moist mucous membranes, no thyromegaly, no cervical lymphadenopathy Cardiovascular: RRR, No MRG Respiratory: CTA B Gastrointestinal: abdomen soft, NT, ND, BS+ Musculoskeletal: no deformities, strength intact in all 4 Skin: moist, warm, no rashes Neurological: no tremor with outstretched hands, DTR normal in all 4  Assessment: 1. Osteoporosis  2. Vit D insufficiency  Plan: 1. Osteoporosis -Likely postmenopausal/age-related and she also has a family history. -She continues to remain at high risk for fractures since her T-scores were lower than -2.5 -She initially agreed to start Prolia, however, this was too expensive so we started Fosamax 70 mg weekly in 12/2016.  She has no side effects from this: She denies hip/thigh pain, no jaw pain.  No dental work in progress.  Of note, she had pain in the left shin and was found to have an osteoma with some muscle edema around.  Pain has improved. -Reviewed together the last DXA scan from 2 years ago and the T-scores were stable.  We discussed that this is a positive result.  She is due for another DXA scan now >> she has this scheduled for later today. -She continues on calcium and vitamin D - we  again discussed about fall prevention.  No falls since last visit. -We again discussed about weightbearing exercises at last visit and again today. Discussed how to add weights when she is walking.  Also discussed that walking improves bone density more if done after meals, and especially downhill.  However, she does need to add additional weightbearing exercise.  I advised her to exercise for 30 minutes a day 5 times a week. -Reviewed latest BMP from 11/20/2019 and this showed normal calcium and kidney function.  We will recheck a BMP now.  2. Vit D insufficiency -Continues on 2400 units vitamin D daily (she believes - she will check at home) -She also takes a calcium supplement -Reviewed latest vitamin D level from 11/20/2019: 57.64, excellent -We will recheck this today  Orders Placed This Encounter  Procedures   TSH   VITAMIN D 25 Hydroxy (Vit-D Deficiency, Fractures)   BASIC METABOLIC PANEL WITH GFR  She needs to lay down for labs (2/2 nausea).  Component     Latest Ref Rng & Units 09/28/2020  Glucose     65 - 99 mg/dL 71  BUN     7 - 25 mg/dL 21  Creatinine     0.50 - 0.99 mg/dL 0.82  GFR, Est Non African American     > OR = 60 mL/min/1.46m2 74  GFR, Est African American     > OR = 60 mL/min/1.48m2 86  BUN/Creatinine Ratio     6 - 22 (calc) NOT APPLICABLE  Sodium     109 - 146 mmol/L 140  Potassium     3.5 - 5.3 mmol/L 4.4  Chloride     98 - 110 mmol/L 104  CO2     20 - 32 mmol/L 29  Calcium     8.6 - 10.4 mg/dL 9.3  TSH     0.35 - 4.50 uIU/mL 0.82  Vitamin D, 25-Hydroxy     30.0 -  100.0 ng/mL 44.3  Labs are all normal.  DXA results: 09/28/2020 ([facility]) Lumbar spine L1-L4 Femoral neck (FN)  T-score   -1.9 RFN: -2.5 LFN: -2.3  Change in BMD from previous DXA test (%) Down 2.% Up 2.6%  (*) statistically significant  We will continue Fosamax for now.  Philemon Kingdom, MD PhD [facility]

## 2020-09-29 ENCOUNTER — Encounter: Payer: Self-pay | Admitting: Internal Medicine

## 2020-09-29 ENCOUNTER — Ambulatory Visit (INDEPENDENT_AMBULATORY_CARE_PROVIDER_SITE_OTHER): Payer: Medicare Other | Admitting: Internal Medicine

## 2020-09-29 ENCOUNTER — Other Ambulatory Visit: Payer: Self-pay

## 2020-09-29 VITALS — BP 140/72 | HR 60 | Temp 97.8°F | Ht 65.0 in | Wt 112.0 lb

## 2020-09-29 DIAGNOSIS — L282 Other prurigo: Secondary | ICD-10-CM

## 2020-09-29 LAB — VITAMIN D 25 HYDROXY (VIT D DEFICIENCY, FRACTURES): Vit D, 25-Hydroxy: 44.3 ng/mL (ref 30.0–100.0)

## 2020-09-29 LAB — BASIC METABOLIC PANEL WITH GFR
BUN: 21 mg/dL (ref 7–25)
CO2: 29 mmol/L (ref 20–32)
Calcium: 9.3 mg/dL (ref 8.6–10.4)
Chloride: 104 mmol/L (ref 98–110)
Creat: 0.82 mg/dL (ref 0.50–0.99)
GFR, Est African American: 86 mL/min/{1.73_m2} (ref >=60)
GFR, Est Non African American: 74 mL/min/{1.73_m2} (ref >=60)
Glucose, Bld: 71 mg/dL (ref 65–99)
Potassium: 4.4 mmol/L (ref 3.5–5.3)
Sodium: 140 mmol/L (ref 135–146)

## 2020-09-29 MED ORDER — TRIAMCINOLONE ACETONIDE 0.1 % EX CREA: 1.0000 "application " | TOPICAL_CREAM | Freq: Two times a day (BID) | CUTANEOUS | 0 refills | Status: DC

## 2020-09-29 MED ORDER — VALACYCLOVIR HCL 1 G PO TABS: 1000.0000 mg | ORAL_TABLET | Freq: Three times a day (TID) | ORAL | 0 refills | Status: DC

## 2020-09-29 NOTE — Progress Notes (Signed)
Chief Complaint  Patient presents with  . Rash    on upper body, started Monday.  not painful has only been itchy    HPI: [name] [name] 69 y.o. come in for SDA  Concern about rash possible shingles  She has a remote history of shingles and has had the Shingrix vaccine. She has had 2-1/2 days of an itchy patch of rash on the right lateral thorax without obvious cause.  May have had a little bit on the left side but it is subsided.  No new topicals did use some CeraVe cream. No pain or fever. Is soon to have eye surgery. Wants to make sure any medicines do not interfere with her current meds.  ROS: See pertinent positives and negatives per HPI. A. fib in and out considering ablation. Past Medical History:  Diagnosis Date  . Allergic rhinitis   . Aortic insufficiency   . CAT SCRATCH 09/10/2009   Qualifier: Diagnosis of  By: Regis Bill MD, [name]   . Clavicle fracture 3/08   left  . Diverticulosis   . HTN (hypertension)   . Osteopenia   . Paroxysmal atrial fibrillation (HCC)   . Vision disturbance 03/13/2013   Negative neuro MRA MRI    Family History  Problem Relation Age of Onset  . Scleroderma Mother   . Hyperlipidemia Mother   . Coronary artery disease Father 9  . Esophageal cancer Paternal Grandmother     Social History   Socioeconomic History  . Marital status: Married    Spouse name: Not on file  . Number of children: 0  . Years of education: Not on file  . Highest education level: Not on file  Occupational History  . Occupation: Veterinary surgeon  . Occupation: REPORTER    Employer: [employer]  Tobacco Use  . Smoking status: Never Smoker  . Smokeless tobacco: Never Used  Substance and Sexual Activity  . Alcohol use: No    Comment: never  . Drug use: No  . Sexual activity: Not on file  Other Topics Concern  . Not on file  Social History Narrative   Married   hh of 2    p0g0   Works for [employer]    Social Determinants of Adult nurse Strain:   . Difficulty of Paying Living Expenses: Not on file  Food Insecurity:   . Worried About Charity fundraiser in the Last Year: Not on file  . Ran Out of Food in the Last Year: Not on file  Transportation Needs:   . Lack of Transportation (Medical): Not on file  . Lack of Transportation (Non-Medical): Not on file  Physical Activity:   . Days of Exercise per Week: Not on file  . Minutes of Exercise per Session: Not on file  Stress:   . Feeling of Stress : Not on file  Social Connections:   . Frequency of Communication with Friends and Family: Not on file  . Frequency of Social Gatherings with Friends and Family: Not on file  . Attends Religious Services: Not on file  . Active Member of Clubs or Organizations: Not on file  . Attends Archivist Meetings: Not on file  . Marital Status: Not on file    Outpatient Medications Prior to Visit  Medication Sig Dispense Refill  . acetaminophen (TYLENOL) 500 MG tablet Take 500 mg by mouth as needed.    [name] alendronate (FOSAMAX) 70 MG tablet TAKE  1 TABLET ONCE A WEEK  WITH A FULL GLASS OF WATER  ON AN EMPTY STOMACH 12 tablet 3  . Calcium Carbonate-Vitamin D (CALCIUM PLUS VITAMIN D PO) Take 1 tablet by mouth daily.     . Cholecalciferol (VITAMIN D3) 1000 units CAPS Take 2 capsules by mouth daily.     [name] diltiazem (CARDIZEM CD) 300 MG 24 hr capsule TAKE 1 CAPSULE BY MOUTH  DAILY 90 capsule 3  . ELIQUIS 5 MG TABS tablet TAKE 1 TABLET BY MOUTH  TWICE DAILY 180 tablet 3  . flecainide (TAMBOCOR) 100 MG tablet Take 1 tablet (100 mg total) by mouth 2 (two) times daily. 180 tablet 3  . LORazepam (ATIVAN) 0.5 MG tablet Take 1 tablet by mouth as needed.    . metoprolol tartrate (LOPRESSOR) 25 MG tablet TAKE 1 TABLET BY MOUTH  TWICE DAILY 180 tablet 3  . Multiple Vitamins-Minerals (PRESERVISION AREDS PO) Take 1 tablet by mouth 2 (two) times daily.      No facility-administered medications prior to visit.      EXAM:  BP 140/72   Pulse 60   Temp 97.8 F (36.6 C) (Oral)   Ht 5\' 5"  (1.651 m)   Wt 112 lb (50.8 kg)   SpO2 98%   BMI 18.64 kg/m   Body mass index is 18.64 kg/m.  GENERAL: vitals reviewed and listed above, alert, oriented, appears well hydrated and in no acute distress HEENT: atraumatic, conjunctiva  clear, no obvious abnormalities on inspection of external nose and ears OP : Masked Skin shows a 3 to 4 cm linear patch but slightly drying like an eczematous indistinct border without vesicle pustules or blotches.  Left side had a red line but no acute rashes.  MS: moves all extremities without noticeable focal  abnormality PSYCH: pleasant and cooperative, no obvious depression or anxiety Lab Results  Component Value Date   WBC 6.0 05/31/2020   HGB 14.4 05/31/2020   HCT 41.9 05/31/2020   PLT 231 05/31/2020   GLUCOSE 71 09/28/2020   CHOL 226 (H) 05/17/2018   TRIG 84.0 05/17/2018   HDL 79.30 05/17/2018   LDLDIRECT 131.0 09/03/2009   LDLCALC 129 (H) 05/17/2018   ALT 23 05/17/2018   AST 21 05/17/2018   NA 140 09/28/2020   K 4.4 09/28/2020   CL 104 09/28/2020   CREATININE 0.82 09/28/2020   BUN 21 09/28/2020   CO2 29 09/28/2020   TSH 0.82 09/28/2020   BP Readings from Last 3 Encounters:  09/29/20 140/72  09/28/20 130/78  08/02/20 (!) 142/66    ASSESSMENT AND PLAN:  Discussed the following assessment and plan:  Pruritic rash Appears more like a topical eczematous or contact rash however could have been a mitigated rash in a vaccinated person for shingles. At this point use topical prescription TMC twice a day. Printed prescription for the Valtrex if symptoms progress become painful or vesicular she can begin that medicine.  Either way she should do well. No obvious drug interactions noted. -Patient advised to return or notify health care team  if  new concerns arise.  Patient Instructions  Not sure if  Skin allergic  contact derm   Vs early  shingles  (  less likely)   But will print out.  Valtrex if looking more like shingles .      [name]. Chandrika Sandles M.D.

## 2020-09-29 NOTE — Patient Instructions (Signed)
Not sure if  Skin allergic  contact derm   Vs early  shingles  ( less likely)   But will print out.  Valtrex if looking more like shingles .

## 2020-09-30 DIAGNOSIS — H26493 Other secondary cataract, bilateral: Secondary | ICD-10-CM | POA: Diagnosis not present

## 2020-10-01 ENCOUNTER — Other Ambulatory Visit: Payer: Self-pay | Admitting: Cardiology

## 2020-10-19 DIAGNOSIS — Z03818 Encounter for observation for suspected exposure to other biological agents ruled out: Secondary | ICD-10-CM | POA: Diagnosis not present

## 2020-10-26 DIAGNOSIS — Z03818 Encounter for observation for suspected exposure to other biological agents ruled out: Secondary | ICD-10-CM | POA: Diagnosis not present

## 2020-11-02 DIAGNOSIS — H26492 Other secondary cataract, left eye: Secondary | ICD-10-CM | POA: Diagnosis not present

## 2020-11-03 ENCOUNTER — Telehealth: Payer: Self-pay | Admitting: Internal Medicine

## 2020-11-03 NOTE — Progress Notes (Signed)
  Chronic Care Management   Note  11/03/2020 Name: LIANY MUMPOWER MRN: [id number] DOB: 1950-12-01  Laural Roes Oyer is a 70 y.o. year old female who is a primary care patient of [name], Neta Mends, MD. I reached out to [employer] by phone today in response to a referral sent by Ms. Milla D Selmer's PCP, [name], Neta Mends, MD.   Ms. Vasey was given information about Chronic Care Management services today including:  1. CCM service includes personalized support from designated clinical staff supervised by her physician, including individualized plan of care and coordination with other care providers 2. 24/7 contact phone numbers for assistance for urgent and routine care needs. 3. Service will only be billed when office clinical staff spend 20 minutes or more in a month to coordinate care. 4. Only one practitioner may furnish and bill the service in a calendar month. 5. The patient may stop CCM services at any time (effective at the end of the month) by phone call to the office staff.   Patient agreed to services and verbal consent obtained.   Follow up plan:   Carley Perdue [employer] Scheduler

## 2020-11-04 DIAGNOSIS — H04123 Dry eye syndrome of bilateral lacrimal glands: Secondary | ICD-10-CM | POA: Diagnosis not present

## 2020-11-04 DIAGNOSIS — H16212 Exposure keratoconjunctivitis, left eye: Secondary | ICD-10-CM | POA: Diagnosis not present

## 2020-11-05 ENCOUNTER — Ambulatory Visit: Payer: Medicare Other

## 2020-11-05 DIAGNOSIS — H35371 Puckering of macula, right eye: Secondary | ICD-10-CM | POA: Diagnosis not present

## 2020-11-05 DIAGNOSIS — H04123 Dry eye syndrome of bilateral lacrimal glands: Secondary | ICD-10-CM | POA: Diagnosis not present

## 2020-11-05 DIAGNOSIS — Z9889 Other specified postprocedural states: Secondary | ICD-10-CM | POA: Diagnosis not present

## 2020-11-05 DIAGNOSIS — H0102A Squamous blepharitis right eye, upper and lower eyelids: Secondary | ICD-10-CM | POA: Diagnosis not present

## 2020-11-05 DIAGNOSIS — H16212 Exposure keratoconjunctivitis, left eye: Secondary | ICD-10-CM | POA: Diagnosis not present

## 2020-11-05 DIAGNOSIS — H0102B Squamous blepharitis left eye, upper and lower eyelids: Secondary | ICD-10-CM | POA: Diagnosis not present

## 2020-11-05 DIAGNOSIS — H353131 Nonexudative age-related macular degeneration, bilateral, early dry stage: Secondary | ICD-10-CM | POA: Diagnosis not present

## 2020-11-05 DIAGNOSIS — Z961 Presence of intraocular lens: Secondary | ICD-10-CM | POA: Diagnosis not present

## 2020-11-08 ENCOUNTER — Telehealth: Payer: Self-pay

## 2020-11-08 DIAGNOSIS — H0102B Squamous blepharitis left eye, upper and lower eyelids: Secondary | ICD-10-CM | POA: Diagnosis not present

## 2020-11-08 DIAGNOSIS — I480 Paroxysmal atrial fibrillation: Secondary | ICD-10-CM

## 2020-11-08 DIAGNOSIS — H0102A Squamous blepharitis right eye, upper and lower eyelids: Secondary | ICD-10-CM | POA: Diagnosis not present

## 2020-11-08 DIAGNOSIS — H16212 Exposure keratoconjunctivitis, left eye: Secondary | ICD-10-CM | POA: Diagnosis not present

## 2020-11-08 DIAGNOSIS — I10 Essential (primary) hypertension: Secondary | ICD-10-CM

## 2020-11-08 DIAGNOSIS — H04123 Dry eye syndrome of bilateral lacrimal glands: Secondary | ICD-10-CM | POA: Diagnosis not present

## 2020-11-08 NOTE — Telephone Encounter (Signed)
-----   Message from Viona Gilmore, [facility] sent at 11/01/2020 11:54 AM EST ----- Regarding: CCM referral Hi,  Can you please place a CCM referral for [name], one of Dr. Velora Mediate patients?  Thanks, Maddie

## 2020-11-09 ENCOUNTER — Other Ambulatory Visit: Payer: Self-pay

## 2020-11-09 MED ORDER — METOPROLOL TARTRATE 25 MG PO TABS
25.0000 mg | ORAL_TABLET | Freq: Two times a day (BID) | ORAL | 3 refills | Status: DC
Start: 2020-11-09 — End: 2021-09-21

## 2020-11-09 NOTE — Progress Notes (Signed)
Virtual Visit via Telephone Note   This visit type was conducted due to national recommendations for restrictions regarding the COVID-19 Pandemic (e.g. social distancing) in an effort to limit this patient's exposure and mitigate transmission in our community.  Due to her co-morbid illnesses, this patient is at least at moderate risk for complications without adequate follow up.  This format is felt to be most appropriate for this patient at this time.  The patient did not have access to video technology/had technical difficulties with video requiring transitioning to audio format only (telephone).  All issues noted in this document were discussed and addressed.  No physical exam could be performed with this format.  Please refer to the patient's chart for her  consent to telehealth for [facility]  Evaluation Performed:  Follow-up visit  This visit type was conducted due to national recommendations for restrictions regarding the COVID-19 Pandemic (e.g. social distancing).  This format is felt to be most appropriate for this patient at this time.  All issues noted in this document were discussed and addressed.  No physical exam was performed (except for noted visual exam findings with Video Visits).  Please refer to the patient's chart (MyChart message for video visits and phone note for telephone visits) for the patient's consent to telehealth for [place]  Date:  11/11/2020   ID:  [name] [name], DOB [birth year], MRN [id number]  Patient Location:  [place] [address]   Provider location:     [address] Office [phone] Fax [phone]   PCP:  Burnis Medin, MD  Cardiologist:  Minus Breeding, MD  Electrophysiologist:  None   Chief Complaint: Follow-up for paroxysmal atrial fibrillation  History of Present Illness:    [name] [name] is a 70 y.o. female who presents via audio/video  conferencing for a telehealth visit today.  Patient verified DOB and address.  She has a PMH of aortic insufficiency, HTN, and paroxysmal atrial fibrillation.  She was diagnosed with atrial fibrillation 9/16 after it was observed on cardiac event monitor.  She is prescribed metoprolol, diltiazem, and flecainide for her A. fib.  A previous ETT 12/18 did not show QT prolongation or ventricular ectopy on flecainide.  Echocardiogram 7/21 showed an EF of 55-60%, G1 DD, mild MR, moderate AI, dilated ascending aortic measuring 41 mm.  She was seen by Dr. Percival Spanish 8/21.  She reported having occasional breakthrough A. fib however, she did not wish to increase her flecainide at that time.  When she was seen 9/21 she also reported having breakthrough atrial fibrillation.  Treatment options were discussed and her flecainide was increased to 100 mg twice daily.  She was seen in follow-up by Billey Chang PA-C on 10/21.  At that time she reported having 2 episodes of atrial fibrillation which were both very brief.  She remained in sinus rhythm.  Her medication regimen was continued.  It was discussed that her atrial fibrillation breakthrough became more frequent she would be referred to Dr. Rayann Heman.  Follow-up was planned for 3 months.  She is seen virtually today and states she has noticed more frequent episodes of breakthrough atrial fibrillation.  She has been using her Kardia monitor when she has these episodes and reports rates in the 90s and some episodes of SVT that are short-lived.  She reports that she occasionally takes an extra half tablet of metoprolol which helps convert her back to sinus  rhythm.  After episodes of atrial fibrillation she notices decreased energy and weakness for about 1 day.  She does not see any pattern with her episodes of atrial fibrillation.  We discussed the option of performing vagal maneuvers and I described vagal maneuvers.  I will refer her back to EP for further evaluation.  We will plan  on repeating her echocardiogram for her AI and aneurysm surveillance in 6 months.  I will ask her to avoid triggers for atrial fibrillation, have her increase her physical activity as tolerated, and have her follow-up in July 2022  Today she denies chest pain, shortness of breath, lower extremity edema, fatigue, melena, hematuria, hemoptysis, diaphoresis, weakness, presyncope, syncope, orthopnea, and PND.  The patient does not symptoms concerning for COVID-19 infection (fever, chills, cough, or new SHORTNESS OF BREATH).    Prior CV studies:   The following studies were reviewed today:  EKG 08/02/2020 Sinus bradycardia possible left atrial enlargement, LVH 53 bpm  Echocardiogram 05/10/2020 IMPRESSIONS    1. Left ventricular ejection fraction, by estimation, is 55 to 60%. The  left ventricle has normal function. The left ventricle has no regional  wall motion abnormalities. The left ventricular internal cavity size was  mildly dilated. Left ventricular  diastolic parameters are consistent with Grade I diastolic dysfunction  (impaired relaxation). The average left ventricular global longitudinal  strain is -19.6 %. The global longitudinal strain is normal.  2. Right ventricular systolic function is normal. The right ventricular  size is normal. There is normal pulmonary artery systolic pressure.  3. Left atrial size was mildly dilated.  4. The mitral valve is normal in structure. Mild mitral valve  regurgitation. No evidence of mitral stenosis.  5. The aortic valve is normal in structure. Aortic valve regurgitation is  moderate. No aortic stenosis is present.  6. Aortic dilatation noted. Aneurysm of the ascending aorta, measuring 41  mm.  7. The inferior vena cava is normal in size with greater than 50%  respiratory variability, suggesting right atrial pressure of 3 mmHg.   Past Medical History:  Diagnosis Date  . Allergic rhinitis   . Aortic insufficiency   . CAT SCRATCH  09/10/2009   Qualifier: Diagnosis of  By: Regis Bill MD, [name]   . Clavicle fracture 3/08   left  . Diverticulosis   . HTN (hypertension)   . Osteopenia   . Paroxysmal atrial fibrillation (HCC)   . Vision disturbance 03/13/2013   Negative neuro MRA MRI   Past Surgical History:  Procedure Laterality Date  . ABDOMINAL HYSTERECTOMY     pt denies 04/15/14  . APPENDECTOMY    . LAPAROSCOPIC SALPINGO OOPHERECTOMY Right   . TONSILLECTOMY AND ADENOIDECTOMY       Current Meds  Medication Sig  . acetaminophen (TYLENOL) 500 MG tablet Take 500 mg by mouth as needed.  [name] alendronate (FOSAMAX) 70 MG tablet TAKE 1 TABLET ONCE A WEEK  WITH A FULL GLASS OF WATER  ON AN EMPTY STOMACH  . Calcium Carbonate-Vitamin D (CALCIUM PLUS VITAMIN D PO) Take 1 tablet by mouth daily.  [name] diltiazem (TIAZAC) 300 MG 24 hr capsule Take 300 mg by mouth daily.  [name] ELIQUIS 5 MG TABS tablet TAKE 1 TABLET BY MOUTH  TWICE DAILY  . flecainide (TAMBOCOR) 100 MG tablet Take 1 tablet (100 mg total) by mouth 2 (two) times daily.  [name] LORazepam (ATIVAN) 0.5 MG tablet Take 1 tablet by mouth as needed.  . Loteprednol Etabonate (EYSUVIS) 0.25 % SUSP Apply  1 drop to eye 3 (three) times daily. Until gone  . metoprolol tartrate (LOPRESSOR) 25 MG tablet Take 1 tablet (25 mg total) by mouth 2 (two) times daily.  . Multiple Vitamins-Minerals (PRESERVISION AREDS PO) Take 1 tablet by mouth 2 (two) times daily.  . [DISCONTINUED] valACYclovir (VALTREX) 1000 MG tablet Take 1 tablet (1,000 mg total) by mouth 3 (three) times daily.     Allergies:   Codeine phosphate, Demerol [meperidine], Meperidine hcl, Meperidine hcl, and Sulfa antibiotics   Social History   Tobacco Use  . Smoking status: Never Smoker  . Smokeless tobacco: Never Used  Substance Use Topics  . Alcohol use: No    Comment: never  . Drug use: No     Family Hx: The patient's family history includes Coronary artery disease (age of onset: 70) in her father; Esophageal cancer in  her paternal grandmother; Hyperlipidemia in her mother; Scleroderma in her mother.  ROS:   Please see the history of present illness.     All other systems reviewed and are negative.   Labs/Other Tests and Data Reviewed:    Recent Labs: 05/31/2020: Hemoglobin 14.4; Platelets 231 09/28/2020: BUN 21; Creat 0.82; Potassium 4.4; Sodium 140; TSH 0.82   Recent Lipid Panel Lab Results  Component Value Date/Time   CHOL 226 (H) 05/17/2018 09:12 AM   TRIG 84.0 05/17/2018 09:12 AM   HDL 79.30 05/17/2018 09:12 AM   CHOLHDL 3 05/17/2018 09:12 AM   LDLCALC 129 (H) 05/17/2018 09:12 AM   LDLDIRECT 131.0 09/03/2009 08:13 AM    Wt Readings from Last 3 Encounters:  11/11/20 112 lb (50.8 kg)  09/29/20 112 lb (50.8 kg)  09/28/20 112 lb 12.8 oz (51.2 kg)     Exam:    Vital Signs:  BP 122/66   Pulse 63   Wt 112 lb (50.8 kg)   BMI 18.64 kg/m    Well nourished, well developed female in no  acute distress.   ASSESSMENT & PLAN:    1.  Paroxysmal atrial fibrillation- has had more episodes of paroxysmal atrial fibrillation.  She reports they are becoming more frequent reports no side effects with her medications. Continue flecainide,diltiazem,metoprolol-May take extra 12.5 mg of metoprolol as needed Heart healthy low-sodium diet Increase physical activity as tolerated Avoid triggers caffeine, chocolate, EtOH, dehydration etc. Refer to Dr. Clair Gulling  Essential hypertension- BP today 122/66.  Well-controlled at home. Continue metoprolol, diltiazem  heart healthy low-sodium diet Increase physical activity as tolerated  Aortic insufficiency- no increased DOE or activity intolerance.  Echocardiogram 7/21 showed moderate aortic valve regurgitation with no aortic stenosis. Recommend repeat echocardiogram 7/22  Thoracic aortic aneurysm- no recent episodes of chest pain or back discomfort.  Echocardiogram 7/21 showed 41 mm aortic dilation.  Blood pressure remains well controlled Recommend repeat  echocardiogram 7/22   Disposition: Follow-up with Dr. Percival Spanish after echocardiogram around 6 months.  COVID-19 Education: The signs and symptoms of COVID-19 were discussed with the patient and how to seek care for testing (follow up with PCP or arrange E-visit).  The importance of social distancing was discussed today.  Patient Risk:   After full review of this patients clinical status, I feel that they are at least moderate risk at this time.  Time:   Today, I have spent 32 minutes with the patient with telehealth technology discussing atrial fibrillation, vagal maneuvers, medication, electrophysiology.  I spent greater than 20 minutes prior to her visit reviewing her past medical history, occasions, and prior cardiac test.  Medication Adjustments/Labs and Tests Ordered: Current medicines are reviewed at length with the patient today.  Concerns regarding medicines are outlined above.   Tests Ordered: No orders of the defined types were placed in this encounter.  Medication Changes: No orders of the defined types were placed in this encounter.   Disposition:  in 6 month(s)  Signed, Jossie Ng. Shalimar Mcclain NP-C    06/03/2019 11:58 AM    [address] [place] [address] Office [phone] Fax [phone]

## 2020-11-11 ENCOUNTER — Ambulatory Visit: Payer: Medicare Other

## 2020-11-11 ENCOUNTER — Telehealth (INDEPENDENT_AMBULATORY_CARE_PROVIDER_SITE_OTHER): Payer: Medicare Other | Admitting: General Practice

## 2020-11-11 ENCOUNTER — Encounter: Payer: Self-pay | Admitting: General Practice

## 2020-11-11 ENCOUNTER — Telehealth: Payer: Self-pay | Admitting: Pharmacist

## 2020-11-11 VITALS — BP 122/66 | HR 63 | Wt 112.0 lb

## 2020-11-11 DIAGNOSIS — I712 Thoracic aortic aneurysm, without rupture, unspecified: Secondary | ICD-10-CM

## 2020-11-11 DIAGNOSIS — I480 Paroxysmal atrial fibrillation: Secondary | ICD-10-CM | POA: Diagnosis not present

## 2020-11-11 DIAGNOSIS — I10 Essential (primary) hypertension: Secondary | ICD-10-CM | POA: Diagnosis not present

## 2020-11-11 DIAGNOSIS — I351 Nonrheumatic aortic (valve) insufficiency: Secondary | ICD-10-CM | POA: Diagnosis not present

## 2020-11-11 NOTE — Chronic Care Management (AMB) (Signed)
Chronic Care Management Pharmacy Assistant   Name: [name] [name]  MRN: [id number] DOB: 07-07-1951  Reason for Encounter: Medication Review/Initial Questions for Pharmacist visit on 11-12-2020  Patient Questions: 1. Have you seen any other providers since your last visit?  . Eye doctor 2. Any changes in your medications or health?  . Loteprednol Etabonate (EYSUVIS) 0.25 % SUSP 3. Any side effects from any medications? No 4. Do you have any symptoms or problems not managed by your medications?  [name] A fib. She is seeing a cardiologist for this. 5. Any concerns about your health right now? No 6. Has your provider asked that you check blood pressure, blood sugar, or follow a special diet at home?  [name] She takes her blood pressure daily 7. Do you get any type of exercise regularly?  [name] She walks three to four times a week and takes Zumba classes 8. Can you think of a goal you would like to reach for your health? . Getting her A-fib under control 9. Do you have any problems getting your medications? No 10. Is there anything that you would like to discuss during the appointment? No  The patient was asked to please bring medications, blood pressure/ blood sugar log, and supplements to her appointment.   PCP : Burnis Medin, MD  Allergies:   Allergies  Allergen Reactions  . Codeine Phosphate     drowsy  . Demerol [Meperidine] Nausea Only  . Meperidine Hcl Nausea Only  . Meperidine Hcl   . Sulfa Antibiotics     Rash    Medications: Outpatient Encounter Medications as of 11/11/2020  Medication Sig  . acetaminophen (TYLENOL) 500 MG tablet Take 500 mg by mouth as needed.  [name] alendronate (FOSAMAX) 70 MG tablet TAKE 1 TABLET ONCE A WEEK  WITH A FULL GLASS OF WATER  ON AN EMPTY STOMACH  . Calcium Carbonate-Vitamin D (CALCIUM PLUS VITAMIN D PO) Take 1 tablet by mouth daily.   [name] CARTIA XT 300 MG 24 hr capsule TAKE 1 CAPSULE BY MOUTH  DAILY  . Cholecalciferol (VITAMIN D3) 1000 units CAPS  Take 2 capsules by mouth daily.   [name] ELIQUIS 5 MG TABS tablet TAKE 1 TABLET BY MOUTH  TWICE DAILY  . flecainide (TAMBOCOR) 100 MG tablet Take 1 tablet (100 mg total) by mouth 2 (two) times daily.  [name] LORazepam (ATIVAN) 0.5 MG tablet Take 1 tablet by mouth as needed.  . metoprolol tartrate (LOPRESSOR) 25 MG tablet Take 1 tablet (25 mg total) by mouth 2 (two) times daily.  . Multiple Vitamins-Minerals (PRESERVISION AREDS PO) Take 1 tablet by mouth 2 (two) times daily.   [name] triamcinolone (KENALOG) 0.1 % Apply 1 application topically 2 (two) times daily. To  Itchy rash.  . valACYclovir (VALTREX) 1000 MG tablet Take 1 tablet (1,000 mg total) by mouth 3 (three) times daily.   No facility-administered encounter medications on file as of 11/11/2020.    Current Diagnosis: Patient Active Problem List   Diagnosis Date Noted  . Posterior capsular opacification non visually significant of left eye 07/19/2020  . Left epiretinal membrane 07/19/2020  . Right epiretinal membrane 07/19/2020  . Low back pain 07/14/2020  . Pain in left leg 07/14/2020  . Educated about COVID-19 virus infection 05/30/2020  . Chronic pain of right knee 03/31/2019  . Essential hypertension 06/30/2017  . Age related osteoporosis 10/19/2016  . Paroxysmal atrial fibrillation ([place]) 09/27/2015  . Hemorrhage of rectum and anus 04/16/2014  . Special  screening for malignant neoplasms, colon 04/16/2014  . Hemorrhoids, unspecified hemorrhoid type 04/16/2014  . Plantar wart of left foot 05/14/2013  . Foot callus 05/14/2013  . Bilateral bunions 03/13/2013  . Hypertrophic toenail 03/13/2013  . Vision disturbance 03/13/2013  . Post-nasal drainage 05/05/2012  . Aortic insufficiency 01/16/2012  . Macular degeneration 10/12/2011  . ANXIETY, SITUATIONAL 05/08/2008  . Vitamin D insufficiency 05/22/2007  . ELEVATED BLOOD PRESSURE WITHOUT DIAGNOSIS OF HYPERTENSION 05/22/2007  . Allergic rhinitis due to allergen 03/28/2007    Goals  Addressed   None     Follow-Up:  Pharmacist Review   Maia Breslow, [place] Assistant [phone]

## 2020-11-11 NOTE — Patient Instructions (Addendum)
Medication Instructions:  The current medical regimen is effective;  continue present plan and medications as directed. Please refer to the Current Medication list given to you today. *If you need a refill on your cardiac medications before your next appointment, please call your pharmacy*  Lab Work: NONE  Testing/Procedures: Echocardiogram (July 2022)- Your physician has requested that you have an echocardiogram. Echocardiography is a painless test that uses sound waves to create images of your heart. It provides your doctor with information about the size and shape of your heart and how well your heart's chambers and valves are working. This procedure takes approximately one hour. There are no restrictions for this procedure. This will be performed at our [facility] location - [address].  REFERRAL TO EP-DR ALLRED-SOMEONE WILL CALL YOU TO SCHEDULE  Special Instructions Please try to avoid these triggers:  Do not use any products that have nicotine or tobacco in them. These include cigarettes, e-cigarettes, and chewing tobacco. If you need help quitting, ask your doctor.  Eat heart-healthy foods. Talk with your doctor about the right eating plan for you.  Exercise regularly as told by your doctor.  Do not drink alcohol, Caffeine or chocolate.  Lose weight if you are overweight.  Do not use drugs, including cannabis   PLEASE INCREASE PHYSICAL ACTIVITY AS TOLERATED  PLEASE DO VAGAL MANEUVERS AS NEEDED-TRY: Bearing down (as if you are trying to have a bowel movement), you can try to cough (Hard) multiple times, splash very cold water(or bag of ice) on your face, Hold your nose, close your mouth, and try to blow the air out.   Follow-Up: Your next appointment:  6 month(s) In Person with Minus Breeding, MD OR IF UNAVAILABLE JESSE CLEAVER, FNP-C   Please call our office 2 months in advance to schedule this appointment   At [facility], you and your health needs are  our priority.  As part of our continuing mission to provide you with exceptional heart care, we have created designated Provider Care Teams.  These Care Teams include your primary Cardiologist (physician) and Advanced Practice Providers (APPs -  Physician Assistants and Nurse Practitioners) who all work together to provide you with the care you need, when you need it.

## 2020-11-12 ENCOUNTER — Other Ambulatory Visit: Payer: Self-pay

## 2020-11-12 ENCOUNTER — Ambulatory Visit: Payer: Medicare Other | Admitting: Pharmacist

## 2020-11-12 DIAGNOSIS — I10 Essential (primary) hypertension: Secondary | ICD-10-CM

## 2020-11-12 DIAGNOSIS — I480 Paroxysmal atrial fibrillation: Secondary | ICD-10-CM

## 2020-11-12 NOTE — Chronic Care Management (AMB) (Signed)
Chronic Care Management Pharmacy  Name: [name] [name]  MRN: [id number] DOB: [birth year]  Initial Planning Appointment: completed 11/12/19  Initial Questions: 1. Have you seen any other providers since your last visit? n/a 2. Any changes in your medicines or health? No   Chief Complaint/ HPI  [name] [name],  70 y.o. , female presents for their Initial CCM visit with the clinical pharmacist In office.  PCP : Burnis Medin, MD  Their chronic conditions include: HTN, Afib, osteoporosis, anixety, macular degeneration  Office Visits: -09/29/20 Shanon Ace, MD: Patient presented for a rash. Prescribed triamcinolone and Valtrex.  01/16/20 Shanon Ace, MD: Patient presented for follow up for abnormal screening.  Consult Visit: -09/28/20 Philemon Kingdom, MD (endo): Patient presented for osteoporosis follow up. No changes made.  -08/02/20 Almyra Deforest, PA (cardiology): Patient presented for Afib follow up. Follow up in 3 months.  -07/28/20 Joni Fears, MD (ortho): Patient presented with left leg pain follow up.  -07/23/20 Almyra Deforest, PA (cardiology): Patient presented for Afib follow up. Flecainide decreased to 1 tablet BID.  07/19/20 Deloria Lair, MD (ophthalmology): Patient presented for eye exam.  07/14/20 Joni Fears, MD (ortho): Patient presented with left leg pain follow up.  -05/31/20 Minus Breeding, MD (cardiology): Patient presented for Afib follow up. No changes made.  Medications: Outpatient Encounter Medications as of 11/12/2020  Medication Sig  . acetaminophen (TYLENOL) 500 MG tablet Take 500 mg by mouth as needed.  [name] alendronate (FOSAMAX) 70 MG tablet TAKE 1 TABLET ONCE A WEEK  WITH A FULL GLASS OF WATER  ON AN EMPTY STOMACH  . Calcium Carbonate-Vitamin D (CALCIUM PLUS VITAMIN D PO) Take 1 tablet by mouth daily.  [name] diltiazem (TIAZAC) 300 MG 24 hr capsule Take 300 mg by mouth daily.  [name] ELIQUIS 5 MG TABS tablet TAKE 1 TABLET BY MOUTH  TWICE DAILY  . flecainide (TAMBOCOR) 100  MG tablet Take 1 tablet (100 mg total) by mouth 2 (two) times daily.  [name] LORazepam (ATIVAN) 0.5 MG tablet Take 1 tablet by mouth as needed.  . Loteprednol Etabonate (EYSUVIS) 0.25 % SUSP Place 1 drop into the left eye 3 (three) times daily. Until gone  . metoprolol tartrate (LOPRESSOR) 25 MG tablet Take 1 tablet (25 mg total) by mouth 2 (two) times daily.  . Multiple Vitamins-Minerals (PRESERVISION AREDS PO) Take 1 tablet by mouth 2 (two) times daily.   No facility-administered encounter medications on file as of 11/12/2020.   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 Patient lives with her husband but notes that they don't have any family nearby.   She reports her diet is somewhat heart healthy and she doesn't drink or smoke. She cut out chocolate in the afternoons and substituted it for yogurt with Afib. She is conscious of her weight. For meat, she doesn't eat much red meat prefers chicken and salmon.  She does exercise at the [facility] in [place] classes and walking. The Zumba class is an hour and she tries to walk outside when she can.  Patient does not sleep well and when she wakes up, it can be hard to go back to sleep. She tries to stick with a schedule and go to bed before 10 but she can't always shut off her mind. She reports this has been worse in recent months. She doesn't take naps during the day and tries to avoid screens before bed.  Patient reports her medications are working pretty well and hasn't had any particular reaction to one over the other. She has noticed some blotchy skin around her legs for the past several years but is unsure if this is related to medicines or not.  Current Diagnosis/Assessment:  Goals Addressed            This Visit's Progress   . Pharmacy care plan       CARE PLAN ENTRY (see longitudinal plan of care for additional care plan information)  Current Barriers:  . Chronic  Disease Management support, education, and care coordination needs related to Hypertension, Atrial Fibrillation, and Osteoporosis   Hypertension BP Readings from Last 3 Encounters:  11/11/20 122/66  09/29/20 140/72  09/28/20 130/78   . Pharmacist Clinical Goal(s): o Over the next 90 days, patient will work with PharmD and providers to achieve BP goal <130/80 . Current regimen:   Diltiazem 300 mg 1 capsule daily  Metoprolol tartrate 25 mg 1 tablet twice daily . Interventions: o Discussed DASH eating plan recommendations: . Emphasizes vegetables, fruits, and whole-grains . Includes fat-free or low-fat dairy products, fish, poultry, beans, nuts, and vegetable oils . Limits foods that are high in saturated fat. These foods include fatty meats, full-fat dairy products, and tropical oils such as coconut, palm kernel, and palm oils. . Limits sugar-sweetened beverages and sweets . Limiting sodium intake to < 1500 mg/day o Discussed the importance of regularly monitoring blood pressure at home  . Patient self care activities - Over the next 90 days, patient will: o Check BP weekly, document, and provide at future appointments o Ensure daily salt intake < 2300 mg/day  Afib . Pharmacist Clinical Goal(s): o Over the next 90 days, patient will work with PharmD and providers to maintain HR < 110 beats per minute . Current regimen:  o Metoprolol tartrate 25 mg 1 tablet twice daily . Eliquis 5 mg 1 tablet twice daily . Flecainide 100 mg 1 tablet twice daily . Interventions: o Discussed monitoring for signs of bleeding such as unexplained and excessive bleeding from a cut or injury, easy or excessive bruising, blood in urine or stools, and nosebleeds without a known cause . Patient self care activities - Over the next 90 days, patient will: o Continue current medications  Osteoporosis Last vitamin D Lab Results  Component Value Date   VD25OH 44.3 09/28/2020    . Pharmacist Clinical  Goal(s): o Over the next 90 days, patient will work with PharmD and providers to improve bone density and prevent fractures . Current regimen:  . Fosamax 70 mg 1 tablet weekly - Saturday . Calcium 600 mg daily . Vitamin D 2400 units daily . Interventions: o Discussed recommendations for [phone] units of vitamin D daily. And 1200 mg of calcium daily from dietary and supplemental sources . Patient self care activities - Over the next 90 days, patient will: o Confirm doses of calcium and vitamin D with Dr. Cruzita Lederer o Continue current medications  Medication management . Pharmacist Clinical Goal(s): o Over the next 90 days, patient will work with PharmD and providers to maintain optimal medication adherence . [facility] . Interventions o Comprehensive medication review performed. o Continue current medication management strategy . Patient self care activities - Over the next 90 days, patient will: o Focus on medication adherence by moving calcium and vitamin D to later in the day o Take medications as prescribed o Report any questions or concerns to PharmD and/or provider(s)  Initial goal documentation       SDOH Interventions   Flowsheet Row Most Recent Value  SDOH Interventions   Financial Strain Interventions Intervention Not Indicated  Transportation Interventions Intervention Not Indicated      Hypertension   BP goal is:  <130/80  Office blood pressures are  BP Readings from Last 3 Encounters:  11/11/20 122/66  09/29/20 140/72  09/28/20 130/78   Patient checks BP at home infrequently Patient home BP readings are ranging: n/a  Patient has failed these meds in the past: n/a Patient is currently controlled on the following medications:   Diltiazem 300 mg 1 capsule daily  Metoprolol tartrate 25 mg 1 tablet twice daily  We discussed diet and exercise extensively  -DASH eating plan recommendations: . Emphasizes vegetables, fruits, and  whole-grains . Includes fat-free or low-fat dairy products, fish, poultry, beans, nuts, and vegetable oils . Limits foods that are high in saturated fat. These foods include fatty meats, full-fat dairy products, and tropical oils such as coconut, palm kernel, and palm oils. . Limits sugar-sweetened beverages and sweets . Limiting sodium intake to < 1500 mg/day -Discussed the importance of monitoring blood pressure at home   Plan Patient will start checking blood pressure at home. Continue current medications     AFIB   Patient is currently rhythm controlled. Office heart rates are  Pulse Readings from Last 3 Encounters:  11/11/20 63  09/29/20 60  09/28/20 (!) 54   HR: 60s  CHA2DS2-VASc Score =   3 The patient's score is based upon: sex, age, HTN    Patient has failed these meds in past: none Patient is currently controlled on the following medications:  . Eliquis 5 mg 1 tablet BID . Flecainide 100 mg 1 tablet BID . Metoprolol twice daily  We discussed:  monitoring HR along with BP; monitoring for signs of bleeding such as unexplained and excessive bleeding from a cut or injury, easy or excessive bruising, blood in urine or stools, and nosebleeds without a known cause; patient uses [place] mobile app for an EKG reading on the go   Plan  Continue current medications  Hyperlipidemia   LDL goal < 100  Last lipids Lab Results  Component Value Date   CHOL 226 (H) 05/17/2018   HDL 79.30 05/17/2018   LDLCALC 129 (H) 05/17/2018   LDLDIRECT 131.0 09/03/2009   TRIG 84.0 05/17/2018   CHOLHDL 3 05/17/2018   Hepatic Function Latest Ref Rng & Units 05/17/2018 09/14/2016 10/15/2014  Total Protein 6.0 - 8.3 g/dL 7.4 7.3 7.1  Albumin 3.5 - 5.2 g/dL 4.5 4.5 4.1  AST 0 - 37 U/L _0 ALT 0 - 35 U/L _1 Alk Phosphatase 39 - 117 U/L 37(L) 42 40  Total Bilirubin 0.2 - 1.2 mg/dL 0.7 0.8 0.6  Bilirubin, Direct 0.0 - 0.3 mg/dL 0.1 0.1 0.0     The 10-year ASCVD risk score  Mikey Bussing DC Jr., et al., 2013) is: 8.8%   Values used to calculate the score:     Age: 85 years     Sex: Female     Is Non-Hispanic African American: No     Diabetic: No     Tobacco smoker: No     Systolic Blood Pressure: 270 mmHg     Is BP treated: Yes     HDL Cholesterol: 79.3 mg/dL     Total Cholesterol: 226 mg/dL   Patient has failed these meds in past: none Patient  is currently uncontrolled on the following medications:  . No medications  We discussed:  diet and exercise extensively  Plan Consider moderate intensity statin for intermediate risk. Continue control with diet and exercise  Osteoporosis   Last DEXA Scan: 08/2020   T-Score femoral neck: LFN -2.3, RFN -2.5  T-Score total hip: n/a  T-Score lumbar spine: -1.9  T-Score forearm radius: n/a  10-year probability of major osteoporotic fracture: n/a  10-year probability of hip fracture: n/a  Vit D, 25-Hydroxy  Date Value Ref Range Status  09/28/2020 44.3 30.0 - 100.0 ng/mL Final    Comment:    Vitamin D deficiency has been defined by the Institute of Medicine and an Endocrine Society practice guideline as a level of serum 25-OH vitamin D less than 20 ng/mL (1,2). The Endocrine Society went on to further define vitamin D insufficiency as a level between 21 and 29 ng/mL (2). 1. IOM (Institute of Medicine). 2010. Dietary reference    intakes for calcium and D. [place]: The    [employer]. 2. Holick MF, B[place], Bischoff-Ferrari HA, et al.    Evaluation, treatment, and prevention of vitamin D    deficiency: an Endocrine Society clinical practice    guideline. JCEM. 2011 Jul; 96(7):1911-30.      Patient is a candidate for pharmacologic treatment due to T-Score < -2.5 in femoral neck  Patient has failed these meds in past: Fosamax (completed 5 years), Prolia (cost) Patient is currently controlled on the following medications:  . Fosamax 70 mg 1 tablet weekly - Saturday . Calcium 600 mg  daily . Vitamin D 2400 units daily  We discussed:  Recommend [phone] units of vitamin D daily. Recommend 1200 mg of calcium daily from dietary and supplemental sources. Counseled on oral bisphosphonate administration: take in the morning, 30 minutes prior to food with 6-8 oz of water. Do not lie down for at least 30 minutes after taking. Recommend weight-bearing and muscle strengthening exercises for building and maintaining bone density. ; discussed foods with calcium   Plan Patient will clarify with Dr. Cruzita Lederer for the amount of calcium and vitamin D she should be taking daily. Continue current medications   Anxiety for flying   Patient has failed these meds in past: none Patient is currently controlled on the following medications:  . Lorazepam 0.5 mg 1 tablet PRN  We discussed: limiting use and patient only uses before going on an airplane  Plan  Continue current medications   Macular degeneration   Patient is currently controlled on the following medications:  . Preservision areds 1 tablet twice daily . Eysuvis 0.25% apply 1 drop three times daily  Plan  Continue current medications   Miscellaneous   Patient is currently on the following medications:  . Tylenol 500 mg 1 tablet PRN  Plan  Continue current medications  Vaccines   Reviewed and discussed patient's vaccination history.    Immunization History  Administered Date(s) Administered  . Fluad Quad(high Dose 65+) 09/02/2019  . Influenza Split 08/25/2011, 09/03/2013, 08/03/2014  . Influenza Whole 08/25/2009, 07/30/2010, 07/30/2012  . Influenza, High Dose Seasonal PF 07/30/2020  . Influenza,inj,Quad PF,6+ Mos 08/17/2015, 07/18/2016, 07/30/2017  . Influenza-Unspecified 08/20/2018, 08/09/2019  . PFIZER(Purple Top)SARS-COV-2 Vaccination 12/05/2019, 12/30/2019, 08/17/2020  . Pneumococcal Conjugate-13 03/05/2018  . Pneumococcal Polysaccharide-23 02/02/2020  . Pneumococcal-Unspecified 12/02/2019  . Td  05/22/2007  . Zoster 10/02/2013  . Zoster Recombinat (Shingrix) 03/22/2018, 07/28/2018   Patient reported [facility] (Td).  Plan  Patient is up to date on all immunizations.  Medication Management   [id number] [place] [address]: [phone]: [phone] [address] [place] [address]Phone: [phone]: [phone] [address] [address] [place]Phone: [phone]: [phone] Uses pill box? Yes - weekly  - recommended AM/PM Pt endorses 100% compliance  We discussed: Current pharmacy is preferred with insurance plan and patient is satisfied with pharmacy services   Plan  Continue current medication management strategy   Follow up: 3 month phone visit  MJeni Salles PharmD BManorhavenPharmacist [phone]

## 2020-11-15 ENCOUNTER — Encounter: Payer: Self-pay | Admitting: Internal Medicine

## 2020-11-15 NOTE — Telephone Encounter (Signed)
Late entry: 11-13-2019, reviewed with pt

## 2020-11-16 DIAGNOSIS — Z961 Presence of intraocular lens: Secondary | ICD-10-CM | POA: Diagnosis not present

## 2020-11-16 DIAGNOSIS — H0102A Squamous blepharitis right eye, upper and lower eyelids: Secondary | ICD-10-CM | POA: Diagnosis not present

## 2020-11-16 DIAGNOSIS — H04123 Dry eye syndrome of bilateral lacrimal glands: Secondary | ICD-10-CM | POA: Diagnosis not present

## 2020-11-16 DIAGNOSIS — H0102B Squamous blepharitis left eye, upper and lower eyelids: Secondary | ICD-10-CM | POA: Diagnosis not present

## 2020-11-16 DIAGNOSIS — H16212 Exposure keratoconjunctivitis, left eye: Secondary | ICD-10-CM | POA: Diagnosis not present

## 2020-11-29 NOTE — Patient Instructions (Addendum)
Hi [name] [name],  It was so lovely to get to meet you in person! I see that you were able to message Dr. Cruzita Lederer to make sure you are on the correct doses of calcium and vitamin D. I also added this link to calcium foods that I find helpful when trying to figure out how much calcium is in certain foods we eat. I also think it would be beneficial for you to check your blood pressure at home as we discussed.  You also may want to look into getting the timed release melatonin as we discussed to see if this helps with sleep.  I also didn't find anything specific about your medications causing blotchy skin. It is certainly possible, but if it didn't start immediately after starting a medication and has been more gradual, I am not sure it is medication induced. I wish I could help more!  Please give me a call if you have any questions or need anything before our follow up!  Best, Maddie  [name], PharmD [facility] Clinical Pharmacist [place] [facility] [place]   Visit Information  Goals Addressed            This Visit's Progress   . Pharmacy care plan       CARE PLAN ENTRY (see longitudinal plan of care for additional care plan information)  Current Barriers:  . Chronic Disease Management support, education, and care coordination needs related to Hypertension, Atrial Fibrillation, and Osteoporosis   Hypertension BP Readings from Last 3 Encounters:  11/11/20 122/66  09/29/20 140/72  09/28/20 130/78   . Pharmacist Clinical Goal(s): o Over the next 90 days, patient will work with PharmD and providers to achieve BP goal <130/80 . Current regimen:   Diltiazem 300 mg 1 capsule daily  Metoprolol tartrate 25 mg 1 tablet twice daily . Interventions: o Discussed DASH eating plan recommendations: . Emphasizes vegetables, fruits, and whole-grains . Includes fat-free or low-fat dairy products, fish, poultry, beans, nuts, and vegetable oils . Limits foods that are high in  saturated fat. These foods include fatty meats, full-fat dairy products, and tropical oils such as coconut, palm kernel, and palm oils. . Limits sugar-sweetened beverages and sweets . Limiting sodium intake to < 1500 mg/day o Discussed the importance of regularly monitoring blood pressure at home  . Patient self care activities - Over the next 90 days, patient will: o Check BP weekly, document, and provide at future appointments o Ensure daily salt intake < 2300 mg/day  Afib . Pharmacist Clinical Goal(s): o Over the next 90 days, patient will work with PharmD and providers to maintain HR < 110 beats per minute . Current regimen:  o Metoprolol tartrate 25 mg 1 tablet twice daily . Eliquis 5 mg 1 tablet twice daily . Flecainide 100 mg 1 tablet twice daily . Interventions: o Discussed monitoring for signs of bleeding such as unexplained and excessive bleeding from a cut or injury, easy or excessive bruising, blood in urine or stools, and nosebleeds without a known cause . Patient self care activities - Over the next 90 days, patient will: o Continue current medications  Osteoporosis Last vitamin D Lab Results  Component Value Date   VD25OH 44.3 09/28/2020    . Pharmacist Clinical Goal(s): o Over the next 90 days, patient will work with PharmD and providers to improve bone density and prevent fractures . Current regimen:  . Fosamax 70 mg 1 tablet weekly - Saturday . Calcium 600 mg daily . Vitamin D  2400 units daily . Interventions: o Discussed recommendations for [phone] units of vitamin D daily. And 1200 mg of calcium daily from dietary and supplemental sources . Patient self care activities - Over the next 90 days, patient will: o Confirm doses of calcium and vitamin D with Dr. Cruzita Lederer o Continue current medications  Medication management . Pharmacist Clinical Goal(s): o Over the next 90 days, patient will work with PharmD and providers to maintain optimal medication  adherence . [facility] . Interventions o Comprehensive medication review performed. o Continue current medication management strategy . Patient self care activities - Over the next 90 days, patient will: o Focus on medication adherence by moving calcium and vitamin D to later in the day o Take medications as prescribed o Report any questions or concerns to PharmD and/or provider(s)  Initial goal documentation        Ms. Kettering was given information about Chronic Care Management services today including:  1. CCM service includes personalized support from designated clinical staff supervised by her physician, including individualized plan of care and coordination with other care providers 2. 24/7 contact phone numbers for assistance for urgent and routine care needs. 3. Standard insurance, coinsurance, copays and deductibles apply for chronic care management only during months in which we provide at least 20 minutes of these services. Most insurances cover these services at 100%, however patients may be responsible for any copay, coinsurance and/or deductible if applicable. This service may help you avoid the need for more expensive face-to-face services. 4. Only one practitioner may furnish and bill the service in a calendar month. 5. The patient may stop CCM services at any time (effective at the end of the month) by phone call to the office staff.  Patient agreed to services and verbal consent obtained.   The patient verbalized understanding of instructions, educational materials, and care plan provided today and agreed to receive a mailed copy of patient instructions, educational materials, and care plan.  Telephone follow up appointment with pharmacy team member scheduled for: 3 months  Viona Gilmore, [facility]

## 2020-12-01 ENCOUNTER — Other Ambulatory Visit: Payer: Self-pay

## 2020-12-01 ENCOUNTER — Encounter: Payer: Self-pay | Admitting: Internal Medicine

## 2020-12-01 ENCOUNTER — Ambulatory Visit: Payer: Medicare Other | Admitting: Internal Medicine

## 2020-12-01 VITALS — BP 150/68 | HR 53 | Ht 65.0 in | Wt 112.4 lb

## 2020-12-01 DIAGNOSIS — I351 Nonrheumatic aortic (valve) insufficiency: Secondary | ICD-10-CM | POA: Diagnosis not present

## 2020-12-01 DIAGNOSIS — D6869 Other thrombophilia: Secondary | ICD-10-CM | POA: Diagnosis not present

## 2020-12-01 DIAGNOSIS — I10 Essential (primary) hypertension: Secondary | ICD-10-CM

## 2020-12-01 DIAGNOSIS — I4891 Unspecified atrial fibrillation: Secondary | ICD-10-CM

## 2020-12-01 DIAGNOSIS — I480 Paroxysmal atrial fibrillation: Secondary | ICD-10-CM

## 2020-12-01 NOTE — Progress Notes (Signed)
Electrophysiology Office Note   Date:  12/01/2020   ID:  [name] [name], [name] [name] [birth year], MRN [id number]  PCP:  Burnis Medin, MD  Cardiologist:  Dr Percival Spanish Primary Electrophysiologist: Thompson Grayer, MD    CC: afib   History of Present Illness: [name] [name] is a 70 y.o. female who presents today for electrophysiology evaluation.   He is referred by Dr Percival Spanish and [name] for EP consultation regarding afib.  The patient was diagnosed with afib in 2016.  She has done well with flecainide. She is on eliquis for stroke prevention.  Her afib has increased in frequency and duration.  She has palpitations and fatigue with her afib.  She finds it to be anxiety provoking.   Today, she denies symptoms of palpitations, chest pain, shortness of breath, orthopnea, PND, lower extremity edema, claudication, dizziness, presyncope, syncope, bleeding, or neurologic sequela. The patient is tolerating medications without difficulties and is otherwise without complaint today.    Past Medical History:  Diagnosis Date  . Allergic rhinitis   . Aortic insufficiency   . CAT SCRATCH 09/10/2009   Qualifier: Diagnosis of  By: Regis Bill MD, [name]   . Clavicle fracture 3/08   left  . Diverticulosis   . HTN (hypertension)   . Osteopenia   . Paroxysmal atrial fibrillation (HCC)   . Vision disturbance 03/13/2013   Negative neuro MRA MRI   Past Surgical History:  Procedure Laterality Date  . ABDOMINAL HYSTERECTOMY     pt denies 04/15/14  . APPENDECTOMY    . LAPAROSCOPIC SALPINGO OOPHERECTOMY Right   . TONSILLECTOMY AND ADENOIDECTOMY       Current Outpatient Medications  Medication Sig Dispense Refill  . acetaminophen (TYLENOL) 500 MG tablet Take 500 mg by mouth as needed.    [name] alendronate (FOSAMAX) 70 MG tablet TAKE 1 TABLET ONCE A WEEK  WITH A FULL GLASS OF WATER  ON AN EMPTY STOMACH 12 tablet 3  . Calcium Carbonate-Vitamin D (CALCIUM PLUS VITAMIN D PO) Take 1 tablet by mouth daily.    [name]  diltiazem (TIAZAC) 300 MG 24 hr capsule Take 300 mg by mouth daily.    [name] ELIQUIS 5 MG TABS tablet TAKE 1 TABLET BY MOUTH  TWICE DAILY 180 tablet 3  . flecainide (TAMBOCOR) 100 MG tablet Take 1 tablet (100 mg total) by mouth 2 (two) times daily. 180 tablet 3  . LORazepam (ATIVAN) 0.5 MG tablet Take 1 tablet by mouth as needed.    . Loteprednol Etabonate (EYSUVIS) 0.25 % SUSP Place 1 drop into the left eye 3 (three) times daily. Until gone    . metoprolol tartrate (LOPRESSOR) 25 MG tablet Take 1 tablet (25 mg total) by mouth 2 (two) times daily. 180 tablet 3  . Multiple Vitamins-Minerals (PRESERVISION AREDS PO) Take 1 tablet by mouth 2 (two) times daily.     No current facility-administered medications for this visit.    Allergies:   Codeine phosphate, Demerol [meperidine], Meperidine hcl, Meperidine hcl, and Sulfa antibiotics   Social History:  The patient  reports that she has never smoked. She has never used smokeless tobacco. She reports that she does not drink alcohol and does not use drugs.   Family History:  The patient's family history includes Coronary artery disease (age of onset: 93) in her father; Esophageal cancer in her paternal grandmother; Hyperlipidemia in her mother; Scleroderma in her mother.    ROS:  Please see the history of present illness.   All  other systems are personally reviewed and negative.    PHYSICAL EXAM: VS:  BP (!) 150/68   Pulse (!) 53   Ht 5\' 5"  (1.651 m)   Wt 112 lb 6.4 oz (51 kg)   SpO2 98%   BMI 18.70 kg/m  , BMI Body mass index is 18.7 kg/m. GEN: Well nourished, well developed, in no acute distress HEENT: normal Neck: no JVD  Cardiac: RRR  Respiratory:   normal work of breathing GI: soft,  MS: no deformity or atrophy Skin: warm and dry  Neuro:  Strength and sensation are intact Psych: euthymic mood, full affect  EKG:  EKG is ordered today. The ekg ordered today is personally reviewed and shows sinus rhythm 53 bpm, PR 208 msec   Recent  Labs: 05/31/2020: Hemoglobin 14.4; Platelets 231 09/28/2020: BUN 21; Creat 0.82; Potassium 4.4; Sodium 140; TSH 0.82  personally reviewed   Lipid Panel     Component Value Date/Time   CHOL 226 (H) 05/17/2018 0912   TRIG 84.0 05/17/2018 0912   HDL 79.30 05/17/2018 0912   CHOLHDL 3 05/17/2018 0912   VLDL 16.8 05/17/2018 0912   LDLCALC 129 (H) 05/17/2018 0912   LDLDIRECT 131.0 09/03/2009 0813   personally reviewed   Wt Readings from Last 3 Encounters:  12/01/20 112 lb 6.4 oz (51 kg)  11/11/20 112 lb (50.8 kg)  09/29/20 112 lb (50.8 kg)      Other studies personally reviewed: Additional studies/ records that were reviewed today include: Dr Cherlyn Cushing notes, J Cleavers notes, prior echo, my prior notes   Review of the above records today demonstrates: as above   ASSESSMENT AND PLAN:  1.  Paroxysmal atrial fibrillation The patient has symptomatic, recurrent paroxysmal atrial fibrillation. she has failed medical therapy with flecainide. Chads2vasc score is 3.  she is anticoagulated with eliquis . Therapeutic strategies for afib including medicine and ablation were discussed in detail with the patient today. Risk, benefits, and alternatives to EP study and radiofrequency ablation for afib were also discussed in detail today. These risks include but are not limited to stroke, bleeding, vascular damage, tamponade, perforation, damage to the esophagus, lungs, and other structures, pulmonary vein stenosis, worsening renal function, and death. The patient understands these risk and wishes to proceed.  We will therefore proceed with catheter ablation at the next available time.  Carto, ICE, anesthesia are requested for the procedure.  Will also obtain cardiac CT prior to the procedure to exclude LAA thrombus and further evaluate atrial anatomy.  2. Moderate AI Echo reviewed from 05/10/20 Dr Percival Spanish is following  3. HTN Stable No change required today   Risks, benefits and potential  toxicities for medications prescribed and/or refilled reviewed with patient today.     Current medicines are reviewed at length with the patient today.   The patient does not have concerns regarding her medicines.  The following changes were made today:  none  Labs/ tests ordered today include:  No orders of the defined types were placed in this encounter.    Army Fossa, MD  12/01/2020 11:46 AM     [facility] [address] [place] [address] [phone] (office) [phone] (fax)

## 2020-12-01 NOTE — Patient Instructions (Addendum)
Medication Instructions:  Your physician recommends that you continue on your current medications as directed. Please refer to the Current Medication list given to you today.  Labwork: None ordered.  Testing/Procedures: None ordered.  Follow-Up: Your physician wants you to follow-up in: Patient to call with Ablation date. Otila Kluver RN) March 8 or April 5   Any Other Special Instructions Will Be Listed Below (If Applicable).  If you need a refill on your cardiac medications before your next appointment, please call your pharmacy.    Cardiac Ablation Cardiac ablation is a procedure to destroy (ablate) some heart tissue that is sending bad signals. These bad signals cause problems in heart rhythm. The heart has many areas that make these signals. If there are problems in these areas, they can make the heart beat in a way that is not normal. Destroying some tissues can help make the heart rhythm normal. Tell your doctor about:  Any allergies you have.  All medicines you are taking. These include vitamins, herbs, eye drops, creams, and over-the-counter medicines.  Any problems you or family members have had with medicines that make you fall asleep (anesthetics).  Any blood disorders you have.  Any surgeries you have had.  Any medical conditions you have, such as kidney failure.  Whether you are pregnant or may be pregnant. What are the risks? This is a safe procedure. But problems may occur, including:  Infection.  Bruising and bleeding.  Bleeding into the chest.  Stroke or blood clots.  Damage to nearby areas of your body.  Allergies to medicines or dyes.  The need for a pacemaker if the normal system is damaged.  Failure of the procedure to treat the problem. What happens before the procedure? Medicines Ask your doctor about:  Changing or stopping your normal medicines. This is important.  Taking aspirin and ibuprofen. Do not take these medicines unless your doctor  tells you to take them.  Taking other medicines, vitamins, herbs, and supplements. General instructions  Follow instructions from your doctor about what you cannot eat or drink.  Plan to have someone take you home from the hospital or clinic.  If you will be going home right after the procedure, plan to have someone with you for 24 hours.  Ask your doctor what steps will be taken to prevent infection. What happens during the procedure?  An IV tube will be put into one of your veins.  You will be given a medicine to help you relax.  The skin on your neck or groin will be numbed.  A cut (incision) will be made in your neck or groin. A needle will be put through your cut and into a large vein.  A tube (catheter) will be put into the needle. The tube will be moved to your heart.  Dye may be put through the tube. This helps your doctor see your heart.  Small devices (electrodes) on the tube will send out signals.  A type of energy will be used to destroy some heart tissue.  The tube will be taken out.  Pressure will be held on your cut. This helps stop bleeding.  A bandage will be put over your cut. The exact procedure may vary among doctors and hospitals.   What happens after the procedure?  You will be watched until you leave the hospital or clinic. This includes checking your heart rate, breathing rate, oxygen, and blood pressure.  Your cut will be watched for bleeding. You will need to  lie still for a few hours.  Do not drive for 24 hours or as long as your doctor tells you. Summary  Cardiac ablation is a procedure to destroy some heart tissue. This is done to treat heart rhythm problems.  Tell your doctor about any medical conditions you may have. Tell him or her about all medicines you are taking to treat them.  This is a safe procedure. But problems may occur. These include infection, bruising, bleeding, and damage to nearby areas of your body.  Follow what your  doctor tells you about food and drink. You may also be told to change or stop some of your medicines.  After the procedure, do not drive for 24 hours or as long as your doctor tells you. This information is not intended to replace advice given to you by your health care provider. Make sure you discuss any questions you have with your health care provider. Document Revised: 09/18/2019 Document Reviewed: 09/18/2019 Elsevier Patient Education  2021 [employer].

## 2020-12-02 ENCOUNTER — Encounter: Payer: Self-pay | Admitting: *Deleted

## 2020-12-02 DIAGNOSIS — I4891 Unspecified atrial fibrillation: Secondary | ICD-10-CM

## 2020-12-02 NOTE — Addendum Note (Signed)
Addended by: Darrell Jewel on: 12/02/2020 12:44 PM   Modules accepted: Orders

## 2020-12-02 NOTE — Telephone Encounter (Signed)
Left message to call back  

## 2020-12-02 NOTE — Telephone Encounter (Signed)
Answered and discussed all questions.   Ablation scheduled for April 5. CT and labs ordered. Labs and covid screening set up. Patient to view instructions over mychart and call with any questions.   Patient verbalized understanding.

## 2020-12-02 NOTE — Telephone Encounter (Signed)
    Pt is returning Tina's call

## 2020-12-23 ENCOUNTER — Telehealth: Payer: Self-pay | Admitting: Internal Medicine

## 2020-12-23 NOTE — Telephone Encounter (Signed)
  Patient states that she is returning a call from [place], Therapist, sports. She is asking for a callback tomorrow morning after 9:30 am.

## 2020-12-24 NOTE — Telephone Encounter (Signed)
Determined this was an old message. Answered any additional questions the patient had about upcoming tests and procedures.

## 2021-01-10 ENCOUNTER — Other Ambulatory Visit: Payer: Self-pay

## 2021-01-10 ENCOUNTER — Other Ambulatory Visit: Payer: Medicare Other

## 2021-01-10 ENCOUNTER — Other Ambulatory Visit (HOSPITAL_COMMUNITY): Payer: Medicare Other

## 2021-01-10 DIAGNOSIS — I4891 Unspecified atrial fibrillation: Secondary | ICD-10-CM

## 2021-01-10 LAB — CBC WITH DIFFERENTIAL/PLATELET
Basophils Absolute: 0 10*3/uL (ref 0.0–0.2)
Basos: 1 %
EOS (ABSOLUTE): 0.1 10*3/uL (ref 0.0–0.4)
Eos: 2 %
Hematocrit: 40.6 % (ref 34.0–46.6)
Hemoglobin: 13.7 g/dL (ref 11.1–15.9)
Immature Grans (Abs): 0 10*3/uL (ref 0.0–0.1)
Immature Granulocytes: 0 %
Lymphocytes Absolute: 1.3 10*3/uL (ref 0.7–3.1)
Lymphs: 22 %
MCH: 33.2 pg — ABNORMAL HIGH (ref 26.6–33.0)
MCHC: 33.7 g/dL (ref 31.5–35.7)
MCV: 98 fL — ABNORMAL HIGH (ref 79–97)
Monocytes Absolute: 0.8 10*3/uL (ref 0.1–0.9)
Monocytes: 13 %
Neutrophils Absolute: 3.8 10*3/uL (ref 1.4–7.0)
Neutrophils: 62 %
Platelets: 226 10*3/uL (ref 150–450)
RBC: 4.13 x10E6/uL (ref 3.77–5.28)
RDW: 11.5 % — ABNORMAL LOW (ref 11.7–15.4)
WBC: 6 10*3/uL (ref 3.4–10.8)

## 2021-01-10 LAB — BASIC METABOLIC PANEL
BUN/Creatinine Ratio: 26 (ref 12–28)
BUN: 22 mg/dL (ref 8–27)
CO2: 24 mmol/L (ref 20–29)
Calcium: 9.7 mg/dL (ref 8.7–10.3)
Chloride: 103 mmol/L (ref 96–106)
Creatinine, Ser: 0.86 mg/dL (ref 0.57–1.00)
Glucose: 71 mg/dL (ref 65–99)
Potassium: 4 mmol/L (ref 3.5–5.2)
Sodium: 141 mmol/L (ref 134–144)
eGFR: 74 mL/min/{1.73_m2} (ref >59)

## 2021-01-24 ENCOUNTER — Telehealth (HOSPITAL_COMMUNITY): Payer: Self-pay | Admitting: *Deleted

## 2021-01-24 NOTE — Telephone Encounter (Signed)
Reaching out to patient to offer assistance regarding upcoming cardiac imaging study; pt verbalizes understanding of appt date/time, parking situation and where to check in, pre-test NPO status and medications ordered, and verified current allergies; name and call back number provided for further questions should they arise  Anwen Cannedy RN Navigator Cardiac Imaging [facility] [phone] office [phone] cell  

## 2021-01-25 ENCOUNTER — Telehealth: Payer: Self-pay | Admitting: Internal Medicine

## 2021-01-25 ENCOUNTER — Ambulatory Visit (HOSPITAL_COMMUNITY)
Admission: RE | Admit: 2021-01-25 | Discharge: 2021-01-25 | Disposition: A | Discharge status: Home / Self Care | Payer: Medicare Other | Source: Ambulatory Visit | Attending: Internal Medicine | Admitting: Internal Medicine

## 2021-01-25 ENCOUNTER — Other Ambulatory Visit: Payer: Self-pay

## 2021-01-25 ENCOUNTER — Encounter (HOSPITAL_COMMUNITY): Payer: Self-pay

## 2021-01-25 DIAGNOSIS — I4891 Unspecified atrial fibrillation: Secondary | ICD-10-CM | POA: Diagnosis not present

## 2021-01-25 MED ORDER — IOHEXOL 350 MG/ML SOLN
80.0000 mL | Freq: Once | INTRAVENOUS | Status: AC | PRN
  Administered 2021-01-25: 80 mL via INTRAVENOUS

## 2021-01-25 NOTE — Telephone Encounter (Signed)
Diane from [facility]. CT Heart this am: completed report in Epic. Wanted to make sure Dr. Rayann Heman sees impression asap re: ascending aortic aneurysm 4.1 cm.

## 2021-01-25 NOTE — Telephone Encounter (Signed)
Eliz from [facility] is calling to give a call report.

## 2021-01-26 NOTE — Telephone Encounter (Signed)
Unchanged from Echo in 2021. Followed by Dr. Percival Spanish.

## 2021-01-31 ENCOUNTER — Other Ambulatory Visit (HOSPITAL_COMMUNITY)
Admission: RE | Admit: 2021-01-31 | Discharge: 2021-01-31 | Disposition: A | Discharge status: Home / Self Care | Payer: Medicare Other | Source: Ambulatory Visit | Attending: Internal Medicine | Admitting: Internal Medicine

## 2021-01-31 DIAGNOSIS — Z01812 Encounter for preprocedural laboratory examination: Secondary | ICD-10-CM | POA: Insufficient documentation

## 2021-01-31 DIAGNOSIS — Z20822 Contact with and (suspected) exposure to covid-19: Secondary | ICD-10-CM | POA: Insufficient documentation

## 2021-01-31 LAB — SARS CORONAVIRUS 2 (TAT 6-24 HRS): SARS Coronavirus 2: NEGATIVE

## 2021-01-31 NOTE — Anesthesia Preprocedure Evaluation (Addendum)
Anesthesia Evaluation  Patient identified by MRN, date of birth, ID band Patient awake    Reviewed: Allergy & Precautions, NPO status , Patient's Chart, lab work & pertinent test results  Airway Mallampati: II  TM Distance: >3 FB     Dental   Pulmonary neg pulmonary ROS,    breath sounds clear to auscultation       Cardiovascular hypertension,  Rhythm:Regular Rate:Normal     Neuro/Psych    GI/Hepatic negative GI ROS, Neg liver ROS,   Endo/Other  negative endocrine ROS  Renal/GU negative Renal ROS     Musculoskeletal   Abdominal   Peds  Hematology   Anesthesia Other Findings   Reproductive/Obstetrics                            Anesthesia Physical Anesthesia Plan  ASA: III  Anesthesia Plan: General   Post-op Pain Management:    Induction: Intravenous  PONV Risk Score and Plan: 3  Airway Management Planned: Oral ETT  Additional Equipment:   Intra-op Plan:   Post-operative Plan:   Informed Consent: I have reviewed the patients History and Physical, chart, labs and discussed the procedure including the risks, benefits and alternatives for the proposed anesthesia with the patient or authorized representative who has indicated his/her understanding and acceptance.     Dental advisory given  Plan Discussed with: Anesthesiologist and CRNA  Anesthesia Plan Comments:        Anesthesia Quick Evaluation

## 2021-02-01 ENCOUNTER — Encounter (HOSPITAL_COMMUNITY)
Admission: RE | Disposition: A | Discharge status: Home / Self Care | Payer: Medicare Other | Source: Home / Self Care | Attending: Internal Medicine

## 2021-02-01 ENCOUNTER — Ambulatory Visit (HOSPITAL_COMMUNITY)
Admission: RE | Admit: 2021-02-01 | Discharge: 2021-02-01 | Disposition: A | Discharge status: Home / Self Care | Payer: Medicare Other | Attending: Internal Medicine | Admitting: Internal Medicine

## 2021-02-01 ENCOUNTER — Ambulatory Visit (HOSPITAL_COMMUNITY): Payer: Medicare Other | Admitting: Certified Registered Nurse Anesthetist

## 2021-02-01 DIAGNOSIS — Z882 Allergy status to sulfonamides status: Secondary | ICD-10-CM | POA: Diagnosis not present

## 2021-02-01 DIAGNOSIS — Z7901 Long term (current) use of anticoagulants: Secondary | ICD-10-CM | POA: Insufficient documentation

## 2021-02-01 DIAGNOSIS — E559 Vitamin D deficiency, unspecified: Secondary | ICD-10-CM | POA: Diagnosis not present

## 2021-02-01 DIAGNOSIS — I480 Paroxysmal atrial fibrillation: Secondary | ICD-10-CM | POA: Diagnosis not present

## 2021-02-01 DIAGNOSIS — Z79899 Other long term (current) drug therapy: Secondary | ICD-10-CM | POA: Insufficient documentation

## 2021-02-01 DIAGNOSIS — J309 Allergic rhinitis, unspecified: Secondary | ICD-10-CM | POA: Diagnosis not present

## 2021-02-01 DIAGNOSIS — Z885 Allergy status to narcotic agent status: Secondary | ICD-10-CM | POA: Insufficient documentation

## 2021-02-01 DIAGNOSIS — I484 Atypical atrial flutter: Secondary | ICD-10-CM | POA: Diagnosis not present

## 2021-02-01 DIAGNOSIS — I10 Essential (primary) hypertension: Secondary | ICD-10-CM | POA: Diagnosis not present

## 2021-02-01 HISTORY — PX: ATRIAL FIBRILLATION ABLATION: EP1191

## 2021-02-01 SURGERY — ATRIAL FIBRILLATION ABLATION
Anesthesia: General

## 2021-02-01 MED ORDER — DEXAMETHASONE SODIUM PHOSPHATE 10 MG/ML IJ SOLN
INTRAMUSCULAR | Status: DC | PRN
  Administered 2021-02-01: 5 mg via INTRAVENOUS

## 2021-02-01 MED ORDER — ONDANSETRON HCL 4 MG/2ML IJ SOLN
4.0000 mg | Freq: Four times a day (QID) | INTRAMUSCULAR | Status: DC | PRN
Start: 2021-02-01 — End: 2021-02-01

## 2021-02-01 MED ORDER — SODIUM CHLORIDE 0.9% FLUSH: 3.0000 mL | INTRAVENOUS | Status: DC | PRN

## 2021-02-01 MED ORDER — HEPARIN (PORCINE) IN NACL 1000-0.9 UT/500ML-% IV SOLN
INTRAVENOUS | Status: DC | PRN
  Administered 2021-02-01: 500 mL

## 2021-02-01 MED ORDER — ONDANSETRON HCL 4 MG/2ML IJ SOLN
INTRAMUSCULAR | Status: DC | PRN
  Administered 2021-02-01: 4 mg via INTRAVENOUS

## 2021-02-01 MED ORDER — SODIUM CHLORIDE 0.9 % IV SOLN: 250.0000 mL | INTRAVENOUS | Status: DC | PRN

## 2021-02-01 MED ORDER — FENTANYL CITRATE (PF) 250 MCG/5ML IJ SOLN
INTRAMUSCULAR | Status: DC | PRN
  Administered 2021-02-01: 75 ug via INTRAVENOUS

## 2021-02-01 MED ORDER — PHENYLEPHRINE HCL-NACL 10-0.9 MG/250ML-% IV SOLN
INTRAVENOUS | Status: DC | PRN
  Administered 2021-02-01: 25 ug/min via INTRAVENOUS

## 2021-02-01 MED ORDER — HEPARIN SODIUM (PORCINE) 1000 UNIT/ML IJ SOLN
INTRAMUSCULAR | Status: DC | PRN
  Administered 2021-02-01: 15000 [IU] via INTRAVENOUS
  Administered 2021-02-01: 1000 [IU] via INTRAVENOUS

## 2021-02-01 MED ORDER — PANTOPRAZOLE SODIUM 40 MG PO TBEC: 40.0000 mg | DELAYED_RELEASE_TABLET | Freq: Every day | ORAL | 0 refills | Status: DC

## 2021-02-01 MED ORDER — HEPARIN SODIUM (PORCINE) 1000 UNIT/ML IJ SOLN
INTRAMUSCULAR | Status: AC
  Filled 2021-02-01: qty 1

## 2021-02-01 MED ORDER — PROPOFOL 10 MG/ML IV BOLUS
INTRAVENOUS | Status: DC | PRN
  Administered 2021-02-01: 140 mg via INTRAVENOUS

## 2021-02-01 MED ORDER — MIDAZOLAM HCL 2 MG/2ML IJ SOLN
INTRAMUSCULAR | Status: DC | PRN
  Administered 2021-02-01 (×2): 1 mg via INTRAVENOUS

## 2021-02-01 MED ORDER — HEPARIN (PORCINE) IN NACL 1000-0.9 UT/500ML-% IV SOLN
INTRAVENOUS | Status: AC
  Filled 2021-02-01: qty 500

## 2021-02-01 MED ORDER — SODIUM CHLORIDE 0.9% FLUSH: 3.0000 mL | Freq: Two times a day (BID) | INTRAVENOUS | Status: DC

## 2021-02-01 MED ORDER — LIDOCAINE 2% (20 MG/ML) 5 ML SYRINGE
INTRAMUSCULAR | Status: DC | PRN
  Administered 2021-02-01: 70 mg via INTRAVENOUS

## 2021-02-01 MED ORDER — SODIUM CHLORIDE 0.9 % IV SOLN: INTRAVENOUS | Status: DC

## 2021-02-01 MED ORDER — ROCURONIUM BROMIDE 10 MG/ML (PF) SYRINGE
PREFILLED_SYRINGE | INTRAVENOUS | Status: DC | PRN
  Administered 2021-02-01: 50 mg via INTRAVENOUS

## 2021-02-01 MED ORDER — ACETAMINOPHEN 325 MG PO TABS
650.0000 mg | ORAL_TABLET | ORAL | Status: DC | PRN
  Filled 2021-02-01: qty 2

## 2021-02-01 MED ORDER — PROTAMINE SULFATE 10 MG/ML IV SOLN
INTRAVENOUS | Status: DC | PRN
  Administered 2021-02-01: 30 mg via INTRAVENOUS

## 2021-02-01 MED ORDER — SUGAMMADEX SODIUM 200 MG/2ML IV SOLN
INTRAVENOUS | Status: DC | PRN
  Administered 2021-02-01: 200 mg via INTRAVENOUS

## 2021-02-01 MED ORDER — APIXABAN 5 MG PO TABS
5.0000 mg | ORAL_TABLET | ORAL | Status: AC
  Administered 2021-02-01: 5 mg via ORAL
  Filled 2021-02-01: qty 1

## 2021-02-01 SURGICAL SUPPLY — 18 items
BLANKET WARM UNDERBOD FULL ACC (MISCELLANEOUS) ×2 IMPLANT
CATH 8FR REPROCESSED SOUNDSTAR (CATHETERS) ×2 IMPLANT
CATH MAPPNG PENTARAY F 2-6-2MM (CATHETERS) ×1 IMPLANT
CATH SMTCH THERMOCOOL SF DF (CATHETERS) ×2 IMPLANT
CATH WEB BI DIR CSDF CRV REPRO (CATHETERS) ×2 IMPLANT
CLOSURE PERCLOSE PROSTYLE (VASCULAR PRODUCTS) ×6 IMPLANT
COVER SWIFTLINK CONNECTOR (BAG) ×2 IMPLANT
NEEDLE BAYLIS TRANSSEPTAL 71CM (NEEDLE) ×2 IMPLANT
PACK EP LATEX FREE (CUSTOM PROCEDURE TRAY) ×2
PACK EP LF (CUSTOM PROCEDURE TRAY) ×1 IMPLANT
PAD PRO RADIOLUCENT 2001M-C (PAD) ×2 IMPLANT
PATCH CARTO3 (PAD) ×2 IMPLANT
PENTARAY F 2-6-2MM (CATHETERS) ×2
SHEATH PINNACLE 7F 10CM (SHEATH) ×4 IMPLANT
SHEATH PINNACLE 8F 10CM (SHEATH) ×2 IMPLANT
SHEATH PROBE COVER 6X72 (BAG) ×2 IMPLANT
SHEATH SWARTZ TS SL2 63CM 8.5F (SHEATH) ×2 IMPLANT
TUBING SMART ABLATE COOLFLOW (TUBING) ×2 IMPLANT

## 2021-02-01 NOTE — Anesthesia Postprocedure Evaluation (Signed)
Anesthesia Post Note  Patient: [name] [name]  Procedure(s) Performed: ATRIAL FIBRILLATION ABLATION (N/A )     Patient location during evaluation: PACU Anesthesia Type: General Level of consciousness: awake Pain management: pain level controlled Vital Signs Assessment: post-procedure vital signs reviewed and stable Respiratory status: spontaneous breathing Cardiovascular status: stable Postop Assessment: no apparent nausea or vomiting Anesthetic complications: no   No complications documented.  Last Vitals:  Vitals:   02/01/21 1025 02/01/21 1030  BP: 100/61 100/65  Pulse: (!) 102 (!) 101  Resp: 16 (!) 27  Temp: 36.4 C   SpO2: 98% 95%    Last Pain:  Vitals:   02/01/21 1025  TempSrc: Temporal  PainSc: 0-No pain                 Shantanu Strauch

## 2021-02-01 NOTE — Progress Notes (Signed)
Up and walked and tolerated well; right groin stable, no bleeding or hematoma 

## 2021-02-01 NOTE — Transfer of Care (Signed)
Immediate Anesthesia Transfer of Care Note  Patient: [name] [name]  Procedure(s) Performed: ATRIAL FIBRILLATION ABLATION (N/A )  Patient Location: Cath Lab  Anesthesia Type:General  Level of Consciousness: awake and alert   Airway & Oxygen Therapy: Patient Spontanous Breathing and Patient connected to nasal cannula oxygen  Post-op Assessment: Report given to RN and Post -op Vital signs reviewed and stable  Post vital signs: Reviewed and stable  Last Vitals:  Vitals Value Taken Time  BP 102/60 02/01/21 1021  Temp    Pulse 99 02/01/21 1022  Resp 21 02/01/21 1022  SpO2 96 % 02/01/21 1022  Vitals shown include unvalidated device data.  Last Pain:  Vitals:   02/01/21 0607  TempSrc:   PainSc: 0-No pain      Patients Stated Pain Goal: 1 (94/37/00 5259)  Complications: No complications documented.

## 2021-02-01 NOTE — Progress Notes (Signed)
Dr Rayann Heman in

## 2021-02-01 NOTE — Discharge Instructions (Signed)
Femoral Site Care  This sheet gives you information about how to care for yourself after your procedure. Your health care provider may also give you more specific instructions. If you have problems or questions, contact your health care provider. What can I expect after the procedure? After the procedure, it is common to have:  Bruising that usually fades within 1-2 weeks.  Tenderness at the site. Follow these instructions at home: Wound care  Follow instructions from your health care provider about how to take care of your insertion site. Make sure you: ? Wash your hands with soap and water before you change your bandage (dressing). If soap and water are not available, use hand sanitizer. ? Change your dressing as told by your health care provider. ? Leave stitches (sutures), skin glue, or adhesive strips in place. These skin closures may need to stay in place for 2 weeks or longer. If adhesive strip edges start to loosen and curl up, you may trim the loose edges. Do not remove adhesive strips completely unless your health care provider tells you to do that.  Do not take baths, swim, or use a hot tub until your health care provider approves.  You may shower 24-48 hours after the procedure or as told by your health care provider. ? Gently wash the site with plain soap and water. ? Pat the area dry with a clean towel. ? Do not rub the site. This may cause bleeding.  Do not apply powder or lotion to the site. Keep the site clean and dry.  Check your femoral site every day for signs of infection. Check for: ? Redness, swelling, or pain. ? Fluid or blood. ? Warmth. ? Pus or a bad smell. Activity  For the first 2-3 days after your procedure, or as long as directed: ? Avoid climbing stairs as much as possible. ? Do not squat.  Do not lift anything that is heavier than 10 lb (4.5 kg), or the limit that you are told, until your health care provider says that it is safe.  Rest as  directed. ? Avoid sitting for a long time without moving. Get up to take short walks every 1-2 hours.  Do not drive for 24 hours if you were given a medicine to help you relax (sedative). General instructions  Take over-the-counter and prescription medicines only as told by your health care provider.  Keep all follow-up visits as told by your health care provider. This is important. Contact a health care provider if you have:  A fever or chills.  You have redness, swelling, or pain around your insertion site. Get help right away if:  The catheter insertion area swells very fast.  You pass out.  You suddenly start to sweat or your skin gets clammy.  The catheter insertion area is bleeding, and the bleeding does not stop when you hold steady pressure on the area.  The area near or just beyond the catheter insertion site becomes pale, cool, tingly, or numb. These symptoms may represent a serious problem that is an emergency. Do not wait to see if the symptoms will go away. Get medical help right away. Call your local emergency services (911 in the [place] Do not drive yourself to the hospital. Summary  After the procedure, it is common to have bruising that usually fades within 1-2 weeks.  Check your femoral site every day for signs of infection.  Do not lift anything that is heavier than 10 lb (4.5 kg), or   the limit that you are told, until your health care provider says that it is safe. This information is not intended to replace advice given to you by your health care provider. Make sure you discuss any questions you have with your health care provider. Document Revised: 06/18/2020 Document Reviewed: 06/18/2020 Elsevier Patient Education  2021 Elsevier Inc.  

## 2021-02-01 NOTE — H&P (Signed)
CC: afib   History of Present Illness: [name] [name] is a 70 y.o. female who presents today for electrophysiology study and ablation of afib.  The patient was diagnosed with afib in 2016.  She has done well with flecainide. She is on eliquis for stroke prevention.  Her afib has increased in frequency and duration.  She has palpitations and fatigue with her afib.  She finds it to be anxiety provoking.   Today, she denies symptoms of palpitations, chest pain, shortness of breath, orthopnea, PND, lower extremity edema, claudication, dizziness, presyncope, syncope, bleeding, or neurologic sequela. The patient is tolerating medications without difficulties and is otherwise without complaint today.        Past Medical History:  Diagnosis Date  . Allergic rhinitis   . Aortic insufficiency   . CAT SCRATCH 09/10/2009   Qualifier: Diagnosis of  By: Regis Bill MD, [name]   . Clavicle fracture 3/08   left  . Diverticulosis   . HTN (hypertension)   . Osteopenia   . Paroxysmal atrial fibrillation (HCC)   . Vision disturbance 03/13/2013   Negative neuro MRA MRI        Past Surgical History:  Procedure Laterality Date  . ABDOMINAL HYSTERECTOMY     pt denies 04/15/14  . APPENDECTOMY    . LAPAROSCOPIC SALPINGO OOPHERECTOMY Right   . TONSILLECTOMY AND ADENOIDECTOMY             Current Outpatient Medications  Medication Sig Dispense Refill  . acetaminophen (TYLENOL) 500 MG tablet Take 500 mg by mouth as needed.    [name] alendronate (FOSAMAX) 70 MG tablet TAKE 1 TABLET ONCE A WEEK  WITH A FULL GLASS OF WATER  ON AN EMPTY STOMACH 12 tablet 3  . Calcium Carbonate-Vitamin D (CALCIUM PLUS VITAMIN D PO) Take 1 tablet by mouth daily.    [name] diltiazem (TIAZAC) 300 MG 24 hr capsule Take 300 mg by mouth daily.    [name] ELIQUIS 5 MG TABS tablet TAKE 1 TABLET BY MOUTH  TWICE DAILY 180 tablet 3  . flecainide (TAMBOCOR) 100 MG tablet Take 1 tablet (100 mg total) by mouth 2 (two) times  daily. 180 tablet 3  . LORazepam (ATIVAN) 0.5 MG tablet Take 1 tablet by mouth as needed.    . Loteprednol Etabonate (EYSUVIS) 0.25 % SUSP Place 1 drop into the left eye 3 (three) times daily. Until gone    . metoprolol tartrate (LOPRESSOR) 25 MG tablet Take 1 tablet (25 mg total) by mouth 2 (two) times daily. 180 tablet 3  . Multiple Vitamins-Minerals (PRESERVISION AREDS PO) Take 1 tablet by mouth 2 (two) times daily.     No current facility-administered medications for this visit.    Allergies:   Codeine phosphate, Demerol [meperidine], Meperidine hcl, Meperidine hcl, and Sulfa antibiotics   Social History:  The patient  reports that she has never smoked. She has never used smokeless tobacco. She reports that she does not drink alcohol and does not use drugs.   Family History:  The patient's family history includes Coronary artery disease (age of onset: 69) in her father; Esophageal cancer in her paternal grandmother; Hyperlipidemia in her mother; Scleroderma in her mother.    ROS:  Please see the history of present illness.   All other systems are personally reviewed and negative.    PHYSICAL EXAM: Vitals:   02/01/21 0537  BP: (!) 142/69  Pulse: 65  Temp: 98 F (36.7 C)  SpO2: 98%    GEN:  Well nourished, well developed, in no acute distress HEENT: normal Neck: no JVD  Cardiac: RRR  Respiratory:   normal work of breathing GI: soft,  MS: no deformity or atrophy Skin: warm and dry  Neuro:  Strength and sensation are intact Psych: euthymic mood, full affect  EKG:  EKG is ordered today. The ekg ordered today is personally reviewed and shows sinus rhythm 53 bpm, PR 208 msec    ASSESSMENT AND PLAN:  1.  Paroxysmal atrial fibrillation The patient has symptomatic, recurrent paroxysmal atrial fibrillation. she has failed medical therapy with flecainide. Chads2vasc score is 3.  she is anticoagulated with eliquis .   Risk, benefits, and alternatives to  EP study and radiofrequency ablation for afib were also discussed in detail today. These risks include but are not limited to stroke, bleeding, vascular damage, tamponade, perforation, damage to the esophagus, lungs, and other structures, pulmonary vein stenosis, worsening renal function, and death. The patient understands these risk and wishes to proceed.   Cardiac CT reviewed with her today.  She reports compliance with eliquis without interruption.  Thompson Grayer MD, [place] 02/01/2021 7:24 AM

## 2021-02-01 NOTE — Anesthesia Procedure Notes (Signed)
Procedure Name: Intubation Date/Time: 02/01/2021 7:44 AM Performed by: Valda Favia, CRNA Pre-anesthesia Checklist: Patient identified, Emergency Drugs available, Suction available and Patient being monitored Patient Re-evaluated:Patient Re-evaluated prior to induction Oxygen Delivery Method: Circle System Utilized Preoxygenation: Pre-oxygenation with 100% oxygen Induction Type: IV induction Ventilation: Mask ventilation without difficulty Laryngoscope Size: Mac and 4 Grade View: Grade II Tube type: Oral Tube size: 7.0 mm Number of attempts: 1 Airway Equipment and Method: Stylet and Oral airway Placement Confirmation: ETT inserted through vocal cords under direct vision,  positive ETCO2 and breath sounds checked- equal and bilateral Secured at: 21 cm Tube secured with: Tape Dental Injury: Teeth and Oropharynx as per pre-operative assessment

## 2021-02-02 ENCOUNTER — Encounter (HOSPITAL_COMMUNITY): Payer: Self-pay | Admitting: Internal Medicine

## 2021-02-02 LAB — POCT ACTIVATED CLOTTING TIME
Activated Clotting Time: 368 seconds
Activated Clotting Time: 404 seconds

## 2021-02-03 ENCOUNTER — Telehealth (HOSPITAL_COMMUNITY): Payer: Self-pay | Admitting: *Deleted

## 2021-02-03 NOTE — Telephone Encounter (Signed)
Called and answered all question for patient. She verbalized understanding.

## 2021-02-03 NOTE — Telephone Encounter (Signed)
Patient converted into afib overnight last night HR was up over 100-115 didn't sleep very well. She took her morning medications and her HR has normalized to 67 BP  123/77. Educated pt on post-ablation breakthrough afib - can take extra 1/2 tab of metoprolol for elevated HRs over 100 if not time for her normal dosing of metoprolol. Pt appreciative of reassurance and directions on what to use PRN.

## 2021-02-06 ENCOUNTER — Telehealth: Payer: Self-pay | Admitting: Cardiology

## 2021-02-06 NOTE — Telephone Encounter (Signed)
Pt now with rash all over not just at electrode sites.  She did try hydrocortisone cream.  So will stop protonix only new oral meds and then hydrocortisone cream.  She will stop both and use pepcid 20 mg BID and for pruritis benadryl po and can use benadryl cream as well.if facial swelling to go to ER.  She will call back if rash not improving.

## 2021-02-07 NOTE — Addendum Note (Signed)
Addended by: Saddie Benders E on: 02/07/2021 10:01 AM   Modules accepted: Orders

## 2021-02-08 NOTE — Telephone Encounter (Signed)
Please send in the pictures to decide next.

## 2021-02-10 ENCOUNTER — Telehealth: Payer: Self-pay | Admitting: Pharmacist

## 2021-02-10 NOTE — Progress Notes (Signed)
Chronic Care Management Pharmacy Note  02/11/2021 Name:  [name] [name] MRN:  [id number] DOB:  11/29/1950  Subjective: [name] [name] is an 70 y.o. year old female who is a primary patient of [name], [name], MD.  The CCM team was consulted for assistance with disease management and care coordination needs.    Engaged with patient by telephone for follow up visit in response to provider referral for pharmacy case management and/or care coordination services.   Consent to Services:  The patient was given information about Chronic Care Management services, agreed to services, and gave verbal consent prior to initiation of services.  Please see initial visit note for detailed documentation.   Patient Care Team: [name], [name], MD as PCP - General Minus Breeding, MD as PCP - Cardiology (Cardiology) [name] Jacks, MD (Ophthalmology) [name] Breslow, MD (Orthopedic Surgery) Minus Breeding, MD as Consulting Physician (Cardiology) [name] [name], [facility] as Pharmacist (Pharmacist)  Recent office visits: 09/29/20 [name] Ace, MD: Patient presented for a rash. Prescribed triamcinolone and Valtrex.  Recent consult visits: 12/01/20 [name] Grayer, MD (cardiology): Patient presented for A fib follow up. No medication changes made.  09/28/20 [name] Kingdom, MD (endo): Patient presented for osteoporosis follow up. No changes made.  08/02/20 [name] [name], [place] (cardiology): Patient presented for Afib follow up. Follow up in 3 months.  Hospital visits: 02/01/21 Patient was admitted for ablation procedure.  Objective:  Lab Results  Component Value Date   CREATININE 0.86 01/10/2021   BUN 22 01/10/2021   GFR 84.89 05/17/2018   GFRNONAA 74 09/28/2020   GFRAA 86 09/28/2020   NA 141 01/10/2021   K 4.0 01/10/2021   CALCIUM 9.7 01/10/2021   CO2 24 01/10/2021   GLUCOSE 71 01/10/2021    Lab Results  Component Value Date/Time   GFR 84.89 05/17/2018 09:12 AM   GFR 91.07 09/14/2016 08:05 AM    Last  diabetic Eye exam:  Lab Results  Component Value Date/Time   HMDIABEYEEXA Normal done by Dr.Groat 12/07/2011 12:00 AM    Last diabetic Foot exam: No results found for: HMDIABFOOTEX   Lab Results  Component Value Date   CHOL 226 (H) 05/17/2018   HDL 79.30 05/17/2018   LDLCALC 129 (H) 05/17/2018   LDLDIRECT 131.0 09/03/2009   TRIG 84.0 05/17/2018   CHOLHDL 3 05/17/2018    Hepatic Function Latest Ref Rng & Units 05/17/2018 09/14/2016 10/15/2014  Total Protein 6.0 - 8.3 g/dL 7.4 7.3 7.1  Albumin 3.5 - 5.2 g/dL 4.5 4.5 4.1  AST 0 - 37 U/L '21 20 23  ' ALT 0 - 35 U/L '23 15 16  ' Alk Phosphatase 39 - 117 U/L 37(L) 42 40  Total Bilirubin 0.2 - 1.2 mg/dL 0.7 0.8 0.6  Bilirubin, Direct 0.0 - 0.3 mg/dL 0.1 0.1 0.0    Lab Results  Component Value Date/Time   TSH 0.82 09/28/2020 08:22 AM   TSH 0.867 10/06/2019 02:47 PM   FREET4 1.00 12/08/2011 08:05 AM    CBC Latest Ref Rng & Units 01/10/2021 05/31/2020 10/06/2019  WBC 3.4 - 10.8 x10E3/uL 6.0 6.0 6.7  Hemoglobin 11.1 - 15.9 g/dL 13.7 14.4 14.0  Hematocrit 34.0 - 46.6 % 40.6 41.9 41.8  Platelets 150 - 450 x10E3/uL 226 231 233    Lab Results  Component Value Date/Time   VD25OH 44.3 09/28/2020 08:22 AM   VD25OH 57.64 11/20/2019 08:06 AM   VD25OH 43.25 09/23/2018 11:33 AM    Clinical ASCVD: No  The 10-year ASCVD risk score (  [name] [name]., et al., 2013) is: 6.6%   Values used to calculate the score:     Age: 8 years     Sex: Female     Is Non-Hispanic African American: No     Diabetic: No     Tobacco smoker: No     Systolic Blood Pressure: 132 mmHg     Is BP treated: Yes     HDL Cholesterol: 79.3 mg/dL     Total Cholesterol: 226 mg/dL    Depression screen [facility] 2/9 09/29/2020 05/17/2018  Decreased Interest 0 0  Down, Depressed, Hopeless 0 0  PHQ - 2 Score 0 0  Some recent data might be hidden     CHA2DS2/VAS Stroke Risk Points  Current as of 4 hours ago     3 >= 2 Points: High Risk  1 - 1.99 Points: Medium Risk  0 Points: Low  Risk    Last Change: N/A      Details    This score determines the patient's risk of having a stroke if the  patient has atrial fibrillation.       Points Metrics  0 Has Congestive Heart Failure:  No    Current as of 4 hours ago  0 Has Vascular Disease:  No    Current as of 4 hours ago  1 Has Hypertension:  Yes    Current as of 4 hours ago  1 Age:  70    Current as of 4 hours ago  0 Has Diabetes:  No    Current as of 4 hours ago  0 Had Stroke:  No  Had TIA:  No  Had Thromboembolism:  No    Current as of 4 hours ago  1 Female:  Yes    Current as of 4 hours ago     Social History   Tobacco Use  Smoking Status Never Smoker  Smokeless Tobacco Never Used   BP Readings from Last 3 Encounters:  02/01/21 105/64  01/25/21 (!) 141/68  12/01/20 (!) 150/68   Pulse Readings from Last 3 Encounters:  02/01/21 (!) 110  01/25/21 (!) 59  12/01/20 (!) 53   Wt Readings from Last 3 Encounters:  02/01/21 110 lb (49.9 kg)  12/01/20 112 lb 6.4 oz (51 kg)  11/11/20 112 lb (50.8 kg)   BMI Readings from Last 3 Encounters:  02/01/21 18.59 kg/m  12/01/20 18.70 kg/m  11/11/20 18.64 kg/m    Assessment/Interventions: Review of patient past medical history, allergies, medications, health status, including review of consultants reports, laboratory and other test data, was performed as part of comprehensive evaluation and provision of chronic care management services.   SDOH:  (Social Determinants of Health) assessments and interventions performed: No  SDOH Screenings   Alcohol Screen: Not on file  Depression (PHQ2-9): Low Risk   . PHQ-2 Score: 0  Financial Resource Strain: Low Risk   . Difficulty of Paying Living Expenses: Not hard at all  Food Insecurity: Not on file  Housing: Not on file  Physical Activity: Not on file  Social Connections: Not on file  Stress: Not on file  Tobacco Use: Low Risk   . Smoking Tobacco Use: Never Smoker  . Smokeless Tobacco Use: Never Used   Transportation Needs: No Transportation Needs  . Lack of Transportation (Medical): No  . Lack of Transportation (Non-Medical): No    CCM Care Plan  Allergies  Allergen Reactions  . Codeine Phosphate     drowsy  .  Demerol [Meperidine] Nausea And Vomiting  . Protonix [Pantoprazole] Rash  . Sulfa Antibiotics Rash    Medications Reviewed Today    Reviewed by [name] [name], [facility] (Pharmacist) on 02/11/21 at 1437  Med List Status: <None>  Medication Order Taking? Sig Documenting Provider Last Dose Status Informant  acetaminophen (TYLENOL) 500 MG tablet [id number]  Take 1,000 mg by mouth every 6 (six) hours as needed for moderate pain. [provider]  Active Self  alendronate (FOSAMAX) 70 MG tablet [id number] Yes TAKE 1 TABLET ONCE A WEEK  WITH A FULL GLASS OF WATER  ON AN EMPTY STOMACH  Patient taking differently: Take 70 mg by mouth every Saturday. WITH A FULL GLASS OF WATER ON AN EMPTY STOMACH   [name] Kingdom, MD Taking Active   Calcium Carb-Cholecalciferol [phone] MG-UNIT CAPS [id number] Yes Take 1 capsule by mouth daily. [provider] Taking Active Self  diltiazem (TIAZAC) 300 MG 24 hr capsule [id number] Yes Take 300 mg by mouth daily. [provider] Taking Active Self  ELIQUIS 5 MG TABS tablet [id number] Yes TAKE 1 TABLET BY MOUTH  TWICE DAILY  Patient taking differently: Take 5 mg by mouth 2 (two) times daily.   Minus Breeding, MD Taking Active   famotidine (PEPCID) 20 MG tablet [id number] Yes Take 20 mg by mouth 2 (two) times daily. [provider] Taking Active   flecainide (TAMBOCOR) 100 MG tablet [id number] Yes Take 1 tablet (100 mg total) by mouth 2 (two) times daily. Minus Breeding, MD Taking Active Self  LORazepam (ATIVAN) 0.5 MG tablet [id number] Yes Take 0.5 mg by mouth daily as needed (when flying). [provider] Taking Active Self  metoprolol tartrate (LOPRESSOR) 25 MG tablet [id number] Yes Take 1 tablet (25 mg total) by  mouth 2 (two) times daily.  Patient taking differently: Take 12.5-25 mg by mouth See admin instructions. Take 25 mg twice daily, may take an additional 12.5 mg dose twice daily as needed afib   Minus Breeding, MD Taking Active   Multiple Vitamins-Minerals (PRESERVISION AREDS PO) [id number] Yes Take 1 tablet by mouth 2 (two) times daily. [provider] Taking Active Self          Patient Active Problem List   Diagnosis Date Noted  . Posterior capsular opacification non visually significant of left eye 07/19/2020  . Left epiretinal membrane 07/19/2020  . Right epiretinal membrane 07/19/2020  . Low back pain 07/14/2020  . Pain in left leg 07/14/2020  . Educated about COVID-19 virus infection 05/30/2020  . Chronic pain of right knee 03/31/2019  . Essential hypertension 06/30/2017  . Age related osteoporosis 10/19/2016  . Paroxysmal atrial fibrillation ([place]) 09/27/2015  . Hemorrhage of rectum and anus 04/16/2014  . Special screening for malignant neoplasms, colon 04/16/2014  . Hemorrhoids, unspecified hemorrhoid type 04/16/2014  . Plantar wart of left foot 05/14/2013  . Foot callus 05/14/2013  . Bilateral bunions 03/13/2013  . Hypertrophic toenail 03/13/2013  . Vision disturbance 03/13/2013  . Post-nasal drainage 05/05/2012  . Aortic insufficiency 01/16/2012  . Macular degeneration 10/12/2011  . ANXIETY, SITUATIONAL 05/08/2008  . Vitamin D insufficiency 05/22/2007  . ELEVATED BLOOD PRESSURE WITHOUT DIAGNOSIS OF HYPERTENSION 05/22/2007  . Allergic rhinitis due to allergen 03/28/2007    Immunization History  Administered Date(s) Administered  . Fluad Quad(high Dose 65+) 09/02/2019  . Influenza Split 08/25/2011, 09/03/2013, 08/03/2014  . Influenza Whole 08/25/2009, 07/30/2010, 07/30/2012  . Influenza, High Dose Seasonal PF 07/30/2020  . Influenza,inj,Quad PF,6+ Mos 08/17/2015, 07/18/2016, 07/30/2017  .  Influenza-Unspecified 08/20/2018, 08/09/2019  . PFIZER(Purple  Top)SARS-COV-2 Vaccination 12/05/2019, 12/30/2019, 08/17/2020  . Pneumococcal Conjugate-13 03/05/2018  . Pneumococcal Polysaccharide-23 02/02/2020  . Pneumococcal-Unspecified 12/02/2019  . Td 05/22/2007  . Zoster 10/02/2013  . Zoster Recombinat (Shingrix) 03/22/2018, 07/28/2018   Patient reported her biggest concerns right now are with Afib and she has had several bouts of it since last Tuesday after her ablation. She also had a rash after the ablation and was taken off pantoprazole as a precaution and Dr. Regis Bill recommended trying Zyrtec for her reaction. She did not try it as the rash has been going away on it's own but is aware to try it if it plateaus. She has been using cetaphil for the rash. Cardiology also switched her to famotidine 20 mg BID until May 20th for esophagus protection post ablation.   Patient has not been sleeping well lately and we discussed trying melatonin timed release 2 or 3 mg per night 1 hour before bedtime to see if this helps.   Conditions to be addressed/monitored:  Hypertension, Atrial Fibrillation, Anxiety, Osteoporosis and Macular degeneration  Care Plan : [place]  Updates made by [name] [name], [place] since 02/11/2021 12:00 AM    Problem: Problem: Hypertension, Atrial Fibrillation, Anxiety, Osteoporosis and Macular degeneration     Long-Range Goal: Patient-Specific Goal   Start Date: 02/11/2021  Expected End Date: 02/11/2022  This Visit's Progress: On track  Priority: High  Note:   Current Barriers:  . Unable to maintain control of Afib  Pharmacist Clinical Goal(s):  [name] Patient will achieve adherence to monitoring guidelines and medication adherence to achieve therapeutic efficacy through collaboration with PharmD and provider.   Interventions: . 1:1 collaboration with [name], [name], MD regarding development and update of comprehensive plan of care as evidenced by provider attestation and co-signature . Inter-disciplinary care team  collaboration (see longitudinal plan of care) . Comprehensive medication review performed; medication list updated in electronic medical record  Hypertension (BP goal <140/90) -Controlled -Current treatment:  Diltiazem 300 mg 1 capsule daily  Metoprolol tartrate 25 mg 1 tablet twice daily -Medications previously tried: n/a  -Current home readings: 80/67 (in Afib), up to 140/60-70s; today: 122/75 -Current dietary habits: limits salt intake -Current exercise habits: [employer] in [place] classes and walking -Denies hypotensive/hypertensive symptoms -Educated on Exercise goal of 150 minutes per week; Importance of home blood pressure monitoring; -Counseled to monitor BP at home at least weekly, document, and provide log at future appointments -Counseled on diet and exercise extensively Recommended to continue current medication  Atrial Fibrillation (Goal: prevent stroke and major bleeding) -Not ideally controlled -CHADSVASC: 3 -Current treatment: . Rate/rhythm control: Flecainide 100 mg 1 tablet twice daily; Metoprolol twice daily . Anticoagulation: Eliquis 5 mg 1 tablet twice daily -Medications previously tried: none -Home BP and HR readings: 90-102 (in Afib), 55-64 (resting)  -Counseled on increased risk of stroke due to Afib and benefits of anticoagulation for stroke prevention; -Recommended to continue current medication  Anxiety for flying (Goal: minimize symptoms) -Controlled -Current treatment: . Lorazepam 0.5 mg 1 tablet as needed -Medications previously tried/failed: none -GAD7: n/a -Educated on Benefits of medication for symptom control -Recommended to continue current medication  Osteoporosis (Goal prevent fractures) -Controlled -Last DEXA Scan: 08/2020              T-Score femoral neck: LFN -2.3, RFN -2.5             T-Score total hip: n/a  T-Score lumbar spine: -1.9             T-Score forearm radius: n/a             10-year probability of major  osteoporotic fracture: n/a             10-year probability of hip fracture: n/a -Patient is a candidate for pharmacologic treatment due to T-Score < -2.5 in femoral neck -Current treatment   Fosamax 70 mg 1 tablet weekly - Saturday  Calcium-vitamin D 600 mg- 2500 units daily -Medications previously tried: none  -Recommend [phone] units of vitamin D daily. Recommend 1200 mg of calcium daily from dietary and supplemental sources. Counseled on oral bisphosphonate administration: take in the morning, 30 minutes prior to food with 6-8 oz of water. Do not lie down for at least 30 minutes after taking. Recommend weight-bearing and muscle strengthening exercises for building and maintaining bone density. -Recommended to continue current medication  Macular degeneration (Goal: maintain healthy eyes) -Controlled -Current treatment   Preservision areds 1 tablet twice daily  Eysuvis 0.25% apply 1 drop three times daily -Medications previously tried: none  -Recommended to continue current medication  Health Maintenance -Vaccine gaps: tetanus (patient reported getting at pharmacy a couple years ago - unable to confirm with [facility]) -Current therapy:  . Tylenol 500 mg 1 tablet as needed -Educated on Cost vs benefit of each product must be carefully weighed by individual consumer -Patient is satisfied with current therapy and denies issues -Recommended to continue current medication  Patient Goals/Self-Care Activities . Patient will:  - check blood pressure at least weekly, document, and provide at future appointments target a minimum of 150 minutes of moderate intensity exercise weekly  Follow Up Plan: Telephone follow up appointment with care management team member scheduled for: 6 months       Medication Assistance: None required.  Patient affirms current coverage meets needs.  Patient's preferred pharmacy is:  [id number] [place], [address]Phone: [phone]: [phone] [address] [address]Phone: [phone]: [phone] [address] [address]Phone: [phone]: [phone] Uses pill box? Yes -  weekly  - recommended AM/PM Pt endorses 100% compliance  We discussed: Current pharmacy is preferred with insurance plan and patient is satisfied with pharmacy services Patient decided to: Continue current medication management strategy  Care Plan and Follow Up Patient Decision:  Patient agrees to Care Plan and Follow-up.  Plan: Telephone follow up appointment with care management team member scheduled for:  6 months  MJeni Salles PharmD BRockwallPharmacist [place]aw[place] BRunning [phone]

## 2021-02-10 NOTE — Telephone Encounter (Signed)
You could try a different antihistamine oral   Such as zyrtec otc  (Can cause some sedation but not likje benadryl)  Topical hydrocortisone is o k to try but may not be strong enough .   I avoid topical benadryl cause some people break out from using it.  . ? Does it itch? Or more from irritation of skin  ? It certainly seems related to the procedure .and if so should eventually fade .

## 2021-02-10 NOTE — Chronic Care Management (AMB) (Signed)
I spoke with the patient about her upcoming appointment on 02/11/2021 @ 8:30 am with the clinical pharmacist. She was asked to please have all medication on hand to review with the pharmacist. She confirmed the appointment.   Maia Breslow, [place] [phone]

## 2021-02-11 ENCOUNTER — Ambulatory Visit (INDEPENDENT_AMBULATORY_CARE_PROVIDER_SITE_OTHER): Payer: Medicare Other | Admitting: Pharmacist

## 2021-02-11 DIAGNOSIS — I10 Essential (primary) hypertension: Secondary | ICD-10-CM

## 2021-02-11 DIAGNOSIS — I480 Paroxysmal atrial fibrillation: Secondary | ICD-10-CM

## 2021-02-11 NOTE — Patient Instructions (Addendum)
Hi [name],  It was great to get to speak with you again! Below is a summary of some of the topics we discussed.   Just as a reminder, consider taking melatonin 2 or 3 mg per night 1 hour before bedtime to see if this helps with sleep. You may want to use the timed release formulation for help with staying asleep. I also attached some information that may be helpful for the trouble you are having with sleep and maintaining good sleep hygiene.  Please reach out to me if you have any questions or need anything before our follow up!  Best, Maddie  Jeni [name], PharmD, [name]  Visit Information  Goals Addressed   None    Patient Care Plan: [facility] Pharmacy Care Plan    Problem Identified: Problem: Hypertension, Atrial Fibrillation, Anxiety, Osteoporosis and Macular degeneration     Long-Range Goal: Patient-Specific Goal   Start Date: 02/11/2021  Expected End Date: 02/11/2022  This Visit's Progress: On track  Priority: High  Note:   Current Barriers:  . Unable to maintain control of Afib  Pharmacist Clinical Goal(s):  [name] Patient will achieve adherence to monitoring guidelines and medication adherence to achieve therapeutic efficacy through collaboration with PharmD and provider.   Interventions: . 1:1 collaboration with [name], [name], MD regarding development and update of comprehensive plan of care as evidenced by provider attestation and co-signature . Inter-disciplinary care team collaboration (see longitudinal plan of care) . Comprehensive medication review performed; medication list updated in electronic medical record  Hypertension (BP goal <140/90) -Controlled -Current treatment:  Diltiazem 300 mg 1 capsule daily  Metoprolol tartrate 25 mg 1 tablet twice daily -Medications previously tried: n/a  -Current home readings: 80/67 (in Afib), up to 140/60-70s; today: 122/75 -Current dietary habits: limits salt  intake -Current exercise habits: [employer] in [place] classes and walking -Denies hypotensive/hypertensive symptoms -Educated on Exercise goal of 150 minutes per week; Importance of home blood pressure monitoring; -Counseled to monitor BP at home at least weekly, document, and provide log at future appointments -Counseled on diet and exercise extensively Recommended to continue current medication  Atrial Fibrillation (Goal: prevent stroke and major bleeding) -Not ideally controlled -CHADSVASC: 3 -Current treatment: . Rate/rhythm control: Flecainide 100 mg 1 tablet twice daily; Metoprolol twice daily . Anticoagulation: Eliquis 5 mg 1 tablet twice daily -Medications previously tried: none -Home BP and HR readings: 90-102 (in Afib), 55-64 (resting)  -Counseled on increased risk of stroke due to Afib and benefits of anticoagulation for stroke prevention; -Recommended to continue current medication  Anxiety for flying (Goal: minimize symptoms) -Controlled -Current treatment: . Lorazepam 0.5 mg 1 tablet as needed -Medications previously tried/failed: none -GAD7: n/a -Educated on Benefits of medication for symptom control -Recommended to continue current medication  Osteoporosis (Goal prevent fractures) -Controlled -Last DEXA Scan: 08/2020              T-Score femoral neck: LFN -2.3, RFN -2.5             T-Score total hip: n/a             T-Score lumbar spine: -1.9             T-Score forearm radius: n/a             10-year probability of major osteoporotic fracture: n/a             10-year probability of hip fracture: n/a -Patient is a candidate for  pharmacologic treatment due to T-Score < -2.5 in femoral neck -Current treatment   Fosamax 70 mg 1 tablet weekly - Saturday  Calcium-vitamin D 600 mg- 2500 units daily -Medications previously tried: none  -Recommend [phone] units of vitamin D daily. Recommend 1200 mg of calcium daily from dietary and supplemental sources. Counseled on  oral bisphosphonate administration: take in the morning, 30 minutes prior to food with 6-8 oz of water. Do not lie down for at least 30 minutes after taking. Recommend weight-bearing and muscle strengthening exercises for building and maintaining bone density. -Recommended to continue current medication  Macular degeneration (Goal: maintain healthy eyes) -Controlled -Current treatment   Preservision areds 1 tablet twice daily  Eysuvis 0.25% apply 1 drop three times daily -Medications previously tried: none  -Recommended to continue current medication  Health Maintenance -Vaccine gaps: tetanus (patient reported getting at pharmacy a couple years ago - unable to confirm with [facility]) -Current therapy:  . Tylenol 500 mg 1 tablet as needed -Educated on Cost vs benefit of each product must be carefully weighed by individual consumer -Patient is satisfied with current therapy and denies issues -Recommended to continue current medication  Patient Goals/Self-Care Activities . Patient will:  - check blood pressure at least weekly, document, and provide at future appointments target a minimum of 150 minutes of moderate intensity exercise weekly  Follow Up Plan: Telephone follow up appointment with care management team member scheduled for: 6 months       Patient verbalizes understanding of instructions provided today and agrees to view in [employer].  Telephone follow up appointment with pharmacy team member scheduled for:6 months  [name] [name], [facility]  Insomnia Insomnia is a sleep disorder that makes it difficult to fall asleep or stay asleep. Insomnia can cause fatigue, low energy, difficulty concentrating, mood swings, and poor performance at work or school. There are three different ways to classify insomnia:  Difficulty falling asleep.  Difficulty staying asleep.  Waking up too early in the morning. Any type of insomnia can be long-term (chronic) or short-term (acute). Both are  common. Short-term insomnia usually lasts for three months or less. Chronic insomnia occurs at least three times a week for longer than three months. What are the causes? Insomnia may be caused by another condition, situation, or substance, such as:  Anxiety.  Certain medicines.  Gastroesophageal reflux disease (GERD) or other gastrointestinal conditions.  Asthma or other breathing conditions.  Restless legs syndrome, sleep apnea, or other sleep disorders.  Chronic pain.  Menopause.  Stroke.  Abuse of alcohol, tobacco, or illegal drugs.  Mental health conditions, such as depression.  Caffeine.  Neurological disorders, such as Alzheimer's disease.  An overactive thyroid (hyperthyroidism). Sometimes, the cause of insomnia may not be known. What increases the risk? Risk factors for insomnia include:  Gender. Women are affected more often than men.  Age. Insomnia is more common as you get older.  Stress.  Lack of exercise.  Irregular work schedule or working night shifts.  Traveling between different time zones.  Certain medical and mental health conditions. What are the signs or symptoms? If you have insomnia, the main symptom is having trouble falling asleep or having trouble staying asleep. This may lead to other symptoms, such as:  Feeling fatigued or having low energy.  Feeling nervous about going to sleep.  Not feeling rested in the morning.  Having trouble concentrating.  Feeling irritable, anxious, or depressed. How is this diagnosed? This condition may be diagnosed based on:  Your symptoms and medical history. Your health care provider may ask about: ? Your sleep habits. ? Any medical conditions you have. ? Your mental health.  A physical exam. How is this treated? Treatment for insomnia depends on the cause. Treatment may focus on treating an underlying condition that is causing insomnia. Treatment may also include:  Medicines to help you  sleep.  Counseling or therapy.  Lifestyle adjustments to help you sleep better. Follow these instructions at home: Eating and drinking  Limit or avoid alcohol, caffeinated beverages, and cigarettes, especially close to bedtime. These can disrupt your sleep.  Do not eat a large meal or eat spicy foods right before bedtime. This can lead to digestive discomfort that can make it hard for you to sleep.   Sleep habits  Keep a sleep diary to help you and your health care provider figure out what could be causing your insomnia. Write down: ? When you sleep. ? When you wake up during the night. ? How well you sleep. ? How rested you feel the next day. ? Any side effects of medicines you are taking. ? What you eat and drink.  Make your bedroom a dark, comfortable place where it is easy to fall asleep. ? Put up shades or blackout curtains to block light from outside. ? Use a white noise machine to block noise. ? Keep the temperature cool.  Limit screen use before bedtime. This includes: ? Watching TV. ? Using your smartphone, tablet, or computer.  Stick to a routine that includes going to bed and waking up at the same times every day and night. This can help you fall asleep faster. Consider making a quiet activity, such as reading, part of your nighttime routine.  Try to avoid taking naps during the day so that you sleep better at night.  Get out of bed if you are still awake after 15 minutes of trying to sleep. Keep the lights down, but try reading or doing a quiet activity. When you feel sleepy, go back to bed.   General instructions  Take over-the-counter and prescription medicines only as told by your health care provider.  Exercise regularly, as told by your health care provider. Avoid exercise starting several hours before bedtime.  Use relaxation techniques to manage stress. Ask your health care provider to suggest some techniques that may work well for you. These may  include: ? Breathing exercises. ? Routines to release muscle tension. ? Visualizing peaceful scenes.  Make sure that you drive carefully. Avoid driving if you feel very sleepy.  Keep all follow-up visits as told by your health care provider. This is important. Contact a health care provider if:  You are tired throughout the day.  You have trouble in your daily routine due to sleepiness.  You continue to have sleep problems, or your sleep problems get worse. Get help right away if:  You have serious thoughts about hurting yourself or someone else. If you ever feel like you may hurt yourself or others, or have thoughts about taking your own life, get help right away. You can go to your nearest emergency department or call:  Your local emergency services (911 in the [place]  A suicide crisis helpline, such as the [facility] at (703) 631-6486. This is open 24 hours a day. Summary  Insomnia is a sleep disorder that makes it difficult to fall asleep or stay asleep.  Insomnia can be long-term (chronic) or short-term (acute).  Treatment for insomnia  depends on the cause. Treatment may focus on treating an underlying condition that is causing insomnia.  Keep a sleep diary to help you and your health care provider figure out what could be causing your insomnia. This information is not intended to replace advice given to you by your health care provider. Make sure you discuss any questions you have with your health care provider. Document Revised: 08/26/2020 Document Reviewed: 08/26/2020 Elsevier Patient Education  2021 [employer].

## 2021-02-17 ENCOUNTER — Telehealth (HOSPITAL_COMMUNITY): Payer: Self-pay | Admitting: *Deleted

## 2021-02-17 NOTE — Telephone Encounter (Signed)
Pt called in stating she is having episodes of breakthrough afib. She has had 5 episodes of afib since her ablation ranging from 1-12 hours. Reassured pt breakthrough afib is expected post ablation. Use PRN metoprolol if in afib over 45 min-1 hour. Pt to call if frequency increases.

## 2021-02-25 DIAGNOSIS — Z23 Encounter for immunization: Secondary | ICD-10-CM | POA: Diagnosis not present

## 2021-03-02 ENCOUNTER — Encounter (HOSPITAL_COMMUNITY): Payer: Self-pay | Admitting: Nurse Practitioner

## 2021-03-02 ENCOUNTER — Other Ambulatory Visit: Payer: Self-pay

## 2021-03-02 ENCOUNTER — Ambulatory Visit (HOSPITAL_COMMUNITY)
Admission: RE | Admit: 2021-03-02 | Discharge: 2021-03-02 | Disposition: A | Discharge status: Home / Self Care | Payer: Medicare Other | Source: Ambulatory Visit | Attending: Nurse Practitioner | Admitting: Nurse Practitioner

## 2021-03-02 VITALS — BP 134/70 | HR 61 | Ht 64.5 in | Wt 111.2 lb

## 2021-03-02 DIAGNOSIS — I10 Essential (primary) hypertension: Secondary | ICD-10-CM | POA: Diagnosis not present

## 2021-03-02 DIAGNOSIS — Z7901 Long term (current) use of anticoagulants: Secondary | ICD-10-CM | POA: Diagnosis not present

## 2021-03-02 DIAGNOSIS — D6869 Other thrombophilia: Secondary | ICD-10-CM

## 2021-03-02 DIAGNOSIS — Z79899 Other long term (current) drug therapy: Secondary | ICD-10-CM | POA: Insufficient documentation

## 2021-03-02 DIAGNOSIS — I480 Paroxysmal atrial fibrillation: Secondary | ICD-10-CM | POA: Insufficient documentation

## 2021-03-02 NOTE — Progress Notes (Signed)
Primary Care Physician: [name] Medin, MD Referring Physician: Dr. Percival Spanish EP: Dr. [name] is a 70 y.o. female with a h/o HTN, paroxysmal afib, initially diagnosed with afib for several years. She is on rate control/flecainde  meds as well as eliquis 5 mg for a chadsvasc score  of 3. She is in the afib clinic to f/u ablation one month ago. She did have some episodes the second week of ablation but sine 18th of April she has not had anymore episodes. She did have an allergic reaction on her torso, probaly form the pads that were placed on chest for cardioversion. It has resolved. No swallowing or groin issues. She is back to her usual activities.    Today, she denies symptoms of palpitations, chest pain, shortness of breath, orthopnea, PND, lower extremity edema, dizziness, presyncope, syncope, or neurologic sequela. The patient is tolerating medications without difficulties and is otherwise without complaint today.   Past Medical History:  Diagnosis Date  . Allergic rhinitis   . Aortic insufficiency   . CAT SCRATCH 09/10/2009   Qualifier: Diagnosis of  By: Regis Bill MD, [name]   . Clavicle fracture 3/08   left  . Diverticulosis   . HTN (hypertension)   . Osteopenia   . Paroxysmal atrial fibrillation (HCC)   . Vision disturbance 03/13/2013   Negative neuro MRA MRI   Past Surgical History:  Procedure Laterality Date  . ABDOMINAL HYSTERECTOMY     pt denies 04/15/14  . APPENDECTOMY    . ATRIAL FIBRILLATION ABLATION N/A 02/01/2021   Procedure: ATRIAL FIBRILLATION ABLATION;  Surgeon: Thompson Grayer, MD;  Location: [facility];  Service: Cardiovascular;  Laterality: N/A;  . LAPAROSCOPIC SALPINGO OOPHERECTOMY Right   . TONSILLECTOMY AND ADENOIDECTOMY      Current Outpatient Medications  Medication Sig Dispense Refill  . acetaminophen (TYLENOL) 500 MG tablet Take 1,000 mg by mouth every 6 (six) hours as needed for moderate pain.    [name] alendronate (FOSAMAX) 70 MG  tablet TAKE 1 TABLET ONCE A WEEK  WITH A FULL GLASS OF WATER  ON AN EMPTY STOMACH 12 tablet 3  . Calcium Carb-Cholecalciferol [phone] MG-UNIT CAPS Take 1 capsule by mouth daily.    [name] diltiazem (TIAZAC) 300 MG 24 hr capsule Take 300 mg by mouth daily.    [name] ELIQUIS 5 MG TABS tablet TAKE 1 TABLET BY MOUTH  TWICE DAILY 180 tablet 3  . famotidine (PEPCID) 20 MG tablet Take 20 mg by mouth 2 (two) times daily.    . flecainide (TAMBOCOR) 100 MG tablet Take 1 tablet (100 mg total) by mouth 2 (two) times daily. 180 tablet 3  . LORazepam (ATIVAN) 0.5 MG tablet Take 0.5 mg by mouth daily as needed (when flying).    . metoprolol tartrate (LOPRESSOR) 25 MG tablet Take 1 tablet (25 mg total) by mouth 2 (two) times daily. 180 tablet 3  . Multiple Vitamins-Minerals (PRESERVISION AREDS PO) Take 1 tablet by mouth 2 (two) times daily.     No current facility-administered medications for this encounter.    Allergies  Allergen Reactions  . Latex Hives  . Codeine Phosphate     drowsy  . Demerol [Meperidine] Nausea And Vomiting  . Protonix [Pantoprazole] Rash  . Sulfa Antibiotics Rash    Social History   Socioeconomic History  . Marital status: Married    Spouse name: Not on file  . Number of children: 0  . Years of education:  Not on file  . Highest education level: Not on file  Occupational History  . Occupation: Veterinary surgeon  . Occupation: REPORTER    Employer: [employer]  Tobacco Use  . Smoking status: Never Smoker  . Smokeless tobacco: Never Used  Substance and Sexual Activity  . Alcohol use: No    Comment: never  . Drug use: No  . Sexual activity: Not on file  Other Topics Concern  . Not on file  Social History Narrative   Married   hh of 2    p0g0   Works for [employer]    Social Determinants of Radio broadcast assistant Strain: [place]   . Difficulty of Paying Living Expenses: Not hard at all  Food Insecurity: Not on file  Transportation Needs: No  Transportation Needs  . Lack of Transportation (Medical): No  . Lack of Transportation (Non-Medical): No  Physical Activity: Not on file  Stress: Not on file  Social Connections: Not on file  Intimate Partner Violence: Not on file    Family History  Problem Relation Age of Onset  . Scleroderma Mother   . Hyperlipidemia Mother   . Coronary artery disease Father 41  . Esophageal cancer Paternal Grandmother     ROS- All systems are reviewed and negative except as per the HPI above  Physical Exam: Vitals:   03/02/21 0908  BP: 134/70  Pulse: 61  Weight: 50.4 kg  Height: 5' 4.5" (1.638 m)   Wt Readings from Last 3 Encounters:  03/02/21 50.4 kg  02/01/21 49.9 kg  12/01/20 51 kg    Labs: Lab Results  Component Value Date   NA 141 01/10/2021   K 4.0 01/10/2021   CL 103 01/10/2021   CO2 24 01/10/2021   GLUCOSE 71 01/10/2021   BUN 22 01/10/2021   CREATININE 0.86 01/10/2021   CALCIUM 9.7 01/10/2021   No results found for: INR Lab Results  Component Value Date   CHOL 226 (H) 05/17/2018   HDL 79.30 05/17/2018   LDLCALC 129 (H) 05/17/2018   TRIG 84.0 05/17/2018     GEN- The patient is well appearing, alert and oriented x 3 today.   Head- normocephalic, atraumatic Eyes-  Sclera clear, conjunctiva pink Ears- hearing intact Oropharynx- clear Neck- supple, no JVP Lymph- no cervical lymphadenopathy Lungs- Clear to ausculation bilaterally, normal work of breathing Heart- Regular rate and rhythm, no murmurs, rubs or gallops, PMI not laterally displaced GI- soft, NT, ND, + BS Extremities- no clubbing, cyanosis, or edema MS- no significant deformity or atrophy Skin- no rash or lesion Psych- euthymic mood, full affect Neuro- strength and sensation are intact  EKG- NSR at 61 bpm, PR int 206 ms, qrs int 112 ms, qtc 453 ms   Assessment and Plan: 1. Paroxysmal afib S/p ablation 4/5 Some afib past procedure but none since Continue flecainide 100 mg bid  Continue  metoprolol 25 mg bid   2. CHA2DS2VASc score of at least 3 Continue  eliquis 5 mg bid    F/u with Dr. Rayann Heman 7/11   [name] [name], [facility] [address] [place], [place] [address] [phone]

## 2021-03-15 ENCOUNTER — Other Ambulatory Visit: Payer: Self-pay | Admitting: Internal Medicine

## 2021-03-23 DIAGNOSIS — Z681 Body mass index (BMI) 19 or less, adult: Secondary | ICD-10-CM | POA: Diagnosis not present

## 2021-03-31 DIAGNOSIS — Z1211 Encounter for screening for malignant neoplasm of colon: Secondary | ICD-10-CM

## 2021-03-31 DIAGNOSIS — K625 Hemorrhage of anus and rectum: Secondary | ICD-10-CM

## 2021-04-07 ENCOUNTER — Telehealth (HOSPITAL_COMMUNITY): Payer: Self-pay | Admitting: *Deleted

## 2021-04-07 ENCOUNTER — Ambulatory Visit: Payer: Medicare Other | Admitting: Cardiology

## 2021-04-07 MED ORDER — DILTIAZEM HCL 30 MG PO TABS: ORAL_TABLET | ORAL | 1 refills | Status: DC

## 2021-04-07 NOTE — Telephone Encounter (Signed)
Patient called in stating since June 1st she is having increased afib duration and frequency HR in the upper 90s. Makes her feel very anxious. Reassured in the 3 month post ablation period afib is expected. She has been using extra metoprolol PRN but it does not seem to make much difference to her rhythm it just goes away when it wants to the patient states.  Discussed with Adline Peals PA will try using PRN cardizem 30mg  tablets for breakthrough and see if more responsive to her rhythm. If burden continues to increase or no effect from PRN cardizem she will let us know.

## 2021-04-10 NOTE — Telephone Encounter (Signed)
I have been out of office .So  delaying  message answering  Gi should assign you a  new GI provider .   Since  you are having symptoms  of concern we can do a Gi referral to  anew Gi in [place] and have them decide on next  colon  timing .  If she agrees please do a Gi referral to [place]  with info that she saw Dr Deatra Ina in the past

## 2021-04-18 ENCOUNTER — Other Ambulatory Visit: Payer: Self-pay

## 2021-04-18 ENCOUNTER — Encounter: Payer: Self-pay | Admitting: Physician Assistant

## 2021-04-18 MED ORDER — DILTIAZEM HCL ER BEADS 300 MG PO CP24: 300.0000 mg | ORAL_CAPSULE | Freq: Every day | ORAL | 3 refills | Status: DC

## 2021-04-21 NOTE — Progress Notes (Addendum)
Cardiology Office Note   Date:  04/22/2021   ID:  [name] [name], DOB 1951-10-21, MRN [id number]  PCP:  Burnis Medin, MD  Cardiologist:   Minus Breeding, MD   Chief Complaint  Patient presents with   Atrial Fibrillation       History of Present Illness: [name] [name] is a 70 y.o. female who for follow up of palpitations and aortic insufficiency.  She has paroxysmal atrial fibrillation. She is now status post ablation.    She has had some paroxysms.  She has started having more paroxysms lasting all day long.  They are quite distressing to her though they are not associated with presyncope or syncope.  It does seem like sometimes her blood pressure is lower when she is in atrial fibrillation.  She has been using as needed Cardizem on top of her other medications at the direction of the Atrial Fib clinic.  She is done everything she can try to avoid any triggers and she is really found none.  She is not exercising as much because she does not feel comfortable doing this with all the A. fib she is having.  She is anxious about this.  She is not having any new shortness of breath, PND or orthopnea.  She is not having any presyncope or syncope.   Past Medical History:  Diagnosis Date   Allergic rhinitis    Aortic insufficiency    CAT SCRATCH 09/10/2009   Qualifier: Diagnosis of  By: Regis Bill MD, [name]    Clavicle fracture 3/08   left   Diverticulosis    HTN (hypertension)    Osteopenia    Paroxysmal atrial fibrillation (HCC)    Vision disturbance 03/13/2013   Negative neuro MRA MRI    Past Surgical History:  Procedure Laterality Date   ABDOMINAL HYSTERECTOMY     pt denies 04/15/14   APPENDECTOMY     ATRIAL FIBRILLATION ABLATION N/A 02/01/2021   Procedure: ATRIAL FIBRILLATION ABLATION;  Surgeon: Thompson Grayer, MD;  Location: [facility];  Service: Cardiovascular;  Laterality: N/A;   LAPAROSCOPIC SALPINGO OOPHERECTOMY Right    TONSILLECTOMY AND ADENOIDECTOMY        Current Outpatient Medications  Medication Sig Dispense Refill   acetaminophen (TYLENOL) 500 MG tablet Take 1,000 mg by mouth every 6 (six) hours as needed for moderate pain.     alendronate (FOSAMAX) 70 MG tablet TAKE 1 TABLET ONCE A WEEK  WITH A FULL GLASS OF WATER  ON AN EMPTY STOMACH 12 tablet 3   Calcium Carb-Cholecalciferol [phone] MG-UNIT CAPS Take 1 capsule by mouth daily.     diltiazem (CARDIZEM) 30 MG tablet Take 1 tablet every 4 hours AS NEEDED for breakthrough AFIB 45 tablet 1   diltiazem (TIAZAC) 300 MG 24 hr capsule Take 1 capsule (300 mg total) by mouth daily. 90 capsule 3   ELIQUIS 5 MG TABS tablet TAKE 1 TABLET BY MOUTH  TWICE DAILY 180 tablet 3   flecainide (TAMBOCOR) 100 MG tablet Take 1 tablet (100 mg total) by mouth 2 (two) times daily. 180 tablet 3   LORazepam (ATIVAN) 0.5 MG tablet Take 0.5 mg by mouth daily as needed (when flying).     metoprolol tartrate (LOPRESSOR) 25 MG tablet Take 1 tablet (25 mg total) by mouth 2 (two) times daily. 180 tablet 3   Multiple Vitamins-Minerals (PRESERVISION AREDS PO) Take 1 tablet by mouth 2 (two) times daily.     No current facility-administered medications  for this visit.    Allergies:   Latex, Codeine phosphate, Demerol [meperidine], Protonix [pantoprazole], and Sulfa antibiotics    ROS:  Please see the history of present illness.   Otherwise, review of systems are positive for none.   All other systems are reviewed and negative.    PHYSICAL EXAM: VS:  BP (!) 152/68   Pulse 60   Ht 5' 4.5" (1.638 m)   Wt 108 lb 9.6 oz (49.3 kg)   SpO2 97%   BMI 18.35 kg/m  , BMI Body mass index is 18.35 kg/m. GENERAL:  Well appearing NECK:  No jugular venous distention, waveform within normal limits, carotid upstroke brisk and symmetric, no bruits, no thyromegaly LUNGS:  Clear to auscultation bilaterally CHEST:  Unremarkable HEART:  PMI not displaced or sustained,S1 and S2 within normal limits, no S3, no S4, no clicks, no rubs,  3 out of 6 diastolic murmur heard at the third left intercostal space and around the axilla and persisting late into diastole, no systolic murmurs ABD:  Flat, positive bowel sounds normal in frequency in pitch, no bruits, no rebound, no guarding, no midline pulsatile mass, no hepatomegaly, no splenomegaly EXT:  2 plus pulses throughout, no edema, no cyanosis no clubbing  EKG:  EKG is not  ordered today. **   Recent Labs: 09/28/2020: TSH 0.82 01/10/2021: BUN 22; Creatinine, Ser 0.86; Hemoglobin 13.7; Platelets 226; Potassium 4.0; Sodium 141    Lipid Panel    Component Value Date/Time   CHOL 226 (H) 05/17/2018 0912   TRIG 84.0 05/17/2018 0912   HDL 79.30 05/17/2018 0912   CHOLHDL 3 05/17/2018 0912   VLDL 16.8 05/17/2018 0912   LDLCALC 129 (H) 05/17/2018 0912   LDLDIRECT 131.0 09/03/2009 0813      Wt Readings from Last 3 Encounters:  04/22/21 108 lb 9.6 oz (49.3 kg)  03/02/21 111 lb 3.2 oz (50.4 kg)  02/01/21 110 lb (49.9 kg)      Other studies Reviewed: Additional studies/ records that were reviewed today include: None. Review of the above records demonstrates:  Please see elsewhere in the note.     ASSESSMENT AND PLAN:    Aortic insufficiency -  This was still moderate on echo in July of last year.  This is being followed with an echo again next month and this is scheduled.  Atrial fib-  [name] has a CHA2DS2 - VASc score of 2.  She has a follow-up with Dr. Rayann Heman on the first week of July and may need changes in her antiarrhythmics.  She would not tolerate any further up titration of her AV nodal blocking agents.   HTN - The blood pressure was running low and I reviewed these records.  Systolics are often in the 90s but she does not really have symptoms related to this so she is tolerating the current medicines.  No change in therapy.   Enlarged aorta - I think we can follow this with the echocardiogram this year and potentially with a CT and maybe coronary  angiogram in the future.  Elevated coronary calcium - She does have an elevated coronary calcium score noted on the CT.  This was 231 which was 86 percentile.  She had a negative stress test in 2018 and currently has no symptoms.  We will treat this with primary risk reduction and I will follow-up possibly with stress testing or CT angiogram in the future.  Elevated lipid - Her LDL was 129 with an HDL  of 79.3.  She does not want to consider statin at this point and we discussed a plant-based diet.  This could be repeated in about 6 months.    Current medicines are reviewed at length with the patient today.  The patient does not have concerns regarding medicines.  The following changes have been made:  None  Labs/ tests ordered today include: None  No orders of the defined types were placed in this encounter.    Disposition:   FU with me in 6 months.    Signed, Minus Breeding, MD  04/22/2021 9:31 AM    [facility]

## 2021-04-22 ENCOUNTER — Other Ambulatory Visit: Payer: Self-pay

## 2021-04-22 ENCOUNTER — Ambulatory Visit: Payer: Medicare Other | Admitting: Cardiology

## 2021-04-22 ENCOUNTER — Encounter: Payer: Self-pay | Admitting: Cardiology

## 2021-04-22 VITALS — BP 152/68 | HR 60 | Ht 64.5 in | Wt 108.6 lb

## 2021-04-22 DIAGNOSIS — I10 Essential (primary) hypertension: Secondary | ICD-10-CM

## 2021-04-22 DIAGNOSIS — I480 Paroxysmal atrial fibrillation: Secondary | ICD-10-CM | POA: Diagnosis not present

## 2021-04-22 DIAGNOSIS — I351 Nonrheumatic aortic (valve) insufficiency: Secondary | ICD-10-CM | POA: Diagnosis not present

## 2021-04-22 NOTE — Patient Instructions (Signed)
Medication Instructions:  Your physician recommends that you continue on your current medications as directed. Please refer to the Current Medication list given to you today.  *If you need a refill on your cardiac medications before your next appointment, please call your pharmacy*   Lab Work: None ordered.    Testing/Procedures: None ordered.    Follow-Up: At [facility], you and your health needs are our priority.  As part of our continuing mission to provide you with exceptional heart care, we have created designated Provider Care Teams.  These Care Teams include your primary Cardiologist (physician) and Advanced Practice Providers (APPs -  Physician Assistants and Nurse Practitioners) who all work together to provide you with the care you need, when you need it.  We recommend signing up for the patient portal called "MyChart".  Sign up information is provided on this After Visit Summary.  MyChart is used to connect with patients for Virtual Visits (Telemedicine).  Patients are able to view lab/test results, encounter notes, upcoming appointments, etc.  Non-urgent messages can be sent to your provider as well.   To learn more about what you can do with MyChart, go to [email or web address].    Your next appointment:   6 month(s)  The format for your next appointment:   In Person  Provider:   Minus Breeding, MD

## 2021-04-25 ENCOUNTER — Ambulatory Visit: Payer: Medicare Other | Admitting: Cardiology

## 2021-04-28 ENCOUNTER — Ambulatory Visit (HOSPITAL_COMMUNITY): Payer: Medicare Other | Attending: Internal Medicine

## 2021-04-28 ENCOUNTER — Encounter (HOSPITAL_COMMUNITY): Payer: Self-pay | Admitting: Radiology

## 2021-04-28 ENCOUNTER — Other Ambulatory Visit: Payer: Self-pay

## 2021-04-28 DIAGNOSIS — I351 Nonrheumatic aortic (valve) insufficiency: Secondary | ICD-10-CM | POA: Diagnosis not present

## 2021-04-28 DIAGNOSIS — I712 Thoracic aortic aneurysm, without rupture, unspecified: Secondary | ICD-10-CM

## 2021-04-28 LAB — ECHOCARDIOGRAM COMPLETE
P 1/2 time: 440 msec
S' Lateral: 3.2 cm

## 2021-04-28 NOTE — Progress Notes (Signed)
Discussed with patient. Pt has appt with Dr. Rayann Heman for 3 month post ablation follow up next week. Since pt is rate controlled and overall tolerating symptoms related to AF will not make any changes at this time. Pt will call me back if her symptoms should worsen before this appt next week. Pt frustrated to have more afib post ablation than pre-ablation. Reassured pt.

## 2021-04-28 NOTE — Progress Notes (Signed)
Patient ID: [name] [name], female   DOB: 1951/10/24, 70 y.o.   MRN: [id number] Patient arrived for her echocardiogram with concerns regarding her atrial fibrillation. The patient called and spoke with Beatris Si regarding her atrial fibrillation. The patient was told to increase her medication and take up to every 4 hours. She was told to call back if she had any problems. Currently the patient is in controlled atrial fibrillation with HR of 70 bpm. She would like some guidance due to her staying in atrial fibrillation. She not having shortness of breath or chest pain at this time. If someone could give her call, she would greatly appreciate it.

## 2021-05-04 ENCOUNTER — Encounter (HOSPITAL_BASED_OUTPATIENT_CLINIC_OR_DEPARTMENT_OTHER): Payer: Self-pay | Admitting: Internal Medicine

## 2021-05-04 ENCOUNTER — Ambulatory Visit (HOSPITAL_BASED_OUTPATIENT_CLINIC_OR_DEPARTMENT_OTHER): Payer: Medicare Other | Admitting: Internal Medicine

## 2021-05-04 ENCOUNTER — Other Ambulatory Visit: Payer: Self-pay

## 2021-05-04 ENCOUNTER — Encounter (HOSPITAL_BASED_OUTPATIENT_CLINIC_OR_DEPARTMENT_OTHER): Payer: Self-pay

## 2021-05-04 VITALS — BP 132/68 | HR 59 | Ht 64.5 in | Wt 108.2 lb

## 2021-05-04 DIAGNOSIS — I351 Nonrheumatic aortic (valve) insufficiency: Secondary | ICD-10-CM

## 2021-05-04 DIAGNOSIS — I480 Paroxysmal atrial fibrillation: Secondary | ICD-10-CM

## 2021-05-04 DIAGNOSIS — I10 Essential (primary) hypertension: Secondary | ICD-10-CM | POA: Diagnosis not present

## 2021-05-04 DIAGNOSIS — I712 Thoracic aortic aneurysm, without rupture, unspecified: Secondary | ICD-10-CM

## 2021-05-04 NOTE — Progress Notes (Signed)
PCP: Burnis Medin, MD Primary Cardiologist: Dr Kari Baars is a 70 y.o. female who presents today for routine electrophysiology followup.  Since his recent afib ablation, the patient reports doing reasonably well.  She has had ERAF.   she denies procedure related complications.  She does not tolerate afib well.  Today, she denies symptoms of palpitations, chest pain, shortness of breath,  lower extremity edema, dizziness, presyncope, or syncope.  The patient is otherwise without complaint today.   Past Medical History:  Diagnosis Date   Allergic rhinitis    Aortic insufficiency    CAT SCRATCH 09/10/2009   Qualifier: Diagnosis of  By: Regis Bill MD, [name]    Clavicle fracture 3/08   left   Diverticulosis    HTN (hypertension)    Osteopenia    Paroxysmal atrial fibrillation (HCC)    Vision disturbance 03/13/2013   Negative neuro MRA MRI   Past Surgical History:  Procedure Laterality Date   ABDOMINAL HYSTERECTOMY     pt denies 04/15/14   APPENDECTOMY     ATRIAL FIBRILLATION ABLATION N/A 02/01/2021   Procedure: ATRIAL FIBRILLATION ABLATION;  Surgeon: Thompson Grayer, MD;  Location: [facility];  Service: Cardiovascular;  Laterality: N/A;   LAPAROSCOPIC SALPINGO OOPHERECTOMY Right    TONSILLECTOMY AND ADENOIDECTOMY      ROS- all systems are personally reviewed and negatives except as per HPI above  Current Outpatient Medications  Medication Sig Dispense Refill   acetaminophen (TYLENOL) 500 MG tablet Take 1,000 mg by mouth every 6 (six) hours as needed for moderate pain.     alendronate (FOSAMAX) 70 MG tablet TAKE 1 TABLET ONCE A WEEK  WITH A FULL GLASS OF WATER  ON AN EMPTY STOMACH 12 tablet 3   Calcium Carb-Cholecalciferol [phone] MG-UNIT CAPS Take 1 capsule by mouth daily.     diltiazem (CARDIZEM) 30 MG tablet Take 1 tablet every 4 hours AS NEEDED for breakthrough AFIB 45 tablet 1   diltiazem (TIAZAC) 300 MG 24 hr capsule Take 1 capsule (300 mg total) by mouth  daily. 90 capsule 3   ELIQUIS 5 MG TABS tablet TAKE 1 TABLET BY MOUTH  TWICE DAILY 180 tablet 3   flecainide (TAMBOCOR) 100 MG tablet Take 1 tablet (100 mg total) by mouth 2 (two) times daily. 180 tablet 3   LORazepam (ATIVAN) 0.5 MG tablet Take 0.5 mg by mouth daily as needed (when flying).     metoprolol tartrate (LOPRESSOR) 25 MG tablet Take 1 tablet (25 mg total) by mouth 2 (two) times daily. 180 tablet 3   Multiple Vitamins-Minerals (PRESERVISION AREDS PO) Take 1 tablet by mouth 2 (two) times daily.     No current facility-administered medications for this visit.    Physical Exam: Vitals:   05/04/21 0924  BP: 132/68  Pulse: (!) 59  SpO2: 96%  Weight: 108 lb 3.2 oz (49.1 kg)  Height: 5' 4.5" (1.638 m)    GEN- The patient is well appearing, alert and oriented x 3 today.   Head- normocephalic, atraumatic Eyes-  Sclera clear, conjunctiva pink Ears- hearing intact Oropharynx- clear Lungs- Clear to ausculation bilaterally, normal work of breathing Heart- Regular rate and rhythm, 3/6 diastolic murmur LUSB GI- soft, NT, ND, + BS Extremities- no clubbing, cyanosis, or edema  Echo 04/28/21- EF 50-55%, AI is moderate to severe  EKG tracing ordered today is personally reviewed and shows sinus rhythm, PR 188 msec, QTc 450 msec  Assessment and Plan:  1. Paroxysmal atrial fibrillation Doing well s/p ablation She had multiple atypical atrial flutter circuits and left atrial scarring observed during her ablation. chads2vasc score is 3.  Continue eliquis We discussed options of tikosyn and amiodarone at length today.  Risks and benefits to each medicine were discussed.  She would prefer tikosyn. We will arrange tikosyn admission.  She will need to stop flecainide 72 hours prior to admission. Ultimately, she may benefit from MAZE  2. AI Moderate to severe by recent echo (reviewed) Will require close follow-up with Dr Percival Spanish I suspect that her valvular heart disease is impacting our  ability to maintain sinus Would have a low threshold to consider AV repair and MAZE.  3. HTN Continue diltiazem 300mg  daily  We will arrange tikosyn admission through the AF clinic.  Thompson Grayer MD, [facility] 05/04/2021 9:43 AM

## 2021-05-04 NOTE — Patient Instructions (Signed)
Medication Instructions:  Your physician recommends that you continue on your current medications as directed. Please refer to the Current Medication list given to you today.  Labwork: None ordered.  Testing/Procedures: None ordered.  Follow-Up: Your physician wants you to follow-up in: Call the [facility] when you are ready to set up your Tikosyn load.    Any Other Special Instructions Will Be Listed Below (If Applicable).  If you need a refill on your cardiac medications before your next appointment, please call your pharmacy.   Preparing for Tikosyn Admission  1) Check with insurance company for cost of drug       a. Tikosyn (brand) 500 mcg twice a day       b. Dofetilide (generic) 500 mcg twice a day   Patient assistance is available through [employer] if requirements are met.   2) Tikosyn admission requires a 3 day hospital stay with constant telemetry monitoring. You will have an EKG after each dose of Tikosyn. If the drug does not convert you to normal rhythm a cardioversion will be performed prior to discharge.   3) A pharmacist will review all of your medications. If there are any interactions noted we will be in touch with you.   4) Please ensure no missed doses of your anticoagulation (blood thinner) for the past 3 weeks prior to admission.   5) No Benadryl is allowed 3 days prior to admission.   6) On day of admission - you will come in for office visit in the [facility] where [place] will be drawn. IF the labs are acceptable an inpatient bed will be requested.   7) Normally admission to a bed is straight from our office. This is all dependent on bed availability. In some instances you may be sent home and bed placement will call later in the day when a bed is available.   8) You may bring personal belongings/clothing with you to the hospital. Please leave your suitcase in the car until you arrive in admissions.

## 2021-05-05 ENCOUNTER — Telehealth: Payer: Self-pay | Admitting: Pharmacist

## 2021-05-05 NOTE — Telephone Encounter (Signed)
Medication list reviewed in anticipation of upcoming Tikosyn initiation. Patient is not taking any contraindicated or QTc prolonging medications.   She should stop taking flecainide at least 3 days prior to admission.  Patient is anticoagulated on Eliquis on the appropriate dose. Please ensure that patient has not missed any anticoagulation doses in the 3 weeks prior to Tikosyn initiation.   Patient will need to be counseled to avoid use of Benadryl while on Tikosyn and in the 2-3 days prior to Tikosyn initiation.

## 2021-05-06 NOTE — Telephone Encounter (Signed)
Pt notified to stop flecainide 3 days prior to [place] admission

## 2021-05-09 ENCOUNTER — Ambulatory Visit: Payer: Medicare Other | Admitting: Internal Medicine

## 2021-05-09 ENCOUNTER — Other Ambulatory Visit: Payer: Self-pay | Admitting: *Deleted

## 2021-05-09 MED ORDER — DILTIAZEM HCL ER BEADS 300 MG PO CP24: 300.0000 mg | ORAL_CAPSULE | Freq: Every day | ORAL | 3 refills | Status: DC

## 2021-05-18 ENCOUNTER — Telehealth: Payer: Self-pay | Admitting: Pharmacist

## 2021-05-18 ENCOUNTER — Other Ambulatory Visit: Payer: Self-pay

## 2021-05-18 ENCOUNTER — Ambulatory Visit: Payer: Medicare Other | Admitting: Physician Assistant

## 2021-05-18 ENCOUNTER — Encounter: Payer: Self-pay | Admitting: Physician Assistant

## 2021-05-18 VITALS — BP 110/60 | HR 88 | Ht 64.5 in | Wt 107.0 lb

## 2021-05-18 DIAGNOSIS — Z1212 Encounter for screening for malignant neoplasm of rectum: Secondary | ICD-10-CM | POA: Diagnosis not present

## 2021-05-18 DIAGNOSIS — Z1211 Encounter for screening for malignant neoplasm of colon: Secondary | ICD-10-CM | POA: Diagnosis not present

## 2021-05-18 DIAGNOSIS — I480 Paroxysmal atrial fibrillation: Secondary | ICD-10-CM | POA: Diagnosis not present

## 2021-05-18 DIAGNOSIS — I351 Nonrheumatic aortic (valve) insufficiency: Secondary | ICD-10-CM

## 2021-05-18 DIAGNOSIS — R143 Flatulence: Secondary | ICD-10-CM | POA: Diagnosis not present

## 2021-05-18 NOTE — Patient Instructions (Signed)
Can try Gas-X before meals.  If you are age 69 or older, your body mass index should be between 23-30. Your Body mass index is 18.08 kg/m. If this is out of the aforementioned range listed, please consider follow up with your Primary Care Provider.  If you are age 89 or younger, your body mass index should be between 19-25. Your Body mass index is 18.08 kg/m. If this is out of the aformentioned range listed, please consider follow up with your Primary Care Provider.   __________________________________________________________  The [facility] providers would like to encourage you to use [facility] to communicate with providers for non-urgent requests or questions.  Due to long hold times on the telephone, sending your provider a message by [facility] may be a faster and more efficient way to get a response.  Please allow 48 business hours for a response.  Please remember that this is for non-urgent requests.

## 2021-05-18 NOTE — Progress Notes (Signed)
Chief Complaint: Excessive gas  HPI:    [name] [name] is a 70 year old Caucasian female, previously known to Dr. Deatra Ina, with a past medical history as listed below including A. fib on Eliquis (echo 04/28/2021 with LVEF 50-55%, no aortic stenosis) and moderate aortic insufficiency, who was referred to me by Burnis Medin, MD for a complaint of excessive gas.      04/20/2014 colonoscopy with mild diverticulosis in the sigmoid and descending colon internal hemorrhoids and otherwise normal.  It was thought rectal bleeding at that time was due to internal hemorrhoids which were treated with banding.  Repeat recommended in 10 years.    05/04/2021 patient followed with Dr. Rayann Heman with cardiology for her A. fib.  This is uncontrolled at this point.  It was discussed that her aortic insufficiency was moderate to severe by her recent echo and it was suspected that this valvular heart disease was impacting their ability to maintain sinus rhythm.  She was continued on diltiazem and there was a low threshold to consider AV repair and MAZE.    Today, the patient tells me that for the past couple of years she has had an increase in flatulence noting that she still works amongst mostly younger people and "you hate to be the old person among younger people", and was having trouble with these just "slipping out".  Tells me that she did make some dietary changes decreasing caffeine and chocolate as they are having trouble controlling her A. fib, but these dietary changes did not really do much or so it seemed, but over the past 3 months her gas has been better.  She now has no surprises and is better able to control symptoms.  Her bowel movements have stayed normal throughout this entire event.  No blood in her stool or weight loss.    Denies fever, chills, changes in diet or changes in medication.  Past Medical History:  Diagnosis Date   Allergic rhinitis    Aortic insufficiency    CAT SCRATCH 09/10/2009   Qualifier:  Diagnosis of  By: Regis Bill MD, [name]    Clavicle fracture 3/08   left   Diverticulosis    HTN (hypertension)    Osteopenia    Paroxysmal atrial fibrillation (HCC)    Vision disturbance 03/13/2013   Negative neuro MRA MRI    Past Surgical History:  Procedure Laterality Date   ABDOMINAL HYSTERECTOMY     pt denies 04/15/14   APPENDECTOMY     ATRIAL FIBRILLATION ABLATION N/A 02/01/2021   Procedure: ATRIAL FIBRILLATION ABLATION;  Surgeon: Thompson Grayer, MD;  Location: [facility];  Service: Cardiovascular;  Laterality: N/A;   LAPAROSCOPIC SALPINGO OOPHERECTOMY Right    TONSILLECTOMY AND ADENOIDECTOMY      Current Outpatient Medications  Medication Sig Dispense Refill   acetaminophen (TYLENOL) 500 MG tablet Take 1,000 mg by mouth every 6 (six) hours as needed for moderate pain.     alendronate (FOSAMAX) 70 MG tablet TAKE 1 TABLET ONCE A WEEK  WITH A FULL GLASS OF WATER  ON AN EMPTY STOMACH 12 tablet 3   Calcium Carb-Cholecalciferol [phone] MG-UNIT CAPS Take 1 capsule by mouth daily.     diltiazem (CARDIZEM) 30 MG tablet Take 1 tablet every 4 hours AS NEEDED for breakthrough AFIB 45 tablet 1   diltiazem (TIAZAC) 300 MG 24 hr capsule Take 1 capsule (300 mg total) by mouth daily. 90 capsule 3   ELIQUIS 5 MG TABS tablet TAKE 1 TABLET BY  MOUTH  TWICE DAILY 180 tablet 3   flecainide (TAMBOCOR) 100 MG tablet Take 1 tablet (100 mg total) by mouth 2 (two) times daily. 180 tablet 3   LORazepam (ATIVAN) 0.5 MG tablet Take 0.5 mg by mouth daily as needed (when flying).     metoprolol tartrate (LOPRESSOR) 25 MG tablet Take 1 tablet (25 mg total) by mouth 2 (two) times daily. 180 tablet 3   Multiple Vitamins-Minerals (PRESERVISION AREDS PO) Take 1 tablet by mouth 2 (two) times daily.     No current facility-administered medications for this visit.    Allergies as of 05/18/2021 - Review Complete 05/18/2021  Allergen Reaction Noted   Latex Hives 03/02/2021   Codeine phosphate  08/23/2005    Demerol [meperidine] Nausea And Vomiting 04/15/2014   Other  05/18/2021   Protonix [pantoprazole] Rash 02/07/2021   Sulfa antibiotics Rash 10/09/2016   Tens therapy replace back pads Itching and Rash 02/01/2021    Family History  Problem Relation Age of Onset   Scleroderma Mother    Hyperlipidemia Mother    Coronary artery disease Father 57   Esophageal cancer Paternal Grandmother     Social History   Socioeconomic History   Marital status: Married    Spouse name: Not on file   Number of children: 0   Years of education: Not on file   Highest education level: Not on file  Occupational History   Occupation: Veterinary surgeon   Occupation: REPORTER    Employer: [employer]  Tobacco Use   Smoking status: Never   Smokeless tobacco: Never  Substance and Sexual Activity   Alcohol use: No    Comment: never   Drug use: No   Sexual activity: Not on file  Other Topics Concern   Not on file  Social History Narrative   Married   hh of 2    p0g0   Works for [employer]    Social Determinants of Radio broadcast assistant Strain: Low Risk    Difficulty of Paying Living Expenses: Not hard at all  Food Insecurity: Not on file  Transportation Needs: No Transportation Needs   Lack of Transportation (Medical): No   Lack of Transportation (Non-Medical): No  Physical Activity: Not on file  Stress: Not on file  Social Connections: Not on file  Intimate Partner Violence: Not on file    Review of Systems:    Constitutional: No weight loss, fever or chills Skin: No rash  Cardiovascular: No chest pain Respiratory: No SOB  Gastrointestinal: See HPI and otherwise negative Genitourinary: No dysuria  Neurological: No headache, dizziness or syncope Musculoskeletal: No new muscle or joint pain Hematologic: No bleeding  Psychiatric: No history of depression or anxiety   Physical Exam:  Vital signs: BP 110/60   Pulse 88   Ht 5' 4.5" (1.638 m)   Wt 107 lb (48.5 kg)    BMI 18.08 kg/m   Constitutional:   Pleasant Caucasian female appears to be in NAD, Well developed, Well nourished, alert and cooperative Head:  Normocephalic and atraumatic. Eyes:   PEERL, EOMI. No icterus. Conjunctiva pink. Ears:  Normal auditory acuity. Neck:  Supple Throat: Oral cavity and pharynx without inflammation, swelling or lesion.  Respiratory: Respirations even and unlabored. Lungs clear to auscultation bilaterally.   No wheezes, crackles, or rhonchi.  Cardiovascular: Normal S1, S2. No MRG. Irregularly irregular, No peripheral edema, cyanosis or pallor.  Gastrointestinal:  Soft, nondistended, nontender. No rebound or guarding. Mildly increased BS  all four quadrants. No appreciable masses or hepatomegaly. Rectal:  Not performed.  Msk:  Symmetrical without gross deformities. Without edema, no deformity or joint abnormality.  Neurologic:  Alert and  oriented x4;  grossly normal neurologically.  Skin:   Dry and intact without significant lesions or rashes. Psychiatric: Demonstrates good judgement and reason without abnormal affect or behaviors.  RELEVANT LABS AND IMAGING: CBC    Component Value Date/Time   WBC 6.0 01/10/2021 0755   WBC 5.5 05/17/2018 0912   RBC 4.13 01/10/2021 0755   RBC 4.54 05/17/2018 0912   HGB 13.7 01/10/2021 0755   HCT 40.6 01/10/2021 0755   PLT 226 01/10/2021 0755   MCV 98 (H) 01/10/2021 0755   MCH 33.2 (H) 01/10/2021 0755   MCH 32.4 04/14/2014 1139   MCHC 33.7 01/10/2021 0755   MCHC 34.1 05/17/2018 0912   RDW 11.5 (L) 01/10/2021 0755   LYMPHSABS 1.3 01/10/2021 0755   MONOABS 0.5 05/17/2018 0912   EOSABS 0.1 01/10/2021 0755   BASOSABS 0.0 01/10/2021 0755    CMP     Component Value Date/Time   NA 141 01/10/2021 0755   K 4.0 01/10/2021 0755   CL 103 01/10/2021 0755   CO2 24 01/10/2021 0755   GLUCOSE 71 01/10/2021 0755   GLUCOSE 71 09/28/2020 0822   BUN 22 01/10/2021 0755   CREATININE 0.86 01/10/2021 0755   CREATININE 0.82 09/28/2020  0822   CALCIUM 9.7 01/10/2021 0755   PROT 7.4 05/17/2018 0912   ALBUMIN 4.5 05/17/2018 0912   AST 21 05/17/2018 0912   ALT 23 05/17/2018 0912   ALKPHOS 37 (L) 05/17/2018 0912   BILITOT 0.7 05/17/2018 0912   GFRNONAA 74 09/28/2020 0822   GFRAA 86 09/28/2020 0822    Assessment: 1.  Excessive flatulence: Over the past couple of years patient's has noticed increased flatulence, but over the past 3 months this has seemed to improve, did make a few dietary changes; consider relation to diet versus age versus possibly SIBO 2.  Screening for colorectal cancer: Patient's last colonoscopy was in 2015 with recommendations for repeat in 10 years given that it was entirely normal 3.  A. fib and moderate aortic insufficiency: Patient on chronic anticoagulation with Eliquis and currently undergoing work-up with cardiology for her uncontrolled A. fib and moderate aortic insufficiency  Plan: 1.  Discussed with the patient a low gas producing diet and provided her with a handout.  Answered questions. 2.  Explained the patient can try Gas-X before meals. 3.  Explained to the patient that I am glad that symptoms are improving, there has been no change in bowel habits or blood in her stool or weight loss, there is no reason to expedite her colonoscopy unless she experiences one of the symptoms. 4.  Did discuss possible SIBO breath testing but the patient tells me she is undergoing work-up from cardiology for uncontrolled A. fib and would like to wait until she finishes this work-up and hopefully gets the problem resolved before pursuing any further testing.  Explained that if she wants to have this done anytime in the next 6 months that she can call and let us know and we can order for her without her having to come back in for an appointment. 5.  Patient to follow in clinic as needed, she was assigned to Dr. Rush Landmark this morning.  Ellouise Newer, PA-C [facility] 05/18/2021, 8:32 AM  Cc:  Burnis Medin, MD

## 2021-05-18 NOTE — Chronic Care Management (AMB) (Signed)
Chronic Care Management Pharmacy Assistant   Name: ANJELIQUE MAKAR  MRN: [id number] DOB: 10-Apr-1951  Reason for Encounter: Disease State   Conditions to be addressed/monitored: HTN  Recent office visits:  None  Recent consult visits:  07.06.2022 Thompson Grayer, MD Cardiology patient present for follow up visit on A-fib 06.24.2022 Minus Breeding, MD Cardiology patient present for follow up on palpitations and aortic insufficiency. 06.09.2022 Telephone call (Cardiology) patient trying medication Diltiazem HCl 30 MG Take 1 tablet every 4 hours AS NEEDED for breakthrough AFIB 05.04.2022 Sherran Needs, NP Cardiology patient present for follow up visit on [facility] visits:  None in previous 6 months  Medications: Outpatient Encounter Medications as of 05/18/2021  Medication Sig   acetaminophen (TYLENOL) 500 MG tablet Take 1,000 mg by mouth every 6 (six) hours as needed for moderate pain.   alendronate (FOSAMAX) 70 MG tablet TAKE 1 TABLET ONCE A WEEK  WITH A FULL GLASS OF WATER  ON AN EMPTY STOMACH   Calcium Carb-Cholecalciferol [phone] MG-UNIT CAPS Take 1 capsule by mouth daily.   diltiazem (CARDIZEM) 30 MG tablet Take 1 tablet every 4 hours AS NEEDED for breakthrough AFIB   diltiazem (TIAZAC) 300 MG 24 hr capsule Take 1 capsule (300 mg total) by mouth daily.   ELIQUIS 5 MG TABS tablet TAKE 1 TABLET BY MOUTH  TWICE DAILY   flecainide (TAMBOCOR) 100 MG tablet Take 1 tablet (100 mg total) by mouth 2 (two) times daily.   LORazepam (ATIVAN) 0.5 MG tablet Take 0.5 mg by mouth daily as needed (when flying).   metoprolol tartrate (LOPRESSOR) 25 MG tablet Take 1 tablet (25 mg total) by mouth 2 (two) times daily.   Multiple Vitamins-Minerals (PRESERVISION AREDS PO) Take 1 tablet by mouth 2 (two) times daily.   No facility-administered encounter medications on file as of 05/18/2021.   Reviewed chart prior to disease state call. Spoke with patient regarding BP  Recent Office  Vitals: BP Readings from Last 3 Encounters:  05/18/21 110/60  05/04/21 132/68  04/22/21 (!) 152/68   Pulse Readings from Last 3 Encounters:  05/18/21 88  05/04/21 (!) 59  04/22/21 60    Wt Readings from Last 3 Encounters:  05/18/21 107 lb (48.5 kg)  05/04/21 108 lb 3.2 oz (49.1 kg)  04/22/21 108 lb 9.6 oz (49.3 kg)     Kidney Function Lab Results  Component Value Date/Time   CREATININE 0.86 01/10/2021 07:55 AM   CREATININE 0.82 09/28/2020 08:22 AM   CREATININE 0.75 11/20/2019 08:06 AM   GFR 84.89 05/17/2018 09:12 AM   GFRNONAA 74 09/28/2020 08:22 AM   GFRAA 86 09/28/2020 08:22 AM    BMP Latest Ref Rng & Units 01/10/2021 09/28/2020 11/20/2019  Glucose 65 - 99 mg/dL 71 71 94  BUN 8 - 27 mg/dL 22 21 21   Creatinine 0.57 - 1.00 mg/dL 0.86 0.82 0.75  BUN/Creat Ratio 12 - 28 26 NOT APPLICABLE NOT APPLICABLE  Sodium 193 - 144 mmol/L 141 140 143  Potassium 3.5 - 5.2 mmol/L 4.0 4.4 4.1  Chloride 96 - 106 mmol/L 103 104 105  CO2 20 - 29 mmol/L 24 29 30   Calcium 8.7 - 10.3 mg/dL 9.7 9.3 9.6   Current antihypertensive regimen:  Diltiazem 300 mg 1 capsule daily Metoprolol tartrate 25 mg 1 tablet twice daily How often are you checking your Blood Pressure? weekly Current home BP readings:  06.24 152/68 07.06 132/68 07.07 130/64 What recent interventions/DTPs have been made by any  provider to improve Blood Pressure control since last CPP Visit:  Diltiazem HCl 30 MG Take 1 tablet every 4 hours AS NEEDED for breakthrough AFIB Any recent hospitalizations or ED visits since last visit with CPP? No What diet changes have been made to improve Blood Pressure Control?  No change What exercise is being done to improve your Blood Pressure Control?  A little less due to heart condition  Adherence Review: Is the patient currently on ACE/ARB medication? No Does the patient have >5 day gap between last estimated fill dates? No  I called and spoke with the patient about medication adherence.  She states that she has been doing fair. She recently was prescribed Diltiazem HCl 30 mg and take 1 tablet every 4 hours as needed. She states that she has been using this medication quite frequently. She states that she is not experiencing any side effects from her current medication. There have been no other recent changes. She stated that she has a procedure on August the 2nd 2022. There have been no recent urgent care hospital or emergency department visits. She continues her same diet, but she has less physical activity. Her next CCM appointment is scheduled for October 2022 period She currently [facility] for her pharmacy and she is not having any issues.  Care Gaps: TDAP  Star Rating Drugs: None   Amilia Revonda Standard, [place] Pharmacist Assistant [phone]

## 2021-05-18 NOTE — Progress Notes (Signed)
Attending Physician's Attestation   I have reviewed the chart.   I agree with the Advanced Practitioner's note, impression, and recommendations with any updates as below.    Mourad Cwikla [name], MD [facility]oscopy Office # [phone]  

## 2021-05-20 ENCOUNTER — Other Ambulatory Visit: Payer: Self-pay | Admitting: Cardiology

## 2021-05-20 ENCOUNTER — Encounter (HOSPITAL_COMMUNITY): Payer: Self-pay

## 2021-05-20 NOTE — Telephone Encounter (Signed)
Prescription refill request for Eliquis received. Indication: Afib Last office visit: 05/04/21 (Dr Rayann Heman) Scr: 0.86 (01/10/21) Age: 70 Weight: 48.5kg

## 2021-05-29 ENCOUNTER — Telehealth: Payer: Self-pay | Admitting: Student in an Organized Health Care Education/Training Program

## 2021-05-29 NOTE — Telephone Encounter (Signed)
Paged by operator regarding AF. Planned for tikosyn load Tuesday with Dr. Rayann Heman. Off flecainide since Friday. Took diltiazem 30 mg PO IR overnight as she was in AF with RVR at 1245 AM. Between [id number] this morning she could feel her AF. Checked with Jodelle Red and it was 127. BP reading said the same thing. Said it wasn't AF. Kardia then said AF at HR 132. Currently HR 60s. She can tell when she is in AF even when < 100, sometimes fatigue following this. Still takes diltiazem CD 300 mg PO daily along with metop tartrate 25 mg PO bid.   Asked her to take all of her AM meds now with long acting dilt and metop tartrate. Asked her to take dilt IR if HR >110 and to call us if >150. She is agreeable to the plan.

## 2021-05-30 ENCOUNTER — Other Ambulatory Visit: Payer: Self-pay | Admitting: Internal Medicine

## 2021-05-30 LAB — SARS CORONAVIRUS 2 (TAT 6-24 HRS): SARS Coronavirus 2: NEGATIVE

## 2021-05-31 ENCOUNTER — Other Ambulatory Visit: Payer: Self-pay

## 2021-05-31 ENCOUNTER — Inpatient Hospital Stay (HOSPITAL_COMMUNITY)
Admission: RE | Admit: 2021-05-31 | Discharge: 2021-06-03 | DRG: 310 | Disposition: A | Discharge status: Home / Self Care | Payer: Medicare Other | Source: Ambulatory Visit | Attending: Internal Medicine | Admitting: Internal Medicine

## 2021-05-31 ENCOUNTER — Encounter (HOSPITAL_COMMUNITY): Payer: Self-pay | Admitting: Physician Assistant

## 2021-05-31 ENCOUNTER — Ambulatory Visit (HOSPITAL_COMMUNITY)
Admission: RE | Admit: 2021-05-31 | Discharge: 2021-05-31 | Disposition: A | Discharge status: Home / Self Care | Payer: Medicare Other | Source: Ambulatory Visit | Attending: Physician Assistant | Admitting: Physician Assistant

## 2021-05-31 VITALS — BP 128/60 | HR 64 | Ht 64.5 in | Wt 109.0 lb

## 2021-05-31 DIAGNOSIS — Z90710 Acquired absence of both cervix and uterus: Secondary | ICD-10-CM

## 2021-05-31 DIAGNOSIS — I10 Essential (primary) hypertension: Secondary | ICD-10-CM | POA: Diagnosis not present

## 2021-05-31 DIAGNOSIS — D6869 Other thrombophilia: Secondary | ICD-10-CM

## 2021-05-31 DIAGNOSIS — Z8249 Family history of ischemic heart disease and other diseases of the circulatory system: Secondary | ICD-10-CM

## 2021-05-31 DIAGNOSIS — Z7901 Long term (current) use of anticoagulants: Secondary | ICD-10-CM

## 2021-05-31 DIAGNOSIS — Z79899 Other long term (current) drug therapy: Secondary | ICD-10-CM

## 2021-05-31 DIAGNOSIS — Z800 Family history of malignant neoplasm of digestive organs: Secondary | ICD-10-CM | POA: Diagnosis not present

## 2021-05-31 DIAGNOSIS — I480 Paroxysmal atrial fibrillation: Secondary | ICD-10-CM | POA: Diagnosis not present

## 2021-05-31 DIAGNOSIS — Z20822 Contact with and (suspected) exposure to covid-19: Secondary | ICD-10-CM | POA: Diagnosis present

## 2021-05-31 DIAGNOSIS — I061 Rheumatic aortic insufficiency: Secondary | ICD-10-CM | POA: Diagnosis not present

## 2021-05-31 LAB — BASIC METABOLIC PANEL
Anion gap: 10 (ref 5–15)
Anion gap: 6 (ref 5–15)
BUN: 22 mg/dL (ref 8–23)
BUN: 18 mg/dL (ref 8–23)
CO2: 26 mmol/L (ref 22–32)
CO2: 29 mmol/L (ref 22–32)
Calcium: 9.5 mg/dL (ref 8.9–10.3)
Calcium: 9.5 mg/dL (ref 8.9–10.3)
Chloride: 102 mmol/L (ref 98–111)
Chloride: 103 mmol/L (ref 98–111)
Creatinine, Ser: 0.76 mg/dL (ref 0.44–1.00)
Creatinine, Ser: 0.78 mg/dL (ref 0.44–1.00)
GFR, Estimated: >60 mL/min (ref >60)
GFR, Estimated: >60 mL/min (ref >60)
Glucose, Bld: 106 mg/dL — ABNORMAL HIGH (ref 70–99)
Glucose, Bld: 99 mg/dL (ref 70–99)
Potassium: 3.5 mmol/L (ref 3.5–5.1)
Potassium: 3.8 mmol/L (ref 3.5–5.1)
Sodium: 138 mmol/L (ref 135–145)
Sodium: 138 mmol/L (ref 135–145)

## 2021-05-31 LAB — MAGNESIUM: Magnesium: 2 mg/dL (ref 1.7–2.4)

## 2021-05-31 LAB — HIV ANTIBODY (ROUTINE TESTING W REFLEX): HIV Screen 4th Generation wRfx: NONREACTIVE

## 2021-05-31 LAB — POTASSIUM: Potassium: 3.5 mmol/L (ref 3.5–5.1)

## 2021-05-31 MED ORDER — POTASSIUM CHLORIDE CRYS ER 20 MEQ PO TBCR
40.0000 meq | EXTENDED_RELEASE_TABLET | Freq: Once | ORAL | Status: AC
  Administered 2021-05-31: 40 meq via ORAL
  Filled 2021-05-31: qty 2

## 2021-05-31 MED ORDER — POTASSIUM CHLORIDE CRYS ER 20 MEQ PO TBCR
60.0000 meq | EXTENDED_RELEASE_TABLET | Freq: Once | ORAL | Status: AC
  Administered 2021-05-31: 60 meq via ORAL
  Filled 2021-05-31: qty 3

## 2021-05-31 MED ORDER — SODIUM CHLORIDE 0.9 % IV SOLN
250.0000 mL | INTRAVENOUS | Status: DC | PRN
  Administered 2021-05-31: 250 mL via INTRAVENOUS

## 2021-05-31 MED ORDER — APIXABAN 5 MG PO TABS
5.0000 mg | ORAL_TABLET | Freq: Two times a day (BID) | ORAL | Status: DC
  Administered 2021-05-31 – 2021-06-03 (×6): 5 mg via ORAL
  Filled 2021-05-31 (×6): qty 1

## 2021-05-31 MED ORDER — DILTIAZEM HCL 60 MG PO TABS
30.0000 mg | ORAL_TABLET | ORAL | Status: DC | PRN
  Filled 2021-05-31: qty 1

## 2021-05-31 MED ORDER — CALCIUM CARBONATE-VITAMIN D 500-200 MG-UNIT PO TABS
1.0000 | ORAL_TABLET | Freq: Every day | ORAL | Status: DC
  Administered 2021-06-01 – 2021-06-03 (×3): 1 via ORAL
  Filled 2021-05-31 (×3): qty 1

## 2021-05-31 MED ORDER — DILTIAZEM HCL ER COATED BEADS 300 MG PO CP24
300.0000 mg | ORAL_CAPSULE | Freq: Every day | ORAL | Status: DC
  Administered 2021-06-01 – 2021-06-03 (×3): 300 mg via ORAL
  Filled 2021-05-31 (×3): qty 1

## 2021-05-31 MED ORDER — DOFETILIDE 250 MCG PO CAPS
250.0000 ug | ORAL_CAPSULE | Freq: Two times a day (BID) | ORAL | Status: DC
  Administered 2021-05-31: 250 ug via ORAL
  Filled 2021-05-31: qty 1

## 2021-05-31 MED ORDER — POTASSIUM CHLORIDE CRYS ER 20 MEQ PO TBCR: 60.0000 meq | EXTENDED_RELEASE_TABLET | Freq: Once | ORAL | Status: DC

## 2021-05-31 MED ORDER — PROSIGHT PO TABS
1.0000 | ORAL_TABLET | Freq: Two times a day (BID) | ORAL | Status: DC
  Administered 2021-05-31 – 2021-06-03 (×6): 1 via ORAL
  Filled 2021-05-31 (×6): qty 1

## 2021-05-31 MED ORDER — MAGNESIUM SULFATE 2 GM/50ML IV SOLN
2.0000 g | Freq: Once | INTRAVENOUS | Status: AC
  Administered 2021-05-31: 2 g via INTRAVENOUS
  Filled 2021-05-31: qty 50

## 2021-05-31 MED ORDER — METOPROLOL TARTRATE 25 MG PO TABS
25.0000 mg | ORAL_TABLET | Freq: Two times a day (BID) | ORAL | Status: DC
  Administered 2021-05-31 – 2021-06-03 (×6): 25 mg via ORAL
  Filled 2021-05-31 (×6): qty 1

## 2021-05-31 MED ORDER — ACETAMINOPHEN 500 MG PO TABS: 1000.0000 mg | ORAL_TABLET | Freq: Four times a day (QID) | ORAL | Status: DC | PRN

## 2021-05-31 MED ORDER — SODIUM CHLORIDE 0.9% FLUSH
3.0000 mL | Freq: Two times a day (BID) | INTRAVENOUS | Status: DC
  Administered 2021-05-31 – 2021-06-03 (×5): 3 mL via INTRAVENOUS

## 2021-05-31 MED ORDER — SODIUM CHLORIDE 0.9% FLUSH: 3.0000 mL | INTRAVENOUS | Status: DC | PRN

## 2021-05-31 NOTE — Progress Notes (Addendum)
Pt here for tiksoyn load. K+ noted 3.5 Pharmacy has ordered 6mq replacement already and a repeat K+ level at 1800.  Agree with this plan, follow pharmacy protocol   RTommye Standard PA-C

## 2021-05-31 NOTE — TOC Benefit Eligibility Note (Signed)
Transition of Care [facility]) Benefit Eligibility Note    Patient Details  Name: [name] [name] MRN: [id number] Date of Birth: 1951/10/10 [name]. [facility].Ph# [phone]  Medication/Dose: Dofetilide 55mg.,2553m.,125mcg. bid daily  Covered?: Yes     Prescription Coverage Preferred Pharmacy: [facility]    Co-Pay: $47.00  Prior Approval: No  Deductible:  (no deductible)       [name] Number: 05/31/2021, 4:53 PM

## 2021-05-31 NOTE — Progress Notes (Addendum)
Pharmacy: Dofetilide (Tikosyn) - Initial Consult Assessment and Electrolyte Replacement  Pharmacy consulted to assist in monitoring and replacing electrolytes in this 70 y.o. female admitted on 05/31/2021 undergoing dofetilide initiation. First dofetilide dose: 250 mg  Assessment:  Patient Exclusion Criteria: If any screening criteria checked as "Yes", then  patient  should NOT receive dofetilide until criteria item is corrected.  If "Yes" please indicate correction plan.  YES  NO Patient  Exclusion Criteria Correction Plan   '[x]'$   '[]'$   Baseline QTc interval is greater than or equal to 440 msec. IF above YES box checked dofetilide contraindicated unless patient has ICD; then may proceed if QTc 500-550 msec or with known ventricular conduction abnormalities may proceed with QTc 550-600 msec. QTc = 0.46    '[]'$   '[x]'$   Patient is known or suspected to have a digoxin level greater than 2 ng/ml: No results found for: DIGOXIN     '[]'$   '[x]'$   Creatinine clearance less than 20 ml/min (calculated using Cockcroft-Gault, actual body weight and serum creatinine): Estimated Creatinine Clearance: 52.5 mL/min (by C-G formula based on SCr of 0.76 mg/dL).     '[]'$   '[x]'$  Patient has received drugs known to prolong the QT intervals within the last 48 hours (phenothiazines, tricyclics or tetracyclic antidepressants, erythromycin, H-1 antihistamines, cisapride, fluoroquinolones, azithromycin). Drugs not listed above may have an, as yet, undetected potential to prolong the QT interval, updated information on QT prolonging agents is available at this website:QT prolonging agents or www.crediblemeds.org    '[]'$   '[x]'$   Patient received a dose of hydrochlorothiazide (Oretic) alone or in any combination including triamterene (Dyazide, Maxzide) in the last 48 hours.    '[]'$   '[x]'$  Patient received a medication known to increase dofetilide plasma concentrations prior to initial dofetilide dose:  Trimethoprim (Primsol,  Proloprim) in the last 36 hours Verapamil (Calan, Verelan) in the last 36 hours or a sustained release dose in the last 72 hours Megestrol (Megace) in the last 5 days  Cimetidine (Tagamet) in the last 6 hours Ketoconazole (Nizoral) in the last 24 hours Itraconazole (Sporanox) in the last 48 hours  Prochlorperazine (Compazine) in the last 36 hours     '[]'$   '[x]'$   Patient is known to have a history of torsades de pointes; congenital or acquired long QT syndromes.    '[]'$   '[x]'$   Patient has received a Class 1 antiarrhythmic with less than 2 half-lives since last dose. (Disopyramide, Quinidine, Procainamide, Lidocaine, Mexiletine, Flecainide, Propafenone)    '[]'$   '[x]'$   Patient has received amiodarone therapy in the past 3 months or amiodarone level is greater than 0.3 ng/ml.    Patient has been appropriately anticoagulated with apixaban.  Labs:    Component Value Date/Time   K 3.5 05/31/2021 1745   MG 2.0 05/31/2021 1123     Plan: Potassium: K 3.5-3.7:  Hold Tikosyn initiation and give KCl 60 mEq po x1 and repeat BMET with am labs - repeat appropriate dose if K < 4    Magnesium: Mg >2: Appropriate to initiate Tikosyn, no replacement needed    ADDENDUM:  Pharmacy spoke with Tommye Standard who felt that the initial 60 meq of potassium had not had adequate time to be absorbed prior to the lab draw.  Tommye Standard wanted [place] to give an additional 40 meq of potassium po x 1 and draw a repeat BMP at 2100. If improved follow protocol and give Tikosyn up to 2300.  If not improved, call EP on-call.  ADDENDUM 2:  2230 - K now 3.8, will give 40 meq po again and start Tikosyn,  will need to slowly adjust Tikosyn times to get back on schedule due to tonight's delay   Thank you for allowing pharmacy to participate in this patient's care   Alanda Slim, PharmD, [place] Pharmacist Please see AMION for all Pharmacists' Contact Phone Numbers 05/31/2021, 10:33 PM

## 2021-05-31 NOTE — Care Management (Signed)
05-31-21 1551 Patient presented for Tikosyn Load. Benefits check submitted for Tikosyn. Case Manager will follow for cost and pharmacy of choice.

## 2021-05-31 NOTE — Progress Notes (Signed)
Primary Care Physician: Burnis Medin, MD Referring Physician: Dr. Percival Spanish EP: Dr. 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 female with a h/o HTN, paroxysmal afib, initially diagnosed with afib for several years. She is on rate control/flecainde  meds as well as eliquis 5 mg for a chadsvasc score  of 3. She is in the afib clinic to f/u ablation one month ago. She did have some episodes the second week of ablation but sine 18th of April she has not had anymore episodes. She did have an allergic reaction on her torso, probaly form the pads that were placed on chest for cardioversion. It has resolved. No swallowing or groin issues. She is back to her usual activities.    Follow up in the AF clinic 05/31/21. Patient presents for dofetilide admission. She reports that she has had afib about every other day. She has stopped flecainide 3 days ago. She denies any missed doses of anticoagulation.   Today, she denies symptoms of chest pain, shortness of breath, orthopnea, PND, lower extremity edema, dizziness, presyncope, syncope, or neurologic sequela. The patient is tolerating medications without difficulties and is otherwise without complaint today.   Past Medical History:  Diagnosis Date   Allergic rhinitis    Aortic insufficiency    CAT SCRATCH 09/10/2009   Qualifier: Diagnosis of  By: Regis Bill MD, [name]    Clavicle fracture 3/08   left   Diverticulosis    HTN (hypertension)    Osteopenia    Paroxysmal atrial fibrillation (HCC)    Vision disturbance 03/13/2013   Negative neuro MRA MRI   Past Surgical History:  Procedure Laterality Date   ABDOMINAL HYSTERECTOMY     pt denies 04/15/14   APPENDECTOMY     ATRIAL FIBRILLATION ABLATION N/A 02/01/2021   Procedure: ATRIAL FIBRILLATION ABLATION;  Surgeon: Thompson Grayer, MD;  Location: [facility];  Service: Cardiovascular;  Laterality: N/A;   LAPAROSCOPIC SALPINGO OOPHERECTOMY Right    TONSILLECTOMY AND ADENOIDECTOMY      Current Outpatient  Medications  Medication Sig Dispense Refill   acetaminophen (TYLENOL) 500 MG tablet Take 1,000 mg by mouth every 6 (six) hours as needed for moderate pain.     alendronate (FOSAMAX) 70 MG tablet TAKE 1 TABLET ONCE A WEEK  WITH A FULL GLASS OF WATER  ON AN EMPTY STOMACH 12 tablet 3   apixaban (ELIQUIS) 5 MG TABS tablet TAKE 1 TABLET BY MOUTH  TWICE DAILY 180 tablet 1   Calcium Carb-Cholecalciferol [id number] MG-UNIT CAPS Take 1 capsule by mouth daily.     diltiazem (CARDIZEM) 30 MG tablet Take 1 tablet every 4 hours AS NEEDED for breakthrough AFIB 45 tablet 1   diltiazem (TIAZAC) 300 MG 24 hr capsule Take 1 capsule (300 mg total) by mouth daily. 90 capsule 3   LORazepam (ATIVAN) 0.5 MG tablet Take 0.5 mg by mouth daily as needed (when flying).     metoprolol tartrate (LOPRESSOR) 25 MG tablet Take 1 tablet (25 mg total) by mouth 2 (two) times daily. 180 tablet 3   Multiple Vitamins-Minerals (PRESERVISION AREDS PO) Take 1 tablet by mouth 2 (two) times daily.     No current facility-administered medications for this encounter.    Allergies  Allergen Reactions   Latex Hives   Codeine Phosphate     drowsy   Demerol [Meperidine] Nausea And Vomiting   Other     Adhesive pads left on skin for extended periods   Protonix [Pantoprazole]  Rash   Sulfa Antibiotics Rash   Tens Therapy Replace Back Pads Itching and Rash    Social History   Socioeconomic History   Marital status: Married    Spouse name: Not on file   Number of children: 0   Years of education: Not on file   Highest education level: Not on file  Occupational History   Occupation: Veterinary surgeon   Occupation: REPORTER    Employer: [employer]  Tobacco Use   Smoking status: Never   Smokeless tobacco: Never  Substance and Sexual Activity   Alcohol use: No    Comment: never   Drug use: No   Sexual activity: Not on file  Other Topics Concern   Not on file  Social History Narrative   Married   hh of 2    p0g0    Works for [employer]    Social Determinants of Radio broadcast assistant Strain: Low Risk    Difficulty of Paying Living Expenses: Not hard at all  Food Insecurity: Not on file  Transportation Needs: No Transportation Needs   Lack of Transportation (Medical): No   Lack of Transportation (Non-Medical): No  Physical Activity: Not on file  Stress: Not on file  Social Connections: Not on file  Intimate Partner Violence: Not on file    Family History  Problem Relation Age of Onset   Scleroderma Mother    Hyperlipidemia Mother    Coronary artery disease Father 23   Esophageal cancer Paternal Grandmother     ROS- All systems are reviewed and negative except as per the HPI above  Physical Exam: Vitals:   05/31/21 1117  BP: 128/60  Pulse: 64  Weight: 49.4 kg  Height: 5' 4.5" (1.638 m)    Wt Readings from Last 3 Encounters:  05/31/21 49.4 kg  05/18/21 48.5 kg  05/04/21 49.1 kg    Labs: Lab Results  Component Value Date   NA 141 01/10/2021   K 4.0 01/10/2021   CL 103 01/10/2021   CO2 24 01/10/2021   GLUCOSE 71 01/10/2021   BUN 22 01/10/2021   CREATININE 0.86 01/10/2021   CALCIUM 9.7 01/10/2021   No results found for: INR Lab Results  Component Value Date   CHOL 226 (H) 05/17/2018   HDL 79.30 05/17/2018   LDLCALC 129 (H) 05/17/2018   TRIG 84.0 05/17/2018    GEN- The patient is a well appearing female, alert and oriented x 3 today.   HEENT-head normocephalic, atraumatic, sclera clear, conjunctiva pink, hearing intact, trachea midline. Lungs- Clear to ausculation bilaterally, normal work of breathing Heart- Regular rate and rhythm, no rubs or gallops, 2/6 diastolic murmur   GI- soft, NT, ND, + BS Extremities- no clubbing, cyanosis, or edema MS- no significant deformity or atrophy Skin- no rash or lesion Psych- euthymic mood, full affect Neuro- strength and sensation are intact   EKG- SR, NST Vent. rate 64 BPM PR interval 154 ms QRS duration 92  ms QT/QTcB 444/458 ms   Assessment and Plan: 1. Paroxysmal afib S/p ablation 02/01/21, multiple atrial flutter circuits noted. Patient presents for dofetilide admission.  Patient aware of price of dofetilide. Continue Eliquis 5 mg BID, states no missed doses in the last 3 weeks. No recent benadryl use PharmD has screened medications, flecainide discontinued.  QTc in SR 458 ms Labs today show creatinine at 0.76, K+ 3.5 and mag 2.0, CrCl calculated at 65 mL/min Continue metoprolol 25 mg bid  Continue diltiazem 300  mg daily with 30 mg PRN q 4 hours for heart racing.  2. CHA2DS2VASc score of at least 3 Continue  eliquis 5 mg bid    3. AI If AV repair is indicated, could consider MAZE at that time.  4. HTN Stable, no changes today.   To be admitted later today once a bed becomes available.    [facility] [address] [place], [place] [address] [phone]

## 2021-05-31 NOTE — H&P (Signed)
Primary Care Physician: Burnis Medin, MD Referring Physician: Dr. Percival Spanish EP: Dr. 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 female with a h/o HTN, paroxysmal afib, initially diagnosed with afib for several years. She is on rate control/flecainde  meds as well as eliquis 5 mg for a chadsvasc score  of 3. She is in the afib clinic to f/u ablation one month ago. She did have some episodes the second week of ablation but sine 18th of April she has not had anymore episodes. She did have an allergic reaction on her torso, probaly form the pads that were placed on chest for cardioversion. It has resolved. No swallowing or groin issues. She is back to her usual activities.    Follow up in the AF clinic 05/31/21. Patient presents for dofetilide admission. She reports that she has had afib about every other day. She has stopped flecainide 3 days ago. She denies any missed doses of anticoagulation.   Today, she denies symptoms of chest pain, shortness of breath, orthopnea, PND, lower extremity edema, dizziness, presyncope, syncope, or neurologic sequela. The patient is tolerating medications without difficulties and is otherwise without complaint today.   Past Medical History:  Diagnosis Date   Allergic rhinitis    Aortic insufficiency    CAT SCRATCH 09/10/2009   Qualifier: Diagnosis of  By: Regis Bill MD, [name]    Clavicle fracture 3/08   left   Diverticulosis    HTN (hypertension)    Osteopenia    Paroxysmal atrial fibrillation (HCC)    Vision disturbance 03/13/2013   Negative neuro MRA MRI   Past Surgical History:  Procedure Laterality Date   ABDOMINAL HYSTERECTOMY     pt denies 04/15/14   APPENDECTOMY     ATRIAL FIBRILLATION ABLATION N/A 02/01/2021   Procedure: ATRIAL FIBRILLATION ABLATION;  Surgeon: Thompson Grayer, MD;  Location: [facility];  Service: Cardiovascular;  Laterality: N/A;   LAPAROSCOPIC SALPINGO OOPHERECTOMY Right    TONSILLECTOMY AND ADENOIDECTOMY      Current  Facility-Administered Medications  Medication Dose Route Frequency Provider Last Rate Last Admin   0.9 %  sodium chloride infusion  250 mL Intravenous PRN [name], [name], PA 10 mL/hr at 05/31/21 1740 250 mL at 05/31/21 1740   acetaminophen (TYLENOL) tablet 1,000 mg  1,000 mg Oral Q6H PRN [name], [name], PA       apixaban (ELIQUIS) tablet 5 mg  5 mg Oral BID [name], [name], PA       [START ON 06/01/2021] calcium-vitamin D (OSCAL WITH D) 500-200 MG-UNIT per tablet 1 tablet  1 tablet Oral Daily [name], [name], PA       [START ON 06/01/2021] diltiazem (CARDIZEM CD) 24 hr capsule 300 mg  300 mg Oral Daily [name], [name], PA       diltiazem (CARDIZEM) tablet 30 mg  30 mg Oral Q4H PRN [name], [name], PA       dofetilide (TIKOSYN) capsule 250 mcg  250 mcg Oral BID [name], [name], PA       magnesium sulfate IVPB 2 g 50 mL  2 g Intravenous Once Kris Mouton, [name] 50 mL/hr at 05/31/21 1742 2 g at 05/31/21 1742   metoprolol tartrate (LOPRESSOR) tablet 25 mg  25 mg Oral BID [name], [name], PA       multivitamin (PROSIGHT) tablet 1 tablet  1 tablet Oral BID [name], [name], PA       sodium chloride  flush (NS) 0.9 % injection 3 mL  3 mL Intravenous Q12H [name], [name], PA   3 mL at 05/31/21 1736   sodium chloride flush (NS) 0.9 % injection 3 mL  3 mL Intravenous PRN [name], [name], PA        Allergies  Allergen Reactions   Latex Hives   Codeine Phosphate     drowsy   Demerol [Meperidine] Nausea And Vomiting   Other     Adhesive pads left on skin for extended periods   Protonix [Pantoprazole] Rash   Sulfa Antibiotics Rash   Tens Therapy Replace Back Pads Itching and Rash    Social History   Socioeconomic History   Marital status: Married    Spouse name: Not on file   Number of children: 0   Years of education: Not on file   Highest education level: Not on file  Occupational History   Occupation: Veterinary surgeon   Occupation: REPORTER    Employer: [employer]  Tobacco Use    Smoking status: Never   Smokeless tobacco: Never  Substance and Sexual Activity   Alcohol use: No    Comment: never   Drug use: No   Sexual activity: Not on file  Other Topics Concern   Not on file  Social History Narrative   Married   hh of 2    p0g0   Works for [employer]    Social Determinants of Radio broadcast assistant Strain: Low Risk    Difficulty of Paying Living Expenses: Not hard at all  Food Insecurity: Not on file  Transportation Needs: No Transportation Needs   Lack of Transportation (Medical): No   Lack of Transportation (Non-Medical): No  Physical Activity: Not on file  Stress: Not on file  Social Connections: Not on file  Intimate Partner Violence: Not on file    Family History  Problem Relation Age of Onset   Scleroderma Mother    Hyperlipidemia Mother    Coronary artery disease Father 62   Esophageal cancer Paternal Grandmother     ROS- All systems are reviewed and negative except as per the HPI above  Physical Exam: Vitals:   05/31/21 1500  BP: 140/65  Pulse: 61  Resp: 18  Temp: 97.9 F (36.6 C)  TempSrc: Oral  Weight: 49.4 kg  Height: '5\' 4"'$  (1.626 m)    Wt Readings from Last 3 Encounters:  05/31/21 49.4 kg  05/31/21 49.4 kg  05/18/21 48.5 kg    Labs: Lab Results  Component Value Date   NA 138 05/31/2021   K 3.5 05/31/2021   CL 102 05/31/2021   CO2 26 05/31/2021   GLUCOSE 106 (H) 05/31/2021   BUN 22 05/31/2021   CREATININE 0.76 05/31/2021   CALCIUM 9.5 05/31/2021   MG 2.0 05/31/2021   No results found for: INR Lab Results  Component Value Date   CHOL 226 (H) 05/17/2018   HDL 79.30 05/17/2018   LDLCALC 129 (H) 05/17/2018   TRIG 84.0 05/17/2018    GEN- The patient is a well appearing female, alert and oriented x 3 today.   HEENT-head normocephalic, atraumatic, sclera clear, conjunctiva pink, hearing intact, trachea midline. Lungs- Clear to ausculation bilaterally, normal work of breathing Heart- Regular rate  and rhythm, no rubs or gallops, 2/6 diastolic murmur   GI- soft, NT, ND, + BS Extremities- no clubbing, cyanosis, or edema MS- no significant deformity or atrophy Skin- no rash or lesion Psych- euthymic mood,  full affect Neuro- strength and sensation are intact   EKG- SR, NST Vent. rate 64 BPM PR interval 154 ms QRS duration 92 ms QT/QTcB 444/458 ms   Assessment and Plan: 1. Paroxysmal afib S/p ablation 02/01/21, multiple atrial flutter circuits noted. Patient presents for dofetilide admission.  Patient aware of price of dofetilide. Continue Eliquis 5 mg BID, states no missed doses in the last 3 weeks. No recent benadryl use PharmD has screened medications, flecainide discontinued.  QTc in SR 458 ms Labs today show creatinine at 0.76, K+ 3.5 and mag 2.0, CrCl calculated at 65 mL/min Continue metoprolol 25 mg bid  Continue diltiazem 300 mg daily with 30 mg PRN q 4 hours for heart racing.  2. CHA2DS2VASc score of at least 3 Continue  eliquis 5 mg bid    3. AI If AV repair is indicated, could consider MAZE at that time.  4. HTN Stable, no changes today.   To be admitted later today once a bed becomes available.    [facility] [address] [place], [place] [address] [phone]   I have seen, examined the patient, and reviewed the above assessment and plan.  Changes to above are made where necessary.  On exam, RRR.  She presents for further management of afib.  We will initiate tikosyn at this time.  Replete K.  Co Sign: Thompson Grayer, MD 05/31/2021 5:48 PM

## 2021-06-01 ENCOUNTER — Encounter (HOSPITAL_COMMUNITY): Payer: Self-pay | Admitting: Internal Medicine

## 2021-06-01 DIAGNOSIS — I10 Essential (primary) hypertension: Secondary | ICD-10-CM

## 2021-06-01 LAB — BASIC METABOLIC PANEL
Anion gap: 8 (ref 5–15)
BUN: 15 mg/dL (ref 8–23)
CO2: 25 mmol/L (ref 22–32)
Calcium: 9 mg/dL (ref 8.9–10.3)
Chloride: 106 mmol/L (ref 98–111)
Creatinine, Ser: 0.66 mg/dL (ref 0.44–1.00)
GFR, Estimated: >60 mL/min (ref >60)
Glucose, Bld: 101 mg/dL — ABNORMAL HIGH (ref 70–99)
Potassium: 4.3 mmol/L (ref 3.5–5.1)
Sodium: 139 mmol/L (ref 135–145)

## 2021-06-01 LAB — MAGNESIUM: Magnesium: 2.5 mg/dL — ABNORMAL HIGH (ref 1.7–2.4)

## 2021-06-01 MED ORDER — DOFETILIDE 250 MCG PO CAPS
250.0000 ug | ORAL_CAPSULE | Freq: Two times a day (BID) | ORAL | Status: DC
  Administered 2021-06-01: 250 ug via ORAL
  Filled 2021-06-01: qty 1

## 2021-06-01 MED ORDER — DOFETILIDE 250 MCG PO CAPS
250.0000 ug | ORAL_CAPSULE | Freq: Two times a day (BID) | ORAL | Status: DC
  Administered 2021-06-01 – 2021-06-03 (×4): 250 ug via ORAL
  Filled 2021-06-01 (×4): qty 1

## 2021-06-01 NOTE — Progress Notes (Addendum)
Progress Note  Patient Name: [name] [name] Date of Encounter: 06/01/2021  [facility] Cardiologist: Minus Breeding, MD   Subjective   Doing well, tolerating drug  Inpatient Medications    Scheduled Meds:  apixaban  5 mg Oral BID   calcium-vitamin D  1 tablet Oral Daily   diltiazem  300 mg Oral Daily   dofetilide  250 mcg Oral BID   metoprolol tartrate  25 mg Oral BID   multivitamin  1 tablet Oral BID   sodium chloride flush  3 mL Intravenous Q12H   Continuous Infusions:  sodium chloride Stopped (05/31/21 1900)   PRN Meds: sodium chloride, acetaminophen, diltiazem, sodium chloride flush   Vital Signs    Vitals:   05/31/21 2117 06/01/21 0051 06/01/21 0457 06/01/21 0812  BP: 117/74 127/64 137/66 134/64  Pulse: (!) 53 (!) 55 (!) 58 68  Resp: '18 15 17 18  '$ Temp: 97.9 F (36.6 C) 97.7 F (36.5 C) 97.8 F (36.6 C) 97.8 F (36.6 C)  TempSrc: Oral Oral Oral Oral  SpO2: 100% 100% 100%   Weight:      Height:        Intake/Output Summary (Last 24 hours) at 06/01/2021 0952 Last data filed at 06/01/2021 0300 Gross per 24 hour  Intake 2253.76 ml  Output --  Net 2253.76 ml   Last 3 Weights 05/31/2021 05/31/2021 05/18/2021  Weight (lbs) 108 lb 12.8 oz 109 lb 107 lb  Weight (kg) 49.351 kg 49.442 kg 48.535 kg      Telemetry    SB nocturnal 50's, SR 60's - Personally Reviewed  ECG    SB 52bpm, manually measured QT 459m, Qtc 4465m- Personally Reviewed  Physical Exam   GEN: No acute distress.   Neck: No JVD Cardiac: RRR, no murmurs, rubs, or gallops.  Respiratory: CTA b/l. GI: Soft, nontender, non-distended  MS: No edema; No deformity. Neuro:  Nonfocal  Psych: Normal affect   Labs    High Sensitivity Troponin:  No results for input(s): TROPONINIHS in the last 720 hours.    Chemistry Recent Labs  Lab 05/31/21 1123 05/31/21 1745 05/31/21 2150 06/01/21 0135  NA 138  --  138 139  K 3.5 3.5 3.8 4.3  CL 102  --  103 106  CO2 26  --  29 25  GLUCOSE 106*  --   99 101*  BUN 22  --  18 15  CREATININE 0.76  --  0.78 0.66  CALCIUM 9.5  --  9.5 9.0  GFRNONAA >60  --  >60 >60  ANIONGAP 10  --  6 8     HematologyNo results for input(s): WBC, RBC, HGB, HCT, MCV, MCH, MCHC, RDW, PLT in the last 168 hours.  BNPNo results for input(s): BNP, PROBNP in the last 168 hours.   DDimer No results for input(s): DDIMER in the last 168 hours.   Radiology    No results found.  Cardiac Studies    04/28/21: TTE IMPRESSIONS   1. Left ventricular ejection fraction, by estimation, is 50 to 55%. The  left ventricle has low normal function. The left ventricle has no regional  wall motion abnormalities. Left ventricular diastolic parameters are  indeterminate.   2. Right ventricular systolic function is normal. The right ventricular  size is normal. There is normal pulmonary artery systolic pressure. The  estimated right ventricular systolic pressure is 23123456mHg.   3. The mitral valve is normal in structure. Trivial mitral valve  regurgitation. No  evidence of mitral stenosis.   4. The aortic valve is grossly normal. There is mild calcification of the  aortic valve. Aortic valve regurgitation is moderate to severe. No aortic  stenosis is present.   5. The inferior vena cava is normal in size with greater than 50%  respiratory variability, suggesting right atrial pressure of 3 mmHg.   Conclusion(s)/Recommendation(s): Jet of aortic valve regurgitation is  eccentric and broad based, and may be underestimated by visual assessment.  Consider cardiac MRI for quantitation of aortic valve regurgitation if  clinically indicated.    02/01/21: EPS/ablation CONCLUSIONS: 1. Sinus rhythm upon presentation.   2. Intracardiac echo reveals a moderate sized left atrium. 3. Successful electrical isolation and anatomical encircling of all four pulmonary veins with radiofrequency current. 4. Additional mapping and ablation within the left atrium due to persistence of atrial  fibrillation with a posterior wall box demonstrated 5. Cavo-tricuspid isthmus ablation performed 6. Multiple atypical atrial flutter circuits observed 7. Cardioversion performed 8. No early apparent complications.   Patient Profile     70 y.o. female w/PMHx of HTN, VHD w/AI and Afib, admitted for Tikosyn initiation  Assessment & Plan    Paroxysmal Afib CHA2DS2Vasc is 4, on Eliquis, appropriately dosed Tikosyn load is in progress K+ 4.3 Mag 2.5 Creat 0.66 (stable) QTc stable  2. HTN Home meds  For questions or updates, please contact [facility] Please consult [email or web address] for contact info under        Signed, Baldwin Jamaica, PA-C  06/01/2021, 9:52 AM    I have seen, examined the patient, and reviewed the above assessment and plan.  Changes to above are made where necessary.  On exam, RRR.  Qt is stable.  Continue tikosyn.  Co Sign: Thompson Grayer, MD

## 2021-06-01 NOTE — Progress Notes (Signed)
Pharmacy: Dofetilide (Tikosyn) - Follow Up Assessment and Electrolyte Replacement  Pharmacy consulted to assist in monitoring and replacing electrolytes in this 70 y.o. female admitted on 05/31/2021 undergoing dofetilide initiation.  Labs:    Component Value Date/Time   K 4.3 06/01/2021 0135   MG 2.5 (H) 06/01/2021 0135     Plan: Potassium: K >/= 4: No additional supplementation needed  Magnesium: Mg > 2: No additional supplementation needed   Thank you for allowing pharmacy to participate in this patient's care   Hildred Laser, PharmD Clinical Pharmacist **Pharmacist phone directory can now be found on [email or web address] ([email or web address]).  Listed under [place].

## 2021-06-02 DIAGNOSIS — I061 Rheumatic aortic insufficiency: Secondary | ICD-10-CM

## 2021-06-02 LAB — BASIC METABOLIC PANEL
Anion gap: 7 (ref 5–15)
BUN: 24 mg/dL — ABNORMAL HIGH (ref 8–23)
CO2: 28 mmol/L (ref 22–32)
Calcium: 9.6 mg/dL (ref 8.9–10.3)
Chloride: 104 mmol/L (ref 98–111)
Creatinine, Ser: 0.83 mg/dL (ref 0.44–1.00)
GFR, Estimated: >60 mL/min (ref >60)
Glucose, Bld: 100 mg/dL — ABNORMAL HIGH (ref 70–99)
Potassium: 4.3 mmol/L (ref 3.5–5.1)
Sodium: 139 mmol/L (ref 135–145)

## 2021-06-02 LAB — MAGNESIUM: Magnesium: 2.1 mg/dL (ref 1.7–2.4)

## 2021-06-02 NOTE — Progress Notes (Addendum)
Progress Note  Patient Name: [name] [name] Date of Encounter: 06/02/2021  [facility] Cardiologist: Minus Breeding, MD   Subjective   Doing well, tolerating drug  Inpatient Medications    Scheduled Meds:  apixaban  5 mg Oral BID   calcium-vitamin D  1 tablet Oral Daily   diltiazem  300 mg Oral Daily   dofetilide  250 mcg Oral BID   metoprolol tartrate  25 mg Oral BID   multivitamin  1 tablet Oral BID   sodium chloride flush  3 mL Intravenous Q12H   Continuous Infusions:  sodium chloride Stopped (05/31/21 1900)   PRN Meds: sodium chloride, acetaminophen, diltiazem, sodium chloride flush   Vital Signs    Vitals:   06/01/21 1247 06/01/21 2056 06/02/21 0614 06/02/21 0815  BP: 120/66 136/66 (!) 117/55 125/66  Pulse: (!) 58 (!) 56 (!) 56 63  Resp: '16 17 16   '$ Temp: 98 F (36.7 C) 97.9 F (36.6 C) 98.3 F (36.8 C)   TempSrc: Oral Oral Oral   SpO2:  97% 93%   Weight:      Height:        Intake/Output Summary (Last 24 hours) at 06/02/2021 0847 Last data filed at 06/01/2021 2000 Gross per 24 hour  Intake 240 ml  Output --  Net 240 ml   Last 3 Weights 05/31/2021 05/31/2021 05/18/2021  Weight (lbs) 108 lb 12.8 oz 109 lb 107 lb  Weight (kg) 49.351 kg 49.442 kg 48.535 kg      Telemetry    SB nocturnal 50's, SR 60's - Personally Reviewed  ECG    SB 51bpm, manually measured QT 514/QTc 413m - Personally Reviewed  Physical Exam   Exam is unchanged GEN: No acute distress.   Neck: No JVD Cardiac: RRR, no murmurs, rubs, or gallops.  Respiratory: CTA b/l. GI: Soft, nontender, non-distended  MS: No edema; No deformity. Neuro:  Nonfocal  Psych: Normal affect   Labs    High Sensitivity Troponin:  No results for input(s): TROPONINIHS in the last 720 hours.    Chemistry Recent Labs  Lab 05/31/21 2150 06/01/21 0135 06/02/21 0500  NA 138 139 139  K 3.8 4.3 4.3  CL 103 106 104  CO2 '29 25 28  '$ GLUCOSE 99 [id number] 100*  BUN 18 15 24*  CREATININE 0.78 0.66 0.83   CALCIUM 9.5 9.0 9.6  GFRNONAA >60 >60 >60  ANIONGAP '6 8 7     '$ HematologyNo results for input(s): WBC, RBC, HGB, HCT, MCV, MCH, MCHC, RDW, PLT in the last 168 hours.  BNPNo results for input(s): BNP, PROBNP in the last 168 hours.   DDimer No results for input(s): DDIMER in the last 168 hours.   Radiology    No results found.  Cardiac Studies    04/28/21: TTE IMPRESSIONS   1. Left ventricular ejection fraction, by estimation, is 50 to 55%. The  left ventricle has low normal function. The left ventricle has no regional  wall motion abnormalities. Left ventricular diastolic parameters are  indeterminate.   2. Right ventricular systolic function is normal. The right ventricular  size is normal. There is normal pulmonary artery systolic pressure. The  estimated right ventricular systolic pressure is 2123456mmHg.   3. The mitral valve is normal in structure. Trivial mitral valve  regurgitation. No evidence of mitral stenosis.   4. The aortic valve is grossly normal. There is mild calcification of the  aortic valve. Aortic valve regurgitation is moderate to severe. No  aortic  stenosis is present.   5. The inferior vena cava is normal in size with greater than 50%  respiratory variability, suggesting right atrial pressure of 3 mmHg.   Conclusion(s)/Recommendation(s): Jet of aortic valve regurgitation is  eccentric and broad based, and may be underestimated by visual assessment.  Consider cardiac MRI for quantitation of aortic valve regurgitation if  clinically indicated.    02/01/21: EPS/ablation CONCLUSIONS: 1. Sinus rhythm upon presentation.   2. Intracardiac echo reveals a moderate sized left atrium. 3. Successful electrical isolation and anatomical encircling of all four pulmonary veins with radiofrequency current. 4. Additional mapping and ablation within the left atrium due to persistence of atrial fibrillation with a posterior wall box demonstrated 5. Cavo-tricuspid isthmus  ablation performed 6. Multiple atypical atrial flutter circuits observed 7. Cardioversion performed 8. No early apparent complications.   Patient Profile     70 y.o. female w/PMHx of HTN, VHD w/AI and Afib, admitted for Tikosyn initiation  Assessment & Plan    Paroxysmal Afib CHA2DS2Vasc is 4, on Eliquis, appropriately dosed Tikosyn load is in progress K+ 4.3 Mag 2.5 Creat 0.83 (calc cr cl remains acceptable for dose) EKG is reviewed with Dr. Rayann Heman, some lengthening but QTc  remains acceptable   Anticipate d/c tomorrow  2. HTN Home meds  For questions or updates, please contact [place] Please consult [email or web address] for contact info under        Signed, Baldwin Jamaica, PA-C  06/02/2021, 8:47 AM    I have seen, examined the patient, and reviewed the above assessment and plan.  Changes to above are made where necessary.  On exam, RRR.  Doing well with tikosyn. Echo from 7/22 discussed with the patient.  She has moderate to severe AI.  MRI was advised.  I will reach out to Dr Percival Spanish to see if he would like for [place] to obtain while here.  Co Sign: Thompson Grayer, MD 06/02/2021 10:35 AM

## 2021-06-02 NOTE — Care Management (Signed)
06-02-21 1318 Case Manager spoke with patient regarding Tikosyn. Patient wants to get the first Tikosyn fill via [facility]. 2nd fill to [facility] [place] for 30 day supply. [facility] allows one fill at a local pharmacy and then Rx's need to be sent via mail order. Patient wants to delay that 90 day supply Rx if possible because she will get it in one week. Patient wants to make sure this will be the correct dosage before she gets an influx of medications. Case Manager will continue to follow for additional needs.

## 2021-06-02 NOTE — Discharge Summary (Addendum)
ELECTROPHYSIOLOGY PROCEDURE DISCHARGE SUMMARY    Patient ID: [name] [name],  MRN: [id number], DOB/AGE: 01/31/1951 70 y.o.  Admit date: 05/31/2021 Discharge date: 06/03/21  Primary Care Physician: Burnis Medin, MD Primary Cardiologist: Dr. Percival Spanish Electrophysiologist: Dr. Rayann Heman  Primary Discharge Diagnosis:  1.  paroxysmal atrial fibrillation status post Tikosyn loading this admission      CHA2DS2Vasc is 4, on Eliquis  Secondary Discharge Diagnosis:  HTN VHD w/AI  Allergies  Allergen Reactions   Latex Hives   Other Rash and Other (See Comments)    Adhesive pads left on skin for extended periods = BURNS AND RASHES ON THE SKIN!!!!   Codeine Phosphate Other (See Comments)    Drowsiness    Demerol [Meperidine] Nausea And Vomiting   Protonix [Pantoprazole] Rash   Sulfa Antibiotics Rash   Tens Therapy Replace Back Pads Itching and Rash     Procedures This Admission:  1.  Tikosyn loading    Brief HPI: [name] [name] is a 70 y.o. female with a past medical history as noted above.  She is followed by EP in the outpatient setting for her atrial fibrillation.  Risks, benefits, and alternatives to Tikosyn were reviewed with the patient who wished to proceed.    Hospital Course:  The patient was admitted and Tikosyn was initiated.  Renal function and electrolytes were followed during the hospitalization.  The patient's QTc remained stable.  She arrived in SR and maintained SR.  She was monitored until discharge on telemetry which demonstrated SB/SR.  On the day of discharge, she feels well, was examined by Dr Rayann Heman who considered the patient stable for discharge to home.  Follow-up has been arranged with the AFib clinic in 1 week and with EP APP in 4 weeks.   The patient with mod-sever AI on her last echo, Dr. Rayann Heman reached out to Dr. Percival Spanish to inquire about MRI while in patient to evaluate this further, he plans to follow this up out patient.  Tikosyn teaching was  completed She required potassium supplementation initially on admission and a second small dose replacement here, will add small K+ supplement, does not appear to tend to be hypokalemic  [facility]st fill [facility]  She asked notto sen to mail away yet until her f/u visits   Physical Exam: Vitals:   06/02/21 1958 06/02/21 2013 06/03/21 0500 06/03/21 0811  BP:   137/61 133/61  Pulse: 69 66 (!) 56   Resp:   16   Temp:   97.9 F (36.6 C)   TempSrc:   Oral   SpO2:  96% 98%   Weight:      Height:         GEN- The patient is well appearing, alert and oriented x 3 today.   HEENT: normocephalic, atraumatic; sclera clear, conjunctiva pink; hearing intact; oropharynx clear; neck supple, no JVP Lymph- no cervical lymphadenopathy Lungs- CTA b/l, normal work of breathing.  No wheezes, rales, rhonchi Heart- RRR, no murmurs, rubs or gallops, PMI not laterally displaced GI- soft, non-tender, non-distended Extremities- no clubbing, cyanosis, or edema MS- no significant deformity or atrophy Skin- warm and dry, no rash or lesion Psych- euthymic mood, full affect Neuro- strength and sensation are intact   Labs:   Lab Results  Component Value Date   WBC 6.0 01/10/2021   HGB 13.7 01/10/2021   HCT 40.6 01/10/2021   MCV 98 (H) 01/10/2021   PLT 226  01/10/2021    Recent Labs  Lab 06/03/21 0227  NA 140  K 3.8  CL 105  CO2 29  BUN 21  CREATININE 0.66  CALCIUM 9.4  GLUCOSE 90     Discharge Medications:  Allergies as of 06/03/2021       Reactions   Latex Hives   Other Rash, Other (See Comments)   Adhesive pads left on skin for extended periods = BURNS AND RASHES ON THE SKIN!!!!   Codeine Phosphate Other (See Comments)   Drowsiness   Demerol [meperidine] Nausea And Vomiting   Protonix [pantoprazole] Rash   Sulfa Antibiotics Rash   Tens Therapy Replace Back Pads Itching, Rash        Medication List     TAKE these medications    acetaminophen  500 MG tablet Commonly known as: TYLENOL Take 1,000 mg by mouth every 6 (six) hours as needed for moderate pain.   alendronate 70 MG tablet Commonly known as: FOSAMAX TAKE 1 TABLET ONCE A WEEK  WITH A FULL GLASS OF WATER  ON AN EMPTY STOMACH   CALCIUM+D3 PO Take 1 tablet by mouth daily with supper.   diltiazem 30 MG tablet Commonly known as: Cardizem Take 1 tablet every 4 hours AS NEEDED for breakthrough AFIB   diltiazem 300 MG 24 hr capsule Commonly known as: TIAZAC Take 1 capsule (300 mg total) by mouth daily.   dofetilide 250 MCG capsule Commonly known as: TIKOSYN Take 1 capsule (250 mcg total) by mouth 2 (two) times daily.   Eliquis 5 MG Tabs tablet Generic drug: apixaban TAKE 1 TABLET BY MOUTH  TWICE DAILY   LORazepam 0.5 MG tablet Commonly known as: ATIVAN Take 0.5 mg by mouth daily as needed for anxiety (when flying).   metoprolol tartrate 25 MG tablet Commonly known as: LOPRESSOR Take 1 tablet (25 mg total) by mouth 2 (two) times daily.   potassium chloride 10 MEQ tablet Commonly known as: KLOR-CON Take 1 tablet (10 mEq total) by mouth daily.   PRESERVISION AREDS PO Take 1 capsule by mouth in the morning and at bedtime.       ASK your doctor about these medications    Calcium Carb-Cholecalciferol [phone] MG-UNIT Caps Take 1 capsule by mouth daily.        Disposition: home Discharge Instructions     Diet - low sodium heart healthy   Complete by: As directed    Increase activity slowly   Complete by: As directed        Follow-up Information     [facility] Follow up.   Specialty: Cardiology Why: 06/09/21 @ 2;30PM with R. [name], PA-C Contact information: [address] [id number] mc [place] [phone]        Shirley Friar, PA-C Follow up.   Specialty: Physician Assistant Why: 07/05/21 @ 9:40AM (for Dr. Rayann Heman) Contact information: [place]  [place] [address] [phone]                 Duration of Discharge Encounter: Greater than 30 minutes including physician time.  Signed, Tommye Standard, PA-C 06/03/2021 10:53 AM   I have seen, examined the patient, and reviewed the above assessment and plan.  Changes to above are made where necessary.  On exam, RRR.  Doing well with tikosyn.  DC to home with follow-up next week in the AF clinic.  Co Sign: Thompson Grayer, MD

## 2021-06-02 NOTE — Progress Notes (Signed)
Pharmacy: Dofetilide (Tikosyn) - Follow Up Assessment and Electrolyte Replacement  Pharmacy consulted to assist in monitoring and replacing electrolytes in this 70 y.o. female admitted on 05/31/2021 undergoing dofetilide initiation.   Labs:    Component Value Date/Time   K 4.3 06/02/2021 0500   MG 2.1 06/02/2021 0500     Plan: Potassium: K >/= 4: No additional supplementation needed  Magnesium: Mg > 2: No additional supplementation needed   Thank you for allowing pharmacy to participate in this patient's care   Hildred Laser, PharmD Clinical Pharmacist **Pharmacist phone directory can now be found on [email or web address] ([email or web address]).  Listed under [place].

## 2021-06-03 ENCOUNTER — Other Ambulatory Visit (HOSPITAL_COMMUNITY): Payer: Self-pay

## 2021-06-03 LAB — BASIC METABOLIC PANEL
Anion gap: 6 (ref 5–15)
BUN: 21 mg/dL (ref 8–23)
CO2: 29 mmol/L (ref 22–32)
Calcium: 9.4 mg/dL (ref 8.9–10.3)
Chloride: 105 mmol/L (ref 98–111)
Creatinine, Ser: 0.66 mg/dL (ref 0.44–1.00)
GFR, Estimated: >60 mL/min (ref >60)
Glucose, Bld: 90 mg/dL (ref 70–99)
Potassium: 3.8 mmol/L (ref 3.5–5.1)
Sodium: 140 mmol/L (ref 135–145)

## 2021-06-03 LAB — MAGNESIUM: Magnesium: 2 mg/dL (ref 1.7–2.4)

## 2021-06-03 MED ORDER — DOFETILIDE 250 MCG PO CAPS
250.0000 ug | ORAL_CAPSULE | Freq: Two times a day (BID) | ORAL | 0 refills | Status: DC
  Filled 2021-06-03: qty 60, 30d supply, fill #0

## 2021-06-03 MED ORDER — DOFETILIDE 250 MCG PO CAPS: 250.0000 ug | ORAL_CAPSULE | Freq: Two times a day (BID) | ORAL | 3 refills | Status: DC

## 2021-06-03 MED ORDER — POTASSIUM CHLORIDE CRYS ER 20 MEQ PO TBCR
40.0000 meq | EXTENDED_RELEASE_TABLET | Freq: Once | ORAL | Status: AC
  Administered 2021-06-03: 40 meq via ORAL
  Filled 2021-06-03: qty 2

## 2021-06-03 MED ORDER — MAGNESIUM SULFATE 2 GM/50ML IV SOLN: 2.0000 g | Freq: Once | INTRAVENOUS | Status: DC

## 2021-06-03 MED ORDER — POTASSIUM CHLORIDE CRYS ER 10 MEQ PO TBCR
10.0000 meq | EXTENDED_RELEASE_TABLET | Freq: Every day | ORAL | 0 refills | Status: DC
  Filled 2021-06-03: qty 30, 30d supply, fill #0

## 2021-06-03 MED ORDER — POTASSIUM CHLORIDE CRYS ER 10 MEQ PO TBCR: 10.0000 meq | EXTENDED_RELEASE_TABLET | Freq: Every day | ORAL | 3 refills | Status: DC

## 2021-06-03 NOTE — Plan of Care (Signed)

## 2021-06-03 NOTE — Progress Notes (Signed)
I reviewed d/c instructions with patient and husband. We reviewed Tikosyn education, afib and heart healthy diet . Papers given. Patient d/c'd in stable condition to private vehicle with husband

## 2021-06-03 NOTE — Care Management Important Message (Signed)
Important Message  Patient Details  Name: [name] [name] MRN: [id number] Date of Birth: 04-19-1951   Medicare Important Message Given:  Yes     Shelda Altes 06/03/2021, 9:35 AM

## 2021-06-03 NOTE — Progress Notes (Signed)
Pharmacy: Dofetilide (Tikosyn) - Follow Up Assessment and Electrolyte Replacement  Pharmacy consulted to assist in monitoring and replacing electrolytes in this 70 y.o. female admitted on 05/31/2021 undergoing dofetilide initiation.   Labs:    Component Value Date/Time   K 3.8 06/03/2021 0227   MG 2.0 06/03/2021 0227     Plan: Potassium: -K 33mq po given  Magnesium: Mg 1.8-2: Give Mg 2 gm IV x1    As patient has required a total of 1859m potassium since 8/2, recommend discharging patient with prescription for:  Potassium chloride 40 mEq  daily  Thank you for allowing pharmacy to participate in this patient's care   AnHildred LaserPharmD Clinical Pharmacist **Pharmacist phone directory can now be found on [email or web address] ([email or web address]).  Listed under [place]

## 2021-06-08 ENCOUNTER — Telehealth: Payer: Self-pay

## 2021-06-08 NOTE — Telephone Encounter (Signed)
Patient called needing advice as to whether she should stay in the home with her husband who just tested positive for Covid and is quarantining for the next 5 days.  I am also sending Nurse Advice Hotline phone # via [place]

## 2021-06-08 NOTE — Telephone Encounter (Signed)
Pt informed of the message below and verbalized understanding. 

## 2021-06-08 NOTE — Telephone Encounter (Signed)
Left a message for the pt to return my call.  

## 2021-06-08 NOTE — Telephone Encounter (Signed)
She can stay in the house but both of them should wear a mask at all times except when sleeping or eating. They should eat and sleep in separate rooms

## 2021-06-09 ENCOUNTER — Encounter (HOSPITAL_COMMUNITY): Payer: Self-pay | Admitting: Physician Assistant

## 2021-06-09 ENCOUNTER — Ambulatory Visit (HOSPITAL_COMMUNITY)
Admit: 2021-06-09 | Discharge: 2021-06-09 | Disposition: A | Discharge status: Home / Self Care | Payer: Medicare Other | Source: Ambulatory Visit | Attending: Physician Assistant | Admitting: Physician Assistant

## 2021-06-09 ENCOUNTER — Other Ambulatory Visit: Payer: Self-pay

## 2021-06-09 VITALS — BP 112/72 | HR 60 | Ht 64.0 in | Wt 105.8 lb

## 2021-06-09 DIAGNOSIS — I480 Paroxysmal atrial fibrillation: Secondary | ICD-10-CM | POA: Insufficient documentation

## 2021-06-09 DIAGNOSIS — Z885 Allergy status to narcotic agent status: Secondary | ICD-10-CM | POA: Diagnosis not present

## 2021-06-09 DIAGNOSIS — D6869 Other thrombophilia: Secondary | ICD-10-CM

## 2021-06-09 DIAGNOSIS — Z7901 Long term (current) use of anticoagulants: Secondary | ICD-10-CM | POA: Insufficient documentation

## 2021-06-09 DIAGNOSIS — Z91040 Latex allergy status: Secondary | ICD-10-CM | POA: Diagnosis not present

## 2021-06-09 DIAGNOSIS — Z882 Allergy status to sulfonamides status: Secondary | ICD-10-CM | POA: Insufficient documentation

## 2021-06-09 DIAGNOSIS — I10 Essential (primary) hypertension: Secondary | ICD-10-CM | POA: Diagnosis not present

## 2021-06-09 LAB — BASIC METABOLIC PANEL
Anion gap: 6 (ref 5–15)
BUN: 19 mg/dL (ref 8–23)
CO2: 29 mmol/L (ref 22–32)
Calcium: 9.4 mg/dL (ref 8.9–10.3)
Chloride: 104 mmol/L (ref 98–111)
Creatinine, Ser: 0.83 mg/dL (ref 0.44–1.00)
GFR, Estimated: >60 mL/min (ref >60)
Glucose, Bld: 135 mg/dL — ABNORMAL HIGH (ref 70–99)
Potassium: 3.7 mmol/L (ref 3.5–5.1)
Sodium: 139 mmol/L (ref 135–145)

## 2021-06-09 LAB — MAGNESIUM: Magnesium: 2.1 mg/dL (ref 1.7–2.4)

## 2021-06-09 MED ORDER — DOFETILIDE 250 MCG PO CAPS: 250.0000 ug | ORAL_CAPSULE | Freq: Two times a day (BID) | ORAL | 3 refills | Status: DC

## 2021-06-09 NOTE — Patient Instructions (Signed)
Low-Sodium Eating Plan Sodium, which is an element that makes up salt, helps you maintain a healthy balance of fluids in your body. Too much sodium can increase your blood pressure and cause fluid and waste to be held in your body. Your health care provider or dietitian may recommend following this plan if you have high blood pressure (hypertension), kidney disease, liver disease, or heart failure. Eating less sodium can help lower your blood pressure, reduce swelling, and protect your heart, liver, and kidneys. What are tips for following this plan? Reading food labels The Nutrition Facts label lists the amount of sodium in one serving of the food. If you eat more than one serving, you must multiply the listed amount of sodium by the number of servings. Choose foods with less than 140 mg of sodium per serving. Avoid foods with 300 mg of sodium or more per serving. Shopping  Look for lower-sodium products, often labeled as "low-sodium" or "no salt added." Always check the sodium content, even if foods are labeled as "unsalted" or "no salt added." Buy fresh foods. Avoid canned foods and pre-made or frozen meals. Avoid canned, cured, or processed meats. Buy breads that have less than 80 mg of sodium per slice. Cooking  Eat more home-cooked food and less restaurant, buffet, and fast food. Avoid adding salt when cooking. Use salt-free seasonings or herbs instead of table salt or sea salt. Check with your health care provider or pharmacist before using salt substitutes. Cook with plant-based oils, such as canola, sunflower, or olive oil. Meal planning When eating at a restaurant, ask that your food be prepared with less salt or no salt, if possible. Avoid dishes labeled as brined, pickled, cured, smoked, or made with soy sauce, miso, or teriyaki sauce. Avoid foods that contain MSG (monosodium glutamate). MSG is sometimes added to Chinese food, bouillon, and some canned foods. Make meals that can  be grilled, baked, poached, roasted, or steamed. These are generally made with less sodium. General information Most people on this plan should limit their sodium intake to 1,500-2,000 mg (milligrams) of sodium each day. What foods should I eat? Fruits Fresh, frozen, or canned fruit. Fruit juice. Vegetables Fresh or frozen vegetables. "No salt added" canned vegetables. "No salt added" tomato sauce and paste. Low-sodium or reduced-sodium tomato and vegetable juice. Grains Low-sodium cereals, including oats, puffed wheat and rice, and shredded wheat. Low-sodium crackers. Unsalted rice. Unsalted pasta. Low-sodium bread. Whole-grain breads and whole-grain pasta. Meats and other proteins Fresh or frozen (no salt added) meat, poultry, seafood, and fish. Low-sodium canned tuna and salmon. Unsalted nuts. Dried peas, beans, and lentils without added salt. Unsalted canned beans. Eggs. Unsalted nut butters. Dairy Milk. Soy milk. Cheese that is naturally low in sodium, such as ricotta cheese, fresh mozzarella, or Swiss cheese. Low-sodium or reduced-sodium cheese. Cream cheese. Yogurt. Seasonings and condiments Fresh and dried herbs and spices. Salt-free seasonings. Low-sodium mustard and ketchup. Sodium-free salad dressing. Sodium-free light mayonnaise. Fresh or refrigerated horseradish. Lemon juice. Vinegar. Other foods Homemade, reduced-sodium, or low-sodium soups. Unsalted popcorn and pretzels. Low-salt or salt-free chips. The items listed above may not be a complete list of foods and beverages you can eat. Contact a dietitian for more information. What foods should I avoid? Vegetables Sauerkraut, pickled vegetables, and relishes. Olives. French fries. Onion rings. Regular canned vegetables (not low-sodium or reduced-sodium). Regular canned tomato sauce and paste (not low-sodium or reduced-sodium). Regular tomato and vegetable juice (not low-sodium or reduced-sodium). Frozen vegetables in  sauces. Grains   Instant hot cereals. Bread stuffing, pancake, and biscuit mixes. Croutons. Seasoned rice or pasta mixes. Noodle soup cups. Boxed or frozen macaroni and cheese. Regular salted crackers. Self-rising flour. Meats and other proteins Meat or fish that is salted, canned, smoked, spiced, or pickled. Precooked or cured meat, such as sausages or meat loaves. Bacon. Ham. Pepperoni. Hot dogs. Corned beef. Chipped beef. Salt pork. Jerky. Pickled herring. Anchovies and sardines. Regular canned tuna. Salted nuts. Dairy Processed cheese and cheese spreads. Hard cheeses. Cheese curds. Blue cheese. Feta cheese. String cheese. Regular cottage cheese. Buttermilk. Canned milk. Fats and oils Salted butter. Regular margarine. Ghee. Bacon fat. Seasonings and condiments Onion salt, garlic salt, seasoned salt, table salt, and sea salt. Canned and packaged gravies. Worcestershire sauce. Tartar sauce. Barbecue sauce. Teriyaki sauce. Soy sauce, including reduced-sodium. Steak sauce. Fish sauce. Oyster sauce. Cocktail sauce. Horseradish that you find on the shelf. Regular ketchup and mustard. Meat flavorings and tenderizers. Bouillon cubes. Hot sauce. Pre-made or packaged marinades. Pre-made or packaged taco seasonings. Relishes. Regular salad dressings. Salsa. Other foods Salted popcorn and pretzels. Corn chips and puffs. Potato and tortilla chips. Canned or dried soups. Pizza. Frozen entrees and pot pies. The items listed above may not be a complete list of foods and beverages you should avoid. Contact a dietitian for more information. Summary Eating less sodium can help lower your blood pressure, reduce swelling, and protect your heart, liver, and kidneys. Most people on this plan should limit their sodium intake to 1,500-2,000 mg (milligrams) of sodium each day. Canned, boxed, and frozen foods are high in sodium. Restaurant foods, fast foods, and pizza are also very high in sodium. You also get sodium by  adding salt to food. Try to cook at home, eat more fresh fruits and vegetables, and eat less fast food and canned, processed, or prepared foods. This information is not intended to replace advice given to you by your health care provider. Make sure you discuss any questions you have with your health care provider. Document Revised: 11/21/2019 Document Reviewed: 09/17/2019 Elsevier Patient Education  2022 [employer]  

## 2021-06-09 NOTE — Progress Notes (Signed)
Primary Care Physician: Burnis Medin, MD Referring Physician: Dr. Percival Spanish EP: Dr. 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 female with a h/o HTN, paroxysmal afib, initially diagnosed with afib for several years. She is on rate control/flecainde  meds as well as eliquis 5 mg for a chadsvasc score  of 3. She is in the afib clinic to f/u ablation one month ago. She did have some episodes the second week of ablation but sine 18th of April she has not had anymore episodes. She did have an allergic reaction on her torso, probaly form the pads that were placed on chest for cardioversion. It has resolved. No swallowing or groin issues. She is back to her usual activities.    Follow up in the AF clinic 05/31/21. Patient presents for dofetilide admission. She reports that she has had afib about every other day. She has stopped flecainide 3 days ago. She denies any missed doses of anticoagulation.   Follow up in the AF clinic 05/31/21. Patient is s/p dofetilide admission 8/2-06/03/21. Patient reports she has not had any afib since starting the medication which is an improvement for her. She denies any bleeding issues on anticoagulation.   Today, she denies symptoms of palpitations, chest pain, shortness of breath, orthopnea, PND, lower extremity edema, dizziness, presyncope, syncope, or neurologic sequela. The patient is tolerating medications without difficulties and is otherwise without complaint today.   Past Medical History:  Diagnosis Date   Allergic rhinitis    Aortic insufficiency    CAT SCRATCH 09/10/2009   Qualifier: Diagnosis of  By: Regis Bill MD, [name]    Clavicle fracture 3/08   left   Diverticulosis    HTN (hypertension)    Osteopenia    Paroxysmal atrial fibrillation (HCC)    Vision disturbance 03/13/2013   Negative neuro MRA MRI   Past Surgical History:  Procedure Laterality Date   ABDOMINAL HYSTERECTOMY     pt denies 04/15/14   APPENDECTOMY     ATRIAL FIBRILLATION ABLATION N/A 02/01/2021    Procedure: ATRIAL FIBRILLATION ABLATION;  Surgeon: Thompson Grayer, MD;  Location: [facility];  Service: Cardiovascular;  Laterality: N/A;   LAPAROSCOPIC SALPINGO OOPHERECTOMY Right    TONSILLECTOMY AND ADENOIDECTOMY      Current Outpatient Medications  Medication Sig Dispense Refill   acetaminophen (TYLENOL) 500 MG tablet Take 1,000 mg by mouth every 6 (six) hours as needed for moderate pain.     alendronate (FOSAMAX) 70 MG tablet TAKE 1 TABLET ONCE A WEEK  WITH A FULL GLASS OF WATER  ON AN EMPTY STOMACH 12 tablet 3   apixaban (ELIQUIS) 5 MG TABS tablet TAKE 1 TABLET BY MOUTH  TWICE DAILY 180 tablet 1   Calcium Carb-Cholecalciferol (CALCIUM+D3 PO) Take 1 tablet by mouth daily with supper.     diltiazem (CARDIZEM) 30 MG tablet Take 1 tablet every 4 hours AS NEEDED for breakthrough AFIB 45 tablet 1   diltiazem (TIAZAC) 300 MG 24 hr capsule Take 1 capsule (300 mg total) by mouth daily. 90 capsule 3   LORazepam (ATIVAN) 0.5 MG tablet Take 0.5 mg by mouth daily as needed for anxiety (when flying).     metoprolol tartrate (LOPRESSOR) 25 MG tablet Take 1 tablet (25 mg total) by mouth 2 (two) times daily. 180 tablet 3   Multiple Vitamins-Minerals (PRESERVISION AREDS PO) Take 1 capsule by mouth in the morning and at bedtime.     potassium chloride (KLOR-CON) 10 MEQ tablet Take  1 tablet (10 mEq total) by mouth daily. 30 tablet 0   dofetilide (TIKOSYN) 250 MCG capsule Take 1 capsule (250 mcg total) by mouth 2 (two) times daily. 60 capsule 3   No current facility-administered medications for this encounter.    Allergies  Allergen Reactions   Latex Hives   Other Rash and Other (See Comments)    Adhesive pads left on skin for extended periods = BURNS AND RASHES ON THE SKIN!!!!   Codeine Phosphate Other (See Comments)    Drowsiness    Demerol [Meperidine] Nausea And Vomiting   Protonix [Pantoprazole] Rash   Sulfa Antibiotics Rash   Tens Therapy Replace Back Pads Itching and Rash     Social History   Socioeconomic History   Marital status: Married    Spouse name: Not on file   Number of children: 0   Years of education: Not on file   Highest education level: Not on file  Occupational History   Occupation: Veterinary surgeon   Occupation: REPORTER    Employer: [employer]  Tobacco Use   Smoking status: Never   Smokeless tobacco: Never  Substance and Sexual Activity   Alcohol use: No    Comment: never   Drug use: No   Sexual activity: Not on file  Other Topics Concern   Not on file  Social History Narrative   Married   hh of 2    p0g0   Works for [employer]    Social Determinants of Radio broadcast assistant Strain: Low Risk    Difficulty of Paying Living Expenses: Not hard at all  Food Insecurity: Not on file  Transportation Needs: No Transportation Needs   Lack of Transportation (Medical): No   Lack of Transportation (Non-Medical): No  Physical Activity: Not on file  Stress: Not on file  Social Connections: Not on file  Intimate Partner Violence: Not on file    Family History  Problem Relation Age of Onset   Scleroderma Mother    Hyperlipidemia Mother    Coronary artery disease Father 13   Esophageal cancer Paternal Grandmother     ROS- All systems are reviewed and negative except as per the HPI above  Physical Exam: Vitals:   06/09/21 1427  BP: 112/72  Pulse: 60  Weight: 48 kg  Height: '5\' 4"'$  (1.626 m)     Wt Readings from Last 3 Encounters:  06/09/21 48 kg  05/31/21 49.4 kg  05/31/21 49.4 kg    Labs: Lab Results  Component Value Date   NA 139 06/09/2021   K 3.7 06/09/2021   CL 104 06/09/2021   CO2 29 06/09/2021   GLUCOSE 135 (H) 06/09/2021   BUN 19 06/09/2021   CREATININE 0.83 06/09/2021   CALCIUM 9.4 06/09/2021   MG 2.1 06/09/2021   No results found for: INR Lab Results  Component Value Date   CHOL 226 (H) 05/17/2018   HDL 79.30 05/17/2018   LDLCALC 129 (H) 05/17/2018   TRIG 84.0 05/17/2018     GEN- The patient is a well appearing female, alert and oriented x 3 today.   HEENT-head normocephalic, atraumatic, sclera clear, conjunctiva pink, hearing intact, trachea midline. Lungs- Clear to ausculation bilaterally, normal work of breathing Heart- Regular rate and rhythm, no rubs or gallops, 2/6 diastolic murmur  GI- soft, NT, ND, + BS Extremities- no clubbing, cyanosis, or edema MS- no significant deformity or atrophy Skin- no rash or lesion Psych- euthymic mood, full affect Neuro-  strength and sensation are intact   EKG-  SR Vent. rate 60 BPM PR interval 156 ms QRS duration 98 ms QT/QTcB 474/474 ms   Assessment and Plan: 1. Paroxysmal afib S/p ablation 02/01/21, multiple atrial flutter circuits noted. Patient is s/p dofetilide admission 8/2-06/03/21. Continue dofetilide 250 mcg BID. QT stable. Check bmet/mag today. Continue Eliquis 5 mg BID Continue metoprolol 25 mg BID Continue diltiazem 300 mg daily with 30 mg PRN q 4 hours for heart racing.  2. CHA2DS2VASc score of at least 3 Continue  eliquis 5 mg bid    3. AI If AV repair is indicated, could consider MAZE at that time.  4. HTN Stable, no changes today.   Follow up with Oda Kilts and Dr Percival Spanish as scheduled.    [facility] [address] [place], [place] [address] [phone]

## 2021-06-10 NOTE — Telephone Encounter (Signed)
Even though her initial Covid test was negative, there is a high likelihood that she does have it. I suggest she go to an urgent care this evening to get retested, and they could also treat it

## 2021-06-10 NOTE — Telephone Encounter (Signed)
Pt informed of the message below and verbalized understanding. 

## 2021-06-10 NOTE — Telephone Encounter (Signed)
PT called to advise that she would like to get further advise on what to do as she still has her sore throat. She now has throat congestion and wants to get further advise on what to do and find out if she needs to do another Covid Test.

## 2021-06-11 DIAGNOSIS — Z20822 Contact with and (suspected) exposure to covid-19: Secondary | ICD-10-CM | POA: Diagnosis not present

## 2021-06-12 ENCOUNTER — Telehealth: Payer: Self-pay | Admitting: Student

## 2021-06-12 NOTE — Telephone Encounter (Signed)
    The patient called the after-hours line reporting that she had mild cold-like symptoms starting on Thursday and has felt more fatigued over the past several days. Her husband tested positive for COVID-19 last week and she went for testing and was negative but took a home test this morning and was positive.  She was previously informed she would not be a candidate for Paxlovid by the [facility] given that she is on Tikosyn. We reviewed recommendations in regards to quarantining and reviewed over-the-counter medication options which are safe to take with Tikosyn. I encouraged her to utilize the virtual resources available through the [facility] website if she develops any worsening symptoms.  She voiced understanding of this information was appreciative of the return call.  Signed, Erma Heritage, PA-C 06/12/2021, 1:35 PM

## 2021-06-13 ENCOUNTER — Telehealth: Payer: Medicare Other | Admitting: Nurse Practitioner

## 2021-06-13 ENCOUNTER — Other Ambulatory Visit: Payer: Self-pay | Admitting: Internal Medicine

## 2021-06-13 ENCOUNTER — Telehealth: Payer: Self-pay

## 2021-06-13 ENCOUNTER — Encounter (HOSPITAL_BASED_OUTPATIENT_CLINIC_OR_DEPARTMENT_OTHER): Payer: Self-pay

## 2021-06-13 ENCOUNTER — Encounter: Payer: Self-pay | Admitting: Nurse Practitioner

## 2021-06-13 DIAGNOSIS — J690 Pneumonitis due to inhalation of food and vomit: Secondary | ICD-10-CM

## 2021-06-13 DIAGNOSIS — U071 COVID-19: Secondary | ICD-10-CM | POA: Diagnosis not present

## 2021-06-13 MED ORDER — MOLNUPIRAVIR EUA 200MG CAPSULE: 4.0000 | ORAL_CAPSULE | Freq: Two times a day (BID) | ORAL | 0 refills | Status: AC

## 2021-06-13 NOTE — Progress Notes (Signed)
Virtual Visit Consent   [name] [name], you are scheduled for a virtual visit with a [facility] provider today.     Just as with appointments in the office, your consent must be obtained to participate.  Your consent will be active for this visit and any virtual visit you may have with one of our providers in the next 365 days.     If you have a MyChart account, a copy of this consent can be sent to you electronically.  All virtual visits are billed to your insurance company just like a traditional visit in the office.    As this is a virtual visit, video technology does not allow for your provider to perform a traditional examination.  This may limit your provider's ability to fully assess your condition.  If your provider identifies any concerns that need to be evaluated in person or the need to arrange testing (such as labs, EKG, etc.), we will make arrangements to do so.     Although advances in technology are sophisticated, we cannot ensure that it will always work on either your end or our end.  If the connection with a video visit is poor, the visit may have to be switched to a telephone visit.  With either a video or telephone visit, we are not always able to ensure that we have a secure connection.     I need to obtain your verbal consent now.   Are you willing to proceed with your visit today?    [name] has provided verbal consent on 06/13/2021 for a virtual visit (video or telephone).   [name], FNP   Date: 06/13/2021 4:28 PM   Virtual Visit via Video Note   I, [name], connected with  [name] [name]  ([id number], 02/10/1951) on 06/13/21 at  4:30 PM EDT by a video-enabled telemedicine application and verified that I am speaking with the correct person using two identifiers.  Location: Patient: Virtual Visit Location Patient: Home Provider: Virtual Visit Location Provider: Office/Clinic   I discussed the limitations of evaluation and management by telemedicine and the  availability of in person appointments. The patient expressed understanding and agreed to proceed.    History of Present Illness: [name] [name] is a 70 y.o. who identifies as a female who was assigned female at birth, and is being seen today after testing positive for COVID yesterday. Her symptoms today include nasal congestion.  Her husband tested positive last week and she has been testing daily   She has A-fib and was hospitalized last week to get started on Tikosyn.   She spoke with her provider yesterday and was instructed to use Mucinex and tylenol to manage symptoms.   She has sent Mychart messages to her cardiologist's office to keep them up to date along with her PCP.   She has not had COVID in the past  She has had 4 vaccines so far   She has had walking pneumonia 15 years ago  Denies a smoking history  Denies a history of asthma or COPD     Problems:  Patient Active Problem List   Diagnosis Date Noted   Secondary hypercoagulable state ([place]) 05/31/2021   Paroxysmal A-fib ([place]) 05/31/2021   Posterior capsular opacification non visually significant of left eye 07/19/2020   Left epiretinal membrane 07/19/2020   Right epiretinal membrane 07/19/2020   Low back pain 07/14/2020   Pain in left leg 07/14/2020   Educated about COVID-19 virus  infection 05/30/2020   Chronic pain of right knee 03/31/2019   Essential hypertension 06/30/2017   Age related osteoporosis 10/19/2016   Paroxysmal atrial fibrillation ([place]) 09/27/2015   Hemorrhage of rectum and anus 04/16/2014   Special screening for malignant neoplasms, colon 04/16/2014   Hemorrhoids, unspecified hemorrhoid type 04/16/2014   Plantar wart of left foot 05/14/2013   Foot callus 05/14/2013   Bilateral bunions 03/13/2013   Hypertrophic toenail 03/13/2013   Vision disturbance 03/13/2013   Post-nasal drainage 05/05/2012   Aortic insufficiency 01/16/2012   Macular degeneration 10/12/2011   ANXIETY, SITUATIONAL 05/08/2008    Vitamin D insufficiency 05/22/2007   ELEVATED BLOOD PRESSURE WITHOUT DIAGNOSIS OF HYPERTENSION 05/22/2007   Allergic rhinitis due to allergen 03/28/2007    Allergies:  Allergies  Allergen Reactions   Latex Hives   Other Rash and Other (See Comments)    Adhesive pads left on skin for extended periods = BURNS AND RASHES ON THE SKIN!!!!   Codeine Phosphate Other (See Comments)    Drowsiness    Demerol [Meperidine] Nausea And Vomiting   Protonix [Pantoprazole] Rash   Sulfa Antibiotics Rash   Tens Therapy Replace Back Pads Itching and Rash   Medications:  Current Outpatient Medications:    acetaminophen (TYLENOL) 500 MG tablet, Take 1,000 mg by mouth every 6 (six) hours as needed for moderate pain., Disp: , Rfl:    alendronate (FOSAMAX) 70 MG tablet, TAKE 1 TABLET BY MOUTH ONCE A WEEK WITH A FULL GLASS OF WATER ON AN EMPTY STOMACH, Disp: 12 tablet, Rfl: 3   apixaban (ELIQUIS) 5 MG TABS tablet, TAKE 1 TABLET BY MOUTH  TWICE DAILY, Disp: 180 tablet, Rfl: 1   Calcium Carb-Cholecalciferol (CALCIUM+D3 PO), Take 1 tablet by mouth daily with supper., Disp: , Rfl:    diltiazem (CARDIZEM) 30 MG tablet, Take 1 tablet every 4 hours AS NEEDED for breakthrough AFIB, Disp: 45 tablet, Rfl: 1   diltiazem (TIAZAC) 300 MG 24 hr capsule, Take 1 capsule (300 mg total) by mouth daily., Disp: 90 capsule, Rfl: 3   dofetilide (TIKOSYN) 250 MCG capsule, Take 1 capsule (250 mcg total) by mouth 2 (two) times daily., Disp: 60 capsule, Rfl: 3   LORazepam (ATIVAN) 0.5 MG tablet, Take 0.5 mg by mouth daily as needed for anxiety (when flying)., Disp: , Rfl:    metoprolol tartrate (LOPRESSOR) 25 MG tablet, Take 1 tablet (25 mg total) by mouth 2 (two) times daily., Disp: 180 tablet, Rfl: 3   Multiple Vitamins-Minerals (PRESERVISION AREDS PO), Take 1 capsule by mouth in the morning and at bedtime., Disp: , Rfl:    potassium chloride (KLOR-CON) 10 MEQ tablet, Take 1 tablet (10 mEq total) by mouth daily., Disp: 30 tablet,  Rfl: 0  Observations/Objective: Patient is well-developed, well-nourished in no acute distress.  Resting comfortably at home.  Head is normocephalic, atraumatic.  No labored breathing.  Speech is clear and coherent with logical content.  Patient is alert and oriented at baseline.    Assessment and Plan: 1. COVID-19  - molnupiravir EUA 200 mg CAPS; Take 4 capsules (800 mg total) by mouth 2 (two) times daily for 5 days.  Dispense: 40 capsule; Refill: 0    Patient will consider anti-viral Continue to manage symptoms with over the counter medications as discussed Concentrate on hydration and high protein snacks.    Follow Up Instructions: I discussed the assessment and treatment plan with the patient. The patient was provided an opportunity to ask questions and all were answered. The  patient agreed with the plan and demonstrated an understanding of the instructions.  A copy of instructions were sent to the patient via MyChart.  The patient was advised to call back or seek an in-person evaluation if the symptoms worsen or if the condition fails to improve as anticipated.  Time:  I spent 20 minutes with the patient via telehealth technology discussing the above problems/concerns.    [name], FNP

## 2021-06-13 NOTE — Telephone Encounter (Signed)
Patient called to inform PCP she tested positive for Covid no appt available advised pt to schedule a MyChart appt with a provider. Pt is concerned because pt is taking a new medication and would like to get some advice from PCP

## 2021-06-15 NOTE — Telephone Encounter (Signed)
Plain mucinex should be ok . (Used to try to loosen the mucous  easier to cough out  )   . But OK to not take as  it is not a curative medication  more for help with symptoms . Saline nose spray  / drop sto help clear nose .

## 2021-06-21 ENCOUNTER — Telehealth (HOSPITAL_COMMUNITY): Payer: Self-pay

## 2021-06-21 MED ORDER — POTASSIUM CHLORIDE CRYS ER 20 MEQ PO TBCR: EXTENDED_RELEASE_TABLET | ORAL | 3 refills | Status: DC

## 2021-06-21 MED ORDER — POTASSIUM CHLORIDE CRYS ER 20 MEQ PO TBCR: EXTENDED_RELEASE_TABLET | ORAL | 0 refills | Status: DC

## 2021-06-21 NOTE — Telephone Encounter (Signed)
Patient called and states she needs a refill on her Potassium 43mq- Sent in medication- 180 tablets with 3 Refills.

## 2021-06-21 NOTE — Addendum Note (Signed)
Addended by: Maryln Manuel F on: 06/21/2021 04:49 PM   Modules accepted: Orders

## 2021-06-21 NOTE — Telephone Encounter (Signed)
Patient called back and wanted her Potassium 20 meq sent in to her local pharmacy for a 30 day supply and her 90 day supply to go to [facility]. Patient notified medication was sent to her pharmacy. Patient verbalized understanding.

## 2021-06-23 NOTE — Telephone Encounter (Signed)
Go ahead and take the plain Mucinex to see if that would help I do not see a downside.  You are having prolonged symptoms but it is not out of the ordinary and not rare to see this as long as you are getting better over time.

## 2021-06-24 ENCOUNTER — Emergency Department (HOSPITAL_COMMUNITY): Payer: Medicare Other

## 2021-06-24 ENCOUNTER — Encounter (HOSPITAL_COMMUNITY): Payer: Self-pay | Admitting: Emergency Medicine

## 2021-06-24 ENCOUNTER — Other Ambulatory Visit: Payer: Self-pay

## 2021-06-24 ENCOUNTER — Emergency Department (HOSPITAL_COMMUNITY)
Admission: EM | Admit: 2021-06-24 | Discharge: 2021-06-24 | Disposition: A | Discharge status: Home / Self Care | Payer: Medicare Other | Attending: Emergency Medicine | Admitting: Emergency Medicine

## 2021-06-24 ENCOUNTER — Telehealth: Payer: Self-pay | Admitting: Internal Medicine

## 2021-06-24 DIAGNOSIS — U071 COVID-19: Secondary | ICD-10-CM | POA: Diagnosis not present

## 2021-06-24 DIAGNOSIS — J189 Pneumonia, unspecified organism: Secondary | ICD-10-CM | POA: Diagnosis not present

## 2021-06-24 DIAGNOSIS — I480 Paroxysmal atrial fibrillation: Secondary | ICD-10-CM | POA: Insufficient documentation

## 2021-06-24 DIAGNOSIS — R059 Cough, unspecified: Secondary | ICD-10-CM | POA: Diagnosis present

## 2021-06-24 DIAGNOSIS — Z91040 Latex allergy status: Secondary | ICD-10-CM | POA: Diagnosis not present

## 2021-06-24 DIAGNOSIS — Z7901 Long term (current) use of anticoagulants: Secondary | ICD-10-CM | POA: Insufficient documentation

## 2021-06-24 DIAGNOSIS — I10 Essential (primary) hypertension: Secondary | ICD-10-CM | POA: Diagnosis not present

## 2021-06-24 DIAGNOSIS — Z79899 Other long term (current) drug therapy: Secondary | ICD-10-CM | POA: Insufficient documentation

## 2021-06-24 DIAGNOSIS — J1282 Pneumonia due to coronavirus disease 2019: Secondary | ICD-10-CM | POA: Diagnosis not present

## 2021-06-24 DIAGNOSIS — R5383 Other fatigue: Secondary | ICD-10-CM

## 2021-06-24 DIAGNOSIS — Z8616 Personal history of COVID-19: Secondary | ICD-10-CM | POA: Insufficient documentation

## 2021-06-24 LAB — COMPREHENSIVE METABOLIC PANEL
ALT: 18 U/L (ref 0–44)
AST: 26 U/L (ref 15–41)
Albumin: 2.8 g/dL — ABNORMAL LOW (ref 3.5–5.0)
Alkaline Phosphatase: 67 U/L (ref 38–126)
Anion gap: 8 (ref 5–15)
BUN: 15 mg/dL (ref 8–23)
CO2: 23 mmol/L (ref 22–32)
Calcium: 8.7 mg/dL — ABNORMAL LOW (ref 8.9–10.3)
Chloride: 100 mmol/L (ref 98–111)
Creatinine, Ser: 0.69 mg/dL (ref 0.44–1.00)
GFR, Estimated: >60 mL/min (ref >60)
Glucose, Bld: 124 mg/dL — ABNORMAL HIGH (ref 70–99)
Potassium: 4.2 mmol/L (ref 3.5–5.1)
Sodium: 131 mmol/L — ABNORMAL LOW (ref 135–145)
Total Bilirubin: 1.2 mg/dL (ref 0.3–1.2)
Total Protein: 6.6 g/dL (ref 6.5–8.1)

## 2021-06-24 LAB — CBC WITH DIFFERENTIAL/PLATELET
Abs Immature Granulocytes: 0.12 10*3/uL — ABNORMAL HIGH (ref 0.00–0.07)
Basophils Absolute: 0 10*3/uL (ref 0.0–0.1)
Basophils Relative: 0 %
Eosinophils Absolute: 0 10*3/uL (ref 0.0–0.5)
Eosinophils Relative: 0 %
HCT: 41.3 % (ref 36.0–46.0)
Hemoglobin: 13.8 g/dL (ref 12.0–15.0)
Immature Granulocytes: 1 %
Lymphocytes Relative: 6 %
Lymphs Abs: 1.2 10*3/uL (ref 0.7–4.0)
MCH: 32.9 pg (ref 26.0–34.0)
MCHC: 33.4 g/dL (ref 30.0–36.0)
MCV: 98.6 fL (ref 80.0–100.0)
Monocytes Absolute: 2.1 10*3/uL — ABNORMAL HIGH (ref 0.1–1.0)
Monocytes Relative: 10 %
Neutro Abs: 17 10*3/uL — ABNORMAL HIGH (ref 1.7–7.7)
Neutrophils Relative %: 83 %
Platelets: 303 10*3/uL (ref 150–400)
RBC: 4.19 MIL/uL (ref 3.87–5.11)
RDW: 12 % (ref 11.5–15.5)
WBC: 20.5 10*3/uL — ABNORMAL HIGH (ref 4.0–10.5)
nRBC: 0 % (ref 0.0–0.2)

## 2021-06-24 LAB — RESP PANEL BY RT-PCR (FLU A&B, COVID) ARPGX2
Influenza A by PCR: NEGATIVE
Influenza B by PCR: NEGATIVE
SARS Coronavirus 2 by RT PCR: POSITIVE — AB

## 2021-06-24 LAB — TSH: TSH: 0.891 u[IU]/mL (ref 0.350–4.500)

## 2021-06-24 LAB — MAGNESIUM: Magnesium: 1.9 mg/dL (ref 1.7–2.4)

## 2021-06-24 MED ORDER — LACTATED RINGERS IV BOLUS
500.0000 mL | Freq: Once | INTRAVENOUS | Status: AC
  Administered 2021-06-24: 500 mL via INTRAVENOUS

## 2021-06-24 MED ORDER — AMOXICILLIN-POT CLAVULANATE 875-125 MG PO TABS: 1.0000 | ORAL_TABLET | Freq: Two times a day (BID) | ORAL | 0 refills | Status: DC

## 2021-06-24 NOTE — ED Notes (Signed)
Pt ambulatory to waiting room. Pt verbalized understanding of discharge instructions.   

## 2021-06-24 NOTE — ED Provider Notes (Addendum)
[facility] EMERGENCY DEPARTMENT Provider Note   CSN: [id number] Arrival date & time: 06/24/21  1009     History Chief Complaint  Patient presents with   Cough    [name] [name] is a 70 y.o. female.  HPI  70 year old female with a history of aortic insufficiency, paroxysmal atrial fibrillation on Eliquis, hypertension, recent COVID-19 diagnosis 12 days ago, since tested negative presenting to the emerged department with worsening cough for the past week.  The patient states that she has had roughly 2 weeks of symptoms of COVID-19.  She recently tested negative.  Still endorses a cough.  She endorses anorexia and significantly decreased p.o. intake over the past few days.  She generally feels fatigued.  She denies any fevers or chills.  She endorses some productive sputum.  She currently denies any chest pain, nausea, vomiting, abdominal pain, diarrhea or dysuria.  Past Medical History:  Diagnosis Date   Allergic rhinitis    Aortic insufficiency    CAT SCRATCH 09/10/2009   Qualifier: Diagnosis of  By: Regis Bill MD, [name]    Clavicle fracture 3/08   left   Diverticulosis    HTN (hypertension)    Osteopenia    Paroxysmal atrial fibrillation (HCC)    Vision disturbance 03/13/2013   Negative neuro MRA MRI    Patient Active Problem List   Diagnosis Date Noted   Secondary hypercoagulable state ([place]) 05/31/2021   Paroxysmal A-fib ([place]) 05/31/2021   Posterior capsular opacification non visually significant of left eye 07/19/2020   Left epiretinal membrane 07/19/2020   Right epiretinal membrane 07/19/2020   Low back pain 07/14/2020   Pain in left leg 07/14/2020   Educated about COVID-19 virus infection 05/30/2020   Chronic pain of right knee 03/31/2019   Essential hypertension 06/30/2017   Age related osteoporosis 10/19/2016   Paroxysmal atrial fibrillation ([place]) 09/27/2015   Hemorrhage of rectum and anus 04/16/2014   Special screening for malignant neoplasms,  colon 04/16/2014   Hemorrhoids, unspecified hemorrhoid type 04/16/2014   Plantar wart of left foot 05/14/2013   Foot callus 05/14/2013   Bilateral bunions 03/13/2013   Hypertrophic toenail 03/13/2013   Vision disturbance 03/13/2013   Post-nasal drainage 05/05/2012   Aortic insufficiency 01/16/2012   Macular degeneration 10/12/2011   ANXIETY, SITUATIONAL 05/08/2008   Vitamin D insufficiency 05/22/2007   ELEVATED BLOOD PRESSURE WITHOUT DIAGNOSIS OF HYPERTENSION 05/22/2007   Allergic rhinitis due to allergen 03/28/2007    Past Surgical History:  Procedure Laterality Date   ABDOMINAL HYSTERECTOMY     pt denies 04/15/14   APPENDECTOMY     ATRIAL FIBRILLATION ABLATION N/A 02/01/2021   Procedure: ATRIAL FIBRILLATION ABLATION;  Surgeon: Thompson Grayer, MD;  Location: [place] [facility];  Service: Cardiovascular;  Laterality: N/A;   LAPAROSCOPIC SALPINGO OOPHERECTOMY Right    TONSILLECTOMY AND ADENOIDECTOMY       OB History   No obstetric history on file.     Family History  Problem Relation Age of Onset   Scleroderma Mother    Hyperlipidemia Mother    Coronary artery disease Father 41   Esophageal cancer Paternal Grandmother     Social History   Tobacco Use   Smoking status: Never   Smokeless tobacco: Never  Substance Use Topics   Alcohol use: No    Comment: never   Drug use: No    Home Medications Prior to Admission medications   Medication Sig Start Date End Date Taking? Authorizing Provider  amoxicillin-clavulanate (AUGMENTIN) [phone]  MG tablet Take 1 tablet by mouth every 12 (twelve) hours. 06/24/21  Yes Regan Lemming, MD  acetaminophen (TYLENOL) 500 MG tablet Take 1,000 mg by mouth every 6 (six) hours as needed for moderate pain.    [provider]  alendronate (FOSAMAX) 70 MG tablet TAKE 1 TABLET BY MOUTH ONCE A WEEK WITH A FULL GLASS OF WATER ON AN EMPTY STOMACH 06/13/21   Philemon Kingdom, MD  apixaban (ELIQUIS) 5 MG TABS tablet TAKE 1 TABLET BY MOUTH   TWICE DAILY 05/20/21   Allred, Jeneen Rinks, MD  Calcium Carb-Cholecalciferol (CALCIUM+D3 PO) Take 1 tablet by mouth daily with supper.    [provider]  diltiazem (CARDIZEM) 30 MG tablet Take 1 tablet every 4 hours AS NEEDED for breakthrough AFIB 04/07/21   [name], [name], PA  diltiazem (TIAZAC) 300 MG 24 hr capsule Take 1 capsule (300 mg total) by mouth daily. 05/09/21   Minus Breeding, MD  dofetilide (TIKOSYN) 250 MCG capsule Take 1 capsule (250 mcg total) by mouth 2 (two) times daily. 06/09/21   [name], [name], PA  LORazepam (ATIVAN) 0.5 MG tablet Take 0.5 mg by mouth daily as needed for anxiety (when flying).    [provider]  metoprolol tartrate (LOPRESSOR) 25 MG tablet Take 1 tablet (25 mg total) by mouth 2 (two) times daily. 11/09/20   Minus Breeding, MD  Multiple Vitamins-Minerals (PRESERVISION AREDS PO) Take 1 capsule by mouth in the morning and at bedtime.    [provider]  potassium chloride SA (KLOR-CON) 20 MEQ tablet Take one tablet by mouth daily 06/21/21   [name], [name], PA    Allergies    Latex, Other, Codeine phosphate, Demerol [meperidine], Protonix [pantoprazole], Sulfa antibiotics, and Tens therapy replace back pads  Review of Systems   Review of Systems  Constitutional:  Positive for appetite change and fatigue. Negative for chills and fever.  HENT:  Negative for ear pain and sore throat.   Eyes:  Negative for pain and visual disturbance.  Respiratory:  Positive for cough and shortness of breath.   Cardiovascular:  Negative for chest pain and palpitations.  Gastrointestinal:  Negative for abdominal pain and vomiting.  Genitourinary:  Negative for dysuria and hematuria.  Musculoskeletal:  Negative for arthralgias and back pain.  Skin:  Negative for color change and rash.  Neurological:  Negative for seizures and syncope.  All other systems reviewed and are negative.  Physical Exam Updated Vital Signs BP 99/63   Pulse 80   Temp 97.8 F  (36.6 C) (Oral)   Resp (!) 25   SpO2 96%   Physical Exam Vitals and nursing note reviewed.  Constitutional:      General: She is not in acute distress.    Appearance: She is well-developed.  HENT:     Head: Normocephalic and atraumatic.  Eyes:     Conjunctiva/sclera: Conjunctivae normal.  Cardiovascular:     Rate and Rhythm: Normal rate and regular rhythm.     Heart sounds: No murmur heard. Pulmonary:     Effort: Pulmonary effort is normal. No respiratory distress.     Breath sounds: Rhonchi present. No wheezing.  Abdominal:     Palpations: Abdomen is soft.     Tenderness: There is no abdominal tenderness.  Musculoskeletal:     Cervical back: Neck supple.  Skin:    General: Skin is warm and dry.  Neurological:     General: No focal deficit present.     Mental Status: She  is alert. Mental status is at baseline.    ED Results / Procedures / Treatments   Labs (all labs ordered are listed, but only abnormal results are displayed) Labs Reviewed  RESP PANEL BY RT-PCR (FLU A&B, COVID) ARPGX2 - Abnormal; Notable for the following components:      Result Value   SARS Coronavirus 2 by RT PCR POSITIVE (*)    All other components within normal limits  COMPREHENSIVE METABOLIC PANEL - Abnormal; Notable for the following components:   Sodium 131 (*)    Glucose, Bld 124 (*)    Calcium 8.7 (*)    Albumin 2.8 (*)    All other components within normal limits  CBC WITH DIFFERENTIAL/PLATELET - Abnormal; Notable for the following components:   WBC 20.5 (*)    Neutro Abs 17.0 (*)    Monocytes Absolute 2.1 (*)    Abs Immature Granulocytes 0.12 (*)    All other components within normal limits  MAGNESIUM  TSH    EKG EKG Interpretation  Date/Time:  Friday June 24 2021 10:55:36 EDT Ventricular Rate:  82 PR Interval:  159 QRS Duration: 98 QT Interval:  406 QTC Calculation: 475 R Axis:   62 Text Interpretation: Sinus rhythm Consider right atrial enlargement Left ventricular  hypertrophy Confirmed by Regan Lemming ([id number]) on 06/24/2021 11:10:00 AM  Radiology DG Chest Portable 1 View  Result Date: 06/24/2021 CLINICAL DATA:  70 year old female with history of cough. COVID positive patient. EXAM: PORTABLE CHEST 1 VIEW COMPARISON:  Chest x-ray 08/11/2016. FINDINGS: Patchy ill-defined opacities and areas of interstitial prominence are noted in the right mid to lower lung and at the left lung base. No confluent consolidative airspace disease. No definite pleural effusions. No pneumothorax. No definite suspicious appearing pulmonary nodules or masses are noted. Patchy areas of peribronchial cuffing. No evidence of pulmonary edema. Heart size is normal. Upper mediastinal contours are within normal limits. Atherosclerotic calcifications in the thoracic aorta. IMPRESSION: 1. Patchy areas of interstitial prominence an ill-defined opacities in the lungs bilaterally (right greater than left), likely reflective of multilobar bilateral pneumonia in the setting of known COVID infection. 2. Aortic atherosclerosis. Electronically Signed   By: Vinnie Langton M.D.   On: 06/24/2021 11:25    Procedures Procedures   Medications Ordered in ED Medications  lactated ringers bolus 500 mL (0 mLs Intravenous Stopped 06/24/21 1305)    ED Course  I have reviewed the triage vital signs and the nursing notes.  Pertinent labs & imaging results that were available during my care of the patient were reviewed by me and considered in my medical decision making (see chart for details).    MDM Rules/Calculators/A&P                           70 year old female with a history of aortic insufficiency, paroxysmal atrial fibrillation on Eliquis, hypertension, recent COVID-19 diagnosis 12 days ago, since tested negative presenting to the emerged department with worsening cough for the past week.  The patient states that she has had roughly 2 weeks of symptoms of COVID-19.  She recently tested negative.  Still  endorses a cough.  She endorses anorexia and significantly decreased p.o. intake over the past few days.  She generally feels fatigued.  She denies any fevers or chills.  She endorses some productive sputum.  She currently denies any chest pain, nausea, vomiting, abdominal pain, diarrhea or dysuria.  On arrival, the patient was afebrile, hemodynamically stable, saturating  well on room air, not tachycardic or tachypneic.  She had mild rhonchi on auscultation but otherwise had a reassuring physical exam.  Concern for possible developing bacterial pneumonia following her COVID-19 infection.  Additional concern for electrolyte abnormality, mild hypovolemia in setting of poor p.o. intake.  We will obtain EKG, chest x-ray, screening labs, administer IV fluid bolus and reassess.  EKG revealed sinus rhythm, no ST segment changes, chest x-ray revealed multifocal opacities consistent with prior COVID-19 infection with right greater than left opacities.  The patient did have a leukocytosis to 20.  Symptoms are concerning for developing bacterial pneumonia post COVID-19 infection.  Patient is overall well-appearing, tolerating oral intake, not hypoxic, ambulatory without desaturations in the emergency department.  We will attempt outpatient treatment with oral antibiotics.   Final Clinical Impression(s) / ED Diagnoses Final diagnoses:  Community acquired pneumonia, unspecified laterality  Fatigue, unspecified type  COVID-19    Rx / DC Orders ED Discharge Orders          Ordered    amoxicillin-clavulanate (AUGMENTIN) 875-125 MG tablet  Every 12 hours        06/24/21 1403             Regan Lemming, MD 06/24/21 1118    Regan Lemming, MD 06/24/21 1430

## 2021-06-24 NOTE — Telephone Encounter (Signed)
Patient called wanting advice on anything that would help the symptoms she's having from covid. Patient is unable to shake cough. It's been past the ten days and she's still feeling bad. She has tested negative this past Tuesday, but hasn't tested again recently. Patient is forcing herself to eat as she has little interest in food, states she has lost some weight. She was recommended plain mucinex by Dr.[name] but it doesn't seem to be helping either.    Good callback number is [phone]    Please Advise

## 2021-06-24 NOTE — ED Triage Notes (Signed)
Pt reports cough x 1 week. Pt reports positive COVID test 12 days ago. Denies chest pain/SHOB.

## 2021-06-26 NOTE — Telephone Encounter (Signed)
I see that you have been given  antibiotic for possible  bacterial pneumonia on top of the covid  infection.    If not getting better we may involve   pulmonary team .   Advise   status  treport this week .  Hope you are feeling some better .  Also make sure you share any heart rate  concerns with Cards team .

## 2021-06-27 ENCOUNTER — Telehealth (HOSPITAL_COMMUNITY): Payer: Self-pay | Admitting: *Deleted

## 2021-06-27 NOTE — Addendum Note (Signed)
Addended by: Rosalee Kaufman L on: 06/27/2021 11:06 AM   Modules accepted: Orders

## 2021-06-27 NOTE — Telephone Encounter (Signed)
Noted  

## 2021-06-27 NOTE — Addendum Note (Signed)
Addended by: Nilda Riggs on: 06/27/2021 10:48 AM   Modules accepted: Orders

## 2021-06-27 NOTE — Telephone Encounter (Signed)
Patient called into clinic very frustrated and tearful on phone regarding her overall health status with covid, pneumonia and continued breakthrough afib over the last 2 weeks. Pt states she seems to have afib for part of just about every day. Educated pt on illness being contributor to afib and her burden should improve as her pneumonia improves. She has follow up with Oda Kilts PA after completion of augmentin and can be further assessed for needs of medication change if afib burden continues to increase. She will continue to use PRN cardizem '30mg'$  for breakthrough for elevated heart rates. Pt in agreement.

## 2021-06-27 NOTE — Telephone Encounter (Signed)
Referral has been placed and the pt is aware.

## 2021-06-29 ENCOUNTER — Telehealth: Payer: Self-pay | Admitting: Internal Medicine

## 2021-06-29 DIAGNOSIS — Z20822 Contact with and (suspected) exposure to covid-19: Secondary | ICD-10-CM | POA: Diagnosis not present

## 2021-06-29 NOTE — Telephone Encounter (Signed)
Per patient's chart, she tested positive for covid on 06/24/21 while at the ED. Will check on office protocol for office visit.

## 2021-06-29 NOTE — Telephone Encounter (Signed)
I have messaged the pulmonary office Dr. Chase Caller to try to get her evaluated sooner.

## 2021-06-29 NOTE — Telephone Encounter (Addendum)
Seeing Dr Lamonte Sakai 07/05/21 . Wil close [employer]. Niothiug further needed xxx  I just got a message from Dr. Regis Bill the patient actually tested positive home antigen 06/13/2021.  The 06/24/2021 is a PCR.  Because the first test was on 06/13/2021 I think it is okay to see her tomorrow 06/30/2021.     xxxxxxxxxxxxxxxxxxxxxxxx Hi Lauren/Triage   Dr Regis Bill just sent a secure chat. Wants this patient seen ASAP. I only ave 5 patient 06/30/21 morning. Can yo put her in 11.30am please. Will ensure CMA gets to lunch on time  Thanks  MR  xxx  HI  MR THis patient has underlying cv disease and  covid associated  pna  ( dx ed ) given antibiotic  and as you can see she is very concerned  (as well as I )about her sx   dyspnea etc . Can you arrange f  to see her asap? We could get her in office   sooner than the spetember 15 appt given   but not sure would  as helpful.  thanks for you help.   WP

## 2021-06-29 NOTE — Telephone Encounter (Signed)
I spoke with a representative at [facility] pulmonary and they stated that 09/15 is the earliest available time for the pt to be scheduled. The representative stated that the pt could not be seen by a PA since the pt is new to their office. Please advise.

## 2021-06-29 NOTE — Telephone Encounter (Signed)
Checked the office policy, since she tested positive on 06/24/21, she can not be seen in the office for 10 days.

## 2021-06-30 NOTE — Telephone Encounter (Signed)
Keep the appt   dont need to retest for covid . They can fu and assess any testing needed for the pneumonia.  If getting worse over weekend  contact on call team

## 2021-06-30 NOTE — Telephone Encounter (Signed)
Original sx were about August 14 and tested negative this week .

## 2021-07-04 NOTE — Progress Notes (Signed)
PCP:  Burnis Medin, MD Primary Cardiologist: Minus Breeding, MD Electrophysiologist: Thompson Grayer, MD   [name] [name] is a 70 y.o. female seen today for Thompson Grayer, MD for routine electrophysiology followup.  Since last being seen in our clinic the patient reports doing OK. She had COVID immediately after her hospitalization and continues to struggle with symptoms of fatigue and intermittent cough. Sees a pulmonologist soon. She was having daily AF with COVID, but this is gradually improving. Hasn't noticed any over the past few days. she denies chest pain, PND, orthopnea, nausea, vomiting, dizziness, syncope, edema, weight gain, or early satiety.  Past Medical History:  Diagnosis Date   Allergic rhinitis    Aortic insufficiency    CAT SCRATCH 09/10/2009   Qualifier: Diagnosis of  By: Regis Bill MD, [name]    Clavicle fracture 3/08   left   Diverticulosis    HTN (hypertension)    Osteopenia    Paroxysmal atrial fibrillation (HCC)    Vision disturbance 03/13/2013   Negative neuro MRA MRI   Past Surgical History:  Procedure Laterality Date   ABDOMINAL HYSTERECTOMY     pt denies 04/15/14   APPENDECTOMY     ATRIAL FIBRILLATION ABLATION N/A 02/01/2021   Procedure: ATRIAL FIBRILLATION ABLATION;  Surgeon: Thompson Grayer, MD;  Location: [facility];  Service: Cardiovascular;  Laterality: N/A;   LAPAROSCOPIC SALPINGO OOPHERECTOMY Right    TONSILLECTOMY AND ADENOIDECTOMY      Current Outpatient Medications  Medication Sig Dispense Refill   acetaminophen (TYLENOL) 500 MG tablet Take 1,000 mg by mouth every 6 (six) hours as needed for moderate pain.     alendronate (FOSAMAX) 70 MG tablet TAKE 1 TABLET BY MOUTH ONCE A WEEK WITH A FULL GLASS OF WATER ON AN EMPTY STOMACH 12 tablet 3   apixaban (ELIQUIS) 5 MG TABS tablet TAKE 1 TABLET BY MOUTH  TWICE DAILY 180 tablet 1   Calcium Carb-Cholecalciferol (CALCIUM+D3 PO) Take 1 tablet by mouth daily with supper.     diltiazem (CARDIZEM) 30 MG  tablet Take 1 tablet every 4 hours AS NEEDED for breakthrough AFIB 45 tablet 1   diltiazem (TIAZAC) 300 MG 24 hr capsule Take 1 capsule (300 mg total) by mouth daily. 90 capsule 3   dofetilide (TIKOSYN) 250 MCG capsule Take 1 capsule (250 mcg total) by mouth 2 (two) times daily. 60 capsule 3   LORazepam (ATIVAN) 0.5 MG tablet Take 0.5 mg by mouth daily as needed for anxiety (when flying).     metoprolol tartrate (LOPRESSOR) 25 MG tablet Take 1 tablet (25 mg total) by mouth 2 (two) times daily. 180 tablet 3   Multiple Vitamins-Minerals (PRESERVISION AREDS PO) Take 1 capsule by mouth in the morning and at bedtime.     potassium chloride SA (KLOR-CON) 20 MEQ tablet Take one tablet by mouth daily 30 tablet 0   No current facility-administered medications for this visit.    Allergies  Allergen Reactions   Latex Hives   Other Rash and Other (See Comments)    Adhesive pads left on skin for extended periods = BURNS AND RASHES ON THE SKIN!!!!   Codeine Phosphate Other (See Comments)    Drowsiness    Demerol [Meperidine] Nausea And Vomiting   Protonix [Pantoprazole] Rash   Sulfa Antibiotics Rash   Tens Therapy Replace Back Pads Itching and Rash    Social History   Socioeconomic History   Marital status: Married    Spouse name: Not on file  Number of children: 0   Years of education: Not on file   Highest education level: Not on file  Occupational History   Occupation: Veterinary surgeon   Occupation: REPORTER    Employer: [employer]  Tobacco Use   Smoking status: Never   Smokeless tobacco: Never  Substance and Sexual Activity   Alcohol use: No    Comment: never   Drug use: No   Sexual activity: Not on file  Other Topics Concern   Not on file  Social History Narrative   Married   hh of 2    p0g0   Works for [employer]    Social Determinants of Radio broadcast assistant Strain: Low Risk    Difficulty of Paying Living Expenses: Not hard at all  Food Insecurity:  Not on file  Transportation Needs: No Transportation Needs   Lack of Transportation (Medical): No   Lack of Transportation (Non-Medical): No  Physical Activity: Not on file  Stress: Not on file  Social Connections: Not on file  Intimate Partner Violence: Not on file    Review of Systems: All other systems reviewed and are otherwise negative except as noted above.  Physical Exam: Vitals:   07/05/21 0943  BP: 100/62  Pulse: 71  SpO2: 97%  Weight: 99 lb 9.6 oz (45.2 kg)  Height: '5\' 4"'$  (1.626 m)    GEN- The patient is well appearing, alert and oriented x 3 today.   HEENT: normocephalic, atraumatic; sclera clear, conjunctiva pink; hearing intact; oropharynx clear; neck supple, no JVP Lymph- no cervical lymphadenopathy Lungs- Clear to ausculation bilaterally, normal work of breathing.  No wheezes, rales, rhonchi Heart- Regular rate and rhythm, no murmurs, rubs or gallops, PMI not laterally displaced GI- soft, non-tender, non-distended, bowel sounds present, no hepatosplenomegaly Extremities- no clubbing, cyanosis, or edema; DP/PT/radial pulses 2+ bilaterally MS- no significant deformity or atrophy Skin- warm and dry, no rash or lesion Psych- euthymic mood, full affect Neuro- strength and sensation are intact  EKG is ordered. Personal review of EKG from today shows NSR at 71 bpm with stable QTc  Additional studies reviewed include: Previous EP office notes.   Assessment and Plan:  1. Paroxysmal afib S/p ablation 02/01/21, multiple atrial flutter circuits noted. Patient is s/p dofetilide admission 8/2-06/03/21. Continue dofetilide 250 mcg BID. QTc ~480 by EKG today. Check bmet/mag today. Continue Eliquis 5 mg BID Continue metoprolol 25 mg BID Continue diltiazem 300 mg daily with 30 mg PRN q 4 hours for heart racing. Reassurance given that her breakthrough during COVID infection was not unexpected, nor does it indicate medication failure.    2. CHA2DS2VASc score of at least  3 Continue  eliquis 5 mg bid     3. AI If AV repair is indicated, could consider MAZE at that time. Dr. Rayann Heman reached out to Dr. Percival Spanish to inquire about further work up with MRI. He planned to evaluate further as outpatient. He is not scheduled to see her until December. I will send chart today in case needs to be done sooner.   4. HTN Stable, no changes today.  Shirley Friar, PA-C  07/05/21 10:07 AM

## 2021-07-05 ENCOUNTER — Ambulatory Visit: Payer: Medicare Other | Admitting: Student

## 2021-07-05 ENCOUNTER — Encounter: Payer: Self-pay | Admitting: Student

## 2021-07-05 ENCOUNTER — Ambulatory Visit: Payer: Medicare Other | Admitting: Emergency Medicine

## 2021-07-05 ENCOUNTER — Other Ambulatory Visit: Payer: Self-pay

## 2021-07-05 ENCOUNTER — Encounter: Payer: Self-pay | Admitting: Emergency Medicine

## 2021-07-05 VITALS — BP 100/62 | HR 71 | Ht 64.0 in | Wt 99.6 lb

## 2021-07-05 DIAGNOSIS — U071 COVID-19: Secondary | ICD-10-CM | POA: Diagnosis not present

## 2021-07-05 DIAGNOSIS — I509 Heart failure, unspecified: Secondary | ICD-10-CM

## 2021-07-05 DIAGNOSIS — I38 Endocarditis, valve unspecified: Secondary | ICD-10-CM | POA: Diagnosis not present

## 2021-07-05 DIAGNOSIS — D6869 Other thrombophilia: Secondary | ICD-10-CM | POA: Diagnosis not present

## 2021-07-05 DIAGNOSIS — J1282 Pneumonia due to coronavirus disease 2019: Secondary | ICD-10-CM | POA: Diagnosis not present

## 2021-07-05 DIAGNOSIS — I351 Nonrheumatic aortic (valve) insufficiency: Secondary | ICD-10-CM

## 2021-07-05 DIAGNOSIS — I480 Paroxysmal atrial fibrillation: Secondary | ICD-10-CM

## 2021-07-05 LAB — BASIC METABOLIC PANEL
BUN/Creatinine Ratio: 20 (ref 12–28)
BUN: 14 mg/dL (ref 8–27)
CO2: 23 mmol/L (ref 20–29)
Calcium: 9.5 mg/dL (ref 8.7–10.3)
Chloride: 97 mmol/L (ref 96–106)
Creatinine, Ser: 0.71 mg/dL (ref 0.57–1.00)
Glucose: 90 mg/dL (ref 65–99)
Potassium: 4.6 mmol/L (ref 3.5–5.2)
Sodium: 136 mmol/L (ref 134–144)
eGFR: 93 mL/min/{1.73_m2} (ref >59)

## 2021-07-05 LAB — MAGNESIUM: Magnesium: 2.1 mg/dL (ref 1.6–2.3)

## 2021-07-05 MED ORDER — DOFETILIDE 250 MCG PO CAPS: 250.0000 ug | ORAL_CAPSULE | Freq: Two times a day (BID) | ORAL | 3 refills | Status: DC

## 2021-07-05 NOTE — Patient Instructions (Signed)
Please follow with Dr Lamonte Sakai in one month with a CXR on the same day

## 2021-07-05 NOTE — Assessment & Plan Note (Signed)
COVID-19 pneumonitis with a possible superimposed bacterial pneumonia (for which she was treated).  Improving but still has fatigue, difficulty with concentration, cough.  Her chest x-ray showed mild bilateral interstitial prominence probably consistent with COVID-19 versus bacterial pneumonia.  Discussed with her today that this will likely clear but that sometimes there are some sequela left behind after COVID-19.  I would like to repeat her chest x-ray in about 1 month.  Depending on how her symptoms are improving and depending on the chest x-ray we will decide whether she needs a CT scan of the chest to look for any evidence of residual ILD

## 2021-07-05 NOTE — Progress Notes (Signed)
Subjective:    Patient ID: [name] [name], female    DOB: 1951-03-02, 70 y.o.   MRN: [id number]  HPI  70 year old never smoker with history of paroxysmal atrial fibrillation post ablation (Dr. Rayann Heman) and recently started on Tikosyn, hypertension, aortic insufficiency, diverticular disease, allergic rhinitis. She was diagnosed with COVID-19 06/12/2021 with symptoms consisting of sore throat and some URI sx.  She was ordered mulnupiravir on 8/15 but couldn't take it do to possible drug interactions.  She was in the emergency department 06/24/2021 with cough, poor appetite and poor p.o. intake, fatigue, some sputum production, confirmed COVID positive.  Chest X revealed bilateral patchy areas of interstitial prominence right greater than left.  She was treated empirically with antibiotics for possible superimposed bacterial pneumonia.  Today she reports that she is improved some, has a bit better energy but not back to her prior baseline. Her URI sx are better, still has a bit of cough, some clear mucous. Sore throat better. Kept her sense of taste and smell. She did lose some wt - about 10 lbs.    Review of Systems As per HPI  Past Medical History:  Diagnosis Date   Allergic rhinitis    Aortic insufficiency    CAT SCRATCH 09/10/2009   Qualifier: Diagnosis of  By: Regis Bill MD, [name]    Clavicle fracture 3/08   left   Diverticulosis    HTN (hypertension)    Osteopenia    Paroxysmal atrial fibrillation (HCC)    Vision disturbance 03/13/2013   Negative neuro MRA MRI     Family History  Problem Relation Age of Onset   Scleroderma Mother    Hyperlipidemia Mother    Coronary artery disease Father 25   Esophageal cancer Paternal Grandmother      Social History   Socioeconomic History   Marital status: Married    Spouse name: Not on file   Number of children: 0   Years of education: Not on file   Highest education level: Not on file  Occupational History   Occupation: Social worker   Occupation: REPORTER    Employer: [employer]  Tobacco Use   Smoking status: Never   Smokeless tobacco: Never  Substance and Sexual Activity   Alcohol use: No    Comment: never   Drug use: No   Sexual activity: Not on file  Other Topics Concern   Not on file  Social History Narrative   Married   hh of 2    p0g0   Works for [employer]    Social Determinants of Radio broadcast assistant Strain: Low Risk    Difficulty of Paying Living Expenses: Not hard at all  Food Insecurity: Not on file  Transportation Needs: No Transportation Needs   Lack of Transportation (Medical): No   Lack of Transportation (Non-Medical): No  Physical Activity: Not on file  Stress: Not on file  Social Connections: Not on file  Intimate Partner Violence: Not on file    Works as a Probation officer [employer] Has lived [place] and [place]  Allergies  Allergen Reactions   Latex Hives   Other Rash and Other (See Comments)    Adhesive pads left on skin for extended periods = BURNS AND RASHES ON THE SKIN!!!!   Codeine Phosphate Other (See Comments)    Drowsiness    Demerol [Meperidine] Nausea And Vomiting   Protonix [Pantoprazole] Rash   Sulfa Antibiotics Rash   Tens Therapy Replace Back Pads  Itching and Rash     Outpatient Medications Prior to Visit  Medication Sig Dispense Refill   acetaminophen (TYLENOL) 500 MG tablet Take 1,000 mg by mouth every 6 (six) hours as needed for moderate pain.     alendronate (FOSAMAX) 70 MG tablet TAKE 1 TABLET BY MOUTH ONCE A WEEK WITH A FULL GLASS OF WATER ON AN EMPTY STOMACH 12 tablet 3   apixaban (ELIQUIS) 5 MG TABS tablet TAKE 1 TABLET BY MOUTH  TWICE DAILY 180 tablet 1   Calcium Carb-Cholecalciferol (CALCIUM+D3 PO) Take 1 tablet by mouth daily with supper.     diltiazem (CARDIZEM) 30 MG tablet Take 1 tablet every 4 hours AS NEEDED for breakthrough AFIB 45 tablet 1   diltiazem (TIAZAC) 300 MG 24 hr capsule Take 1 capsule (300 mg total) by mouth daily. 90 capsule 3    dofetilide (TIKOSYN) 250 MCG capsule Take 1 capsule (250 mcg total) by mouth 2 (two) times daily. 180 capsule 3   LORazepam (ATIVAN) 0.5 MG tablet Take 0.5 mg by mouth daily as needed for anxiety (when flying).     metoprolol tartrate (LOPRESSOR) 25 MG tablet Take 1 tablet (25 mg total) by mouth 2 (two) times daily. 180 tablet 3   Multiple Vitamins-Minerals (PRESERVISION AREDS PO) Take 1 capsule by mouth in the morning and at bedtime.     potassium chloride SA (KLOR-CON) 20 MEQ tablet Take one tablet by mouth daily 30 tablet 0   No facility-administered medications prior to visit.          Objective:   Physical Exam Vitals:   07/05/21 1328  BP: (!) 102/58  Pulse: 72  Temp: 98.1 F (36.7 C)  TempSrc: Oral  SpO2: 96%  Weight: 99 lb 12.8 oz (45.3 kg)  Height: '5\' 4"'$  (1.626 m)    Gen: Pleasant, thin, in no distress,  normal affect  ENT: No lesions,  mouth clear,  oropharynx clear, no postnasal drip  Neck: No JVD, no stridor  Lungs: No use of accessory muscles, no crackles or wheezing on normal respiration, no wheeze on forced expiration  Cardiovascular: RRR, heart sounds normal, no murmur or gallops, no peripheral edema  Musculoskeletal: No deformities, no cyanosis or clubbing  Neuro: alert, awake, non focal  Skin: Warm, no lesions or rash      Assessment & Plan:   Pneumonia due to COVID-19 virus COVID-19 pneumonitis with a possible superimposed bacterial pneumonia (for which she was treated).  Improving but still has fatigue, difficulty with concentration, cough.  Her chest x-ray showed mild bilateral interstitial prominence probably consistent with COVID-19 versus bacterial pneumonia.  Discussed with her today that this will likely clear but that sometimes there are some sequela left behind after COVID-19.  I would like to repeat her chest x-ray in about 1 month.  Depending on how her symptoms are improving and depending on the chest x-ray we will decide whether she  needs a CT scan of the chest to look for any evidence of residual ILD   Baltazar Apo, MD, PhD 07/05/2021, 2:04 PM [facility] [phone] or if no answer before 7:00PM call [phone] For any issues after 7:00PM please call eLink [phone]

## 2021-07-05 NOTE — Patient Instructions (Signed)
Medication Instructions:  Your physician recommends that you continue on your current medications as directed. Please refer to the Current Medication list given to you today.  *If you need a refill on your cardiac medications before your next appointment, please call your pharmacy*   Lab Work: TODAY: BMET, Mg  If you have labs (blood work) drawn today and your tests are completely normal, you will receive your results only by: [place] (if you have MyChart) OR A paper copy in the mail If you have any lab test that is abnormal or we need to change your treatment, we will call you to review the results.   Follow-Up: At [facility], you and your health needs are our priority.  As part of our continuing mission to provide you with exceptional heart care, we have created designated Provider Care Teams.  These Care Teams include your primary Cardiologist (physician) and Advanced Practice Providers (APPs -  Physician Assistants and Nurse Practitioners) who all work together to provide you with the care you need, when you need it.   Your next appointment:   12/20 with Oda Kilts, PA-C

## 2021-07-05 NOTE — Addendum Note (Signed)
Addended by: Lorretta Harp on: 07/05/2021 02:09 PM   Modules accepted: Orders

## 2021-07-05 NOTE — Telephone Encounter (Signed)
RB please advise. Thanks   See pts note in message.

## 2021-07-05 NOTE — Telephone Encounter (Signed)
Pt saw Dr. Lamonte Sakai for a consult appt today 07/05/21. Nothing further needed.

## 2021-07-11 NOTE — Telephone Encounter (Signed)
I would suggest sugar-free candy to suck on.   Sometimes the cough medicine drops have respiratory irritants such as menthol or eucalyptus oil which can irritate the airway but what ever you choose to try should be sugar-free

## 2021-07-14 ENCOUNTER — Institutional Professional Consult (permissible substitution): Payer: Medicare Other | Admitting: Pulmonary Disease

## 2021-07-15 NOTE — Telephone Encounter (Signed)
Oh Boy Sorry you are getting caught in the confusion of mixed messaged and outdated rules.  Yes cough can last weeks without infectivity I suggest you  get a pcr test for covid   and if negative proves no virus  and you can present that and the fact that this is  many days in the illness . ( But even if positive may not  be contagious but that is the best we have.)   Depending if negative  tell them doctors have seen  you and your cough is left over from the damage of covid but  not a new cough and the  infection risk is gone.  I

## 2021-07-18 NOTE — Telephone Encounter (Signed)
No need for repeat testing .  You can report that your PCR is negative  and pulmonary and PCP although you still have a cough you are not contagious. And do not have covid currently.

## 2021-07-19 ENCOUNTER — Encounter (INDEPENDENT_AMBULATORY_CARE_PROVIDER_SITE_OTHER): Payer: Medicare Other | Admitting: Ophthalmology

## 2021-07-25 ENCOUNTER — Encounter (INDEPENDENT_AMBULATORY_CARE_PROVIDER_SITE_OTHER): Payer: Medicare Other | Admitting: Ophthalmology

## 2021-07-28 NOTE — Telephone Encounter (Signed)
If you are concerned about the pain and not sure  if it is an important  problem ,I suggest you make an in person office visit I will be out of the office for 2 weeks  so make appt   in person with another provider.  I do not think there is any danger in waiting  weeks to do your mammogram as it was done last September and guidelines say you can go up to 2 years.  (Certainly you can get a sore chest from the amount of severe coughing.  Which should get better with time.)

## 2021-08-03 ENCOUNTER — Other Ambulatory Visit: Payer: Self-pay

## 2021-08-03 ENCOUNTER — Ambulatory Visit (INDEPENDENT_AMBULATORY_CARE_PROVIDER_SITE_OTHER): Payer: Medicare Other | Admitting: Family Medicine

## 2021-08-03 VITALS — BP 100/60 | HR 70 | Temp 97.9°F | Wt 97.8 lb

## 2021-08-03 DIAGNOSIS — Z23 Encounter for immunization: Secondary | ICD-10-CM | POA: Diagnosis not present

## 2021-08-03 DIAGNOSIS — R0781 Pleurodynia: Secondary | ICD-10-CM

## 2021-08-03 NOTE — Patient Instructions (Signed)
Go ahead and get left anterior rib X-ray at time you get CXR next week.

## 2021-08-03 NOTE — Progress Notes (Signed)
Established Patient Office Visit  Subjective:  Patient ID: [name] [name], female    DOB: 1950-12-11  Age: 70 y.o. MRN: [id number]  CC:  Chief Complaint  Patient presents with   Breast Pain    Left breast, x 3 weeks, bra line underneath and to the side, has a lingering cough from covid, mammogram scheduled for tomorrow but is afraid that with current pain it may be to painful for her to complete    HPI [name] [name] presents for pain left anterior rib cage area just below the left breast.  She states she had COVID back in August and has had some lingering cough since then.  She had portable chest x-ray back then which showed some question of bilateral infiltrates related to COVID.  She actually saw pulmonologist and has ordered repeat chest x-ray for next Tuesday.  No hemoptysis.  No significant dyspnea.  No fever.  She is scheduled for mammogram tomorrow.  She has not noted any breast masses.  No nipple discharge.  Appetite stable. Weight relatively stable.  No pleuritic pain.  Wt Readings from Last 3 Encounters:  08/03/21 97 lb 12.8 oz (44.4 kg)  07/05/21 99 lb 12.8 oz (45.3 kg)  07/05/21 99 lb 9.6 oz (45.2 kg)     Past Medical History:  Diagnosis Date   Allergic rhinitis    Aortic insufficiency    CAT SCRATCH 09/10/2009   Qualifier: Diagnosis of  By: Regis Bill MD, [name]    Clavicle fracture 3/08   left   Diverticulosis    HTN (hypertension)    Osteopenia    Paroxysmal atrial fibrillation (HCC)    Vision disturbance 03/13/2013   Negative neuro MRA MRI    Past Surgical History:  Procedure Laterality Date   ABDOMINAL HYSTERECTOMY     pt denies 04/15/14   APPENDECTOMY     ATRIAL FIBRILLATION ABLATION N/A 02/01/2021   Procedure: ATRIAL FIBRILLATION ABLATION;  Surgeon: Thompson Grayer, MD;  Location: [facility];  Service: Cardiovascular;  Laterality: N/A;   LAPAROSCOPIC SALPINGO OOPHERECTOMY Right    TONSILLECTOMY AND ADENOIDECTOMY      Family History  Problem Relation  Age of Onset   Scleroderma Mother    Hyperlipidemia Mother    Coronary artery disease Father 14   Esophageal cancer Paternal Grandmother     Social History   Socioeconomic History   Marital status: Married    Spouse name: Not on file   Number of children: 0   Years of education: Not on file   Highest education level: Not on file  Occupational History   Occupation: Veterinary surgeon   Occupation: REPORTER    Employer: [employer]  Tobacco Use   Smoking status: Never   Smokeless tobacco: Never  Substance and Sexual Activity   Alcohol use: No    Comment: never   Drug use: No   Sexual activity: Not on file  Other Topics Concern   Not on file  Social History Narrative   Married   hh of 2    p0g0   Works for [employer]    Social Determinants of Radio broadcast assistant Strain: Low Risk    Difficulty of Paying Living Expenses: Not hard at all  Food Insecurity: Not on file  Transportation Needs: No Transportation Needs   Lack of Transportation (Medical): No   Lack of Transportation (Non-Medical): No  Physical Activity: Not on file  Stress: Not on file  Social  Connections: Not on file  Intimate Partner Violence: Not on file    Outpatient Medications Prior to Visit  Medication Sig Dispense Refill   acetaminophen (TYLENOL) 500 MG tablet Take 1,000 mg by mouth every 6 (six) hours as needed for moderate pain.     alendronate (FOSAMAX) 70 MG tablet TAKE 1 TABLET BY MOUTH ONCE A WEEK WITH A FULL GLASS OF WATER ON AN EMPTY STOMACH 12 tablet 3   apixaban (ELIQUIS) 5 MG TABS tablet TAKE 1 TABLET BY MOUTH  TWICE DAILY 180 tablet 1   Calcium Carb-Cholecalciferol (CALCIUM+D3 PO) Take 1 tablet by mouth daily with supper.     diltiazem (CARDIZEM) 30 MG tablet Take 1 tablet every 4 hours AS NEEDED for breakthrough AFIB 45 tablet 1   diltiazem (TIAZAC) 300 MG 24 hr capsule Take 1 capsule (300 mg total) by mouth daily. 90 capsule 3   dofetilide (TIKOSYN) 250 MCG capsule Take  1 capsule (250 mcg total) by mouth 2 (two) times daily. 180 capsule 3   LORazepam (ATIVAN) 0.5 MG tablet Take 0.5 mg by mouth daily as needed for anxiety (when flying).     metoprolol tartrate (LOPRESSOR) 25 MG tablet Take 1 tablet (25 mg total) by mouth 2 (two) times daily. 180 tablet 3   Multiple Vitamins-Minerals (PRESERVISION AREDS PO) Take 1 capsule by mouth in the morning and at bedtime.     potassium chloride SA (KLOR-CON) 20 MEQ tablet Take one tablet by mouth daily 30 tablet 0   No facility-administered medications prior to visit.    Allergies  Allergen Reactions   Latex Hives   Other Rash and Other (See Comments)    Adhesive pads left on skin for extended periods = BURNS AND RASHES ON THE SKIN!!!!   Codeine Phosphate Other (See Comments)    Drowsiness    Demerol [Meperidine] Nausea And Vomiting   Protonix [Pantoprazole] Rash   Sulfa Antibiotics Rash   Tens Therapy Replace Back Pads Itching and Rash    ROS Review of Systems  Constitutional:  Negative for chills, fever and unexpected weight change.  Respiratory:  Positive for cough. Negative for shortness of breath and wheezing.   Cardiovascular:  Negative for chest pain.  Skin:  Negative for rash.     Objective:    Physical Exam Vitals reviewed.  Cardiovascular:     Rate and Rhythm: Normal rate and regular rhythm.  Pulmonary:     Comments: Left anterior chest wall no visible swelling or erythema.   Breast- no mass noted.   No nipple inversion.  No skin dimpling.  Region of pain- left anterior ribs just below breast- no reproducible tenderness.   Musculoskeletal:     Cervical back: Neck supple.  Lymphadenopathy:     Cervical: No cervical adenopathy.  Skin:    Findings: No rash.  Neurological:     Mental Status: She is alert.    BP 100/60 (BP Location: Left Arm, Patient Position: Sitting, Cuff Size: Normal)   Pulse 70   Temp 97.9 F (36.6 C) (Oral)   Wt 97 lb 12.8 oz (44.4 kg)   SpO2 98%   BMI 16.79 kg/m   Wt Readings from Last 3 Encounters:  08/03/21 97 lb 12.8 oz (44.4 kg)  07/05/21 99 lb 12.8 oz (45.3 kg)  07/05/21 99 lb 9.6 oz (45.2 kg)     Health Maintenance Due  Topic Date Due   TETANUS/TDAP  05/21/2017   INFLUENZA VACCINE  05/30/2021    There are  no preventive care reminders to display for this patient.  Lab Results  Component Value Date   TSH 0.891 06/24/2021   Lab Results  Component Value Date   WBC 20.5 (H) 06/24/2021   HGB 13.8 06/24/2021   HCT 41.3 06/24/2021   MCV 98.6 06/24/2021   PLT 303 06/24/2021   Lab Results  Component Value Date   NA 136 07/05/2021   K 4.6 07/05/2021   CO2 23 07/05/2021   GLUCOSE 90 07/05/2021   BUN 14 07/05/2021   CREATININE 0.71 07/05/2021   BILITOT 1.2 06/24/2021   ALKPHOS 67 06/24/2021   AST 26 06/24/2021   ALT 18 06/24/2021   PROT 6.6 06/24/2021   ALBUMIN 2.8 (L) 06/24/2021   CALCIUM 9.5 07/05/2021   ANIONGAP 8 06/24/2021   EGFR 93 07/05/2021   GFR 84.89 05/17/2018   Lab Results  Component Value Date   CHOL 226 (H) 05/17/2018   Lab Results  Component Value Date   HDL 79.30 05/17/2018   Lab Results  Component Value Date   LDLCALC 129 (H) 05/17/2018   Lab Results  Component Value Date   TRIG 84.0 05/17/2018   Lab Results  Component Value Date   CHOLHDL 3 05/17/2018   No results found for: HGBA1C    Assessment & Plan:   Left anterior rib pain for several weeks in setting of recent Covid with some persistent cough.   Doubt rib fracture    pt has CXR already scheduled for next Tuesday and will add left rib films.  Pt requesting flu vaccine and this is given.    No orders of the defined types were placed in this encounter.   Follow-up: No follow-ups on file.    Carolann Littler, MD

## 2021-08-04 DIAGNOSIS — Z1231 Encounter for screening mammogram for malignant neoplasm of breast: Secondary | ICD-10-CM | POA: Diagnosis not present

## 2021-08-04 LAB — HM MAMMOGRAPHY

## 2021-08-08 ENCOUNTER — Encounter: Payer: Self-pay | Admitting: Internal Medicine

## 2021-08-09 ENCOUNTER — Ambulatory Visit (INDEPENDENT_AMBULATORY_CARE_PROVIDER_SITE_OTHER): Payer: Medicare Other

## 2021-08-09 ENCOUNTER — Other Ambulatory Visit: Payer: Self-pay

## 2021-08-09 ENCOUNTER — Encounter: Payer: Self-pay | Admitting: Emergency Medicine

## 2021-08-09 ENCOUNTER — Telehealth: Payer: Self-pay | Admitting: Pharmacist

## 2021-08-09 ENCOUNTER — Ambulatory Visit: Payer: Medicare Other

## 2021-08-09 ENCOUNTER — Ambulatory Visit: Payer: Medicare Other | Admitting: Emergency Medicine

## 2021-08-09 VITALS — BP 122/62 | HR 66 | Temp 97.6°F | Ht 64.0 in | Wt 95.6 lb

## 2021-08-09 DIAGNOSIS — J1282 Pneumonia due to coronavirus disease 2019: Secondary | ICD-10-CM

## 2021-08-09 DIAGNOSIS — R059 Cough, unspecified: Secondary | ICD-10-CM | POA: Insufficient documentation

## 2021-08-09 DIAGNOSIS — U071 COVID-19: Secondary | ICD-10-CM

## 2021-08-09 DIAGNOSIS — R053 Chronic cough: Secondary | ICD-10-CM | POA: Diagnosis not present

## 2021-08-09 DIAGNOSIS — J849 Interstitial pulmonary disease, unspecified: Secondary | ICD-10-CM

## 2021-08-09 NOTE — Addendum Note (Signed)
Addended by: Gavin Potters R on: 08/09/2021 10:08 AM   Modules accepted: Orders

## 2021-08-09 NOTE — Progress Notes (Signed)
Subjective:    Patient ID: [name] [name], female    DOB: 1951-07-24, 70 y.o.   MRN: [id number]  HPI  70 year old never smoker with history of paroxysmal atrial fibrillation post ablation (Dr. Rayann Heman) and recently started on Tikosyn, hypertension, aortic insufficiency, diverticular disease, allergic rhinitis. She was diagnosed with COVID-19 06/12/2021 with symptoms consisting of sore throat and some URI sx.  She was ordered mulnupiravir on 8/15 but couldn't take it do to possible drug interactions.  She was in the emergency department 06/24/2021 with cough, poor appetite and poor p.o. intake, fatigue, some sputum production, confirmed COVID positive.  Chest X revealed bilateral patchy areas of interstitial prominence right greater than left.  She was treated empirically with antibiotics for possible superimposed bacterial pneumonia.  Today she reports that she is improved some, has a bit better energy but not back to her prior baseline. Her URI sx are better, still has a bit of cough, some clear mucous. Sore throat better. Kept her sense of taste and smell. She did lose some wt - about 10 lbs.   ROV 08/09/21 --follow-up visit for 70 year old woman with A. fib, hypertension, AI, allergic rhinitis.  I saw her 1 month ago after she was diagnosed with COVID-19 in mid August.  She had patchy bilateral infiltrates on her chest x-ray.  She returns today reporting that she is continuing to have cough, dry. She has some L upper rib pain extending anteriorly under L breast > improved. Had been associated w cough to some degree. Rib films have been ordered buy t they couldn't be done here. Denies any GERD or PND, no allergies.   Chest x-ray from today reviewed by me shows some clearing of interstitial prominence noted on her film from 06/24/2021.  Persistent hyperinflation and large lung volumes.   Review of Systems As per HPI       Objective:   Physical Exam Vitals:   08/09/21 0920  BP: 122/62  Pulse:  66  Temp: 97.6 F (36.4 C)  TempSrc: Oral  SpO2: 99%  Weight: 95 lb 9.6 oz (43.4 kg)  Height: 5\' 4"  (1.626 m)    Gen: Pleasant, thin, in no distress,  normal affect  ENT: No lesions,  mouth clear,  oropharynx clear, no postnasal drip  Neck: No JVD, no stridor  Lungs: No use of accessory muscles, no crackles or wheezing on normal respiration, no wheeze on forced expiration  Cardiovascular: RRR, heart sounds normal, no murmur or gallops, no peripheral edema  Musculoskeletal: No deformities, no cyanosis or clubbing  Neuro: alert, awake, non focal  Skin: Warm, no lesions or rash      Assessment & Plan:   Pneumonia due to COVID-19 virus Reviewed her chest x-ray from today.  There has been some interval improvement in midlung interstitial prominence bilaterally.  There may still be some residual interstitial change.  I think a CT chest would be better test to determine whether there is any subtle ILD present.  We will arrange for this.  I will also perform pulmonary function testing to establish her baseline since she is still experiencing some exertional shortness of breath, decreased exertional tolerance since she had COVID.  Cough Continues to have dry cough that became active when she had COVID-19 in mid August.  She denies any GERD, denies any significant PND or allergies.  Not clear to me that empiric therapy of either is indicated at this time.  This could be associated with COVID change, ILD, even evolving  airflow obstruction post viral infection.  I think we should start with CT chest and pulmonary function testing.  Time spent 32 minutes  Baltazar Apo, MD, PhD 08/09/2021, 9:54 AM [facility] [phone] or if no answer before 7:00PM call [phone] For any issues after 7:00PM please call eLink [phone]

## 2021-08-09 NOTE — Assessment & Plan Note (Signed)
Reviewed her chest x-ray from today.  There has been some interval improvement in midlung interstitial prominence bilaterally.  There may still be some residual interstitial change.  I think a CT chest would be better test to determine whether there is any subtle ILD present.  We will arrange for this.  I will also perform pulmonary function testing to establish her baseline since she is still experiencing some exertional shortness of breath, decreased exertional tolerance since she had COVID.

## 2021-08-09 NOTE — Chronic Care Management (AMB) (Signed)
    Chronic Care Management Pharmacy Assistant   Name: [name] [name]  MRN: [id number] DOB: 1951-04-11  08/10/21 APPOINTMENT REMINDER   [name] was reminded to have all medications, supplements and any blood glucose and blood pressure readings available for review with [name], Pharm. D, at her telephone visit on 08/10/21 at 8:30am.   Questions: Have you had any recent office visit or specialist visit outside of [facility]? No.   Are there any concerns you would like to discuss during your office visit? Patient iss till trying to get over her covid pneumonia.  Are you having any problems obtaining your medications? No.  If patient has any PAP medications ask if they are having any problems getting their PAP medication or refill? No Patient assistance.  Care Gaps:  AWV - message sent to Ramond Craver CMA to schedule. Tetanus/TDAP - overdue since 2018  Star Rating Drug:  None.  Any gaps in medications fill history? N/A  [facility] Pharmacist Assistant [phone]

## 2021-08-09 NOTE — Patient Instructions (Signed)
We will arrange for a CT scan of your chest to better evaluate for any changes that could be associated with recent COVID-19 infection We will perform pulmonary function testing. Okay to begin increasing your exercise routine in order to try to build up stamina Follow with Dr. Lamonte Sakai next available with full pulmonary function testing on the same day.

## 2021-08-09 NOTE — Assessment & Plan Note (Signed)
Continues to have dry cough that became active when she had COVID-19 in mid August.  She denies any GERD, denies any significant PND or allergies.  Not clear to me that empiric therapy of either is indicated at this time.  This could be associated with COVID change, ILD, even evolving airflow obstruction post viral infection.  I think we should start with CT chest and pulmonary function testing.

## 2021-08-10 ENCOUNTER — Ambulatory Visit (INDEPENDENT_AMBULATORY_CARE_PROVIDER_SITE_OTHER): Payer: Medicare Other | Admitting: Pharmacist

## 2021-08-10 DIAGNOSIS — I480 Paroxysmal atrial fibrillation: Secondary | ICD-10-CM

## 2021-08-10 DIAGNOSIS — I10 Essential (primary) hypertension: Secondary | ICD-10-CM

## 2021-08-10 NOTE — Patient Instructions (Signed)
Hi [name] [name],  I just found this protein content in foods handout that may be useful for you. Of course, you'll have to disregard the meat section but it talks about the protein content of lots of other grains/vegetables that may be helpful for you. I will keep looking though!  Let me know if you need anything else from me or have other questions!  Best, Maddie  [name], PharmD, [place] [facility] [place]  Visit Information   Goals Addressed   None    Patient Care Plan: CCM Pharmacy Care Plan     Problem Identified: Problem: Hypertension, Atrial Fibrillation, Anxiety, Osteoporosis and Macular degeneration      Long-Range Goal: Patient-Specific Goal   Start Date: 02/11/2021  Expected End Date: 02/11/2022  Recent Progress: On track  Priority: High  Note:   Current Barriers:  Unable to maintain control of Afib  Pharmacist Clinical Goal(s):  Patient will achieve adherence to monitoring guidelines and medication adherence to achieve therapeutic efficacy through collaboration with PharmD and provider.   Interventions: 1:1 collaboration with [name], [name], MD regarding development and update of comprehensive plan of care as evidenced by provider attestation and co-signature Inter-disciplinary care team collaboration (see longitudinal plan of care) Comprehensive medication review performed; medication list updated in electronic medical record  Hypertension (BP goal <140/90) -Controlled -Current treatment: Diltiazem 300 mg 1 capsule daily Metoprolol tartrate 25 mg 1 tablet twice daily -Medications previously tried: n/a  -Current home readings: 122/62 (HR 66), 167/64 (HR 75), 105/65 (HR 68), 94/63 (HR 68) -Current dietary habits: limits salt intake -Current exercise habits: [employer] in [place] classes and walking  -Denies hypotensive/hypertensive symptoms -Educated on Exercise goal of 150 minutes per week; Importance of home blood  pressure monitoring; -Counseled to monitor BP at home at least weekly, document, and provide log at future appointments -Counseled on diet and exercise extensively Recommended to continue current medication  Atrial Fibrillation (Goal: prevent stroke and major bleeding) -Not ideally controlled -CHADSVASC: 3 -Current treatment: Rate/rhythm control: Tikosyn 250 mcg 1 tablet twice daily; Metoprolol 25 mg twice daily Anticoagulation: Eliquis 5 mg 1 tablet twice daily -Medications previously tried: none -Home BP and HR readings: 70-90s -Counseled on increased risk of stroke due to Afib and benefits of anticoagulation for stroke prevention; -Recommended to continue current medication  Anxiety for flying (Goal: minimize symptoms) -Controlled -Current treatment: Lorazepam 0.5 mg 1 tablet as needed -Medications previously tried/failed: none -GAD7: n/a -Educated on Benefits of medication for symptom control -Recommended to continue current medication  Osteoporosis (Goal prevent fractures) -Controlled -Last DEXA Scan: 08/2020              T-Score femoral neck: LFN -2.3, RFN -2.5             T-Score total hip: n/a             T-Score lumbar spine: -1.9             T-Score forearm radius: n/a             10-year probability of major osteoporotic fracture: n/a             10-year probability of hip fracture: n/a -Patient is a candidate for pharmacologic treatment due to T-Score < -2.5 in femoral neck -Current treatment  Fosamax 70 mg 1 tablet weekly - Saturday Calcium-vitamin D 600 mg- 2500 units daily -Medications previously tried: none  -Recommend [phone] units of vitamin D daily. Recommend 1200 mg of calcium  daily from dietary and supplemental sources. Counseled on oral bisphosphonate administration: take in the morning, 30 minutes prior to food with 6-8 oz of water. Do not lie down for at least 30 minutes after taking. Recommend weight-bearing and muscle strengthening exercises for  building and maintaining bone density. -Recommended to continue current medication  Macular degeneration (Goal: maintain healthy eyes) -Controlled -Current treatment  Preservision areds 1 tablet twice daily Eysuvis 0.25% apply 1 drop three times daily -Medications previously tried: none  -Recommended to continue current medication  Health Maintenance -Vaccine gaps: tetanus (patient reported getting at pharmacy a couple years ago - unable to confirm with [facility]) -Current therapy:  Tylenol 500 mg 1 tablet as needed -Educated on Cost vs benefit of each product must be carefully weighed by individual consumer -Patient is satisfied with current therapy and denies issues -Recommended to continue current medication  Patient Goals/Self-Care Activities Patient will:  - check blood pressure at least weekly, document, and provide at future appointments target a minimum of 150 minutes of moderate intensity exercise weekly  Follow Up Plan: Telephone follow up appointment with care management team member scheduled for: 6 months       Patient verbalizes understanding of instructions provided today and agrees to view in [place].  Telephone follow up appointment with pharmacy team member scheduled for: 6 months  Viona Gilmore, [facility]

## 2021-08-10 NOTE — Progress Notes (Signed)
Chronic Care Management Pharmacy Note  08/10/2021 Name:  [name] [name] MRN:  [id number] DOB:  Oct 05, 1951  Summary: Pt reports she is feeling a little better post COVID BP is mostly at goal < 140/90   Recommendations/Changes made from today's visit: -Recommended switching to potassium chloride 10 mEq tablets for next prescription or dissolving in water to help with swallowing   Plan: Follow up BP assessment in 3 months  Subjective: [name] [name] is an 70 y.o. year old female who is a primary patient of [name], [name], MD.  The CCM team was consulted for assistance with disease management and care coordination needs.    Engaged with patient by telephone for follow up visit in response to provider referral for pharmacy case management and/or care coordination services.   Consent to Services:  The patient was given information about Chronic Care Management services, agreed to services, and gave verbal consent prior to initiation of services.  Please see initial visit note for detailed documentation.   Patient Care Team: [name], [name], MD as PCP - General Minus Breeding, MD as PCP - Cardiology (Cardiology) Thompson Grayer, MD as PCP - Electrophysiology (Cardiology) Clent Jacks, MD (Ophthalmology) Maia Breslow, MD (Orthopedic Surgery) Minus Breeding, MD as Consulting Physician (Cardiology) Viona Gilmore, [facility] as Pharmacist (Pharmacist)  Recent office visits: 08/03/21 Carolann Littler, MD: Patient presented for rib/breast pain. Influenza vaccine administered.  09/29/20 Shanon Ace, MD: Patient presented for a rash. Prescribed triamcinolone and Valtrex.  Recent consult visits: 07/05/21 Baltazar Apo, MD (pulmonary): Patient presented for consult for COVID19 infection. Plan for repeat chest X-ray in 1 month.  07/05/21 Barrington Ellison, PA-C (cardiology): Patient presented for A fib follow up. No medication changes made.  06/13/21 [name], FNP (telehealth): Patient presented for  COVID 19 infection. Prescribed molnupiravir.  06/09/21 Malka So, PA (cardiology): Patient presented for Afib follow up.  05/18/21 Ellouise Newer, PA (gastro): Patient presented for colon cancer screening.  05/04/21 Thompson Grayer, MD (cardiology): Patient presented for Afib follow up.  09/28/20 Philemon Kingdom, MD (endo): Patient presented for osteoporosis follow up. No changes made.    Hospital visits: 8/2-06/03/21 Patient admitted [facility] for Tikosyn initiation.  06/24/21 Patient presented to [facility] emergency department for COVID19 infection and pneumonia.  Objective:  Lab Results  Component Value Date   CREATININE 0.71 07/05/2021   BUN 14 07/05/2021   GFR 84.89 05/17/2018   GFRNONAA >60 06/24/2021   GFRAA 86 09/28/2020   NA 136 07/05/2021   K 4.6 07/05/2021   CALCIUM 9.5 07/05/2021   CO2 23 07/05/2021   GLUCOSE 90 07/05/2021    Lab Results  Component Value Date/Time   GFR 84.89 05/17/2018 09:12 AM   GFR 91.07 09/14/2016 08:05 AM    Last diabetic Eye exam:  Lab Results  Component Value Date/Time   HMDIABEYEEXA Normal done by Dr.Groat 12/07/2011 12:00 AM    Last diabetic Foot exam: No results found for: HMDIABFOOTEX   Lab Results  Component Value Date   CHOL 226 (H) 05/17/2018   HDL 79.30 05/17/2018   LDLCALC 129 (H) 05/17/2018   LDLDIRECT 131.0 09/03/2009   TRIG 84.0 05/17/2018   CHOLHDL 3 05/17/2018    Hepatic Function Latest Ref Rng & Units 06/24/2021 05/17/2018 09/14/2016  Total Protein 6.5 - 8.1 g/dL 6.6 7.4 7.3  Albumin 3.5 - 5.0 g/dL 2.8(L) 4.5 4.5  AST 15 - 41 U/L $Remo'26 21 20  'gQkFo$ ALT 0 - 44 U/L 18 23  15  Alk Phosphatase 38 - 126 U/L 67 37(L) 42  Total Bilirubin 0.3 - 1.2 mg/dL 1.2 0.7 0.8  Bilirubin, Direct 0.0 - 0.3 mg/dL - 0.1 0.1    Lab Results  Component Value Date/Time   TSH 0.891 06/24/2021 12:32 PM   TSH 0.82 09/28/2020 08:22 AM   TSH 0.867 10/06/2019 02:47 PM   FREET4 1.00 12/08/2011 08:05 AM     CBC Latest Ref Rng & Units 06/24/2021 01/10/2021 05/31/2020  WBC 4.0 - 10.5 K/uL 20.5(H) 6.0 6.0  Hemoglobin 12.0 - 15.0 g/dL 20.2 33.4 35.6  Hematocrit 36.0 - 46.0 % 41.3 40.6 41.9  Platelets 150 - 400 K/uL 303 226 231    Lab Results  Component Value Date/Time   VD25OH 44.3 09/28/2020 08:22 AM   VD25OH 57.64 11/20/2019 08:06 AM   VD25OH 43.25 09/23/2018 11:33 AM    Clinical ASCVD: No  The ASCVD Risk score (Arnett DK, et al., 2019) failed to calculate for the following reasons:   Cannot find a previous HDL lab   Cannot find a previous total cholesterol lab    Depression screen [facility] 2/9 09/29/2020 05/17/2018  Decreased Interest 0 0  Down, Depressed, Hopeless 0 0  PHQ - 2 Score 0 0  Some recent data might be hidden     CHA2DS2/VAS Stroke Risk Points  Current as of 4 hours ago     3 >= 2 Points: High Risk  1 - 1.99 Points: Medium Risk  0 Points: Low Risk    Last Change: N/A      Details    This score determines the patient's risk of having a stroke if the  patient has atrial fibrillation.       Points Metrics  0 Has Congestive Heart Failure:  No    Current as of 4 hours ago  0 Has Vascular Disease:  No    Current as of 4 hours ago  1 Has Hypertension:  Yes    Current as of 4 hours ago  1 Age:  17    Current as of 4 hours ago  0 Has Diabetes:  No    Current as of 4 hours ago  0 Had Stroke:  No  Had TIA:  No  Had Thromboembolism:  No    Current as of 4 hours ago  1 Female:  Yes    Current as of 4 hours ago     Social History   Tobacco Use  Smoking Status Never  Smokeless Tobacco Never   BP Readings from Last 3 Encounters:  08/09/21 122/62  08/03/21 100/60  07/05/21 (!) 102/58   Pulse Readings from Last 3 Encounters:  08/09/21 66  08/03/21 70  07/05/21 72   Wt Readings from Last 3 Encounters:  08/09/21 95 lb 9.6 oz (43.4 kg)  08/03/21 97 lb 12.8 oz (44.4 kg)  07/05/21 99 lb 12.8 oz (45.3 kg)   BMI Readings from Last 3 Encounters:  08/09/21 16.41  kg/m  08/03/21 16.79 kg/m  07/05/21 17.13 kg/m    Assessment/Interventions: Review of patient past medical history, allergies, medications, health status, including review of consultants reports, laboratory and other test data, was performed as part of comprehensive evaluation and provision of chronic care management services.   SDOH:  (Social Determinants of Health) assessments and interventions performed: No  SDOH Screenings   Alcohol Screen: Not on file  Depression (PHQ2-9): Low Risk    PHQ-2 Score: 0  Financial Resource Strain: Low Risk  Difficulty of Paying Living Expenses: Not hard at all  Food Insecurity: Not on file  Housing: Not on file  Physical Activity: Not on file  Social Connections: Not on file  Stress: Not on file  Tobacco Use: Low Risk    Smoking Tobacco Use: Never   Smokeless Tobacco Use: Never  Transportation Needs: No Transportation Needs   Lack of Transportation (Medical): No   Lack of Transportation (Non-Medical): No    CCM Care Plan  Allergies  Allergen Reactions   Latex Hives   Other Rash and Other (See Comments)    Adhesive pads left on skin for extended periods = BURNS AND RASHES ON THE SKIN!!!!   Codeine Phosphate Other (See Comments)    Drowsiness    Demerol [Meperidine] Nausea And Vomiting   Protonix [Pantoprazole] Rash   Sulfa Antibiotics Rash   Tens Therapy Replace Back Pads Itching and Rash    Medications Reviewed Today     Reviewed by Collene Gobble, MD (Physician) on 08/09/21 at [id number]  Med List Status: <None>   Medication Order Taking? Sig Documenting Provider Last Dose Status Informant  acetaminophen (TYLENOL) 500 MG tablet [id number] No Take 1,000 mg by mouth every 6 (six) hours as needed for moderate pain. [provider] Taking Active Self  alendronate (FOSAMAX) 70 MG tablet [id number] No TAKE 1 TABLET BY MOUTH ONCE A WEEK WITH A FULL GLASS OF WATER ON AN EMPTY STOMACH Philemon Kingdom, MD Taking Active   apixaban  (ELIQUIS) 5 MG TABS tablet [id number] No TAKE 1 TABLET BY MOUTH  TWICE DAILY Allred, Jeneen Rinks, MD Taking Active Self  Calcium Carb-Cholecalciferol (CALCIUM+D3 PO) [id number] No Take 1 tablet by mouth daily with supper. [provider] Taking Active Self  diltiazem (CARDIZEM) 30 MG tablet [id number] No Take 1 tablet every 4 hours AS NEEDED for breakthrough AFIB [name], [name], PA Taking Active Self  diltiazem (TIAZAC) 300 MG 24 hr capsule [id number] No Take 1 capsule (300 mg total) by mouth daily. Minus Breeding, MD Taking Active Self  dofetilide (TIKOSYN) 250 MCG capsule [id number] No Take 1 capsule (250 mcg total) by mouth 2 (two) times daily. Shirley Friar, PA-C Taking Active   LORazepam (ATIVAN) 0.5 MG tablet [id number] No Take 0.5 mg by mouth daily as needed for anxiety (when flying). [provider] Taking Active Self  metoprolol tartrate (LOPRESSOR) 25 MG tablet [id number] No Take 1 tablet (25 mg total) by mouth 2 (two) times daily. Minus Breeding, MD Taking Active Self  Multiple Vitamins-Minerals (PRESERVISION AREDS PO) [id number] No Take 1 capsule by mouth in the morning and at bedtime. [provider] Taking Active Self  potassium chloride SA (KLOR-CON) 20 MEQ tablet [id number] No Take one tablet by mouth daily [name], [name], PA Taking Active             Patient Active Problem List   Diagnosis Date Noted   Cough 08/09/2021   Pneumonia due to COVID-19 virus 07/05/2021   Secondary hypercoagulable state ([place]) 05/31/2021   Paroxysmal A-fib ([place]) 05/31/2021   Posterior capsular opacification non visually significant of left eye 07/19/2020   Left epiretinal membrane 07/19/2020   Right epiretinal membrane 07/19/2020   Low back pain 07/14/2020   Pain in left leg 07/14/2020   Educated about COVID-19 virus infection 05/30/2020   Chronic pain of right knee 03/31/2019   Essential hypertension 06/30/2017   Age related osteoporosis 10/19/2016   Paroxysmal atrial  fibrillation ([place]) 09/27/2015   Hemorrhage of rectum and  anus 04/16/2014   Special screening for malignant neoplasms, colon 04/16/2014   Hemorrhoids, unspecified hemorrhoid type 04/16/2014   Plantar wart of left foot 05/14/2013   Foot callus 05/14/2013   Bilateral bunions 03/13/2013   Hypertrophic toenail 03/13/2013   Vision disturbance 03/13/2013   Post-nasal drainage 05/05/2012   Aortic insufficiency 01/16/2012   Macular degeneration 10/12/2011   ANXIETY, SITUATIONAL 05/08/2008   Vitamin D insufficiency 05/22/2007   ELEVATED BLOOD PRESSURE WITHOUT DIAGNOSIS OF HYPERTENSION 05/22/2007   Allergic rhinitis due to allergen 03/28/2007    Immunization History  Administered Date(s) Administered   Fluad Quad(high Dose 65+) 09/02/2019, 08/03/2021   Influenza Split 08/25/2011, 09/03/2013, 08/03/2014   Influenza Whole 08/25/2009, 07/30/2010, 07/30/2012   Influenza, High Dose Seasonal PF 07/30/2020   Influenza,inj,Quad PF,6+ Mos 08/17/2015, 07/18/2016, 07/30/2017   Influenza-Unspecified 08/20/2018, 08/09/2019   PFIZER(Purple Top)SARS-COV-2 Vaccination 12/05/2019, 12/30/2019, 08/17/2020, 02/25/2021   Pneumococcal Conjugate-13 03/05/2018   Pneumococcal Polysaccharide-23 02/02/2020   Pneumococcal-Unspecified 12/02/2019   Td 05/22/2007   Zoster Recombinat (Shingrix) 03/22/2018, 07/28/2018   Zoster, Live 10/02/2013   Patient is still recovering from [place]. She notes significant weakness after walking just a short distance. Patient plans to start walking more. Patient retired on September 30th. Patient has been sleeping better and she had a lot on her mind.  Patient reports potassium is harder to swallow. Recommended switching to potassium 10 mEq for next prescription. Discussed dissolving in 4 ounces of water.  Patient is in Afib every couple of weeks and uses the diltiazem for this and only takes 1/2 tablet.  Conditions to be addressed/monitored:  Hypertension, Atrial Fibrillation,  Anxiety, Osteoporosis and Macular degeneration  Conditions addressed this visit: Hypertension, Atrial Fibrillation  Care Plan : CCM Pharmacy Care Plan  Updates made by Viona Gilmore, [place] since 08/10/2021 12:00 AM     Problem: Problem: Hypertension, Atrial Fibrillation, Anxiety, Osteoporosis and Macular degeneration      Long-Range Goal: Patient-Specific Goal   Start Date: 02/11/2021  Expected End Date: 02/11/2022  Recent Progress: On track  Priority: High  Note:   Current Barriers:  Unable to maintain control of Afib  Pharmacist Clinical Goal(s):  Patient will achieve adherence to monitoring guidelines and medication adherence to achieve therapeutic efficacy through collaboration with PharmD and provider.   Interventions: 1:1 collaboration with [name], [name], MD regarding development and update of comprehensive plan of care as evidenced by provider attestation and co-signature Inter-disciplinary care team collaboration (see longitudinal plan of care) Comprehensive medication review performed; medication list updated in electronic medical record  Hypertension (BP goal <140/90) -Controlled -Current treatment: Diltiazem 300 mg 1 capsule daily Metoprolol tartrate 25 mg 1 tablet twice daily -Medications previously tried: n/a  -Current home readings: 122/62 (HR 66), 167/64 (HR 75), 105/65 (HR 68), 94/63 (HR 68) -Current dietary habits: limits salt intake -Current exercise habits: [employer] in [place] classes and walking  -Denies hypotensive/hypertensive symptoms -Educated on Exercise goal of 150 minutes per week; Importance of home blood pressure monitoring; -Counseled to monitor BP at home at least weekly, document, and provide log at future appointments -Counseled on diet and exercise extensively Recommended to continue current medication  Atrial Fibrillation (Goal: prevent stroke and major bleeding) -Not ideally controlled -CHADSVASC: 3 -Current treatment: Rate/rhythm  control: Tikosyn 250 mcg 1 tablet twice daily; Metoprolol 25 mg twice daily Anticoagulation: Eliquis 5 mg 1 tablet twice daily -Medications previously tried: none -Home BP and HR readings: 70-90s -Counseled on increased risk of stroke due to Afib and benefits of anticoagulation  for stroke prevention; -Recommended to continue current medication  Anxiety for flying (Goal: minimize symptoms) -Controlled -Current treatment: Lorazepam 0.5 mg 1 tablet as needed -Medications previously tried/failed: none -GAD7: n/a -Educated on Benefits of medication for symptom control -Recommended to continue current medication  Osteoporosis (Goal prevent fractures) -Controlled -Last DEXA Scan: 08/2020              T-Score femoral neck: LFN -2.3, RFN -2.5             T-Score total hip: n/a             T-Score lumbar spine: -1.9             T-Score forearm radius: n/a             10-year probability of major osteoporotic fracture: n/a             10-year probability of hip fracture: n/a -Patient is a candidate for pharmacologic treatment due to T-Score < -2.5 in femoral neck -Current treatment  Fosamax 70 mg 1 tablet weekly - Saturday Calcium-vitamin D 600 mg- 2500 units daily -Medications previously tried: none  -Recommend [phone] units of vitamin D daily. Recommend 1200 mg of calcium daily from dietary and supplemental sources. Counseled on oral bisphosphonate administration: take in the morning, 30 minutes prior to food with 6-8 oz of water. Do not lie down for at least 30 minutes after taking. Recommend weight-bearing and muscle strengthening exercises for building and maintaining bone density. -Recommended to continue current medication  Macular degeneration (Goal: maintain healthy eyes) -Controlled -Current treatment  Preservision areds 1 tablet twice daily Eysuvis 0.25% apply 1 drop three times daily -Medications previously tried: none  -Recommended to continue current medication  Health  Maintenance -Vaccine gaps: tetanus (patient reported getting at pharmacy a couple years ago - unable to confirm with [facility]) -Current therapy:  Tylenol 500 mg 1 tablet as needed -Educated on Cost vs benefit of each product must be carefully weighed by individual consumer -Patient is satisfied with current therapy and denies issues -Recommended to continue current medication  Patient Goals/Self-Care Activities Patient will:  - check blood pressure at least weekly, document, and provide at future appointments target a minimum of 150 minutes of moderate intensity exercise weekly  Follow Up Plan: Telephone follow up appointment with care management team member scheduled for: 6 months        Medication Assistance: None required.  Patient affirms current coverage meets needs.  Compliance/Adherence/Medication fill history: Care Gaps: Tetanus   Star-Rating Drugs: None  Patient's preferred pharmacy is:  [id number] - [place], [place] - [address] [address] Phone: [phone] Fax: [phone]  [facility] [place] [place] [address] Phone: [phone] Fax: [phone]  [facility]  ([place], [facility] [place] [address] [place] [address] Phone: [phone] Fax: [phone]  Uses pill box? Yes -  weekly  - recommended AM/PM Pt endorses 100% compliance  We discussed: Current pharmacy is preferred with insurance plan and patient is satisfied with pharmacy services Patient decided to: Continue current medication management strategy  Care Plan and Follow Up Patient Decision:  Patient agrees to Care Plan and Follow-up.  Plan: Telephone follow up appointment with care management team member scheduled for:  6 months  [name], PharmD [facility] Clinical Pharmacist [place] at  [place] [phone]

## 2021-08-16 ENCOUNTER — Encounter (INDEPENDENT_AMBULATORY_CARE_PROVIDER_SITE_OTHER): Payer: Medicare Other | Admitting: Ophthalmology

## 2021-08-16 ENCOUNTER — Telehealth: Payer: Self-pay | Admitting: Internal Medicine

## 2021-08-16 DIAGNOSIS — M545 Low back pain, unspecified: Secondary | ICD-10-CM

## 2021-08-16 NOTE — Telephone Encounter (Signed)
Patient fell a few days ago and now her lower back is hurting. It is now difficult for her to walk and bend. Patient is wanting to know if there was anything she could do so that it will not affect her heart.  Patient could be contacted at [phone].  Please advise.

## 2021-08-16 NOTE — Telephone Encounter (Signed)
Advise office visit. May be best to send her to sports medicine low [place] based on the severity of her symptoms.

## 2021-08-17 ENCOUNTER — Ambulatory Visit (INDEPENDENT_AMBULATORY_CARE_PROVIDER_SITE_OTHER): Payer: Medicare Other

## 2021-08-17 ENCOUNTER — Ambulatory Visit: Payer: Medicare Other | Admitting: Sports Medicine

## 2021-08-17 ENCOUNTER — Other Ambulatory Visit: Payer: Self-pay

## 2021-08-17 VITALS — BP 136/70 | HR 79 | Ht 64.0 in

## 2021-08-17 DIAGNOSIS — M545 Low back pain, unspecified: Secondary | ICD-10-CM

## 2021-08-17 DIAGNOSIS — M5440 Lumbago with sciatica, unspecified side: Secondary | ICD-10-CM | POA: Diagnosis not present

## 2021-08-17 DIAGNOSIS — M5442 Lumbago with sciatica, left side: Secondary | ICD-10-CM

## 2021-08-17 DIAGNOSIS — S39012A Strain of muscle, fascia and tendon of lower back, initial encounter: Secondary | ICD-10-CM | POA: Diagnosis not present

## 2021-08-17 MED ORDER — MELOXICAM 7.5 MG PO TABS: 7.5000 mg | ORAL_TABLET | Freq: Every day | ORAL | 0 refills | Status: DC

## 2021-08-17 NOTE — Patient Instructions (Addendum)
Good to see you  X ray on way out Meloxicam 7.5mg  daily for one week See me again in 2 weeks if no improvement

## 2021-08-17 NOTE — Telephone Encounter (Signed)
I spoke with Gareth Eagle from [place] sports [facility] and she stated that she will contact the pt directly to schedule an appointment for today with Dr. Glennon Mac. Urgent referral to sprts medicine has been placed per PCP.

## 2021-08-17 NOTE — Telephone Encounter (Signed)
Pt is calling and would like an appt today with dr Regis Bill for back pain. There is a Therapist, music

## 2021-08-17 NOTE — Addendum Note (Signed)
Addended by: Nilda Riggs on: 08/17/2021 09:32 AM   Modules accepted: Orders

## 2021-08-17 NOTE — Progress Notes (Signed)
[name] [name] D.[place] Phone: [phone]   Assessment and Plan:     1. Acute bilateral low back pain, left sciatica present 2. Lumbar strain, initial encounter -Acute, self-limited, initial sports medicine visit - Likely acute strain of left lumbar paraspinal muscles into left gluteal muscles with spasming causing pain and radicular sciatica symptoms - Start meloxicam 7.5 mg daily x7 days then discontinue.  Educated that patient should not use NSAIDs for prolonged periods of time due to anticoagulation with Eliquis.  Because of this we will use a short course of low-dose meloxicam and then completely discontinue - Offered muscle relaxer to help with muscle spasms, however patient is concerned of side effects with drowsiness and dizziness so declined at this visit - X-ray obtained in clinic.  My interpretation: No acute fracture.  Mild curve of lumbar spine similar in appearance to x-ray from 07/14/2020.  Disc height maintained.   Pertinent previous records reviewed include PCP note, telephone encounter PCP, lumbar x-ray from 07/14/2020   Follow Up: In 2 weeks if no improvement or worsening of symptoms.   Subjective:   I, [name] [name], am serving as a scribe for Dr. Glennon [name]  Chief Complaint: Left lower back pain   HPI:   08/17/21 Patient is a 70 year old female presenting with left lower back pain that started 08/14/21 that started while sitting in a chair on the computer. Patient locates pain to right above left buttock, with pain that radiates down left leg to the shin. The pain is worse with walking and can be relieved by sitting on a heavily padded chair or sofa and bending over. Patient has also used heat and tylenol to help with pain but that has had little to no results. Patient has states that she started to wear more supportive shoes to see if that would help, but it has not. Patient states  that she has lost weight and strength since having COVID and pneumonia and she has not regained that strength yet.   Numbness/tingling: no Weakness: no     Relevant Historical Information: A. fib on anticoagulation  Additional pertinent review of systems negative.   Current Outpatient Medications:    acetaminophen (TYLENOL) 500 MG tablet, Take 1,000 mg by mouth every 6 (six) hours as needed for moderate pain., Disp: , Rfl:    alendronate (FOSAMAX) 70 MG tablet, TAKE 1 TABLET BY MOUTH ONCE A WEEK WITH A FULL GLASS OF WATER ON AN EMPTY STOMACH, Disp: 12 tablet, Rfl: 3   apixaban (ELIQUIS) 5 MG TABS tablet, TAKE 1 TABLET BY MOUTH  TWICE DAILY, Disp: 180 tablet, Rfl: 1   Calcium Carb-Cholecalciferol (CALCIUM+D3 PO), Take 1 tablet by mouth daily with supper., Disp: , Rfl:    diltiazem (CARDIZEM) 30 MG tablet, Take 1 tablet every 4 hours AS NEEDED for breakthrough AFIB, Disp: 45 tablet, Rfl: 1   diltiazem (TIAZAC) 300 MG 24 hr capsule, Take 1 capsule (300 mg total) by mouth daily., Disp: 90 capsule, Rfl: 3   dofetilide (TIKOSYN) 250 MCG capsule, Take 1 capsule (250 mcg total) by mouth 2 (two) times daily., Disp: 180 capsule, Rfl: 3   LORazepam (ATIVAN) 0.5 MG tablet, Take 0.5 mg by mouth daily as needed for anxiety (when flying)., Disp: , Rfl:    meloxicam (MOBIC) 7.5 MG tablet, Take 1 tablet (7.5 mg total) by mouth daily., Disp: 7 tablet, Rfl: 0   metoprolol tartrate (LOPRESSOR) 25 MG  tablet, Take 1 tablet (25 mg total) by mouth 2 (two) times daily., Disp: 180 tablet, Rfl: 3   Multiple Vitamins-Minerals (PRESERVISION AREDS PO), Take 1 capsule by mouth in the morning and at bedtime., Disp: , Rfl:    potassium chloride SA (KLOR-CON) 20 MEQ tablet, Take one tablet by mouth daily, Disp: 30 tablet, Rfl: 0   Objective:     Vitals:   08/17/21 1027  BP: 136/70  Pulse: 79  SpO2: 98%  Height: 5\' 4"  (1.626 m)      Body mass index is 16.41 kg/m.    Physical Exam:    Gen: Appears well, nad,  nontoxic and pleasant Psych: Alert and oriented, appropriate mood and affect Neuro: sensation intact, strength is 5/5 in upper and lower extremities, muscle tone wnl Skin: no susupicious lesions or rashes  Back - Normal skin, Spine with normal alignment and no deformity.   No tenderness to vertebral process palpation.   Left-sided lumbar paraspinal muscles are mildly tender to palpation without spasm Straight leg raise negative Worsened low back and gluteal pain with active extension Negative piriformis test  Gait: Slow shuffling steps   Electronically signed by:  [name] [name] D.[facility] 11:07 AM 08/17/21

## 2021-08-18 ENCOUNTER — Encounter: Payer: Self-pay | Admitting: Sports Medicine

## 2021-08-18 ENCOUNTER — Telehealth: Payer: Self-pay | Admitting: Pharmacist

## 2021-08-18 MED ORDER — CYCLOBENZAPRINE HCL 5 MG PO TABS
5.0000 mg | ORAL_TABLET | Freq: Three times a day (TID) | ORAL | 0 refills | Status: DC | PRN
Start: 2021-08-18 — End: 2021-09-05

## 2021-08-18 MED ORDER — METHOCARBAMOL 500 MG PO TABS: 500.0000 mg | ORAL_TABLET | Freq: Four times a day (QID) | ORAL | 0 refills | Status: DC

## 2021-08-18 NOTE — Telephone Encounter (Signed)
No, there should be no interactions between Robaxin and Tikosyn or any of her other cardiac medications

## 2021-08-18 NOTE — Telephone Encounter (Signed)
Called patient and informed her of doctors message and helped calm her down about taking the muscle relaxer. If patient had any other questions to give [place] a call.

## 2021-08-18 NOTE — Telephone Encounter (Signed)
Hi Dr. Glennon Mac, I'm clinical pharmacist for Dr. Percival Spanish at [facility]. Patient called this morning to check if there were any interactions between new medications and her current cardiac meds. Patient is on Tikosyn and cyclobenzaprine can prolong QT interval.  Would you consider a different muscle relaxer without this interaction?

## 2021-08-18 NOTE — Telephone Encounter (Signed)
Call patient: [phone]

## 2021-08-18 NOTE — Telephone Encounter (Signed)
Patient called back to ask about medications.  Advised there should be no interactions between methocarbamol and her cardiac medications.  Advised that there is an increased risk of bleeding with meloxicam and Eliquis, however it is a low dose for a short period of time and because of her pain level benefit likely outweighs risk at this point.  Recommended she watch for s/sx of bleeding.  Also advised that taking Tylenol was ok since all three pain medications have different mechanisms of action.

## 2021-08-19 ENCOUNTER — Inpatient Hospital Stay: Admission: RE | Admit: 2021-08-19 | Payer: Medicare Other | Source: Ambulatory Visit

## 2021-08-19 NOTE — Telephone Encounter (Signed)
Noted  

## 2021-08-23 ENCOUNTER — Encounter (HOSPITAL_BASED_OUTPATIENT_CLINIC_OR_DEPARTMENT_OTHER): Payer: Self-pay

## 2021-08-23 ENCOUNTER — Other Ambulatory Visit (HOSPITAL_COMMUNITY): Payer: Self-pay

## 2021-08-23 ENCOUNTER — Other Ambulatory Visit (HOSPITAL_COMMUNITY): Payer: Self-pay | Admitting: *Deleted

## 2021-08-23 MED ORDER — POTASSIUM CHLORIDE CRYS ER 10 MEQ PO TBCR: 20.0000 meq | EXTENDED_RELEASE_TABLET | Freq: Every day | ORAL | 3 refills | Status: DC

## 2021-08-23 NOTE — Addendum Note (Signed)
Addended by: Cristopher Estimable on: 08/23/2021 05:15 PM   Modules accepted: Orders

## 2021-08-24 ENCOUNTER — Other Ambulatory Visit (HOSPITAL_COMMUNITY): Payer: Self-pay

## 2021-08-25 ENCOUNTER — Ambulatory Visit (INDEPENDENT_AMBULATORY_CARE_PROVIDER_SITE_OTHER)
Admission: RE | Admit: 2021-08-25 | Discharge: 2021-08-25 | Disposition: A | Discharge status: Home / Self Care | Payer: Medicare Other | Source: Ambulatory Visit | Attending: Emergency Medicine | Admitting: Emergency Medicine

## 2021-08-25 ENCOUNTER — Other Ambulatory Visit: Payer: Self-pay

## 2021-08-25 DIAGNOSIS — K753 Granulomatous hepatitis, not elsewhere classified: Secondary | ICD-10-CM | POA: Diagnosis not present

## 2021-08-25 DIAGNOSIS — I358 Other nonrheumatic aortic valve disorders: Secondary | ICD-10-CM | POA: Diagnosis not present

## 2021-08-25 DIAGNOSIS — J849 Interstitial pulmonary disease, unspecified: Secondary | ICD-10-CM

## 2021-08-25 DIAGNOSIS — I2510 Atherosclerotic heart disease of native coronary artery without angina pectoris: Secondary | ICD-10-CM | POA: Diagnosis not present

## 2021-08-25 DIAGNOSIS — J479 Bronchiectasis, uncomplicated: Secondary | ICD-10-CM | POA: Diagnosis not present

## 2021-08-26 ENCOUNTER — Telehealth: Payer: Self-pay | Admitting: Cardiology

## 2021-08-26 NOTE — Telephone Encounter (Signed)
   Pt said Dr. Lamonte Sakai ordered CT of her chest and it was done yesterday, she would like for Dr. Warren Lacy to review it and get his insight if there's an issue related to her heart

## 2021-08-26 NOTE — Telephone Encounter (Signed)
Called informed patient . Will forward request. Patient aware Dr Percival Spanish not in the office.

## 2021-08-29 DIAGNOSIS — I10 Essential (primary) hypertension: Secondary | ICD-10-CM

## 2021-08-29 DIAGNOSIS — I480 Paroxysmal atrial fibrillation: Secondary | ICD-10-CM

## 2021-08-30 ENCOUNTER — Telehealth: Payer: Self-pay | Admitting: Emergency Medicine

## 2021-08-30 ENCOUNTER — Ambulatory Visit: Payer: Medicare Other | Admitting: Emergency Medicine

## 2021-08-30 ENCOUNTER — Other Ambulatory Visit: Payer: Self-pay

## 2021-08-30 ENCOUNTER — Encounter: Payer: Self-pay | Admitting: Emergency Medicine

## 2021-08-30 VITALS — BP 118/68 | HR 71 | Temp 97.6°F | Ht 64.0 in | Wt 95.0 lb

## 2021-08-30 DIAGNOSIS — R9389 Abnormal findings on diagnostic imaging of other specified body structures: Secondary | ICD-10-CM | POA: Insufficient documentation

## 2021-08-30 DIAGNOSIS — R053 Chronic cough: Secondary | ICD-10-CM

## 2021-08-30 DIAGNOSIS — J479 Bronchiectasis, uncomplicated: Secondary | ICD-10-CM | POA: Insufficient documentation

## 2021-08-30 NOTE — H&P (View-Only) (Signed)
Subjective:    Patient ID: [name] [name], female    DOB: Sep 01, 1951, 70 y.o.   MRN: [id number]  HPI  70 year old never smoker with history of paroxysmal atrial fibrillation post ablation (Dr. Rayann Heman) and recently started on Tikosyn, hypertension, aortic insufficiency, diverticular disease, allergic rhinitis. She was diagnosed with COVID-19 06/12/2021 with symptoms consisting of sore throat and some URI sx.  She was ordered mulnupiravir on 8/15 but couldn't take it do to possible drug interactions.  She was in the emergency department 06/24/2021 with cough, poor appetite and poor p.o. intake, fatigue, some sputum production, confirmed COVID positive.  Chest X revealed bilateral patchy areas of interstitial prominence right greater than left.  She was treated empirically with antibiotics for possible superimposed bacterial pneumonia.  Today she reports that she is improved some, has a bit better energy but not back to her prior baseline. Her URI sx are better, still has a bit of cough, some clear mucous. Sore throat better. Kept her sense of taste and smell. She did lose some wt - about 10 lbs.   ROV 08/09/21 --follow-up visit for 70 year old woman with A. fib, hypertension, AI, allergic rhinitis.  I saw her 1 month ago after she was diagnosed with COVID-19 in mid August.  She had patchy bilateral infiltrates on her chest x-ray.  She returns today reporting that she is continuing to have cough, dry. She has some L upper rib pain extending anteriorly under L breast > improved. Had been associated w cough to some degree. Rib films have been ordered buy t they couldn't be done here. Denies any GERD or PND, no allergies.   Chest x-ray from today reviewed by me shows some clearing of interstitial prominence noted on her film from 06/24/2021.  Persistent hyperinflation and large lung volumes.   ROV 08/30/21 --follow-up visit 70 year old never smoker with paroxysmal A. fib (ablated), hypertension, allergic  rhinitis.  I saw her for persistent cough that started after she had COVID-19 in mid August.  Has been nonproductive.  She has not had any GERD or PND symptoms, no other clear exacerbating factors.  We ordered pulmonary function testing and CT chest as below.  The PFT have not been done. She is still coughing, no fevers or sweats, some clear phlegm. Has lost some wt overt the last 3 months (on a new plan based diet), 15 lbs. She has had some back pain.   High-resolution CT scan of the chest 08/25/2021 reviewed by me, showed no evidence of ILD, widespread cylindrical bronchiectasis and peripheral bronchiolectasis with some peribronchovascular micro micro nodularity suggestive of atypical mycobacterial disease     Review of Systems As per HPI       Objective:   Physical Exam Vitals:   08/30/21 1006  BP: 118/68  Pulse: 71  Temp: 97.6 F (36.4 C)  TempSrc: Oral  SpO2: 98%  Weight: 95 lb (43.1 kg)  Height: 5\' 4"  (1.626 m)    Gen: Pleasant, thin, in no distress,  normal affect  ENT: No lesions,  mouth clear,  oropharynx clear, no postnasal drip  Neck: No JVD, no stridor  Lungs: No use of accessory muscles, no crackles or wheezing on normal respiration, no wheeze on forced expiration  Cardiovascular: RRR, heart sounds normal, no murmur or gallops, no peripheral edema  Musculoskeletal: No deformities, no cyanosis or clubbing  Neuro: alert, awake, non focal  Skin: Warm, no lesions or rash      Assessment & Plan:   Cough Still  has cough although better.  Minimal sputum production although she does sometimes clear some clear mucus that she feels relates to some mild PND.  Unclear whether it is coming from deep in the chest.  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  No GERD or PND so I suspect the cough relates to a possible atypical infection  Abnormal CT of the chest Bilateral tree-in-bud findings with  associated cylindrical bronchiectasis, very suggestive of atypical mycobacterial disease.  In retrospect she had some bronchiectatic change on her cardiac CT done March 2022.  I have not seen tree-in-bud bronchiolitis following COVID-19 infection, I do not believe this is characteristic of that disease.  Its possible that opportunistic mycobacterial disease was potentiated by COVID-19 infection, relative immunosuppression.  Or possibly her cough was initiated by COVID and is being sustained by chronic MAIC.  We talked about getting a sputum culture for AFB but since she is not producing much sputum I think bronchoscopy would be preferred.  She agrees.  We will work on setting this up  Time spent 41 minutes  Baltazar Apo, MD, PhD 08/30/2021, 11:38 AM [facility] [phone] or if no answer before 7:00PM call [phone] For any issues after 7:00PM please call eLink [phone]

## 2021-08-30 NOTE — Assessment & Plan Note (Addendum)
Still has cough although better.  Minimal sputum production although she does sometimes clear some clear mucus that she feels relates to some mild PND.  Unclear whether it is coming from deep in the chest.  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  No GERD or PND so I suspect the cough relates to a possible atypical infection

## 2021-08-30 NOTE — Telephone Encounter (Signed)
Called and spoke with patient. Let them know their Bronch is scheduled for 09/05/2021 with Dr. Lamonte Sakai at 1:30.  Patient was instructed to arrive at hospital at 1:30 to get covid test done before procedure. They were instructed to bring someone with them as they will not be able to drive home from procedure. Patient instructed not to have anything to eat or drink after midnight. Patient needs to hold their blood thinner Eliquis , 2 days prior to procedure.     Patient voiced understanding, nothing further needed  Routing to Dr.Byrum as FYI

## 2021-08-30 NOTE — Telephone Encounter (Signed)
Per Magda Paganini, this should be scheduled by the procedure pool:  Please schedule the following: Provider performing procedure:R Byrum Diagnosis: Abnormal CT, cough Which side if for nodule / mass? Bilateral Procedure: Bronchoscopy under conscious sedation Has patient been spoken to by Provider and given informed consent? Yes Anesthesia: Conscious sedation Do you need Fluro? No Duration of procedure: 20 minutes Date: 11/8 thru 11/11 Alternate Date:  Time: First care (7:30) Location: [facility] Does patient have OSA? no DM? no Or Latex allergy? yes Medication Restriction: none Anticoagulate/Antiplatelet: none Pre-op Labs Ordered:determined by Anesthesia Imaging request: none  (If, SuperDimension CT Chest, please have STAT courier sent to ENDO) Please coordinate Pre-op COVID Testing

## 2021-08-30 NOTE — Telephone Encounter (Signed)
Dr. Lamonte Sakai, please see mychart message sent by pt and advise: [name] [name] (supporting Lamonte Sakai, Rose Fillers, MD) 1 hour ago (2:02 PM)   Thank you for seeing me today, Dr. Lamonte Sakai.  1. Please remind me what Mac/Mack stands for. 2. Is there any harm in me flying to [place] to see family from Wednesday through Sunday? Anything that will hurt me or make it worse? 3. Is there anything that I can do in the meantime to improve my situation? It looks like my bronchoscopy will be scheduled for Wednesday, Nov. 9. But I will wait to hear from the hospital on details.   Thank you.   [name] [name]

## 2021-08-30 NOTE — Telephone Encounter (Signed)
I'll work on changing it and have endo contact you - thanks.

## 2021-08-30 NOTE — Telephone Encounter (Signed)
Called to get Bronch scheduled for patient and was able to get her in on 09/07/21 at 8:30 at [facility] and this was the only slot open.   Dr. Lamonte Sakai are you able to do this?

## 2021-08-30 NOTE — Patient Instructions (Addendum)
We reviewed her CT scan of the chest today. We will arrange for bronchoscopy to better evaluate your lung inflammation. Follow Dr. Lamonte Sakai in 2 months or next available

## 2021-08-30 NOTE — Progress Notes (Signed)
Subjective:    Patient ID: [name], female    DOB: 19-Apr-1951, 70 y.o.   MRN: [id number]  HPI  70 year old never smoker with history of paroxysmal atrial fibrillation post ablation (Dr. Rayann Heman) and recently started on Tikosyn, hypertension, aortic insufficiency, diverticular disease, allergic rhinitis. She was diagnosed with COVID-19 06/12/2021 with symptoms consisting of sore throat and some URI sx.  She was ordered mulnupiravir on 8/15 but couldn't take it do to possible drug interactions.  She was in the emergency department 06/24/2021 with cough, poor appetite and poor p.o. intake, fatigue, some sputum production, confirmed COVID positive.  Chest X revealed bilateral patchy areas of interstitial prominence right greater than left.  She was treated empirically with antibiotics for possible superimposed bacterial pneumonia.  Today she reports that she is improved some, has a bit better energy but not back to her prior baseline. Her URI sx are better, still has a bit of cough, some clear mucous. Sore throat better. Kept her sense of taste and smell. She did lose some wt - about 10 lbs.   ROV 08/09/21 --follow-up visit for 70 year old woman with A. fib, hypertension, AI, allergic rhinitis.  I saw her 1 month ago after she was diagnosed with COVID-19 in mid August.  She had patchy bilateral infiltrates on her chest x-ray.  She returns today reporting that she is continuing to have cough, dry. She has some L upper rib pain extending anteriorly under L breast > improved. Had been associated w cough to some degree. Rib films have been ordered buy t they couldn't be done here. Denies any GERD or PND, no allergies.   Chest x-ray from today reviewed by me shows some clearing of interstitial prominence noted on her film from 06/24/2021.  Persistent hyperinflation and large lung volumes.   ROV 08/30/21 --follow-up visit 70 year old never smoker with paroxysmal A. fib (ablated), hypertension, allergic  rhinitis.  I saw her for persistent cough that started after she had COVID-19 in mid August.  Has been nonproductive.  She has not had any GERD or PND symptoms, no other clear exacerbating factors.  We ordered pulmonary function testing and CT chest as below.  The PFT have not been done. She is still coughing, no fevers or sweats, some clear phlegm. Has lost some wt overt the last 3 months (on a new plan based diet), 15 lbs. She has had some back pain.   High-resolution CT scan of the chest 08/25/2021 reviewed by me, showed no evidence of ILD, widespread cylindrical bronchiectasis and peripheral bronchiolectasis with some peribronchovascular micro micro nodularity suggestive of atypical mycobacterial disease     Review of Systems As per HPI       Objective:   Physical Exam Vitals:   08/30/21 1006  BP: 118/68  Pulse: 71  Temp: 97.6 F (36.4 C)  TempSrc: Oral  SpO2: 98%  Weight: 95 lb (43.1 kg)  Height: 5\' 4"  (1.626 m)    Gen: Pleasant, thin, in no distress,  normal affect  ENT: No lesions,  mouth clear,  oropharynx clear, no postnasal drip  Neck: No JVD, no stridor  Lungs: No use of accessory muscles, no crackles or wheezing on normal respiration, no wheeze on forced expiration  Cardiovascular: RRR, heart sounds normal, no murmur or gallops, no peripheral edema  Musculoskeletal: No deformities, no cyanosis or clubbing  Neuro: alert, awake, non focal  Skin: Warm, no lesions or rash      Assessment & Plan:   Cough Still  has cough although better.  Minimal sputum production although she does sometimes clear some clear mucus that she feels relates to some mild PND.  Unclear whether it is coming from deep in the chest.  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  No GERD or PND so I suspect the cough relates to a possible atypical infection  Abnormal CT of the chest Bilateral tree-in-bud findings with  associated cylindrical bronchiectasis, very suggestive of atypical mycobacterial disease.  In retrospect she had some bronchiectatic change on her cardiac CT done March 2022.  I have not seen tree-in-bud bronchiolitis following COVID-19 infection, I do not believe this is characteristic of that disease.  Its possible that opportunistic mycobacterial disease was potentiated by COVID-19 infection, relative immunosuppression.  Or possibly her cough was initiated by COVID and is being sustained by chronic MAIC.  We talked about getting a sputum culture for AFB but since she is not producing much sputum I think bronchoscopy would be preferred.  She agrees.  We will work on setting this up  Time spent 41 minutes  Baltazar Apo, MD, PhD 08/30/2021, 11:38 AM [facility] [phone] or if no answer before 7:00PM call [phone] For any issues after 7:00PM please call eLink [phone]

## 2021-08-30 NOTE — Assessment & Plan Note (Signed)
Bilateral tree-in-bud findings with associated cylindrical bronchiectasis, very suggestive of atypical mycobacterial disease.  In retrospect she had some bronchiectatic change on her cardiac CT done March 2022.  I have not seen tree-in-bud bronchiolitis following COVID-19 infection, I do not believe this is characteristic of that disease.  Its possible that opportunistic mycobacterial disease was potentiated by COVID-19 infection, relative immunosuppression.  Or possibly her cough was initiated by COVID and is being sustained by chronic MAIC.  We talked about getting a sputum culture for AFB but since she is not producing much sputum I think bronchoscopy would be preferred.  She agrees.  We will work on setting this up

## 2021-08-31 ENCOUNTER — Telehealth: Payer: Self-pay | Admitting: Emergency Medicine

## 2021-08-31 ENCOUNTER — Telehealth: Payer: Self-pay | Admitting: Cardiology

## 2021-08-31 NOTE — Telephone Encounter (Signed)
   [facility] Pre-operative Risk Assessment    Request for surgical clearance:  What type of surgery is being performed? bronchoscopy   When is this surgery scheduled? 09/05/21   What type of clearance is required (medical clearance vs. Pharmacy clearance to hold med vs. Both)? Medical   Are there any medications that need to be held prior to surgery and how long? apixaban (ELIQUIS) 5 MG TABS tablet ;2 days prior too   Practice name and name of physician performing surgery? Dr Lamonte Sakai   What is your office phone number [phone]    7.   What is your office fax number [phone]  8.   Anesthesia type (None, local, MAC, general) ? Sedation    [name] [name] 08/31/2021, 12:04 PM  _________________________________________________________________   (provider comments below)

## 2021-08-31 NOTE — Telephone Encounter (Signed)
Call made to patient, confirmed DOB. She states she is very concerned about stopping her Eliquis prior to the Bronch. She states her cardiologist told her that we need to provide surgical clearance form prior to procedure. Patient requests that we call her cardiologist.   Call made to Dr Percival Spanish office [facility] on Nurse line/receptionist.

## 2021-08-31 NOTE — Telephone Encounter (Signed)
Collene Gobble, MD to [name] [name]     1:18 PM Hi [name] [name], because we are planning to perform bronchial washings, but should not need to do any kind of biopsies, I would be fine with you continuing your Eliquis if that would ease your concerns about stroke risk. You do not need to stop it in prep for the procedure.  I also do not have any concerns about you travelling if you would like to do so.    Thanks, Dr Noelle Penner, MD to [name], RN  Collene Gobble, MD     8:58 AM Usually holding the Eliquis for two days is fine.  (Sounds like he is suggesting hold 6 doses.  If that is what they prefer that would be fine.)  No problem having the procedure.  Looks like our office already checked on the dietician.  Sorry.    Nothing further needed at this time

## 2021-08-31 NOTE — Telephone Encounter (Signed)
I am fine with writing a note that says she has cough with inflammation on Ct chest, is going to have further workup and should not travel.   You can also tell her that MAIC > mycobacterium avium

## 2021-08-31 NOTE — Telephone Encounter (Signed)
Spoke to patient .  Informed patient of Dr Percival Spanish  reply.  Patient   wanted know what to do - she has been losing weight - she states she was told be be on plant based diet   she was @ 112 lb  and now @95  lbs.   Patient states she has loosely been following a plant base diet. She think she need to talk to a dietician or nutritionist.   RN suggested to patient to eat 3 oz  lean meat - chicken , [place]  salmon. Try dietary supplement drinks -  ensure , boost , carnation of breakfast.   NUts or beans.  Try  to incorporate in diet -  talk with Dr Percival Spanish  at upcoming diet to see if any weight gain- may suggest  health coach or nutritionist  Patient verbalized understadning.

## 2021-09-01 ENCOUNTER — Encounter (HOSPITAL_COMMUNITY): Payer: Self-pay | Admitting: Emergency Medicine

## 2021-09-01 ENCOUNTER — Other Ambulatory Visit: Payer: Self-pay

## 2021-09-01 NOTE — Telephone Encounter (Signed)
Patient with diagnosis of A fib on Eliquis for anticoagulation.    Procedure: bronchoscopy Date of procedure: 09/05/21  CHA2DS2-VASc Score = 4  This indicates a 4.8% annual risk of stroke. The patient's score is based upon: CHF History: 0 HTN History: 1 Diabetes History: 0 Stroke History: 0 Vascular Disease History: 1 Age Score: 1 Gender Score: 1    CrCl 52 mL/min Platelet count 303K  Per office protocol, patient can hold Eliquis for 2 days prior to procedure.   [name] [name]

## 2021-09-01 NOTE — Telephone Encounter (Signed)
Letter has been typed up and patient made aware. Placed up front for pick up. Nothing further needed at this time.

## 2021-09-01 NOTE — Progress Notes (Signed)
Spoke with pt for pre-op call. Pt has hx of A-fib and is on Eliquis. See Dr. Agustina Caroli note (under [facility] Cook's name) that states pt does not have to hold her Eliquis if she is very concerned about stroke risk. Pt states she is NOT going to stop her Eliquis. Pt also states that Dr. Agustina Caroli nurse told her she could have a light breakfast day of procedure (it's to start at 3:15 PM) like cereal with milk and a banana. I did give her ERAS instructions of clear liquids until 12:15 PM. She voiced understanding.   Covid test to be done tomorrow, 09/02/21. She voices understanding of wearing a mask when in public after getting the Covid test done.

## 2021-09-02 ENCOUNTER — Encounter (HOSPITAL_COMMUNITY): Payer: Self-pay | Admitting: Vascular Surgery

## 2021-09-02 ENCOUNTER — Other Ambulatory Visit: Payer: Self-pay | Admitting: Emergency Medicine

## 2021-09-02 LAB — SARS CORONAVIRUS 2 (TAT 6-24 HRS): SARS Coronavirus 2: NEGATIVE

## 2021-09-02 NOTE — Progress Notes (Signed)
Anesthesia Chart Review: SAME DAY WORK-UP   Case: [id number] Date/Time: 09/05/21 1500   Procedure: VIDEO BRONCHOSCOPY WITHOUT FLUORO   Anesthesia type: Moderate Sedation   Pre-op diagnosis: abnormal ct, cough   [facility] / [facility]   Surgeons: Collene Gobble, MD       DISCUSSION: Patient is a 70 year old female scheduled for the above procedure. She tested positive for COVID-19 06/12/21 and had persistent cough. ED visit 06/24/21 showed leukocytosis (20K) and bilateral opacities, likely multilobular pneumonia and was prescribed Augmentin. She was referred to pulmonology. Follow-up CXR showed increased opacities which could represent multifocal PNA and/or scarring. CT recommended and done on 08/25/21. Findings did not suggest ILD, but bronchiectasis and appearance of lung "strongly suggestive of a chronic indolent atypical infectious process such as MAI (mycobacterium avium intracellulare)." She was wasn't producing much sputum, bronchoscopy for specimen/cultures recommended for diagnosis.   History includes never smoker, postoperative N/V, aortic insufficiency (mod-severe 03/2021), PAF (s/p ablation 02/01/21; admitted 05/2021 for Tikosyn initiation), HTN, COVID-19 (8/14//22, 06/24/21).   Although cardiology gave permission to hold Eliquis, patient had some reservations about holding it given her history and stroke risk. She communicated this to Dr. Lamonte Sakai who felt that since biopsies were not planned that she could continue her Eliquis if she desired. She decided not to hold Eliquis. She also reported that due to surgery being scheduled after 3:00 PM, Dr. Lamonte Sakai gave permission for light breakfast. ERAS instructions given by PAT RN.  Anesthesia team to evaluate on the day of surgery.    VS:  BP Readings from Last 3 Encounters:  08/30/21 118/68  08/17/21 136/70  08/09/21 122/62   Pulse Readings from Last 3 Encounters:  08/30/21 71  08/17/21 79  08/09/21 66      PROVIDERS: [name], [name], MD is PCP  Minus Breeding, MD is cardiologist. Last visit 04/22/21.  Thompson Grayer, MD is EP cardiologist. Last visit 06/09/21 with Malka So, PA.    LABS: For day of procedure as indicated. As of 07/05/21, Cr 0.72, glucose 90. WBC 20.5 (in setting of PNA), H/H 13.8/41.3, PLT 303 on 06/24/21.    IMAGES: CT Chest High Resolution 08/25/21: IMPRESSION: 1. No findings to suggest interstitial lung disease. 2. The appearance of the lungs is strongly suggestive of a chronic indolent atypical infectious process such as MAI (mycobacterium avium intracellulare), as above. 3. Aortic atherosclerosis, in addition to 2 vessel coronary artery disease. Please note that although the presence of coronary artery calcium documents the presence of coronary artery disease, the severity of this disease and any potential stenosis cannot be assessed on this non-gated CT examination. Assessment for potential risk factor modification, dietary therapy or pharmacologic therapy may be warranted, if clinically indicated. 4. Ectasia of ascending thoracic aorta (4.3 cm in diameter). Recommend annual imaging followup by CTA or MRA. This recommendation follows 2010 ACCF/AHA/AATS/ACR/ASA/SCA/SCAI/SIR/STS/SVM Guidelines for the Diagnosis and Management of Patients with Thoracic Aortic Disease. Circulation. 2010; 121: A768-T157. Aortic aneurysm NOS (ICD10-I71.9). 5. There are mild calcifications of the aortic valve. Echocardiographic correlation for evaluation of potential valvular dysfunction may be warranted if clinically indicated. 6. Old granulomatous disease in the abdomen. Aortic Atherosclerosis (ICD10-I70.0).    EKG: 07/05/21: NSR   CV: Echo 04/28/21: IMPRESSIONS   1. Left ventricular ejection fraction, by estimation, is 50 to 55%. The  left ventricle has low normal function. The left ventricle has no regional  wall motion abnormalities. Left ventricular diastolic parameters  are  indeterminate.  2. Right ventricular systolic function is normal. The right ventricular  size is normal. There is normal pulmonary artery systolic pressure. The  estimated right ventricular systolic pressure is 43.1 mmHg.   3. The mitral valve is normal in structure. Trivial mitral valve  regurgitation. No evidence of mitral stenosis.   4. The aortic valve is grossly normal. There is mild calcification of the  aortic valve. Aortic valve regurgitation is moderate to severe. No aortic  stenosis is present.   5. The inferior vena cava is normal in size with greater than 50%  respiratory variability, suggesting right atrial pressure of 3 mmHg.  - Conclusion(s)/Recommendation(s): Jet of aortic valve regurgitation is  eccentric and broad based, and may be underestimated by visual assessment.  Consider cardiac MRI for quantitation of aortic valve regurgitation if  clinically indicated.    [facility] 01/25/21: IMPRESSION: 1.  Coronary calcium score 231 which is 86 th percentile for age/sex 2.  Mild bi atrial enlargement no LAA thrombus 3.  Normal PV anatomy see measurements above 4.  Moderate aortic root enlargement 4.2 cm 5.  No pericardial effusion 6.  No ASD/PFO   ETT 10/17/17 (for flecainide): Blood pressure demonstrated a normal response to exercise. Horizontal ST segment depression ST segment depression of 1 mm was noted during stress in the V4, V5 and V6 leads.   Patient only achieved 64% PMHR Baseline ECG voltage for LVH 1 mm horizontal ST depression in lateral leads with stress Suggest further ischemic testing  - Reviewed by Roderic Palau, NP at [facility], "Exercise treadmill test for flecainide use did not show any interval lengthening or afib with exercise on flecainide 75 mg bid, had some st changes on prior stress test as well"   Normal carotid arteries and patent vertebral arteries with antegrade flow on carotid US 03/14/13.   Past Medical History:  Diagnosis  Date   Allergic rhinitis    Aortic insufficiency    CAT SCRATCH 09/10/2009   Qualifier: Diagnosis of  By: Regis Bill MD, [name]    Clavicle fracture 12/2006   left   Diverticulosis    HTN (hypertension)    Osteopenia    Paroxysmal atrial fibrillation (HCC)    Pneumonia    PONV (postoperative nausea and vomiting)    Vision disturbance 03/13/2013   Negative neuro MRA MRI    Past Surgical History:  Procedure Laterality Date   ABDOMINAL HYSTERECTOMY     pt denies 04/15/14   APPENDECTOMY     ATRIAL FIBRILLATION ABLATION N/A 02/01/2021   Procedure: ATRIAL FIBRILLATION ABLATION;  Surgeon: Thompson Grayer, MD;  Location: [place] [facility];  Service: Cardiovascular;  Laterality: N/A;   LAPAROSCOPIC SALPINGO OOPHERECTOMY Right    TONSILLECTOMY AND ADENOIDECTOMY      MEDICATIONS: No current facility-administered medications for this encounter.    acetaminophen (TYLENOL) 500 MG tablet   alendronate (FOSAMAX) 70 MG tablet   apixaban (ELIQUIS) 5 MG TABS tablet   Calcium Carb-Cholecalciferol (CALCIUM+D3 PO)   diltiazem (CARDIZEM) 30 MG tablet   diltiazem (TIAZAC) 300 MG 24 hr capsule   dofetilide (TIKOSYN) 250 MCG capsule   LORazepam (ATIVAN) 0.5 MG tablet   metoprolol tartrate (LOPRESSOR) 25 MG tablet   Multiple Vitamins-Minerals (PRESERVISION AREDS PO)   potassium chloride SA (KLOR-CON) 20 MEQ tablet   cyclobenzaprine (FLEXERIL) 5 MG tablet   meloxicam (MOBIC) 7.5 MG tablet   methocarbamol (ROBAXIN) 500 MG tablet   potassium chloride (KLOR-CON) 10 MEQ tablet    Myra Gianotti, PA-C  Surgical Short Stay/Anesthesiology [facility] Phone [phone] [facility] [place] Phone [phone] 09/02/2021 9:52 AM

## 2021-09-02 NOTE — Telephone Encounter (Signed)
   Name: [name] [name]  DOB: 02-Dec-1950  MRN: [id number]   Primary Cardiologist: Minus Breeding, MD  Chart reviewed as part of pre-operative protocol coverage. Patient was contacted 09/02/2021 in reference to pre-operative risk assessment for pending surgery as outlined below.  [name] [name] was last seen on 07/05/2021 by Oda Kilts, PA-C.  Since that day, [name] [name] has done fine from a cardiac standpoint. She has continued to have intermittent episodes of atrial fibrillation but burden overall seems less. She injured her back recently which has slowed her down, though prior to his was able to complete 4 METs without anginal complaints. .  Therefore, based on ACC/AHA guidelines, the patient would be at acceptable risk for the planned procedure without further cardiovascular testing.   The patient was advised that if she develops new symptoms prior to surgery to contact our office to arrange for a follow-up visit, and she verbalized understanding.  Per pharmacy recommendation, patient can hold Eliquis 2 days prior to her upcoming Bronchoscopy with plans to restart as soon as she is cleared to do so by her surgeon. Patient tells me Dr. Lamonte Sakai has cleared her to continue eliquis throughout the perioperative period.   I will route this recommendation to the requesting party via Epic fax function and remove from pre-op pool. Please call with questions.  Abigail Butts, PA-C 09/02/2021, 2:37 PM

## 2021-09-02 NOTE — Anesthesia Preprocedure Evaluation (Deleted)
Anesthesia Evaluation    Airway        Dental   Pulmonary           Cardiovascular hypertension,   Echo 04/28/21: IMPRESSIONS  1. Left ventricular ejection fraction, by estimation, is 50 to 55%. The  left ventricle has low normal function. The left ventricle has no regional  wall motion abnormalities. Left ventricular diastolic parameters are  indeterminate.  2. Right ventricular systolic function is normal. The right ventricular  size is normal. There is normal pulmonary artery systolic pressure. The  estimated right ventricular systolic pressure is 30.0 mmHg.  3. The mitral valve is normal in structure. Trivial mitral valve  regurgitation. No evidence of mitral stenosis.  4. The aortic valve is grossly normal. There is mild calcification of the  aortic valve. Aortic valve regurgitation is moderate to severe. No aortic  stenosis is present.  5. The inferior vena cava is normal in size with greater than 50%  respiratory variability, suggesting right atrial pressure of 3 mmHg.  - Conclusion(s)/Recommendation(s): Jet of aortic valve regurgitation is  eccentric and broad based, and may be underestimated by visual assessment.  Consider cardiac MRI for quantitation of aortic valve regurgitation if  clinically indicated.     Neuro/Psych    GI/Hepatic   Endo/Other    Renal/GU      Musculoskeletal   Abdominal   Peds  Hematology   Anesthesia Other Findings   Reproductive/Obstetrics                             Anesthesia Physical Anesthesia Plan  ASA:   Anesthesia Plan:    Post-op Pain Management:    Induction:   PONV Risk Score and Plan:   Airway Management Planned:   Additional Equipment:   Intra-op Plan:   Post-operative Plan:   Informed Consent:   Plan Discussed with:   Anesthesia Plan Comments: (PAT note written 09/02/2021 by Myra Gianotti, PA-C. She is continuing  Eliquis. )        Anesthesia Quick Evaluation

## 2021-09-02 NOTE — Telephone Encounter (Signed)
"  At 3 p.m., Tuesday, Nov. 8, 24 hours after my bronchoscopy, I have my annual visit with my retina. specialist. He is sure to dilate my eyes. Is that too soon after the bronchoscopy? [facility] nurse said that I should ask the anesthesiologist on Monday, and I certainly can, but does  that give the retina specialist enough notice if I have to reschedule? I have already rescheduled that appointment twice, but I'll reschedule again if needed. Thank you."   Dr. Lamonte Sakai please advise.

## 2021-09-05 ENCOUNTER — Ambulatory Visit (HOSPITAL_COMMUNITY)
Admission: RE | Admit: 2021-09-05 | Discharge: 2021-09-05 | Disposition: A | Discharge status: Home / Self Care | Payer: Medicare Other | Source: Ambulatory Visit | Attending: Emergency Medicine | Admitting: Emergency Medicine

## 2021-09-05 ENCOUNTER — Telehealth: Payer: Self-pay | Admitting: Emergency Medicine

## 2021-09-05 ENCOUNTER — Other Ambulatory Visit: Payer: Self-pay

## 2021-09-05 ENCOUNTER — Encounter (HOSPITAL_COMMUNITY)
Admission: RE | Disposition: A | Discharge status: Home / Self Care | Payer: Self-pay | Source: Ambulatory Visit | Attending: Emergency Medicine

## 2021-09-05 ENCOUNTER — Encounter (HOSPITAL_COMMUNITY): Payer: Self-pay | Admitting: Emergency Medicine

## 2021-09-05 DIAGNOSIS — R918 Other nonspecific abnormal finding of lung field: Secondary | ICD-10-CM | POA: Insufficient documentation

## 2021-09-05 DIAGNOSIS — R9389 Abnormal findings on diagnostic imaging of other specified body structures: Secondary | ICD-10-CM

## 2021-09-05 DIAGNOSIS — R053 Chronic cough: Secondary | ICD-10-CM | POA: Diagnosis not present

## 2021-09-05 DIAGNOSIS — J479 Bronchiectasis, uncomplicated: Secondary | ICD-10-CM | POA: Diagnosis present

## 2021-09-05 HISTORY — DX: Other specified postprocedural states: Z98.890

## 2021-09-05 HISTORY — DX: Pneumonia, unspecified organism: J18.9

## 2021-09-05 HISTORY — DX: Nausea with vomiting, unspecified: R11.2

## 2021-09-05 HISTORY — PX: BRONCHIAL WASHINGS: SHX5105

## 2021-09-05 HISTORY — PX: VIDEO BRONCHOSCOPY: SHX5072

## 2021-09-05 SURGERY — VIDEO BRONCHOSCOPY WITHOUT FLUORO
Anesthesia: Moderate Sedation

## 2021-09-05 MED ORDER — FENTANYL CITRATE (PF) 100 MCG/2ML IJ SOLN
INTRAMUSCULAR | Status: DC | PRN
  Administered 2021-09-05: 50 ug via INTRAVENOUS
  Administered 2021-09-05 (×2): 25 ug via INTRAVENOUS

## 2021-09-05 MED ORDER — PHENYLEPHRINE HCL 0.25 % NA SOLN: 1.0000 | Freq: Four times a day (QID) | NASAL | Status: DC | PRN

## 2021-09-05 MED ORDER — SODIUM CHLORIDE 0.9 % IV SOLN
INTRAVENOUS | Status: AC | PRN
  Administered 2021-09-05: 500 mL via INTRAVENOUS

## 2021-09-05 MED ORDER — MIDAZOLAM HCL (PF) 5 MG/ML IJ SOLN
INTRAMUSCULAR | Status: AC
  Filled 2021-09-05: qty 2

## 2021-09-05 MED ORDER — FENTANYL CITRATE (PF) 100 MCG/2ML IJ SOLN
INTRAMUSCULAR | Status: AC
  Filled 2021-09-05: qty 4

## 2021-09-05 MED ORDER — ALENDRONATE SODIUM 70 MG PO TABS
70.0000 mg | ORAL_TABLET | ORAL | Status: DC
Start: 2021-09-05 — End: 2022-05-22

## 2021-09-05 MED ORDER — LIDOCAINE HCL (PF) 1 % IJ SOLN
INTRAMUSCULAR | Status: AC
  Filled 2021-09-05: qty 30

## 2021-09-05 MED ORDER — PHENYLEPHRINE HCL 0.25 % NA SOLN
NASAL | Status: AC
  Filled 2021-09-05: qty 15

## 2021-09-05 MED ORDER — LIDOCAINE HCL (PF) 1 % IJ SOLN
INTRAMUSCULAR | Status: DC | PRN
  Administered 2021-09-05: 12 mL

## 2021-09-05 MED ORDER — LIDOCAINE HCL URETHRAL/MUCOSAL 2 % EX GEL
CUTANEOUS | Status: AC
  Filled 2021-09-05: qty 6

## 2021-09-05 MED ORDER — MIDAZOLAM HCL (PF) 5 MG/ML IJ SOLN
INTRAMUSCULAR | Status: DC | PRN
  Administered 2021-09-05: 1 mg via INTRAVENOUS
  Administered 2021-09-05: 2 mg via INTRAVENOUS
  Administered 2021-09-05: 1 mg via INTRAVENOUS

## 2021-09-05 MED ORDER — LIDOCAINE HCL URETHRAL/MUCOSAL 2 % EX GEL: 1.0000 "application " | Freq: Once | CUTANEOUS | Status: DC

## 2021-09-05 NOTE — Discharge Instructions (Signed)
Flexible Bronchoscopy, Care After This sheet gives you information about how to care for yourself after your test. Your doctor may also give you more specific instructions. If you have problems or questions, contact your doctor. Follow these instructions at home: Eating and drinking Do not eat or drink anything (not even water) for 2 hours after your test, or until your numbing medicine (local anesthetic) wears off. When your numbness is gone and your cough and gag reflexes have come back, you may: Eat only soft foods. Slowly drink liquids. The day after the test, go back to your normal diet. Driving Do not drive for 24 hours if you were given a medicine to help you relax (sedative). Do not drive or use heavy machinery while taking prescription pain medicine. General instructions  Take over-the-counter and prescription medicines only as told by your doctor. Return to your normal activities as told. Ask what activities are safe for you. Do not use any products that have nicotine or tobacco in them. This includes cigarettes and e-cigarettes. If you need help quitting, ask your doctor. Keep all follow-up visits as told by your doctor. This is important. It is very important if you had a tissue sample (biopsy) taken. Get help right away if: You have shortness of breath that gets worse. You get light-headed. You feel like you are going to pass out (faint). You have chest pain. You cough up: More than a little blood. More blood than before. Summary Do not eat or drink anything (not even water) until your numbing medicine wears off. Do not use cigarettes. Do not use e-cigarettes. Get help right away if you have chest pain.    This information is not intended to replace advice given to you by your health care provider. Make sure you discuss any questions you have with your health care provider. Document Released: 08/13/2009 Document Revised: 09/28/2017 Document Reviewed:  11/03/2016 Elsevier Patient Education  2020 [employer].

## 2021-09-05 NOTE — Telephone Encounter (Signed)
I would like to set pt up for an OV in 4-6 weeks. Thanks.

## 2021-09-05 NOTE — Interval H&P Note (Signed)
History and Physical Interval Note:  09/05/2021 2:12 PM  [name] [name]  has presented today for surgery, with the diagnosis of abnormal ct, cough.  The various methods of treatment have been discussed with the patient and family. After consideration of risks, benefits and other options for treatment, the patient has consented to  Procedure(s): VIDEO BRONCHOSCOPY WITHOUT FLUORO (N/A) as a surgical intervention.  The patient's history has been reviewed, patient examined, no change in status, stable for surgery.  I have reviewed the patient's chart and labs.  Questions were answered to the patient's satisfaction.     Collene Gobble

## 2021-09-05 NOTE — Op Note (Signed)
Video Bronchoscopy Procedure Note  Date of Operation: 09/05/2021  Pre-op Diagnosis: Bilateral pulmonary infiltrates  Post-op Diagnosis: Same  Surgeon: Baltazar Apo  Assistants: none  Anesthesia: conscious sedation, moderate sedation  Meds Given: fentanyl 100 mcg, versed 4 mg in divided doses, 1% lidocaine 10 cc total  Operation: Flexible video fiberoptic bronchoscopy and biopsies.  Estimated Blood Loss: 0 cc  Complications: none noted  Indications and History: [name] [name] is 70 y.o. with history of cough and bilateral infiltrates on CT chest consistent with possible mycobacterial disease.[name]  Recommendation was to perform video fiberoptic bronchoscopy with biopsies. The risks, benefits, complications, treatment options and expected outcomes were discussed with the patient.  The possibilities of pneumothorax, pneumonia, reaction to medication, pulmonary aspiration, perforation of a viscus, bleeding, failure to diagnose a condition and creating a complication requiring transfusion or operation were discussed with the patient who freely signed the consent.    Description of Procedure: The patient was seen in the Preoperative Area, was examined and was deemed appropriate to proceed.  The patient was taken to [facility] endoscopy room 1, identified as [name] and the procedure verified as Flexible Video Fiberoptic Bronchoscopy.  A Time Out was held and the above information confirmed.   Conscious sedation was initiated as indicated above. The video fiberoptic bronchoscope was introduced via the oropharynx and a general inspection was performed which showed tan secretions in the posterior pharynx and also in all airways.  There were normal cords, normal trachea, normal main carina. The R sided airways were inspected and showed normal RUL, BI, RML and RLL. The L side was then inspected. The LLL, Lingular and LUL airways were normal.  A bronchoalveolar lavage was performed in the right upper  lobe, 60 cc normal saline instilled and approximately 25 returned.  This will be sent for microbiology.  The patient tolerated the procedure well. The bronchoscope was removed. There were no obvious complications.   Samples: 1.  Bronchoalveolar lavage from the right upper lobe  Plans:  We will review the cytology, pathology and microbiology results with the patient when they become available.  Outpatient followup will be with Dr Lamonte Sakai.    Baltazar Apo, MD, PhD 09/05/2021, 5:05 PM [facility] [phone] or if no answer [phone]

## 2021-09-06 ENCOUNTER — Encounter (INDEPENDENT_AMBULATORY_CARE_PROVIDER_SITE_OTHER): Payer: Self-pay | Admitting: Ophthalmology

## 2021-09-06 ENCOUNTER — Telehealth: Payer: Self-pay | Admitting: Emergency Medicine

## 2021-09-06 ENCOUNTER — Ambulatory Visit (INDEPENDENT_AMBULATORY_CARE_PROVIDER_SITE_OTHER): Payer: Medicare Other | Admitting: Ophthalmology

## 2021-09-06 DIAGNOSIS — H35371 Puckering of macula, right eye: Secondary | ICD-10-CM | POA: Diagnosis not present

## 2021-09-06 DIAGNOSIS — H35372 Puckering of macula, left eye: Secondary | ICD-10-CM

## 2021-09-06 DIAGNOSIS — H353132 Nonexudative age-related macular degeneration, bilateral, intermediate dry stage: Secondary | ICD-10-CM

## 2021-09-06 NOTE — Progress Notes (Signed)
09/06/2021     CHIEF COMPLAINT Patient presents for  Chief Complaint  Patient presents with   Retina Follow Up      HISTORY OF PRESENT ILLNESS: [name] [name] is a 70 y.o. female who presents to the clinic today for:   HPI     Retina Follow Up   Patient presents with  Other (ERM).  In both eyes.  This started 1 year ago.  Severity is moderate.  Duration of 1 year.  Since onset it is stable.  I, the attending physician,  performed the HPI with the patient and updated documentation appropriately.        Comments   1 yr fu OU oct. Patient states vision is stable and unchanged since last visit. Denies any new floaters or FOL. Pt states "I had a cardiac ablation in April, 3 day hospitalization for Tikosyn, and then an ER visit for COVID and pneumonia, and yesterday I had bronchoscopy."      Last edited by Laurin Coder on 09/06/2021  3:06 PM.      Referring physician: Burnis Medin, MD [place],  [place] [address]  HISTORICAL INFORMATION:   Selected notes from the [facility]: No current outpatient medications on file. (Ophthalmic Drugs)   No current facility-administered medications for this visit. (Ophthalmic Drugs)   Current Outpatient Medications (Other)  Medication Sig   acetaminophen (TYLENOL) 500 MG tablet Take 1,000 mg by mouth every 6 (six) hours as needed for moderate pain.   alendronate (FOSAMAX) 70 MG tablet Take 1 tablet (70 mg total) by mouth every Saturday. TAKE 1 TABLET BY MOUTH ONCE A WEEK WITH A FULL GLASS OF WATER ON AN EMPTY STOMACH   apixaban (ELIQUIS) 5 MG TABS tablet TAKE 1 TABLET BY MOUTH  TWICE DAILY   Calcium Carb-Cholecalciferol (CALCIUM+D3 PO) Take 1 tablet by mouth daily with supper.   diltiazem (CARDIZEM) 30 MG tablet Take 1 tablet every 4 hours AS NEEDED for breakthrough AFIB   diltiazem (TIAZAC) 300 MG 24 hr capsule Take 1 capsule (300 mg total) by mouth daily.   dofetilide  (TIKOSYN) 250 MCG capsule Take 1 capsule (250 mcg total) by mouth 2 (two) times daily.   LORazepam (ATIVAN) 0.5 MG tablet Take 0.5 mg by mouth daily as needed for anxiety (when flying).   metoprolol tartrate (LOPRESSOR) 25 MG tablet Take 1 tablet (25 mg total) by mouth 2 (two) times daily.   Multiple Vitamins-Minerals (PRESERVISION AREDS PO) Take 1 capsule by mouth in the morning and at bedtime.   potassium chloride (KLOR-CON) 10 MEQ tablet Take 2 tablets (20 mEq total) by mouth daily.   potassium chloride SA (KLOR-CON) 20 MEQ tablet Take 20 mEq by mouth in the morning.   No current facility-administered medications for this visit. (Other)      REVIEW OF SYSTEMS:    ALLERGIES Allergies  Allergen Reactions   Latex Hives   Other Rash and Other (See Comments)    Adhesive pads left on skin for extended periods = BURNS AND RASHES ON THE SKIN!!!!   Codeine Phosphate Other (See Comments)    Drowsiness    Demerol [Meperidine] Nausea And Vomiting   Protonix [Pantoprazole] Rash   Sulfa Antibiotics Rash   Tens Therapy Replace Back Pads Itching and Rash    PAST MEDICAL HISTORY Past Medical History:  Diagnosis Date   Allergic rhinitis    Aortic insufficiency  CAT SCRATCH 09/10/2009   Qualifier: Diagnosis of  By: Regis Bill MD, [name]    Clavicle fracture 12/2006   left   Diverticulosis    HTN (hypertension)    Osteopenia    Paroxysmal atrial fibrillation (HCC)    Pneumonia    PONV (postoperative nausea and vomiting)    Vision disturbance 03/13/2013   Negative neuro MRA MRI   Past Surgical History:  Procedure Laterality Date   ABDOMINAL HYSTERECTOMY     pt denies 04/15/14   APPENDECTOMY     ATRIAL FIBRILLATION ABLATION N/A 02/01/2021   Procedure: ATRIAL FIBRILLATION ABLATION;  Surgeon: Thompson Grayer, MD;  Location: [facility];  Service: Cardiovascular;  Laterality: N/A;   LAPAROSCOPIC SALPINGO OOPHERECTOMY Right    TONSILLECTOMY AND ADENOIDECTOMY      FAMILY  HISTORY Family History  Problem Relation Age of Onset   Scleroderma Mother    Hyperlipidemia Mother    Coronary artery disease Father 92   Esophageal cancer Paternal Grandmother     SOCIAL HISTORY Social History   Tobacco Use   Smoking status: Never   Smokeless tobacco: Never  Substance Use Topics   Alcohol use: Not Currently   Drug use: No         OPHTHALMIC EXAM:  Base Eye Exam     Visual Acuity (ETDRS)       Right Left   Dist cc 20/30 -2 20/25 +1    Correction: Glasses         Tonometry (Tonopen, 3:10 PM)       Right Left   Pressure 4 9         Pupils       Pupils Dark Light Shape React APD   Right PERRL 4 3 Round Brisk None   Left PERRL 4 3 Round Brisk None         Extraocular Movement       Right Left    Full Full         Neuro/Psych     Oriented x3: Yes   Mood/Affect: Normal         Dilation     Both eyes: 1.0% Mydriacyl, 2.5% Phenylephrine @ 3:10 PM           Slit Lamp and Fundus Exam     External Exam       Right Left   External Normal Normal         Slit Lamp Exam       Right Left   Lids/Lashes Normal Normal   Conjunctiva/Sclera White and quiet White and quiet   Cornea Clear Clear   Anterior Chamber Deep and quiet Deep and quiet   Iris Round and reactive Round and reactive   Lens Centered posterior chamber intraocular lens Centered posterior chamber intraocular lens   Anterior Vitreous Normal Normal         Fundus Exam       Right Left   Posterior Vitreous Posterior vitreous detachment Clear vitrectomized   Disc Normal Normal   C/D Ratio 0.0 0.0   Macula Minor clinical epiretinal membrane with no  topographic distortion, Hard drusen, no exudates, no macular thickening Normal, small macular atrophy temporally   Vessels Normal Normal   Periphery Normal Normal            IMAGING AND PROCEDURES  Imaging and Procedures for 09/06/21  OCT, Retina - OU - Both Eyes       Right Eye Quality was  good. Scan locations included subfoveal. Central Foveal Thickness: 315. Progression has been stable. Findings include epiretinal membrane, retinal drusen .   Left Eye Quality was good. Scan locations included subfoveal. Central Foveal Thickness: 428. Progression has improved.   Notes OD with nondistorting epiretinal membrane with minor thickening perifoveal but none in the fovea.  Moderate to intermediate age-related macular degeneration right eye with no signs of CNVM.  OS status post vitrectomy and ILM peel, much less center involved retinal thickening, though still not normal.             ASSESSMENT/PLAN:  Right epiretinal membrane Minor epiretinal membrane OD none distorting will observe  Intermediate stage nonexudative age-related macular degeneration of both eyes The nature of age--related macular degeneration was discussed with the patient as well as the distinction between dry and wet types. Checking an Amsler Grid daily with advice to return immediately should a distortion develop, was given to the patient. The patient 's smoking status now and in the past was determined and advice based on the AREDS study was provided regarding the consumption of antioxidant supplements. AREDS 2 vitamin formulation was recommended. Consumption of dark leafy vegetables and fresh fruits of various colors was recommended. Treatment modalities for wet macular degeneration particularly the use of intravitreal injections of anti-blood vessel growth factors was discussed with the patient. Avastin, Lucentis, and Eylea are the available options. On occasion, therapy includes the use of photodynamic therapy and thermal laser. Stressed to the patient do not rub eyes.  Patient was advised to check Amsler Grid daily and return immediately if changes are noted. Instructions on using the grid were given to the patient. All patient questions were answered.      ICD-10-CM   1. Left epiretinal membrane  H35.372  OCT, Retina - OU - Both Eyes    2. Right epiretinal membrane  H35.371 OCT, Retina - OU - Both Eyes    3. Intermediate stage nonexudative age-related macular degeneration of both eyes  H35.3132       1.  OU stable overall good acuity  2.  OD, minor epiretinal membrane no therapy required  3.  Patient does have intermediate ARMD no signs of complication  Ophthalmic Meds Ordered this visit:  No orders of the defined types were placed in this encounter.      Return in about 1 year (around 09/06/2022) for DILATE OU, OCT.  There are no Patient Instructions on file for this visit.   Explained the diagnoses, plan, and follow up with the patient and they expressed understanding.  Patient expressed understanding of the importance of proper follow up care.   Clent Demark Jericha Bryden M.D. Diseases & Surgery of the Retina and Vitreous [facility]ina [facility] Diabetic [place] 09/06/21     Abbreviations: M myopia (nearsighted); A astigmatism; H hyperopia (farsighted); P presbyopia; Mrx spectacle prescription;  CTL contact lenses; OD right eye; OS left eye; OU both eyes  XT exotropia; ET esotropia; PEK punctate epithelial keratitis; PEE punctate epithelial erosions; DES dry eye syndrome; MGD meibomian gland dysfunction; ATs artificial tears; PFAT's preservative free artificial tears; [place]ar nuclear sclerotic cataract; PSC posterior subcapsular cataract; ERM epi-retinal membrane; PVD posterior vitreous detachment; RD retinal detachment; DM diabetes mellitus; DR diabetic retinopathy; NPDR non-proliferative diabetic retinopathy; PDR proliferative diabetic retinopathy; CSME clinically significant macular edema; DME diabetic macular edema; dbh dot blot hemorrhages; CWS cotton wool spot; POAG primary open angle glaucoma; C/D cup-to-disc ratio; HVF humphrey visual field; GVF goldmann visual field; OCT optical coherence tomography; IOP  intraocular pressure; BRVO Branch retinal vein occlusion; CRVO central retinal vein occlusion;  CRAO central retinal artery occlusion; BRAO branch retinal artery occlusion; RT retinal tear; SB scleral buckle; PPV pars plana vitrectomy; VH Vitreous hemorrhage; PRP panretinal laser photocoagulation; IVK intravitreal kenalog; VMT vitreomacular traction; MH Macular hole;  NVD neovascularization of the disc; NVE neovascularization elsewhere; AREDS age related eye disease study; ARMD age related macular degeneration; POAG primary open angle glaucoma; EBMD epithelial/anterior basement membrane dystrophy; ACIOL anterior chamber intraocular lens; IOL intraocular lens; PCIOL posterior chamber intraocular lens; Phaco/IOL phacoemulsification with intraocular lens placement; [place] photorefractive keratectomy; LASIK laser assisted in situ keratomileusis; HTN hypertension; DM diabetes mellitus; COPD chronic obstructive pulmonary disease

## 2021-09-06 NOTE — Telephone Encounter (Signed)
RB please advise. Thanks  [name] [name] D  P [facility] Pool Dr. Lamonte Sakai:   Thanks to you and your team for your great care yesterday during my bronchoscopy. Typically I take potassium with the Tikosyn (Dofetilide) each morning. I'll certainly take the Tikosyn, but do I resume the potassium this morning with it? I had not taken the potassium on Monday with it, following pre-op orders. And what about vitamins such as PreserVision AREDS2 and calcium and vitaminD? When do I resume those?  Thank you very much.   [name] [name], [phone]

## 2021-09-06 NOTE — Assessment & Plan Note (Signed)

## 2021-09-06 NOTE — Assessment & Plan Note (Signed)
Minor epiretinal membrane OD none distorting will observe

## 2021-09-06 NOTE — Telephone Encounter (Signed)
I called the patient and she verbalized understanding. Nothing further needed.

## 2021-09-06 NOTE — Telephone Encounter (Signed)
I called the patient back and she has not taken her Potassium since her procedure. She was told not to take this on the day of her procedure and was not told when to re-start. She has been prescribed potassium  this to take with her Tikosyn.   She wants to know if she can resume her vitamins and her preservision. Please advise.

## 2021-09-06 NOTE — Telephone Encounter (Signed)
Yes - from pulmonary p[perspective, post-op from bronchoscopy, she can go back on all of her usual medications. If she stopped them for some other reason (on some other providers' orders), then she would need to check with her specific providers.

## 2021-09-07 ENCOUNTER — Encounter (HOSPITAL_COMMUNITY): Payer: Self-pay | Admitting: Emergency Medicine

## 2021-09-07 LAB — ACID FAST SMEAR (AFB, MYCOBACTERIA): Acid Fast Smear: NEGATIVE

## 2021-09-08 LAB — CULTURE, RESPIRATORY W GRAM STAIN
Culture: NORMAL
Gram Stain: NONE SEEN

## 2021-09-08 NOTE — Telephone Encounter (Signed)
Yes, 8 weeks will be ok

## 2021-09-08 NOTE — Telephone Encounter (Signed)
Pt is scheduled for OV on 11/01/21. Do we need to see her sooner?

## 2021-09-12 NOTE — Telephone Encounter (Signed)
Dr. Lamonte Sakai please advise on the following My Chart message:   Dr. Lamonte Sakai:   Are the test results complete? I see three from last week in My Chart and wondered how you interpret them?    Also, I'm scheduled to have my third COVID-19 booster at 3 p.m. Tuesday, Nov. 15. Should I proceed with that as planned?   Thank you for your expertise.   Mariann Laster, [phone]  Thank you

## 2021-09-13 ENCOUNTER — Telehealth: Payer: Self-pay | Admitting: *Deleted

## 2021-09-13 MED ORDER — POTASSIUM CHLORIDE CRYS ER 10 MEQ PO TBCR: 20.0000 meq | EXTENDED_RELEASE_TABLET | Freq: Every day | ORAL | 3 refills | Status: DC

## 2021-09-13 NOTE — Telephone Encounter (Signed)
Spoke with pt, she prefers the 10 meq potassium pill because she is having trouble swallowing the 20 meq size tablet. New script sent to the pharmacy

## 2021-09-15 ENCOUNTER — Telehealth: Payer: Self-pay | Admitting: Cardiology

## 2021-09-15 NOTE — Telephone Encounter (Signed)
[facility] called about the formulation for her potassium chloride (KLOR-CON) 10 MEQ tablet.  They are saying they received two scripts for this patient for different klor-con.  Ref # [id number]

## 2021-09-15 NOTE — Telephone Encounter (Signed)
Spoke with [facility], potassium prescription clarified.

## 2021-09-20 ENCOUNTER — Encounter: Payer: Self-pay | Admitting: Cardiology

## 2021-09-20 NOTE — Telephone Encounter (Signed)
RB please advise. Thanks  Hi, Dr. Lamonte Sakai: Just wanted to let you know that I am still coughing. Not that I'm seeking any more procedures in the short term unless needed, of course, especially because we're entertaining on Thursday and then have relatives flying in from Nov. 30 though Dec. 2. But some relief from this cough would be very welcome. Thank you, and have an enjoyable and well-deserved holiday.    [name] [name]

## 2021-09-20 NOTE — Telephone Encounter (Signed)
Let her know that we can send tessalon 200mg  q6h prn cough, or I can order codeine cough syrup if she would like. Let me know

## 2021-09-21 ENCOUNTER — Other Ambulatory Visit: Payer: Self-pay | Admitting: Cardiology

## 2021-09-21 MED ORDER — BENZONATATE 200 MG PO CAPS: 200.0000 mg | ORAL_CAPSULE | Freq: Three times a day (TID) | ORAL | 1 refills | Status: DC | PRN

## 2021-09-21 NOTE — Telephone Encounter (Signed)
RB please advise. Thanks   Since I can't take codeine because it knocks me out, I guess tessaslon pearles would be best of the two. What are its side effects and impact?  [name] [name]  She also wanted to know if there are new results in her mychart?

## 2021-09-21 NOTE — Telephone Encounter (Signed)
No significant adverse reactions from tessalon - can sometimes cause some nausea, upset stomach.   Her fungal cx's are final and are negative. AFB cultures are all still pending.

## 2021-09-26 NOTE — Telephone Encounter (Signed)
Please advise on pt email, thanks  Thank you for prescribing the Benzonatate 200 mg capsule, aka Tessalon Pearles, for my continuing cough. I have them and want to use them, but have not taken any of them yet. Before I do, I wanted to let you know that I'm thinking that mucus from the cough must be draining into my stomach and making it upset in the mornings particularly. Will this medication make the stomach upset worse because of this? Is it still OK to take the medication? I don't want it to be worse. Thank you for your expertise. [name] [name]

## 2021-09-29 ENCOUNTER — Ambulatory Visit: Payer: Medicare Other | Admitting: Internal Medicine

## 2021-09-29 ENCOUNTER — Other Ambulatory Visit: Payer: Self-pay

## 2021-09-29 ENCOUNTER — Encounter: Payer: Self-pay | Admitting: Internal Medicine

## 2021-09-29 VITALS — BP 118/60 | HR 61 | Ht 64.0 in | Wt 93.8 lb

## 2021-09-29 DIAGNOSIS — E559 Vitamin D deficiency, unspecified: Secondary | ICD-10-CM

## 2021-09-29 DIAGNOSIS — M810 Age-related osteoporosis without current pathological fracture: Secondary | ICD-10-CM

## 2021-09-29 LAB — FUNGUS CULTURE RESULT

## 2021-09-29 LAB — FUNGUS CULTURE WITH STAIN

## 2021-09-29 LAB — FUNGAL ORGANISM REFLEX

## 2021-09-29 LAB — VITAMIN D 25 HYDROXY (VIT D DEFICIENCY, FRACTURES): VITD: 42.31 ng/mL (ref 30.00–100.00)

## 2021-09-29 NOTE — Patient Instructions (Addendum)
Continue Fosamax 70 mg weekly.  Continue vitamin D 2400 units daily. Please let me know if the supplement dose is different.  Please stop at the lab.  Read the following books: Dr. Karl Luke - Prevent and Reverse Heart Disease Dr. Alden Benjamin - How Not to Die   Please come back for a follow-up appointment in 1 year.

## 2021-09-29 NOTE — Progress Notes (Signed)
Patient ID: [name] [name], female   DOB: [birth year], 70 y.o.   MRN: [id number]   This visit occurred during the SARS-CoV-2 public health emergency.  Safety protocols were in place, including screening questions prior to the visit, additional usage of staff PPE, and extensive cleaning of exam room while observing appropriate contact time as indicated for disinfecting solutions.   HPI  [name] [name] is a 70 y.o.-year-old pleasant female, returning for follow-up for osteoporosis.  Last visit 1 year ago. She has Medicare.  Interim history: No falls or fractures since last visit. No dizziness/vertigo/orthostasis/poor vision. She had multifocal pneumonia during investigation for chronic cough after having had COVID-19 in 05/2021 (after being admitted for Tykosin infusion).  She had a bronchoscopy with BAL.  She is waiting for the results.  Reviewed history: She was diagnosed with osteoporosis in 2017.  Reviewed previous DXA scan reports: 09/28/2020 ([facility]) Lumbar spine L1-L4 Femoral neck (FN)  T-score  -1.9 RFN: -2.5 LFN: -2.3  Change in BMD from previous DXA test (%) Down 2.% Up 2.6%   09/23/2018 ([facility]) Lumbar spine L1-L4 Femoral neck (FN)  T-score  -1.7 RFN: -2.7 LFN: -2.3  Change in BMD from previous DXA test (%) Up 1.4% Down 0.4%   Date L1-L4 T score FN T score FRAX   07/27/2016  -1.8 (-3.4%*) RFN: -2.6 (-10.2%*) LFN: -2.4   09/30/2010  -1.6 RFN: -2.0 LFN: -1.8 10 year MOF: 11.9%  10 year hip fracture: 2.2%    No fracture since last visitshe has a history of fract, however, ures: 1. collarbone - ~2010 - fell in a parking lot 2. L foot fx and sprained ankle - few years ago - tripped and fell 3. R foot fx and sprained ankle - 10/2015 She had pain in L shin >> saw Dr. Lanell Matar >> had Xrays >> osteoma.  Pain improved.  Reviewed previous osteoporosis treatments: - Fosamax for 5 years  - up to 2011 - We restarted Fosamax 70 mg weekly in 12/2016.  She tolerates this well  She  has a history of vitamin D insufficiency: Lab Results  Component Value Date   VD25OH 44.3 09/28/2020   VD25OH 57.64 11/20/2019   VD25OH 43.25 09/23/2018   VD25OH 40.16 10/19/2017   VD25OH 31.41 12/20/2016   VD25OH 28.88 (L) 09/14/2016   She is on 600 mg of calcium and 2400 units vitamin D daily (she believes -we will look at home and let me know if this is the exact dose).  She was walking and also doing Zumba, strength training 1-2 times a week but no exercise recently  She is not on vitamin A.  Menopause was in her late 61s.  She was on HRT.  Pt does have a FH of osteoporosis in mother - kyphosis.  No fractures.  No history of kidney stones.  No hyper or hypocalcemia or hyperparathyroidism: Lab Results  Component Value Date   PTH 59 09/14/2016   CALCIUM 9.5 07/05/2021   CALCIUM 8.7 (L) 06/24/2021   CALCIUM 9.4 06/09/2021   CALCIUM 9.4 06/03/2021   CALCIUM 9.6 06/02/2021   CALCIUM 9.0 06/01/2021   CALCIUM 9.5 05/31/2021   CALCIUM 9.5 05/31/2021   CALCIUM 9.7 01/10/2021   CALCIUM 9.3 09/28/2020   No history of thyrotoxicosis: Lab Results  Component Value Date   TSH 0.891 06/24/2021   TSH 0.82 09/28/2020   TSH 0.867 10/06/2019   TSH 0.80 05/17/2018   TSH 1.22 09/14/2016   No history of CKD: Lab Results  Component Value Date   BUN 14 07/05/2021   CREATININE 0.71 07/05/2021   She is on flecainide, metoprolol, Eliquis for paroxysmal A. fib.  She continues to go to the A. fib clinic.  She noticed that after stopping Singulair, she had less A. fib episodes. She goes to [facility] >> pelvic training.   ROS: + see HPI  I reviewed pt's medications, allergies, PMH, social hx, family hx, and changes were documented in the history of present illness. Otherwise, unchanged from my initial visit note.  Past Medical History:  Diagnosis Date   Allergic rhinitis    Aortic insufficiency    CAT SCRATCH 09/10/2009   Qualifier: Diagnosis of  By: Regis Bill MD, [name]     Clavicle fracture 12/2006   left   Diverticulosis    HTN (hypertension)    Osteopenia    Paroxysmal atrial fibrillation (HCC)    Pneumonia    PONV (postoperative nausea and vomiting)    Vision disturbance 03/13/2013   Negative neuro MRA MRI   Past Surgical History:  Procedure Laterality Date   ABDOMINAL HYSTERECTOMY     pt denies 04/15/14   APPENDECTOMY     ATRIAL FIBRILLATION ABLATION N/A 02/01/2021   Procedure: ATRIAL FIBRILLATION ABLATION;  Surgeon: Thompson Grayer, MD;  Location: [facility];  Service: Cardiovascular;  Laterality: N/A;   BRONCHIAL WASHINGS  09/05/2021   Procedure: BRONCHIAL WASHINGS;  Surgeon: Collene Gobble, MD;  Location: [facility];  Service: Cardiopulmonary;;   LAPAROSCOPIC SALPINGO OOPHERECTOMY Right    TONSILLECTOMY AND ADENOIDECTOMY     VIDEO BRONCHOSCOPY N/A 09/05/2021   Procedure: VIDEO BRONCHOSCOPY WITHOUT FLUORO;  Surgeon: Collene Gobble, MD;  Location: [facility];  Service: Cardiopulmonary;  Laterality: N/A;   Social History   Social History   Marital status: Married    Spouse name: N/A   Number of children: 0 - had infertility tx's in her 14s   Occupational History   Newspaper Jouralist    [employer]   Social History Main Topics   Smoking status: Never Smoker   Smokeless tobacco: Never Used   Alcohol use No     Comment: never   Drug use: No   Social History Narrative   Married   hh of 2    p0g0   Works for [employer] and record    Current Outpatient Medications on File Prior to Visit  Medication Sig Dispense Refill   acetaminophen (TYLENOL) 500 MG tablet Take 1,000 mg by mouth every 6 (six) hours as needed for moderate pain.     alendronate (FOSAMAX) 70 MG tablet Take 1 tablet (70 mg total) by mouth every Saturday. TAKE 1 TABLET BY MOUTH ONCE A WEEK WITH A FULL GLASS OF WATER ON AN EMPTY STOMACH     apixaban (ELIQUIS) 5 MG TABS tablet TAKE 1 TABLET BY MOUTH  TWICE DAILY 180 tablet 1   benzonatate (TESSALON) 200 MG  capsule Take 1 capsule (200 mg total) by mouth 3 (three) times daily as needed for cough. 30 capsule 1   Calcium Carb-Cholecalciferol (CALCIUM+D3 PO) Take 1 tablet by mouth daily with supper.     diltiazem (CARDIZEM) 30 MG tablet Take 1 tablet every 4 hours AS NEEDED for breakthrough AFIB 45 tablet 1   diltiazem (TIAZAC) 300 MG 24 hr capsule Take 1 capsule (300 mg total) by mouth daily. 90 capsule 3   dofetilide (TIKOSYN) 250 MCG capsule Take 1 capsule (250 mcg total) by mouth 2 (two)  times daily. 180 capsule 3   LORazepam (ATIVAN) 0.5 MG tablet Take 0.5 mg by mouth daily as needed for anxiety (when flying).     metoprolol tartrate (LOPRESSOR) 25 MG tablet TAKE 1 TABLET BY MOUTH  TWICE DAILY 180 tablet 1   Multiple Vitamins-Minerals (PRESERVISION AREDS PO) Take 1 capsule by mouth in the morning and at bedtime.     potassium chloride (KLOR-CON) 10 MEQ tablet Take 2 tablets (20 mEq total) by mouth daily. 180 tablet 3   No current facility-administered medications on file prior to visit.   Allergies  Allergen Reactions   Latex Hives   Other Rash and Other (See Comments)    Adhesive pads left on skin for extended periods = BURNS AND RASHES ON THE SKIN!!!!   Codeine Phosphate Other (See Comments)    Drowsiness    Demerol [Meperidine] Nausea And Vomiting   Protonix [Pantoprazole] Rash   Sulfa Antibiotics Rash   Tens Therapy Replace Back Pads Itching and Rash   Family History  Problem Relation Age of Onset   Scleroderma Mother    Hyperlipidemia Mother    Coronary artery disease Father 39   Esophageal cancer Paternal Grandmother    PE: BP 118/60 (BP Location: Right Arm, Patient Position: Sitting, Cuff Size: Normal)   Pulse 61   Ht _0  (1.626 m)   Wt 93 lb 12.8 oz (42.5 kg)   SpO2 98%   BMI 16.10 kg/m  Wt Readings from Last 3 Encounters:  09/29/21 93 lb 12.8 oz (42.5 kg)  09/05/21 95 lb (43.1 kg)  08/30/21 95 lb (43.1 kg)   Constitutional: Normal weight, in NAD Eyes: PERRLA,  EOMI, no exophthalmos ENT: moist mucous membranes, no thyromegaly, no cervical lymphadenopathy Cardiovascular: RRR, No MRG Respiratory: CTA B Musculoskeletal: no deformities, strength intact in all 4 Skin: moist, warm, no rashes Neurological: no tremor with outstretched hands, DTR normal in all 4  Assessment: 1. Osteoporosis  2. Vit D insufficiency  Plan: 1. Osteoporosis -Likely postmenopausal/age-related and she also has a family history of osteoporosis -She continues to remain at higher risk for fractures since her T-scores were lower than -2.5 -She initially agreed to start Prolia, however, this was too expensive so we ended up starting Fosamax 70 mg weekly in 12/2016.  She tolerates this well, without side effects.  At today's visit, she denies hip/thigh pain, or jaw pain.  No dental work in progress. -She previously has pain in the left shin and was found to have an osteoma with some muscle edema around it.  Since then, pain has improved -At this visit, we reviewed her latest bone density scan from 09/28/2020 which showed improved T-scores at RFN (but still in the osteoporotic range) slightly lower T score at the spine. LFN T-score was stable.   -She had a total of 4.5 years of Fosamax.  We discussed about continuing this until next bone density a year from now and then decide whether we can give her a drug holiday, however, if she remains at higher risk for fractures, we may not be able to stop. -She continues on vitamin D (will let me know the exact dose but believes this is 2400 units) and calcium  -We discussed about fall preventions.  She had no falls since last visit. -At last visit I suggested weightbearing exercises and I reinforced this today (also adding weights when walking, walking downhill, walking after a meal, rather than before) and explained why this is important.  I advised her  to exercise 30 minutes a day 5 times a week at least. She is not exercising now -but we  discussed that it is very important to start -Latest calcium and kidney function were normal: Lab Results  Component Value Date   CALCIUM 9.5 07/05/2021    Lab Results  Component Value Date   CREATININE 0.71 07/05/2021   BUN 14 07/05/2021  - we will not repeat these today  2. Vit D insufficiency -She continues to take a calcium supplement and also 2400 units vitamin D daily (she will look at home and let me know the exact dose) -Reviewed vitamin D level from last visit: 44, at goal -We will recheck this today  She needs to lay down for labs (2/2 nausea).  Msg sent by pt after the visit: Thank you for seeing me today, Dr. Cruzita Lederer. I don't think I have been taking enough vitamin D. Each Calcium and Vitamin D softgel that I take daily includes 12.5 MCG or 500 IU. But I also have available at home a soft gel with 1000 IUs or 25 mcg. If you want me to take 2400 units of vitamin D daily, I'm wondering whether I should take two of the pure vitamin D softgels plus one of the calcium softgels that contain vitamin D.   Office Visit on 09/29/2021  Component Date Value Ref Range Status   VITD 09/29/2021 42.31  30.00 - 100.00 ng/mL Final  Vitamin D is normal.  I would advise him to continue with 1000 units vitamin D daily.  Philemon Kingdom, MD PhD [facility]

## 2021-09-30 NOTE — Telephone Encounter (Signed)
RB please advise on results.  thanks

## 2021-10-03 NOTE — Telephone Encounter (Signed)
Her AFB cultures are still pending. They should be finalized in the next couple weeks. I'll keep watch for them to result.

## 2021-10-06 NOTE — Progress Notes (Signed)
Cardiology Office Note   Date:  10/07/2021   ID:  [name] [name], DOB [birth year], MRN [id number]  PCP:  Burnis Medin, MD  Cardiologist:   Minus Breeding, MD   Chief Complaint  Patient presents with   Palpitations      History of Present Illness: [name] [name] is a 70 y.o. female who for follow up of palpitations and aortic insufficiency.  She has paroxysmal atrial fibrillation. She is status post ablation.   She had Covid and she had increased fib post this.  She was started on Tikosyn in August.   We saw her prior to bronchoscopy.    She still feels the fibrillation and has some episodes lasting a couple of hours but it is much improved compared to previous.  She is lost a lot of weight because we talked about plant-based diet and she is finding it difficult to get in enough calories unfortunately.  She is not having any new shortness of breath, PND or orthopnea.  She is not having any new presyncope or syncope.  She has no chest pressure, neck or arm discomfort.  She has had weight loss.   Past Medical History:  Diagnosis Date   Allergic rhinitis    Aortic insufficiency    CAT SCRATCH 09/10/2009   Qualifier: Diagnosis of  By: [name] Bill MD, [name] [name]    Clavicle fracture 12/2006   left   Diverticulosis    HTN (hypertension)    Osteopenia    Paroxysmal atrial fibrillation (HCC)    Pneumonia    PONV (postoperative nausea and vomiting)    Vision disturbance 03/13/2013   Negative neuro MRA MRI    Past Surgical History:  Procedure Laterality Date   ABDOMINAL HYSTERECTOMY     pt denies 04/15/14   APPENDECTOMY     ATRIAL FIBRILLATION ABLATION N/A 02/01/2021   Procedure: ATRIAL FIBRILLATION ABLATION;  Surgeon: Thompson Grayer, MD;  Location: [facility];  Service: Cardiovascular;  Laterality: N/A;   BRONCHIAL WASHINGS  09/05/2021   Procedure: BRONCHIAL WASHINGS;  Surgeon: Collene Gobble, MD;  Location: [facility];  Service: Cardiopulmonary;;   LAPAROSCOPIC SALPINGO  OOPHERECTOMY Right    TONSILLECTOMY AND ADENOIDECTOMY     VIDEO BRONCHOSCOPY N/A 09/05/2021   Procedure: VIDEO BRONCHOSCOPY WITHOUT FLUORO;  Surgeon: Collene Gobble, MD;  Location: [facility];  Service: Cardiopulmonary;  Laterality: N/A;     Current Outpatient Medications  Medication Sig Dispense Refill   acetaminophen (TYLENOL) 500 MG tablet Take 1,000 mg by mouth every 6 (six) hours as needed for moderate pain.     alendronate (FOSAMAX) 70 MG tablet Take 1 tablet (70 mg total) by mouth every Saturday. TAKE 1 TABLET BY MOUTH ONCE A WEEK WITH A FULL GLASS OF WATER ON AN EMPTY STOMACH     apixaban (ELIQUIS) 5 MG TABS tablet TAKE 1 TABLET BY MOUTH  TWICE DAILY 180 tablet 1   benzonatate (TESSALON) 200 MG capsule Take 1 capsule (200 mg total) by mouth 3 (three) times daily as needed for cough. 30 capsule 1   Calcium Carb-Cholecalciferol (CALCIUM+D3 PO) Take 1 tablet by mouth daily with supper.     diltiazem (CARDIZEM) 30 MG tablet Take 1 tablet every 4 hours AS NEEDED for breakthrough AFIB 45 tablet 1   diltiazem (TIAZAC) 300 MG 24 hr capsule Take 1 capsule (300 mg total) by mouth daily. 90 capsule 3   dofetilide (TIKOSYN) 250 MCG capsule Take 1 capsule (250 mcg total) by  mouth 2 (two) times daily. 180 capsule 3   LORazepam (ATIVAN) 0.5 MG tablet Take 0.5 mg by mouth daily as needed for anxiety (when flying).     metoprolol tartrate (LOPRESSOR) 25 MG tablet TAKE 1 TABLET BY MOUTH  TWICE DAILY 180 tablet 1   Multiple Vitamins-Minerals (PRESERVISION AREDS PO) Take 1 capsule by mouth in the morning and at bedtime.     potassium chloride (KLOR-CON) 10 MEQ tablet Take 2 tablets (20 mEq total) by mouth daily. 180 tablet 3   No current facility-administered medications for this visit.    Allergies:   Latex, Other, Codeine phosphate, Demerol [meperidine], Protonix [pantoprazole], Sulfa antibiotics, and Tens therapy replace back pads    ROS:  Please see the history of present illness.   Otherwise,  review of systems are positive for none.   All other systems are reviewed and negative.    PHYSICAL EXAM: VS:  BP 118/62   Pulse 61   Ht 5\' 4"  (1.626 m)   Wt 91 lb 12.8 oz (41.6 kg)   SpO2 96%   BMI 15.76 kg/m  , BMI Body mass index is 15.76 kg/m. GENERAL:  Well appearing NECK:  No jugular venous distention, waveform within normal limits, carotid upstroke brisk and symmetric, no bruits, no thyromegaly LUNGS:  Clear to auscultation bilaterally CHEST:  Unremarkable HEART:  PMI not displaced or sustained,S1 and S2 within normal limits, no S3, no S4, no clicks, no rubs, very brief apical systolic murmur, 3 out of 6 diastolic murmur lasting longer into diastole ABD:  Flat, positive bowel sounds normal in frequency in pitch, no bruits, no rebound, no guarding, no midline pulsatile mass, no hepatomegaly, no splenomegaly EXT:  2 plus pulses throughout, no edema, no cyanosis no clubbing  EKG:  EKG is  ordered today. Sinus rhythm, rate 62, axis within normal limits, intervals within normal limits, no acute ST-T wave changes.   Recent Labs: 06/24/2021: ALT 18; Hemoglobin 13.8; Platelets 303; TSH 0.891 07/05/2021: BUN 14; Creatinine, Ser 0.71; Magnesium 2.1; Potassium 4.6; Sodium 136    Lipid Panel    Component Value Date/Time   CHOL 226 (H) 05/17/2018 0912   TRIG 84.0 05/17/2018 0912   HDL 79.30 05/17/2018 0912   CHOLHDL 3 05/17/2018 0912   VLDL 16.8 05/17/2018 0912   LDLCALC 129 (H) 05/17/2018 0912   LDLDIRECT 131.0 09/03/2009 0813      Wt Readings from Last 3 Encounters:  10/07/21 91 lb 12.8 oz (41.6 kg)  09/29/21 93 lb 12.8 oz (42.5 kg)  09/05/21 95 lb (43.1 kg)      Other studies Reviewed: Additional studies/ records that were reviewed today include: Hospital records,EP records, pulmonary and bronchoscopy results. Review of the above records demonstrates:  Please see elsewhere in the note.     ASSESSMENT AND PLAN:    Aortic insufficiency -  This was still moderate  to  severe on echo in June.  Pumping function and size are normal and I will repeat the echo in June.    Atrial fib-  [name] has a CHA2DS2 - VASc score of 2.  She will remain on anticoagulation and tolerates this.  She seems to have improvement though not complete resolution with her Tikosyn.  No change in therapy.  HTN - The blood pressure is at target.  No change in therapy.   Enlarged aorta - I will follow this with her echocardiogram.  Likely will do CT some point in the future.  I  think we can follow this with the echocardiogram this year and potentially with a CT and maybe coronary angiogram in the future.  Elevated coronary calcium - She does have an elevated coronary calcium score noted on the CT.  This was 231 which was 86 percentile.  She had a negative stress test in 2018.  I will consider a CT of her chest in the future to look at her aorta and at that time might also consider angiography but she is having no new symptoms at this point.   Elevated lipid - Her LDL was elevated and she really wants to avoid statins so we will pursue a nutrition consult to discuss a plant-based diet that does not promote more protein calorie malnutrition which is a big concern with her.    Current medicines are reviewed at length with the patient today.  The patient does not have concerns regarding medicines.  The following changes have been made:  None  Labs/ tests ordered today include: None  Orders Placed This Encounter  Procedures   Amb ref to Medical Nutrition Therapy-MNT   EKG 12-Lead   ECHOCARDIOGRAM COMPLETE      Disposition:   FU with me in 6 months.    Signed, Minus Breeding, MD  10/07/2021 11:16 AM    [place]

## 2021-10-07 ENCOUNTER — Other Ambulatory Visit: Payer: Self-pay

## 2021-10-07 ENCOUNTER — Encounter: Payer: Self-pay | Admitting: Cardiology

## 2021-10-07 ENCOUNTER — Ambulatory Visit: Payer: Medicare Other | Admitting: Cardiology

## 2021-10-07 VITALS — BP 118/62 | HR 61 | Ht 64.0 in | Wt 91.8 lb

## 2021-10-07 DIAGNOSIS — I480 Paroxysmal atrial fibrillation: Secondary | ICD-10-CM | POA: Diagnosis not present

## 2021-10-07 DIAGNOSIS — I10 Essential (primary) hypertension: Secondary | ICD-10-CM

## 2021-10-07 DIAGNOSIS — I351 Nonrheumatic aortic (valve) insufficiency: Secondary | ICD-10-CM

## 2021-10-07 DIAGNOSIS — R931 Abnormal findings on diagnostic imaging of heart and coronary circulation: Secondary | ICD-10-CM | POA: Diagnosis not present

## 2021-10-07 NOTE — Patient Instructions (Signed)
Medication Instructions:  Your Physician recommend you continue on your current medication as directed.    *If you need a refill on your cardiac medications before your next appointment, please call your pharmacy*  Testing/Procedures: In June 2023 Your physician has requested that you have an echocardiogram. Echocardiography is a painless test that uses sound waves to create images of your heart. It provides your doctor with information about the size and shape of your heart and how well your heart's chambers and valves are working. This procedure takes approximately one hour. There are no restrictions for this procedure.   Follow-Up: At [facility], you and your health needs are our priority.  As part of our continuing mission to provide you with exceptional heart care, we have created designated Provider Care Teams.  These Care Teams include your primary Cardiologist (physician) and Advanced Practice Providers (APPs -  Physician Assistants and Nurse Practitioners) who all work together to provide you with the care you need, when you need it.  We recommend signing up for the patient portal called "MyChart".  Sign up information is provided on this After Visit Summary.  MyChart is used to connect with patients for Virtual Visits (Telemedicine).  Patients are able to view lab/test results, encounter notes, upcoming appointments, etc.  Non-urgent messages can be sent to your provider as well.   To learn more about what you can do with MyChart, go to [email or web address].    Your next appointment:    After echo in June  The format for your next appointment:   In Person  Provider:   Minus Breeding, MD

## 2021-10-17 NOTE — Progress Notes (Addendum)
PCP:  Burnis Medin, MD Primary Cardiologist: Minus Breeding, MD Electrophysiologist: Thompson Grayer, MD   [name] [name] is a 70 y.o. female seen today for Thompson Grayer, MD for routine electrophysiology followup.  Since last being seen in our clinic the patient reports doing well overall. She has brief (1-3 hour) episodes about 3-6 times a month. Overall feels well controlled. Going to see a nutritionist as she had significant weight loss after switching to a mostly plant based diet.  she denies chest pain, dyspnea, PND, orthopnea, nausea, vomiting, dizziness, syncope, edema, weight gain, or early satiety.  Past Medical History:  Diagnosis Date   Allergic rhinitis    Aortic insufficiency    CAT SCRATCH 09/10/2009   Qualifier: Diagnosis of  By: Regis Bill MD, [name]    Clavicle fracture 12/2006   left   Diverticulosis    HTN (hypertension)    Osteopenia    Paroxysmal atrial fibrillation (HCC)    Pneumonia    PONV (postoperative nausea and vomiting)    Vision disturbance 03/13/2013   Negative neuro MRA MRI   Past Surgical History:  Procedure Laterality Date   ABDOMINAL HYSTERECTOMY     pt denies 04/15/14   APPENDECTOMY     ATRIAL FIBRILLATION ABLATION N/A 02/01/2021   Procedure: ATRIAL FIBRILLATION ABLATION;  Surgeon: Thompson Grayer, MD;  Location: [facility];  Service: Cardiovascular;  Laterality: N/A;   BRONCHIAL WASHINGS  09/05/2021   Procedure: BRONCHIAL WASHINGS;  Surgeon: Collene Gobble, MD;  Location: [facility];  Service: Cardiopulmonary;;   LAPAROSCOPIC SALPINGO OOPHERECTOMY Right    TONSILLECTOMY AND ADENOIDECTOMY     VIDEO BRONCHOSCOPY N/A 09/05/2021   Procedure: VIDEO BRONCHOSCOPY WITHOUT FLUORO;  Surgeon: Collene Gobble, MD;  Location: [facility];  Service: Cardiopulmonary;  Laterality: N/A;    Current Outpatient Medications  Medication Sig Dispense Refill   acetaminophen (TYLENOL) 500 MG tablet Take 1,000 mg by mouth every 6 (six) hours as needed for  moderate pain.     alendronate (FOSAMAX) 70 MG tablet Take 1 tablet (70 mg total) by mouth every Saturday. TAKE 1 TABLET BY MOUTH ONCE A WEEK WITH A FULL GLASS OF WATER ON AN EMPTY STOMACH     apixaban (ELIQUIS) 5 MG TABS tablet TAKE 1 TABLET BY MOUTH  TWICE DAILY 180 tablet 1   Calcium Carb-Cholecalciferol (CALCIUM+D3 PO) Take 1 tablet by mouth daily with supper.     diltiazem (CARDIZEM) 30 MG tablet Take 1 tablet every 4 hours AS NEEDED for breakthrough AFIB 45 tablet 1   diltiazem (TIAZAC) 300 MG 24 hr capsule Take 1 capsule (300 mg total) by mouth daily. 90 capsule 3   dofetilide (TIKOSYN) 250 MCG capsule Take 1 capsule (250 mcg total) by mouth 2 (two) times daily. 180 capsule 3   LORazepam (ATIVAN) 0.5 MG tablet Take 0.5 mg by mouth daily as needed for anxiety (when flying).     metoprolol tartrate (LOPRESSOR) 25 MG tablet TAKE 1 TABLET BY MOUTH  TWICE DAILY 180 tablet 1   Multiple Vitamins-Minerals (PRESERVISION AREDS PO) Take 1 capsule by mouth in the morning and at bedtime.     potassium chloride (KLOR-CON) 10 MEQ tablet Take 2 tablets (20 mEq total) by mouth daily. 180 tablet 3   No current facility-administered medications for this visit.    Allergies  Allergen Reactions   Latex Hives   Other Rash and Other (See Comments)    Adhesive pads left on skin for extended periods =  BURNS AND RASHES ON THE SKIN!!!!   Codeine Phosphate Other (See Comments)    Drowsiness    Demerol [Meperidine] Nausea And Vomiting   Protonix [Pantoprazole] Rash   Sulfa Antibiotics Rash   Tens Therapy Replace Back Pads Itching and Rash    Social History   Socioeconomic History   Marital status: Married    Spouse name: Not on file   Number of children: 0   Years of education: Not on file   Highest education level: Not on file  Occupational History   Occupation: Veterinary surgeon   Occupation: REPORTER    Employer: [employer]  Tobacco Use   Smoking status: Never   Smokeless tobacco: Never   Substance and Sexual Activity   Alcohol use: Not Currently   Drug use: No   Sexual activity: Not on file  Other Topics Concern   Not on file  Social History Narrative   Married   hh of 2    p0g0   Works for [employer]    Social Determinants of Radio broadcast assistant Strain: Low Risk    Difficulty of Paying Living Expenses: Not hard at all  Food Insecurity: Not on file  Transportation Needs: No Transportation Needs   Lack of Transportation (Medical): No   Lack of Transportation (Non-Medical): No  Physical Activity: Not on file  Stress: Not on file  Social Connections: Not on file  Intimate Partner Violence: Not on file     Review of Systems: All other systems reviewed and are otherwise negative except as noted above.  Physical Exam: Vitals:   10/18/21 0943  BP: (!) 112/58  Pulse: 69  SpO2: 98%  Weight: 93 lb (42.2 kg)  Height: 5' 4.5" (1.638 m)    GEN- The patient is well appearing, alert and oriented x 3 today.   HEENT: normocephalic, atraumatic; sclera clear, conjunctiva pink; hearing intact; oropharynx clear; neck supple, no JVP Lymph- no cervical lymphadenopathy Lungs- Clear to ausculation bilaterally, normal work of breathing.  No wheezes, rales, rhonchi Heart- Regular rate and rhythm, no murmurs, rubs or gallops, PMI not laterally displaced GI- soft, non-tender, non-distended, bowel sounds present, no hepatosplenomegaly Extremities- no clubbing, cyanosis, or edema; DP/PT/radial pulses 2+ bilaterally MS- no significant deformity or atrophy Skin- warm and dry, no rash or lesion Psych- euthymic mood, full affect Neuro- strength and sensation are intact  EKG is ordered. Personal review of EKG from today showed NSR at 67 bpm with stable QTc at 473 ms  Additional studies reviewed include: Previous EP and gen card notes.   Assessment and Plan:  1. Paroxysmal afib S/p ablation 02/01/21, multiple atrial flutter circuits noted. Patient is s/p  dofetilide admission 8/2-06/03/21. Continue dofetilide 250 mcg BID. QTc ~460-470 10/07/2021 Continue Eliquis 5 mg BID Continue metoprolol 25 mg BID Continue diltiazem 300 mg daily with 30 mg PRN q 4 hours for heart racing. Reassurance given that her breakthrough during COVID infection was not unexpected, nor does it indicate medication failure.    2. CHA2DS2VASc score of at least 3 Continue eliquis 5 mg bid   Labs today.   3. AI If AV repair is indicated, could consider MAZE at that time. Dr. Percival Spanish following. Plan to update Echo -> then potentially CT +/- coronary angiogram   4. HTN Stable on current regimen   Follow up with EP APP in 6 months   Shirley Friar, [place]  10/18/21 9:59 AM

## 2021-10-18 ENCOUNTER — Other Ambulatory Visit: Payer: Self-pay

## 2021-10-18 ENCOUNTER — Ambulatory Visit: Payer: Medicare Other | Admitting: Student

## 2021-10-18 ENCOUNTER — Encounter: Payer: Self-pay | Admitting: Student

## 2021-10-18 VITALS — BP 112/58 | HR 69 | Ht 64.5 in | Wt 93.0 lb

## 2021-10-18 DIAGNOSIS — I351 Nonrheumatic aortic (valve) insufficiency: Secondary | ICD-10-CM

## 2021-10-18 DIAGNOSIS — I10 Essential (primary) hypertension: Secondary | ICD-10-CM | POA: Diagnosis not present

## 2021-10-18 DIAGNOSIS — I480 Paroxysmal atrial fibrillation: Secondary | ICD-10-CM | POA: Diagnosis not present

## 2021-10-18 NOTE — Patient Instructions (Signed)
Medication Instructions:  Your physician recommends that you continue on your current medications as directed. Please refer to the Current Medication list given to you today.  *If you need a refill on your cardiac medications before your next appointment, please call your pharmacy*   Lab Work: TODAY: BMET, Mag  If you have labs (blood work) drawn today and your tests are completely normal, you will receive your results only by: [place] (if you have MyChart) OR A paper copy in the mail If you have any lab test that is abnormal or we need to change your treatment, we will call you to review the results.   Follow-Up: At [facility], you and your health needs are our priority.  As part of our continuing mission to provide you with exceptional heart care, we have created designated Provider Care Teams.  These Care Teams include your primary Cardiologist (physician) and Advanced Practice Providers (APPs -  Physician Assistants and Nurse Practitioners) who all work together to provide you with the care you need, when you need it.   Your next appointment:   6 month(s)  The format for your next appointment:   In Person  Provider:   Legrand Como "Oda Kilts, PA-C

## 2021-10-19 LAB — ACID FAST CULTURE WITH REFLEXED SENSITIVITIES (MYCOBACTERIA): Acid Fast Culture: NEGATIVE

## 2021-10-19 LAB — BASIC METABOLIC PANEL
BUN/Creatinine Ratio: 26 (ref 12–28)
BUN: 18 mg/dL (ref 8–27)
CO2: 27 mmol/L (ref 20–29)
Calcium: 9.4 mg/dL (ref 8.7–10.3)
Chloride: 102 mmol/L (ref 96–106)
Creatinine, Ser: 0.69 mg/dL (ref 0.57–1.00)
Glucose: 94 mg/dL (ref 70–99)
Potassium: 4.6 mmol/L (ref 3.5–5.2)
Sodium: 142 mmol/L (ref 134–144)
eGFR: 94 mL/min/{1.73_m2} (ref >59)

## 2021-10-19 LAB — MAGNESIUM: Magnesium: 2.1 mg/dL (ref 1.6–2.3)

## 2021-10-30 ENCOUNTER — Encounter: Payer: Self-pay | Admitting: Internal Medicine

## 2021-10-31 ENCOUNTER — Telehealth: Payer: Self-pay | Admitting: Primary Care

## 2021-10-31 NOTE — Telephone Encounter (Signed)
She has had a persistent cough from covid/pneumonia. She will be seeing Dr. Lamonte Sakai tomorrow at [facility] to review bronchoscopy results. She now has head cold symptoms. She has a stuffy nose and sneezing. She is not currently taking anything over the counter. She has tried mucinex without significant improvement. Home covid test was negative. Recommend she start either Claritin or Zyrtec and flonase. Also ok to take 500mg  Vitamin C. She can come into tomorrows visit as long as she remains covid negative and afebrile.

## 2021-11-01 ENCOUNTER — Other Ambulatory Visit: Payer: Self-pay

## 2021-11-01 ENCOUNTER — Ambulatory Visit: Payer: Medicare Other | Admitting: Emergency Medicine

## 2021-11-01 ENCOUNTER — Encounter: Payer: Self-pay | Admitting: Emergency Medicine

## 2021-11-01 DIAGNOSIS — J069 Acute upper respiratory infection, unspecified: Secondary | ICD-10-CM

## 2021-11-01 DIAGNOSIS — J301 Allergic rhinitis due to pollen: Secondary | ICD-10-CM

## 2021-11-01 DIAGNOSIS — J479 Bronchiectasis, uncomplicated: Secondary | ICD-10-CM | POA: Diagnosis not present

## 2021-11-01 NOTE — Assessment & Plan Note (Signed)
Added scheduled antihistamine

## 2021-11-01 NOTE — Progress Notes (Signed)
Subjective:    Patient ID: [name] [name], female    DOB: 1951/06/27, 71 y.o.   MRN: [id number]  HPI  71 year old never smoker with history of paroxysmal atrial fibrillation post ablation (Dr. Rayann Heman) and recently started on Tikosyn, hypertension, aortic insufficiency, diverticular disease, allergic rhinitis. She was diagnosed with COVID-19 06/12/2021 with symptoms consisting of sore throat and some URI sx.  She was ordered mulnupiravir on 8/15 but couldn't take it do to possible drug interactions.  She was in the emergency department 06/24/2021 with cough, poor appetite and poor p.o. intake, fatigue, some sputum production, confirmed COVID positive.  Chest X revealed bilateral patchy areas of interstitial prominence right greater than left.  She was treated empirically with antibiotics for possible superimposed bacterial pneumonia.  Today she reports that she is improved some, has a bit better energy but not back to her prior baseline. Her URI sx are better, still has a bit of cough, some clear mucous. Sore throat better. Kept her sense of taste and smell. She did lose some wt - about 10 lbs.   ROV 08/09/21 --follow-up visit for 71 year old woman with A. fib, hypertension, AI, allergic rhinitis.  I saw her 1 month ago after she was diagnosed with COVID-19 in mid August.  She had patchy bilateral infiltrates on her chest x-ray.  She returns today reporting that she is continuing to have cough, dry. She has some L upper rib pain extending anteriorly under L breast > improved. Had been associated w cough to some degree. Rib films have been ordered buy t they couldn't be done here. Denies any GERD or PND, no allergies.   Chest x-ray from today reviewed by me shows some clearing of interstitial prominence noted on her film from 06/24/2021.  Persistent hyperinflation and large lung volumes.   ROV 08/30/21 --follow-up visit 71 year old never smoker with paroxysmal A. fib (ablated), hypertension, allergic  rhinitis.  I saw her for persistent cough that started after she had COVID-19 in mid August.  Has been nonproductive.  She has not had any GERD or PND symptoms, no other clear exacerbating factors.  We ordered pulmonary function testing and CT chest as below.  The PFT have not been done. She is still coughing, no fevers or sweats, some clear phlegm. Has lost some wt overt the last 3 months (on a new plan based diet), 15 lbs. She has had some back pain.   High-resolution CT scan of the chest 08/25/2021 reviewed by me, showed no evidence of ILD, widespread cylindrical bronchiectasis and peripheral bronchiolectasis with some peribronchovascular micro micro nodularity suggestive of atypical mycobacterial disease  ROV 11/01/21 --71 year old never smoker followed for cough and cylindrical bronchiectasis noted on CT scan of the chest.  Past medical history also significant for hypertension, A. fib (ablated), allergic rhinitis.  She had COVID-19 in mid August 2022. She underwent bronchoscopy on 09/05/2021.  Bacterial fungal and AFB cultures all negative. PFT have not been done She developed URI symptoms beginning about 5 days ago - no known exposures. COVID negative. No fever clear head mucous.     Review of Systems As per HPI      Objective:   Physical Exam Vitals:   11/01/21 0856  BP: 114/70  Pulse: 69  SpO2: 99%  Weight: 97 lb 1.6 oz (44 kg)  Height: 5' 4.5" (1.638 m)    Gen: Pleasant, thin, in no distress,  normal affect  ENT: No lesions,  mouth clear,  oropharynx clear, no postnasal drip  Neck: No JVD, no stridor  Lungs: No use of accessory muscles, no crackles or wheezing on normal respiration, no wheeze on forced expiration  Cardiovascular: RRR, heart sounds normal, no murmur or gallops, no peripheral edema  Musculoskeletal: No deformities, no cyanosis or clubbing  Neuro: alert, awake, non focal  Skin: Warm, no lesions or rash      Assessment & Plan:   URI (upper  respiratory infection) Symptoms are limited to her head, sinuses for now.  At risk for evolving bronchitis given her bronchiectasis on CT.  Talked about the symptoms of her to watch for.  For now we will manage symptomatically, added scheduled antihistamine therapy.  Allergic rhinitis due to allergen Added scheduled antihistamine  Bronchiectasis without complication ([place])  Please call our office if you begin to have cough that is productive of sputum from the chest, yellow mucus, fever.  If these symptoms evolve then we will need to treat you with an antibiotic. We will likely repeat your CT scan of the chest later this year.  We can talk about the timing a next visit. I would like for you to have pulmonary function testing once you have recovered from this acute infection.  We will work on scheduling this for your next visit. Follow Dr. Lamonte Sakai in 3 months with PFT on the same day.    Baltazar Apo, MD, PhD 11/01/2021, 9:23 AM  Pulmonary and Critical Care [phone] or if no answer before 7:00PM call [phone] For any issues after 7:00PM please call eLink [phone]

## 2021-11-01 NOTE — Patient Instructions (Addendum)
Try using Tylenol Cold and flu for symptom relief while you have your upper respiratory infection.  Use as directed Try starting loratadine 10 mg once daily every day (for allergies and congestion).  You can get this most affordably at either the [facility] or [facility] outpatient pharmacies. Okay to use your chlorpheniramine allergy tablet up to every 6 hours as needed for persistent symptoms.  This is often helpful at bedtime because it has a slight sedating quality Please call our office if you begin to have cough that is productive of sputum from the chest, yellow mucus, fever.  If these symptoms evolve then we will need to treat you with an antibiotic. We will likely repeat your CT scan of the chest later this year.  We can talk about the timing a next visit. I would like for you to have pulmonary function testing once you have recovered from this acute infection.  We will work on scheduling this for your next visit. Follow Dr. Lamonte Sakai in 3 months with PFT on the same day.

## 2021-11-01 NOTE — Assessment & Plan Note (Signed)
Symptoms are limited to her head, sinuses for now.  At risk for evolving bronchitis given her bronchiectasis on CT.  Talked about the symptoms of her to watch for.  For now we will manage symptomatically, added scheduled antihistamine therapy.

## 2021-11-01 NOTE — Assessment & Plan Note (Signed)
°  Please call our office if you begin to have cough that is productive of sputum from the chest, yellow mucus, fever.  If these symptoms evolve then we will need to treat you with an antibiotic. We will likely repeat your CT scan of the chest later this year.  We can talk about the timing a next visit. I would like for you to have pulmonary function testing once you have recovered from this acute infection.  We will work on scheduling this for your next visit. Follow Dr. Lamonte Sakai in 3 months with PFT on the same day.

## 2021-11-03 NOTE — Telephone Encounter (Signed)
Sorry you are sick again  No targeted medicine for sneezing from a cold  however can try   otc antihistamine  ( no decongestant)  such as claritin or zyrtec . Not sure would work for you.  But can whelp if allergy is involved.

## 2021-11-04 ENCOUNTER — Telehealth: Payer: Self-pay | Admitting: Pharmacist

## 2021-11-04 NOTE — Chronic Care Management (AMB) (Signed)
Chronic Care Management Pharmacy Assistant   Name: [name] [name]  MRN: [id number] DOB: Aug 05, 1951  Reason for Encounter: Disease State / Hypertension Assessment Call   Conditions to be addressed/monitored: HTN   Recent office visits:  None  Recent consult visits:  11/01/2021 Baltazar Apo MD (pulmonary) - Patient was seen for viral upper respiratory tract infection and additional issues. No medication changes. Noted to try using Tylenol cold and flu and try starting Loratadine 10mg  daily. Follow up in 3 months.   10/18/2021 Barrington Ellison PA-C [facility]) - Patient was seen for paroxysmal artial fibrillation and additional issues. Discontinued Tessalon. Follow up in 6 months.   10/07/2021 Minus Breeding (Cardiology) - Patient was seen for nonrheumatic aortic valve insufficiency and additional issues.  No medication changes. Follow up after echo in June.  09/29/2021 Philemon Kingdom MD (internal med) - Patient was seen for age related osteoporosis and an additional issue. No medication changes. Follow up in 1 year.  09/06/2021 Deloria Lair MD (opthalmology) - Patient was seen for left epiretinal membrane and additional issues.  No medication changes. Follow up in 1 year.  08/30/2021 Baltazar Apo MD (pulmonary) - Patient was seen for Chronic cough and an additional issue. No medication changes. Follow up in 2 months.  08/17/2021 Glennon Mac DO (sport med) - Patient was seen for acute bilateral low back pain with left side sciatica and an additional issue. Started on Meloxicam 7.5 mg daily. Follow up in 2 weeks if no improvement.  Hospital visits:  None  Medications: Outpatient Encounter Medications as of 11/04/2021  Medication Sig   acetaminophen (TYLENOL) 500 MG tablet Take 1,000 mg by mouth every 6 (six) hours as needed for moderate pain.   alendronate (FOSAMAX) 70 MG tablet Take 1 tablet (70 mg total) by mouth every Saturday. TAKE 1 TABLET BY MOUTH ONCE A WEEK WITH A  FULL GLASS OF WATER ON AN EMPTY STOMACH   apixaban (ELIQUIS) 5 MG TABS tablet TAKE 1 TABLET BY MOUTH  TWICE DAILY   Calcium Carb-Cholecalciferol (CALCIUM+D3 PO) Take 1 tablet by mouth daily with supper.   diltiazem (CARDIZEM) 30 MG tablet Take 1 tablet every 4 hours AS NEEDED for breakthrough AFIB   diltiazem (TIAZAC) 300 MG 24 hr capsule Take 1 capsule (300 mg total) by mouth daily.   dofetilide (TIKOSYN) 250 MCG capsule Take 1 capsule (250 mcg total) by mouth 2 (two) times daily.   LORazepam (ATIVAN) 0.5 MG tablet Take 0.5 mg by mouth daily as needed for anxiety (when flying).   metoprolol tartrate (LOPRESSOR) 25 MG tablet TAKE 1 TABLET BY MOUTH  TWICE DAILY   Multiple Vitamins-Minerals (PRESERVISION AREDS PO) Take 1 capsule by mouth in the morning and at bedtime.   potassium chloride (KLOR-CON) 10 MEQ tablet Take 2 tablets (20 mEq total) by mouth daily.   No facility-administered encounter medications on file as of 11/04/2021.  Fill History: ALENDRONATE SODIUM  70 MG TABS 10/13/2021 84   ELIQUIS  5 MG TABS 09/27/2021 90   DOFETILIDE  250 MCG CAPS 10/20/2021 90   METOPROLOL TARTRATE  25 MG TABS 09/27/2021 90   POTASSIUM CHLORIDE ER  10 MEQ TBCR 09/13/2021 90   DILTIAZEM HYDROCHLORIDE ER  300 MG CP24 08/16/2021 90   Reviewed chart prior to disease state call. Spoke with patient regarding BP  Recent Office Vitals: BP Readings from Last 3 Encounters:  11/01/21 114/70  10/18/21 (!) 112/58  10/07/21 118/62   Pulse Readings from Last 3 Encounters:  11/01/21 69  10/18/21 69  10/07/21 61    Wt Readings from Last 3 Encounters:  11/01/21 97 lb 1.6 oz (44 kg)  10/18/21 93 lb (42.2 kg)  10/07/21 91 lb 12.8 oz (41.6 kg)     Kidney Function Lab Results  Component Value Date/Time   CREATININE 0.69 10/18/2021 10:10 AM   CREATININE 0.71 07/05/2021 10:09 AM   CREATININE 0.82 09/28/2020 08:22 AM   CREATININE 0.75 11/20/2019 08:06 AM   GFR 84.89 05/17/2018 09:12 AM   GFRNONAA >60  06/24/2021 10:54 AM   GFRNONAA 74 09/28/2020 08:22 AM   GFRAA 86 09/28/2020 08:22 AM    BMP Latest Ref Rng & Units 10/18/2021 07/05/2021 06/24/2021  Glucose 70 - 99 mg/dL 94 90 124(H)  BUN 8 - 27 mg/dL 18 14 15   Creatinine 0.57 - 1.00 mg/dL 0.69 0.71 0.69  BUN/Creat Ratio 12 - 28 26 20  -  Sodium 134 - 144 mmol/L 142 136 131(L)  Potassium 3.5 - 5.2 mmol/L 4.6 4.6 4.2  Chloride 96 - 106 mmol/L 102 97 100  CO2 20 - 29 mmol/L 27 23 23   Calcium 8.7 - 10.3 mg/dL 9.4 9.5 8.7(L)    Current antihypertensive regimen:  Cardizem 30 mg - take 1 tablet every 4 hours as needed for breakthrough Afib. Tiazac 300 mg daily Metoprolol 25 mg daily Tikosyn 250 mcg twice daily  How often are you checking your Blood Pressure?  Patient checks every 2 weeks.  Current home BP readings: Patient checked her blood pressure at our call, her reading was 100/65 pulse 70, she states her readings have improved since starting Tikosyn.  What recent interventions/DTPs have been made by any provider to improve Blood Pressure control since last CPP Visit: No recent interventions.   Any recent hospitalizations or ED visits since last visit with CPP? No recent hospital visits.  What diet changes have been made to improve Blood Pressure Control?  Patient states she was doing a plant based diet which caused her to loose a lot of weight and muscle mass.  She will be seeing a nutritionist soon.  For breakfast she will have a blueberry muffin, 1/2 banana, for snack she will have water crackers with peanut butter, lunch today she had a 1/3 fish tacco and sometimes tuna, for dinner she will eat a plant based meal, she has been trying some new recipes from a plant based magazine.   What exercise is being done to improve your Blood Pressure Control?  Patient is currently walking a little and some weight lifting.    Adherence Review: Is the patient currently on ACE/ARB medication? No Does the patient have >5 day gap between last  estimated fill dates? No   Care Gaps: AWV - previous message sent to Ramond Craver Last BP -  TDAP - overdue  Star Rating Drugs: None  [facility] Pharmacist Assistant [phone]

## 2021-11-07 ENCOUNTER — Telehealth: Payer: Self-pay | Admitting: Internal Medicine

## 2021-11-07 NOTE — Telephone Encounter (Signed)
Patient called and is requesting paper copies of D3 lab results & most recent bone density (2021). Patient advise that she believe she saw labs but not bone density on MyChart  Patient advises that she can pick up or they can be mailed just call her at [phone] and let he know which method is preferred.

## 2021-11-08 NOTE — Telephone Encounter (Signed)
Scans mailed out. Pt advised via MyChart.

## 2021-11-08 NOTE — Telephone Encounter (Signed)
Printed - we can mail them to her.

## 2021-11-28 ENCOUNTER — Other Ambulatory Visit: Payer: Self-pay | Admitting: Internal Medicine

## 2021-11-28 NOTE — Telephone Encounter (Signed)
Eliquis 5 mg refill request received. Patient is 71 years old, weight- 44 kg, Crea- 0.69 on 10/18/21, Diagnosis- PAF, and last seen by Barrington Ellison, PA on 10/18/21. Dose is appropriate based on dosing criteria. Will send in refill to requested pharmacy.

## 2021-12-05 ENCOUNTER — Ambulatory Visit (INDEPENDENT_AMBULATORY_CARE_PROVIDER_SITE_OTHER): Payer: Medicare Other

## 2021-12-05 VITALS — BP 100/60 | HR 61 | Temp 98.6°F | Ht 64.0 in | Wt 92.4 lb

## 2021-12-05 DIAGNOSIS — Z0000 Encounter for general adult medical examination without abnormal findings: Secondary | ICD-10-CM

## 2021-12-05 NOTE — Progress Notes (Signed)
Subjective:   NAELA NODAL is a 71 y.o. female who presents for Medicare Annual (Subsequent) preventive examination.  Review of Systems     Cardiac Risk Factors include: advanced age (>90men, >45 women);hypertension     Objective:    Today's Vitals   12/05/21 0857  BP: 100/60  Pulse: 61  Temp: 98.6 F (37 C)  TempSrc: Oral  SpO2: 98%  Weight: 92 lb 6.4 oz (41.9 kg)  Height: 5\' 4"  (1.626 m)   Body mass index is 15.86 kg/m.  Advanced Directives 12/05/2021 09/05/2021 06/24/2021 06/01/2021 02/01/2021  Does Patient Have a Medical Advance Directive? Yes Yes No Yes Yes  Type of Paramedic of [place];Living will [place];Living will - [place];Living will [place];Living will  Does patient want to make changes to medical advance directive? No - Patient declined - - No - Patient declined No - Patient declined  Copy of [place] in Chart? No - copy requested No - copy requested - Yes - validated most recent copy scanned in chart (See row information) No - copy requested    Current Medications (verified) Outpatient Encounter Medications as of 12/05/2021  Medication Sig   acetaminophen (TYLENOL) 500 MG tablet Take 1,000 mg by mouth every 6 (six) hours as needed for moderate pain.   alendronate (FOSAMAX) 70 MG tablet Take 1 tablet (70 mg total) by mouth every Saturday. TAKE 1 TABLET BY MOUTH ONCE A WEEK WITH A FULL GLASS OF WATER ON AN EMPTY STOMACH   apixaban (ELIQUIS) 5 MG TABS tablet TAKE 1 TABLET BY MOUTH  TWICE DAILY   Calcium Carb-Cholecalciferol (CALCIUM+D3 PO) Take 1 tablet by mouth daily with supper.   diltiazem (CARDIZEM) 30 MG tablet Take 1 tablet every 4 hours AS NEEDED for breakthrough AFIB   diltiazem (TIAZAC) 300 MG 24 hr capsule Take 1 capsule (300 mg total) by mouth daily.   dofetilide (TIKOSYN) 250 MCG capsule Take 1 capsule (250 mcg total) by mouth 2 (two) times daily.    LORazepam (ATIVAN) 0.5 MG tablet Take 0.5 mg by mouth daily as needed for anxiety (when flying).   metoprolol tartrate (LOPRESSOR) 25 MG tablet TAKE 1 TABLET BY MOUTH  TWICE DAILY   Multiple Vitamins-Minerals (PRESERVISION AREDS PO) Take 1 capsule by mouth in the morning and at bedtime.   potassium chloride (KLOR-CON) 10 MEQ tablet Take 2 tablets (20 mEq total) by mouth daily.   No facility-administered encounter medications on file as of 12/05/2021.    Allergies (verified) Latex, Other, Codeine phosphate, Demerol [meperidine], Protonix [pantoprazole], Sulfa antibiotics, and Tens therapy replace back pads   History: Past Medical History:  Diagnosis Date   Allergic rhinitis    Aortic insufficiency    CAT SCRATCH 09/10/2009   Qualifier: Diagnosis of  By: Regis Bill MD, [name]    Clavicle fracture 12/2006   left   Diverticulosis    HTN (hypertension)    Osteopenia    Paroxysmal atrial fibrillation (HCC)    Pneumonia    PONV (postoperative nausea and vomiting)    Vision disturbance 03/13/2013   Negative neuro MRA MRI   Past Surgical History:  Procedure Laterality Date   ABDOMINAL HYSTERECTOMY     pt denies 04/15/14   APPENDECTOMY     ATRIAL FIBRILLATION ABLATION N/A 02/01/2021   Procedure: ATRIAL FIBRILLATION ABLATION;  Surgeon: Thompson Grayer, MD;  Location: [facility];  Service: Cardiovascular;  Laterality: N/A;  BRONCHIAL WASHINGS  09/05/2021   Procedure: BRONCHIAL WASHINGS;  Surgeon: Collene Gobble, MD;  Location: [facility] ENDOSCOPY;  Service: Cardiopulmonary;;   LAPAROSCOPIC SALPINGO OOPHERECTOMY Right    TONSILLECTOMY AND ADENOIDECTOMY     VIDEO BRONCHOSCOPY N/A 09/05/2021   Procedure: VIDEO BRONCHOSCOPY WITHOUT FLUORO;  Surgeon: Collene Gobble, MD;  Location: [facility];  Service: Cardiopulmonary;  Laterality: N/A;   Family History  Problem Relation Age of Onset   Scleroderma Mother    Hyperlipidemia Mother    Coronary artery disease Father 50   Esophageal cancer  Paternal Grandmother    Social History   Socioeconomic History   Marital status: Married    Spouse name: Not on file   Number of children: 0   Years of education: Not on file   Highest education level: Not on file  Occupational History   Occupation: Veterinary surgeon   Occupation: REPORTER    Employer: [employer]  Tobacco Use   Smoking status: Never   Smokeless tobacco: Never  Substance and Sexual Activity   Alcohol use: Not Currently   Drug use: No   Sexual activity: Not on file  Other Topics Concern   Not on file  Social History Narrative   Married   hh of 2    p0g0   Works for [employer]    Social Determinants of Radio broadcast assistant Strain: Low Risk    Difficulty of Paying Living Expenses: Not hard at all  Food Insecurity: No Food Insecurity   Worried About Charity fundraiser in the Last Year: Never true   Arboriculturist in the Last Year: Never true  Transportation Needs: No Transportation Needs   Lack of Transportation (Medical): No   Lack of Transportation (Non-Medical): No  Physical Activity: Insufficiently Active   Days of Exercise per Week: 4 days   Minutes of Exercise per Session: 30 min  Stress: No Stress Concern Present   Feeling of Stress : Only a little  Social Connections: Moderately Integrated   Frequency of Communication with Friends and Family: More than three times a week   Frequency of Social Gatherings with Friends and Family: More than three times a week   Attends Religious Services: Never   Marine scientist or Organizations: Yes   Attends Music therapist: More than 4 times per year   Marital Status: Married    Clinical Intake:  Pre-visit preparation completed: Yes  Pain : No/denies pain    BMI - recorded: 16.42 Nutritional Status: BMI <19  Underweight Nutritional Risks: None Diabetes: No  How often do you need to have someone help you when you read instructions, pamphlets, or other written  materials from your doctor or pharmacy?: 1 - Never  Diabetic? No  Interpreter Needed?: NoActivities of Daily Living In your present state of health, do you have any difficulty performing the following activities: 12/05/2021 12/01/2021  Hearing? N N  Vision? N N  Difficulty concentrating or making decisions? N N  Walking or climbing stairs? N N  Dressing or bathing? N N  Doing errands, shopping? N N  Preparing Food and eating ? N N  Using the Toilet? N N  In the past six months, have you accidently leaked urine? N N  Do you have problems with loss of bowel control? N N  Managing your Medications? N N  Managing your Finances? N N  Housekeeping or managing your Housekeeping? N N  Some  recent data might be hidden    Patient Care Team: [name], [name], MD as PCP - General Minus Breeding, MD as PCP - Cardiology (Cardiology) Thompson Grayer, MD as PCP - Electrophysiology (Cardiology) Clent Jacks, MD (Ophthalmology) Maia Breslow, MD (Orthopedic Surgery) Minus Breeding, MD as Consulting Physician (Cardiology) Viona Gilmore, [facility] as Pharmacist (Pharmacist)  Indicate any recent Medical Services you may have received from other than [facility] providers in the past year (date may be approximate).     Assessment:   This is a routine wellness examination for Kyana.  Hearing/Vision screen Hearing Screening - Comments:: No difficulty hearing Vision Screening - Comments:: Wears glasses. Followed by Dr Katy Fitch  Dietary issues and exercise activities discussed: Current Exercise Habits: Home exercise routine, Type of exercise: walking, Time (Minutes): 30, Frequency (Times/Week): 4, Weekly Exercise (Minutes/Week): 120, Intensity: Moderate, Exercise limited by: None identified   Goals Addressed               This Visit's Progress     Gain weight (pt-stated)        I would like to gain weight. And have fewer episodes of A-Fib.       Depression Screen PHQ 2/9 Scores 12/05/2021 09/29/2020  05/17/2018  PHQ - 2 Score 0 0 0    Fall Risk Fall Risk  12/05/2021 12/01/2021 09/29/2020 05/17/2018  Falls in the past year? 0 0 0 No  Number falls in past yr: 0 - 0 -  Injury with Fall? 0 - 0 -  Risk for fall due to : No Fall Risks - - -    FALL RISK PREVENTION PERTAINING TO THE HOME:  Any stairs in or around the home? Yes  If so, are there any without handrails? No  Home free of loose throw rugs in walkways, pet beds, electrical cords, etc? Yes  Adequate lighting in your home to reduce risk of falls? Yes   ASSISTIVE DEVICES UTILIZED TO PREVENT FALLS:  Life alert? No  Use of a cane, walker or w/c? No  Grab bars in the bathroom? Yes  Shower chair or bench in shower? Yes  Elevated toilet seat or a handicapped toilet? Yes   TIMED UP AND GO:  Was the test performed? Yes .  Length of time to ambulate 10 feet: 5  sec.   Gait steady and fast without use of assistive device  Cognitive Function:     6CIT Screen 12/05/2021  What Year? 0 points  What month? 0 points  What time? 0 points  Count back from 20 0 points  Months in reverse 0 points  Repeat phrase 0 points  Total Score 0    Immunizations Immunization History  Administered Date(s) Administered   Fluad Quad(high Dose 65+) 09/02/2019, 08/03/2021   Influenza Split 08/25/2011, 09/03/2013, 08/03/2014   Influenza Whole 08/25/2009, 07/30/2010, 07/30/2012   Influenza, High Dose Seasonal PF 07/30/2020   Influenza,inj,Quad PF,6+ Mos 08/17/2015, 07/18/2016, 07/30/2017   Influenza-Unspecified 08/20/2018, 08/09/2019   Moderna Covid-19 Vaccine Bivalent Booster 12yrs & up 09/13/2021   PFIZER(Purple Top)SARS-COV-2 Vaccination 12/05/2019, 12/30/2019, 08/17/2020, 02/25/2021   Pneumococcal Conjugate-13 03/05/2018   Pneumococcal Polysaccharide-23 02/02/2020   Pneumococcal-Unspecified 12/02/2019   Td 05/22/2007   Zoster Recombinat (Shingrix) 03/22/2018, 07/28/2018   Zoster, Live 10/02/2013    TDAP status: Due, Education has been  provided regarding the importance of this vaccine. Advised may receive this vaccine at local pharmacy or Health Dept. Aware to provide a copy of the vaccination record if obtained from local pharmacy  or Health Dept. Verbalized acceptance and understanding.  Flu Vaccine status: Up to date  Pneumococcal vaccine status: Up to date  Covid-19 vaccine status: Completed vaccines  Qualifies for Shingles Vaccine? Yes   Zostavax completed Yes   Shingrix Completed?: Yes  Screening Tests Health Maintenance  Topic Date Due   TETANUS/TDAP  05/21/2017   MAMMOGRAM  08/05/2023   COLONOSCOPY (Pts 45-37yrs Insurance coverage will need to be confirmed)  04/20/2024   Pneumonia Vaccine 36+ Years old  Completed   INFLUENZA VACCINE  Completed   DEXA SCAN  Completed   COVID-19 Vaccine  Completed   Hepatitis C Screening  Completed   Zoster Vaccines- Shingrix  Completed   HPV VACCINES  Aged Out    Health Maintenance  Health Maintenance Due  Topic Date Due   TETANUS/TDAP  05/21/2017    Colorectal cancer screening: Type of screening: Colonoscopy. Completed 04/20/24. Repeat every 10 years  Mammogram status: Completed Yes. Repeat every year  Bone Density status: Completed 09/28/20. Results reflect: Bone density results: OSTEOPOROSIS. Repeat every 10 years.  Additional Screening:  Hepatitis C Screening: does qualify; Completed 09/14/16  Vision Screening: Recommended annual ophthalmology exams for early detection of glaucoma and other disorders of the eye. Is the patient up to date with their annual eye exam?  Yes  Who is the provider or what is the name of the office in which the patient attends annual eye exams? Dr Katy Fitch If pt is not established with a provider, would they like to be referred to a provider to establish care? No .   Dental Screening: Recommended annual dental exams for proper oral hygiene  Community Resource Referral / Chronic Care Management: CRR required this visit?  No   CCM  required this visit?  No      Plan:     I have personally reviewed and noted the following in the patients chart:   Medical and social history Use of alcohol, tobacco or illicit drugs  Current medications and supplements including opioid prescriptions.  Functional ability and status Nutritional status Physical activity Advanced directives List of other physicians Hospitalizations, surgeries, and ER visits in previous 12 months Vitals Screenings to include cognitive, depression, and falls Referrals and appointments  In addition, I have reviewed and discussed with patient certain preventive protocols, quality metrics, and best practice recommendations. A written personalized care plan for preventive services as well as general preventive health recommendations were provided to patient.     Criselda Peaches, LPN   04/06/6719   Nurse Notes: None

## 2021-12-05 NOTE — Patient Instructions (Addendum)
[name] [name] , Thank you for taking time to come for your Medicare Wellness Visit. I appreciate your ongoing commitment to your health goals. Please review the following plan we discussed and let me know if I can assist you in the future.   These are the goals we discussed:  Goals       Gain weight (pt-stated)      I would like to gain weight. And have fewer episodes of A-Fib.        This is a list of the screening recommended for you and due dates:  Health Maintenance  Topic Date Due   Tetanus Vaccine  05/21/2017   Mammogram  08/05/2023   Colon Cancer Screening  04/20/2024   Pneumonia Vaccine  Completed   Flu Shot  Completed   DEXA scan (bone density measurement)  Completed   COVID-19 Vaccine  Completed   Hepatitis C Screening: USPSTF Recommendation to screen - Ages 16-79 yo.  Completed   Zoster (Shingles) Vaccine  Completed   HPV Vaccine  Aged Out    Advanced directives: Yes   Conditions/risks identified: None  Next appointment: Follow up in one year for your annual wellness visit    Preventive Care 65 Years and Older, Female Preventive care refers to lifestyle choices and visits with your health care provider that can promote health and wellness. What does preventive care include? A yearly physical exam. This is also called an annual well check. Dental exams once or twice a year. Routine eye exams. Ask your health care provider how often you should have your eyes checked. Personal lifestyle choices, including: Daily care of your teeth and gums. Regular physical activity. Eating a healthy diet. Avoiding tobacco and drug use. Limiting alcohol use. Practicing safe sex. Taking low-dose aspirin every day. Taking vitamin and mineral supplements as recommended by your health care provider. What happens during an annual well check? The services and screenings done by your health care provider during your annual well check will depend on your age, overall health, lifestyle  risk factors, and family history of disease. Counseling  Your health care provider may ask you questions about your: Alcohol use. Tobacco use. Drug use. Emotional well-being. Home and relationship well-being. Sexual activity. Eating habits. History of falls. Memory and ability to understand (cognition). Work and work Statistician. Reproductive health. Screening  You may have the following tests or measurements: Height, weight, and BMI. Blood pressure. Lipid and cholesterol levels. These may be checked every 5 years, or more frequently if you are over 73 years old. Skin check. Lung cancer screening. You may have this screening every year starting at age 74 if you have a 30-pack-year history of smoking and currently smoke or have quit within the past 15 years. Fecal occult blood test (FOBT) of the stool. You may have this test every year starting at age 47. Flexible sigmoidoscopy or colonoscopy. You may have a sigmoidoscopy every 5 years or a colonoscopy every 10 years starting at age 91. Hepatitis C blood test. Hepatitis B blood test. Sexually transmitted disease (STD) testing. Diabetes screening. This is done by checking your blood sugar (glucose) after you have not eaten for a while (fasting). You may have this done every 1-3 years. Bone density scan. This is done to screen for osteoporosis. You may have this done starting at age 10. Mammogram. This may be done every 1-2 years. Talk to your health care provider about how often you should have regular mammograms. Talk with your health care  provider about your test results, treatment options, and if necessary, the need for more tests. Vaccines  Your health care provider may recommend certain vaccines, such as: Influenza vaccine. This is recommended every year. Tetanus, diphtheria, and acellular pertussis (Tdap, Td) vaccine. You may need a Td booster every 10 years. Zoster vaccine. You may need this after age 26. Pneumococcal  13-valent conjugate (PCV13) vaccine. One dose is recommended after age 47. Pneumococcal polysaccharide (PPSV23) vaccine. One dose is recommended after age 34. Talk to your health care provider about which screenings and vaccines you need and how often you need them. This information is not intended to replace advice given to you by your health care provider. Make sure you discuss any questions you have with your health care provider. Document Released: 11/12/2015 Document Revised: 07/05/2016 Document Reviewed: 08/17/2015 Elsevier Interactive Patient Education  2017 [place] Prevention in the Home Falls can cause injuries. They can happen to people of all ages. There are many things you can do to make your home safe and to help prevent falls. What can I do on the outside of my home? Regularly fix the edges of walkways and driveways and fix any cracks. Remove anything that might make you trip as you walk through a door, such as a raised step or threshold. Trim any bushes or trees on the path to your home. Use bright outdoor lighting. Clear any walking paths of anything that might make someone trip, such as rocks or tools. Regularly check to see if handrails are loose or broken. Make sure that both sides of any steps have handrails. Any raised decks and porches should have guardrails on the edges. Have any leaves, snow, or ice cleared regularly. Use sand or salt on walking paths during winter. Clean up any spills in your garage right away. This includes oil or grease spills. What can I do in the bathroom? Use night lights. Install grab bars by the toilet and in the tub and shower. Do not use towel bars as grab bars. Use non-skid mats or decals in the tub or shower. If you need to sit down in the shower, use a plastic, non-slip stool. Keep the floor dry. Clean up any water that spills on the floor as soon as it happens. Remove soap buildup in the tub or shower regularly. Attach  bath mats securely with double-sided non-slip rug tape. Do not have throw rugs and other things on the floor that can make you trip. What can I do in the bedroom? Use night lights. Make sure that you have a light by your bed that is easy to reach. Do not use any sheets or blankets that are too big for your bed. They should not hang down onto the floor. Have a firm chair that has side arms. You can use this for support while you get dressed. Do not have throw rugs and other things on the floor that can make you trip. What can I do in the kitchen? Clean up any spills right away. Avoid walking on wet floors. Keep items that you use a lot in easy-to-reach places. If you need to reach something above you, use a strong step stool that has a grab bar. Keep electrical cords out of the way. Do not use floor polish or wax that makes floors slippery. If you must use wax, use non-skid floor wax. Do not have throw rugs and other things on the floor that can make you trip. What can I  do with my stairs? Do not leave any items on the stairs. Make sure that there are handrails on both sides of the stairs and use them. Fix handrails that are broken or loose. Make sure that handrails are as long as the stairways. Check any carpeting to make sure that it is firmly attached to the stairs. Fix any carpet that is loose or worn. Avoid having throw rugs at the top or bottom of the stairs. If you do have throw rugs, attach them to the floor with carpet tape. Make sure that you have a light switch at the top of the stairs and the bottom of the stairs. If you do not have them, ask someone to add them for you. What else can I do to help prevent falls? Wear shoes that: Do not have high heels. Have rubber bottoms. Are comfortable and fit you well. Are closed at the toe. Do not wear sandals. If you use a stepladder: Make sure that it is fully opened. Do not climb a closed stepladder. Make sure that both sides of the  stepladder are locked into place. Ask someone to hold it for you, if possible. Clearly mark and make sure that you can see: Any grab bars or handrails. First and last steps. Where the edge of each step is. Use tools that help you move around (mobility aids) if they are needed. These include: Canes. Walkers. Scooters. Crutches. Turn on the lights when you go into a dark area. Replace any light bulbs as soon as they burn out. Set up your furniture so you have a clear path. Avoid moving your furniture around. If any of your floors are uneven, fix them. If there are any pets around you, be aware of where they are. Review your medicines with your doctor. Some medicines can make you feel dizzy. This can increase your chance of falling. Ask your doctor what other things that you can do to help prevent falls. This information is not intended to replace advice given to you by your health care provider. Make sure you discuss any questions you have with your health care provider. Document Released: 08/12/2009 Document Revised: 03/23/2016 Document Reviewed: 11/20/2014 Elsevier Interactive Patient Education  2017 [employer].

## 2021-12-08 ENCOUNTER — Encounter: Payer: Self-pay | Admitting: Cardiology

## 2021-12-28 DIAGNOSIS — H0102B Squamous blepharitis left eye, upper and lower eyelids: Secondary | ICD-10-CM | POA: Diagnosis not present

## 2021-12-28 DIAGNOSIS — H353131 Nonexudative age-related macular degeneration, bilateral, early dry stage: Secondary | ICD-10-CM | POA: Diagnosis not present

## 2021-12-28 DIAGNOSIS — H35371 Puckering of macula, right eye: Secondary | ICD-10-CM | POA: Diagnosis not present

## 2021-12-28 DIAGNOSIS — H0102A Squamous blepharitis right eye, upper and lower eyelids: Secondary | ICD-10-CM | POA: Diagnosis not present

## 2021-12-28 DIAGNOSIS — Z961 Presence of intraocular lens: Secondary | ICD-10-CM | POA: Diagnosis not present

## 2021-12-28 DIAGNOSIS — H04123 Dry eye syndrome of bilateral lacrimal glands: Secondary | ICD-10-CM | POA: Diagnosis not present

## 2022-01-10 NOTE — Progress Notes (Signed)
Derm   Chief Complaint  Patient presents with   Annual Exam    Not fasting    HPI: Patient  [name] [name]  71 y.o. comes in today for Preventive Health Care visit    Pulmonary .  Evaluation. Still has cough .   But better   post covid  and resp infection. Lost lots of weighjt during illness .  Seeing nutrition help  and steady now   Cards had advised  plant based diet and couldn't tolerate by preference and not  expanding diet.     ( Paying out of pocket  for consults)  Cards  every 6 mos check    doing ok .  Skin lesion r toe and shin area  new brown macule . Not sure if important  Taking Vit D for bone health.  Health Maintenance  Topic Date Due   TETANUS/TDAP  07/14/2022 (Originally 05/21/2017)   MAMMOGRAM  08/05/2023   COLONOSCOPY (Pts 45-70yrs Insurance coverage will need to be confirmed)  04/20/2024   Pneumonia Vaccine 33+ Years old  Completed   INFLUENZA VACCINE  Completed   DEXA SCAN  Completed   COVID-19 Vaccine  Completed   Hepatitis C Screening  Completed   Zoster Vaccines- Shingrix  Completed   HPV VACCINES  Aged Out   Health Maintenance Review LIFESTYLE:  Exercise:  resumed walking  mile +  Tobacco/ETS: n Alcohol:  n Sugar beverages: n Sleep:about 7 - 7.5  Drug use: no HH of   2  2 pets  Work: sept 30 retired   ROS:   REST of 12 system review negative except as per HPI   Past Medical History:  Diagnosis Date   Allergic rhinitis    Aortic insufficiency    CAT SCRATCH 09/10/2009   Qualifier: Diagnosis of  By: Fabian Sharp MD, Neta Mends    Clavicle fracture 12/2006   left   Diverticulosis    HTN (hypertension)    Osteopenia    Paroxysmal atrial fibrillation (HCC)    Pneumonia    PONV (postoperative nausea and vomiting)    Vision disturbance 03/13/2013   Negative neuro MRA MRI    Past Surgical History:  Procedure Laterality Date   ABDOMINAL HYSTERECTOMY     pt denies 04/15/14   APPENDECTOMY     ATRIAL FIBRILLATION ABLATION N/A 02/01/2021    Procedure: ATRIAL FIBRILLATION ABLATION;  Surgeon: Hillis Range, MD;  Location: [facility];  Service: Cardiovascular;  Laterality: N/A;   BRONCHIAL WASHINGS  09/05/2021   Procedure: BRONCHIAL WASHINGS;  Surgeon: Leslye Peer, MD;  Location: [facility] ENDOSCOPY;  Service: Cardiopulmonary;;   LAPAROSCOPIC SALPINGO OOPHERECTOMY Right    TONSILLECTOMY AND ADENOIDECTOMY     VIDEO BRONCHOSCOPY N/A 09/05/2021   Procedure: VIDEO BRONCHOSCOPY WITHOUT FLUORO;  Surgeon: Leslye Peer, MD;  Location: [facility] ENDOSCOPY;  Service: Cardiopulmonary;  Laterality: N/A;    Family History  Problem Relation Age of Onset   Scleroderma Mother    Hyperlipidemia Mother    Coronary artery disease Father 26   Esophageal cancer Paternal Grandmother     Social History   Socioeconomic History   Marital status: Married    Spouse name: Not on file   Number of children: 0   Years of education: Not on file   Highest education level: Not on file  Occupational History   Occupation: Production manager   Occupation: REPORTER    Employer: [employer]  Tobacco Use   Smoking status: Never  Smokeless tobacco: Never  Substance and Sexual Activity   Alcohol use: Not Currently   Drug use: No   Sexual activity: Not on file  Other Topics Concern   Not on file  Social History Narrative   Married   hh of 2    p0g0   Works for [employer]    Social Determinants of Corporate investment banker Strain: Low Risk    Difficulty of Paying Living Expenses: Not hard at all  Food Insecurity: No Food Insecurity   Worried About Programme researcher, broadcasting/film/video in the Last Year: Never true   Barista in the Last Year: Never true  Transportation Needs: No Transportation Needs   Lack of Transportation (Medical): No   Lack of Transportation (Non-Medical): No  Physical Activity: Insufficiently Active   Days of Exercise per Week: 4 days   Minutes of Exercise per Session: 30 min  Stress: No Stress Concern Present   Feeling of  Stress : Only a little  Social Connections: Moderately Integrated   Frequency of Communication with Friends and Family: More than three times a week   Frequency of Social Gatherings with Friends and Family: More than three times a week   Attends Religious Services: Never   Database administrator or Organizations: Yes   Attends Engineer, structural: More than 4 times per year   Marital Status: Married    Outpatient Medications Prior to Visit  Medication Sig Dispense Refill   acetaminophen (TYLENOL) 500 MG tablet Take 1,000 mg by mouth every 6 (six) hours as needed for moderate pain.     alendronate (FOSAMAX) 70 MG tablet Take 1 tablet (70 mg total) by mouth every Saturday. TAKE 1 TABLET BY MOUTH ONCE A WEEK WITH A FULL GLASS OF WATER ON AN EMPTY STOMACH     apixaban (ELIQUIS) 5 MG TABS tablet TAKE 1 TABLET BY MOUTH  TWICE DAILY 180 tablet 1   Calcium Carb-Cholecalciferol (CALCIUM+D3 PO) Take 1 tablet by mouth daily with supper.     diltiazem (CARDIZEM) 30 MG tablet Take 1 tablet every 4 hours AS NEEDED for breakthrough AFIB 45 tablet 1   diltiazem (TIAZAC) 300 MG 24 hr capsule Take 1 capsule (300 mg total) by mouth daily. 90 capsule 3   dofetilide (TIKOSYN) 250 MCG capsule Take 1 capsule (250 mcg total) by mouth 2 (two) times daily. 180 capsule 3   LORazepam (ATIVAN) 0.5 MG tablet Take 0.5 mg by mouth daily as needed for anxiety (when flying).     metoprolol tartrate (LOPRESSOR) 25 MG tablet TAKE 1 TABLET BY MOUTH  TWICE DAILY 180 tablet 1   Multiple Vitamins-Minerals (PRESERVISION AREDS PO) Take 1 capsule by mouth in the morning and at bedtime.     potassium chloride (KLOR-CON) 10 MEQ tablet Take 2 tablets (20 mEq total) by mouth daily. 180 tablet 3   No facility-administered medications prior to visit.     EXAM:  BP 120/66 (BP Location: Left Arm, Patient Position: Sitting, Cuff Size: Normal)   Pulse (!) 59   Temp 98.1 F (36.7 C) (Oral)   Ht 5\' 4"  (1.626 m)   Wt 91 lb  (41.3 kg)   SpO2 98%   BMI 15.62 kg/m   Body mass index is 15.62 kg/m. Wt Readings from Last 3 Encounters:  01/11/22 91 lb (41.3 kg)  12/05/21 92 lb 6.4 oz (41.9 kg)  11/01/21 97 lb 1.6 oz (44 kg)    Physical Exam:  Vital signs reviewed ZOX:WRUE is a well-developed well-nourished alert cooperative    who appearsr stated age in no acute distress.  HEENT: normocephalic atraumatic , Eyes: PERRL EOM's full, conjunctiva clear, Nares: paten,t no deformity discharge or tenderness., Ears: no deformity EAC's clear TMs with normal landmarks. Mouth masked   NECK: supple without masses, thyromegaly or bruits. CHEST/PULM:  Clear to auscultation and percussion breath sounds equal no wheeze , rales or rhonchi. No chest wall deformities or tenderness. Breast: normal by inspection . No dimpling, discharge, masses, tenderness or discharge . CV: PMI is nondisplaced, S1 S2 no gallops,, rubs.  Faint diastolic decrescendo murmur Peripheral pulses are full without delay.No JVD .  ABDOMEN: Bowel sounds normal nontender  No guard or rebound, no hepato splenomegal no CVA tenderness.   Extremtities:  No clubbing cyanosis or edema, no acute joint swelling or redness no focal atrophy NEURO:  Oriented x3, cranial nerves 3-12 appear to be intact, no obvious focal weakness,gait within normal limits no abnormal reflexes or asymmetrical SKIN: No acute rashes normal turgor, color, no bruising or petechiae. R great toe w flesh colored skin tag like lesion    also  on shin brown irreg macule PSYCH: Oriented, good eye contact, no obvious depression anxiety, cognition and judgment appear normal. LN: no cervical axillary inguinal adenopathy  Lab Results  Component Value Date   WBC 7.6 01/11/2022   HGB 13.3 01/11/2022   HCT 40.4 01/11/2022   PLT 312.0 01/11/2022   GLUCOSE 86 01/11/2022   CHOL 176 01/11/2022   TRIG 81.0 01/11/2022   HDL 73.40 01/11/2022   LDLDIRECT 131.0 09/03/2009   LDLCALC 87 01/11/2022   ALT 14  01/11/2022   AST 19 01/11/2022   NA 137 01/11/2022   K 4.4 01/11/2022   CL 101 01/11/2022   CREATININE 0.68 01/11/2022   BUN 22 01/11/2022   CO2 31 01/11/2022   TSH 1.17 01/11/2022    BP Readings from Last 3 Encounters:  01/11/22 120/66  12/05/21 100/60  11/01/21 114/70    ASSESSMENT AND PLAN:  Discussed the following assessment and plan:    ICD-10-CM   1. Visit for preventive health examination  Z00.00     2. Medication management  Z79.899 Lipid panel    Basic metabolic panel    CBC with Differential/Platelet    Hepatic function panel    TSH    Prealbumin    Prealbumin    TSH    Hepatic function panel    CBC with Differential/Platelet    Basic metabolic panel    Lipid panel    3. Undernourished  E63.9 Lipid panel    Basic metabolic panel    CBC with Differential/Platelet    Hepatic function panel    TSH    Prealbumin    Prealbumin    TSH    Hepatic function panel    CBC with Differential/Platelet    Basic metabolic panel    Lipid panel    4. Essential hypertension  I10 Lipid panel    Basic metabolic panel    CBC with Differential/Platelet    Hepatic function panel    TSH    Prealbumin    Prealbumin    TSH    Hepatic function panel    CBC with Differential/Platelet    Basic metabolic panel    Lipid panel    5. Paroxysmal atrial fibrillation (HCC)  I48.0 Lipid panel    Basic metabolic panel    CBC with Differential/Platelet  Hepatic function panel    TSH    Prealbumin    Prealbumin    TSH    Hepatic function panel    CBC with Differential/Platelet    Basic metabolic panel    Lipid panel    6. Chronic cough  R05.3 Lipid panel    Basic metabolic panel    CBC with Differential/Platelet    Hepatic function panel    TSH    Prealbumin    Prealbumin    TSH    Hepatic function panel    CBC with Differential/Platelet    Basic metabolic panel    Lipid panel    7. Skin lesion  L98.9 Lipid panel    Basic metabolic panel    CBC with  Differential/Platelet    Hepatic function panel    TSH    Prealbumin    Prealbumin    TSH    Hepatic function panel    CBC with Differential/Platelet    Basic metabolic panel    Lipid panel    Ambulatory referral to Dermatology   tag like lesion lr great toe and discolored irreg area right shin area (( could be age macule)     Continue  dietary monitoring  ;suspect illness and diet changes contributed  to weight loss but will follow .  Should improve   Lab today .   Derm referral   .    Return for depending on results how doing with weight  6-12 mos or earlier if needed .  Patient Care Team: Caley Volkert, Neta Mends, MD as PCP - General Rollene Rotunda, MD as PCP - Cardiology (Cardiology) Hillis Range, MD as PCP - Electrophysiology (Cardiology) Ernesto Rutherford, MD (Ophthalmology) Rollene Rotunda, MD as Consulting Physician (Cardiology) Verner Chol, [facility] as Pharmacist (Pharmacist) Patient Instructions  Good to see you today .   Will do referral for dermatology consult.   Agree with nutrition consulting  Plan fu yearly or  4-6 mos if weight  loss     Neta Mends. Lorry Furber M.D.

## 2022-01-11 ENCOUNTER — Ambulatory Visit (INDEPENDENT_AMBULATORY_CARE_PROVIDER_SITE_OTHER): Payer: Medicare Other | Admitting: Internal Medicine

## 2022-01-11 ENCOUNTER — Encounter: Payer: Self-pay | Admitting: Internal Medicine

## 2022-01-11 VITALS — BP 120/66 | HR 59 | Temp 98.1°F | Ht 64.0 in | Wt 91.0 lb

## 2022-01-11 DIAGNOSIS — I10 Essential (primary) hypertension: Secondary | ICD-10-CM

## 2022-01-11 DIAGNOSIS — L989 Disorder of the skin and subcutaneous tissue, unspecified: Secondary | ICD-10-CM | POA: Diagnosis not present

## 2022-01-11 DIAGNOSIS — E639 Nutritional deficiency, unspecified: Secondary | ICD-10-CM | POA: Diagnosis not present

## 2022-01-11 DIAGNOSIS — Z79899 Other long term (current) drug therapy: Secondary | ICD-10-CM

## 2022-01-11 DIAGNOSIS — Z0000 Encounter for general adult medical examination without abnormal findings: Secondary | ICD-10-CM | POA: Diagnosis not present

## 2022-01-11 DIAGNOSIS — R053 Chronic cough: Secondary | ICD-10-CM

## 2022-01-11 DIAGNOSIS — I480 Paroxysmal atrial fibrillation: Secondary | ICD-10-CM

## 2022-01-11 LAB — HEPATIC FUNCTION PANEL
ALT: 14 U/L (ref 0–35)
AST: 19 U/L (ref 0–37)
Albumin: 4.1 g/dL (ref 3.5–5.2)
Alkaline Phosphatase: 71 U/L (ref 39–117)
Bilirubin, Direct: 0 mg/dL (ref 0.0–0.3)
Total Bilirubin: 0.5 mg/dL (ref 0.2–1.2)
Total Protein: 8.2 g/dL (ref 6.0–8.3)

## 2022-01-11 LAB — CBC WITH DIFFERENTIAL/PLATELET
Basophils Absolute: 0.1 10*3/uL (ref 0.0–0.1)
Basophils Relative: 0.8 % (ref 0.0–3.0)
Eosinophils Absolute: 0.1 10*3/uL (ref 0.0–0.7)
Eosinophils Relative: 1.2 % (ref 0.0–5.0)
HCT: 40.4 % (ref 36.0–46.0)
Hemoglobin: 13.3 g/dL (ref 12.0–15.0)
Lymphocytes Relative: 21.7 % (ref 12.0–46.0)
Lymphs Abs: 1.6 10*3/uL (ref 0.7–4.0)
MCHC: 32.8 g/dL (ref 30.0–36.0)
MCV: 95.4 fl (ref 78.0–100.0)
Monocytes Absolute: 0.7 10*3/uL (ref 0.1–1.0)
Monocytes Relative: 9.4 % (ref 3.0–12.0)
Neutro Abs: 5.1 10*3/uL (ref 1.4–7.7)
Neutrophils Relative %: 66.9 % (ref 43.0–77.0)
Platelets: 312 10*3/uL (ref 150.0–400.0)
RBC: 4.24 Mil/uL (ref 3.87–5.11)
RDW: 14.2 % (ref 11.5–15.5)
WBC: 7.6 10*3/uL (ref 4.0–10.5)

## 2022-01-11 LAB — LIPID PANEL
Cholesterol: 176 mg/dL (ref 0–200)
HDL: 73.4 mg/dL (ref >39.00)
LDL Cholesterol: 87 mg/dL (ref 0–99)
NonHDL: 102.81
Total CHOL/HDL Ratio: 2
Triglycerides: 81 mg/dL (ref 0.0–149.0)
VLDL: 16.2 mg/dL (ref 0.0–40.0)

## 2022-01-11 LAB — BASIC METABOLIC PANEL
BUN: 22 mg/dL (ref 6–23)
CO2: 31 mEq/L (ref 19–32)
Calcium: 10.5 mg/dL (ref 8.4–10.5)
Chloride: 101 mEq/L (ref 96–112)
Creatinine, Ser: 0.68 mg/dL (ref 0.40–1.20)
GFR: 88.98 mL/min (ref >60.00)
Glucose, Bld: 86 mg/dL (ref 70–99)
Potassium: 4.4 mEq/L (ref 3.5–5.1)
Sodium: 137 mEq/L (ref 135–145)

## 2022-01-11 LAB — TSH: TSH: 1.17 u[IU]/mL (ref 0.35–5.50)

## 2022-01-11 NOTE — Patient Instructions (Addendum)
Good to see you today .  ? ?Will do referral for dermatology consult.  ? ?Agree with nutrition consulting ? ?Plan fu yearly or  4-6 mos if weight  loss  ? ? ?

## 2022-01-12 LAB — PREALBUMIN: Prealbumin: 20 mg/dL (ref 17–34)

## 2022-01-13 ENCOUNTER — Encounter: Payer: Self-pay | Admitting: Internal Medicine

## 2022-01-15 NOTE — Progress Notes (Signed)
Results normal  ldl improved

## 2022-01-20 NOTE — Telephone Encounter (Signed)
Notified patient that since she regularly access her mychart account she is able to view all her labs through her account using the results tab & show her nutritionist to track trends . Pt appreciative of the advice & will call office if additional assistance needed. Pt denies ques/concerns at this time. ?

## 2022-01-31 ENCOUNTER — Ambulatory Visit: Payer: Medicare Other | Admitting: Emergency Medicine

## 2022-01-31 ENCOUNTER — Encounter: Payer: Self-pay | Admitting: Emergency Medicine

## 2022-01-31 ENCOUNTER — Ambulatory Visit (INDEPENDENT_AMBULATORY_CARE_PROVIDER_SITE_OTHER): Payer: Medicare Other | Admitting: Emergency Medicine

## 2022-01-31 DIAGNOSIS — R053 Chronic cough: Secondary | ICD-10-CM

## 2022-01-31 DIAGNOSIS — J479 Bronchiectasis, uncomplicated: Secondary | ICD-10-CM

## 2022-01-31 LAB — PULMONARY FUNCTION TEST
DL/VA % pred: 136 %
DL/VA: 5.65 ml/min/mmHg/L
DLCO cor % pred: 83 %
DLCO cor: 16.37 ml/min/mmHg
DLCO unc % pred: 83 %
DLCO unc: 16.32 ml/min/mmHg
FEF 25-75 Post: 0.98 L/sec
FEF 25-75 Pre: 0.89 L/sec
FEF2575-%Change-Post: 9 %
FEF2575-%Pred-Post: 50 %
FEF2575-%Pred-Pre: 45 %
FEV1-%Change-Post: 2 %
FEV1-%Pred-Post: 61 %
FEV1-%Pred-Pre: 60 %
FEV1-Post: 1.42 L
FEV1-Pre: 1.39 L
FEV1FVC-%Change-Post: 18 %
FEV1FVC-%Pred-Pre: 92 %
FEV6-%Change-Post: -13 %
FEV6-%Pred-Post: 59 %
FEV6-%Pred-Pre: 68 %
FEV6-Post: 1.72 L
FEV6-Pre: 1.98 L
FEV6FVC-%Pred-Post: 104 %
FEV6FVC-%Pred-Pre: 104 %
FVC-%Change-Post: -13 %
FVC-%Pred-Post: 56 %
FVC-%Pred-Pre: 65 %
FVC-Post: 1.72 L
FVC-Pre: 1.98 L
Post FEV1/FVC ratio: 83 %
Post FEV6/FVC ratio: 100 %
Pre FEV1/FVC ratio: 70 %
Pre FEV6/FVC Ratio: 100 %
RV % pred: 103 %
RV: 2.25 L
TLC % pred: 81 %
TLC: 4.13 L

## 2022-01-31 NOTE — Progress Notes (Signed)
? ?  Subjective:  ? ? Patient ID: [name] [name], female    DOB: 13-Jun-1951, 71 y.o.   MRN: [id number] ? ?HPI ? ?ROV 11/01/21 --71 year old never smoker followed for cough and cylindrical bronchiectasis noted on CT scan of the chest.  Past medical history also significant for hypertension, A. fib (ablated), allergic rhinitis.  She had COVID-19 in mid August 2022. ?She underwent bronchoscopy on 09/05/2021.  Bacterial fungal and AFB cultures all negative. ?PFT have not been done ?She developed URI symptoms beginning about 5 days ago - no known exposures. COVID negative. No fever clear head mucous.  ? ?ROV 01/31/22 --follow-up visit for [name] [name].  She has never smoked, has chronic cough with some cylindrical bronchiectatic change noted on CT scan of the chest.  Also with a history of hypertension, atrial fibrillation and allergic rhinitis, COVID-19 in 05/2021.  I saw her in January, treated her for an acute sinusitis and evolving bronchitis.  Also planned to schedule antihistamine, she did not do. Still having cough but less so than last visit. Non-productive.  ? ?Pulmonary function testing performed today and reviewed by me show mixed obstruction and restriction on spirometry without a bronchodilator response.  Normal lung volumes.  Normal diffusion capacity.  No significant curve of her flow-volume loop that would be consistent with obstruction ? ? ?Review of Systems ?As per HPI ? ?   ?Objective:  ? Physical Exam ?Vitals:  ? 01/31/22 1002  ?BP: 126/72  ?Pulse: 67  ?Temp: 97.9 ?F (36.6 ?C)  ?TempSrc: Oral  ?SpO2: 98%  ?Weight: 92 lb 12.8 oz (42.1 kg)  ?Height: _0  (1.626 m)  ? ? ?Gen: Pleasant, thin, in no distress,  normal affect ? ?ENT: No lesions,  mouth clear,  oropharynx clear, no postnasal drip ? ?Neck: No JVD, no stridor ? ?Lungs: No use of accessory muscles, no crackles or wheezing on normal respiration, no wheeze on forced expiration ? ?Cardiovascular: RRR, heart sounds normal, no murmur or gallops, no peripheral  edema ? ?Musculoskeletal: No deformities, no cyanosis or clubbing ? ?Neuro: alert, awake, non focal ? ?Skin: Warm, no lesions or rash ? ? ?   ?Assessment & Plan:  ? ?Cough ?Multifactorial.  Her cough is persistent, nonproductive.  I am most suspicious that she is probably colonized with AFB although this did not grow out on her bronchoscopy.  Also possible rhinitis, GERD -although not profoundly affected by either.  I talked to her about both of these.  If we decided to treat it would be empiric, may not be helpful.  She wants to talk to Dr. Percival Spanish about the medications, ensure that they will conflict with any of her other regimen.  No significant obstruction on her pulmonary function testing, nothing to support asthma or to support starting BD therapy. ? ?Bronchiectasis without complication ([place]) ?Micronodular disease and bronchiectasis on CT chest, AFB and fungal cultures negative from her BAL.  Still suspicious for possible colonization.  We will repeat her CT scan of the chest in October.  Depending on results we can discuss whether we need to do repeat BAL. ? ? ? ?Baltazar Apo, MD, PhD ?01/31/2022, 10:32 AM ?[facility] ?[phone] or if no answer before 7:00PM call [phone] ?For any issues after 7:00PM please call eLink [phone] ? ?

## 2022-01-31 NOTE — Assessment & Plan Note (Signed)
Multifactorial.  Her cough is persistent, nonproductive.  I am most suspicious that she is probably colonized with AFB although this did not grow out on her bronchoscopy.  Also possible rhinitis, GERD -although not profoundly affected by either.  I talked to her about both of these.  If we decided to treat it would be empiric, may not be helpful.  She wants to talk to Dr. Percival Spanish about the medications, ensure that they will conflict with any of her other regimen.  No significant obstruction on her pulmonary function testing, nothing to support asthma or to support starting BD therapy. ?

## 2022-01-31 NOTE — Patient Instructions (Signed)
Full PFT performed today. °

## 2022-01-31 NOTE — Patient Instructions (Signed)
We will arrange for a repeat high-resolution CT scan of the chest in October 2023 to compare with your prior. ?We reviewed your pulmonary function testing today.  We will not start any inhaled medication at this time. ?You could consider starting 2 medications to treat possible contributors to persistent dry cough: ?-Omeprazole 20 mg once daily to treat possible silent esophageal reflux ?-Loratadine 10 mg once daily to treat possible contribution of chronic allergies or congestion ?Follow with Dr. Lamonte Sakai in October after your CT so we can review the results together. ?

## 2022-01-31 NOTE — Progress Notes (Signed)
Full PFT performed today. °

## 2022-01-31 NOTE — Assessment & Plan Note (Signed)
Micronodular disease and bronchiectasis on CT chest, AFB and fungal cultures negative from her BAL.  Still suspicious for possible colonization.  We will repeat her CT scan of the chest in October.  Depending on results we can discuss whether we need to do repeat BAL. ?

## 2022-01-31 NOTE — Addendum Note (Signed)
Addended by: Gavin Potters R on: 01/31/2022 11:24 AM ? ? Modules accepted: Orders ? ?

## 2022-02-01 ENCOUNTER — Encounter: Payer: Self-pay | Admitting: Emergency Medicine

## 2022-02-01 ENCOUNTER — Encounter: Payer: Self-pay | Admitting: Cardiology

## 2022-02-01 NOTE — Telephone Encounter (Signed)
Mychart message sent by pt: ?[facility] (supporting Lamonte Sakai, Rose Fillers, MD) 5 minutes ago (2:44 PM)  ? ?Dr. Lamonte Sakai: ?  ?Thank you for seeing me on April 4. I had a follow-up question: When you mention that I could consider starting two medications to treat possible contributors to persistent dry cough, were you suggesting that I consider taking both omeprazole and loratadine at the same time, or trying one at a time by trying one for awhile, then another? Thank you. ?Kaeli Nichelson, [phone] ? ? ? ? ?Dr. Lamonte Sakai, please advise. ?

## 2022-02-03 ENCOUNTER — Telehealth: Payer: Self-pay | Admitting: Pharmacist

## 2022-02-03 NOTE — Chronic Care Management (AMB) (Signed)
? ? ?  Chronic Care Management ?Pharmacy Assistant  ? ?Name: [name] [name]  MRN: [id number] DOB: 1950-11-10 ? ?02/07/2022 APPOINTMENT REMINDER ? ? ?Called [name], No answer, left message of appointment on 02/07/2022 at 8:30 via telephone visit with [name], Pharm D. Notified to have all medications, supplements, blood pressure and/or blood sugar logs available during appointment and to return call if need to reschedule. ? ?Care Gaps: ?AWV - schedule 12/06/2022 ?Last BP - 126/72 on 01/31/2022 ?  ?Star Rating Drugs: ?None ? ?Any gaps in medications fill history? No ? ?Gennie Alma CMA  ?Clinical Pharmacist Assistant ?[phone] ? ?

## 2022-02-06 ENCOUNTER — Encounter: Payer: Self-pay | Admitting: Internal Medicine

## 2022-02-06 NOTE — Progress Notes (Signed)
? ?Chronic Care Management ?Pharmacy Note ? ?02/07/2022 ?Name:  [name] [name] MRN:  [id number] DOB:  [birth year] ? ?Summary: ?Pt reports she still has a cough but this has improved significantly ?BP is mostly at goal < 140/90 ?LDL at goal < 100 and significantly improved ?  ?Recommendations/Changes made from today's visit: ?-Recommended taking omeprazole prior to dinner to separate from Fosamax ?-Recommended continued BP monitoring at home ?  ?Plan: ?Follow up BP assessment in 3 months ?Follow up in 6 months ? ?Subjective: ?[name] [name] is an 71 y.o. year old female who is a primary patient of [name], [name], MD.  The CCM team was consulted for assistance with disease management and care coordination needs.   ? ?Engaged with patient by telephone for follow up visit in response to provider referral for pharmacy case management and/or care coordination services.  ? ?Consent to Services:  ?The patient was given information about Chronic Care Management services, agreed to services, and gave verbal consent prior to initiation of services.  Please see initial visit note for detailed documentation.  ? ?Patient Care Team: ?[name], [name], MD as PCP - General ?Minus Breeding, MD as PCP - Cardiology (Cardiology) ?Thompson Grayer, MD as PCP - Electrophysiology (Cardiology) ?Clent Jacks, MD (Ophthalmology) ?Minus Breeding, MD as Consulting Physician (Cardiology) ?Viona Gilmore, [facility] as Pharmacist (Pharmacist) ? ?Recent office visits: ?01/11/22 Shanon Ace, MD: Patient presented for annual exam. Referred to dermatology. ? ?12/05/21 Rolene Arbour, LPN: Patient presented for AWV. ? ?Recent consult visits: ?01/31/22 Baltazar Apo, MD (pulmonary): Patient presented for chronic cough follow up. Prescribed omeprazole 20 mg daily and loratadine 10 mg daily for possible silent GERD and contribution to chronic allergies or congestion. ? ?11/01/2021 Baltazar Apo MD (pulmonary) - Patient was seen for viral upper respiratory tract infection  and additional issues. No medication changes. Noted to try using Tylenol cold and flu and try starting Loratadine 56m daily. Follow up in 3 months.  ?  ?10/18/2021 MBarrington EllisonPA-C ([facility] - Patient was seen for paroxysmal artial fibrillation and additional issues. Discontinued Tessalon. Follow up in 6 months.  ?  ?10/07/2021 JMinus Breeding(Cardiology) - Patient was seen for nonrheumatic aortic valve insufficiency and additional issues.  No medication changes. Follow up after echo in June. ?  ?09/29/2021 CPhilemon KingdomMD (endocrinology) - Patient was seen for age related osteoporosis and an additional issue. No medication changes. Follow up in 1 year. ?  ?09/06/2021 GDeloria LairMD (opthalmology) - Patient was seen for left epiretinal membrane and additional issues.  No medication changes. Follow up in 1 year. ?  ?08/30/2021 RBaltazar ApoMD (pulmonary) - Patient was seen for Chronic cough and an additional issue. No medication changes. Follow up in 2 months. ?  ?08/17/2021 BGlennon MacDO (sport med) - Patient was seen for acute bilateral low back pain with left side sciatica and an additional issue. Started on Meloxicam 7.5 mg daily. Follow up in 2 weeks if no improvement. ?  ? ?[facility]: ?[facility] for abnormal CT of the chest. Performed video bronchoscopy. ? ?Objective: ? ?Lab Results  ?Component Value Date  ? CREATININE 0.68 01/11/2022  ? BUN 22 01/11/2022  ? GFR 88.98 01/11/2022  ? GFRNONAA >60 06/24/2021  ? GFRAA 86 09/28/2020  ? NA 137 01/11/2022  ? K 4.4 01/11/2022  ? CALCIUM 10.5 01/11/2022  ? CO2 31 01/11/2022  ? GLUCOSE 86 01/11/2022  ? ? ?  Lab Results  ?Component Value Date/Time  ? GFR 88.98 01/11/2022 10:45 AM  ? GFR 84.89 05/17/2018 09:12 AM  ?  ?Last diabetic Eye exam:  ?Lab Results  ?Component Value Date/Time  ? HMDIABEYEEXA Normal done by Dr.Groat 12/07/2011 12:00 AM  ?  ?Last diabetic Foot exam: No results  found for: HMDIABFOOTEX  ? ?Lab Results  ?Component Value Date  ? CHOL 176 01/11/2022  ? HDL 73.40 01/11/2022  ? [place] 87 01/11/2022  ? LDLDIRECT 131.0 09/03/2009  ? TRIG 81.0 01/11/2022  ? CHOLHDL 2 01/11/2022  ? ? ? ?  Latest Ref Rng & Units 01/11/2022  ? 10:45 AM 06/24/2021  ? 10:54 AM 05/17/2018  ?  9:12 AM  ?Hepatic Function  ?Total Protein 6.0 - 8.3 g/dL 8.2   6.6   7.4    ?Albumin 3.5 - 5.2 g/dL 4.1   2.8   4.5    ?AST 0 - 37 U/L '19   26   21    ' ?ALT 0 - 35 U/L '14   18   23    ' ?Alk Phosphatase 39 - 117 U/L 71   67   37    ?Total Bilirubin 0.2 - 1.2 mg/dL 0.5   1.2   0.7    ?Bilirubin, Direct 0.0 - 0.3 mg/dL 0.0    0.1    ? ? ?Lab Results  ?Component Value Date/Time  ? TSH 1.17 01/11/2022 10:45 AM  ? TSH 0.891 06/24/2021 12:32 PM  ? TSH 0.82 09/28/2020 08:22 AM  ? FREET4 1.00 12/08/2011 08:05 AM  ? ? ? ?  Latest Ref Rng & Units 01/11/2022  ? 10:45 AM 06/24/2021  ? 10:54 AM 01/10/2021  ?  7:55 AM  ?CBC  ?WBC 4.0 - 10.5 K/uL 7.6   20.5   6.0    ?Hemoglobin 12.0 - 15.0 g/dL 13.3   13.8   13.7    ?Hematocrit 36.0 - 46.0 % 40.4   41.3   40.6    ?Platelets 150.0 - 400.0 K/uL 312.0   303   226    ? ? ?Lab Results  ?Component Value Date/Time  ? VD25OH 42.31 09/29/2021 08:34 AM  ? VD25OH 44.3 09/28/2020 08:22 AM  ? VD25OH 57.64 11/20/2019 08:06 AM  ? ? ?Clinical ASCVD: No  ?The 10-year ASCVD risk score (Arnett DK, et al., 2019) is: 9.9% ?  Values used to calculate the score: ?    Age: 71 years ?    Sex: Female ?    Is Non-Hispanic African American: No ?    Diabetic: No ?    Tobacco smoker: No ?    Systolic Blood Pressure: 588 mmHg ?    Is BP treated: Yes ?    HDL Cholesterol: 73.4 mg/dL ?    Total Cholesterol: 176 mg/dL   ? ? ?  01/11/2022  ? 10:05 AM 12/05/2021  ?  9:15 AM 09/29/2020  ?  4:11 PM  ?Depression screen PHQ 2/9  ?Decreased Interest 0 0 0  ?Down, Depressed, Hopeless 0 0 0  ?PHQ - 2 Score 0 0 0  ?Altered sleeping 0    ?Tired, decreased energy 0    ?Change in appetite 0    ?Feeling bad or failure about yourself  0     ?Trouble concentrating 0    ?Moving slowly or fidgety/restless 0    ?Suicidal thoughts 0    ?PHQ-9 Score 0    ?  ? ?CHA2DS2/VAS Stroke Risk Points  Current  as of 4 hours ago  ?   3 >= 2 Points: High Risk  ?1 - 1.99 Points: Medium Risk  ?0 Points: Low Risk  ?  Last Change: N/A   ?  ? Details   ? This score determines the patient's risk of having a stroke if the  ?patient has atrial fibrillation.  ?  ?  ? Points Metrics  ?0 Has Congestive Heart Failure:  No   ? Current as of 4 hours ago  ?0 Has Vascular Disease:  No   ? Current as of 4 hours ago  ?1 Has Hypertension:  Yes   ? Current as of 4 hours ago  ?1 Age:  21   ? Current as of 4 hours ago  ?0 Has Diabetes:  No   ? Current as of 4 hours ago  ?0 Had Stroke:  No  Had TIA:  No  Had Thromboembolism:  No   ? Current as of 4 hours ago  ?1 Female:  Yes   ? Current as of 4 hours ago  ?  ? ?Social History  ? ?Tobacco Use  ?Smoking Status Never  ?Smokeless Tobacco Never  ? ?BP Readings from Last 3 Encounters:  ?01/31/22 126/72  ?01/11/22 120/66  ?12/05/21 100/60  ? ?Pulse Readings from Last 3 Encounters:  ?01/31/22 67  ?01/11/22 (!) 59  ?12/05/21 61  ? ?Wt Readings from Last 3 Encounters:  ?01/31/22 92 lb 12.8 oz (42.1 kg)  ?01/11/22 91 lb (41.3 kg)  ?12/05/21 92 lb 6.4 oz (41.9 kg)  ? ?BMI Readings from Last 3 Encounters:  ?01/31/22 15.93 kg/m?  ?01/11/22 15.62 kg/m?  ?12/05/21 15.86 kg/m?  ? ? ?Assessment/Interventions: Review of patient past medical history, allergies, medications, health status, including review of consultants reports, laboratory and other test data, was performed as part of comprehensive evaluation and provision of chronic care management services.  ? ?SDOH:  (Social Determinants of Health) assessments and interventions performed: No ? ?SDOH Screenings  ? ?Alcohol Screen: Low Risk   ? Last Alcohol Screening Score (AUDIT): 0  ?Depression (PHQ2-9): Low Risk   ? PHQ-2 Score: 0  ?Financial Resource Strain: Low Risk   ? Difficulty of Paying Living  Expenses: Not hard at all  ?Food Insecurity: No Food Insecurity  ? Worried About Charity fundraiser in the Last Year: Never true  ? Ran Out of Food in the Last Year: Never true  ?Housing: Low Risk   ? Last Housi

## 2022-02-07 ENCOUNTER — Ambulatory Visit (INDEPENDENT_AMBULATORY_CARE_PROVIDER_SITE_OTHER): Payer: Medicare Other | Admitting: Pharmacist

## 2022-02-07 DIAGNOSIS — I10 Essential (primary) hypertension: Secondary | ICD-10-CM

## 2022-02-07 DIAGNOSIS — I480 Paroxysmal atrial fibrillation: Secondary | ICD-10-CM

## 2022-02-07 NOTE — Patient Instructions (Addendum)
Hi Hagar, ? ?It was great to catch up with you again. I'm glad you are feeling better and I hope these new medications help get rid of your cough! ? ?Please reach out to me if you have any questions or need anything before our follow up! ? ?Best, ?Maddie ? ?[name], PharmD, BCACP ?Clinical Pharmacist ?Therapist, music at [place] ?[phone] ? ? Visit Information ? ? Goals Addressed   ?None ?  ? ?Patient Care Plan: [place]  ?  ? ?Problem Identified: Problem: Hypertension, Atrial Fibrillation, Anxiety, Osteoporosis and Macular degeneration   ?  ? ?Long-Range Goal: Patient-Specific Goal   ?Start Date: 02/11/2021  ?Expected End Date: 02/11/2022  ?Recent Progress: On track  ?Priority: High  ?Note:   ?Current Barriers:  ?Unable to maintain control of Afib ? ?Pharmacist Clinical Goal(s):  ?Patient will achieve adherence to monitoring guidelines and medication adherence to achieve therapeutic efficacy through collaboration with PharmD and provider.  ? ?Interventions: ?1:1 collaboration with [name], [name], MD regarding development and update of comprehensive plan of care as evidenced by provider attestation and co-signature ?Inter-disciplinary care team collaboration (see longitudinal plan of care) ?Comprehensive medication review performed; medication list updated in electronic medical record ? ?Hypertension (BP goal <140/90) ?-Controlled ?-Current treatment: ?Diltiazem 300 mg 1 capsule daily - Appropriate, Effective, Safe, Accessible ?Metoprolol tartrate 25 mg 1 tablet twice daily - Appropriate, Effective, Safe, Accessible ?-Medications previously tried: n/a  ?-Current home readings: 126/72 (needs to replace the battery on her machine) ?-Current dietary habits: limits salt intake ?-Current exercise habits: [employer] [facility] [place] classes and walking  ?-Denies hypotensive/hypertensive symptoms ?-Educated on Exercise goal of 150 minutes per week; ?Importance of home blood pressure monitoring; ?-Counseled to  monitor BP at home at least weekly, document, and provide log at future appointments ?-Counseled on diet and exercise extensively ?Recommended to continue current medication ? ?Atrial Fibrillation (Goal: prevent stroke and major bleeding) ?-Not ideally controlled ?-CHADSVASC: 3 ?-Current treatment: ?Rate/rhythm control: Tikosyn 250 mcg 1 tablet twice daily; Metoprolol 25 mg twice daily - Appropriate, Effective, Safe, Accessible ?Anticoagulation: Eliquis 5 mg 1 tablet twice daily  - Appropriate, Effective, Safe, Accessible ?-Medications previously tried: none ?-Home BP and HR readings: 70-90s ?-Counseled on increased risk of stroke due to Afib and benefits of anticoagulation for stroke prevention; ?-Recommended to continue current medication ? ?Anxiety for flying (Goal: minimize symptoms) ?-Controlled ?-Current treatment: ?Lorazepam 0.5 mg 1 tablet as needed - Appropriate, Effective, Safe, Accessible ?-Medications previously tried/failed: none ?-GAD7: n/a ?-Educated on Benefits of medication for symptom control ?-Recommended to continue current medication ? ?Osteoporosis (Goal prevent fractures) ?-Controlled ?-Last DEXA Scan: 08/2020  ?            T-Score femoral neck: LFN -2.3, RFN -2.5 ?            T-Score total hip: n/a ?            T-Score lumbar spine: -1.9 ?            T-Score forearm radius: n/a ?            10-year probability of major osteoporotic fracture: n/a ?            10-year probability of hip fracture: n/a ?-Patient is a candidate for pharmacologic treatment due to T-Score < -2.5 in femoral neck ?-Current treatment  ?Fosamax 70 mg 1 tablet weekly - Saturday - Appropriate, Effective, Safe, Accessible ?Calcium-vitamin D 600 mg- 2500 units daily - Appropriate, Effective, Safe, Accessible ?-  Medications previously tried: none  ?-Recommend [phone] units of vitamin D daily. Recommend 1200 mg of calcium daily from dietary and supplemental sources. Counseled on oral bisphosphonate administration: take in the  morning, 30 minutes prior to food with 6-8 oz of water. Do not lie down for at least 30 minutes after taking. Recommend weight-bearing and muscle strengthening exercises for building and maintaining bone density. ?-Recommended to continue current medication ? ?Macular degeneration (Goal: maintain healthy eyes) ?-Controlled ?-Current treatment  ?Preservision areds 1 tablet twice daily - Appropriate, Effective, Safe, Accessible ?Eysuvis 0.25% apply 1 drop three times daily - Appropriate, Effective, Safe, Accessible ?-Medications previously tried: none  ?-Recommended to continue current medication ? ?Health Maintenance ?-Vaccine gaps: tetanus (patient reported getting at pharmacy a couple years ago - unable to confirm with [facility]) ?-Current therapy:  ?Tylenol 500 mg 1 tablet as needed ?-Educated on Cost vs benefit of each product must be carefully weighed by individual consumer ?-Patient is satisfied with current therapy and denies issues ?-Recommended to continue current medication ? ?Patient Goals/Self-Care Activities ?Patient will:  ?- check blood pressure at least weekly, document, and provide at future appointments ?target a minimum of 150 minutes of moderate intensity exercise weekly ? ?Follow Up Plan: Telephone follow up appointment with care management team member scheduled for: 6 months ? ?  ?  ? ?Patient verbalizes understanding of instructions and care plan provided today and agrees to view in [place]. Active MyChart status confirmed with patient.   ?Telephone follow up appointment with pharmacy team member scheduled for: 6 months ? ?Viona Gilmore, [name]  ?

## 2022-02-09 NOTE — Telephone Encounter (Signed)
I am not aware of less effectiveness with fosomax. However there can be  decrease absorption of calcium carbonate with PPI ( omeprazole type meds)   and may effect bone density over time but these are observational studies and not  proven. Some can try calcium citrate  supplements  instead of carbonate  to help absorption.  I will forward also your questions  to our pharmacy team , Quentin Cornwall for input . ( Maddie can you  give her advice?)

## 2022-02-09 NOTE — Telephone Encounter (Signed)
I already spoke with the patient earlier this week and said that it's unlikely but advised her to go ahead and just separate them by a few hours to be on the safer side. I told her to take the omeprazole in the evenings prior to a meal instead of in the morning. ?

## 2022-02-20 NOTE — Telephone Encounter (Signed)
Mychart message sent by pt wanting a clarification on the PFT results. Pt is aware that RB is not avail until 5/1 and is okay waiting until then. ? ?Routing to Dr. Lamonte Sakai. ?

## 2022-02-21 ENCOUNTER — Encounter (HOSPITAL_COMMUNITY): Payer: Self-pay

## 2022-02-21 ENCOUNTER — Encounter: Payer: Self-pay | Admitting: Cardiology

## 2022-02-22 NOTE — Telephone Encounter (Signed)
Will close this encounter . Patient received information from afib clinic ?

## 2022-02-26 DIAGNOSIS — I480 Paroxysmal atrial fibrillation: Secondary | ICD-10-CM

## 2022-02-26 DIAGNOSIS — I10 Essential (primary) hypertension: Secondary | ICD-10-CM

## 2022-03-13 ENCOUNTER — Other Ambulatory Visit: Payer: Self-pay | Admitting: Cardiology

## 2022-03-24 ENCOUNTER — Encounter: Payer: Self-pay | Admitting: Internal Medicine

## 2022-03-28 ENCOUNTER — Encounter: Payer: Self-pay | Admitting: Family Medicine

## 2022-03-28 ENCOUNTER — Ambulatory Visit (INDEPENDENT_AMBULATORY_CARE_PROVIDER_SITE_OTHER): Payer: Medicare Other | Admitting: Family Medicine

## 2022-03-28 VITALS — BP 120/62 | HR 74 | Temp 98.6°F | Wt 92.0 lb

## 2022-03-28 DIAGNOSIS — M79651 Pain in right thigh: Secondary | ICD-10-CM | POA: Diagnosis not present

## 2022-03-28 NOTE — Progress Notes (Signed)
   Subjective:    Patient ID: [name] [name], female    DOB: 1951/08/12, 71 y.o.   MRN: [id number]  HPI Here for 6 weeks of a dull pain in the anterior right thigh. No recent trauma. She walks everyday but doe snot lift weights. No pain in the back of the hip. No knee pain. She has done nothing for it. No swelling in the ankles or feet. She is on Eliquis which would make it very unlikely that she has a DVT.    Review of Systems  Constitutional: Negative.   Respiratory: Negative.    Cardiovascular: Negative.   Musculoskeletal:  Positive for myalgias.      Objective:   Physical Exam Constitutional:      General: She is not in acute distress.    Appearance: Normal appearance.     Comments: Gets up and down from the exam table easily   Cardiovascular:     Rate and Rhythm: Normal rate and regular rhythm.     Pulses: Normal pulses.     Heart sounds: Normal heart sounds.  Pulmonary:     Effort: Pulmonary effort is normal.     Breath sounds: Normal breath sounds.  Musculoskeletal:     Comments: Very mildly tender in the anterior right thigh midway between the hip and knee. The right hip and knee are normal with full ROM. No masses or cords are felt.   Neurological:     Mental Status: She is alert.          Assessment & Plan:  Right thigh pain of unclear etiology. We will refer her to Sports Medicine for further evaluation. Alysia Penna, MD

## 2022-04-05 NOTE — Progress Notes (Signed)
[name] [name] D.[place] Phone: [phone]   Assessment and Plan:     1. Right hip pain 2. Strain of right quadriceps, initial encounter -Chronic with exacerbation, unclear etiology, initial sports medicine visit - Unclear etiology of anterior right thigh pain present for months and worsening over the past several weeks without known MOI - Most likely quadricep strain, specifically vastus lateralis based on patient tenderness on physical exam, though pain is not reproduced with quadriceps activation.  Patient is on Eliquis and does not experience signs and symptoms of DVT.  Patient has not had any significant increase in physical activity and had a negative lever test, so low suspicion for stress reaction or stress fracture.  Relatively unremarkable hip x-ray in clinic - Start HEP for quadriceps - May use Tylenol 500 mg 2-3 times a day for day-to-day pain relief - May use topical Voltaren gel as needed for pain relief - Do not recommend using a NSAIDs p.o. due to history of heart disease and patient being on chronic Eliquis - X-ray obtained in clinic.  My interpretation: No acute fracture or dislocation.  Relatively maintained femoral acetabular joint space with mild degenerative changes   Pertinent previous records reviewed include family medicine note 03/28/2022, family medicine patient message 5/26/20233   Follow Up: 4 weeks for reevaluation.  Could consider ultrasound versus formal PT versus advanced imaging   Subjective:   I, [name] [name], am serving as a Education administrator for Doctor Glennon Mac  Chief Complaint: right thigh pain   HPI:  04/06/2022 Patient is a 71 year old female complaining of right thigh pain. Patient states that for about 7 weeks to 2 months pain on the right from the groin to above the knee it makes her slower to walk, feels like she is limping, no MOI, no numbness or tingling, has not  been taking meds , no creams, feels like she has to tell her leg to go not a severe problem just annoying    Relevant Historical Information: Hypertension, paroxysmal atrial fibrillation, chronically anticoagulated with Eliquis  Additional pertinent review of systems negative.   Current Outpatient Medications:    acetaminophen (TYLENOL) 500 MG tablet, Take 1,000 mg by mouth every 6 (six) hours as needed for moderate pain., Disp: , Rfl:    alendronate (FOSAMAX) 70 MG tablet, Take 1 tablet (70 mg total) by mouth every Saturday. TAKE 1 TABLET BY MOUTH ONCE A WEEK WITH A FULL GLASS OF WATER ON AN EMPTY STOMACH, Disp: , Rfl:    apixaban (ELIQUIS) 5 MG TABS tablet, TAKE 1 TABLET BY MOUTH  TWICE DAILY, Disp: 180 tablet, Rfl: 1   Calcium Carb-Cholecalciferol (CALCIUM+D3 PO), Take 1 tablet by mouth daily with supper., Disp: , Rfl:    diltiazem (CARDIZEM) 30 MG tablet, Take 1 tablet every 4 hours AS NEEDED for breakthrough AFIB, Disp: 45 tablet, Rfl: 1   diltiazem (TIAZAC) 300 MG 24 hr capsule, Take 1 capsule (300 mg total) by mouth daily., Disp: 90 capsule, Rfl: 3   dofetilide (TIKOSYN) 250 MCG capsule, Take 1 capsule (250 mcg total) by mouth 2 (two) times daily., Disp: 180 capsule, Rfl: 3   LORazepam (ATIVAN) 0.5 MG tablet, Take 0.5 mg by mouth daily as needed for anxiety (when flying)., Disp: , Rfl:    metoprolol tartrate (LOPRESSOR) 25 MG tablet, TAKE 1 TABLET BY MOUTH TWICE  DAILY, Disp: 180 tablet, Rfl: 3   Multiple Vitamins-Minerals (PRESERVISION AREDS PO),  Take 1 capsule by mouth in the morning and at bedtime., Disp: , Rfl:    potassium chloride (KLOR-CON) 10 MEQ tablet, Take 2 tablets (20 mEq total) by mouth daily., Disp: 180 tablet, Rfl: 3   Objective:     Vitals:   04/06/22 1520  BP: 100/70  Pulse: 62  SpO2: 96%  Weight: 93 lb (42.2 kg)  Height: '5\' 4"'$  (1.626 m)      Body mass index is 15.96 kg/m.    Physical Exam:    General: awake, alert, and oriented no acute distress,  nontoxic Skin: no suspicious lesions or rashes Neuro:sensation intact distally with no dificits, normal muscle tone, no atrophy, strength 5/5 in all tested lower ext groups Psych: normal mood and affect, speech clear  Right hip: No deformity, swelling.  Generalized decrease musculature bilaterally ROM Flexion 90, ext 30, IR 45, ER 45 TTP lateral quadricep NTTP over the hip flexors, greater troch, glute musculature, si joint, lumbar spine Negative log roll with FROM Negative FABER Negative FADIR Negative Piriformis test Negative trendelenberg Gait normal    Electronically signed by:  [name] [name] D.[facility] 4:10 PM 04/06/22

## 2022-04-06 ENCOUNTER — Ambulatory Visit (INDEPENDENT_AMBULATORY_CARE_PROVIDER_SITE_OTHER): Payer: Medicare Other

## 2022-04-06 ENCOUNTER — Ambulatory Visit: Payer: Medicare Other | Admitting: Sports Medicine

## 2022-04-06 VITALS — BP 100/70 | HR 62 | Ht 64.0 in | Wt 93.0 lb

## 2022-04-06 DIAGNOSIS — M25551 Pain in right hip: Secondary | ICD-10-CM | POA: Diagnosis not present

## 2022-04-06 DIAGNOSIS — S76111A Strain of right quadriceps muscle, fascia and tendon, initial encounter: Secondary | ICD-10-CM

## 2022-04-06 NOTE — Patient Instructions (Addendum)
Good to see you  Tylenol 500 mg 2-3 times a day for pain relief  Voltaren gel as needed  Quad HEP  4 week follow up

## 2022-04-07 ENCOUNTER — Ambulatory Visit (HOSPITAL_COMMUNITY): Payer: Medicare Other | Attending: Internal Medicine

## 2022-04-07 DIAGNOSIS — I351 Nonrheumatic aortic (valve) insufficiency: Secondary | ICD-10-CM | POA: Diagnosis not present

## 2022-04-07 DIAGNOSIS — R931 Abnormal findings on diagnostic imaging of heart and coronary circulation: Secondary | ICD-10-CM

## 2022-04-07 DIAGNOSIS — I480 Paroxysmal atrial fibrillation: Secondary | ICD-10-CM | POA: Diagnosis not present

## 2022-04-07 DIAGNOSIS — I10 Essential (primary) hypertension: Secondary | ICD-10-CM | POA: Diagnosis not present

## 2022-04-07 LAB — ECHOCARDIOGRAM COMPLETE
AR max vel: 2.38 cm2
AV Area VTI: 2.09 cm2
AV Area mean vel: 2.04 cm2
AV Mean grad: 8.7 mmHg
AV Peak grad: 17.8 mmHg
Ao pk vel: 2.11 m/s
Area-P 1/2: 2.9 cm2
P 1/2 time: 476 msec
S' Lateral: 3.3 cm

## 2022-04-10 NOTE — Progress Notes (Signed)
PCP:  Burnis Medin, MD Primary Cardiologist: Minus Breeding, MD Electrophysiologist: Thompson Grayer, MD   [name] [name] is a 71 y.o. female seen today for Thompson Grayer, MD for routine electrophysiology followup.  Since last being seen in our clinic the patient reports doing OK since last visit. She has AF breakthrough 1-2 times a week, usually for up to several hours at a time.  She is aware of this with palpitations, but otherwise tolerates OK. she denies chest pain, dyspnea, PND, orthopnea, nausea, vomiting, dizziness, syncope, edema, weight gain, or early satiety.  Past Medical History:  Diagnosis Date   Allergic rhinitis    Aortic insufficiency    CAT SCRATCH 09/10/2009   Qualifier: Diagnosis of  By: Regis Bill MD, [name]    Clavicle fracture 12/2006   left   Diverticulosis    HTN (hypertension)    Osteopenia    Paroxysmal atrial fibrillation (HCC)    Pneumonia    PONV (postoperative nausea and vomiting)    Vision disturbance 03/13/2013   Negative neuro MRA MRI   Past Surgical History:  Procedure Laterality Date   ABDOMINAL HYSTERECTOMY     pt denies 04/15/14   APPENDECTOMY     ATRIAL FIBRILLATION ABLATION N/A 02/01/2021   Procedure: ATRIAL FIBRILLATION ABLATION;  Surgeon: Thompson Grayer, MD;  Location: [facility];  Service: Cardiovascular;  Laterality: N/A;   BRONCHIAL WASHINGS  09/05/2021   Procedure: BRONCHIAL WASHINGS;  Surgeon: Collene Gobble, MD;  Location: [facility];  Service: Cardiopulmonary;;   LAPAROSCOPIC SALPINGO OOPHERECTOMY Right    TONSILLECTOMY AND ADENOIDECTOMY     VIDEO BRONCHOSCOPY N/A 09/05/2021   Procedure: VIDEO BRONCHOSCOPY WITHOUT FLUORO;  Surgeon: Collene Gobble, MD;  Location: [place];  Service: Cardiopulmonary;  Laterality: N/A;    Current Outpatient Medications  Medication Sig Dispense Refill   acetaminophen (TYLENOL) 500 MG tablet Take 1,000 mg by mouth every 6 (six) hours as needed for moderate pain.     alendronate (FOSAMAX) 70  MG tablet Take 1 tablet (70 mg total) by mouth every Saturday. TAKE 1 TABLET BY MOUTH ONCE A WEEK WITH A FULL GLASS OF WATER ON AN EMPTY STOMACH     apixaban (ELIQUIS) 5 MG TABS tablet TAKE 1 TABLET BY MOUTH  TWICE DAILY 180 tablet 1   Calcium Citrate-Vitamin D (CITRACAL + D PO) Take by mouth. 1 per day     Cholecalciferol (VITAMIN D3) 50 MCG (2000 UT) TABS Take by mouth. 1 per day     diltiazem (CARDIZEM) 30 MG tablet Take 1 tablet every 4 hours AS NEEDED for breakthrough AFIB 45 tablet 1   diltiazem (TIAZAC) 300 MG 24 hr capsule Take 1 capsule (300 mg total) by mouth daily. 90 capsule 3   dofetilide (TIKOSYN) 250 MCG capsule Take 1 capsule (250 mcg total) by mouth 2 (two) times daily. 180 capsule 3   LORazepam (ATIVAN) 0.5 MG tablet Take 0.5 mg by mouth daily as needed for anxiety (when flying).     metoprolol tartrate (LOPRESSOR) 25 MG tablet TAKE 1 TABLET BY MOUTH TWICE  DAILY 180 tablet 3   Multiple Vitamins-Minerals (PRESERVISION AREDS PO) Take 1 capsule by mouth in the morning and at bedtime.     potassium chloride (KLOR-CON) 10 MEQ tablet Take 2 tablets (20 mEq total) by mouth daily. 180 tablet 3   pravastatin (PRAVACHOL) 20 MG tablet Take 1 tablet (20 mg total) by mouth every evening. 90 tablet 3   No current facility-administered  medications for this visit.    Allergies  Allergen Reactions   Latex Hives   Other Rash and Other (See Comments)    Adhesive pads left on skin for extended periods = BURNS AND RASHES ON THE SKIN!!!!   Codeine Phosphate Other (See Comments)    Drowsiness    Demerol [Meperidine] Nausea And Vomiting   Protonix [Pantoprazole] Rash   Sulfa Antibiotics Rash   Tens Therapy Replace Back Pads Itching and Rash    Social History   Socioeconomic History   Marital status: Married    Spouse name: Not on file   Number of children: 0   Years of education: Not on file   Highest education level: Bachelor's degree (e.g., BA, AB, BS)  Occupational History    Occupation: Veterinary surgeon   Occupation: REPORTER    Employer: [employer]  Tobacco Use   Smoking status: Never   Smokeless tobacco: Never  Substance and Sexual Activity   Alcohol use: Not Currently   Drug use: No   Sexual activity: Not on file  Other Topics Concern   Not on file  Social History Narrative   Married   hh of 2    p0g0   Works for [employer]    Social Determinants of Radio broadcast assistant Strain: [place]  (03/26/2022)   Overall Financial Resource Strain (CARDIA)    Difficulty of Paying Living Expenses: Not hard at all  Food Insecurity: No Food Insecurity (03/26/2022)   Hunger Vital Sign    Worried About Running Out of Food in the Last Year: Never true    [place] in the Last Year: Never true  Transportation Needs: No Transportation Needs (03/26/2022)   PRAPARE - Hydrologist (Medical): No    Lack of Transportation (Non-Medical): No  Physical Activity: Insufficiently Active (03/26/2022)   Exercise Vital Sign    Days of Exercise per Week: 3 days    Minutes of Exercise per Session: 30 min  Stress: No Stress Concern Present (03/26/2022)   [place]    Feeling of Stress : Only a little  Social Connections: Socially Integrated (03/26/2022)   Social Connection and Isolation Panel [NHANES]    Frequency of Communication with Friends and Family: More than three times a week    Frequency of Social Gatherings with Friends and Family: Twice a week    Attends Religious Services: 1 to 4 times per year    Active Member of [employer] or Organizations: Yes    Attends Music therapist: More than 4 times per year    Marital Status: Married  Human resources officer Violence: Not At Risk (12/05/2021)   Humiliation, Afraid, Rape, and Kick questionnaire    Fear of Current or Ex-Partner: No    Emotionally Abused: No    Physically Abused: No    Sexually Abused: No      Review of Systems: All other systems reviewed and are otherwise negative except as noted above.  Physical Exam: Vitals:   04/17/22 0918  BP: 122/66  Pulse: 67  SpO2: 98%  Weight: 92 lb 9.6 oz (42 kg)  Height: '5\' 4"'$  (1.626 m)    GEN- The patient is well appearing, alert and oriented x 3 today.   HEENT: normocephalic, atraumatic; sclera clear, conjunctiva pink; hearing intact; oropharynx clear; neck supple, no JVP Lymph- no cervical lymphadenopathy Lungs- Clear to ausculation bilaterally, normal work  of breathing.  No wheezes, rales, rhonchi Heart- Regular rate and rhythm, no murmurs, rubs or gallops, PMI not laterally displaced GI- soft, non-tender, non-distended, bowel sounds present, no hepatosplenomegaly Extremities- no clubbing, cyanosis, or edema; DP/PT/radial pulses 2+ bilaterally MS- no significant deformity or atrophy Skin- warm and dry, no rash or lesion Psych- euthymic mood, full affect Neuro- strength and sensation are intact  EKG is ordered. Personal review of EKG from today shows NSr at 67 bpm with stable QTc  Additional studies reviewed include: Previous EP office notes.   Assessment and Plan:  1. Paroxysmal afib EKG today shows NSR, stable QT S/p ablation 02/01/21, multiple atrial flutter circuits noted. Not felt to be amenable to re-do.  Patient is s/p dofetilide admission 8/2-06/03/21. Continue dofetilide 250 mcg BID.  Continue Eliquis 5 mg BID Continue metoprolol 25 mg BID Continue diltiazem 300 mg daily with 30 mg PRN q 4 hours for heart racing.   2. CHA2DS2VASc score of at least 3 Continue eliquis 5 mg bid   Labs today   3. AI If AV repair is indicated, could consider MAZE at that time. Dr. Percival Spanish following.  Echo 04/07/2022 AI Mod/Severe. Plan to f/u annually.    4. HTN Stable on current regimen   Follow up with Dr. Curt Bears in 6 months to establish from Dr. Rayann Heman.  Shirley Friar, PA-C  04/17/22 9:30 AM

## 2022-04-11 DIAGNOSIS — I77819 Aortic ectasia, unspecified site: Secondary | ICD-10-CM | POA: Insufficient documentation

## 2022-04-11 DIAGNOSIS — I7789 Other specified disorders of arteries and arterioles: Secondary | ICD-10-CM | POA: Insufficient documentation

## 2022-04-11 DIAGNOSIS — R931 Abnormal findings on diagnostic imaging of heart and coronary circulation: Secondary | ICD-10-CM | POA: Insufficient documentation

## 2022-04-11 DIAGNOSIS — E785 Hyperlipidemia, unspecified: Secondary | ICD-10-CM | POA: Insufficient documentation

## 2022-04-11 NOTE — Progress Notes (Unsigned)
Cardiology Office Note   Date:  04/12/2022   ID:  [name] [name], DOB 23-May-1951, MRN [id number]  PCP:  Burnis Medin, MD  Cardiologist:   Minus Breeding, MD    Chief Complaint  Patient presents with   Palpitations      History of Present Illness: [name] [name] is a 71 y.o. female who for follow up of palpitations and aortic insufficiency.  She has paroxysmal atrial fibrillation. She is status post ablation.   She had Covid and she had increased fib post this.  She was started on Tikosyn in August.   We saw her prior to bronchoscopy.    She had an echo last week and is here for follow up.  She had moderate to severe AI.  EF was normal with NL LV size.  He did go back and compare this to 2021 in 2022.  Pressure half-time is actually higher than it was in 2022 but lower than 2021.  She has had no symptoms.  She does have some palpitations and occasionally goes back into atrial fibrillation and takes an extra Cardizem but it seems to be well maintained and less frequent.  She denies any new shortness of breath, PND or orthopnea.  She had no presyncope or syncope.  She says she eats okay but she has continued to lose weight and is now 92 pounds.   Past Medical History:  Diagnosis Date   Allergic rhinitis    Aortic insufficiency    CAT SCRATCH 09/10/2009   Qualifier: Diagnosis of  By: Regis Bill MD, [name]    Clavicle fracture 12/2006   left   Diverticulosis    HTN (hypertension)    Osteopenia    Paroxysmal atrial fibrillation (HCC)    Pneumonia    PONV (postoperative nausea and vomiting)    Vision disturbance 03/13/2013   Negative neuro MRA MRI    Past Surgical History:  Procedure Laterality Date   ABDOMINAL HYSTERECTOMY     pt denies 04/15/14   APPENDECTOMY     ATRIAL FIBRILLATION ABLATION N/A 02/01/2021   Procedure: ATRIAL FIBRILLATION ABLATION;  Surgeon: Thompson Grayer, MD;  Location: [facility];  Service: Cardiovascular;  Laterality: N/A;   BRONCHIAL WASHINGS   09/05/2021   Procedure: BRONCHIAL WASHINGS;  Surgeon: Collene Gobble, MD;  [facility];  Service: Cardiopulmonary;;   LAPAROSCOPIC SALPINGO OOPHERECTOMY Right    TONSILLECTOMY AND ADENOIDECTOMY     VIDEO BRONCHOSCOPY N/A 09/05/2021   Procedure: VIDEO BRONCHOSCOPY WITHOUT FLUORO;  Surgeon: Collene Gobble, MD;  Location: [facility];  Service: Cardiopulmonary;  Laterality: N/A;     Current Outpatient Medications  Medication Sig Dispense Refill   acetaminophen (TYLENOL) 500 MG tablet Take 1,000 mg by mouth every 6 (six) hours as needed for moderate pain.     alendronate (FOSAMAX) 70 MG tablet Take 1 tablet (70 mg total) by mouth every Saturday. TAKE 1 TABLET BY MOUTH ONCE A WEEK WITH A FULL GLASS OF WATER ON AN EMPTY STOMACH     apixaban (ELIQUIS) 5 MG TABS tablet TAKE 1 TABLET BY MOUTH  TWICE DAILY 180 tablet 1   Calcium Carb-Cholecalciferol (CALCIUM+D3 PO) Take 1 tablet by mouth daily with supper.     diltiazem (CARDIZEM) 30 MG tablet Take 1 tablet every 4 hours AS NEEDED for breakthrough AFIB 45 tablet 1   diltiazem (TIAZAC) 300 MG 24 hr capsule Take 1 capsule (300 mg total) by mouth daily. 90 capsule 3  dofetilide (TIKOSYN) 250 MCG capsule Take 1 capsule (250 mcg total) by mouth 2 (two) times daily. 180 capsule 3   LORazepam (ATIVAN) 0.5 MG tablet Take 0.5 mg by mouth daily as needed for anxiety (when flying).     metoprolol tartrate (LOPRESSOR) 25 MG tablet TAKE 1 TABLET BY MOUTH TWICE  DAILY 180 tablet 3   Multiple Vitamins-Minerals (PRESERVISION AREDS PO) Take 1 capsule by mouth in the morning and at bedtime.     potassium chloride (KLOR-CON) 10 MEQ tablet Take 2 tablets (20 mEq total) by mouth daily. 180 tablet 3   pravastatin (PRAVACHOL) 20 MG tablet Take 1 tablet (20 mg total) by mouth every evening. 90 tablet 3   No current facility-administered medications for this visit.    Allergies:   Latex, Other, Codeine phosphate, Demerol [meperidine], Protonix [pantoprazole],  Sulfa antibiotics, and Tens therapy replace back pads    ROS:  Please see the history of present illness.   Otherwise, review of systems are positive for none.   All other systems are reviewed and negative.    PHYSICAL EXAM: VS:  BP (!) 118/58   Pulse 61   Ht '5\' 4"'$  (1.626 m)   Wt 92 lb 9.6 oz (42 kg)   SpO2 98%   BMI 15.89 kg/m  , BMI Body mass index is 15.89 kg/m. GENERAL:  Well appearing NECK:  No jugular venous distention, waveform within normal limits, carotid upstroke brisk and symmetric, no bruits, no thyromegaly LUNGS:  Clear to auscultation bilaterally CHEST:  Unremarkable HEART:  PMI not displaced or sustained,S1 and S2 within normal limits, no S3, no S4, no clicks, no rubs, 2 out of 6 diastolic heard over the entire precordium and ending past the mid diastole, no systolic murmurs ABD:  Flat, positive bowel sounds normal in frequency in pitch, no bruits, no rebound, no guarding, no midline pulsatile mass, no hepatomegaly, no splenomegaly EXT:  2 plus pulses throughout, no edema, no cyanosis no clubbing   EKG:  EKG is  ordered today. Sinus rhythm, rate 61, axis within normal limits, intervals within normal limits, no acute ST-T wave changes.   Recent Labs: 10/18/2021: Magnesium 2.1 01/11/2022: ALT 14; BUN 22; Creatinine, Ser 0.68; Hemoglobin 13.3; Platelets 312.0; Potassium 4.4; Sodium 137; TSH 1.17    Lipid Panel    Component Value Date/Time   CHOL 176 01/11/2022 1045   TRIG 81.0 01/11/2022 1045   HDL 73.40 01/11/2022 1045   CHOLHDL 2 01/11/2022 1045   VLDL 16.2 01/11/2022 1045   LDLCALC 87 01/11/2022 1045   LDLDIRECT 131.0 09/03/2009 0813      Wt Readings from Last 3 Encounters:  04/12/22 92 lb 9.6 oz (42 kg)  04/06/22 93 lb (42.2 kg)  03/28/22 92 lb (41.7 kg)      Other studies Reviewed: Additional studies/ records that were reviewed today include: Echocardiogram, labs Review of the above records demonstrates:  Please see elsewhere in the note.      ASSESSMENT AND PLAN:    Aortic insufficiency -  This was still moderate  to severe again on echo last week.  I do not think it is progressed since last year.  She has normal left ventricular size and function.  No change in therapy.  I will follow this up with an echo 1 year from now.  Atrial fib-  [name] [name] has a CHA2DS2 - VASc score of 2.  She tolerates anticoagulation.  She is having some paroxysms but they are tolerable.  No change in therapy.  Her QT interval is unchanged from previous and she is tolerating Tikosyn.   HTN - The blood pressure is at target.  No change in therapy.   Elevated coronary calcium - She does have an elevated coronary calcium score noted on the CT.  This was 231 which was 86 percentile.  She had a negative stress test in 2018.  She is active and has no new symptoms.  No further work-up.  Elevated lipid - We have talked at great length about risk reduction given her aortic atherosclerosis and known coronary calcium.  She agrees to try pravastatin and I will start with a low-dose 20 mg.   Aortic atherosclerosis - There is mobile plaque on the echo.  This is being managed with aggressive risk reduction.  Would like to check a lipid profile in about 10 weeks.  Ascending aortic dilatation - This is 43 mm.  She is going to have a CT high-resolution in the spring and I think this is fine to see if we can judge any aortic size.  Probably get an angiogram of that he and 2024.  Current medicines are reviewed at length with the patient today.  The patient does not have concerns regarding medicines.  The following changes have been made: As above  Labs/ tests ordered today include:    Orders Placed This Encounter  Procedures   Lipid panel   EKG 12-Lead      Disposition:   FU with me in 6 months.    Signed, Minus Breeding, MD  04/12/2022 10:25 AM    [facility]

## 2022-04-12 ENCOUNTER — Ambulatory Visit: Payer: Medicare Other | Admitting: Cardiology

## 2022-04-12 ENCOUNTER — Encounter: Payer: Self-pay | Admitting: Cardiology

## 2022-04-12 VITALS — BP 118/58 | HR 61 | Ht 64.0 in | Wt 92.6 lb

## 2022-04-12 DIAGNOSIS — I480 Paroxysmal atrial fibrillation: Secondary | ICD-10-CM | POA: Diagnosis not present

## 2022-04-12 DIAGNOSIS — I10 Essential (primary) hypertension: Secondary | ICD-10-CM

## 2022-04-12 DIAGNOSIS — I7789 Other specified disorders of arteries and arterioles: Secondary | ICD-10-CM | POA: Diagnosis not present

## 2022-04-12 DIAGNOSIS — E785 Hyperlipidemia, unspecified: Secondary | ICD-10-CM

## 2022-04-12 DIAGNOSIS — I351 Nonrheumatic aortic (valve) insufficiency: Secondary | ICD-10-CM | POA: Diagnosis not present

## 2022-04-12 DIAGNOSIS — I77819 Aortic ectasia, unspecified site: Secondary | ICD-10-CM

## 2022-04-12 DIAGNOSIS — R931 Abnormal findings on diagnostic imaging of heart and coronary circulation: Secondary | ICD-10-CM | POA: Diagnosis not present

## 2022-04-12 MED ORDER — PRAVASTATIN SODIUM 20 MG PO TABS: 20.0000 mg | ORAL_TABLET | Freq: Every evening | ORAL | 3 refills | Status: DC

## 2022-04-12 NOTE — Patient Instructions (Signed)
Medication Instructions:   START PRAVASTATIN 20 MG ONCE DAILY  *If you need a refill on your cardiac medications before your next appointment, please call your pharmacy*   Lab Work:  Your physician recommends that you return for lab work in: [place]  If you have labs (blood work) drawn today and your tests are completely normal, you will receive your results only by: [place] (if you have MyChart) OR A paper copy in the mail If you have any lab test that is abnormal or we need to change your treatment, we will call you to review the results.   Follow-Up: At [facility], you and your health needs are our priority.  As part of our continuing mission to provide you with exceptional heart care, we have created designated Provider Care Teams.  These Care Teams include your primary Cardiologist (physician) and Advanced Practice Providers (APPs -  Physician Assistants and Nurse Practitioners) who all work together to provide you with the care you need, when you need it.  We recommend signing up for the patient portal called "MyChart".  Sign up information is provided on this After Visit Summary.  MyChart is used to connect with patients for Virtual Visits (Telemedicine).  Patients are able to view lab/test results, encounter notes, upcoming appointments, etc.  Non-urgent messages can be sent to your provider as well.   To learn more about what you can do with MyChart, go to [email or web address].    Your next appointment:   6 month(s)  The format for your next appointment:   In Person  Provider:   Minus Breeding, MD       Important Information About Sugar

## 2022-04-17 ENCOUNTER — Encounter: Payer: Self-pay | Admitting: Emergency Medicine

## 2022-04-17 ENCOUNTER — Ambulatory Visit: Payer: Medicare Other | Admitting: Student

## 2022-04-17 ENCOUNTER — Encounter: Payer: Self-pay | Admitting: Student

## 2022-04-17 ENCOUNTER — Other Ambulatory Visit: Payer: Self-pay | Admitting: Cardiology

## 2022-04-17 VITALS — BP 122/66 | HR 67 | Ht 64.0 in | Wt 92.6 lb

## 2022-04-17 DIAGNOSIS — I351 Nonrheumatic aortic (valve) insufficiency: Secondary | ICD-10-CM | POA: Diagnosis not present

## 2022-04-17 DIAGNOSIS — I480 Paroxysmal atrial fibrillation: Secondary | ICD-10-CM | POA: Diagnosis not present

## 2022-04-17 DIAGNOSIS — I10 Essential (primary) hypertension: Secondary | ICD-10-CM

## 2022-04-17 LAB — MAGNESIUM: Magnesium: 2.2 mg/dL (ref 1.6–2.3)

## 2022-04-17 LAB — BASIC METABOLIC PANEL
BUN/Creatinine Ratio: 27 (ref 12–28)
BUN: 19 mg/dL (ref 8–27)
CO2: 28 mmol/L (ref 20–29)
Calcium: 10.2 mg/dL (ref 8.7–10.3)
Chloride: 100 mmol/L (ref 96–106)
Creatinine, Ser: 0.7 mg/dL (ref 0.57–1.00)
Glucose: 70 mg/dL (ref 70–99)
Potassium: 4.4 mmol/L (ref 3.5–5.2)
Sodium: 141 mmol/L (ref 134–144)
eGFR: 94 mL/min/{1.73_m2} (ref >59)

## 2022-04-17 NOTE — Patient Instructions (Signed)
Medication Instructions:  Your physician recommends that you continue on your current medications as directed. Please refer to the Current Medication list given to you today.  *If you need a refill on your cardiac medications before your next appointment, please call your pharmacy*   Lab Work: TODAY: BMET, Mag  If you have labs (blood work) drawn today and your tests are completely normal, you will receive your results only by: [place] (if you have MyChart) OR A paper copy in the mail If you have any lab test that is abnormal or we need to change your treatment, we will call you to review the results.   Follow-Up: At [facility], you and your health needs are our priority.  As part of our continuing mission to provide you with exceptional heart care, we have created designated Provider Care Teams.  These Care Teams include your primary Cardiologist (physician) and Advanced Practice Providers (APPs -  Physician Assistants and Nurse Practitioners) who all work together to provide you with the care you need, when you need it.  Your next appointment:   6 month(s)  The format for your next appointment:   In Person  Provider:   Allegra Lai, MD

## 2022-04-17 NOTE — Telephone Encounter (Signed)
Mychart message sent by pt: [name] [name] [facility] (supporting Collene Gobble, MD) 6 minutes ago (2:58 PM)   Dr. Lamonte Sakai: Around [facility] Day weekend I caught a cold. Although the cold healed fairly quickly, the coo ugh continues and seems worse than before. What should I do? I have not tried the Omeprazole and Loratadine because I was consulting the other prescribing caretakers how to take it to  be effective while not interfering with my other medications. Plus, as you can probably see from my chart, Dr. Percival Spanish prescribed a statin drug and I started taking that. Should I still try them? And how many minutes shouldI wait to eat after taking the Omeprazole?  Thank you for your guidance and expertise.   [name] [name], [phone]    Dr. Lamonte Sakai, please advise.

## 2022-04-20 DIAGNOSIS — Z681 Body mass index (BMI) 19 or less, adult: Secondary | ICD-10-CM | POA: Diagnosis not present

## 2022-04-27 ENCOUNTER — Encounter: Payer: Self-pay | Admitting: Emergency Medicine

## 2022-04-27 DIAGNOSIS — L298 Other pruritus: Secondary | ICD-10-CM | POA: Diagnosis not present

## 2022-04-27 DIAGNOSIS — L814 Other melanin hyperpigmentation: Secondary | ICD-10-CM | POA: Diagnosis not present

## 2022-04-27 DIAGNOSIS — D225 Melanocytic nevi of trunk: Secondary | ICD-10-CM | POA: Diagnosis not present

## 2022-04-27 DIAGNOSIS — I872 Venous insufficiency (chronic) (peripheral): Secondary | ICD-10-CM | POA: Diagnosis not present

## 2022-04-27 DIAGNOSIS — L538 Other specified erythematous conditions: Secondary | ICD-10-CM | POA: Diagnosis not present

## 2022-04-27 DIAGNOSIS — L820 Inflamed seborrheic keratosis: Secondary | ICD-10-CM | POA: Diagnosis not present

## 2022-04-27 DIAGNOSIS — L821 Other seborrheic keratosis: Secondary | ICD-10-CM | POA: Diagnosis not present

## 2022-04-27 NOTE — Telephone Encounter (Signed)
I would be ok if she tries stopping the meds sequentially to see if it changes her cough or any symptoms:  Have her stop the omeprazole on day 14. Continue the loratadine for another 2 weeks and then stop it as well. Keep track of how her sx change as she comes off these meds.

## 2022-05-02 IMAGING — CT CT TIBIA FIBULA *L* W/O CM
1 series · 12 of 14 positions shown, 15 images · non-contrast
Comparison: X-ray 07/14/2020

CLINICAL DATA: Left leg pain for 1 month.  Abnormal x-ray

EXAM:
CT OF THE LOWER LEFT EXTREMITY WITHOUT CONTRAST
TECHNIQUE: Multidetector CT imaging of the lower left extremity was performed
according to the standard protocol.

[Series 4: soft tissue lower extremity · axial · 0.37mm/px · z∈[-498,-102]mm · 12 of 236 slices shown, 15 images]
[im 19/236  soft-tissue]
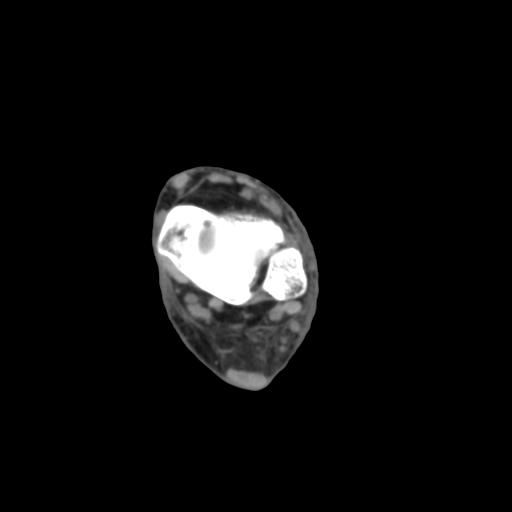
[im 19/236  bone]
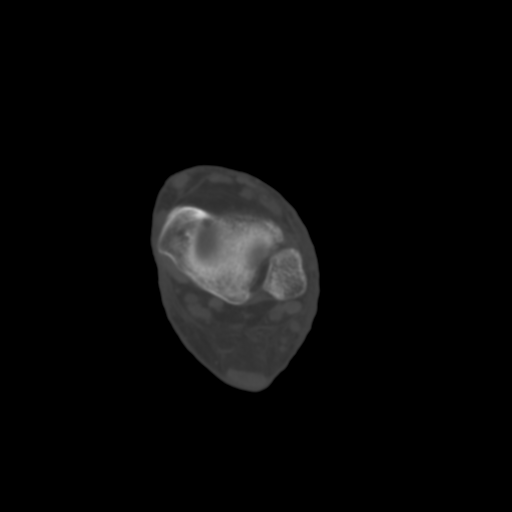
[im 37/236  bone]
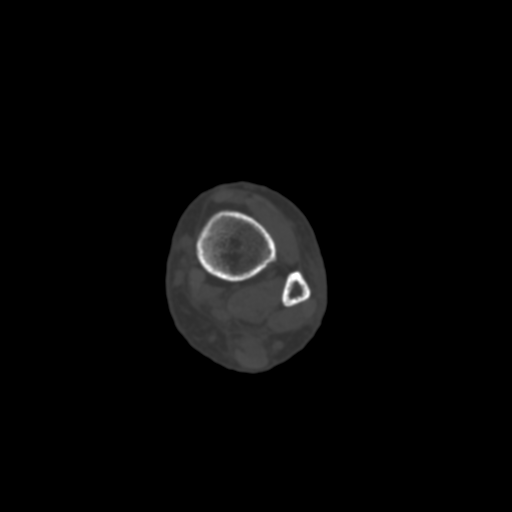
[im 55/236  bone]
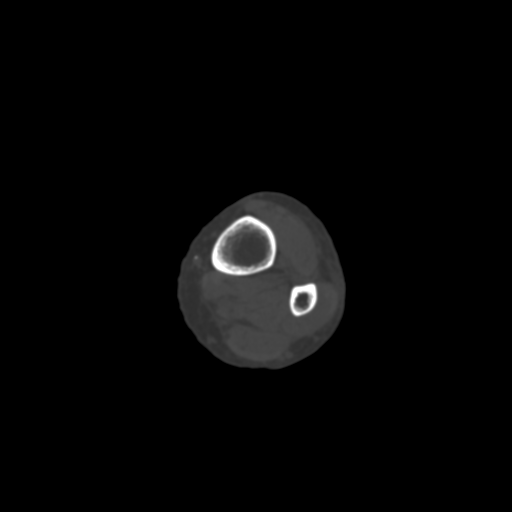
[im 73/236  bone]
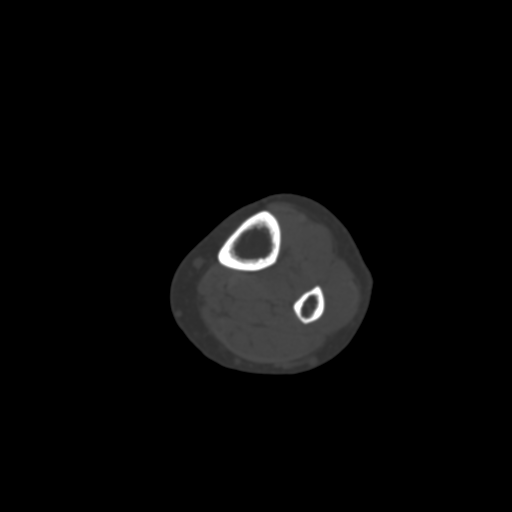
[im 91/236  soft-tissue]
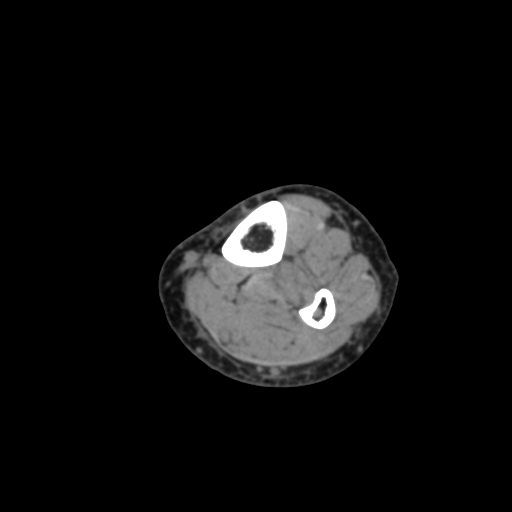
[im 91/236  bone]
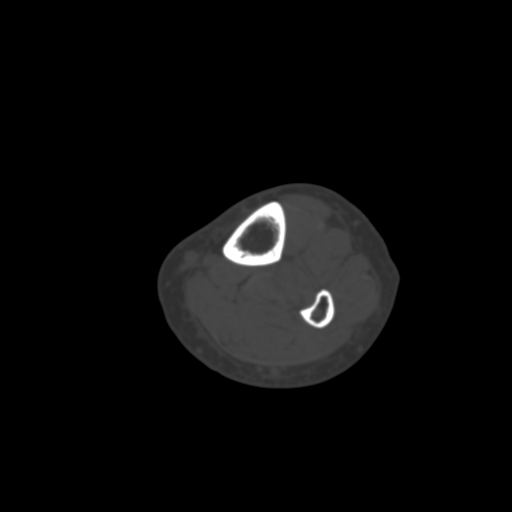
[im 109/236  bone]
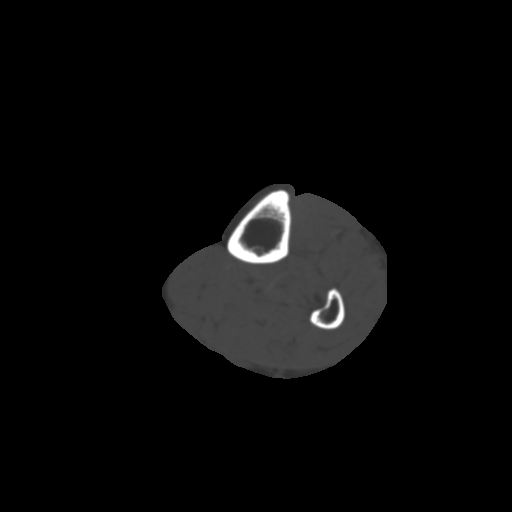
[im 127/236  bone]
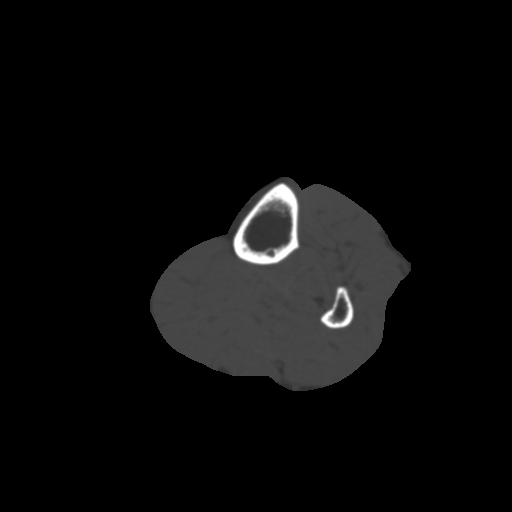
[im 145/236  bone]
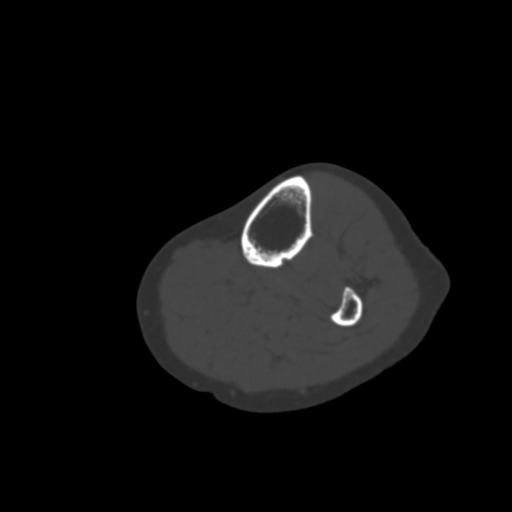
[im 163/236  soft-tissue]
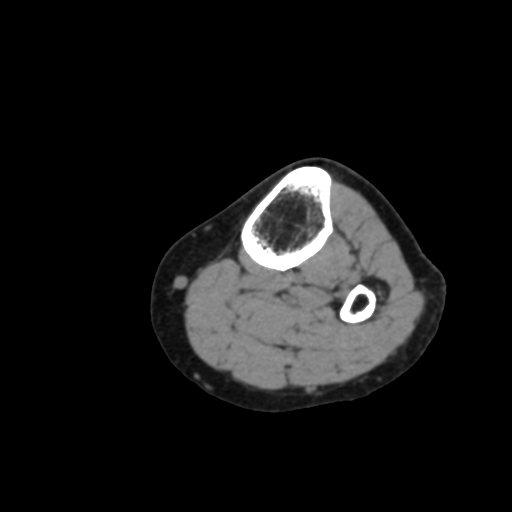
[im 163/236  bone]
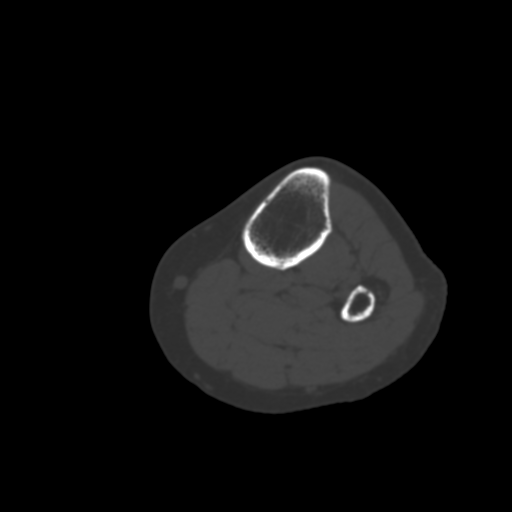
[im 181/236  bone]
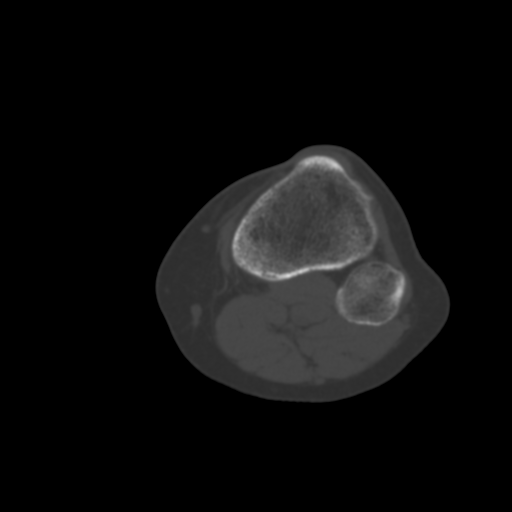
[im 199/236  bone]
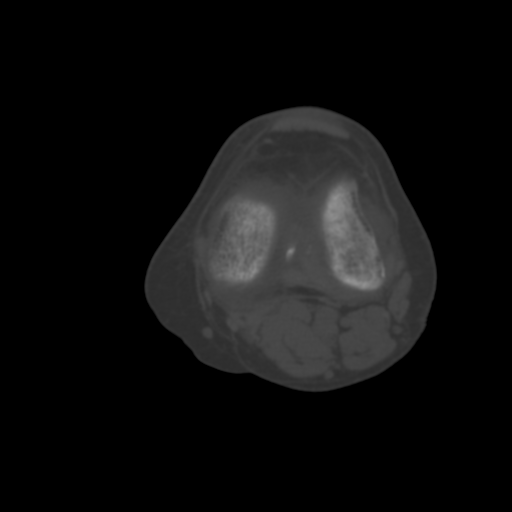
[im 217/236  bone]
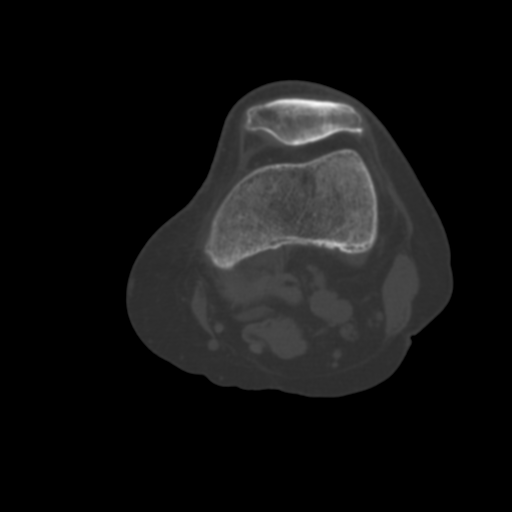

[12 of 14 positions shown; findings below may reference images not displayed]

FINDINGS: Bones/Joint/Cartilage

There is a focal bony protuberance emanating from the anterolateral
cortex of the mid left tibial diaphysis measuring approximately
x 0.4 x 0.5 cm (series 3, image 129; series 8, image 44). This
lesion is smoothly marginated and is homogeneously isodense relative
to adjacent cortical bone. No central lucent nidus. No adjacent
periosteal elevation. No evidence of soft tissue mass. No abnormal
density within the adjacent medullary space.

No acute fracture. No dislocation. Alignment at the knee and ankle
are maintained. No significant arthropathy. No knee joint effusion.

Ligaments

Suboptimally assessed by CT.

Muscles and Tendons

The visualized musculotendinous structures appear unremarkable.

Soft tissues

Mild subcutaneous edema at the lateral aspect of the ankle,
nonspecific. No soft tissue fluid collection. No evidence of soft
tissue mass. Scattered vascular calcification.
IMPRESSION: 1. Focal bony protuberance emanating from the anterolateral cortex
of the mid left tibial diaphysis measuring up to 1.7 cm. This lesion
is smoothly marginated and is homogeneously isodense relative to
adjacent cortical bone. No aggressive features by CT. Findings are
favored to represent a benign osteoma. Although felt to be less
likely, a focal stress reaction could have a similar appearance.
2. Mild subcutaneous edema at the lateral aspect of the ankle,
nonspecific.

## 2022-05-03 NOTE — Progress Notes (Unsigned)
    Benito Mccreedy D.[place] Phone: [phone]   Assessment and Plan:     There are no diagnoses linked to this encounter.  ***   Pertinent previous records reviewed include ***   Follow Up: ***     Subjective:   I, Roshan Salamon, am serving as a Education administrator for Doctor Glennon Mac   Chief Complaint: right thigh pain    HPI:  04/06/2022 Patient is a 71 year old female complaining of right thigh pain. Patient states that for about 7 weeks to 2 months pain on the right from the groin to above the knee it makes her slower to walk, feels like she is limping, no MOI, no numbness or tingling, has not been taking meds , no creams, feels like she has to tell her leg to go not a severe problem just annoying    05/04/2022 Patient states    Relevant Historical Information: Hypertension, paroxysmal atrial fibrillation, chronically anticoagulated with Eliquis  Additional pertinent review of systems negative.   Current Outpatient Medications:    acetaminophen (TYLENOL) 500 MG tablet, Take 1,000 mg by mouth every 6 (six) hours as needed for moderate pain., Disp: , Rfl:    alendronate (FOSAMAX) 70 MG tablet, Take 1 tablet (70 mg total) by mouth every Saturday. TAKE 1 TABLET BY MOUTH ONCE A WEEK WITH A FULL GLASS OF WATER ON AN EMPTY STOMACH, Disp: , Rfl:    apixaban (ELIQUIS) 5 MG TABS tablet, TAKE 1 TABLET BY MOUTH  TWICE DAILY, Disp: 180 tablet, Rfl: 1   Calcium Citrate-Vitamin D (CITRACAL + D PO), Take by mouth. 1 per day, Disp: , Rfl:    Cholecalciferol (VITAMIN D3) 50 MCG (2000 UT) TABS, Take by mouth. 1 per day, Disp: , Rfl:    diltiazem (CARDIZEM) 30 MG tablet, Take 1 tablet every 4 hours AS NEEDED for breakthrough AFIB, Disp: 45 tablet, Rfl: 1   diltiazem (TIAZAC) 300 MG 24 hr capsule, TAKE 1 CAPSULE BY MOUTH  DAILY, Disp: 90 capsule, Rfl: 3   dofetilide (TIKOSYN) 250 MCG capsule, Take 1 capsule (250 mcg total)  by mouth 2 (two) times daily., Disp: 180 capsule, Rfl: 3   LORazepam (ATIVAN) 0.5 MG tablet, Take 0.5 mg by mouth daily as needed for anxiety (when flying)., Disp: , Rfl:    metoprolol tartrate (LOPRESSOR) 25 MG tablet, TAKE 1 TABLET BY MOUTH TWICE  DAILY, Disp: 180 tablet, Rfl: 3   Multiple Vitamins-Minerals (PRESERVISION AREDS PO), Take 1 capsule by mouth in the morning and at bedtime., Disp: , Rfl:    potassium chloride (KLOR-CON) 10 MEQ tablet, Take 2 tablets (20 mEq total) by mouth daily., Disp: 180 tablet, Rfl: 3   pravastatin (PRAVACHOL) 20 MG tablet, Take 1 tablet (20 mg total) by mouth every evening., Disp: 90 tablet, Rfl: 3   Objective:     There were no vitals filed for this visit.    There is no height or weight on file to calculate BMI.    Physical Exam:    ***   Electronically signed by:  Benito Mccreedy D.[facility] 10:55 AM 05/03/22

## 2022-05-04 ENCOUNTER — Ambulatory Visit: Payer: Medicare Other | Admitting: Sports Medicine

## 2022-05-04 VITALS — HR 62 | Ht 64.0 in | Wt 92.0 lb

## 2022-05-04 DIAGNOSIS — S76111A Strain of right quadriceps muscle, fascia and tendon, initial encounter: Secondary | ICD-10-CM | POA: Diagnosis not present

## 2022-05-04 NOTE — Patient Instructions (Addendum)
Good to see you  Pt referral  Tylenol [phone] mg 2-3 times a day for pain relief  Voltaren gel topically as needed  4 week follow up

## 2022-05-12 ENCOUNTER — Other Ambulatory Visit (HOSPITAL_COMMUNITY): Payer: Self-pay | Admitting: Physician Assistant

## 2022-05-12 ENCOUNTER — Telehealth (HOSPITAL_COMMUNITY): Payer: Self-pay

## 2022-05-12 NOTE — Telephone Encounter (Signed)
Patient left message on voicemail questions about how to take Diltiazem '30mg'$ . Informed her that she can take 1 tablet every 4 hours as needed and with or without food.

## 2022-05-16 ENCOUNTER — Ambulatory Visit: Payer: Medicare Other | Attending: Sports Medicine

## 2022-05-16 ENCOUNTER — Other Ambulatory Visit: Payer: Self-pay

## 2022-05-16 DIAGNOSIS — M79604 Pain in right leg: Secondary | ICD-10-CM | POA: Diagnosis not present

## 2022-05-16 DIAGNOSIS — R262 Difficulty in walking, not elsewhere classified: Secondary | ICD-10-CM | POA: Insufficient documentation

## 2022-05-16 DIAGNOSIS — M6281 Muscle weakness (generalized): Secondary | ICD-10-CM | POA: Insufficient documentation

## 2022-05-16 DIAGNOSIS — S76111A Strain of right quadriceps muscle, fascia and tendon, initial encounter: Secondary | ICD-10-CM | POA: Diagnosis not present

## 2022-05-16 NOTE — Therapy (Signed)
OUTPATIENT PHYSICAL THERAPY LOWER EXTREMITY EVALUATION   Patient Name: [name] [name] MRN: [id number] DOB:1951-10-18, 71 y.o., female Today's Date: 05/16/2022   PT End of Session - 05/16/22 0912     Visit Number 1    Number of Visits 9    Date for PT Re-Evaluation 07/18/22    Authorization Type UHC MCR    Authorization Time Period FOTO v6, v10, kx mod v15    PT Start Time 0830    PT Stop Time 0915    PT Time Calculation (min) 45 min    Activity Tolerance Patient tolerated treatment well    Behavior During Therapy [facility] for tasks assessed/performed             Past Medical History:  Diagnosis Date   Allergic rhinitis    Aortic insufficiency    CAT SCRATCH 09/10/2009   Qualifier: Diagnosis of  By: Regis Bill MD, [name]    Clavicle fracture 12/2006   left   Diverticulosis    HTN (hypertension)    Osteopenia    Paroxysmal atrial fibrillation (HCC)    Pneumonia    PONV (postoperative nausea and vomiting)    Vision disturbance 03/13/2013   Negative neuro MRA MRI   Past Surgical History:  Procedure Laterality Date   ABDOMINAL HYSTERECTOMY     pt denies 04/15/14   APPENDECTOMY     ATRIAL FIBRILLATION ABLATION N/A 02/01/2021   Procedure: ATRIAL FIBRILLATION ABLATION;  Surgeon: Thompson Grayer, MD;  Location: [place] [facility];  Service: Cardiovascular;  Laterality: N/A;   BRONCHIAL WASHINGS  09/05/2021   Procedure: BRONCHIAL WASHINGS;  Surgeon: Collene Gobble, MD;  [facility];  Service: Cardiopulmonary;;   LAPAROSCOPIC SALPINGO OOPHERECTOMY Right    TONSILLECTOMY AND ADENOIDECTOMY     VIDEO BRONCHOSCOPY N/A 09/05/2021   Procedure: VIDEO BRONCHOSCOPY WITHOUT FLUORO;  Surgeon: Collene Gobble, MD;  Location: [place];  Service: Cardiopulmonary;  Laterality: N/A;   Patient Active Problem List   Diagnosis Date Noted   Acquired dilation of ascending aorta and aortic root ([place]) 04/11/2022   Dyslipidemia 04/11/2022   Elevated coronary artery calcium score 04/11/2022    Enlarged aorta ([place]) 04/11/2022   URI (upper respiratory infection) 11/01/2021   Intermediate stage nonexudative age-related macular degeneration of both eyes 09/06/2021   Bronchiectasis without complication ([place]) 62/83/6629   Cough 08/09/2021   Pneumonia due to COVID-19 virus 07/05/2021   Secondary hypercoagulable state ([place]) 05/31/2021   Paroxysmal A-fib ([place]) 05/31/2021   Posterior capsular opacification non visually significant of left eye 07/19/2020   Left epiretinal membrane 07/19/2020   Right epiretinal membrane 07/19/2020   Low back pain 07/14/2020   Pain in left leg 07/14/2020   Educated about COVID-19 virus infection 05/30/2020   Hypercholesterolemia 03/11/2020   Atrial fibrillation ([place]) 03/11/2020   Sensorineural hearing loss (SNHL), bilateral 03/21/2018   Tinnitus of both ears 03/21/2018   Hypertensive disorder 06/30/2017   Osteoporosis 10/19/2016   Paroxysmal atrial fibrillation ([place]) 09/27/2015   Special screening for malignant neoplasms, colon 04/16/2014   Hemorrhoids, unspecified hemorrhoid type 04/16/2014   Foot callus 05/14/2013   Bilateral bunions 03/13/2013   Hypertrophic toenail 03/13/2013   Vision disturbance 03/13/2013   Post-nasal drainage 05/05/2012   Aortic insufficiency 01/16/2012   Macular degeneration 10/12/2011   ANXIETY, SITUATIONAL 05/08/2008   Vitamin D insufficiency 05/22/2007   ELEVATED BLOOD PRESSURE WITHOUT DIAGNOSIS OF HYPERTENSION 05/22/2007   Allergic rhinitis due to allergen 03/28/2007    PCP: Burnis Medin, MD  REFERRING PROVIDER: Glennon Mac, DO  REFERRING DIAG: N39.767H (ICD-10-CM) - Strain of right quadriceps, initial encounter  THERAPY DIAG:  Pain in right leg  Muscle weakness (generalized)  Difficulty in walking, not elsewhere classified  Rationale for Evaluation and Treatment Rehabilitation  ONSET DATE: a few months ago  SUBJECTIVE:   SUBJECTIVE STATEMENT: Pt reports primary c/o Rt anterior thigh pain  spanning from her groin to her lateral knee. She does not recall any MOI and reports that the pain is insidious in nature. She reports that she typically ignores pain until it becomes persistent, which is why she seeking care at this time. Pt reports she walks track about 15 minutes 3-4 days per week, and she feels slower and like her gait is impaired with this activity currently. She denies any N/T related to this problem, although she reports feeling week in her Rt LE after prolonged standing. Aggravating factors include walking > 5 minutes, prolonged standing > 5 minutes. Easing factors include acetaminophen (used sparingly), massage. Pt denies any saddle anesthesia, unexplained weight change, nausea/ vomiting, or change in bowel/ bladder function.  PERTINENT HISTORY: Osteopenia (borderline osteoporosis), aortic insufficiency, A fib, HTN  PAIN:  Are you having pain? Yes: NPRS scale: 0/10 Pain location: Rt anterior thigh Pain description: Tight Aggravating factors: walking > 5 minutes, prolonged standing > 5 minutes Relieving factors: acetaminophen (used sparingly), massage  PRECAUTIONS: None  WEIGHT BEARING RESTRICTIONS No  FALLS:  Has patient fallen in last 6 months? No  LIVING ENVIRONMENT: Lives with: lives with their spouse Lives in: House/apartment Stairs: Yes: Internal: 14 steps; on right going up Has following equipment at home: Grab bars  OCCUPATION: Retired Technical sales engineer  PLOF: Independent  PATIENT GOALS Return to walking for exercise, ADLs   OBJECTIVE:   DIAGNOSTIC FINDINGS: N/A  PATIENT SURVEYS:  FOTO 61%, predicted 73% in 12 visits  COGNITION:  Overall cognitive status: Within functional limits for tasks assessed     SENSATION: Not tested   MUSCLE LENGTH: Hamstring 90/90 test: 50 degrees shy of full extension BIL Ely's test: Severely limited and painful BIL Ober's test: Severe limitation on Rt  POSTURE: weight shift left  PALPATION: TTP throughout  Rt IT band, adductors  LOWER EXTREMITY ROM:  A/PROM Right eval Left eval  Hip abduction 40d, tightness   Hip adduction    Knee flexion WNL WNL  Knee extension WNL WNL   (Blank rows = not tested)  LOWER EXTREMITY MMT:  MMT Right eval Left eval  Hip flexion 3+/5 3+/5  Hip extension 3/5 3/5  Hip abduction 3/5 3/5  Hip adduction 3/5 3/5  Hip internal rotation 4/5 4/5  Hip external rotation 4/5 4/5  Knee flexion 4/5 4/5  Knee extension 4/5 4/5   (Blank rows = not tested)   FUNCTIONAL TESTS:  5xSTS: 15 seconds Squat: WNL SLS: WNL BIL, (+) Trendelenburg Lunge: Increased tension through Rt quad with Rt leg back  GAIT: Distance walked: 20 ft Assistive device utilized: None Level of assistance: Complete Independence Comments: Decreased gait speed, mild Rt antalgic gait    TODAY'S TREATMENT: 05/16/2022: Demonstrated and issued HEP   PATIENT EDUCATION:  Education details: Pt educated on probable underlying pathophysiology, POC, prognosis, FOTO, and HEP Person educated: Patient Education method: Explanation, Demonstration, and Handouts Education comprehension: verbalized understanding and returned demonstration   HOME EXERCISE PROGRAM: Access Code: [email or web address] URL: [email or web address] Date: 05/16/2022 Prepared by: Vanessa [place]  Exercises - Butterfly Groin Stretch  - 1 x daily - 7 x weekly -  2-min hold Education officer, environmental with Table and Chair Support  - 1 x daily - 7 x weekly - 2-min hold - Standing ITB Stretch  - 1 x daily - 7 x weekly - 2-min hold - Mini Squat with Counter Support  - 1 x daily - 7 x weekly - 3 sets - 12 reps - Sidelying Hip Abduction  - 1 x daily - 7 x weekly - 2 sets - 10 reps - 5-sec hold  ASSESSMENT:  CLINICAL IMPRESSION: Patient is a 71yo y.o. F who was seen today for physical therapy evaluation and treatment for subacute Rt anterior thigh and groin pain. Upon assessment, her primary impairments include weak BIL  global hip and knee strength, severely tight hamstrings, quads, and IT bands, TTP to Rt IT band and adductors, and limited 5xSTS and Rt lunge. Ruling up subacute adductor/ quadriceps strain due to areas of tenderness, very tight and pain quadriceps, tight adductors, and report of pain with walking/ standing. Cannot rule out IT band contribution due to lateral knee pain and positive Ober's test. Pt will benefit from skilled PT to address her primary impairments and return to her prior level of function with less limitation.     OBJECTIVE IMPAIRMENTS Abnormal gait, decreased balance, decreased endurance, decreased mobility, difficulty walking, decreased ROM, decreased strength, hypomobility, impaired flexibility, improper body mechanics, postural dysfunction, and pain.   ACTIVITY LIMITATIONS bending, standing, squatting, stairs, and locomotion level  PARTICIPATION LIMITATIONS: laundry, shopping, community activity, and yard work  PERSONAL FACTORS 3+ comorbidities: See medical hx  are also affecting patient's functional outcome.   REHAB POTENTIAL: Good  CLINICAL DECISION MAKING: Stable/uncomplicated  EVALUATION COMPLEXITY: Low   GOALS: Goals reviewed with patient? Yes  SHORT TERM GOALS: Target date: 06/13/2022  Pt will report understanding and adherence to initial HEP in order to promote independence in the management of primary impairments. Baseline: HEP provided at eval Goal status: INITIAL   LONG TERM GOALS: Target date: 07/11/2022   Pt will achieve a FOTO score of 73% in order to demonstrate improved functional ability as it relates to her primary impairments.  Baseline: 61% Goal status: INITIAL  2.  Pt will report ability to walk >15 minutes with 0/10 pain in order to return to walking track with less limitation. Baseline: >4/10 pain with 5 minutes of walking Goal status: INITIAL  3.  Pt will achieve global BIL hip and knee strength of 4+/5 or greater in order to perform ADLs  such as getting down on the floor to clean under her couch with less limitation. Baseline: See MMT chart Goal status: INITIAL  4.  Pt will achieve a 5xSTS in 12 seconds or less in order to demonstrate safe transfers for community activity.  Baseline: 15 seconds Goal status: INITIAL     PLAN: PT FREQUENCY: 1x/week  PT DURATION: 8 weeks  PLANNED INTERVENTIONS: Therapeutic exercises, Therapeutic activity, Neuromuscular re-education, Balance training, Gait training, Patient/Family education, Self Care, Joint mobilization, Stair training, Orthotic/Fit training, Aquatic Therapy, Dry Needling, Electrical stimulation, Spinal mobilization, Cryotherapy, Moist heat, Taping, Biofeedback, Ionotophoresis '4mg'$ /ml Dexamethasone, Manual therapy, and Re-evaluation  PLAN FOR NEXT SESSION: Progress quad/ adductor eccentrics, global LE stretching   Vanessa [place], PT, DPT 05/16/22 9:43 AM

## 2022-05-22 ENCOUNTER — Other Ambulatory Visit: Payer: Self-pay | Admitting: Internal Medicine

## 2022-05-23 ENCOUNTER — Ambulatory Visit: Payer: Medicare Other

## 2022-05-23 ENCOUNTER — Telehealth: Payer: Self-pay | Admitting: Pharmacist

## 2022-05-23 DIAGNOSIS — M79604 Pain in right leg: Secondary | ICD-10-CM

## 2022-05-23 DIAGNOSIS — M6281 Muscle weakness (generalized): Secondary | ICD-10-CM

## 2022-05-23 DIAGNOSIS — R262 Difficulty in walking, not elsewhere classified: Secondary | ICD-10-CM

## 2022-05-23 DIAGNOSIS — S76111A Strain of right quadriceps muscle, fascia and tendon, initial encounter: Secondary | ICD-10-CM | POA: Diagnosis not present

## 2022-05-23 NOTE — Therapy (Signed)
OUTPATIENT PHYSICAL THERAPY TREATMENT NOTE   Patient Name: [name] [name] MRN: [id number] DOB:1951-05-11, 71 y.o., female Today's Date: 05/23/2022  PCP: Burnis Medin, MD REFERRING PROVIDER: Glennon Mac, DO  END OF SESSION:   PT End of Session - 05/23/22 1350     Visit Number 2    Number of Visits 9    Date for PT Re-Evaluation 07/18/22    Authorization Type UHC MCR    Authorization Time Period FOTO v6, v10, kx mod v15    PT Start Time 5621    PT Stop Time 3086    PT Time Calculation (min) 42 min    Activity Tolerance Patient tolerated treatment well    Behavior During Therapy [facility] for tasks assessed/performed             Past Medical History:  Diagnosis Date   Allergic rhinitis    Aortic insufficiency    CAT SCRATCH 09/10/2009   Qualifier: Diagnosis of  By: Regis Bill MD, [name]    Clavicle fracture 12/2006   left   Diverticulosis    HTN (hypertension)    Osteopenia    Paroxysmal atrial fibrillation (HCC)    Pneumonia    PONV (postoperative nausea and vomiting)    Vision disturbance 03/13/2013   Negative neuro MRA MRI   Past Surgical History:  Procedure Laterality Date   ABDOMINAL HYSTERECTOMY     pt denies 04/15/14   APPENDECTOMY     ATRIAL FIBRILLATION ABLATION N/A 02/01/2021   Procedure: ATRIAL FIBRILLATION ABLATION;  Surgeon: Thompson Grayer, MD;  Location: [place] [facility];  Service: Cardiovascular;  Laterality: N/A;   BRONCHIAL WASHINGS  09/05/2021   Procedure: BRONCHIAL WASHINGS;  Surgeon: Collene Gobble, MD;  [facility];  Service: Cardiopulmonary;;   LAPAROSCOPIC SALPINGO OOPHERECTOMY Right    TONSILLECTOMY AND ADENOIDECTOMY     VIDEO BRONCHOSCOPY N/A 09/05/2021   Procedure: VIDEO BRONCHOSCOPY WITHOUT FLUORO;  Surgeon: Collene Gobble, MD;  Location: [place] [facility];  Service: Cardiopulmonary;  Laterality: N/A;   Patient Active Problem List   Diagnosis Date Noted   Acquired dilation of ascending aorta and aortic root ([place]) 04/11/2022    Dyslipidemia 04/11/2022   Elevated coronary artery calcium score 04/11/2022   Enlarged aorta ([place]) 04/11/2022   URI (upper respiratory infection) 11/01/2021   Intermediate stage nonexudative age-related macular degeneration of both eyes 09/06/2021   Bronchiectasis without complication ([place]) 57/84/6962   Cough 08/09/2021   Pneumonia due to COVID-19 virus 07/05/2021   Secondary hypercoagulable state ([place]) 05/31/2021   Paroxysmal A-fib ([place]) 05/31/2021   Posterior capsular opacification non visually significant of left eye 07/19/2020   Left epiretinal membrane 07/19/2020   Right epiretinal membrane 07/19/2020   Low back pain 07/14/2020   Pain in left leg 07/14/2020   Educated about COVID-19 virus infection 05/30/2020   Hypercholesterolemia 03/11/2020   Atrial fibrillation ([place]) 03/11/2020   Sensorineural hearing loss (SNHL), bilateral 03/21/2018   Tinnitus of both ears 03/21/2018   Hypertensive disorder 06/30/2017   Osteoporosis 10/19/2016   Paroxysmal atrial fibrillation ([place]) 09/27/2015   Special screening for malignant neoplasms, colon 04/16/2014   Hemorrhoids, unspecified hemorrhoid type 04/16/2014   Foot callus 05/14/2013   Bilateral bunions 03/13/2013   Hypertrophic toenail 03/13/2013   Vision disturbance 03/13/2013   Post-nasal drainage 05/05/2012   Aortic insufficiency 01/16/2012   Macular degeneration 10/12/2011   ANXIETY, SITUATIONAL 05/08/2008   Vitamin D insufficiency 05/22/2007   ELEVATED BLOOD PRESSURE WITHOUT DIAGNOSIS OF HYPERTENSION 05/22/2007   Allergic  rhinitis due to allergen 03/28/2007    REFERRING DIAG: Z66.294T (ICD-10-CM) - Strain of right quadriceps, initial encounter  THERAPY DIAG:  Pain in right leg  Muscle weakness (generalized)  Difficulty in walking, not elsewhere classified  Rationale for Evaluation and Treatment Rehabilitation  PERTINENT HISTORY: Osteopenia (borderline osteoporosis), aortic insufficiency, A fib, HTN  SUBJECTIVE: Pt  reports adherence to her HEP  PAIN:  Are you having pain? Yes: NPRS scale: 0/10 Pain location: Rt anterior thigh Pain description: Tight Aggravating factors: walking > 5 minutes, prolonged standing > 5 minutes Relieving factors: acetaminophen (used sparingly), massage   OBJECTIVE: (objective measures completed at initial evaluation unless otherwise dated)   DIAGNOSTIC FINDINGS: N/A   PATIENT SURVEYS:  FOTO 61%, predicted 73% in 12 visits   COGNITION:           Overall cognitive status: Within functional limits for tasks assessed                          SENSATION: Not tested     MUSCLE LENGTH: Hamstring 90/90 test: 50 degrees shy of full extension BIL Ely's test: Severely limited and painful BIL Ober's test: Severe limitation on Rt   POSTURE: weight shift left   PALPATION: TTP throughout Rt IT band, adductors   LOWER EXTREMITY ROM:   A/PROM Right eval Left eval  Hip abduction 40d, tightness    Hip adduction      Knee flexion WNL WNL  Knee extension WNL WNL   (Blank rows = not tested)   LOWER EXTREMITY MMT:   MMT Right eval Left eval  Hip flexion 3+/5 3+/5  Hip extension 3/5 3/5  Hip abduction 3/5 3/5  Hip adduction 3/5 3/5  Hip internal rotation 4/5 4/5  Hip external rotation 4/5 4/5  Knee flexion 4/5 4/5  Knee extension 4/5 4/5   (Blank rows = not tested)     FUNCTIONAL TESTS:  5xSTS: 15 seconds Squat: WNL SLS: WNL BIL, (+) Trendelenburg Lunge: Increased tension through Rt quad with Rt leg back   GAIT: Distance walked: 20 ft Assistive device utilized: None Level of assistance: Complete Independence Comments: Decreased gait speed, mild Rt antalgic gait       TODAY'S TREATMENT:  [facility] Adult PT Treatment:                                                DATE: 05/23/2022 Therapeutic Exercise: Standing IT band stretch x52mn on Rt Standing Rt hip adduction with slow eccentric return with 3# cable to ankle at Free Motion machine 2x10  Standing  Rt hip flexion with slow eccentric return with 3# cable to ankle at Free Motion machine 2x10  Bridge with YTB clam 2x15 Romanian deadlift with 15# kettlebell 3x8 SL balance clocks 2x5 cycles BIL Side lunge with 5# kettlebell 2x10 with 3-sec hold BIL Manual Therapy: N/A Neuromuscular re-ed: N/A Therapeutic Activity: N/A Modalities: N/A Self Care: N/A     PATIENT EDUCATION:  Education details: Pt educated on probable underlying pathophysiology, POC, prognosis, FOTO, and HEP Person educated: Patient Education method: EConsulting civil engineer Demonstration, and Handouts Education comprehension: verbalized understanding and returned demonstration     HOME EXERCISE PROGRAM: Access Code: [email or web address]: [email or web address] Date: 05/16/2022 Prepared by: TVanessa [place]  Exercises - Butterfly Groin Stretch  - 1 x daily - 7 x weekly -  2-min hold Education officer, environmental with Table and Chair Support  - 1 x daily - 7 x weekly - 2-min hold - Standing ITB Stretch  - 1 x daily - 7 x weekly - 2-min hold - Mini Squat with Counter Support  - 1 x daily - 7 x weekly - 3 sets - 12 reps - Sidelying Hip Abduction  - 1 x daily - 7 x weekly - 2 sets - 10 reps - 5-sec hold   ASSESSMENT:   CLINICAL IMPRESSION: Pt responded well to all interventions today, demonstrating good form and no increase in pain with selected exercises. She reports moderate difficulty with eccentric hip exercises, but reports she can tell they are challenging her muscles. She will continue to benefit from skilled PT to address her primary impairments and return to her prior level of function with less limitation.     OBJECTIVE IMPAIRMENTS Abnormal gait, decreased balance, decreased endurance, decreased mobility, difficulty walking, decreased ROM, decreased strength, hypomobility, impaired flexibility, improper body mechanics, postural dysfunction, and pain.    ACTIVITY LIMITATIONS bending, standing, squatting, stairs,  and locomotion level   PARTICIPATION LIMITATIONS: laundry, shopping, community activity, and yard work   PERSONAL FACTORS 3+ comorbidities: See medical hx  are also affecting patient's functional outcome.        GOALS: Goals reviewed with patient? Yes   SHORT TERM GOALS: Target date: 06/13/2022  Pt will report understanding and adherence to initial HEP in order to promote independence in the management of primary impairments. Baseline: HEP provided at eval Goal status: INITIAL     LONG TERM GOALS: Target date: 07/11/2022    Pt will achieve a FOTO score of 73% in order to demonstrate improved functional ability as it relates to her primary impairments.  Baseline: 61% Goal status: INITIAL   2.  Pt will report ability to walk >15 minutes with 0/10 pain in order to return to walking track with less limitation. Baseline: >4/10 pain with 5 minutes of walking Goal status: INITIAL   3.  Pt will achieve global BIL hip and knee strength of 4+/5 or greater in order to perform ADLs such as getting down on the floor to clean under her couch with less limitation. Baseline: See MMT chart Goal status: INITIAL   4.  Pt will achieve a 5xSTS in 12 seconds or less in order to demonstrate safe transfers for community activity.  Baseline: 15 seconds Goal status: INITIAL         PLAN: PT FREQUENCY: 1x/week   PT DURATION: 8 weeks   PLANNED INTERVENTIONS: Therapeutic exercises, Therapeutic activity, Neuromuscular re-education, Balance training, Gait training, Patient/Family education, Self Care, Joint mobilization, Stair training, Orthotic/Fit training, Aquatic Therapy, Dry Needling, Electrical stimulation, Spinal mobilization, Cryotherapy, Moist heat, Taping, Biofeedback, Ionotophoresis '4mg'$ /ml Dexamethasone, Manual therapy, and Re-evaluation   PLAN FOR NEXT SESSION: Progress quad/ adductor eccentrics, global LE stretching    Vanessa [place], PT, DPT 05/23/22 2:41 PM

## 2022-05-23 NOTE — Chronic Care Management (AMB) (Signed)
Chronic Care Management Pharmacy Assistant   Name: [name] [name]  MRN: [id number] DOB: 1951/10/02  Reason for Encounter: Disease State / Hypertension Assessment Call   Conditions to be addressed/monitored: HTN  Recent office visits:  03/28/2022 [name] Penna MD - Patient was seen for right thigh pain. Referral to Sport Medicine. No medication changes. No follow up noted.   Recent consult visits:  05/04/2022 [name] Mac DO (sport med) - Patient was seen for Strain of right quadriceps, initial encounter. Referral to Physical Therapy. No medication changes. Follow up in 4 weeks.   04/17/2022 [name] Ellison PA-C (heartcare) - Patient was seen for PAF (paroxysmal atrial fibrillation) and additional issues. Discontinued Calcium + D3. Follow up in 6 months.   04/12/2022 [name] Breeding MD (cardiology) - Patient was seen for Nonrheumatic aortic valve insufficiency and additional issues. Started Pravastatin 20 mg. Follow up in 6 months.   04/06/2022 [name] Mac DO (sport med) - Patient was seen for  Right hip pain and an additional issue. No medication changes. Follow up in 4 weeks.   Hospital visits:  None  Medications: Outpatient Encounter Medications as of 05/23/2022  Medication Sig   acetaminophen (TYLENOL) 500 MG tablet Take 1,000 mg by mouth every 6 (six) hours as needed for moderate pain.   alendronate (FOSAMAX) 70 MG tablet TAKE 1 TABLET BY MOUTH  WEEKLY WITH A FULL GLASS OF WATER ON AN EMPTY STOMACH   apixaban (ELIQUIS) 5 MG TABS tablet TAKE 1 TABLET BY MOUTH  TWICE DAILY   Calcium Citrate-Vitamin D (CITRACAL + D PO) Take by mouth. 1 per day   Cholecalciferol (VITAMIN D3) 50 MCG (2000 UT) TABS Take by mouth. 1 per day   diltiazem (CARDIZEM) 30 MG tablet TAKE 1 TABLET EVERY 4 HOURS AS NEEDED FOR BREAKTHROUGH AFIB   diltiazem (TIAZAC) 300 MG 24 hr capsule TAKE 1 CAPSULE BY MOUTH  DAILY   dofetilide (TIKOSYN) 250 MCG capsule Take 1 capsule (250 mcg total) by mouth 2 (two)  times daily.   LORazepam (ATIVAN) 0.5 MG tablet Take 0.5 mg by mouth daily as needed for anxiety (when flying).   metoprolol tartrate (LOPRESSOR) 25 MG tablet TAKE 1 TABLET BY MOUTH TWICE  DAILY   Multiple Vitamins-Minerals (PRESERVISION AREDS PO) Take 1 capsule by mouth in the morning and at bedtime.   potassium chloride (KLOR-CON) 10 MEQ tablet Take 2 tablets (20 mEq total) by mouth daily.   pravastatin (PRAVACHOL) 20 MG tablet Take 1 tablet (20 mg total) by mouth every evening.   No facility-administered encounter medications on file as of 05/23/2022.  Fill History: PRAVASTATIN SODIUM  20 MG TABS 04/12/2022 90   POTASSIUM CHLORIDE ER  10 MEQ TBCR 03/06/2022 90   METOPROLOL TARTRATE  25 MG TABS 04/21/2022 90   DOFETILIDE 250 MCG CAPSULE 04/07/2022 90   DILTIAZEM HYDROCHLORIDE ER  300 MG CP24 05/04/2022 90   DILTIAZEM 30 MG TABLET 05/12/2022 30   ELIQUIS  5 MG TABS 03/25/2022 90    ALENDRONATE SODIUM  70 MG TABS 03/25/2022 84   Reviewed chart prior to disease state call. Spoke with patient regarding BP  Recent Office Vitals: BP Readings from Last 3 Encounters:  04/17/22 122/66  04/12/22 (!) 118/58  04/06/22 100/70   Pulse Readings from Last 3 Encounters:  05/04/22 62  04/17/22 67  04/12/22 61    Wt Readings from Last 3 Encounters:  05/04/22 92 lb (41.7 kg)  04/17/22 92 lb 9.6 oz (42 kg)  04/12/22 92  lb 9.6 oz (42 kg)     Kidney Function Lab Results  Component Value Date/Time   CREATININE 0.70 04/17/2022 09:44 AM   CREATININE 0.68 01/11/2022 10:45 AM   CREATININE 0.82 09/28/2020 08:22 AM   CREATININE 0.75 11/20/2019 08:06 AM   GFR 88.98 01/11/2022 10:45 AM   GFRNONAA >60 06/24/2021 10:54 AM   GFRNONAA 74 09/28/2020 08:22 AM   GFRAA 86 09/28/2020 08:22 AM       Latest Ref Rng & Units 04/17/2022    9:44 AM 01/11/2022   10:45 AM 10/18/2021   10:10 AM  BMP  Glucose 70 - 99 mg/dL 70  86  94   BUN 8 - 27 mg/dL '19  22  18   '$ Creatinine 0.57 - 1.00 mg/dL 0.70   0.68  0.69   BUN/Creat Ratio 12 - '28 27   26   '$ Sodium 134 - 144 mmol/L 141  137  142   Potassium 3.5 - 5.2 mmol/L 4.4  4.4  4.6   Chloride 96 - 106 mmol/L 100  101  102   CO2 20 - 29 mmol/L '28  31  27   '$ Calcium 8.7 - 10.3 mg/dL 10.2  10.5  9.4     Current antihypertensive regimen:  Cardizem 30 mg take 1 tablet as needed for breakthrough Afib Tiazac 300 mg daily Metoprolol 25 mg twice daily  How often are you checking your Blood Pressure? Patient is checking blood pressures 1-2 per month, she mentions she has been to a doctors office weekly for a few weeks and it has been checked with those visits.   Current home BP readings: Patient states her home readings are always around 120/70.   What recent interventions/DTPs have been made by any provider to improve Blood Pressure control since last CPP Visit: No recent interventions  Any recent hospitalizations or ED visits since last visit with CPP? No recent hospital visits  What diet changes have been made to improve Blood Pressure Control?  Patient follows a plant based diet with fish and chicken, low salt and sugar Breakfast - patient will have a bran cereal with almond milk or gluten free pancakes Lunch - patient will have a bagel with cream cheese or salad Dinner - patient will have sometimes chicken or fish with a salad or vegetable Snack - mandrin oranges  What exercise is being done to improve your Blood Pressure Control?  Patient walks daily and physical therapy exercises daily  Notes: Patient mentions her Afib episodes are becoming shorter and less often and the cardizem does help settle the Afib episodes.   Adherence Review: Is the patient currently on ACE/ARB medication? No Does the patient have >5 day gap between last estimated fill dates? No  Care Gaps: AWV - schedule 12/06/2022 Last BP - 122/66 on 04/17/2022  Star Rating Drugs: Pravastatin 20 mg - last filled 04/12/2022 90 DS at [facility] Pharmacist Assistant [phone]

## 2022-05-25 ENCOUNTER — Telehealth: Payer: Self-pay

## 2022-05-25 ENCOUNTER — Encounter: Payer: Self-pay | Admitting: Emergency Medicine

## 2022-05-25 NOTE — Telephone Encounter (Signed)
Patient called and left message.  She would like to talk to pharmacist regarding marks on her legs and medication suggestions.  She asked for Avalon.

## 2022-05-26 ENCOUNTER — Other Ambulatory Visit: Payer: Self-pay | Admitting: Internal Medicine

## 2022-05-26 DIAGNOSIS — I480 Paroxysmal atrial fibrillation: Secondary | ICD-10-CM

## 2022-05-26 NOTE — Telephone Encounter (Signed)
If the PPI was beneficial then I think she should get back on the omeprazole - would start it at BID for 2 weeks, see if she can tolerate going to qd after that. No real reason to restart the loratadine.

## 2022-05-26 NOTE — Telephone Encounter (Signed)
Patient noted multiple mosquito bites on her legs, no longer itching but still red/swollen.  Wanted to know what was safe to use.  Assured her that OTC hydrocortisone cream is a good choice and won't interfere with her medications.

## 2022-05-26 NOTE — Telephone Encounter (Signed)
Dr. Lamonte Sakai please advise on the following My Chart message:  \ [facility] Pool (supporting Collene Gobble, MD) 20 hours ago (2:37 PM)    This follows up on Dr. Agustina Caroli recommendation that I try Omeprazole and Loratadine to try to get rid of my persistent cough. I tried both, and I still have the cough, although it is not as severe. I tried both for two weeks, then discontinued the Omeprazole and continued for two more weeks with the Loratadine. The Omeprazole seemed to improve the situation, but I still have the cough. The Loratadine did not improve the coughing. I still have the cough, although not as severe, and now my husband has a severe cough. He will see an NP at his [facility] practice this afternoon. My husband had resisted my cough for months, and now he has one. What should I do from here? Thank you for your guidance and expertise. [name] [name], [phone]  Thank you

## 2022-05-26 NOTE — Telephone Encounter (Signed)
Eliquis '5mg'$  refill request received. Patient is 71 years old, weight-41.7kg, Crea-0.70 on 04/17/2022, Diagnosis-Afib, and last seen by Oda Kilts on 04/17/2022. Dose is appropriate based on dosing criteria. Will send in refill to requested pharmacy.

## 2022-05-30 ENCOUNTER — Ambulatory Visit: Payer: Medicare Other | Attending: Sports Medicine

## 2022-05-30 DIAGNOSIS — M6281 Muscle weakness (generalized): Secondary | ICD-10-CM | POA: Diagnosis not present

## 2022-05-30 DIAGNOSIS — M79604 Pain in right leg: Secondary | ICD-10-CM | POA: Diagnosis not present

## 2022-05-30 DIAGNOSIS — R262 Difficulty in walking, not elsewhere classified: Secondary | ICD-10-CM | POA: Insufficient documentation

## 2022-05-30 NOTE — Therapy (Signed)
OUTPATIENT PHYSICAL THERAPY TREATMENT NOTE   Patient Name: [name] [name] MRN: [id number] DOB:March 31, 1951, 71 y.o., female Today's Date: 05/30/2022  PCP: Burnis Medin, MD REFERRING PROVIDER: Glennon Mac, DO  END OF SESSION:   PT End of Session - 05/30/22 1353     Visit Number 3    Number of Visits 9    Date for PT Re-Evaluation 07/18/22    Authorization Type UHC MCR    Authorization Time Period FOTO v6, v10, kx mod v15    PT Start Time 6063    PT Stop Time 1438    PT Time Calculation (min) 43 min    Activity Tolerance Patient tolerated treatment well    Behavior During Therapy [facility] for tasks assessed/performed              Past Medical History:  Diagnosis Date   Allergic rhinitis    Aortic insufficiency    CAT SCRATCH 09/10/2009   Qualifier: Diagnosis of  By: Regis Bill MD, [name]    Clavicle fracture 12/2006   left   Diverticulosis    HTN (hypertension)    Osteopenia    Paroxysmal atrial fibrillation (HCC)    Pneumonia    PONV (postoperative nausea and vomiting)    Vision disturbance 03/13/2013   Negative neuro MRA MRI   Past Surgical History:  Procedure Laterality Date   ABDOMINAL HYSTERECTOMY     pt denies 04/15/14   APPENDECTOMY     ATRIAL FIBRILLATION ABLATION N/A 02/01/2021   Procedure: ATRIAL FIBRILLATION ABLATION;  Surgeon: Thompson Grayer, MD;  Location: [facility];  Service: Cardiovascular;  Laterality: N/A;   BRONCHIAL WASHINGS  09/05/2021   Procedure: BRONCHIAL WASHINGS;  Surgeon: Collene Gobble, MD;  Location: [facility];  Service: Cardiopulmonary;;   LAPAROSCOPIC SALPINGO OOPHERECTOMY Right    TONSILLECTOMY AND ADENOIDECTOMY     VIDEO BRONCHOSCOPY N/A 09/05/2021   Procedure: VIDEO BRONCHOSCOPY WITHOUT FLUORO;  Surgeon: Collene Gobble, MD;  Location: [facility];  Service: Cardiopulmonary;  Laterality: N/A;   Patient Active Problem List   Diagnosis Date Noted   Acquired dilation of ascending aorta and aortic root ([place]) 04/11/2022    Dyslipidemia 04/11/2022   Elevated coronary artery calcium score 04/11/2022   Enlarged aorta ([place]) 04/11/2022   URI (upper respiratory infection) 11/01/2021   Intermediate stage nonexudative age-related macular degeneration of both eyes 09/06/2021   Bronchiectasis without complication ([place]) 01/60/1093   Cough 08/09/2021   Pneumonia due to COVID-19 virus 07/05/2021   Secondary hypercoagulable state ([place]) 05/31/2021   Paroxysmal A-fib ([place]) 05/31/2021   Posterior capsular opacification non visually significant of left eye 07/19/2020   Left epiretinal membrane 07/19/2020   Right epiretinal membrane 07/19/2020   Low back pain 07/14/2020   Pain in left leg 07/14/2020   Educated about COVID-19 virus infection 05/30/2020   Hypercholesterolemia 03/11/2020   Atrial fibrillation ([place]) 03/11/2020   Sensorineural hearing loss (SNHL), bilateral 03/21/2018   Tinnitus of both ears 03/21/2018   Hypertensive disorder 06/30/2017   Osteoporosis 10/19/2016   Paroxysmal atrial fibrillation ([place]) 09/27/2015   Special screening for malignant neoplasms, colon 04/16/2014   Hemorrhoids, unspecified hemorrhoid type 04/16/2014   Foot callus 05/14/2013   Bilateral bunions 03/13/2013   Hypertrophic toenail 03/13/2013   Vision disturbance 03/13/2013   Post-nasal drainage 05/05/2012   Aortic insufficiency 01/16/2012   Macular degeneration 10/12/2011   ANXIETY, SITUATIONAL 05/08/2008   Vitamin D insufficiency 05/22/2007   ELEVATED BLOOD PRESSURE WITHOUT DIAGNOSIS OF HYPERTENSION 05/22/2007  Allergic rhinitis due to allergen 03/28/2007    REFERRING DIAG: O87.867E (ICD-10-CM) - Strain of right quadriceps, initial encounter  THERAPY DIAG:  Pain in right leg  Muscle weakness (generalized)  Difficulty in walking, not elsewhere classified  Rationale for Evaluation and Treatment Rehabilitation  PERTINENT HISTORY: Osteopenia (borderline osteoporosis), aortic insufficiency, A fib, HTN  SUBJECTIVE: Pt  reports HEP adherence. Pt denies any pain currently.   PAIN:  Are you having pain? Yes: NPRS scale: 0/10 Pain location: Rt anterior thigh Pain description: Tight Aggravating factors: walking > 5 minutes, prolonged standing > 5 minutes Relieving factors: acetaminophen (used sparingly), massage   OBJECTIVE: (objective measures completed at initial evaluation unless otherwise dated)   DIAGNOSTIC FINDINGS: N/A   PATIENT SURVEYS:  FOTO 61%, predicted 73% in 12 visits   COGNITION:           Overall cognitive status: Within functional limits for tasks assessed                          SENSATION: Not tested     MUSCLE LENGTH: Hamstring 90/90 test: 50 degrees shy of full extension BIL Ely's test: Severely limited and painful BIL Ober's test: Severe limitation on Rt   POSTURE: weight shift left   PALPATION: TTP throughout Rt IT band, adductors   LOWER EXTREMITY ROM:   A/PROM Right eval Left eval  Hip abduction 40d, tightness    Hip adduction      Knee flexion WNL WNL  Knee extension WNL WNL   (Blank rows = not tested)   LOWER EXTREMITY MMT:   MMT Right eval Left eval  Hip flexion 3+/5 3+/5  Hip extension 3/5 3/5  Hip abduction 3/5 3/5  Hip adduction 3/5 3/5  Hip internal rotation 4/5 4/5  Hip external rotation 4/5 4/5  Knee flexion 4/5 4/5  Knee extension 4/5 4/5   (Blank rows = not tested)     FUNCTIONAL TESTS:  5xSTS: 15 seconds Squat: WNL SLS: WNL BIL, (+) Trendelenburg Lunge: Increased tension through Rt quad with Rt leg back   GAIT: Distance walked: 20 ft Assistive device utilized: None Level of assistance: Complete Independence Comments: Decreased gait speed, mild Rt antalgic gait       TODAY'S TREATMENT:  [facility] Adult PT Treatment:                                                DATE: 05/30/2022 Therapeutic Exercise: Modified pigeon stretch on edge of table x35mn BIL Seated butterfly stretch x276m [place] squat with UE support 2x10  BIL Standing Rt hip adduction with slow eccentric return with 3# cable to ankle at FrComcastachine 2x10  Bridge with slow, eccentric heel slides 3x8 Side knee plank 2x10 BIL Manual Therapy: N/A Neuromuscular re-ed: N/A Therapeutic Activity: N/A Modalities: N/A Self Care: N/A   [facility] Adult PT Treatment:                                                DATE: 05/23/2022 Therapeutic Exercise: Standing IT band stretch x2m54mon Rt Standing Rt hip adduction with slow eccentric return with 3# cable to ankle at Free Motion machine 2x10  Standing Rt  hip flexion with slow eccentric return with 3# cable to ankle at Free Motion machine 2x10  Bridge with YTB clam 2x15 Romanian deadlift with 15# kettlebell 3x8 SL balance clocks 2x5 cycles BIL Side lunge with 5# kettlebell 2x10 with 3-sec hold BIL Manual Therapy: N/A Neuromuscular re-ed: N/A Therapeutic Activity: N/A Modalities: N/A Self Care: N/A     PATIENT EDUCATION:  Education details: Pt educated on probable underlying pathophysiology, POC, prognosis, FOTO, and HEP Person educated: Patient Education method: Consulting civil engineer, Demonstration, and Handouts Education comprehension: verbalized understanding and returned demonstration     HOME EXERCISE PROGRAM: Access Code: [email or web address] URL: [email or web address] Date: 05/16/2022 Prepared by: Vanessa [place]   Exercises - Butterfly Groin Stretch  - 1 x daily - 7 x weekly - 2-min hold - Standing Quad Stretch with Table and Chair Support  - 1 x daily - 7 x weekly - 2-min hold - Standing ITB Stretch  - 1 x daily - 7 x weekly - 2-min hold - Mini Squat with Counter Support  - 1 x daily - 7 x weekly - 3 sets - 12 reps - Sidelying Hip Abduction  - 1 x daily - 7 x weekly - 2 sets - 10 reps - 5-sec hold   ASSESSMENT:   CLINICAL IMPRESSION: Pt responded well to all progressed exercises today, demonstrating good form and no increase in pain with performed exercises, although she  reports moderate fatigue at the end of the session. She will continue to benefit from skilled PT to address her primary impairments and return to her prior level of function with less limitation.     OBJECTIVE IMPAIRMENTS Abnormal gait, decreased balance, decreased endurance, decreased mobility, difficulty walking, decreased ROM, decreased strength, hypomobility, impaired flexibility, improper body mechanics, postural dysfunction, and pain.    ACTIVITY LIMITATIONS bending, standing, squatting, stairs, and locomotion level   PARTICIPATION LIMITATIONS: laundry, shopping, community activity, and yard work   PERSONAL FACTORS 3+ comorbidities: See medical hx  are also affecting patient's functional outcome.        GOALS: Goals reviewed with patient? Yes   SHORT TERM GOALS: Target date: 06/13/2022  Pt will report understanding and adherence to initial HEP in order to promote independence in the management of primary impairments. Baseline: HEP provided at eval Goal status: INITIAL     LONG TERM GOALS: Target date: 07/11/2022    Pt will achieve a FOTO score of 73% in order to demonstrate improved functional ability as it relates to her primary impairments.  Baseline: 61% Goal status: INITIAL   2.  Pt will report ability to walk >15 minutes with 0/10 pain in order to return to walking track with less limitation. Baseline: >4/10 pain with 5 minutes of walking Goal status: INITIAL   3.  Pt will achieve global BIL hip and knee strength of 4+/5 or greater in order to perform ADLs such as getting down on the floor to clean under her couch with less limitation. Baseline: See MMT chart Goal status: INITIAL   4.  Pt will achieve a 5xSTS in 12 seconds or less in order to demonstrate safe transfers for community activity.  Baseline: 15 seconds Goal status: INITIAL         PLAN: PT FREQUENCY: 1x/week   PT DURATION: 8 weeks   PLANNED INTERVENTIONS: Therapeutic exercises, Therapeutic  activity, Neuromuscular re-education, Balance training, Gait training, Patient/Family education, Self Care, Joint mobilization, Stair training, Orthotic/Fit training, Aquatic Therapy, Dry Needling, Electrical stimulation, Spinal mobilization, Cryotherapy, Moist heat, Taping, Biofeedback, Ionotophoresis  $'4mg'P$ /ml Dexamethasone, Manual therapy, and Re-evaluation   PLAN FOR NEXT SESSION: Progress quad/ adductor eccentrics, global LE stretching    Vanessa [place], PT, DPT 05/30/22 2:40 PM

## 2022-06-05 ENCOUNTER — Ambulatory Visit: Payer: Medicare Other | Admitting: Sports Medicine

## 2022-06-05 NOTE — Progress Notes (Signed)
Benito Mccreedy D.[place] Phone: [phone]   Assessment and Plan:     1. Strain of right quadriceps, initial encounter 2. Right hip pain  -Chronic with exacerbation, improving, subsequent visit - Patient's right hip discomfort consistent with lateral quadricep strain continues to improve with physical therapy and as needed Tylenol - Continue and complete physical therapy and then transition to HEP - Continue Tylenol as needed for day-to-day pain relief - Do not recommend use of NSAIDs due to history of heart disease and chronic Eliquis use for atrial fibrillation  Pertinent previous records reviewed include none   Follow Up: As needed   Subjective:   I, Pincus Badder, am serving as a Education administrator for Doctor Glennon Mac   Chief Complaint: right thigh pain    HPI:  04/06/2022 Patient is a 71 year old female complaining of right thigh pain. Patient states that for about 7 weeks to 2 months pain on the right from the groin to above the knee it makes her slower to walk, feels like she is limping, no MOI, no numbness or tingling, has not been taking meds , no creams, feels like she has to tell her leg to go not a severe problem just annoying     05/04/2022 Patient states thigh pain is still there but it is slightly less, did change shoes has ben able to do more walking but still not enough for her and she is slower , has pain in her groin from time to time , better but not healed all the way    06/12/2022 Patient states had been going to PT , has afib, Monday into Tuesday last week and cancelled her PT appointment , has an appointment tomorrow , is taking another dose of meds , last Tuesday she noticed a rash, dx of shingles and or contact dermatitis from PCP , feels like she is getting better over all    Relevant Historical Information: Hypertension, paroxysmal atrial fibrillation, chronically anticoagulated  with Eliquis   Additional pertinent review of systems negative.   Current Outpatient Medications:    acetaminophen (TYLENOL) 500 MG tablet, Take 1,000 mg by mouth every 6 (six) hours as needed for moderate pain., Disp: , Rfl:    alendronate (FOSAMAX) 70 MG tablet, TAKE 1 TABLET BY MOUTH  WEEKLY WITH A FULL GLASS OF WATER ON AN EMPTY STOMACH, Disp: 12 tablet, Rfl: 3   apixaban (ELIQUIS) 5 MG TABS tablet, TAKE 1 TABLET BY MOUTH TWICE  DAILY, Disp: 180 tablet, Rfl: 2   Calcium Citrate-Vitamin D (CITRACAL + D PO), Take by mouth. 1 per day, Disp: , Rfl:    Cholecalciferol (VITAMIN D3) 50 MCG (2000 UT) TABS, Take by mouth. 1 per day, Disp: , Rfl:    diltiazem (CARDIZEM) 30 MG tablet, TAKE 1 TABLET EVERY 4 HOURS AS NEEDED FOR BREAKTHROUGH AFIB, Disp: 45 tablet, Rfl: 1   diltiazem (TIAZAC) 300 MG 24 hr capsule, TAKE 1 CAPSULE BY MOUTH  DAILY, Disp: 90 capsule, Rfl: 3   dofetilide (TIKOSYN) 250 MCG capsule, Take 1 capsule (250 mcg total) by mouth 2 (two) times daily., Disp: 180 capsule, Rfl: 3   LORazepam (ATIVAN) 0.5 MG tablet, Take 0.5 mg by mouth daily as needed for anxiety (when flying)., Disp: , Rfl:    metoprolol tartrate (LOPRESSOR) 25 MG tablet, Take 1 tablet in the AM and 2 tablets in the PM, Disp: 180 tablet, Rfl: 3   Multiple  Vitamins-Minerals (PRESERVISION AREDS PO), Take 1 capsule by mouth in the morning and at bedtime., Disp: , Rfl:    potassium chloride (KLOR-CON) 10 MEQ tablet, Take 2 tablets (20 mEq total) by mouth daily., Disp: 180 tablet, Rfl: 3   pravastatin (PRAVACHOL) 20 MG tablet, Take 1 tablet (20 mg total) by mouth every evening., Disp: 90 tablet, Rfl: 3   Objective:     Vitals:   06/12/22 1248  BP: 90/78  Pulse: 68  SpO2: 95%  Weight: 90 lb (40.8 kg)  Height: '5\' 4"'$  (1.626 m)      Body mass index is 15.45 kg/m.    Physical Exam:    General: awake, alert, and oriented no acute distress, nontoxic Skin: no suspicious lesion.  Light and broad erythematous rash over  medial right thigh with similar but smaller appearing rash over medial left thigh.  No warmth, open lesion, discharge. Neuro:sensation intact distally with no dificits, normal muscle tone, no atrophy, strength 5/5 in all tested lower ext groups Psych: normal mood and affect, speech clear   Right hip: No deformity, swelling.  Generalized decrease musculature bilaterally ROM Flexion 90, ext 30, IR 45, ER 45 TTP trace lateral proximal and distal quadricep, medial proximal quadricep NTTP over the hip flexors, greater troch, glute musculature, si joint, lumbar spine Negative log roll with FROM Negative FABER Negative FADIR Negative Piriformis test Negative fulcrum test Gait normal      Electronically signed by:  Benito Mccreedy D.[facility] 1:21 PM 06/12/22

## 2022-06-06 ENCOUNTER — Ambulatory Visit: Payer: Medicare Other

## 2022-06-06 ENCOUNTER — Telehealth: Payer: Self-pay

## 2022-06-06 NOTE — Telephone Encounter (Signed)
Caller states she has a rash on her legs. Caller states she has also been in afib since last night. *appt scheduled for tomorrow. Started both legs up to 3 weeks ago. Now upper leg, one leg, right leg. Using cortisone cream. No fever.  See PCP within 24hrs per Alvis Lemmings, RN Marcie Bal  Patient has appt with Dr Sarajane Jews on 06/07/22 at 0830

## 2022-06-07 ENCOUNTER — Ambulatory Visit: Payer: Medicare Other | Admitting: Family Medicine

## 2022-06-07 ENCOUNTER — Encounter: Payer: Self-pay | Admitting: Family Medicine

## 2022-06-07 VITALS — BP 118/60 | HR 60 | Temp 98.3°F | Wt 90.1 lb

## 2022-06-07 DIAGNOSIS — R21 Rash and other nonspecific skin eruption: Secondary | ICD-10-CM | POA: Diagnosis not present

## 2022-06-07 NOTE — Progress Notes (Signed)
   Subjective:    Patient ID: [name] [name], female    DOB: [birth year], 71 y.o.   MRN: [id number]  HPI Here for 3 weeks of a rash on the right inner thigh that is slightly itchy. It is never painful. It started as several discrete red bumps that she thought were mosquito bites. Then over time larger areas of redness began to appear. She has been applying OTC cortisone cream with some relief. She notes she had shingles once some years ago on the left trunk. She has received the shingles vaccine.    Review of Systems  Constitutional: Negative.   Respiratory: Negative.    Cardiovascular: Negative.   Skin:  Positive for rash.       Objective:   Physical Exam Constitutional:      General: She is not in acute distress.    Appearance: Normal appearance.  Cardiovascular:     Rate and Rhythm: Normal rate and regular rhythm.     Pulses: Normal pulses.     Heart sounds: Normal heart sounds.  Pulmonary:     Effort: Pulmonary effort is normal.     Breath sounds: Normal breath sounds.  Skin:    Comments: There are several areas of erythematous slightly raised skin on the right inner thigh, these are somewhat urticarial in appearance   Neurological:     Mental Status: She is alert.           Assessment & Plan:  This is most likely a mild episode of shingles. Less likely it could be a contact dermatitis. Since she is having very mild symptoms we agreed to not use an antiviral medication. This should clear up on its own over the next few weeks. Recheck as needed.  Alysia Penna, MD

## 2022-06-09 ENCOUNTER — Telehealth (HOSPITAL_COMMUNITY): Payer: Self-pay | Admitting: *Deleted

## 2022-06-09 MED ORDER — METOPROLOL TARTRATE 25 MG PO TABS: ORAL_TABLET | ORAL | 3 refills | Status: DC

## 2022-06-09 NOTE — Telephone Encounter (Signed)
Patient called in stating since the beginning of the month an increase in afib burden seems to mainly occur at night time. Having mostly at night between 10-12pm. Discussed with Adline Peals PA will increase metoprolol to '25mg'$  in the AM and '50mg'$  in the PM. Pt in agreement and will call next week with response.

## 2022-06-12 ENCOUNTER — Encounter: Payer: Self-pay | Admitting: Cardiology

## 2022-06-12 ENCOUNTER — Ambulatory Visit: Payer: Medicare Other | Admitting: Sports Medicine

## 2022-06-12 VITALS — BP 90/78 | HR 68 | Ht 64.0 in | Wt 90.0 lb

## 2022-06-12 DIAGNOSIS — M25551 Pain in right hip: Secondary | ICD-10-CM

## 2022-06-12 DIAGNOSIS — S76111A Strain of right quadriceps muscle, fascia and tendon, initial encounter: Secondary | ICD-10-CM | POA: Diagnosis not present

## 2022-06-12 NOTE — Patient Instructions (Signed)
Good to see you   

## 2022-06-13 ENCOUNTER — Ambulatory Visit: Payer: Medicare Other

## 2022-06-13 DIAGNOSIS — M6281 Muscle weakness (generalized): Secondary | ICD-10-CM | POA: Diagnosis not present

## 2022-06-13 DIAGNOSIS — M79604 Pain in right leg: Secondary | ICD-10-CM | POA: Diagnosis not present

## 2022-06-13 DIAGNOSIS — R262 Difficulty in walking, not elsewhere classified: Secondary | ICD-10-CM

## 2022-06-13 NOTE — Therapy (Signed)
OUTPATIENT PHYSICAL THERAPY TREATMENT NOTE   Patient Name: [name] [name] MRN: [id number] DOB:1951/08/02, 71 y.o., female Today's Date: 06/13/2022  PCP: Burnis Medin, MD REFERRING PROVIDER: Glennon Mac, DO  END OF SESSION:   PT End of Session - 06/13/22 1348     Visit Number 4    Number of Visits 9    Date for PT Re-Evaluation 07/18/22    Authorization Type UHC MCR    Authorization Time Period FOTO v6, v10, kx mod v15    PT Start Time 7096    PT Stop Time 1435    PT Time Calculation (min) 40 min    Activity Tolerance Patient tolerated treatment well    Behavior During Therapy [facility] for tasks assessed/performed               Past Medical History:  Diagnosis Date   Allergic rhinitis    Aortic insufficiency    CAT SCRATCH 09/10/2009   Qualifier: Diagnosis of  By: Regis Bill MD, [name]    Clavicle fracture 12/2006   left   Diverticulosis    HTN (hypertension)    Osteopenia    Paroxysmal atrial fibrillation (HCC)    Pneumonia    PONV (postoperative nausea and vomiting)    Vision disturbance 03/13/2013   Negative neuro MRA MRI   Past Surgical History:  Procedure Laterality Date   ABDOMINAL HYSTERECTOMY     pt denies 04/15/14   APPENDECTOMY     ATRIAL FIBRILLATION ABLATION N/A 02/01/2021   Procedure: ATRIAL FIBRILLATION ABLATION;  Surgeon: Thompson Grayer, MD;  Location: [place] [facility];  Service: Cardiovascular;  Laterality: N/A;   BRONCHIAL WASHINGS  09/05/2021   Procedure: BRONCHIAL WASHINGS;  Surgeon: Collene Gobble, MD;  Location: [facility];  Service: Cardiopulmonary;;   LAPAROSCOPIC SALPINGO OOPHERECTOMY Right    TONSILLECTOMY AND ADENOIDECTOMY     VIDEO BRONCHOSCOPY N/A 09/05/2021   Procedure: VIDEO BRONCHOSCOPY WITHOUT FLUORO;  Surgeon: Collene Gobble, MD;  Location: [facility];  Service: Cardiopulmonary;  Laterality: N/A;   Patient Active Problem List   Diagnosis Date Noted   Acquired dilation of ascending aorta and aortic root ([place]) 04/11/2022    Dyslipidemia 04/11/2022   Elevated coronary artery calcium score 04/11/2022   Enlarged aorta ([place]) 04/11/2022   URI (upper respiratory infection) 11/01/2021   Intermediate stage nonexudative age-related macular degeneration of both eyes 09/06/2021   Bronchiectasis without complication ([place]) 28/36/6294   Cough 08/09/2021   Pneumonia due to COVID-19 virus 07/05/2021   Secondary hypercoagulable state ([place]) 05/31/2021   Paroxysmal A-fib ([place]) 05/31/2021   Posterior capsular opacification non visually significant of left eye 07/19/2020   Left epiretinal membrane 07/19/2020   Right epiretinal membrane 07/19/2020   Low back pain 07/14/2020   Pain in left leg 07/14/2020   Educated about COVID-19 virus infection 05/30/2020   Hypercholesterolemia 03/11/2020   Atrial fibrillation ([place]) 03/11/2020   Sensorineural hearing loss (SNHL), bilateral 03/21/2018   Tinnitus of both ears 03/21/2018   Hypertensive disorder 06/30/2017   Osteoporosis 10/19/2016   Paroxysmal atrial fibrillation ([place]) 09/27/2015   Special screening for malignant neoplasms, colon 04/16/2014   Hemorrhoids, unspecified hemorrhoid type 04/16/2014   Foot callus 05/14/2013   Bilateral bunions 03/13/2013   Hypertrophic toenail 03/13/2013   Vision disturbance 03/13/2013   Post-nasal drainage 05/05/2012   Aortic insufficiency 01/16/2012   Macular degeneration 10/12/2011   ANXIETY, SITUATIONAL 05/08/2008   Vitamin D insufficiency 05/22/2007   ELEVATED BLOOD PRESSURE WITHOUT DIAGNOSIS OF HYPERTENSION 05/22/2007  Allergic rhinitis due to allergen 03/28/2007    REFERRING DIAG: J69.678L (ICD-10-CM) - Strain of right quadriceps, initial encounter  THERAPY DIAG:  Pain in right leg  Muscle weakness (generalized)  Difficulty in walking, not elsewhere classified  Rationale for Evaluation and Treatment Rehabilitation  PERTINENT HISTORY: Osteopenia (borderline osteoporosis), aortic insufficiency, A fib, HTN  SUBJECTIVE: Pt  reports increased fatigue today, but denies any pain today. She reports she feels like she has made good progress in PT to date.  PAIN:  Are you having pain? Yes: NPRS scale: 0/10 Pain location: Rt anterior thigh Pain description: Tight Aggravating factors: walking > 5 minutes, prolonged standing > 5 minutes Relieving factors: acetaminophen (used sparingly), massage   OBJECTIVE: (objective measures completed at initial evaluation unless otherwise dated)   DIAGNOSTIC FINDINGS: N/A   PATIENT SURVEYS:  FOTO 61%, predicted 73% in 12 visits   COGNITION:           Overall cognitive status: Within functional limits for tasks assessed                          SENSATION: Not tested     MUSCLE LENGTH: Hamstring 90/90 test: 50 degrees shy of full extension BIL Ely's test: Severely limited and painful BIL Ober's test: Severe limitation on Rt   POSTURE: weight shift left   PALPATION: TTP throughout Rt IT band, adductors   LOWER EXTREMITY ROM:   A/PROM Right eval Left eval  Hip abduction 40d, tightness    Hip adduction      Knee flexion WNL WNL  Knee extension WNL WNL   (Blank rows = not tested)   LOWER EXTREMITY MMT:   MMT Right eval Left eval  Hip flexion 3+/5 3+/5  Hip extension 3/5 3/5  Hip abduction 3/5 3/5  Hip adduction 3/5 3/5  Hip internal rotation 4/5 4/5  Hip external rotation 4/5 4/5  Knee flexion 4/5 4/5  Knee extension 4/5 4/5   (Blank rows = not tested)     FUNCTIONAL TESTS:  5xSTS: 15 seconds Squat: WNL SLS: WNL BIL, (+) Trendelenburg Lunge: Increased tension through Rt quad with Rt leg back   GAIT: Distance walked: 20 ft Assistive device utilized: None Level of assistance: Complete Independence Comments: Decreased gait speed, mild Rt antalgic gait       TODAY'S TREATMENT:  [facility] Adult PT Treatment:                                                DATE: 06/13/2022 Therapeutic Exercise: Sidelying hip adduction with 2# ankle weight 2x10  BIL Seated butterfly stretch x81mn Seated active hamstring stretch x167m BIL Seated hamstring curls with 35# cable 3x8 with 3-sec return Lateral heel tap on 4-inch step 2x10 BIL with UE support Side knee plank with hip abduction 2x10 BIL Side lunge stretch with UE support [id number] Manual Therapy: N/A Neuromuscular re-ed: N/A Therapeutic Activity: N/A Modalities: N/A Self Care: N/A  [facility] Adult PT Treatment:                                                DATE: 05/30/2022 Therapeutic Exercise: Modified pigeon stretch on edge of table [id number] Seated butterfly  stretch x42mn [place] squat with UE support 2x10 BIL Standing Rt hip adduction with slow eccentric return with 3# cable to ankle at FComcastmachine 2x10  Bridge with slow, eccentric heel slides 3x8 Side knee plank 2x10 BIL Manual Therapy: N/A Neuromuscular re-ed: N/A Therapeutic Activity: N/A Modalities: N/A Self Care: N/A   [facility] Adult PT Treatment:                                                DATE: 05/23/2022 Therapeutic Exercise: Standing IT band stretch x257m on Rt Standing Rt hip adduction with slow eccentric return with 3# cable to ankle at Free Motion machine 2x10  Standing Rt hip flexion with slow eccentric return with 3# cable to ankle at Free Motion machine 2x10  Bridge with YTB clam 2x15 Romanian deadlift with 15# kettlebell 3x8 SL balance clocks 2x5 cycles BIL Side lunge with 5# kettlebell 2x10 with 3-sec hold BIL Manual Therapy: N/A Neuromuscular re-ed: N/A Therapeutic Activity: N/A Modalities: N/A Self Care: N/A     PATIENT EDUCATION:  Education details: Pt educated on probable underlying pathophysiology, POC, prognosis, FOTO, and HEP Person educated: Patient Education method: ExConsulting civil engineerDemonstration, and Handouts Education comprehension: verbalized understanding and returned demonstration     HOME EXERCISE PROGRAM: Access Code: [email or web address]:  [email or web address] Date: 05/16/2022 Prepared by: TuVanessa [place] Exercises - Butterfly Groin Stretch  - 1 x daily - 7 x weekly - 2-min hold - Standing Quad Stretch with Table and Chair Support  - 1 x daily - 7 x weekly - 2-min hold - Standing ITB Stretch  - 1 x daily - 7 x weekly - 2-min hold - Mini Squat with Counter Support  - 1 x daily - 7 x weekly - 3 sets - 12 reps - Sidelying Hip Abduction  - 1 x daily - 7 x weekly - 2 sets - 10 reps - 5-sec hold   ASSESSMENT:   CLINICAL IMPRESSION: Pt responded well to all interventions, demonstrating good form and no pain with completed exercises. Due to pt report of increased fatigue to start the session, exercises were regressed today as to not exacerbate symptoms. Pt will continue to benefit from skilled PT to address her primary impairments and return to her prior level of function with less limitation.     OBJECTIVE IMPAIRMENTS Abnormal gait, decreased balance, decreased endurance, decreased mobility, difficulty walking, decreased ROM, decreased strength, hypomobility, impaired flexibility, improper body mechanics, postural dysfunction, and pain.    ACTIVITY LIMITATIONS bending, standing, squatting, stairs, and locomotion level   PARTICIPATION LIMITATIONS: laundry, shopping, community activity, and yard work   PERSONAL FACTORS 3+ comorbidities: See medical hx  are also affecting patient's functional outcome.        GOALS: Goals reviewed with patient? Yes   SHORT TERM GOALS: Target date: 06/13/2022  Pt will report understanding and adherence to initial HEP in order to promote independence in the management of primary impairments. Baseline: HEP provided at eval Goal status: INITIAL     LONG TERM GOALS: Target date: 07/11/2022    Pt will achieve a FOTO score of 73% in order to demonstrate improved functional ability as it relates to her primary impairments.  Baseline: 61% Goal status: INITIAL   2.  Pt will report  ability to walk >15 minutes with 0/10 pain in order to return to walking  track with less limitation. Baseline: >4/10 pain with 5 minutes of walking Goal status: INITIAL   3.  Pt will achieve global BIL hip and knee strength of 4+/5 or greater in order to perform ADLs such as getting down on the floor to clean under her couch with less limitation. Baseline: See MMT chart Goal status: INITIAL   4.  Pt will achieve a 5xSTS in 12 seconds or less in order to demonstrate safe transfers for community activity.  Baseline: 15 seconds Goal status: INITIAL         PLAN: PT FREQUENCY: 1x/week   PT DURATION: 8 weeks   PLANNED INTERVENTIONS: Therapeutic exercises, Therapeutic activity, Neuromuscular re-education, Balance training, Gait training, Patient/Family education, Self Care, Joint mobilization, Stair training, Orthotic/Fit training, Aquatic Therapy, Dry Needling, Electrical stimulation, Spinal mobilization, Cryotherapy, Moist heat, Taping, Biofeedback, Ionotophoresis '4mg'$ /ml Dexamethasone, Manual therapy, and Re-evaluation   PLAN FOR NEXT SESSION: Progress quad/ adductor eccentrics, global LE stretching    Vanessa [place], PT, DPT 06/13/22 2:38 PM

## 2022-06-15 ENCOUNTER — Ambulatory Visit (INDEPENDENT_AMBULATORY_CARE_PROVIDER_SITE_OTHER): Payer: Medicare Other | Admitting: Family Medicine

## 2022-06-15 ENCOUNTER — Encounter: Payer: Self-pay | Admitting: Family Medicine

## 2022-06-15 VITALS — BP 110/62 | HR 64 | Temp 98.1°F | Wt 90.1 lb

## 2022-06-15 DIAGNOSIS — L304 Erythema intertrigo: Secondary | ICD-10-CM

## 2022-06-15 MED ORDER — KETOCONAZOLE 2 % EX CREA: 1.0000 | TOPICAL_CREAM | Freq: Every day | CUTANEOUS | 0 refills | Status: DC

## 2022-06-15 NOTE — Progress Notes (Signed)
Subjective:    Patient ID: [name] [name], female    DOB: 1951/09/15, 71 y.o.   MRN: [id number]  Chief Complaint  Patient presents with   Rash    Started on left thigh moved to right. Red and swollen. Started before seeing Sarajane Jews on 8/9, was told to return if spreads.     HPI Patient was seen today for ongoing concern followed by Dr. Regis Bill.  Pt with rash on R medial thigh x 4 wks or more.  Seen by Dr. Sarajane Jews 06/07/2022 for rash on right thigh, advised possibly mild episode of shingles less likely contact dermatitis.  Patient advised to follow-up for continued symptoms.  Now with same pruritic rash on L medial thigh and continued rash on the right thigh.  Areas are pruritic with mild burning.  Patient tried cortisone cream.  Denies rash on other areas of body.  Endorses recent weight loss.  Wearing shorts, some are fitted.  Denies increased sweating, changes in soaps, lotions, or detergents, contact with irritants.  Does note several mosquito bites that are healing on bilateral legs.  Patient has appointment with Dr. Regis Bill on Monday, 8/21.  Past Medical History:  Diagnosis Date   Allergic rhinitis    Aortic insufficiency    CAT SCRATCH 09/10/2009   Qualifier: Diagnosis of  By: Regis Bill MD, [name]    Clavicle fracture 12/2006   left   Diverticulosis    HTN (hypertension)    Osteopenia    Paroxysmal atrial fibrillation (HCC)    Pneumonia    PONV (postoperative nausea and vomiting)    Vision disturbance 03/13/2013   Negative neuro MRA MRI    Allergies  Allergen Reactions   Latex Hives   Other Rash and Other (See Comments)    Adhesive pads left on skin for extended periods = BURNS AND RASHES ON THE SKIN!!!!   Codeine Phosphate Other (See Comments)    Drowsiness    Demerol [Meperidine] Nausea And Vomiting   Protonix [Pantoprazole] Rash   Sulfa Antibiotics Rash   Tens Therapy Replace Back Pads Itching and Rash    ROS General: Denies fever, chills, night sweats, changes in weight, changes  in appetite HEENT: Denies headaches, ear pain, changes in vision, rhinorrhea, sore throat CV: Denies CP, palpitations, SOB, orthopnea Pulm: Denies SOB, cough, wheezing GI: Denies abdominal pain, nausea, vomiting, diarrhea, constipation GU: Denies dysuria, hematuria, frequency, vaginal discharge Msk: Denies muscle cramps, joint pains Neuro: Denies weakness, numbness, tingling Skin: Denies rashes, bruising + pruritic rash bilateral thighs Psych: Denies depression, anxiety, hallucinations     Objective:    Blood pressure 110/62, pulse 64, temperature 98.1 F (36.7 C), temperature source Oral, weight 90 lb 1.6 oz (40.9 kg), SpO2 96 %.  Gen. Pleasant, well-nourished, in no distress, normal affect   HEENT: [place]/AT, face symmetric, conjunctiva clear, no scleral icterus, PERRLA, EOMI, nares patent without drainage Lungs: no accessory muscle use Cardiovascular: RRR, no peripheral edema Musculoskeletal: No deformities, no cyanosis or clubbing, normal tone Neuro:  A&Ox3, CN II-XII intact, normal gait Skin:  Warm, dry, intact.  Bilateral medial thighs with faintly erythemic slightly raised appearing plaques with satellite lesions surrounding.  Several hyperpigmented, dry appearing areas on bilateral LEs.  No vesicular lesions noted.   Wt Readings from Last 3 Encounters:  06/15/22 90 lb 1.6 oz (40.9 kg)  06/12/22 90 lb (40.8 kg)  06/07/22 90 lb 2 oz (40.9 kg)    Lab Results  Component Value Date   WBC 7.6 01/11/2022  HGB 13.3 01/11/2022   HCT 40.4 01/11/2022   PLT 312.0 01/11/2022   GLUCOSE 70 04/17/2022   CHOL 176 01/11/2022   TRIG 81.0 01/11/2022   HDL 73.40 01/11/2022   LDLDIRECT 131.0 09/03/2009   LDLCALC 87 01/11/2022   ALT 14 01/11/2022   AST 19 01/11/2022   NA 141 04/17/2022   K 4.4 04/17/2022   CL 100 04/17/2022   CREATININE 0.70 04/17/2022   BUN 19 04/17/2022   CO2 28 04/17/2022   TSH 1.17 01/11/2022    Assessment/Plan:  Intertrigo  -Bilateral thighs with faintly  erythematous plaques with satellite lesions. -Advised rash likely 2/2 moisture being trapped against skin causing overgrowth of yeast -Start ketoconazole cream -Advised to wear loosefitting clothing and natural fabrics such as cotton -Given handout - Plan: ketoconazole (NIZORAL) 2 % cream  Keep follow-up on Monday 06/19/2022 with PCP  Grier Mitts, MD

## 2022-06-16 ENCOUNTER — Other Ambulatory Visit (HOSPITAL_COMMUNITY): Payer: Self-pay

## 2022-06-16 MED ORDER — METOPROLOL TARTRATE 25 MG PO TABS: ORAL_TABLET | ORAL | 3 refills | Status: DC

## 2022-06-19 ENCOUNTER — Encounter: Payer: Self-pay | Admitting: Internal Medicine

## 2022-06-19 ENCOUNTER — Ambulatory Visit (INDEPENDENT_AMBULATORY_CARE_PROVIDER_SITE_OTHER): Payer: Medicare Other | Admitting: Internal Medicine

## 2022-06-19 VITALS — BP 92/60 | HR 65 | Temp 98.7°F | Wt 90.2 lb

## 2022-06-19 DIAGNOSIS — Z79899 Other long term (current) drug therapy: Secondary | ICD-10-CM | POA: Diagnosis not present

## 2022-06-19 DIAGNOSIS — R21 Rash and other nonspecific skin eruption: Secondary | ICD-10-CM

## 2022-06-19 MED ORDER — LORAZEPAM 0.5 MG PO TABS: 0.5000 mg | ORAL_TABLET | Freq: Every day | ORAL | 0 refills | Status: DC | PRN

## 2022-06-19 NOTE — Patient Instructions (Signed)
Continue  ketconazole   for   2 weeks and if improving until gone. Avoid   heat and moisture.  If not getting better   then get back with [place] again.  Pictures  tracking can help.

## 2022-06-19 NOTE — Progress Notes (Signed)
Chief Complaint  Patient presents with   Follow-up    On rash on thighs. Reports burn, slight itchy. Last seen with Dr. Volanda Napoleon given ointment. Patient reports the rash is getting better.     HPI: [name] [name] 71 y.o. come in for on going problem with rashs Has seen a number or providers  8/9 shingles vs mild CD  r inner thigh rash  rx antivirals  8 /17 saw dr banks and noted both sides  rx intertrigo   given ketoconazole  had this appt so kept this  some improved maybe  no itching now . To travel to [place] by air has ? About  communicability   progress.  No new rash at this time or systemic sx  Derm did freezing righ scalp temple lesion still crusty and there ? concern  ROS: See pertinent positives and negatives per HPI. A fib  dec on increase metoprolol dosing Past Medical History:  Diagnosis Date   Allergic rhinitis    Aortic insufficiency    CAT SCRATCH 09/10/2009   Qualifier: Diagnosis of  By: Regis Bill MD, [name]    Clavicle fracture 12/2006   left   Diverticulosis    HTN (hypertension)    Osteopenia    Paroxysmal atrial fibrillation (HCC)    Pneumonia    PONV (postoperative nausea and vomiting)    Vision disturbance 03/13/2013   Negative neuro MRA MRI    Family History  Problem Relation Age of Onset   Scleroderma Mother    Hyperlipidemia Mother    Coronary artery disease Father 62   Esophageal cancer Paternal Grandmother     Social History   Socioeconomic History   Marital status: Married    Spouse name: Not on file   Number of children: 0   Years of education: Not on file   Highest education level: Bachelor's degree (e.g., BA, AB, BS)  Occupational History   Occupation: Veterinary surgeon   Occupation: REPORTER    Employer: [employer]  Tobacco Use   Smoking status: Never   Smokeless tobacco: Never  Substance and Sexual Activity   Alcohol use: Not Currently   Drug use: No   Sexual activity: Not on file  Other Topics Concern   Not on file  Social  History Narrative   Married   hh of 2    p0g0   Works for [employer]    Social Determinants of Radio broadcast assistant Strain: [place]  (03/26/2022)   Overall Financial Resource Strain (CARDIA)    Difficulty of Paying Living Expenses: Not hard at all  Food Insecurity: No Food Insecurity (03/26/2022)   Hunger Vital Sign    Worried About Running Out of Food in the Last Year: Never true    [place] in the Last Year: Never true  Transportation Needs: No Transportation Needs (03/26/2022)   PRAPARE - Hydrologist (Medical): No    Lack of Transportation (Non-Medical): No  Physical Activity: Insufficiently Active (03/26/2022)   Exercise Vital Sign    Days of Exercise per Week: 3 days    Minutes of Exercise per Session: 30 min  Stress: No Stress Concern Present (03/26/2022)   [place]    Feeling of Stress : Only a little  Social Connections: Socially Integrated (03/26/2022)   Social Connection and Isolation Panel [NHANES]    Frequency of Communication with Friends and  Family: More than three times a week    Frequency of Social Gatherings with Friends and Family: Twice a week    Attends Religious Services: 1 to 4 times per year    Active Member of [employer] or Organizations: Yes    Attends Music therapist: More than 4 times per year    Marital Status: Married    Outpatient Medications Prior to Visit  Medication Sig Dispense Refill   acetaminophen (TYLENOL) 500 MG tablet Take 1,000 mg by mouth every 6 (six) hours as needed for moderate pain.     alendronate (FOSAMAX) 70 MG tablet TAKE 1 TABLET BY MOUTH  WEEKLY WITH A FULL GLASS OF WATER ON AN EMPTY STOMACH 12 tablet 3   apixaban (ELIQUIS) 5 MG TABS tablet TAKE 1 TABLET BY MOUTH TWICE  DAILY 180 tablet 2   Calcium Citrate-Vitamin D (CITRACAL + D PO) Take by mouth. 1 per day     Cholecalciferol (VITAMIN D3) 50 MCG (2000 UT)  TABS Take by mouth. 1 per day     Coenzyme Q10 (COQ10) 100 MG CAPS      diltiazem (CARDIZEM) 30 MG tablet TAKE 1 TABLET EVERY 4 HOURS AS NEEDED FOR BREAKTHROUGH AFIB 45 tablet 1   diltiazem (TIAZAC) 300 MG 24 hr capsule TAKE 1 CAPSULE BY MOUTH  DAILY 90 capsule 3   dofetilide (TIKOSYN) 250 MCG capsule Take 1 capsule (250 mcg total) by mouth 2 (two) times daily. 180 capsule 3   ketoconazole (NIZORAL) 2 % cream Apply 1 Application topically daily for 7 days. 15 g 0   metoprolol tartrate (LOPRESSOR) 25 MG tablet Take 1 tablet in the AM and 2 tablets in the PM 180 tablet 3   Multiple Vitamins-Minerals (PRESERVISION AREDS PO) Take 1 capsule by mouth in the morning and at bedtime.     potassium chloride (KLOR-CON) 10 MEQ tablet Take 2 tablets (20 mEq total) by mouth daily. 180 tablet 3   pravastatin (PRAVACHOL) 20 MG tablet Take 1 tablet (20 mg total) by mouth every evening. 90 tablet 3   LORazepam (ATIVAN) 0.5 MG tablet Take 0.5 mg by mouth daily as needed for anxiety (when flying).     No facility-administered medications prior to visit.     EXAM:  BP 92/60 (BP Location: Right Arm, Patient Position: Sitting, Cuff Size: Small)   Pulse 65   Temp 98.7 F (37.1 C) (Oral)   Wt 90 lb 3.2 oz (40.9 kg)   SpO2 97%   BMI 15.48 kg/m   Body mass index is 15.48 kg/m.  GENERAL: vitals reviewed and listed above, alert, oriented, appears well hydrated and in no acute distress HEENT: atraumatic, conjunctiva  clear, no obvious abnormalities on inspection of external nose and ear Skin right temple scalp fairly flat crust  Bilateral thighs right fadings left ovoid erythema without center  slight pink at groin area   no satellite lesion on left  no vesicles MS: moves all extremities without noticeable focal  abnormality PSYCH: pleasant and cooperative, no obvious depression or anxiety Lab Results  Component Value Date   WBC 7.6 01/11/2022   HGB 13.3 01/11/2022   HCT 40.4 01/11/2022   PLT 312.0  01/11/2022   GLUCOSE 70 04/17/2022   CHOL 176 01/11/2022   TRIG 81.0 01/11/2022   HDL 73.40 01/11/2022   LDLDIRECT 131.0 09/03/2009   LDLCALC 87 01/11/2022   ALT 14 01/11/2022   AST 19 01/11/2022   NA 141 04/17/2022   K 4.4 04/17/2022  CL 100 04/17/2022   CREATININE 0.70 04/17/2022   BUN 19 04/17/2022   CO2 28 04/17/2022   TSH 1.17 01/11/2022   BP Readings from Last 3 Encounters:  06/19/22 92/60  06/15/22 110/62  06/12/22 90/78    ASSESSMENT AND PLAN:  Discussed the following assessment and plan:  Rash  Medication management Agree with dx of possible  fungal  skin infection     Expectant management. Take 2 weeks of  cream  if not fading then or worse plan fu  . Don't think serious condition.   Can see me or Dr Volanda Napoleon  in my absence  if needed if  persistent or progressive   Refill lorazepam for travel  has 2 pills lleft from 2019  -Patient advised to return or notify health care team  if  new concerns arise. Disc getting rsv vaccine this fall . Uncertain timing  6 mos effective . In addition to flu .  Patient Instructions  Continue  ketconazole   for   2 weeks and if improving until gone. Avoid   heat and moisture.  If not getting better   then get back with [place] again.  Pictures  tracking can help.      [name]. Ciera Beckum M.D.

## 2022-06-24 ENCOUNTER — Ambulatory Visit: Payer: Medicare Other

## 2022-06-24 DIAGNOSIS — M79604 Pain in right leg: Secondary | ICD-10-CM | POA: Diagnosis not present

## 2022-06-24 DIAGNOSIS — R262 Difficulty in walking, not elsewhere classified: Secondary | ICD-10-CM

## 2022-06-24 DIAGNOSIS — M6281 Muscle weakness (generalized): Secondary | ICD-10-CM

## 2022-06-24 NOTE — Therapy (Signed)
OUTPATIENT PHYSICAL THERAPY TREATMENT NOTE   Patient Name: [name] [name] MRN: [id number] DOB:Jan 01, 1951, 71 y.o., female Today's Date: 06/24/2022  PCP: Burnis Medin, MD REFERRING PROVIDER: Glennon Mac, DO  END OF SESSION:   PT End of Session - 06/24/22 0950     Visit Number 5    Number of Visits 9    Date for PT Re-Evaluation 07/18/22    Authorization Type UHC MCR    Authorization Time Period FOTO v6, v10, kx mod v15    PT Start Time 9381    PT Stop Time 1029    PT Time Calculation (min) 40 min    Activity Tolerance Patient tolerated treatment well    Behavior During Therapy [facility] for tasks assessed/performed                Past Medical History:  Diagnosis Date   Allergic rhinitis    Aortic insufficiency    CAT SCRATCH 09/10/2009   Qualifier: Diagnosis of  By: Regis Bill MD, [name]    Clavicle fracture 12/2006   left   Diverticulosis    HTN (hypertension)    Osteopenia    Paroxysmal atrial fibrillation (HCC)    Pneumonia    PONV (postoperative nausea and vomiting)    Vision disturbance 03/13/2013   Negative neuro MRA MRI   Past Surgical History:  Procedure Laterality Date   ABDOMINAL HYSTERECTOMY     pt denies 04/15/14   APPENDECTOMY     ATRIAL FIBRILLATION ABLATION N/A 02/01/2021   Procedure: ATRIAL FIBRILLATION ABLATION;  Surgeon: Thompson Grayer, MD;  Location: [facility];  Service: Cardiovascular;  Laterality: N/A;   BRONCHIAL WASHINGS  09/05/2021   Procedure: BRONCHIAL WASHINGS;  Surgeon: Collene Gobble, MD;  Location: [facility];  Service: Cardiopulmonary;;   LAPAROSCOPIC [place] OOPHERECTOMY Right    TONSILLECTOMY AND ADENOIDECTOMY     VIDEO BRONCHOSCOPY N/A 09/05/2021   Procedure: VIDEO BRONCHOSCOPY WITHOUT FLUORO;  Surgeon: Collene Gobble, MD;  Location: [facility];  Service: Cardiopulmonary;  Laterality: N/A;   Patient Active Problem List   Diagnosis Date Noted   Acquired dilation of ascending aorta and aortic root ([place])  04/11/2022   Dyslipidemia 04/11/2022   Elevated coronary artery calcium score 04/11/2022   Enlarged aorta ([place]) 04/11/2022   URI (upper respiratory infection) 11/01/2021   Intermediate stage nonexudative age-related macular degeneration of both eyes 09/06/2021   Bronchiectasis without complication ([place]) 01/75/1025   Cough 08/09/2021   Pneumonia due to COVID-19 virus 07/05/2021   Secondary hypercoagulable state ([place]) 05/31/2021   Paroxysmal A-fib ([place]) 05/31/2021   Posterior capsular opacification non visually significant of left eye 07/19/2020   Left epiretinal membrane 07/19/2020   Right epiretinal membrane 07/19/2020   Low back pain 07/14/2020   Pain in left leg 07/14/2020   Educated about COVID-19 virus infection 05/30/2020   Hypercholesterolemia 03/11/2020   Atrial fibrillation ([place]) 03/11/2020   Sensorineural hearing loss (SNHL), bilateral 03/21/2018   Tinnitus of both ears 03/21/2018   Hypertensive disorder 06/30/2017   Osteoporosis 10/19/2016   Paroxysmal atrial fibrillation ([place]) 09/27/2015   Special screening for malignant neoplasms, colon 04/16/2014   Hemorrhoids, unspecified hemorrhoid type 04/16/2014   Foot callus 05/14/2013   Bilateral bunions 03/13/2013   Hypertrophic toenail 03/13/2013   Vision disturbance 03/13/2013   Post-nasal drainage 05/05/2012   Aortic insufficiency 01/16/2012   Macular degeneration 10/12/2011   ANXIETY, SITUATIONAL 05/08/2008   Vitamin D insufficiency 05/22/2007   ELEVATED BLOOD PRESSURE WITHOUT DIAGNOSIS OF HYPERTENSION 05/22/2007  Allergic rhinitis due to allergen 03/28/2007    REFERRING DIAG: L79.892J (ICD-10-CM) - Strain of right quadriceps, initial encounter  THERAPY DIAG:  Pain in right leg  Muscle weakness (generalized)  Difficulty in walking, not elsewhere classified  Rationale for Evaluation and Treatment Rehabilitation  PERTINENT HISTORY: Osteopenia (borderline osteoporosis), aortic insufficiency, A fib,  HTN  SUBJECTIVE: Patient reports she is feeling well today, she wasn't able to do her exercises this week due to being out of town.  PAIN:  Are you having pain? Yes: NPRS scale: 2/10 Pain location: Rt anterior thigh Pain description: Tight Aggravating factors: walking > 5 minutes, prolonged standing > 5 minutes Relieving factors: acetaminophen (used sparingly), massage   OBJECTIVE: (objective measures completed at initial evaluation unless otherwise dated)   DIAGNOSTIC FINDINGS: N/A   PATIENT SURVEYS:  FOTO 61%, predicted 73% in 12 visits   COGNITION:           Overall cognitive status: Within functional limits for tasks assessed                          SENSATION: Not tested     MUSCLE LENGTH: Hamstring 90/90 test: 50 degrees shy of full extension BIL Ely's test: Severely limited and painful BIL Ober's test: Severe limitation on Rt   POSTURE: weight shift left   PALPATION: TTP throughout Rt IT band, adductors   LOWER EXTREMITY ROM:   A/PROM Right eval Left eval  Hip abduction 40d, tightness    Hip adduction      Knee flexion WNL WNL  Knee extension WNL WNL   (Blank rows = not tested)   LOWER EXTREMITY MMT:   MMT Right eval Left eval  Hip flexion 3+/5 3+/5  Hip extension 3/5 3/5  Hip abduction 3/5 3/5  Hip adduction 3/5 3/5  Hip internal rotation 4/5 4/5  Hip external rotation 4/5 4/5  Knee flexion 4/5 4/5  Knee extension 4/5 4/5   (Blank rows = not tested)     FUNCTIONAL TESTS:  5xSTS: 15 seconds Squat: WNL SLS: WNL BIL, (+) Trendelenburg Lunge: Increased tension through Rt quad with Rt leg back   GAIT: Distance walked: 20 ft Assistive device utilized: None Level of assistance: Complete Independence Comments: Decreased gait speed, mild Rt antalgic gait       TODAY'S TREATMENT: [facility] Adult PT Treatment:                                                DATE: 06/24/2022 Therapeutic Exercise: Sidelying hip adduction 2x10 BIL Supine butterfly  stretch x12mn Seated active hamstring stretch x181m BIL Seated hamstring curls with 25# cable 3x8 with 3-sec return Lateral heel tap on 4-inch step 2x10 BIL with UE support Side knee plank with hip abduction 2x10 BIL Side lunge stretch with UE support x1m13mBIL Bulgarian split squat with UE support 2x10 BIL   [facility] Adult PT Treatment:                                                DATE: 06/13/2022 Therapeutic Exercise: Sidelying hip adduction with 2# ankle weight 2x10 BIL Seated butterfly stretch x2mi42meated active hamstring stretch x1min92mL Seated hamstring curls with  35# cable 3x8 with 3-sec return Lateral heel tap on 4-inch step 2x10 BIL with UE support Side knee plank with hip abduction 2x10 BIL Side lunge stretch with UE support x24mn BIL Manual Therapy: N/A Neuromuscular re-ed: N/A Therapeutic Activity: N/A Modalities: N/A Self Care: N/A  [facility] Adult PT Treatment:                                                DATE: 05/30/2022 Therapeutic Exercise: Modified pigeon stretch on edge of table x164m BIL Seated butterfly stretch x2m79mBulC[place]lit squat with UE support 2x10 BIL Standing Rt hip adduction with slow eccentric return with 3# cable to ankle at [employer] 2x10  Bridge with slow, eccentric heel slides 3x8 Side knee plank 2x10 BIL Manual Therapy: N/A Neuromuscular re-ed: N/A Therapeutic Activity: N/A Modalities: N/A Self Care: N/A    PATIENT EDUCATION:  Education details: Pt educated on probable underlying pathophysiology, POC, prognosis, FOTO, and HEP Person educated: Patient Education method: ExpConsulting civil engineeremonstration, and Handouts Education comprehension: verbalized understanding and returned demonstration     HOME EXERCISE PROGRAM: Access Code: [email or web address]: [email or web address] Date: 05/16/2022 Prepared by: TucVanessa DurhamExercises - Butterfly Groin Stretch  - 1 x daily - 7 x weekly - 2-min hold - Standing  Quad Stretch with Table and Chair Support  - 1 x daily - 7 x weekly - 2-min hold - Standing ITB Stretch  - 1 x daily - 7 x weekly - 2-min hold - Mini Squat with Counter Support  - 1 x daily - 7 x weekly - 3 sets - 12 reps - Sidelying Hip Abduction  - 1 x daily - 7 x weekly - 2 sets - 10 reps - 5-sec hold   ASSESSMENT:   CLINICAL IMPRESSION: Patient presents to PT with lessened pain in her R hip and leg and reports she hasn't been able to complete her HEP the last few days due to being out of town. Session today focused on BIL proximal hip and LE strengthening. She had some increase in pain with sideplank hip abductions in the R hip, but the pain subsided once the exercise was completed. Patient continues to benefit from skilled PT services and should be progressed as able to improve functional independence.    OBJECTIVE IMPAIRMENTS Abnormal gait, decreased balance, decreased endurance, decreased mobility, difficulty walking, decreased ROM, decreased strength, hypomobility, impaired flexibility, improper body mechanics, postural dysfunction, and pain.    ACTIVITY LIMITATIONS bending, standing, squatting, stairs, and locomotion level   PARTICIPATION LIMITATIONS: laundry, shopping, community activity, and yard work   PERSONAL FACTORS 3+ comorbidities: See medical hx  are also affecting patient's functional outcome.        GOALS: Goals reviewed with patient? Yes   SHORT TERM GOALS: Target date: 06/13/2022  Pt will report understanding and adherence to initial HEP in order to promote independence in the management of primary impairments. Baseline: HEP provided at eval Goal status: INITIAL     LONG TERM GOALS: Target date: 07/11/2022    Pt will achieve a FOTO score of 73% in order to demonstrate improved functional ability as it relates to her primary impairments.  Baseline: 61% Goal status: INITIAL   2.  Pt will report ability to walk >15 minutes with 0/10 pain in order to return to  walking track with less limitation. Baseline: >4/10  pain with 5 minutes of walking Goal status: INITIAL   3.  Pt will achieve global BIL hip and knee strength of 4+/5 or greater in order to perform ADLs such as getting down on the floor to clean under her couch with less limitation. Baseline: See MMT chart Goal status: INITIAL   4.  Pt will achieve a 5xSTS in 12 seconds or less in order to demonstrate safe transfers for community activity.  Baseline: 15 seconds Goal status: INITIAL         PLAN: PT FREQUENCY: 1x/week   PT DURATION: 8 weeks   PLANNED INTERVENTIONS: Therapeutic exercises, Therapeutic activity, Neuromuscular re-education, Balance training, Gait training, Patient/Family education, Self Care, Joint mobilization, Stair training, Orthotic/Fit training, Aquatic Therapy, Dry Needling, Electrical stimulation, Spinal mobilization, Cryotherapy, Moist heat, Taping, Biofeedback, Ionotophoresis '4mg'$ /ml Dexamethasone, Manual therapy, and Re-evaluation   PLAN FOR NEXT SESSION: Progress quad/ adductor eccentrics, global LE stretching    Margarette Canada, PTA 06/24/22 10:28 AM

## 2022-06-26 ENCOUNTER — Other Ambulatory Visit (HOSPITAL_COMMUNITY): Payer: Self-pay | Admitting: *Deleted

## 2022-06-26 MED ORDER — METOPROLOL TARTRATE 25 MG PO TABS
ORAL_TABLET | ORAL | 3 refills | Status: DC
Start: 2022-06-26 — End: 2023-06-01

## 2022-06-27 DIAGNOSIS — E785 Hyperlipidemia, unspecified: Secondary | ICD-10-CM | POA: Diagnosis not present

## 2022-06-27 LAB — LIPID PANEL
Chol/HDL Ratio: 1.9 ratio (ref 0.0–4.4)
Cholesterol, Total: 136 mg/dL (ref 100–199)
HDL: 72 mg/dL (ref >39)
LDL Chol Calc (NIH): 51 mg/dL (ref 0–99)
Triglycerides: 64 mg/dL (ref 0–149)
VLDL Cholesterol Cal: 13 mg/dL (ref 5–40)

## 2022-06-28 ENCOUNTER — Ambulatory Visit: Payer: Medicare Other

## 2022-06-28 ENCOUNTER — Encounter: Payer: Self-pay | Admitting: Internal Medicine

## 2022-06-28 DIAGNOSIS — R262 Difficulty in walking, not elsewhere classified: Secondary | ICD-10-CM

## 2022-06-28 DIAGNOSIS — M6281 Muscle weakness (generalized): Secondary | ICD-10-CM

## 2022-06-28 DIAGNOSIS — M79604 Pain in right leg: Secondary | ICD-10-CM | POA: Diagnosis not present

## 2022-06-28 NOTE — Therapy (Signed)
OUTPATIENT PHYSICAL THERAPY TREATMENT NOTE   Patient Name: [name] [name] MRN: [id number] DOB:1950-11-29, 71 y.o., female Today's Date: 06/28/2022  PCP: Burnis Medin, MD REFERRING PROVIDER: Glennon Mac, DO  END OF SESSION:   PT End of Session - 06/28/22 1048     Visit Number 6    Number of Visits 9    Date for PT Re-Evaluation 07/18/22    Authorization Type [employer] MCR    Authorization Time Period FOTO v6, v10, kx mod v15    PT Start Time 1003    PT Stop Time 1048    PT Time Calculation (min) 45 min    Activity Tolerance Patient tolerated treatment well    Behavior During Therapy [facility] for tasks assessed/performed                 Past Medical History:  Diagnosis Date   Allergic rhinitis    Aortic insufficiency    CAT SCRATCH 09/10/2009   Qualifier: Diagnosis of  By: Regis Bill MD, [name]    Clavicle fracture 12/2006   left   Diverticulosis    HTN (hypertension)    Osteopenia    Paroxysmal atrial fibrillation (HCC)    Pneumonia    PONV (postoperative nausea and vomiting)    Vision disturbance 03/13/2013   Negative neuro MRA MRI   Past Surgical History:  Procedure Laterality Date   ABDOMINAL HYSTERECTOMY     pt denies 04/15/14   APPENDECTOMY     ATRIAL FIBRILLATION ABLATION N/A 02/01/2021   Procedure: ATRIAL FIBRILLATION ABLATION;  Surgeon: Thompson Grayer, MD;  Location: [place] [facility];  Service: Cardiovascular;  Laterality: N/A;   BRONCHIAL WASHINGS  09/05/2021   Procedure: BRONCHIAL WASHINGS;  Surgeon: Collene Gobble, MD;  Location: [facility];  Service: Cardiopulmonary;;   LAPAROSCOPIC SALPINGO OOPHERECTOMY Right    TONSILLECTOMY AND ADENOIDECTOMY     VIDEO BRONCHOSCOPY N/A 09/05/2021   Procedure: VIDEO BRONCHOSCOPY WITHOUT FLUORO;  Surgeon: Collene Gobble, MD;  Location: [place];  Service: Cardiopulmonary;  Laterality: N/A;   Patient Active Problem List   Diagnosis Date Noted   Acquired dilation of ascending aorta and aortic root ([place])  04/11/2022   Dyslipidemia 04/11/2022   Elevated coronary artery calcium score 04/11/2022   Enlarged aorta ([place]) 04/11/2022   URI (upper respiratory infection) 11/01/2021   Intermediate stage nonexudative age-related macular degeneration of both eyes 09/06/2021   Bronchiectasis without complication ([place]) 67/20/9470   Cough 08/09/2021   Pneumonia due to COVID-19 virus 07/05/2021   Secondary hypercoagulable state ([place]) 05/31/2021   Paroxysmal A-fib ([place]) 05/31/2021   Posterior capsular opacification non visually significant of left eye 07/19/2020   Left epiretinal membrane 07/19/2020   Right epiretinal membrane 07/19/2020   Low back pain 07/14/2020   Pain in left leg 07/14/2020   Educated about COVID-19 virus infection 05/30/2020   Hypercholesterolemia 03/11/2020   Atrial fibrillation ([place]) 03/11/2020   Sensorineural hearing loss (SNHL), bilateral 03/21/2018   Tinnitus of both ears 03/21/2018   Hypertensive disorder 06/30/2017   Osteoporosis 10/19/2016   Paroxysmal atrial fibrillation ([place]) 09/27/2015   Special screening for malignant neoplasms, colon 04/16/2014   Hemorrhoids, unspecified hemorrhoid type 04/16/2014   Foot callus 05/14/2013   Bilateral bunions 03/13/2013   Hypertrophic toenail 03/13/2013   Vision disturbance 03/13/2013   Post-nasal drainage 05/05/2012   Aortic insufficiency 01/16/2012   Macular degeneration 10/12/2011   ANXIETY, SITUATIONAL 05/08/2008   Vitamin D insufficiency 05/22/2007   ELEVATED BLOOD PRESSURE WITHOUT DIAGNOSIS OF HYPERTENSION  05/22/2007   Allergic rhinitis due to allergen 03/28/2007    REFERRING DIAG: T62.563S (ICD-10-CM) - Strain of right quadriceps, initial encounter  THERAPY DIAG:  Pain in right leg  Muscle weakness (generalized)  Difficulty in walking, not elsewhere classified  Rationale for Evaluation and Treatment Rehabilitation  PERTINENT HISTORY: Osteopenia (borderline osteoporosis), aortic insufficiency, A fib,  HTN  SUBJECTIVE: Pt reports 0/10 pain currently. She adds that butterfly stretches can increase her pain for 24 hours.  PAIN:  Are you having pain? Yes: NPRS scale: 0/10 Pain location: Rt anterior thigh Pain description: Tight Aggravating factors: walking > 5 minutes, prolonged standing > 5 minutes Relieving factors: acetaminophen (used sparingly), massage   OBJECTIVE: (objective measures completed at initial evaluation unless otherwise dated)   DIAGNOSTIC FINDINGS: N/A   PATIENT SURVEYS:  FOTO 61%, predicted 73% in 12 visits  06/28/2022: 667%   COGNITION:           Overall cognitive status: Within functional limits for tasks assessed                          SENSATION: Not tested     MUSCLE LENGTH: Hamstring 90/90 test: 50 degrees shy of full extension BIL Ely's test: Severely limited and painful BIL Ober's test: Severe limitation on Rt   POSTURE: weight shift left   PALPATION: TTP throughout Rt IT band, adductors   LOWER EXTREMITY ROM:   A/PROM Right eval Left eval  Hip abduction 40d, tightness    Hip adduction      Knee flexion WNL WNL  Knee extension WNL WNL   (Blank rows = not tested)   LOWER EXTREMITY MMT:   MMT Right eval Left eval Right 06/28/2022 Left 06/28/2022  Hip flexion 3+/5 3+/5 4+/5 4+/5  Hip extension 3/5 3/5 3+/5 3+/5  Hip abduction 3/5 3/5 5/5 5/5  Hip adduction 3/5 3/5 4+/5 4+/5  Hip internal rotation 4/5 4/5 4/5 4+/5  Hip external rotation 4/5 4/5 3+/5 4+/5  Knee flexion 4/5 4/5 5/5 5/5  Knee extension 4/5 4/5 5/5 5/5   (Blank rows = not tested)     FUNCTIONAL TESTS:  5xSTS: 15 seconds Squat: WNL SLS: WNL BIL, (+) Trendelenburg Lunge: Increased tension through Rt quad with Rt leg back   GAIT: Distance walked: 20 ft Assistive device utilized: None Level of assistance: Complete Independence Comments: Decreased gait speed, mild Rt antalgic gait       TODAY'S TREATMENT:  [facility] Adult PT Treatment:                                                 DATE: 06/28/2022 Therapeutic Exercise: Kickstand stance pallof press with 3# cable into hip ER and IR on each side x6 with 5 second holds in all 4 directions Manual Therapy: N/A Neuromuscular re-ed: N/A Therapeutic Activity: Re-assessment of objective measures with pt education Re-administration of FOTO with pt education Dead lift with 10# kettlebell 3x10 Extensive pt education of mechanics of different exercises and anatomy of muscles that are being worked per pt request Modalities: N/A Self Care: N/A   [facility] Adult PT Treatment:  DATE: 06/24/2022 Therapeutic Exercise: Sidelying hip adduction 2x10 BIL Supine butterfly stretch x61mn Seated active hamstring stretch x163m BIL Seated hamstring curls with 25# cable 3x8 with 3-sec return Lateral heel tap on 4-inch step 2x10 BIL with UE support Side knee plank with hip abduction 2x10 BIL Side lunge stretch with UE support x1m28mBIL Bulgarian split squat with UE support 2x10 BIL   [facility] Adult PT Treatment:                                                DATE: 06/13/2022 Therapeutic Exercise: Sidelying hip adduction with 2# ankle weight 2x10 BIL Seated butterfly stretch x2mi59meated active hamstring stretch x1min65mL Seated hamstring curls with 35# cable 3x8 with 3-sec return Lateral heel tap on 4-inch step 2x10 BIL with UE support Side knee plank with hip abduction 2x10 BIL Side lunge stretch with UE support x1min 65m Manual Therapy: N/A Neuromuscular re-ed: N/A Therapeutic Activity: N/A Modalities: N/A Self Care: N/A   PATIENT EDUCATION:  Education details: Pt educated on probable underlying pathophysiology, POC, prognosis, FOTO, and HEP Person educated: Patient Education method: ExplanConsulting civil engineernstration, and Handouts Education comprehension: verbalized understanding and returned demonstration     HOME EXERCISE PROGRAM: Access Code: [email or web address] Date: 05/16/2022 Prepared by: TuckerVanessa Durhamrcises - Butterfly Groin Stretch  - 1 x daily - 7 x weekly - 2-min hold - Standing Quad Stretch with Table and Chair Support  - 1 x daily - 7 x weekly - 2-min hold - Standing ITB Stretch  - 1 x daily - 7 x weekly - 2-min hold - Mini Squat with Counter Support  - 1 x daily - 7 x weekly - 3 sets - 12 reps - Sidelying Hip Abduction  - 1 x daily - 7 x weekly - 2 sets - 10 reps - 5-sec hold   ASSESSMENT:   CLINICAL IMPRESSION: Pt responded well to all interventions today, demonstrating good form and no increase in pain with selected exercises. Upon re-assessment, the pt has seen significant improvements in functional walking ability, global hip strength, and FOTO score. She will continue to benefit from skilled PT to address her primary impairments and return to her prior level of function with less limitation.   OBJECTIVE IMPAIRMENTS Abnormal gait, decreased balance, decreased endurance, decreased mobility, difficulty walking, decreased ROM, decreased strength, hypomobility, impaired flexibility, improper body mechanics, postural dysfunction, and pain.    ACTIVITY LIMITATIONS bending, standing, squatting, stairs, and locomotion level   PARTICIPATION LIMITATIONS: laundry, shopping, community activity, and yard work   PERSONAL FACTORS 3+ comorbidities: See medical hx  are also affecting patient's functional outcome.        GOALS: Goals reviewed with patient? Yes   SHORT TERM GOALS: Target date: 06/13/2022  Pt will report understanding and adherence to initial HEP in order to promote independence in the management of primary impairments. Baseline: HEP provided at eval Goal status: INITIAL     LONG TERM GOALS: Target date: 07/11/2022    Pt will achieve a FOTO score of 73% in order to demonstrate improved functional ability as it relates to her primary impairments.  Baseline: 61% 06/28/2022: 67% Goal  status: IN PROGRESS   2.  Pt will report ability to walk >15 minutes with 0/10 pain in order to return to walking track with less limitation. Baseline: >4/10 pain  with 5 minutes of walking 06/28/2022: Pt reports ability to walk 1 hour with 2/10 pain Goal status: ACHIEVED   3.  Pt will achieve global BIL hip and knee strength of 4+/5 or greater in order to perform ADLs such as getting down on the floor to clean under her couch with less limitation. Baseline: See MMT chart 06/28/2022: See updated MMT chart Goal status: IN PROGRESS   4.  Pt will achieve a 5xSTS in 12 seconds or less in order to demonstrate safe transfers for community activity.  Baseline: 15 seconds Goal status: INITIAL         PLAN: PT FREQUENCY: 1x/week   PT DURATION: 8 weeks   PLANNED INTERVENTIONS: Therapeutic exercises, Therapeutic activity, Neuromuscular re-education, Balance training, Gait training, Patient/Family education, Self Care, Joint mobilization, Stair training, Orthotic/Fit training, Aquatic Therapy, Dry Needling, Electrical stimulation, Spinal mobilization, Cryotherapy, Moist heat, Taping, Biofeedback, Ionotophoresis '4mg'$ /ml Dexamethasone, Manual therapy, and Re-evaluation   PLAN FOR NEXT SESSION: Progress quad/ adductor eccentrics, global LE stretching    Vanessa , PT, DPT 06/28/22 10:56 AM

## 2022-06-30 NOTE — Telephone Encounter (Signed)
Schedule a follow up appt with Dr. Regis Bill.

## 2022-07-04 ENCOUNTER — Encounter: Payer: Self-pay | Admitting: Internal Medicine

## 2022-07-04 ENCOUNTER — Ambulatory Visit: Payer: Medicare Other

## 2022-07-04 ENCOUNTER — Telehealth (INDEPENDENT_AMBULATORY_CARE_PROVIDER_SITE_OTHER): Payer: Medicare Other | Admitting: Internal Medicine

## 2022-07-04 VITALS — BP 101/62 | HR 58 | Ht 64.0 in | Wt 94.0 lb

## 2022-07-04 DIAGNOSIS — R21 Rash and other nonspecific skin eruption: Secondary | ICD-10-CM | POA: Diagnosis not present

## 2022-07-04 NOTE — Telephone Encounter (Signed)
Spoke to patient. Inform her due to provider is out sick. Her visit is virtual. Gave patient instruction on how to upload pictures via mychart. Patient states she will try and call us back if have any question.

## 2022-07-04 NOTE — Progress Notes (Signed)
Virtual Visit via Video Note  I connected with [name] [name] on 07/04/22 at  4:00 PM EDT by a video enabled telemedicine application and verified that I am speaking with the correct person using two identifiers. Location patient: home Location provider: home office Persons participating in the virtual visit: patient, provider   Patient aware  of the limitations of evaluation and management by telemedicine and and agreed to proceed.  HPI: [name] [name] presents for video visit  she could see and hear me.  Bu t I not her  . Except she was able to send in pictures from this week . Finished the 3 weeks o cream and improved not as dry feeling  no itching and right leg  improving more than left   no itching or sx at this time.  Noted after a week  a few bumps like bug bites like chiggers but not itching.   Off topic since weekend and not worse .   Options   ROS: See pertinent positives and negatives per HPI.  Past Medical History:  Diagnosis Date   Allergic rhinitis    Aortic insufficiency    CAT SCRATCH 09/10/2009   Qualifier: Diagnosis of  By: Regis Bill MD, [name]    Clavicle fracture 12/2006   left   Diverticulosis    HTN (hypertension)    Osteopenia    Paroxysmal atrial fibrillation (HCC)    Pneumonia    PONV (postoperative nausea and vomiting)    Vision disturbance 03/13/2013   Negative neuro MRA MRI    Past Surgical History:  Procedure Laterality Date   ABDOMINAL HYSTERECTOMY     pt denies 04/15/14   APPENDECTOMY     ATRIAL FIBRILLATION ABLATION N/A 02/01/2021   Procedure: ATRIAL FIBRILLATION ABLATION;  Surgeon: Thompson Grayer, MD;  Location: [place] [facility];  Service: Cardiovascular;  Laterality: N/A;   BRONCHIAL WASHINGS  09/05/2021   Procedure: BRONCHIAL WASHINGS;  Surgeon: Collene Gobble, MD;  Location: [facility];  Service: Cardiopulmonary;;   LAPAROSCOPIC SALPINGO OOPHERECTOMY Right    TONSILLECTOMY AND ADENOIDECTOMY     VIDEO BRONCHOSCOPY N/A 09/05/2021    Procedure: VIDEO BRONCHOSCOPY WITHOUT FLUORO;  Surgeon: Collene Gobble, MD;  Location: [place];  Service: Cardiopulmonary;  Laterality: N/A;    Family History  Problem Relation Age of Onset   Scleroderma Mother    Hyperlipidemia Mother    Coronary artery disease Father 30   Esophageal cancer Paternal Grandmother     Social History   Tobacco Use   Smoking status: Never   Smokeless tobacco: Never  Substance Use Topics   Alcohol use: Not Currently   Drug use: No      Current Outpatient Medications:    alendronate (FOSAMAX) 70 MG tablet, TAKE 1 TABLET BY MOUTH  WEEKLY WITH A FULL GLASS OF WATER ON AN EMPTY STOMACH, Disp: 12 tablet, Rfl: 3   apixaban (ELIQUIS) 5 MG TABS tablet, TAKE 1 TABLET BY MOUTH TWICE  DAILY, Disp: 180 tablet, Rfl: 2   Calcium Citrate-Vitamin D (CITRACAL + D PO), Take by mouth. 1 per day, Disp: , Rfl:    Cholecalciferol (VITAMIN D3) 50 MCG (2000 UT) TABS, Take by mouth. 1 per day, Disp: , Rfl:    Coenzyme Q10 (COQ10) 100 MG CAPS, , Disp: , Rfl:    diltiazem (CARDIZEM) 30 MG tablet, TAKE 1 TABLET EVERY 4 HOURS AS NEEDED FOR BREAKTHROUGH AFIB, Disp: 45 tablet, Rfl: 1   diltiazem (TIAZAC) 300 MG 24 hr  capsule, TAKE 1 CAPSULE BY MOUTH  DAILY, Disp: 90 capsule, Rfl: 3   dofetilide (TIKOSYN) 250 MCG capsule, Take 1 capsule (250 mcg total) by mouth 2 (two) times daily., Disp: 180 capsule, Rfl: 3   LORazepam (ATIVAN) 0.5 MG tablet, Take 1 tablet (0.5 mg total) by mouth daily as needed for anxiety (when flying)., Disp: 20 tablet, Rfl: 0   metoprolol tartrate (LOPRESSOR) 25 MG tablet, Take 1 tablet in the AM and 2 tablets in the PM, Disp: 270 tablet, Rfl: 3   Multiple Vitamins-Minerals (PRESERVISION AREDS PO), Take 1 capsule by mouth in the morning and at bedtime., Disp: , Rfl:    potassium chloride (KLOR-CON) 10 MEQ tablet, Take 2 tablets (20 mEq total) by mouth daily., Disp: 180 tablet, Rfl: 3   pravastatin (PRAVACHOL) 20 MG tablet, Take 1 tablet (20 mg total) by  mouth every evening., Disp: 90 tablet, Rfl: 3   acetaminophen (TYLENOL) 500 MG tablet, Take 1,000 mg by mouth every 6 (six) hours as needed for moderate pain. (Patient not taking: Reported on 07/04/2022), Disp: , Rfl:   EXAM: BP Readings from Last 3 Encounters:  07/04/22 101/62  06/19/22 92/60  06/15/22 110/62    VITALS per patient if applicable:  GENERAL: alert, oriented, appears well and in no acute distress See pictures   in my chart .  Fading diffuse  confluent rash bilateral inner thighs  but fading color   no extension  PSYCH/NEURO: pleasant and cooperative, no obvious depression or anxiety, speech and thought processing grossly intact See pictures   ASSESSMENT AND PLAN:  Discussed the following assessment and plan:    ICD-10-CM   1. Rash  R21     Atypical  Slow improvement not as fast as would expect with fungal cause alone  but getting better    ok to monitor and fu in person  in 1-2 weeks  Sept 25  or contact after weekend as possible add on if needed next week .  ( She will be out of town in 2 weeks)  Counseled.   Expectant management and discussion of plan and treatment with opportunity to ask questions and all were answered. The patient agreed with the plan and demonstrated an understanding of the instructions.   Advised to call back or seek an in-person evaluation if worsening  or having  further concerns  in interim. Return for 1-3 weeks in person see note.    Shanon Ace, MD

## 2022-07-05 ENCOUNTER — Ambulatory Visit: Payer: Medicare Other | Attending: Sports Medicine

## 2022-07-05 DIAGNOSIS — R262 Difficulty in walking, not elsewhere classified: Secondary | ICD-10-CM | POA: Diagnosis not present

## 2022-07-05 DIAGNOSIS — M6281 Muscle weakness (generalized): Secondary | ICD-10-CM | POA: Insufficient documentation

## 2022-07-05 DIAGNOSIS — M79604 Pain in right leg: Secondary | ICD-10-CM | POA: Diagnosis not present

## 2022-07-05 NOTE — Therapy (Signed)
OUTPATIENT PHYSICAL THERAPY TREATMENT NOTE   Patient Name: [name] [name] MRN: [id number] DOB:12/11/1950, 71 y.o., female Today's Date: 07/05/2022  PCP: Burnis Medin, MD REFERRING PROVIDER: Glennon Mac, DO  END OF SESSION:   PT End of Session - 07/05/22 1350     Visit Number 7    Number of Visits 9    Date for PT Re-Evaluation 07/18/22    Authorization Type [employer] MCR    Authorization Time Period FOTO v6, v10, kx mod v15    PT Start Time 1350    PT Stop Time 1430    PT Time Calculation (min) 40 min    Activity Tolerance Patient tolerated treatment well    Behavior During Therapy WFL for tasks assessed/performed                  Past Medical History:  Diagnosis Date   Allergic rhinitis    Aortic insufficiency    CAT SCRATCH 09/10/2009   Qualifier: Diagnosis of  By: Regis Bill MD, [name]    Clavicle fracture 12/2006   left   Diverticulosis    HTN (hypertension)    Osteopenia    Paroxysmal atrial fibrillation (HCC)    Pneumonia    PONV (postoperative nausea and vomiting)    Vision disturbance 03/13/2013   Negative neuro MRA MRI   Past Surgical History:  Procedure Laterality Date   ABDOMINAL HYSTERECTOMY     pt denies 04/15/14   APPENDECTOMY     ATRIAL FIBRILLATION ABLATION N/A 02/01/2021   Procedure: ATRIAL FIBRILLATION ABLATION;  Surgeon: Thompson Grayer, MD;  Location: [facility];  Service: Cardiovascular;  Laterality: N/A;   BRONCHIAL WASHINGS  09/05/2021   Procedure: BRONCHIAL WASHINGS;  Surgeon: Collene Gobble, MD;  Location: [facility];  Service: Cardiopulmonary;;   LAPAROSCOPIC SALPINGO OOPHERECTOMY Right    TONSILLECTOMY AND ADENOIDECTOMY     VIDEO BRONCHOSCOPY N/A 09/05/2021   Procedure: VIDEO BRONCHOSCOPY WITHOUT FLUORO;  Surgeon: Collene Gobble, MD;  Location: [facility];  Service: Cardiopulmonary;  Laterality: N/A;   Patient Active Problem List   Diagnosis Date Noted   Acquired dilation of ascending aorta and aortic root ([place])  04/11/2022   Dyslipidemia 04/11/2022   Elevated coronary artery calcium score 04/11/2022   Enlarged aorta ([place]) 04/11/2022   URI (upper respiratory infection) 11/01/2021   Intermediate stage nonexudative age-related macular degeneration of both eyes 09/06/2021   Bronchiectasis without complication ([place]) 97/67/3419   Cough 08/09/2021   Pneumonia due to COVID-19 virus 07/05/2021   Secondary hypercoagulable state ([place]) 05/31/2021   Paroxysmal A-fib ([place]) 05/31/2021   Posterior capsular opacification non visually significant of left eye 07/19/2020   Left epiretinal membrane 07/19/2020   Right epiretinal membrane 07/19/2020   Low back pain 07/14/2020   Pain in left leg 07/14/2020   Educated about COVID-19 virus infection 05/30/2020   Hypercholesterolemia 03/11/2020   Atrial fibrillation ([place]) 03/11/2020   Sensorineural hearing loss (SNHL), bilateral 03/21/2018   Tinnitus of both ears 03/21/2018   Hypertensive disorder 06/30/2017   Osteoporosis 10/19/2016   Paroxysmal atrial fibrillation ([place]) 09/27/2015   Special screening for malignant neoplasms, colon 04/16/2014   Hemorrhoids, unspecified hemorrhoid type 04/16/2014   Foot callus 05/14/2013   Bilateral bunions 03/13/2013   Hypertrophic toenail 03/13/2013   Vision disturbance 03/13/2013   Post-nasal drainage 05/05/2012   Aortic insufficiency 01/16/2012   Macular degeneration 10/12/2011   ANXIETY, SITUATIONAL 05/08/2008   Vitamin D insufficiency 05/22/2007   ELEVATED BLOOD PRESSURE WITHOUT DIAGNOSIS OF  HYPERTENSION 05/22/2007   Allergic rhinitis due to allergen 03/28/2007    REFERRING DIAG: K74.259D (ICD-10-CM) - Strain of right quadriceps, initial encounter  THERAPY DIAG:  Pain in right leg  Muscle weakness (generalized)  Difficulty in walking, not elsewhere classified  Rationale for Evaluation and Treatment Rehabilitation  PERTINENT HISTORY: Osteopenia (borderline osteoporosis), aortic insufficiency, A fib,  HTN  SUBJECTIVE: Pt reports her hips and legs feel great today. She reports adherence to her HEP and walking program.   PAIN:  Are you having pain? Yes: NPRS scale: 0/10 Pain location: Rt anterior thigh Pain description: Tight Aggravating factors: walking > 5 minutes, prolonged standing > 5 minutes Relieving factors: acetaminophen (used sparingly), massage   OBJECTIVE: (objective measures completed at initial evaluation unless otherwise dated)   DIAGNOSTIC FINDINGS: N/A   PATIENT SURVEYS:  FOTO 61%, predicted 73% in 12 visits  06/28/2022: 67%   COGNITION:           Overall cognitive status: Within functional limits for tasks assessed                          SENSATION: Not tested     MUSCLE LENGTH: Hamstring 90/90 test: 50 degrees shy of full extension BIL Ely's test: Severely limited and painful BIL Ober's test: Severe limitation on Rt   POSTURE: weight shift left   PALPATION: TTP throughout Rt IT band, adductors   LOWER EXTREMITY ROM:   A/PROM Right eval Left eval  Hip abduction 40d, tightness    Hip adduction      Knee flexion WNL WNL  Knee extension WNL WNL   (Blank rows = not tested)   LOWER EXTREMITY MMT:   MMT Right eval Left eval Right 06/28/2022 Left 06/28/2022  Hip flexion 3+/5 3+/5 4+/5 4+/5  Hip extension 3/5 3/5 3+/5 3+/5  Hip abduction 3/5 3/5 5/5 5/5  Hip adduction 3/5 3/5 4+/5 4+/5  Hip internal rotation 4/5 4/5 4/5 4+/5  Hip external rotation 4/5 4/5 3+/5 4+/5  Knee flexion 4/5 4/5 5/5 5/5  Knee extension 4/5 4/5 5/5 5/5   (Blank rows = not tested)     FUNCTIONAL TESTS:  5xSTS: 15 seconds Squat: WNL SLS: WNL BIL, (+) Trendelenburg Lunge: Increased tension through Rt quad with Rt leg back   GAIT: Distance walked: 20 ft Assistive device utilized: None Level of assistance: Complete Independence Comments: Decreased gait speed, mild Rt antalgic gait       TODAY'S TREATMENT:  [facility] Adult PT Treatment:                                                 DATE: 07/05/2022 Therapeutic Exercise: Marcello Moores stretch x8mn BIL Single leg dead lift with UE support with 5# kettlebell 2x8 BIL Sidelying lumbar open book x10 BIL Side knee plank with hip abduction 2x10 BIL Seated BIL hip IR with YTB around forefeet 3x15 Mini-squat side steps with 7# cable to waist attachment 2x5 walkouts BIL RBenindead lift with 13# cable 3x10 Sidelying IT band stretch with back leg off edge of table x170m BIL Manual Therapy: N/A Neuromuscular re-ed: N/A Therapeutic Activity: N/A Modalities: N/A Self Care: N/A   [facility] Adult PT Treatment:  DATE: 06/28/2022 Therapeutic Exercise: Kickstand stance pallof press with 3# cable into hip ER and IR on each side x6 with 5 second holds in all 4 directions Manual Therapy: N/A Neuromuscular re-ed: N/A Therapeutic Activity: Re-assessment of objective measures with pt education Re-administration of FOTO with pt education Dead lift with 10# kettlebell 3x10 Extensive pt education of mechanics of different exercises and anatomy of muscles that are being worked per pt request Modalities: N/A Self Care: N/A   [facility] Adult PT Treatment:                                                DATE: 06/24/2022 Therapeutic Exercise: Sidelying hip adduction 2x10 BIL Supine butterfly stretch x14mn Seated active hamstring stretch x184m BIL Seated hamstring curls with 25# cable 3x8 with 3-sec return Lateral heel tap on 4-inch step 2x10 BIL with UE support Side knee plank with hip abduction 2x10 BIL Side lunge stretch with UE support [id number] Bulgarian split squat with UE support 2x10 BIL     PATIENT EDUCATION:  Education details: Pt educated on probable underlying pathophysiology, POC, prognosis, FOTO, and HEP Person educated: Patient Education method: ExpConsulting civil engineeremMedia plannernd Handouts Education comprehension: verbalized understanding and returned  demonstration     HOME EXERCISE PROGRAM: Access Code: [email or web address]: [email or web address] Date: 05/16/2022 Prepared by: TucVanessa DurhamExercises - Butterfly Groin Stretch  - 1 x daily - 7 x weekly - 2-min hold - Standing Quad Stretch with Table and Chair Support  - 1 x daily - 7 x weekly - 2-min hold - Standing ITB Stretch  - 1 x daily - 7 x weekly - 2-min hold - Mini Squat with Counter Support  - 1 x daily - 7 x weekly - 3 sets - 12 reps - Sidelying Hip Abduction  - 1 x daily - 7 x weekly - 2 sets - 10 reps - 5-sec hold   ASSESSMENT:   CLINICAL IMPRESSION: Pt responded excellently to progressed exercises today, demonstrating good form and no pain throughout the session. She will continue to benefit from skilled PT to address her primary impairments and return to her prior level of function with less limitation.    OBJECTIVE IMPAIRMENTS Abnormal gait, decreased balance, decreased endurance, decreased mobility, difficulty walking, decreased ROM, decreased strength, hypomobility, impaired flexibility, improper body mechanics, postural dysfunction, and pain.    ACTIVITY LIMITATIONS bending, standing, squatting, stairs, and locomotion level   PARTICIPATION LIMITATIONS: laundry, shopping, community activity, and yard work   PERSONAL FACTORS 3+ comorbidities: See medical hx  are also affecting patient's functional outcome.        GOALS: Goals reviewed with patient? Yes   SHORT TERM GOALS: Target date: 06/13/2022  Pt will report understanding and adherence to initial HEP in order to promote independence in the management of primary impairments. Baseline: HEP provided at eval Goal status: INITIAL     LONG TERM GOALS: Target date: 07/11/2022    Pt will achieve a FOTO score of 73% in order to demonstrate improved functional ability as it relates to her primary impairments.  Baseline: 61% 06/28/2022: 67% Goal status: IN PROGRESS   2.  Pt will report ability to walk  >15 minutes with 0/10 pain in order to return to walking track with less limitation. Baseline: >4/10 pain with 5 minutes of walking 06/28/2022: Pt reports ability to walk 1  hour with 2/10 pain Goal status: ACHIEVED   3.  Pt will achieve global BIL hip and knee strength of 4+/5 or greater in order to perform ADLs such as getting down on the floor to clean under her couch with less limitation. Baseline: See MMT chart 06/28/2022: See updated MMT chart Goal status: IN PROGRESS   4.  Pt will achieve a 5xSTS in 12 seconds or less in order to demonstrate safe transfers for community activity.  Baseline: 15 seconds Goal status: INITIAL         PLAN: PT FREQUENCY: 1x/week   PT DURATION: 8 weeks   PLANNED INTERVENTIONS: Therapeutic exercises, Therapeutic activity, Neuromuscular re-education, Balance training, Gait training, Patient/Family education, Self Care, Joint mobilization, Stair training, Orthotic/Fit training, Aquatic Therapy, Dry Needling, Electrical stimulation, Spinal mobilization, Cryotherapy, Moist heat, Taping, Biofeedback, Ionotophoresis '4mg'$ /ml Dexamethasone, Manual therapy, and Re-evaluation   PLAN FOR NEXT SESSION: Progress quad/ adductor eccentrics, global LE stretching    Vanessa Weatherford, PT, DPT 07/05/22 2:33 PM

## 2022-07-10 ENCOUNTER — Ambulatory Visit (INDEPENDENT_AMBULATORY_CARE_PROVIDER_SITE_OTHER): Payer: Medicare Other | Admitting: Internal Medicine

## 2022-07-10 VITALS — BP 106/54 | HR 60 | Temp 98.4°F | Wt 91.6 lb

## 2022-07-10 DIAGNOSIS — R21 Rash and other nonspecific skin eruption: Secondary | ICD-10-CM | POA: Diagnosis not present

## 2022-07-10 DIAGNOSIS — L304 Erythema intertrigo: Secondary | ICD-10-CM | POA: Diagnosis not present

## 2022-07-10 MED ORDER — KETOCONAZOLE 2 % EX CREA: 1.0000 | TOPICAL_CREAM | Freq: Every day | CUTANEOUS | 1 refills | Status: DC

## 2022-07-10 NOTE — Patient Instructions (Signed)
Looks a lot better    Can restart cream if needed  Otherwise .  Observe.

## 2022-07-10 NOTE — Progress Notes (Signed)
Chief Complaint  Patient presents with   Follow-up    On rash. Pt reports it is improving but it is still there- dry skin and redness.    HPI: [name] [name] 71 y.o. come in for  fu rash.     .   Is getting better but still present   just fan out of medication  ketoconazole   no new rash   to take trip  should she use the pool?  ROS: See pertinent positives and negatives per HPI.  Past Medical History:  Diagnosis Date   Allergic rhinitis    Aortic insufficiency    CAT SCRATCH 09/10/2009   Qualifier: Diagnosis of  By: Regis Bill MD, [name]    Clavicle fracture 12/2006   left   Diverticulosis    HTN (hypertension)    Osteopenia    Paroxysmal atrial fibrillation (HCC)    Pneumonia    PONV (postoperative nausea and vomiting)    Vision disturbance 03/13/2013   Negative neuro MRA MRI    Family History  Problem Relation Age of Onset   Scleroderma Mother    Hyperlipidemia Mother    Coronary artery disease Father 38   Esophageal cancer Paternal Grandmother     Social History   Socioeconomic History   Marital status: Married    Spouse name: Not on file   Number of children: 0   Years of education: Not on file   Highest education level: Bachelor's degree (e.g., BA, AB, BS)  Occupational History   Occupation: Veterinary surgeon   Occupation: REPORTER    Employer: [employer]  Tobacco Use   Smoking status: Never   Smokeless tobacco: Never  Substance and Sexual Activity   Alcohol use: Not Currently   Drug use: No   Sexual activity: Not on file  Other Topics Concern   Not on file  Social History Narrative   Married   hh of 2    p0g0   Works for [employer]    Social Determinants of Radio broadcast assistant Strain: [place]  (03/26/2022)   Overall Financial Resource Strain (CARDIA)    Difficulty of Paying Living Expenses: Not hard at all  Food Insecurity: No Food Insecurity (03/26/2022)   Hunger Vital Sign    Worried About Running Out of Food in the Last  Year: Never true    [place] in the Last Year: Never true  Transportation Needs: No Transportation Needs (03/26/2022)   PRAPARE - Hydrologist (Medical): No    Lack of Transportation (Non-Medical): No  Physical Activity: Insufficiently Active (03/26/2022)   Exercise Vital Sign    Days of Exercise per Week: 3 days    Minutes of Exercise per Session: 30 min  Stress: No Stress Concern Present (03/26/2022)   [place]    Feeling of Stress : Only a little  Social Connections: Socially Integrated (03/26/2022)   Social Connection and Isolation Panel [NHANES]    Frequency of Communication with Friends and Family: More than three times a week    Frequency of Social Gatherings with Friends and Family: Twice a week    Attends Religious Services: 1 to 4 times per year    Active Member of [employer] or Organizations: Yes    Attends Music therapist: More than 4 times per year    Marital Status: Married    Outpatient Medications Prior  to Visit  Medication Sig Dispense Refill   alendronate (FOSAMAX) 70 MG tablet TAKE 1 TABLET BY MOUTH  WEEKLY WITH A FULL GLASS OF WATER ON AN EMPTY STOMACH 12 tablet 3   apixaban (ELIQUIS) 5 MG TABS tablet TAKE 1 TABLET BY MOUTH TWICE  DAILY 180 tablet 2   Calcium Citrate-Vitamin D (CITRACAL + D PO) Take by mouth. 1 per day     Cholecalciferol (VITAMIN D3) 50 MCG (2000 UT) TABS Take by mouth. 1 per day     Coenzyme Q10 (COQ10) 100 MG CAPS      diltiazem (CARDIZEM) 30 MG tablet TAKE 1 TABLET EVERY 4 HOURS AS NEEDED FOR BREAKTHROUGH AFIB 45 tablet 1   diltiazem (TIAZAC) 300 MG 24 hr capsule TAKE 1 CAPSULE BY MOUTH  DAILY 90 capsule 3   LORazepam (ATIVAN) 0.5 MG tablet Take 1 tablet (0.5 mg total) by mouth daily as needed for anxiety (when flying). 20 tablet 0   metoprolol tartrate (LOPRESSOR) 25 MG tablet Take 1 tablet in the AM and 2 tablets in the PM 270 tablet 3    Multiple Vitamins-Minerals (PRESERVISION AREDS PO) Take 1 capsule by mouth in the morning and at bedtime.     potassium chloride (KLOR-CON) 10 MEQ tablet Take 2 tablets (20 mEq total) by mouth daily. 180 tablet 3   pravastatin (PRAVACHOL) 20 MG tablet Take 1 tablet (20 mg total) by mouth every evening. 90 tablet 3   dofetilide (TIKOSYN) 250 MCG capsule Take 1 capsule (250 mcg total) by mouth 2 (two) times daily. 180 capsule 3   acetaminophen (TYLENOL) 500 MG tablet Take 1,000 mg by mouth every 6 (six) hours as needed for moderate pain. (Patient not taking: Reported on 07/10/2022)     No facility-administered medications prior to visit.     EXAM:  BP (!) 106/54 (BP Location: Left Arm, Patient Position: Sitting, Cuff Size: Small)   Pulse 60   Temp 98.4 F (36.9 C) (Oral)   Wt 91 lb 9.6 oz (41.5 kg)   SpO2 94%   BMI 15.72 kg/m   Body mass index is 15.72 kg/m.  GENERAL: vitals reviewed and listed above, alert, oriented, appears well hydrated and in no acute distress HEENT: atraumatic, conjunctiva  clear, no obvious abnormalities on inspection of external nose and ears  Anterior thights   fading rahs pigmentation  no redness   hyperpigmented  but with indistinct  border   MS: moves all extremities without noticeable focal  abnormality PSYCH: pleasant and cooperative, no obvious depression or anxiety  BP Readings from Last 3 Encounters:  07/10/22 (!) 106/54  07/04/22 101/62  06/19/22 92/60    ASSESSMENT AND PLAN:  Discussed the following assessment and plan:  Rash  Intertrigo - Plan: ketoconazole (NIZORAL) 2 % cream Fading rash   observe but reill med incase flares up I think the  color change is fading  is as curious presentation.    Consider erythrasma but  not typical and getting better .  Fortuantely dose not seem  clinically alarming  -Patient advised to return or notify health care team  if  new concerns arise.  Patient Instructions  Looks a lot better    Can restart  cream if needed  Otherwise .  Observe.     [name]. Kaveh Kissinger M.D.

## 2022-07-11 ENCOUNTER — Ambulatory Visit: Payer: Medicare Other

## 2022-07-11 DIAGNOSIS — M79604 Pain in right leg: Secondary | ICD-10-CM | POA: Diagnosis not present

## 2022-07-11 DIAGNOSIS — R262 Difficulty in walking, not elsewhere classified: Secondary | ICD-10-CM | POA: Diagnosis not present

## 2022-07-11 DIAGNOSIS — M6281 Muscle weakness (generalized): Secondary | ICD-10-CM

## 2022-07-11 NOTE — Therapy (Signed)
OUTPATIENT PHYSICAL THERAPY TREATMENT NOTE/ RE-CERTIFICATION   Patient Name: [name] [name] MRN: [id number] DOB:Apr 04, 1951, 71 y.o., female Today's Date: 07/11/2022  PCP: Burnis Medin, MD REFERRING PROVIDER: Glennon Mac, DO  END OF SESSION:   PT End of Session - 07/11/22 1300     Visit Number 8    Number of Visits 12    Date for PT Re-Evaluation 08/15/22    Authorization Type UHC MCR    Authorization Time Period FOTO v6, v10, kx mod v15    PT Start Time 1300    PT Stop Time 1340    PT Time Calculation (min) 40 min    Activity Tolerance Patient tolerated treatment well    Behavior During Therapy [facility] for tasks assessed/performed                   Past Medical History:  Diagnosis Date   Allergic rhinitis    Aortic insufficiency    CAT SCRATCH 09/10/2009   Qualifier: Diagnosis of  By: Regis Bill MD, [name]    Clavicle fracture 12/2006   left   Diverticulosis    HTN (hypertension)    Osteopenia    Paroxysmal atrial fibrillation (HCC)    Pneumonia    PONV (postoperative nausea and vomiting)    Vision disturbance 03/13/2013   Negative neuro MRA MRI   Past Surgical History:  Procedure Laterality Date   ABDOMINAL HYSTERECTOMY     pt denies 04/15/14   APPENDECTOMY     ATRIAL FIBRILLATION ABLATION N/A 02/01/2021   Procedure: ATRIAL FIBRILLATION ABLATION;  Surgeon: Thompson Grayer, MD;  Location: [facility];  Service: Cardiovascular;  Laterality: N/A;   BRONCHIAL WASHINGS  09/05/2021   Procedure: BRONCHIAL WASHINGS;  Surgeon: Collene Gobble, MD;  [facility];  Service: Cardiopulmonary;;   LAPAROSCOPIC SALPINGO OOPHERECTOMY Right    TONSILLECTOMY AND ADENOIDECTOMY     VIDEO BRONCHOSCOPY N/A 09/05/2021   Procedure: VIDEO BRONCHOSCOPY WITHOUT FLUORO;  Surgeon: Collene Gobble, MD;  Location: [facility];  Service: Cardiopulmonary;  Laterality: N/A;   Patient Active Problem List   Diagnosis Date Noted   Acquired dilation of ascending aorta and  aortic root ([place]) 04/11/2022   Dyslipidemia 04/11/2022   Elevated coronary artery calcium score 04/11/2022   Enlarged aorta ([place]) 04/11/2022   URI (upper respiratory infection) 11/01/2021   Intermediate stage nonexudative age-related macular degeneration of both eyes 09/06/2021   Bronchiectasis without complication ([place]) 09/98/3382   Cough 08/09/2021   Pneumonia due to COVID-19 virus 07/05/2021   Secondary hypercoagulable state ([place]) 05/31/2021   Paroxysmal A-fib ([place]) 05/31/2021   Posterior capsular opacification non visually significant of left eye 07/19/2020   Left epiretinal membrane 07/19/2020   Right epiretinal membrane 07/19/2020   Low back pain 07/14/2020   Pain in left leg 07/14/2020   Educated about COVID-19 virus infection 05/30/2020   Hypercholesterolemia 03/11/2020   Atrial fibrillation ([place]) 03/11/2020   Sensorineural hearing loss (SNHL), bilateral 03/21/2018   Tinnitus of both ears 03/21/2018   Hypertensive disorder 06/30/2017   Osteoporosis 10/19/2016   Paroxysmal atrial fibrillation ([place]) 09/27/2015   Special screening for malignant neoplasms, colon 04/16/2014   Hemorrhoids, unspecified hemorrhoid type 04/16/2014   Foot callus 05/14/2013   Bilateral bunions 03/13/2013   Hypertrophic toenail 03/13/2013   Vision disturbance 03/13/2013   Post-nasal drainage 05/05/2012   Aortic insufficiency 01/16/2012   Macular degeneration 10/12/2011   ANXIETY, SITUATIONAL 05/08/2008   Vitamin D insufficiency 05/22/2007   ELEVATED BLOOD PRESSURE WITHOUT  DIAGNOSIS OF HYPERTENSION 05/22/2007   Allergic rhinitis due to allergen 03/28/2007    REFERRING DIAG: G26.948N (ICD-10-CM) - Strain of right quadriceps, initial encounter  THERAPY DIAG:  Pain in right leg - Plan: PT plan of care cert/re-cert  Muscle weakness (generalized) - Plan: PT plan of care cert/re-cert  Difficulty in walking, not elsewhere classified - Plan: PT plan of care cert/re-cert  Rationale for Evaluation  and Treatment Rehabilitation  PERTINENT HISTORY: Osteopenia (borderline osteoporosis), aortic insufficiency, A fib, HTN  SUBJECTIVE: Pt reports continued improvements in her concordant Rt thigh pain, adding that she can tell she is getting better. Pt reports daily adherence to her HEP.   PAIN:  Are you having pain? Yes: NPRS scale: 0/10 Pain location: Rt anterior thigh Pain description: Tight Aggravating factors: walking > 5 minutes, prolonged standing > 5 minutes Relieving factors: acetaminophen (used sparingly), massage   OBJECTIVE: (objective measures completed at initial evaluation unless otherwise dated)   DIAGNOSTIC FINDINGS: N/A   PATIENT SURVEYS:  FOTO 61%, predicted 73% in 12 visits  06/28/2022: 67%  07/11/2022: 65%   COGNITION:           Overall cognitive status: Within functional limits for tasks assessed                          SENSATION: Not tested     MUSCLE LENGTH: Hamstring 90/90 test: 50 degrees shy of full extension BIL Ely's test: Severely limited and painful BIL Ober's test: Severe limitation on Rt   POSTURE: weight shift left   PALPATION: TTP throughout Rt IT band, adductors   LOWER EXTREMITY ROM:   A/PROM Right eval Left eval  Hip abduction 40d, tightness    Hip adduction      Knee flexion WNL WNL  Knee extension WNL WNL   (Blank rows = not tested)   LOWER EXTREMITY MMT:   MMT Right eval Left eval Right 06/28/2022 Left 06/28/2022 Right 07/11/2022 Left 07/11/2022  Hip flexion 3+/5 3+/5 4+/5 4+/5 5/5 5/5  Hip extension 3/5 3/5 3+/5 3+/5 4/5 4/5  Hip abduction 3/5 3/5 5/5 5/5    Hip adduction 3/5 3/5 4+/5 4+/5 5/5 5/5  Hip internal rotation 4/5 4/5 4/5 4+/5 5/5 5/5  Hip external rotation 4/5 4/5 3+/5 4+/5 4/5 4+/5  Knee flexion 4/5 4/5 5/5 5/5    Knee extension 4/5 4/5 5/5 5/5     (Blank rows = not tested)     FUNCTIONAL TESTS:  5xSTS: 15 seconds Squat: WNL SLS: WNL BIL, (+) Trendelenburg Lunge: Increased tension through Rt  quad with Rt leg back  07/11/2022: 12 seconds    GAIT: Distance walked: 20 ft Assistive device utilized: None Level of assistance: Complete Independence Comments: Decreased gait speed, mild Rt antalgic gait       TODAY'S TREATMENT:  [facility] Adult PT Treatment:                                                DATE: 07/11/2022 Therapeutic Exercise: Bridge with 15# bumper plate on Airex pad over pelvis 3x10 Mini-squat side steps with 7# cable to waist attachment at Free Motion machine 2x5 BIL Standing donkey kicks with 7# cable 2x10 BIL Manual Therapy: N/A Neuromuscular re-ed: N/A Therapeutic Activity: Re-assessment of FOTO with pt education Re-assessment of objective measures with pt education Modalities:  N/A Self Care: N/A   [facility] Adult PT Treatment:                                                DATE: 07/05/2022 Therapeutic Exercise: Marcello Moores stretch x72mn BIL Single leg dead lift with UE support with 5# kettlebell 2x8 BIL Sidelying lumbar open book x10 BIL Side knee plank with hip abduction 2x10 BIL Seated BIL hip IR with YTB around forefeet 3x15 Mini-squat side steps with 7# cable to waist attachment 2x5 walkouts BIL RBenindead lift with 13# cable 3x10 Sidelying IT band stretch with back leg off edge of table x172m BIL Manual Therapy: N/A Neuromuscular re-ed: N/A Therapeutic Activity: N/A Modalities: N/A Self Care: N/A   [facility] Adult PT Treatment:                                                DATE: 06/28/2022 Therapeutic Exercise: Kickstand stance pallof press with 3# cable into hip ER and IR on each side x6 with 5 second holds in all 4 directions Manual Therapy: N/A Neuromuscular re-ed: N/A Therapeutic Activity: Re-assessment of objective measures with pt education Re-administration of FOTO with pt education Dead lift with 10# kettlebell 3x10 Extensive pt education of mechanics of different exercises and anatomy of muscles that are being worked per pt  request Modalities: N/A Self Care: N/A     PATIENT EDUCATION:  Education details: Pt educated on probable underlying pathophysiology, POC, prognosis, FOTO, and HEP Person educated: Patient Education method: ExConsulting civil engineerDeMedia plannerand Handouts Education comprehension: verbalized understanding and returned demonstration     HOME EXERCISE PROGRAM: Access Code: [email or web address]: [email or web address] Date: 05/16/2022 Prepared by: TuVanessa [place] Exercises - Butterfly Groin Stretch  - 1 x daily - 7 x weekly - 2-min hold - Standing Quad Stretch with Table and Chair Support  - 1 x daily - 7 x weekly - 2-min hold - Standing ITB Stretch  - 1 x daily - 7 x weekly - 2-min hold - Mini Squat with Counter Support  - 1 x daily - 7 x weekly - 3 sets - 12 reps - Sidelying Hip Abduction  - 1 x daily - 7 x weekly - 2 sets - 10 reps - 5-sec hold   ASSESSMENT:   CLINICAL IMPRESSION: Upon re-assessment of objective measures, the pt continues to make progress in hip strength, although she is still limited by pain with functional activities. The pt requests additional visits to assist with her functional capacity as it relates to her concordant pain and weakness. She will continue to benefit from skilled PT to address her primary impairments and return to her prior level of function with less limitation.    OBJECTIVE IMPAIRMENTS Abnormal gait, decreased balance, decreased endurance, decreased mobility, difficulty walking, decreased ROM, decreased strength, hypomobility, impaired flexibility, improper body mechanics, postural dysfunction, and pain.    ACTIVITY LIMITATIONS bending, standing, squatting, stairs, and locomotion level   PARTICIPATION LIMITATIONS: laundry, shopping, community activity, and yard work   PERSONAL FACTORS 3+ comorbidities: See medical hx  are also affecting patient's functional outcome.        GOALS: Goals reviewed with patient? Yes   SHORT TERM GOALS:  Target date: 06/13/2022  Pt will  report understanding and adherence to initial HEP in order to promote independence in the management of primary impairments. Baseline: HEP provided at eval Goal status: INITIAL     LONG TERM GOALS: Target date: 07/11/2022    Pt will achieve a FOTO score of 73% in order to demonstrate improved functional ability as it relates to her primary impairments.  Baseline: 61% 06/28/2022: 67% 07/11/2022: 65% Goal status: IN PROGRESS   2.  Pt will report ability to walk >15 minutes with 0/10 pain in order to return to walking track with less limitation. Baseline: >4/10 pain with 5 minutes of walking 06/28/2022: Pt reports ability to walk 1 hour with 2/10 pain Goal status: ACHIEVED   3.  Pt will achieve global BIL hip and knee strength of 4+/5 or greater in order to perform ADLs such as getting down on the floor to clean under her couch with less limitation. Baseline: See MMT chart 06/28/2022: See updated MMT chart 07/11/2022: See updated MMT chart Goal status: IN PROGRESS   4.  Pt will achieve a 5xSTS in 12 seconds or less in order to demonstrate safe transfers for community activity.  Baseline: 15 seconds 07/11/2022: 12 seconds Goal status: ACHIEVED         PLAN: PT FREQUENCY: 1x/week   PT DURATION: 4 weeks   PLANNED INTERVENTIONS: Therapeutic exercises, Therapeutic activity, Neuromuscular re-education, Balance training, Gait training, Patient/Family education, Self Care, Joint mobilization, Stair training, Orthotic/Fit training, Aquatic Therapy, Dry Needling, Electrical stimulation, Spinal mobilization, Cryotherapy, Moist heat, Taping, Biofeedback, Ionotophoresis '4mg'$ /ml Dexamethasone, Manual therapy, and Re-evaluation   PLAN FOR NEXT SESSION: Progress quad/ adductor eccentrics, global LE stretching    Vanessa [place], PT, DPT 07/11/22 1:43 PM

## 2022-07-13 ENCOUNTER — Other Ambulatory Visit (HOSPITAL_COMMUNITY): Payer: Self-pay | Admitting: Physician Assistant

## 2022-07-16 ENCOUNTER — Encounter: Payer: Self-pay | Admitting: Internal Medicine

## 2022-07-26 ENCOUNTER — Ambulatory Visit: Payer: Medicare Other

## 2022-07-26 DIAGNOSIS — M79604 Pain in right leg: Secondary | ICD-10-CM | POA: Diagnosis not present

## 2022-07-26 DIAGNOSIS — R262 Difficulty in walking, not elsewhere classified: Secondary | ICD-10-CM

## 2022-07-26 DIAGNOSIS — M6281 Muscle weakness (generalized): Secondary | ICD-10-CM

## 2022-07-26 NOTE — Therapy (Signed)
OUTPATIENT PHYSICAL THERAPY TREATMENT NOTE/ RE-CERTIFICATION   Patient Name: SHANDELLE BORRELLI MRN: [id number] DOB:[birth year], 71 y.o., female Today's Date: 07/26/2022  PCP: Burnis Medin, MD REFERRING PROVIDER: Glennon Mac, DO  END OF SESSION:   PT End of Session - 07/26/22 1048     Visit Number 9    Number of Visits 12    Date for PT Re-Evaluation 08/15/22    Authorization Type UHC MCR    Authorization Time Period FOTO v6, v10, kx mod v15    PT Start Time 3235    PT Stop Time 1130    PT Time Calculation (min) 42 min    Activity Tolerance Patient tolerated treatment well    Behavior During Therapy WFL for tasks assessed/performed                    Past Medical History:  Diagnosis Date   Allergic rhinitis    Aortic insufficiency    CAT SCRATCH 09/10/2009   Qualifier: Diagnosis of  By: Regis Bill MD, [name]    Clavicle fracture 12/2006   left   Diverticulosis    HTN (hypertension)    Osteopenia    Paroxysmal atrial fibrillation (HCC)    Pneumonia    PONV (postoperative nausea and vomiting)    Vision disturbance 03/13/2013   Negative neuro MRA MRI   Past Surgical History:  Procedure Laterality Date   ABDOMINAL HYSTERECTOMY     pt denies 04/15/14   APPENDECTOMY     ATRIAL FIBRILLATION ABLATION N/A 02/01/2021   Procedure: ATRIAL FIBRILLATION ABLATION;  Surgeon: Thompson Grayer, MD;  Location: [facility];  Service: Cardiovascular;  Laterality: N/A;   BRONCHIAL WASHINGS  09/05/2021   Procedure: BRONCHIAL WASHINGS;  Surgeon: Collene Gobble, MD;  [facility];  Service: Cardiopulmonary;;   LAPAROSCOPIC SALPINGO OOPHERECTOMY Right    TONSILLECTOMY AND ADENOIDECTOMY     VIDEO BRONCHOSCOPY N/A 09/05/2021   Procedure: VIDEO BRONCHOSCOPY WITHOUT FLUORO;  Surgeon: Collene Gobble, MD;  Location: [facility];  Service: Cardiopulmonary;  Laterality: N/A;   Patient Active Problem List   Diagnosis Date Noted   Acquired dilation of ascending aorta and  aortic root ([place]) 04/11/2022   Dyslipidemia 04/11/2022   Elevated coronary artery calcium score 04/11/2022   Enlarged aorta ([place]) 04/11/2022   URI (upper respiratory infection) 11/01/2021   Intermediate stage nonexudative age-related macular degeneration of both eyes 09/06/2021   Bronchiectasis without complication ([place]) 57/32/2025   Cough 08/09/2021   Pneumonia due to COVID-19 virus 07/05/2021   Secondary hypercoagulable state ([place]) 05/31/2021   Paroxysmal A-fib ([place]) 05/31/2021   Posterior capsular opacification non visually significant of left eye 07/19/2020   Left epiretinal membrane 07/19/2020   Right epiretinal membrane 07/19/2020   Low back pain 07/14/2020   Pain in left leg 07/14/2020   Educated about COVID-19 virus infection 05/30/2020   Hypercholesterolemia 03/11/2020   Atrial fibrillation ([place]) 03/11/2020   Sensorineural hearing loss (SNHL), bilateral 03/21/2018   Tinnitus of both ears 03/21/2018   Hypertensive disorder 06/30/2017   Osteoporosis 10/19/2016   Paroxysmal atrial fibrillation ([place]) 09/27/2015   Special screening for malignant neoplasms, colon 04/16/2014   Hemorrhoids, unspecified hemorrhoid type 04/16/2014   Foot callus 05/14/2013   Bilateral bunions 03/13/2013   Hypertrophic toenail 03/13/2013   Vision disturbance 03/13/2013   Post-nasal drainage 05/05/2012   Aortic insufficiency 01/16/2012   Macular degeneration 10/12/2011   ANXIETY, SITUATIONAL 05/08/2008   Vitamin D insufficiency 05/22/2007   ELEVATED BLOOD PRESSURE  WITHOUT DIAGNOSIS OF HYPERTENSION 05/22/2007   Allergic rhinitis due to allergen 03/28/2007    REFERRING DIAG: D63.875I (ICD-10-CM) - Strain of right quadriceps, initial encounter  THERAPY DIAG:  Pain in right leg  Muscle weakness (generalized)  Difficulty in walking, not elsewhere classified  Rationale for Evaluation and Treatment Rehabilitation  PERTINENT HISTORY: Osteopenia (borderline osteoporosis), aortic insufficiency, A  fib, HTN  SUBJECTIVE: Pt reports she is feeling much better, adding that she was able to go on her trip without noticing her pain much the entire trip. Pt reports [facility]   PAIN:  Are you having pain? Yes: NPRS scale: 1/10 Pain location: Rt anterior thigh Pain description: Tight Aggravating factors: walking > 5 minutes, prolonged standing > 5 minutes Relieving factors: acetaminophen (used sparingly), massage   OBJECTIVE: (objective measures completed at initial evaluation unless otherwise dated)   DIAGNOSTIC FINDINGS: N/A   PATIENT SURVEYS:  FOTO 61%, predicted 73% in 12 visits  06/28/2022: 67%  07/11/2022: 65%   COGNITION:           Overall cognitive status: Within functional limits for tasks assessed                          SENSATION: Not tested     MUSCLE LENGTH: Hamstring 90/90 test: 50 degrees shy of full extension BIL Ely's test: Severely limited and painful BIL Ober's test: Severe limitation on Rt   POSTURE: weight shift left   PALPATION: TTP throughout Rt IT band, adductors   LOWER EXTREMITY ROM:   A/PROM Right eval Left eval  Hip abduction 40d, tightness    Hip adduction      Knee flexion WNL WNL  Knee extension WNL WNL   (Blank rows = not tested)   LOWER EXTREMITY MMT:   MMT Right eval Left eval Right 06/28/2022 Left 06/28/2022 Right 07/11/2022 Left 07/11/2022  Hip flexion 3+/5 3+/5 4+/5 4+/5 5/5 5/5  Hip extension 3/5 3/5 3+/5 3+/5 4/5 4/5  Hip abduction 3/5 3/5 5/5 5/5    Hip adduction 3/5 3/5 4+/5 4+/5 5/5 5/5  Hip internal rotation 4/5 4/5 4/5 4+/5 5/5 5/5  Hip external rotation 4/5 4/5 3+/5 4+/5 4/5 4+/5  Knee flexion 4/5 4/5 5/5 5/5    Knee extension 4/5 4/5 5/5 5/5     (Blank rows = not tested)     FUNCTIONAL TESTS:  5xSTS: 15 seconds Squat: WNL SLS: WNL BIL, (+) Trendelenburg Lunge: Increased tension through Rt quad with Rt leg back  07/11/2022: 5xSTS: 12 seconds    GAIT: Distance  walked: 20 ft Assistive device utilized: None Level of assistance: Complete Independence Comments: Decreased gait speed, mild Rt antalgic gait       TODAY'S TREATMENT:  [facility] Adult PT Treatment:                                                DATE: 07/26/2022 Therapeutic Exercise: Supine 90/90 mini bicycles with handhold resistance through thighs 3x20 Standing hamstring curls with 3# cable 2x10 BIL with UE support Standing hip flexion with subsequent knee extension with 3# cable 2x10 BIL with UE support Standing hip adduction with 3# cable with slow return 2x10 BIL with UE support  Manual Therapy: N/A Neuromuscular re-ed: N/A Therapeutic Activity: 10# kettlebell [place] deadlift  with intstructions for proper lifting form 3x10 Front-to-back alternating bouncing weight shifts with alternating punching in order provide graded exposure for Zumba workouts 3x1 min Squat twist punches 3x1 min Modalities: N/A Self Care: N/A   [facility] Adult PT Treatment:                                                DATE: 07/11/2022 Therapeutic Exercise: Bridge with 15# bumper plate on Airex pad over pelvis 3x10 Mini-squat side steps with 7# cable to waist attachment at Free Motion machine 2x5 BIL Standing donkey kicks with 7# cable 2x10 BIL Manual Therapy: N/A Neuromuscular re-ed: N/A Therapeutic Activity: Re-assessment of FOTO with pt education Re-assessment of objective measures with pt education Modalities: N/A Self Care: N/A   [facility] Adult PT Treatment:                                                DATE: 07/05/2022 Therapeutic Exercise: Marcello Moores stretch x45mn BIL Single leg dead lift with UE support with 5# kettlebell 2x8 BIL Sidelying lumbar open book x10 BIL Side knee plank with hip abduction 2x10 BIL Seated BIL hip IR with YTB around forefeet 3x15 Mini-squat side steps with 7# cable to waist attachment 2x5 walkouts BIL RBenindead lift with 13# cable 3x10 Sidelying IT band stretch with  back leg off edge of table x133m BIL Manual Therapy: N/A Neuromuscular re-ed: N/A Therapeutic Activity: N/A Modalities: N/A Self Care: N/A    PATIENT EDUCATION:  Education details: Pt educated on probable underlying pathophysiology, POC, prognosis, FOTO, and HEP Person educated: Patient Education method: ExConsulting civil engineerDemonstration, and Handouts Education comprehension: verbalized understanding and returned demonstration     HOME EXERCISE PROGRAM: Access Code: [email or web address]: [email or web address] Date: 05/16/2022 Prepared by: TuVanessa [place] Exercises - Butterfly Groin Stretch  - 1 x daily - 7 x weekly - 2-min hold - Standing Quad Stretch with Table and Chair Support  - 1 x daily - 7 x weekly - 2-min hold - Standing ITB Stretch  - 1 x daily - 7 x weekly - 2-min hold - Mini Squat with Counter Support  - 1 x daily - 7 x weekly - 3 sets - 12 reps - Sidelying Hip Abduction  - 1 x daily - 7 x weekly - 2 sets - 10 reps - 5-sec hold  Added 07/26/2022: - Twist Punch in Standing  - 1 x daily - 7 x weekly - 3 sets - 60 reps - Standing Boxing Jabs  - 1 x daily - 7 x weekly - 3 sets - 20 reps   ASSESSMENT:   CLINICAL IMPRESSION: Pt responded well to global hip and knee strengthening today, and she also enjoyed early graded exposure to aerobic activity including bouncing to prepare her for return to Zumba classes. The pt will continue to benefit from skilled PT to address her primary impairments and return to her prior level of function with less limitation.   OBJECTIVE IMPAIRMENTS Abnormal gait, decreased balance, decreased endurance, decreased mobility, difficulty walking, decreased ROM, decreased strength, hypomobility, impaired flexibility, improper body mechanics, postural dysfunction, and pain.    ACTIVITY LIMITATIONS bending, standing, squatting, stairs, and locomotion level   PARTICIPATION LIMITATIONS: laundry, shopping, community activity, and  yard work    PERSONAL FACTORS 3+ comorbidities: See medical hx  are also affecting patient's functional outcome.        GOALS: Goals reviewed with patient? Yes   SHORT TERM GOALS: Target date: 06/13/2022  Pt will report understanding and adherence to initial HEP in order to promote independence in the management of primary impairments. Baseline: HEP provided at eval Goal status: INITIAL     LONG TERM GOALS: Target date: 07/11/2022    Pt will achieve a FOTO score of 73% in order to demonstrate improved functional ability as it relates to her primary impairments.  Baseline: 61% 06/28/2022: 67% 07/11/2022: 65% Goal status: IN PROGRESS   2.  Pt will report ability to walk >15 minutes with 0/10 pain in order to return to walking track with less limitation. Baseline: >4/10 pain with 5 minutes of walking 06/28/2022: Pt reports ability to walk 1 hour with 2/10 pain Goal status: ACHIEVED   3.  Pt will achieve global BIL hip and knee strength of 4+/5 or greater in order to perform ADLs such as getting down on the floor to clean under her couch with less limitation. Baseline: See MMT chart 06/28/2022: See updated MMT chart 07/11/2022: See updated MMT chart Goal status: IN PROGRESS   4.  Pt will achieve a 5xSTS in 12 seconds or less in order to demonstrate safe transfers for community activity.  Baseline: 15 seconds 07/11/2022: 12 seconds Goal status: ACHIEVED         PLAN: PT FREQUENCY: 1x/week   PT DURATION: 4 weeks   PLANNED INTERVENTIONS: Therapeutic exercises, Therapeutic activity, Neuromuscular re-education, Balance training, Gait training, Patient/Family education, Self Care, Joint mobilization, Stair training, Orthotic/Fit training, Aquatic Therapy, Dry Needling, Electrical stimulation, Spinal mobilization, Cryotherapy, Moist heat, Taping, Biofeedback, Ionotophoresis '4mg'$ /ml Dexamethasone, Manual therapy, and Re-evaluation   PLAN FOR NEXT SESSION: Progress quad/ adductor eccentrics, global LE  stretching    Vanessa [place], PT, DPT 07/26/22 11:36 AM

## 2022-07-31 DIAGNOSIS — L570 Actinic keratosis: Secondary | ICD-10-CM | POA: Diagnosis not present

## 2022-07-31 DIAGNOSIS — C44319 Basal cell carcinoma of skin of other parts of face: Secondary | ICD-10-CM | POA: Diagnosis not present

## 2022-07-31 DIAGNOSIS — L814 Other melanin hyperpigmentation: Secondary | ICD-10-CM | POA: Diagnosis not present

## 2022-07-31 DIAGNOSIS — D492 Neoplasm of unspecified behavior of bone, soft tissue, and skin: Secondary | ICD-10-CM | POA: Diagnosis not present

## 2022-08-02 ENCOUNTER — Ambulatory Visit (HOSPITAL_BASED_OUTPATIENT_CLINIC_OR_DEPARTMENT_OTHER)
Admission: RE | Admit: 2022-08-02 | Discharge: 2022-08-02 | Disposition: A | Discharge status: Home / Self Care | Payer: Medicare Other | Source: Ambulatory Visit | Attending: Emergency Medicine | Admitting: Emergency Medicine

## 2022-08-02 ENCOUNTER — Encounter (HOSPITAL_BASED_OUTPATIENT_CLINIC_OR_DEPARTMENT_OTHER): Payer: Self-pay

## 2022-08-02 ENCOUNTER — Ambulatory Visit: Payer: Medicare Other | Attending: Sports Medicine

## 2022-08-02 DIAGNOSIS — J479 Bronchiectasis, uncomplicated: Secondary | ICD-10-CM | POA: Diagnosis not present

## 2022-08-02 DIAGNOSIS — I77810 Thoracic aortic ectasia: Secondary | ICD-10-CM | POA: Diagnosis not present

## 2022-08-02 DIAGNOSIS — M79604 Pain in right leg: Secondary | ICD-10-CM | POA: Insufficient documentation

## 2022-08-02 DIAGNOSIS — M6281 Muscle weakness (generalized): Secondary | ICD-10-CM | POA: Insufficient documentation

## 2022-08-02 DIAGNOSIS — R262 Difficulty in walking, not elsewhere classified: Secondary | ICD-10-CM | POA: Insufficient documentation

## 2022-08-02 DIAGNOSIS — I2510 Atherosclerotic heart disease of native coronary artery without angina pectoris: Secondary | ICD-10-CM | POA: Insufficient documentation

## 2022-08-02 DIAGNOSIS — I700 Atherosclerosis of aorta: Secondary | ICD-10-CM | POA: Diagnosis not present

## 2022-08-02 NOTE — Therapy (Signed)
OUTPATIENT PHYSICAL THERAPY TREATMENT NOTE/ RE-CERTIFICATION   Patient Name: [name] [name] MRN: [id number] DOB:1951-01-20, 71 y.o., female Today's Date: 08/02/2022  PCP: Burnis Medin, MD REFERRING PROVIDER: Glennon Mac, DO  END OF SESSION:   PT End of Session - 08/02/22 1036     Visit Number 10    Number of Visits 12    Date for PT Re-Evaluation 08/15/22    Authorization Type UHC MCR    Authorization Time Period FOTO v6, v10, kx mod v15    PT Start Time 3710    PT Stop Time 1121    PT Time Calculation (min) 41 min    Activity Tolerance Patient tolerated treatment well    Behavior During Therapy [facility] for tasks assessed/performed               Past Medical History:  Diagnosis Date   Allergic rhinitis    Aortic insufficiency    CAT SCRATCH 09/10/2009   Qualifier: Diagnosis of  By: Regis Bill MD, [name]    Clavicle fracture 12/2006   left   Diverticulosis    HTN (hypertension)    Osteopenia    Paroxysmal atrial fibrillation (HCC)    Pneumonia    PONV (postoperative nausea and vomiting)    Vision disturbance 03/13/2013   Negative neuro MRA MRI   Past Surgical History:  Procedure Laterality Date   ABDOMINAL HYSTERECTOMY     pt denies 04/15/14   APPENDECTOMY     ATRIAL FIBRILLATION ABLATION N/A 02/01/2021   Procedure: ATRIAL FIBRILLATION ABLATION;  Surgeon: Thompson Grayer, MD;  Location: [place] [facility];  Service: Cardiovascular;  Laterality: N/A;   BRONCHIAL WASHINGS  09/05/2021   Procedure: BRONCHIAL WASHINGS;  Surgeon: Collene Gobble, MD;  Location: [facility];  Service: Cardiopulmonary;;   LAPAROSCOPIC SALPINGO OOPHERECTOMY Right    TONSILLECTOMY AND ADENOIDECTOMY     VIDEO BRONCHOSCOPY N/A 09/05/2021   Procedure: VIDEO BRONCHOSCOPY WITHOUT FLUORO;  Surgeon: Collene Gobble, MD;  Location: [facility];  Service: Cardiopulmonary;  Laterality: N/A;   Patient Active Problem List   Diagnosis Date Noted   Acquired dilation of ascending aorta and aortic  root ([place]) 04/11/2022   Dyslipidemia 04/11/2022   Elevated coronary artery calcium score 04/11/2022   Enlarged aorta ([place]) 04/11/2022   URI (upper respiratory infection) 11/01/2021   Intermediate stage nonexudative age-related macular degeneration of both eyes 09/06/2021   Bronchiectasis without complication ([place]) 62/69/4854   Cough 08/09/2021   Pneumonia due to COVID-19 virus 07/05/2021   Secondary hypercoagulable state ([place]) 05/31/2021   Paroxysmal A-fib ([place]) 05/31/2021   Posterior capsular opacification non visually significant of left eye 07/19/2020   Left epiretinal membrane 07/19/2020   Right epiretinal membrane 07/19/2020   Low back pain 07/14/2020   Pain in left leg 07/14/2020   Educated about COVID-19 virus infection 05/30/2020   Hypercholesterolemia 03/11/2020   Atrial fibrillation ([place]) 03/11/2020   Sensorineural hearing loss (SNHL), bilateral 03/21/2018   Tinnitus of both ears 03/21/2018   Hypertensive disorder 06/30/2017   Osteoporosis 10/19/2016   Paroxysmal atrial fibrillation ([place]) 09/27/2015   Special screening for malignant neoplasms, colon 04/16/2014   Hemorrhoids, unspecified hemorrhoid type 04/16/2014   Foot callus 05/14/2013   Bilateral bunions 03/13/2013   Hypertrophic toenail 03/13/2013   Vision disturbance 03/13/2013   Post-nasal drainage 05/05/2012   Aortic insufficiency 01/16/2012   Macular degeneration 10/12/2011   ANXIETY, SITUATIONAL 05/08/2008   Vitamin D insufficiency 05/22/2007   ELEVATED BLOOD PRESSURE WITHOUT DIAGNOSIS OF HYPERTENSION 05/22/2007  Allergic rhinitis due to allergen 03/28/2007    REFERRING DIAG: F57.322G (ICD-10-CM) - Strain of right quadriceps, initial encounter  THERAPY DIAG:  Pain in right leg  Muscle weakness (generalized)  Difficulty in walking, not elsewhere classified  Rationale for Evaluation and Treatment Rehabilitation  PERTINENT HISTORY: Osteopenia (borderline osteoporosis), aortic insufficiency, A fib,  HTN  SUBJECTIVE:  Patient reports that her hip pain gets to a 1/10 when it it is bothering her. She reports she has not tried going back to [place] yet. She also reports that also found out yesterday that she has basal cell carcinoma from a biopsy that was tone.  PAIN:  Are you having pain? Yes: NPRS scale: 1/10 Pain location: Rt anterior thigh Pain description: Tight Aggravating factors: walking > 5 minutes, prolonged standing > 5 minutes Relieving factors: acetaminophen (used sparingly), massage   OBJECTIVE: (objective measures completed at initial evaluation unless otherwise dated)   DIAGNOSTIC FINDINGS: N/A   PATIENT SURVEYS:  FOTO 61%, predicted 73% in 12 visits  06/28/2022: 67%  07/11/2022: 65%   COGNITION:           Overall cognitive status: Within functional limits for tasks assessed                          SENSATION: Not tested     MUSCLE LENGTH: Hamstring 90/90 test: 50 degrees shy of full extension BIL Ely's test: Severely limited and painful BIL Ober's test: Severe limitation on Rt   POSTURE: weight shift left   PALPATION: TTP throughout Rt IT band, adductors   LOWER EXTREMITY ROM:   A/PROM Right eval Left eval  Hip abduction 40d, tightness    Hip adduction      Knee flexion WNL WNL  Knee extension WNL WNL   (Blank rows = not tested)   LOWER EXTREMITY MMT:   MMT Right eval Left eval Right 06/28/2022 Left 06/28/2022 Right 07/11/2022 Left 07/11/2022  Hip flexion 3+/5 3+/5 4+/5 4+/5 5/5 5/5  Hip extension 3/5 3/5 3+/5 3+/5 4/5 4/5  Hip abduction 3/5 3/5 5/5 5/5    Hip adduction 3/5 3/5 4+/5 4+/5 5/5 5/5  Hip internal rotation 4/5 4/5 4/5 4+/5 5/5 5/5  Hip external rotation 4/5 4/5 3+/5 4+/5 4/5 4+/5  Knee flexion 4/5 4/5 5/5 5/5    Knee extension 4/5 4/5 5/5 5/5     (Blank rows = not tested)     FUNCTIONAL TESTS:  5xSTS: 15 seconds Squat: WNL SLS: WNL BIL, (+) Trendelenburg Lunge: Increased tension through Rt quad with Rt leg  back  07/11/2022: 5xSTS: 12 seconds    GAIT: Distance walked: 20 ft Assistive device utilized: None Level of assistance: Complete Independence Comments: Decreased gait speed, mild Rt antalgic gait       TODAY'S TREATMENT: [facility] Adult PT Treatment:                                                DATE: 08/02/22 Therapeutic Exercise: Supine 90/90 mini bicycles with handhold resistance through thighs 2x30", 1x20" Omega knee extension 10# 2x10 Omega knee flexion 25# 2x10 Standing hip adduction with 3# cable with slow return 2x10 BIL with UE support Standing hip flexion with subsequent knee extension with 3# cable 2x10 Rt only with UE support (when performing on Lt, pt had increase in pain in Rt  hip) Thomas stretch x18mn BIL LTR x10 BIL Sidelying lumbar open book x10 BIL Side knee plank with hip abduction 2x10 BIL   [facility] Adult PT Treatment:                                                DATE: 07/26/2022 Therapeutic Exercise: Supine 90/90 mini bicycles with handhold resistance through thighs 3x20 Standing hamstring curls with 3# cable 2x10 BIL with UE support Standing hip flexion with subsequent knee extension with 3# cable 2x10 BIL with UE support Standing hip adduction with 3# cable with slow return 2x10 BIL with UE support  Manual Therapy: N/A Neuromuscular re-ed: N/A Therapeutic Activity: 10# kettlebell RBenindeadlift with intstructions for proper lifting form 3x10 Front-to-back alternating bouncing weight shifts with alternating punching in order provide graded exposure for Zumba workouts 3x1 min Squat twist punches 3x1 min Modalities: N/A Self Care: N/A   [facility] Adult PT Treatment:                                                DATE: 07/11/2022 Therapeutic Exercise: Bridge with 15# bumper plate on Airex pad over pelvis 3x10 Mini-squat side steps with 7# cable to waist attachment at Free Motion machine 2x5 BIL Standing donkey kicks with 7# cable 2x10 BIL Manual  Therapy: N/A Neuromuscular re-ed: N/A Therapeutic Activity: Re-assessment of FOTO with pt education Re-assessment of objective measures with pt education Modalities: N/A Self Care: N/A    PATIENT EDUCATION:  Education details: Pt educated on probable underlying pathophysiology, POC, prognosis, FOTO, and HEP Person educated: Patient Education method: EConsulting civil engineer Demonstration, and Handouts Education comprehension: verbalized understanding and returned demonstration     HOME EXERCISE PROGRAM: Access Code: [email or web address]: [email or web address] Date: 05/16/2022 Prepared by: TVanessa [place]  Exercises - Butterfly Groin Stretch  - 1 x daily - 7 x weekly - 2-min hold - Standing Quad Stretch with Table and Chair Support  - 1 x daily - 7 x weekly - 2-min hold - Standing ITB Stretch  - 1 x daily - 7 x weekly - 2-min hold - Mini Squat with Counter Support  - 1 x daily - 7 x weekly - 3 sets - 12 reps - Sidelying Hip Abduction  - 1 x daily - 7 x weekly - 2 sets - 10 reps - 5-sec hold  Added 07/26/2022: - Twist Punch in Standing  - 1 x daily - 7 x weekly - 3 sets - 60 reps - Standing Boxing Jabs  - 1 x daily - 7 x weekly - 3 sets - 20 reps   ASSESSMENT:   CLINICAL IMPRESSION: Patient presents to PT with reports of very mild pain in her Rt hip and leg and reports that it gets to a 1/10 at the worst. She has not yet returned to her Zumba classes. Session today focused on BIL LE strengthening and stretching. Patient was able to tolerate all prescribed exercises with no adverse effects. Patient continues to benefit from skilled PT services and should be progressed as able to improve functional independence.    OBJECTIVE IMPAIRMENTS Abnormal gait, decreased balance, decreased endurance, decreased mobility, difficulty walking, decreased ROM, decreased strength, hypomobility, impaired flexibility, improper body mechanics, postural dysfunction, and pain.  ACTIVITY LIMITATIONS  bending, standing, squatting, stairs, and locomotion level   PARTICIPATION LIMITATIONS: laundry, shopping, community activity, and yard work   PERSONAL FACTORS 3+ comorbidities: See medical hx  are also affecting patient's functional outcome.        GOALS: Goals reviewed with patient? Yes   SHORT TERM GOALS: Target date: 06/13/2022  Pt will report understanding and adherence to initial HEP in order to promote independence in the management of primary impairments. Baseline: HEP provided at eval Goal status: MET     LONG TERM GOALS: Target date: 07/11/2022    Pt will achieve a FOTO score of 73% in order to demonstrate improved functional ability as it relates to her primary impairments.  Baseline: 61% 06/28/2022: 67% 07/11/2022: 65% Goal status: IN PROGRESS   2.  Pt will report ability to walk >15 minutes with 0/10 pain in order to return to walking track with less limitation. Baseline: >4/10 pain with 5 minutes of walking 06/28/2022: Pt reports ability to walk 1 hour with 2/10 pain Goal status: ACHIEVED   3.  Pt will achieve global BIL hip and knee strength of 4+/5 or greater in order to perform ADLs such as getting down on the floor to clean under her couch with less limitation. Baseline: See MMT chart 06/28/2022: See updated MMT chart 07/11/2022: See updated MMT chart Goal status: IN PROGRESS   4.  Pt will achieve a 5xSTS in 12 seconds or less in order to demonstrate safe transfers for community activity.  Baseline: 15 seconds 07/11/2022: 12 seconds Goal status: ACHIEVED         PLAN: PT FREQUENCY: 1x/week   PT DURATION: 4 weeks   PLANNED INTERVENTIONS: Therapeutic exercises, Therapeutic activity, Neuromuscular re-education, Balance training, Gait training, Patient/Family education, Self Care, Joint mobilization, Stair training, Orthotic/Fit training, Aquatic Therapy, Dry Needling, Electrical stimulation, Spinal mobilization, Cryotherapy, Moist heat, Taping, Biofeedback,  Ionotophoresis 64m/ml Dexamethasone, Manual therapy, and Re-evaluation   PLAN FOR NEXT SESSION: Progress quad/ adductor eccentrics, global LE stretching    SMargarette [place] PTA 08/02/22 11:22 AM

## 2022-08-03 DIAGNOSIS — C44319 Basal cell carcinoma of skin of other parts of face: Secondary | ICD-10-CM | POA: Diagnosis not present

## 2022-08-05 ENCOUNTER — Other Ambulatory Visit: Payer: Self-pay | Admitting: Cardiology

## 2022-08-07 ENCOUNTER — Telehealth: Payer: Self-pay | Admitting: Pharmacist

## 2022-08-07 NOTE — Progress Notes (Deleted)
Chronic Care Management Pharmacy Note  08/07/2022 Name:  [name] [name] MRN:  [id number] DOB:  1951-03-16  Summary: Pt reports she still has a cough but this has improved significantly BP is mostly at goal < 140/90 LDL at goal < 100 and significantly improved   Recommendations/Changes made from today's visit: -Recommended taking omeprazole prior to dinner to separate from Fosamax -Recommended continued BP monitoring at home   Plan: Follow up BP assessment in 3 months Follow up in 6 months  Subjective: [name] [name] is an 71 y.o. year old female who is a primary patient of [name], Neta Mends, MD.  The CCM team was consulted for assistance with disease management and care coordination needs.    Engaged with patient by telephone for follow up visit in response to provider referral for pharmacy case management and/or care coordination services.   Consent to Services:  The patient was given information about Chronic Care Management services, agreed to services, and gave verbal consent prior to initiation of services.  Please see initial visit note for detailed documentation.   Patient Care Team: [name], Neta Mends, MD as PCP - General (Internal Medicine) Rollene Rotunda, MD as PCP - Cardiology (Cardiology) Hillis Range, MD as PCP - Electrophysiology (Cardiology) Ernesto Rutherford, MD (Ophthalmology) Rollene Rotunda, MD as Consulting Physician (Cardiology) Verner Chol, [facility] as Pharmacist (Pharmacist)  Recent office visits: 07/10/22 [name] Andreas, MD: Patient presented for rash follow up.  Refilled ketoconazole cream.  07/04/22 [name] Andreas, MD: Patient presented for rash follow up. Follow up in person in 1-2 weeks.  06/19/22 [name] Andreas, MD: Patient presented for rash on thighs. Follow up in 2 weeks.  06/15/22 Abbe Amsterdam, MD: Patient presented for a rash. Prescribed ketoconazole cream daily.   06/07/22 Gershon Crane, MD: Patient presented for a rash.   03/28/2022 Gershon Crane MD - Patient  was seen for right thigh pain. Referral to Sport Medicine. No medication changes. No follow up noted.   Recent consult visits: 08/02/22 Berta Minor, PTA (outpatient rehab): Patient presented for PT treatment for right leg.  06/12/22 Richardean Sale, DO (sports medicine): Patient presented for strain of right quadriceps. Follow up as needed.  05/04/2022 Richardean Sale DO (sport med) - Patient was seen for Strain of right quadriceps, initial encounter. Referral to Physical Therapy. No medication changes. Follow up in 4 weeks.    04/17/2022 Maxine Glenn PA-C (heartcare) - Patient was seen for PAF (paroxysmal atrial fibrillation) and additional issues. Discontinued Calcium + D3. Follow up in 6 months.    04/12/2022 Rollene Rotunda MD (cardiology) - Patient was seen for Nonrheumatic aortic valve insufficiency and additional issues. Started Pravastatin 20 mg. Follow up in 6 months.    04/06/2022 Richardean Sale DO (sport med) - Patient was seen for  Right hip pain and an additional issue. No medication changes. Follow up in 4 weeks.   01/31/22 Levy Pupa, MD (pulmonary): Patient presented for chronic cough follow up. Prescribed omeprazole 20 mg daily and loratadine 10 mg daily for possible silent GERD and contribution to chronic allergies or congestion.  09/29/2021 Carlus Pavlov MD (endocrinology) - Patient was seen for age related osteoporosis and an additional issue. No medication changes. Follow up in 1 year.  Hospital visits: None in previous 6 months  Objective:  Lab Results  Component Value Date   CREATININE 0.70 04/17/2022   BUN 19 04/17/2022   GFR 88.98 01/11/2022   GFRNONAA >60 06/24/2021   GFRAA 86 09/28/2020   NA 141 04/17/2022  K 4.4 04/17/2022   CALCIUM 10.2 04/17/2022   CO2 28 04/17/2022   GLUCOSE 70 04/17/2022    Lab Results  Component Value Date/Time   GFR 88.98 01/11/2022 10:45 AM   GFR 84.89 05/17/2018 09:12 AM    Last diabetic Eye exam:  Lab  Results  Component Value Date/Time   HMDIABEYEEXA Normal done by Dr.Groat 12/07/2011 12:00 AM    Last diabetic Foot exam: No results found for: "HMDIABFOOTEX"   Lab Results  Component Value Date   CHOL 136 06/27/2022   HDL 72 06/27/2022   LDLCALC 51 06/27/2022   LDLDIRECT 131.0 09/03/2009   TRIG 64 06/27/2022   CHOLHDL 1.9 06/27/2022       Latest Ref Rng & Units 01/11/2022   10:45 AM 06/24/2021   10:54 AM 05/17/2018    9:12 AM  Hepatic Function  Total Protein 6.0 - 8.3 g/dL 8.2  6.6  7.4   Albumin 3.5 - 5.2 g/dL 4.1  2.8  4.5   AST 0 - 37 U/L $Remo'19  26  21   'yTAXN$ ALT 0 - 35 U/L $Remo'14  18  23   'ahyCa$ Alk Phosphatase 39 - 117 U/L 71  67  37   Total Bilirubin 0.2 - 1.2 mg/dL 0.5  1.2  0.7   Bilirubin, Direct 0.0 - 0.3 mg/dL 0.0   0.1     Lab Results  Component Value Date/Time   TSH 1.17 01/11/2022 10:45 AM   TSH 0.891 06/24/2021 12:32 PM   TSH 0.82 09/28/2020 08:22 AM   FREET4 1.00 12/08/2011 08:05 AM       Latest Ref Rng & Units 01/11/2022   10:45 AM 06/24/2021   10:54 AM 01/10/2021    7:55 AM  CBC  WBC 4.0 - 10.5 K/uL 7.6  20.5  6.0   Hemoglobin 12.0 - 15.0 g/dL 13.3  13.8  13.7   Hematocrit 36.0 - 46.0 % 40.4  41.3  40.6   Platelets 150.0 - 400.0 K/uL 312.0  303  226     Lab Results  Component Value Date/Time   VD25OH 42.31 09/29/2021 08:34 AM   VD25OH 44.3 09/28/2020 08:22 AM   VD25OH 57.64 11/20/2019 08:06 AM    Clinical ASCVD: No  The 10-year ASCVD risk score (Arnett DK, et al., 2019) is: 6.5%   Values used to calculate the score:     Age: 4 years     Sex: Female     Is Non-Hispanic African American: No     Diabetic: No     Tobacco smoker: No     Systolic Blood Pressure: 797 mmHg     Is BP treated: Yes     HDL Cholesterol: 72 mg/dL     Total Cholesterol: 136 mg/dL       07/10/2022    4:30 PM 06/19/2022   11:54 AM 06/15/2022   11:10 AM  Depression screen PHQ 2/9  Decreased Interest 0 0 0  Down, Depressed, Hopeless 0 0 0  PHQ - 2 Score 0 0 0  Altered sleeping 0  0 0  Tired, decreased energy 0 0 0  Change in appetite 0 0 0  Feeling bad or failure about yourself  0 0 0  Trouble concentrating 0 0 0  Moving slowly or fidgety/restless 0 0 0  Suicidal thoughts 0 0 0  PHQ-9 Score 0 0 0  Difficult doing work/chores Not difficult at all Not difficult at all      CHA2DS2/VAS Stroke Risk Points  Current as of 4 hours ago     3 >= 2 Points: High Risk  1 - 1.99 Points: Medium Risk  0 Points: Low Risk    Last Change: N/A      Details    This score determines the patient's risk of having a stroke if the  patient has atrial fibrillation.       Points Metrics  0 Has Congestive Heart Failure:  No    Current as of 4 hours ago  0 Has Vascular Disease:  No    Current as of 4 hours ago  1 Has Hypertension:  Yes    Current as of 4 hours ago  1 Age:  69    Current as of 4 hours ago  0 Has Diabetes:  No    Current as of 4 hours ago  0 Had Stroke:  No  Had TIA:  No  Had Thromboembolism:  No    Current as of 4 hours ago  1 Female:  Yes    Current as of 4 hours ago     Social History   Tobacco Use  Smoking Status Never  Smokeless Tobacco Never   BP Readings from Last 3 Encounters:  07/10/22 (!) 106/54  07/04/22 101/62  06/19/22 92/60   Pulse Readings from Last 3 Encounters:  07/10/22 60  07/04/22 (!) 58  06/19/22 65   Wt Readings from Last 3 Encounters:  07/10/22 91 lb 9.6 oz (41.5 kg)  07/04/22 94 lb (42.6 kg)  06/19/22 90 lb 3.2 oz (40.9 kg)   BMI Readings from Last 3 Encounters:  07/10/22 15.72 kg/m  07/04/22 16.14 kg/m  06/19/22 15.48 kg/m    Assessment/Interventions: Review of patient past medical history, allergies, medications, health status, including review of consultants reports, laboratory and other test data, was performed as part of comprehensive evaluation and provision of chronic care management services.   SDOH:  (Social Determinants of Health) assessments and interventions performed: No SDOH Interventions     Flowsheet Row Clinical Support from 12/05/2021 in [place] [facility] [place] Management from 11/12/2020 in [place] [facility] [place] Interventions Intervention Not Indicated --  Housing Interventions Intervention Not Indicated --  Transportation Interventions Intervention Not Indicated Intervention Not Indicated  Financial Strain Interventions Intervention Not Indicated Intervention Not Indicated  Physical Activity Interventions Intervention Not Indicated --  Stress Interventions Intervention Not Indicated --  Social Connections Interventions Intervention Not Indicated --      SDOH Screenings   Food Insecurity: No Food Insecurity (03/26/2022)  Housing: Low Risk  (12/05/2021)  Transportation Needs: No Transportation Needs (03/26/2022)  Alcohol Screen: Low Risk  (12/05/2021)  Depression (PHQ2-9): Low Risk  (07/10/2022)  Financial Resource Strain: Low Risk  (03/26/2022)  Physical Activity: Insufficiently Active (03/26/2022)  Social Connections: Socially Integrated (03/26/2022)  Stress: No Stress Concern Present (03/26/2022)  Tobacco Use: Low Risk  (08/02/2022)    CCM Care Plan  Allergies  Allergen Reactions   Latex Hives   Other Rash and Other (See Comments)    Adhesive pads left on skin for extended periods = BURNS AND RASHES ON THE SKIN!!!!   Codeine Phosphate Other (See Comments)    Drowsiness    Demerol [Meperidine] Nausea And Vomiting   Protonix [Pantoprazole] Rash   Sulfa Antibiotics Rash   Tens Therapy Replace Back Pads Itching and Rash    Medications Reviewed Today     Reviewed by Margarette Canada, PTA (Physical  Therapy Assistant) on 08/02/22 at [place] List Status: <None>   Medication Order Taking? Sig Documenting Provider Last Dose Status Informant  acetaminophen (TYLENOL) 500 MG tablet [id number] No Take 1,000 mg by mouth every 6 (six) hours as needed for moderate pain.  Patient not taking: Reported on  07/10/2022   [provider] Not Taking Active Self  alendronate (FOSAMAX) 70 MG tablet [id number] No TAKE 1 TABLET BY MOUTH  WEEKLY WITH A FULL GLASS OF WATER ON AN EMPTY STOMACH Philemon Kingdom, MD Taking Active   apixaban (ELIQUIS) 5 MG TABS tablet [id number] No TAKE 1 TABLET BY MOUTH TWICE  DAILY Minus Breeding, MD Taking Active   Calcium Citrate-Vitamin D (CITRACAL + D PO) [id number] No Take by mouth. 1 per day [provider] Taking Active   Cholecalciferol (VITAMIN D3) 50 MCG (2000 UT) TABS [id number] No Take by mouth. 1 per day [provider] Taking Active   Coenzyme Q10 (COQ10) 100 MG CAPS [id number] No  [provider] Taking Active   diltiazem (CARDIZEM) 30 MG tablet [id number] No TAKE 1 TABLET EVERY 4 HOURS AS NEEDED FOR BREAKTHROUGH AFIB [name], [name], PA Taking Active   diltiazem (TIAZAC) 300 MG 24 hr capsule [id number] No TAKE 1 CAPSULE BY MOUTH  DAILY Hochrein, Jeneen Rinks, MD Taking Active   dofetilide (TIKOSYN) 250 MCG capsule [id number]  TAKE 1 CAPSULE BY MOUTH 2 TIMES DAILY. Camnitz, Will Hassell Done, MD  Active   ketoconazole (NIZORAL) 2 % cream [id number]  Apply 1 Application topically daily. [name], [name], MD  Active   LORazepam (ATIVAN) 0.5 MG tablet [id number] No Take 1 tablet (0.5 mg total) by mouth daily as needed for anxiety (when flying). [name], [name], MD Taking Active   metoprolol tartrate (LOPRESSOR) 25 MG tablet [id number] No Take 1 tablet in the AM and 2 tablets in the PM [name], [name], PA Taking Active   Multiple Vitamins-Minerals (PRESERVISION AREDS PO) [id number] No Take 1 capsule by mouth in the morning and at bedtime. [provider] Taking Active Self  potassium chloride (KLOR-CON) 10 MEQ tablet [id number] No Take 2 tablets (20 mEq total) by mouth daily. Minus Breeding, MD Taking Active   pravastatin (PRAVACHOL) 20 MG tablet [id number] No Take 1 tablet (20 mg total) by mouth every evening. Minus Breeding, MD Taking Active              Patient Active Problem List   Diagnosis Date Noted   Acquired dilation of ascending aorta and aortic root ([place]) 04/11/2022   Dyslipidemia 04/11/2022   Elevated coronary artery calcium score 04/11/2022   Enlarged aorta ([place]) 04/11/2022   URI (upper respiratory infection) 11/01/2021   Intermediate stage nonexudative age-related macular degeneration of both eyes 09/06/2021   Bronchiectasis without complication ([place]) 95/32/0233   Cough 08/09/2021   Pneumonia due to COVID-19 virus 07/05/2021   Secondary hypercoagulable state ([place]) 05/31/2021   Paroxysmal A-fib ([place]) 05/31/2021   Posterior capsular opacification non visually significant of left eye 07/19/2020   Left epiretinal membrane 07/19/2020   Right epiretinal membrane 07/19/2020   Low back pain 07/14/2020   Pain in left leg 07/14/2020   Educated about COVID-19 virus infection 05/30/2020   Hypercholesterolemia 03/11/2020   Atrial fibrillation ([place]) 03/11/2020   Sensorineural hearing loss (SNHL), bilateral 03/21/2018   Tinnitus of both ears 03/21/2018   Hypertensive disorder 06/30/2017   Osteoporosis 10/19/2016   Paroxysmal atrial fibrillation ([place]) 09/27/2015   Special screening for malignant neoplasms, colon 04/16/2014   Hemorrhoids,  unspecified hemorrhoid type 04/16/2014   Foot callus 05/14/2013   Bilateral bunions 03/13/2013   Hypertrophic toenail 03/13/2013   Vision disturbance 03/13/2013   Post-nasal drainage 05/05/2012   Aortic insufficiency 01/16/2012   Macular degeneration 10/12/2011   ANXIETY, SITUATIONAL 05/08/2008   Vitamin D insufficiency 05/22/2007   ELEVATED BLOOD PRESSURE WITHOUT DIAGNOSIS OF HYPERTENSION 05/22/2007   Allergic rhinitis due to allergen 03/28/2007    Immunization History  Administered Date(s) Administered   Fluad Quad(high Dose 65+) 09/02/2019, 08/03/2021   Influenza Split 08/25/2011, 09/03/2013, 08/03/2014   Influenza Whole 08/25/2009, 07/30/2010, 07/30/2012   Influenza, High Dose  Seasonal PF 07/30/2020   Influenza,inj,Quad PF,6+ Mos 08/17/2015, 07/18/2016, 07/30/2017   Influenza-Unspecified 08/20/2018, 08/09/2019   Moderna Covid-19 Vaccine Bivalent Booster 42yrs & up 09/13/2021   PFIZER(Purple Top)SARS-COV-2 Vaccination 12/05/2019, 12/30/2019, 08/17/2020, 02/25/2021   Pneumococcal Conjugate-13 03/05/2018   Pneumococcal Polysaccharide-23 02/02/2020   Pneumococcal-Unspecified 12/02/2019   Td 05/22/2007   Zoster Recombinat (Shingrix) 03/22/2018, 07/28/2018   Zoster, Live 10/02/2013   Patient is still having issues with her cough. She reports that it gets worse when she is bending over. She inquired about taking omeprazole and loratadine with her current medications.  Patient had seen a nutritionist in Jan and Feb and this was helpful. Her cardiologist had recommended a plant based diet and she followed up with this with a nutritionist.  -rash?  -cough?  -cholesterol questions?  -Recommended taking omeprazole prior to dinner to separate from Fosamax -Recommended continued BP monitoring at home  Conditions to be addressed/monitored:  Hypertension, Atrial Fibrillation, Anxiety, Osteoporosis and Macular degeneration  Conditions addressed this visit: Hypertension, Atrial Fibrillation  Lab Results  Component Value Date   CHOL 136 06/27/2022   HDL 72 06/27/2022   LDLCALC 51 06/27/2022   LDLDIRECT 131.0 09/03/2009   TRIG 64 06/27/2022   CHOLHDL 1.9 06/27/2022    Hyperlipidemia/elevated CAC score: (LDL goal < 70) -Controlled -Current treatment: Pravastatin 20 mg 1 tablet daily  -Medications previously tried: ***  -Current dietary patterns: *** -Current exercise habits: *** -Educated on {CCM HLD Counseling:25126} -{CCMPHARMDINTERVENTION:25122}   There are no care plans that you recently modified to display for this patient.    Medication Assistance: None required.  Patient affirms current coverage meets needs.  Compliance/Adherence/Medication fill  history: Care Gaps: Tetanus, COVID booster, influenza Last BP - 106/54 on 07/10/2022   Star-Rating Drugs: Pravastatin 20 mg - last filled 06/29/2022 90 DS at [facility]  Patient's preferred pharmacy is:  [id number] - [place], [place] [address] [place] [address] Phone: [phone] Fax: [phone]  [facility]) [place], [place] [address] Phone: [phone] Fax: [phone]  [facility] ([place], [place] [facility] [address] [place] [address] [address] Phone: [phone] Fax: [place], [place] [address] Phone: [phone] Fax: [phone]   Uses pill box? Yes -  weekly  - recommended AM/PM Pt endorses 100% compliance  We discussed: Current pharmacy is preferred with insurance plan and patient is satisfied with pharmacy services Patient decided to: Continue current medication management strategy  Care Plan and Follow Up Patient Decision:  Patient agrees to Care Plan and Follow-up.  Plan: Telephone follow up appointment with care management team member scheduled for:  6 months  [facility]  Kipp Brood, PharmD [place] Pharmacist [place] [facility] [place] [phone]

## 2022-08-07 NOTE — Chronic Care Management (AMB) (Signed)
    Chronic Care Management Pharmacy Assistant   Name: [name] [name]  MRN: [id number] DOB: 02-Mar-1951  08/08/2022 APPOINTMENT REMINDER   Called [name], No answer, left message of appointment on 08/08/2022 at 8:30 via office visit with [facility], Pharm D. Notified to have all medications, supplements, blood pressure and/or blood sugar logs available during appointment and to return call if need to reschedule.  Care Gaps: AWV - scheduled 12/06/2022 Last BP - 106/54 on 07/10/2022 Tdap - overdue Covid - overdue Flu - due  Star Rating Drug: Pravastatin 20 mg - last filled 06/29/2022 90 DS at [facility]  Any gaps in medications fill history? No  [facility]  Catering manager [phone]

## 2022-08-08 ENCOUNTER — Telehealth: Payer: Self-pay

## 2022-08-08 ENCOUNTER — Ambulatory Visit: Payer: Medicare Other | Admitting: Emergency Medicine

## 2022-08-08 ENCOUNTER — Ambulatory Visit: Payer: Medicare Other

## 2022-08-08 NOTE — Telephone Encounter (Signed)
--  Caller states she is having afib and wants to know if she needs to reschedule her appt no chest pain or shortness of breath is on meds for afib states it is not unusual to have break through afib b/p is 115/84 117 HR no headache had sx on forehead for basal cell carcinoma would have to have husband drive her  55/97/4163 7:57:25 [place], RN, [place] [facility]  Comments User: Tawni Levy, RN Date/Time Eilene Ghazi Time): 08/08/2022 7:57:51 AM caller states she wants to rescedule appt and will call office vback ot do so

## 2022-08-10 DIAGNOSIS — Z1231 Encounter for screening mammogram for malignant neoplasm of breast: Secondary | ICD-10-CM | POA: Diagnosis not present

## 2022-08-10 LAB — HM MAMMOGRAPHY

## 2022-08-10 NOTE — Progress Notes (Signed)
Chronic Care Management Pharmacy Note  08/11/2022 Name:  PORSCHEA BORYS MRN:  [id number] DOB:  Jun 12, 1951  Summary: Pt reports she still has a cough  BP is mostly at goal < 140/90 LDL at goal < 70    Recommendations/Changes made from today's visit: -Recommended trial of baking soda for 1-2 weeks for cough and then consider adding famotidine or omeprazole -Recommended considering sooner follow up with cardiology if she continues to have frequent Afib episodes   Plan: Follow up BP assessment in 3 months Follow up in 6 months  Subjective: TATIANA COURTER is an 71 y.o. year old female who is a primary patient of [name], [name], MD.  The CCM team was consulted for assistance with disease management and care coordination needs.    Engaged with patient by telephone for follow up visit in response to provider referral for pharmacy case management and/or care coordination services.   Consent to Services:  The patient was given information about Chronic Care Management services, agreed to services, and gave verbal consent prior to initiation of services.  Please see initial visit note for detailed documentation.   Patient Care Team: [name], [name], MD as PCP - General (Internal Medicine) Minus Breeding, MD as PCP - Cardiology (Cardiology) Thompson Grayer, MD as PCP - Electrophysiology (Cardiology) Clent Jacks, MD (Ophthalmology) Minus Breeding, MD as Consulting Physician (Cardiology) Viona Gilmore, [facility] as Pharmacist (Pharmacist)  Recent office visits: 07/10/22 Shanon Ace, MD: Patient presented for rash follow up.  Refilled ketoconazole cream.  07/04/22 Shanon Ace, MD: Patient presented for rash follow up. Follow up in person in 1-2 weeks.  06/19/22 Shanon Ace, MD: Patient presented for rash on thighs. Follow up in 2 weeks.  06/15/22 Grier Mitts, MD: Patient presented for a rash. Prescribed ketoconazole cream daily.   06/07/22 Alysia Penna, MD: Patient presented for a rash.    03/28/2022 Alysia Penna MD - Patient was seen for right thigh pain. Referral to Sport Medicine. No medication changes. No follow up noted.   Recent consult visits: 08/02/22 Margarette Canada, PTA (outpatient rehab): Patient presented for PT treatment for right leg.  06/12/22 Glennon Mac, DO (sports medicine): Patient presented for strain of right quadriceps. Follow up as needed.  05/04/2022 Glennon Mac DO (sport med) - Patient was seen for Strain of right quadriceps, initial encounter. Referral to Physical Therapy. No medication changes. Follow up in 4 weeks.    04/17/2022 Barrington Ellison PA-C (heartcare) - Patient was seen for PAF (paroxysmal atrial fibrillation) and additional issues. Discontinued Calcium + D3. Follow up in 6 months.    04/12/2022 Minus Breeding MD (cardiology) - Patient was seen for Nonrheumatic aortic valve insufficiency and additional issues. Started Pravastatin 20 mg. Follow up in 6 months.    04/06/2022 Glennon Mac DO (sport med) - Patient was seen for  Right hip pain and an additional issue. No medication changes. Follow up in 4 weeks.   01/31/22 Baltazar Apo, MD (pulmonary): Patient presented for chronic cough follow up. Prescribed omeprazole 20 mg daily and loratadine 10 mg daily for possible silent GERD and contribution to chronic allergies or congestion.  09/29/2021 Philemon Kingdom MD (endocrinology) - Patient was seen for age related osteoporosis and an additional issue. No medication changes. Follow up in 1 year.  Hospital visits: None in previous 6 months  Objective:  Lab Results  Component Value Date   CREATININE 0.70 04/17/2022   BUN 19 04/17/2022   GFR 88.98 01/11/2022   GFRNONAA >60 06/24/2021  GFRAA 86 09/28/2020   NA 141 04/17/2022   K 4.4 04/17/2022   CALCIUM 10.2 04/17/2022   CO2 28 04/17/2022   GLUCOSE 70 04/17/2022    Lab Results  Component Value Date/Time   GFR 88.98 01/11/2022 10:45 AM   GFR 84.89 05/17/2018 09:12 AM     Last diabetic Eye exam:  Lab Results  Component Value Date/Time   HMDIABEYEEXA Normal done by Dr.Groat 12/07/2011 12:00 AM    Last diabetic Foot exam: No results found for: "HMDIABFOOTEX"   Lab Results  Component Value Date   CHOL 136 06/27/2022   HDL 72 06/27/2022   LDLCALC 51 06/27/2022   LDLDIRECT 131.0 09/03/2009   TRIG 64 06/27/2022   CHOLHDL 1.9 06/27/2022       Latest Ref Rng & Units 01/11/2022   10:45 AM 06/24/2021   10:54 AM 05/17/2018    9:12 AM  Hepatic Function  Total Protein 6.0 - 8.3 g/dL 8.2  6.6  7.4   Albumin 3.5 - 5.2 g/dL 4.1  2.8  4.5   AST 0 - 37 U/L '19  26  21   ' ALT 0 - 35 U/L '14  18  23   ' Alk Phosphatase 39 - 117 U/L 71  67  37   Total Bilirubin 0.2 - 1.2 mg/dL 0.5  1.2  0.7   Bilirubin, Direct 0.0 - 0.3 mg/dL 0.0   0.1     Lab Results  Component Value Date/Time   TSH 1.17 01/11/2022 10:45 AM   TSH 0.891 06/24/2021 12:32 PM   TSH 0.82 09/28/2020 08:22 AM   FREET4 1.00 12/08/2011 08:05 AM       Latest Ref Rng & Units 01/11/2022   10:45 AM 06/24/2021   10:54 AM 01/10/2021    7:55 AM  CBC  WBC 4.0 - 10.5 K/uL 7.6  20.5  6.0   Hemoglobin 12.0 - 15.0 g/dL 13.3  13.8  13.7   Hematocrit 36.0 - 46.0 % 40.4  41.3  40.6   Platelets 150.0 - 400.0 K/uL 312.0  303  226     Lab Results  Component Value Date/Time   VD25OH 42.31 09/29/2021 08:34 AM   VD25OH 44.3 09/28/2020 08:22 AM   VD25OH 57.64 11/20/2019 08:06 AM    Clinical ASCVD: No  The 10-year ASCVD risk score (Arnett DK, et al., 2019) is: 6%   Values used to calculate the score:     Age: 71 years     Sex: Female     Is Non-Hispanic African American: No     Diabetic: No     Tobacco smoker: No     Systolic Blood Pressure: 458 mmHg     Is BP treated: Yes     HDL Cholesterol: 72 mg/dL     Total Cholesterol: 136 mg/dL       07/10/2022    4:30 PM 06/19/2022   11:54 AM 06/15/2022   11:10 AM  Depression screen PHQ 2/9  Decreased Interest 0 0 0  Down, Depressed, Hopeless 0 0 0  PHQ - 2  Score 0 0 0  Altered sleeping 0 0 0  Tired, decreased energy 0 0 0  Change in appetite 0 0 0  Feeling bad or failure about yourself  0 0 0  Trouble concentrating 0 0 0  Moving slowly or fidgety/restless 0 0 0  Suicidal thoughts 0 0 0  PHQ-9 Score 0 0 0  Difficult doing work/chores Not difficult at all Not difficult at all  CHA2DS2/VAS Stroke Risk Points  Current as of 4 hours ago     3 >= 2 Points: High Risk  1 - 1.99 Points: Medium Risk  0 Points: Low Risk    Last Change: N/A      Details    This score determines the patient's risk of having a stroke if the  patient has atrial fibrillation.       Points Metrics  0 Has Congestive Heart Failure:  No    Current as of 4 hours ago  0 Has Vascular Disease:  No    Current as of 4 hours ago  1 Has Hypertension:  Yes    Current as of 4 hours ago  1 Age:  71    Current as of 4 hours ago  0 Has Diabetes:  No    Current as of 4 hours ago  0 Had Stroke:  No  Had TIA:  No  Had Thromboembolism:  No    Current as of 4 hours ago  1 Female:  Yes    Current as of 4 hours ago     Social History   Tobacco Use  Smoking Status Never  Smokeless Tobacco Never   BP Readings from Last 3 Encounters:  08/11/22 102/60  07/10/22 (!) 106/54  07/04/22 101/62   Pulse Readings from Last 3 Encounters:  07/10/22 60  07/04/22 (!) 58  06/19/22 65   Wt Readings from Last 3 Encounters:  07/10/22 91 lb 9.6 oz (41.5 kg)  07/04/22 94 lb (42.6 kg)  06/19/22 90 lb 3.2 oz (40.9 kg)   BMI Readings from Last 3 Encounters:  07/10/22 15.72 kg/m  07/04/22 16.14 kg/m  06/19/22 15.48 kg/m    Assessment/Interventions: Review of patient past medical history, allergies, medications, health status, including review of consultants reports, laboratory and other test data, was performed as part of comprehensive evaluation and provision of chronic care management services.   SDOH:  (Social Determinants of Health) assessments and interventions  performed: Yes SDOH Interventions    Flowsheet Row Chronic Care Management from 08/11/2022 in [place] at [place] from 12/05/2021 in [place] at [place] Management from 11/12/2020 in [place] at [place] Interventions -- Intervention Not Indicated --  Housing Interventions -- Intervention Not Indicated --  Transportation Interventions -- Intervention Not Indicated Intervention Not Indicated  Financial Strain Interventions Intervention Not Indicated Intervention Not Indicated Intervention Not Indicated  Physical Activity Interventions -- Intervention Not Indicated --  Stress Interventions -- Intervention Not Indicated --  Social Connections Interventions -- Intervention Not Indicated --      SDOH Screenings   Food Insecurity: No Food Insecurity (03/26/2022)  Housing: Low Risk  (12/05/2021)  Transportation Needs: No Transportation Needs (03/26/2022)  Alcohol Screen: Low Risk  (12/05/2021)  Depression (PHQ2-9): Low Risk  (07/10/2022)  Financial Resource Strain: Low Risk  (08/11/2022)  Physical Activity: Insufficiently Active (03/26/2022)  Social Connections: Socially Integrated (03/26/2022)  Stress: No Stress Concern Present (03/26/2022)  Tobacco Use: Low Risk  (08/02/2022)    CCM Care Plan  Allergies  Allergen Reactions   Latex Hives   Other Rash and Other (See Comments)    Adhesive pads left on skin for extended periods = BURNS AND RASHES ON THE SKIN!!!!   Codeine Phosphate Other (See Comments)    Drowsiness    Demerol [Meperidine] Nausea And Vomiting   Protonix [Pantoprazole] Rash   Sulfa Antibiotics Rash  Tens Therapy Replace Back Pads Itching and Rash    Medications Reviewed Today     Reviewed by Margarette Canada, PTA (Physical Therapy Assistant) on 08/02/22 at [place] List Status: <None>   Medication Order Taking? Sig Documenting Provider Last Dose Status Informant   acetaminophen (TYLENOL) 500 MG tablet [id number] No Take 1,000 mg by mouth every 6 (six) hours as needed for moderate pain.  Patient not taking: Reported on 07/10/2022   [provider] Not Taking Active Self  alendronate (FOSAMAX) 70 MG tablet [id number] No TAKE 1 TABLET BY MOUTH  WEEKLY WITH A FULL GLASS OF WATER ON AN EMPTY STOMACH Philemon Kingdom, MD Taking Active   apixaban (ELIQUIS) 5 MG TABS tablet [id number] No TAKE 1 TABLET BY MOUTH TWICE  DAILY Minus Breeding, MD Taking Active   Calcium Citrate-Vitamin D (CITRACAL + D PO) [id number] No Take by mouth. 1 per day [provider] Taking Active   Cholecalciferol (VITAMIN D3) 50 MCG (2000 UT) TABS [id number] No Take by mouth. 1 per day [provider] Taking Active   Coenzyme Q10 (COQ10) 100 MG CAPS [id number] No  [provider] Taking Active   diltiazem (CARDIZEM) 30 MG tablet [id number] No TAKE 1 TABLET EVERY 4 HOURS AS NEEDED FOR BREAKTHROUGH AFIB [name], [name], PA Taking Active   diltiazem (TIAZAC) 300 MG 24 hr capsule [id number] No TAKE 1 CAPSULE BY MOUTH  DAILY Hochrein, Jeneen Rinks, MD Taking Active   dofetilide (TIKOSYN) 250 MCG capsule [id number]  TAKE 1 CAPSULE BY MOUTH 2 TIMES DAILY. Camnitz, Will Hassell Done, MD  Active   ketoconazole (NIZORAL) 2 % cream [id number]  Apply 1 Application topically daily. [name], [name], MD  Active   LORazepam (ATIVAN) 0.5 MG tablet [id number] No Take 1 tablet (0.5 mg total) by mouth daily as needed for anxiety (when flying). [name], [name], MD Taking Active   metoprolol tartrate (LOPRESSOR) 25 MG tablet [id number] No Take 1 tablet in the AM and 2 tablets in the PM [name], [name], PA Taking Active   Multiple Vitamins-Minerals (PRESERVISION AREDS PO) [id number] No Take 1 capsule by mouth in the morning and at bedtime. [provider] Taking Active Self  potassium chloride (KLOR-CON) 10 MEQ tablet [id number] No Take 2 tablets (20 mEq total) by mouth daily. Minus Breeding, MD  Taking Active   pravastatin (PRAVACHOL) 20 MG tablet [id number] No Take 1 tablet (20 mg total) by mouth every evening. Minus Breeding, MD Taking Active             Patient Active Problem List   Diagnosis Date Noted   Acquired dilation of ascending aorta and aortic root ([place]) 04/11/2022   Dyslipidemia 04/11/2022   Elevated coronary artery calcium score 04/11/2022   Enlarged aorta ([place]) 04/11/2022   URI (upper respiratory infection) 11/01/2021   Intermediate stage nonexudative age-related macular degeneration of both eyes 09/06/2021   Bronchiectasis without complication ([place]) 04/24/9484   Cough 08/09/2021   Pneumonia due to COVID-19 virus 07/05/2021   Secondary hypercoagulable state ([place]) 05/31/2021   Paroxysmal A-fib ([place]) 05/31/2021   Posterior capsular opacification non visually significant of left eye 07/19/2020   Left epiretinal membrane 07/19/2020   Right epiretinal membrane 07/19/2020   Low back pain 07/14/2020   Pain in left leg 07/14/2020   Educated about COVID-19 virus infection 05/30/2020   Hypercholesterolemia 03/11/2020   Atrial fibrillation ([place]) 03/11/2020   Sensorineural hearing loss (SNHL), bilateral 03/21/2018   Tinnitus of both ears 03/21/2018   Hypertensive disorder  06/30/2017   Osteoporosis 10/19/2016   Paroxysmal atrial fibrillation ([place]) 09/27/2015   Special screening for malignant neoplasms, colon 04/16/2014   Hemorrhoids, unspecified hemorrhoid type 04/16/2014   Foot callus 05/14/2013   Bilateral bunions 03/13/2013   Hypertrophic toenail 03/13/2013   Vision disturbance 03/13/2013   Post-nasal drainage 05/05/2012   Aortic insufficiency 01/16/2012   Macular degeneration 10/12/2011   ANXIETY, SITUATIONAL 05/08/2008   Vitamin D insufficiency 05/22/2007   ELEVATED BLOOD PRESSURE WITHOUT DIAGNOSIS OF HYPERTENSION 05/22/2007   Allergic rhinitis due to allergen 03/28/2007    Immunization History  Administered Date(s) Administered   Fluad Quad(high  Dose 65+) 09/02/2019, 08/03/2021   Influenza Split 08/25/2011, 09/03/2013, 08/03/2014   Influenza Whole 08/25/2009, 07/30/2010, 07/30/2012   Influenza, High Dose Seasonal PF 07/30/2020   Influenza,inj,Quad PF,6+ Mos 08/17/2015, 07/18/2016, 07/30/2017   Influenza-Unspecified 08/20/2018, 08/09/2019   Moderna Covid-19 Vaccine Bivalent Booster 74yr & up 09/13/2021   PFIZER(Purple Top)SARS-COV-2 Vaccination 12/05/2019, 12/30/2019, 08/17/2020, 02/25/2021   Pneumococcal Conjugate-13 03/05/2018   Pneumococcal Polysaccharide-23 02/02/2020   Pneumococcal-Unspecified 12/02/2019   Td 05/22/2007   Zoster Recombinat (Shingrix) 03/22/2018, 07/28/2018   Zoster, Live 10/02/2013   Patient reports one of her biggest concerns has been losing 20 lbs over last year and she thinks this was muscle loss.  Patient is also still dealing with this cough. She reported omeprazole helped in the past but she wanted to stop because the 14 days recommended on the box. Discussed how the biggest risk for her for long term use of omeprazole PPIs) would be the increased risk for fracture but this is for years of use, not a few months. Her nutritionist  recommended baking soda at bedtime to see if this helps and she hasn't tried it yet as she wasn't sure if this would impact her meds. Recommended going ahead with the baking soda for 1-2 weeks and if no relief, try Pepcid or omeprazole prior to seeing pulmonologist for cough.  Patient reports she wasn't sure what she was doing with her calcium intake or if she was getting enough. We discussed getting 1200 mg between diet and supplement. She is not consistent with dairy intake but usually has 1/2 cup with cereal almost every day and some cheese. If patient is not going to consume cheese on some days recommended 1 full glass of milk to get around 300 mg of calcium.  Patient also brought her BP cuff into the office and this was compared with the manual cuff. Her cuff gave a reading of  105/64 for her right arm and the manual was 102/60.  Patient reports she is tolerating the pravastatin well so far and hasn't noticed side effects. Discussed benefits of statin for reducing plaque build up and cholesterol lowering.  Conditions to be addressed/monitored:  Hypertension, Hyperlipidemia, Atrial Fibrillation, Anxiety, Osteoporosis, and Macular degeneration  Conditions addressed this visit: Hypertension, Atrial Fibrillation, hyperlipidemia  Care Plan : CCM Pharmacy Care Plan  Updates made by PViona Gilmore ROrchardsince 08/11/2022 12:00 AM     Problem: Problem: Hypertension, Atrial Fibrillation, Anxiety, Osteoporosis and Macular degeneration      Long-Range Goal: Patient-Specific Goal   Start Date: 02/11/2021  Expected End Date: 02/11/2022  Recent Progress: On track  Priority: High  Note:   Current Barriers:  Unable to maintain control of Afib  Pharmacist Clinical Goal(s):  Patient will achieve adherence to monitoring guidelines and medication adherence to achieve therapeutic efficacy through collaboration with PharmD and provider.   Interventions: 1:1 collaboration with PShanon Ace  K, MD regarding development and update of comprehensive plan of care as evidenced by provider attestation and co-signature Inter-disciplinary care team collaboration (see longitudinal plan of care) Comprehensive medication review performed; medication list updated in electronic medical record  Hypertension (BP goal <140/90) -Controlled -Current treatment: Diltiazem 300 mg 1 capsule daily - Appropriate, Effective, Safe, Accessible Metoprolol tartrate 25 mg 1 tablet twice daily - Appropriate, Effective, Safe, Accessible -Medications previously tried: n/a  -Current home readings: 117/80, 114/80, 114/64, 110/63, 114/69, 122/36 -BP fluctuates some when in Afib; brought arm BP cuff and it was only off by 2 mmHg/4 mmHg) -Current dietary habits: limits salt intake; reads all package  labels -Current exercise habits: [employer] in [place] classes and walking  -Denies hypotensive/hypertensive symptoms -Educated on Exercise goal of 150 minutes per week; Importance of home blood pressure monitoring; -Counseled to monitor BP at home at least weekly, document, and provide log at future appointments -Counseled on diet and exercise extensively Recommended to continue current medication  Aortic atherosclerosis: (LDL goal < 70) -Controlled -Current treatment: Pravastatin 1 tablet daily - Appropriate, Effective, Safe, Accessible -Medications previously tried: none  -Current dietary patterns: patient is mostly eating a plant based diet -Current exercise habits: some  -Educated on Cholesterol goals;  Benefits of statin for ASCVD risk reduction; -Counseled on diet and exercise extensively Recommended to continue current medication  Atrial Fibrillation (Goal: prevent stroke and major bleeding) -Not ideally controlled -CHADSVASC: 3 -Current treatment: Rate/rhythm control: Tikosyn 250 mcg 1 tablet twice daily; Metoprolol 25 mg twice daily - Appropriate, Effective, Safe, Accessible Anticoagulation: Eliquis 5 mg 1 tablet twice daily  - Appropriate, Effective, Safe, Accessible -Medications previously tried: none -Home BP and HR readings: 70-90s -Counseled on increased risk of stroke due to Afib and benefits of anticoagulation for stroke prevention; -Recommended to continue current medication  Anxiety for flying (Goal: minimize symptoms) -Controlled -Current treatment: Lorazepam 0.5 mg 1 tablet as needed - Appropriate, Effective, Safe, Accessible -Medications previously tried/failed: none -GAD7: n/a -Educated on Benefits of medication for symptom control -Recommended to continue current medication  Osteoporosis (Goal prevent fractures) -Controlled -Last DEXA Scan: 08/2020              T-Score femoral neck: LFN -2.3, RFN -2.5             T-Score total hip: n/a             T-Score  lumbar spine: -1.9             T-Score forearm radius: n/a             10-year probability of major osteoporotic fracture: n/a             10-year probability of hip fracture: n/a -Patient is a candidate for pharmacologic treatment due to T-Score < -2.5 in femoral neck -Current treatment  Fosamax 70 mg 1 tablet weekly - Saturday - Appropriate, Effective, Safe, Accessible Calcium-vitamin D 600 mg- 2500 units daily - Appropriate, Effective, Safe, Accessible -Medications previously tried: none  -Recommend [phone] units of vitamin D daily. Recommend 1200 mg of calcium daily from dietary and supplemental sources. Counseled on oral bisphosphonate administration: take in the morning, 30 minutes prior to food with 6-8 oz of water. Do not lie down for at least 30 minutes after taking. Recommend weight-bearing and muscle strengthening exercises for building and maintaining bone density. -Recommended to continue current medication  Macular degeneration (Goal: maintain healthy eyes) -Controlled -Current treatment  Preservision areds 1 tablet twice daily -  Appropriate, Effective, Safe, Accessible Eysuvis 0.25% apply 1 drop three times daily - Appropriate, Effective, Safe, Accessible -Medications previously tried: none  -Recommended to continue current medication  Health Maintenance -Vaccine gaps: tetanus (patient reported getting at pharmacy a couple years ago - unable to confirm with [facility]) -Current therapy:  Tylenol 500 mg 1 tablet as needed -Educated on Cost vs benefit of each product must be carefully weighed by individual consumer -Patient is satisfied with current therapy and denies issues -Recommended to continue current medication  Patient Goals/Self-Care Activities Patient will:  - check blood pressure at least weekly, document, and provide at future appointments target a minimum of 150 minutes of moderate intensity exercise weekly  Follow Up Plan: Telephone follow up appointment with  care management team member scheduled for: 6 months       Medication Assistance: None required.  Patient affirms current coverage meets needs.  Compliance/Adherence/Medication fill history: Care Gaps: Tetanus, COVID booster, influenza Last BP - 106/54 on 07/10/2022   Star-Rating Drugs: Pravastatin 20 mg - last filled 06/29/2022 90 DS at [facility]  Patient's preferred pharmacy is:  [id number] [place] [address]Phone: [phone]: [phone] [address]Phone: [phone]: [phone] [facility] ([address] [address]Phone: [phone]: [address] [place]Phone: [phone]: [phone]  Uses pill box? Yes -  weekly  - recommended AM/PM Pt endorses 100% compliance  We discussed: Current pharmacy is preferred with insurance plan and patient is satisfied with pharmacy services Patient decided to: Continue current medication management strategy  Care Plan and Follow Up Patient Decision:  Patient agrees to Care Plan and Follow-up.  Plan: Telephone follow up appointment with care management team member scheduled for:  6 months  MJeni Salles PharmD BWhite OakPharmacist LVerona Walkat BOak [phone]

## 2022-08-11 ENCOUNTER — Ambulatory Visit (INDEPENDENT_AMBULATORY_CARE_PROVIDER_SITE_OTHER): Payer: Medicare Other | Admitting: Pharmacist

## 2022-08-11 VITALS — BP 102/60

## 2022-08-11 DIAGNOSIS — I480 Paroxysmal atrial fibrillation: Secondary | ICD-10-CM

## 2022-08-11 DIAGNOSIS — I10 Essential (primary) hypertension: Secondary | ICD-10-CM

## 2022-08-11 NOTE — Patient Instructions (Signed)
Hi [name] [name],  It was great to get to meet you in person for the first time! I hope my note gives enough details even though the after visit summary doesn't have as many.  Please reach out to me if you have any questions or need anything before our follow up!  Best, Maddie  [name], PharmD, [facility]   Visit Information   Goals Addressed   None    Patient Care Plan: CCM Pharmacy Care Plan     Problem Identified: Problem: Hypertension, Atrial Fibrillation, Anxiety, Osteoporosis and Macular degeneration      Long-Range Goal: Patient-Specific Goal   Start Date: 02/11/2021  Expected End Date: 02/11/2022  Recent Progress: On track  Priority: High  Note:   Current Barriers:  Unable to maintain control of Afib  Pharmacist Clinical Goal(s):  Patient will achieve adherence to monitoring guidelines and medication adherence to achieve therapeutic efficacy through collaboration with PharmD and provider.   Interventions: 1:1 collaboration with [name], [name], MD regarding development and update of comprehensive plan of care as evidenced by provider attestation and co-signature Inter-disciplinary care team collaboration (see longitudinal plan of care) Comprehensive medication review performed; medication list updated in electronic medical record  Hypertension (BP goal <140/90) -Controlled -Current treatment: Diltiazem 300 mg 1 capsule daily - Appropriate, Effective, Safe, Accessible Metoprolol tartrate 25 mg 1 tablet twice daily - Appropriate, Effective, Safe, Accessible -Medications previously tried: n/a  -Current home readings: 117/80, 114/80, 114/64, 110/63, 114/69, 122/36 -BP fluctuates some when in Afib; brought arm BP cuff and it was only off by 2 mmHg/4 mmHg) -Current dietary habits: limits salt intake; reads all package labels -Current exercise habits: [employer] in [place] classes and walking  -Denies  hypotensive/hypertensive symptoms -Educated on Exercise goal of 150 minutes per week; Importance of home blood pressure monitoring; -Counseled to monitor BP at home at least weekly, document, and provide log at future appointments -Counseled on diet and exercise extensively Recommended to continue current medication  Aortic atherosclerosis: (LDL goal < 70) -Controlled -Current treatment: Pravastatin 1 tablet daily - Appropriate, Effective, Safe, Accessible -Medications previously tried: none  -Current dietary patterns: patient is mostly eating a plant based diet -Current exercise habits: some  -Educated on Cholesterol goals;  Benefits of statin for ASCVD risk reduction; -Counseled on diet and exercise extensively Recommended to continue current medication  Atrial Fibrillation (Goal: prevent stroke and major bleeding) -Not ideally controlled -CHADSVASC: 3 -Current treatment: Rate/rhythm control: Tikosyn 250 mcg 1 tablet twice daily; Metoprolol 25 mg twice daily - Appropriate, Effective, Safe, Accessible Anticoagulation: Eliquis 5 mg 1 tablet twice daily  - Appropriate, Effective, Safe, Accessible -Medications previously tried: none -Home BP and HR readings: 70-90s -Counseled on increased risk of stroke due to Afib and benefits of anticoagulation for stroke prevention; -Recommended to continue current medication  Anxiety for flying (Goal: minimize symptoms) -Controlled -Current treatment: Lorazepam 0.5 mg 1 tablet as needed - Appropriate, Effective, Safe, Accessible -Medications previously tried/failed: none -GAD7: n/a -Educated on Benefits of medication for symptom control -Recommended to continue current medication  Osteoporosis (Goal prevent fractures) -Controlled -Last DEXA Scan: 08/2020              T-Score femoral neck: LFN -2.3, RFN -2.5             T-Score total hip: n/a             T-Score lumbar spine: -1.9  T-Score forearm radius: n/a              10-year probability of major osteoporotic fracture: n/a             10-year probability of hip fracture: n/a -Patient is a candidate for pharmacologic treatment due to T-Score < -2.5 in femoral neck -Current treatment  Fosamax 70 mg 1 tablet weekly - Saturday - Appropriate, Effective, Safe, Accessible Calcium-vitamin D 600 mg- 2500 units daily - Appropriate, Effective, Safe, Accessible -Medications previously tried: none  -Recommend [phone] units of vitamin D daily. Recommend 1200 mg of calcium daily from dietary and supplemental sources. Counseled on oral bisphosphonate administration: take in the morning, 30 minutes prior to food with 6-8 oz of water. Do not lie down for at least 30 minutes after taking. Recommend weight-bearing and muscle strengthening exercises for building and maintaining bone density. -Recommended to continue current medication  Macular degeneration (Goal: maintain healthy eyes) -Controlled -Current treatment  Preservision areds 1 tablet twice daily - Appropriate, Effective, Safe, Accessible Eysuvis 0.25% apply 1 drop three times daily - Appropriate, Effective, Safe, Accessible -Medications previously tried: none  -Recommended to continue current medication  Health Maintenance -Vaccine gaps: tetanus (patient reported getting at pharmacy a couple years ago - unable to confirm with [facility]) -Current therapy:  Tylenol 500 mg 1 tablet as needed -Educated on Cost vs benefit of each product must be carefully weighed by individual consumer -Patient is satisfied with current therapy and denies issues -Recommended to continue current medication  Patient Goals/Self-Care Activities Patient will:  - check blood pressure at least weekly, document, and provide at future appointments target a minimum of 150 minutes of moderate intensity exercise weekly  Follow Up Plan: Telephone follow up appointment with care management team member scheduled for: 6 months       Patient  verbalizes understanding of instructions and care plan provided today and agrees to view in [facility]. Active MyChart status and patient understanding of how to access instructions and care plan via MyChart confirmed with patient.    Face to Face appointment with pharmacist scheduled for:  6 months  Viona Gilmore, [facility]

## 2022-08-15 ENCOUNTER — Ambulatory Visit: Payer: Medicare Other

## 2022-08-15 ENCOUNTER — Encounter: Payer: Self-pay | Admitting: Internal Medicine

## 2022-08-15 DIAGNOSIS — R262 Difficulty in walking, not elsewhere classified: Secondary | ICD-10-CM

## 2022-08-15 DIAGNOSIS — M6281 Muscle weakness (generalized): Secondary | ICD-10-CM | POA: Diagnosis not present

## 2022-08-15 DIAGNOSIS — M79604 Pain in right leg: Secondary | ICD-10-CM

## 2022-08-15 NOTE — Therapy (Signed)
OUTPATIENT PHYSICAL THERAPY TREATMENT NOTE/ DISCHARGE SUMMARY   Patient Name: [name] [name] MRN: [id number] DOB:[birth year], 71 y.o., female Today's Date: 08/15/2022  PCP: Burnis Medin, MD REFERRING PROVIDER: Glennon Mac, DO  END OF SESSION:   PT End of Session - 08/15/22 1048     Visit Number 11    Number of Visits 12    Date for PT Re-Evaluation 08/15/22    Authorization Type UHC MCR    Authorization Time Period FOTO v6, v10, kx mod v15    PT Start Time 7026    PT Stop Time 1120    PT Time Calculation (min) 40 min    Activity Tolerance Patient tolerated treatment well    Behavior During Therapy WFL for tasks assessed/performed                Past Medical History:  Diagnosis Date   Allergic rhinitis    Aortic insufficiency    CAT SCRATCH 09/10/2009   Qualifier: Diagnosis of  By: Regis Bill MD, [name]    Clavicle fracture 12/2006   left   Diverticulosis    HTN (hypertension)    Osteopenia    Paroxysmal atrial fibrillation (HCC)    Pneumonia    PONV (postoperative nausea and vomiting)    Vision disturbance 03/13/2013   Negative neuro MRA MRI   Past Surgical History:  Procedure Laterality Date   ABDOMINAL HYSTERECTOMY     pt denies 04/15/14   APPENDECTOMY     ATRIAL FIBRILLATION ABLATION N/A 02/01/2021   Procedure: ATRIAL FIBRILLATION ABLATION;  Surgeon: Thompson Grayer, MD;  Location: [facility];  Service: Cardiovascular;  Laterality: N/A;   BRONCHIAL WASHINGS  09/05/2021   Procedure: BRONCHIAL WASHINGS;  Surgeon: Collene Gobble, MD;  Location: [facility];  Service: Cardiopulmonary;;   LAPAROSCOPIC SALPINGO OOPHERECTOMY Right    TONSILLECTOMY AND ADENOIDECTOMY     VIDEO BRONCHOSCOPY N/A 09/05/2021   Procedure: VIDEO BRONCHOSCOPY WITHOUT FLUORO;  Surgeon: Collene Gobble, MD;  Location: [facility];  Service: Cardiopulmonary;  Laterality: N/A;   Patient Active Problem List   Diagnosis Date Noted   Acquired dilation of ascending aorta and  aortic root ([place]) 04/11/2022   Dyslipidemia 04/11/2022   Elevated coronary artery calcium score 04/11/2022   Enlarged aorta ([place]) 04/11/2022   URI (upper respiratory infection) 11/01/2021   Intermediate stage nonexudative age-related macular degeneration of both eyes 09/06/2021   Bronchiectasis without complication ([place]) 37/85/8850   Cough 08/09/2021   Pneumonia due to COVID-19 virus 07/05/2021   Secondary hypercoagulable state ([place]) 05/31/2021   Paroxysmal A-fib ([place]) 05/31/2021   Posterior capsular opacification non visually significant of left eye 07/19/2020   Left epiretinal membrane 07/19/2020   Right epiretinal membrane 07/19/2020   Low back pain 07/14/2020   Pain in left leg 07/14/2020   Educated about COVID-19 virus infection 05/30/2020   Hypercholesterolemia 03/11/2020   Atrial fibrillation ([place]) 03/11/2020   Sensorineural hearing loss (SNHL), bilateral 03/21/2018   Tinnitus of both ears 03/21/2018   Hypertensive disorder 06/30/2017   Osteoporosis 10/19/2016   Paroxysmal atrial fibrillation ([place]) 09/27/2015   Special screening for malignant neoplasms, colon 04/16/2014   Hemorrhoids, unspecified hemorrhoid type 04/16/2014   Foot callus 05/14/2013   Bilateral bunions 03/13/2013   Hypertrophic toenail 03/13/2013   Vision disturbance 03/13/2013   Post-nasal drainage 05/05/2012   Aortic insufficiency 01/16/2012   Macular degeneration 10/12/2011   ANXIETY, SITUATIONAL 05/08/2008   Vitamin D insufficiency 05/22/2007   ELEVATED BLOOD PRESSURE WITHOUT DIAGNOSIS OF  HYPERTENSION 05/22/2007   Allergic rhinitis due to allergen 03/28/2007    REFERRING DIAG: P23.300T (ICD-10-CM) - Strain of right quadriceps, initial encounter  THERAPY DIAG:  Pain in right leg  Muscle weakness (generalized)  Difficulty in walking, not elsewhere classified  Rationale for Evaluation and Treatment Rehabilitation  PERTINENT HISTORY: Osteopenia (borderline osteoporosis), aortic insufficiency, A  fib, HTN  SUBJECTIVE:  Pt reports adherence to her HEP and that PT has helped a lot with her symptoms. She reports readiness for discharge from PT today.   PAIN:  Are you having pain? Yes: NPRS scale: 1/10 Pain location: Rt anterior thigh Pain description: Tight Aggravating factors: walking > 5 minutes, prolonged standing > 5 minutes Relieving factors: acetaminophen (used sparingly), massage   OBJECTIVE: (objective measures completed at initial evaluation unless otherwise dated)   DIAGNOSTIC FINDINGS: N/A   PATIENT SURVEYS:  FOTO 61%, predicted 73% in 12 visits  06/28/2022: 67%  07/11/2022: 65%  08/15/2022: 70%   COGNITION:           Overall cognitive status: Within functional limits for tasks assessed                          SENSATION: Not tested     MUSCLE LENGTH: Hamstring 90/90 test: 50 degrees shy of full extension BIL Ely's test: Severely limited and painful BIL Ober's test: Severe limitation on Rt   POSTURE: weight shift left   PALPATION: TTP throughout Rt IT band, adductors   LOWER EXTREMITY ROM:   A/PROM Right eval Left eval  Hip abduction 40d, tightness    Hip adduction      Knee flexion WNL WNL  Knee extension WNL WNL   (Blank rows = not tested)   LOWER EXTREMITY MMT:   MMT Right eval Left eval Right 06/28/2022 Left 06/28/2022 Right 07/11/2022 Left 07/11/2022 Right 08/15/2022 Left 08/15/2022  Hip flexion 3+/5 3+/5 4+/5 4+/5 5/5 5/5    Hip extension 3/5 3/5 3+/5 3+/5 4/5 4/5 4+/5 4+/5  Hip abduction 3/5 3/5 5/5 5/5      Hip adduction 3/5 3/5 4+/5 4+/5 5/5 5/5    Hip internal rotation 4/5 4/5 4/5 4+/5 5/5 5/5    Hip external rotation 4/5 4/5 3+/5 4+/5 4/5 4+/5 4+/5 5/5  Knee flexion 4/5 4/5 5/5 5/5      Knee extension 4/5 4/5 5/5 5/5       (Blank rows = not tested)     FUNCTIONAL TESTS:  5xSTS: 15 seconds Squat: WNL SLS: WNL BIL, (+) Trendelenburg Lunge: Increased tension through Rt quad with Rt leg back  07/11/2022: 5xSTS: 12  seconds    GAIT: Distance walked: 20 ft Assistive device utilized: None Level of assistance: Complete Independence Comments: Decreased gait speed, mild Rt antalgic gait       TODAY'S TREATMENT:  [facility] Adult PT Treatment:                                                DATE: 08/15/2022 Therapeutic Exercise: Straight-leg deadlift with two 4# dumbbells 3x10 Kickstand Pallof press with blue band 2x8 with 5-sec hold BIL Manual Therapy: N/A Neuromuscular re-ed: N/A Therapeutic Activity: Re-administration of FOTO with pt education Re-assessment of objective measures with pt education Update to HEP with pt education Modalities: N/A Self Care: N/A   [facility] Adult PT Treatment:  DATE: 08/02/22 Therapeutic Exercise: Supine 90/90 mini bicycles with handhold resistance through thighs 2x30", 1x20" Omega knee extension 10# 2x10 Omega knee flexion 25# 2x10 Standing hip adduction with 3# cable with slow return 2x10 BIL with UE support Standing hip flexion with subsequent knee extension with 3# cable 2x10 Rt only with UE support (when performing on Lt, pt had increase in pain in Rt hip) Thomas stretch x41mn BIL LTR x10 BIL Sidelying lumbar open book x10 BIL Side knee plank with hip abduction 2x10 BIL   [facility] Adult PT Treatment:                                                DATE: 07/26/2022 Therapeutic Exercise: Supine 90/90 mini bicycles with handhold resistance through thighs 3x20 Standing hamstring curls with 3# cable 2x10 BIL with UE support Standing hip flexion with subsequent knee extension with 3# cable 2x10 BIL with UE support Standing hip adduction with 3# cable with slow return 2x10 BIL with UE support  Manual Therapy: N/A Neuromuscular re-ed: N/A Therapeutic Activity: 10# kettlebell RBenindeadlift with intstructions for proper lifting form 3x10 Front-to-back alternating bouncing weight shifts with alternating punching in order  provide graded exposure for Zumba workouts 3x1 min Squat twist punches 3x1 min Modalities: N/A Self Care: N/A      PATIENT EDUCATION:  Education details: Pt educated on probable underlying pathophysiology, POC, prognosis, FOTO, and HEP Person educated: Patient Education method: EConsulting civil engineer Demonstration, and Handouts Education comprehension: verbalized understanding and returned demonstration     HOME EXERCISE PROGRAM: Access Code: [email or web address]: [email or web address] Date: 05/16/2022 Prepared by: TVanessa   Exercises - Butterfly Groin Stretch  - 1 x daily - 7 x weekly - 2-min hold - Standing Quad Stretch with Table and Chair Support  - 1 x daily - 7 x weekly - 2-min hold - Standing ITB Stretch  - 1 x daily - 7 x weekly - 2-min hold - Mini Squat with Counter Support  - 1 x daily - 7 x weekly - 3 sets - 12 reps - Sidelying Hip Abduction  - 1 x daily - 7 x weekly - 2 sets - 10 reps - 5-sec hold  Added 07/26/2022: - Twist Punch in Standing  - 1 x daily - 7 x weekly - 3 sets - 60 reps - Standing Boxing Jabs  - 1 x daily - 7 x weekly - 3 sets - 20 reps  Added 08/15/2022:  - Half Deadlift with Kettlebell  - 1 x daily - 7 x weekly - 3 sets - 10 reps - Single-Leg Anti-Rotation Press With Anchored Resistance  - 1 x daily - 7 x weekly - 3 sets - 10 reps   ASSESSMENT:   CLINICAL IMPRESSION: Upon re-assessment of pt goals, she has met all of her functional rehab goals and nearly met her FOTO goal. She responded well to new exercises today designed to target hip extensors and rotators so the pt can continue to reinforce these muscles independently. She is discharged from PT at this time.   OBJECTIVE IMPAIRMENTS Abnormal gait, decreased balance, decreased endurance, decreased mobility, difficulty walking, decreased ROM, decreased strength, hypomobility, impaired flexibility, improper body mechanics, postural dysfunction, and pain.    ACTIVITY LIMITATIONS  bending, standing, squatting, stairs, and locomotion level   PARTICIPATION LIMITATIONS: laundry, shopping, community activity, and yard work   PERSONAL  FACTORS 3+ comorbidities: See medical hx  are also affecting patient's functional outcome.        GOALS: Goals reviewed with patient? Yes   SHORT TERM GOALS: Target date: 06/13/2022  Pt will report understanding and adherence to initial HEP in order to promote independence in the management of primary impairments. Baseline: HEP provided at eval Goal status: MET     LONG TERM GOALS: Target date: 07/11/2022    Pt will achieve a FOTO score of 73% in order to demonstrate improved functional ability as it relates to her primary impairments.  Baseline: 61% 06/28/2022: 67% 07/11/2022: 65% 08/15/2022: 70% Goal status: NEARLY MET   2.  Pt will report ability to walk >15 minutes with 0/10 pain in order to return to walking track with less limitation. Baseline: >4/10 pain with 5 minutes of walking 06/28/2022: Pt reports ability to walk 1 hour with 2/10 pain Goal status: ACHIEVED   3.  Pt will achieve global BIL hip and knee strength of 4+/5 or greater in order to perform ADLs such as getting down on the floor to clean under her couch with less limitation. Baseline: See MMT chart 06/28/2022: See updated MMT chart 07/11/2022: See updated MMT chart 08/15/2022: See updated MMT chart Goal status: ACHIEVED   4.  Pt will achieve a 5xSTS in 12 seconds or less in order to demonstrate safe transfers for community activity.  Baseline: 15 seconds 07/11/2022: 12 seconds Goal status: ACHIEVED         PLAN: PT FREQUENCY: 1x/week   PT DURATION: 4 weeks   PLANNED INTERVENTIONS: Therapeutic exercises, Therapeutic activity, Neuromuscular re-education, Balance training, Gait training, Patient/Family education, Self Care, Joint mobilization, Stair training, Orthotic/Fit training, Aquatic Therapy, Dry Needling, Electrical stimulation, Spinal mobilization,  Cryotherapy, Moist heat, Taping, Biofeedback, Ionotophoresis 31m/ml Dexamethasone, Manual therapy, and Re-evaluation   PLAN FOR NEXT SESSION: Pt is discharged from PT at this time.   PHYSICAL THERAPY DISCHARGE SUMMARY  Visits from Start of Care: 11  Current functional level related to goals / functional outcomes: Pt has met or mostly met all of her rehab goals.   Remaining deficits: N/A   Education / Equipment: HEP   Patient agrees to discharge. Patient goals were met. Patient is being discharged due to meeting the stated rehab goals.   YVanessa [place] PT, DPT 08/15/22 11:24 AM

## 2022-08-17 ENCOUNTER — Encounter: Payer: Self-pay | Admitting: Internal Medicine

## 2022-08-21 ENCOUNTER — Encounter: Payer: Self-pay | Admitting: Internal Medicine

## 2022-08-21 NOTE — Telephone Encounter (Signed)
I cant find the link for the report just that it was done .[name] Pleaseadvise where I can look  in ehr

## 2022-08-24 ENCOUNTER — Encounter: Payer: Self-pay | Admitting: Internal Medicine

## 2022-08-25 NOTE — Telephone Encounter (Signed)
So  the screening  mammogram showed some ass\ymetry and they asdvise a closer view and maybe ultrasound to see if there is an abnormality.  . Fortunately most  call backs from screening mammograms  for closer magnified views turnout to be a normal.   . But you did the right thing and make the appt for the follow up views .

## 2022-08-29 DIAGNOSIS — I10 Essential (primary) hypertension: Secondary | ICD-10-CM

## 2022-08-29 DIAGNOSIS — I4891 Unspecified atrial fibrillation: Secondary | ICD-10-CM

## 2022-08-30 DIAGNOSIS — R922 Inconclusive mammogram: Secondary | ICD-10-CM | POA: Diagnosis not present

## 2022-08-30 DIAGNOSIS — R928 Other abnormal and inconclusive findings on diagnostic imaging of breast: Secondary | ICD-10-CM | POA: Diagnosis not present

## 2022-08-30 LAB — HM MAMMOGRAPHY

## 2022-08-31 ENCOUNTER — Encounter: Payer: Self-pay | Admitting: Cardiology

## 2022-09-04 ENCOUNTER — Encounter: Payer: Self-pay | Admitting: Internal Medicine

## 2022-09-04 DIAGNOSIS — D485 Neoplasm of uncertain behavior of skin: Secondary | ICD-10-CM | POA: Diagnosis not present

## 2022-09-04 DIAGNOSIS — C44319 Basal cell carcinoma of skin of other parts of face: Secondary | ICD-10-CM | POA: Diagnosis not present

## 2022-09-04 NOTE — Progress Notes (Signed)
[facility] 

## 2022-09-07 ENCOUNTER — Encounter (INDEPENDENT_AMBULATORY_CARE_PROVIDER_SITE_OTHER): Payer: Medicare Other | Admitting: Ophthalmology

## 2022-09-07 ENCOUNTER — Other Ambulatory Visit: Payer: Self-pay

## 2022-09-07 DIAGNOSIS — N6012 Diffuse cystic mastopathy of left breast: Secondary | ICD-10-CM | POA: Diagnosis not present

## 2022-09-07 DIAGNOSIS — N6325 Unspecified lump in the left breast, overlapping quadrants: Secondary | ICD-10-CM | POA: Diagnosis not present

## 2022-09-07 LAB — HM MAMMOGRAPHY

## 2022-09-12 NOTE — Progress Notes (Signed)
Benign findings    noted on bx  good news

## 2022-09-13 ENCOUNTER — Encounter: Payer: Self-pay | Admitting: Internal Medicine

## 2022-09-14 ENCOUNTER — Other Ambulatory Visit (HOSPITAL_BASED_OUTPATIENT_CLINIC_OR_DEPARTMENT_OTHER): Payer: Self-pay

## 2022-09-14 MED ORDER — AREXVY 120 MCG/0.5ML IM SUSR
INTRAMUSCULAR | 0 refills | Status: DC
  Filled 2022-09-14: qty 0.5, 1d supply, fill #0

## 2022-09-14 NOTE — Telephone Encounter (Signed)
I advise still separating  them although cdc says can do all  . They have not been tested that way so in case of a side effect  advise separation.  I dont think  the biopsy interferes at all since  Not a factor . currently

## 2022-09-18 NOTE — Telephone Encounter (Signed)
1-2 weeks is fine if no side effects from first vaccine

## 2022-09-19 ENCOUNTER — Encounter: Payer: Self-pay | Admitting: Internal Medicine

## 2022-09-20 ENCOUNTER — Ambulatory Visit: Payer: Medicare Other | Admitting: Emergency Medicine

## 2022-09-20 ENCOUNTER — Encounter: Payer: Self-pay | Admitting: Emergency Medicine

## 2022-09-20 VITALS — BP 110/62 | HR 66 | Temp 97.8°F | Ht 64.5 in | Wt 96.0 lb

## 2022-09-20 DIAGNOSIS — R053 Chronic cough: Secondary | ICD-10-CM | POA: Diagnosis not present

## 2022-09-20 NOTE — Assessment & Plan Note (Signed)
Her bronchoscopy was reassuring last year, repeat CT scan of the chest 07/2022 without any significant change.  Her cough is improved, is happening principally for about the first 15 minutes in the evening after she lays down.  She did not feel comfortable increasing her PPI.  She is now off of this as well as loratadine.  She is interested in trying sodium bicarbonate which I think is reasonable.  Reviewed the contributors to her cough with her today.  If persists, becomes productive, is associated with radiographic changes then we could consider a repeat bronchoscopy.

## 2022-09-20 NOTE — Progress Notes (Signed)
Subjective:    Patient ID: [name] [name], female    DOB: [birth year], 71 y.o.   MRN: [id number]  HPI  ROV 11/01/21 --71 year old never smoker followed for cough and cylindrical bronchiectasis noted on CT scan of the chest.  Past medical history also significant for hypertension, A. fib (ablated), allergic rhinitis.  She had COVID-19 in mid August 2022. She underwent bronchoscopy on 09/05/2021.  Bacterial fungal and AFB cultures all negative. PFT have not been done She developed URI symptoms beginning about 5 days ago - no known exposures. COVID negative. No fever clear head mucous.   ROV 01/31/22 --follow-up visit for [name] [name].  She has never smoked, has chronic cough with some cylindrical bronchiectatic change noted on CT scan of the chest.  Also with a history of hypertension, atrial fibrillation and allergic rhinitis, COVID-19 in 05/2021.  I saw her in January, treated her for an acute sinusitis and evolving bronchitis.  Also planned to schedule antihistamine, she did not do. Still having cough but less so than last visit. Non-productive.   Pulmonary function testing performed today and reviewed by me show mixed obstruction and restriction on spirometry without a bronchodilator response.  Normal lung volumes.  Normal diffusion capacity.  No significant curve of her flow-volume loop that would be consistent with obstruction   ROV 09/20/2022 --71 year old woman, never smoker whom I have followed for chronic cough.  Likely contribution of rhinitis, question GERD, as well as mixed obstruction and restriction on pulmonary function testing.  She does have some mild cylindrical bronchiectasis on CT scan of the chest, had COVID-19 in August 2022.  Bronchoscopy 09/05/2021 was AFB and fungal culture negative.  She underwent a high-resolution CT chest 08/02/2022 as below.  We had discussed starting omeprazole and loratadine at her last visit, ultimately she did not start these. Her cough is still happening  especially at night when she lays down for about 15 minutes. She did not take the omeprazole, but has tried some NaBicarb.   High-resolution CT scan of the chest 08/02/2022 reviewed by me, shows no mediastinal or hilar adenopathy, widespread mild cylindrical bronchiectatic change with some peribronchovascular thickening and micronodularity, probable mucoid impaction that is unchanged compared with 08/25/2021   Review of Systems As per HPI     Objective:   Physical Exam Vitals:   09/20/22 1336  BP: 110/62  Pulse: 66  Temp: 97.8 F (36.6 C)  TempSrc: Oral  SpO2: 96%  Weight: 96 lb (43.5 kg)  Height: 5' 4.5" (1.638 m)    Gen: Pleasant, thin, in no distress,  normal affect  ENT: No lesions,  mouth clear,  oropharynx clear, no postnasal drip  Neck: No JVD, no stridor  Lungs: No use of accessory muscles, no crackles or wheezing on normal respiration, no wheeze on forced expiration  Cardiovascular: RRR, heart sounds normal, no murmur or gallops, no peripheral edema  Musculoskeletal: No deformities, no cyanosis or clubbing  Neuro: alert, awake, non focal  Skin: Warm, no lesions or rash      Assessment & Plan:   Cough Her bronchoscopy was reassuring last year, repeat CT scan of the chest 07/2022 without any significant change.  Her cough is improved, is happening principally for about the first 15 minutes in the evening after she lays down.  She did not feel comfortable increasing her PPI.  She is now off of this as well as loratadine.  She is interested in trying sodium bicarbonate which I think is reasonable.  Reviewed  the contributors to her cough with her today.  If persists, becomes productive, is associated with radiographic changes then we could consider a repeat bronchoscopy.  Time spent 31 minutes  Baltazar Apo, MD, PhD 09/20/2022, 2:05 PM [facility] [phone] or if no answer before 7:00PM call [phone] For any issues after 7:00PM  please call eLink [phone]

## 2022-09-20 NOTE — Patient Instructions (Signed)
We reviewed your CT scan of the chest today We will hold off on starting omeprazole or loratadine for now. Okay for you to try using sodium bicarbonate (baking soda) or Tums as needed for suspected acid reflux that may be contributing to your cough. Follow with Dr. Lamonte Sakai in 12 months or sooner if you have any problems.

## 2022-09-22 ENCOUNTER — Other Ambulatory Visit (HOSPITAL_BASED_OUTPATIENT_CLINIC_OR_DEPARTMENT_OTHER): Payer: Self-pay

## 2022-09-22 MED ORDER — INFLUENZA VAC A&B SA ADJ QUAD 0.5 ML IM PRSY
PREFILLED_SYRINGE | INTRAMUSCULAR | 0 refills | Status: DC
  Filled 2022-09-22: qty 0.5, 1d supply, fill #0

## 2022-09-25 ENCOUNTER — Encounter: Payer: Self-pay | Admitting: Internal Medicine

## 2022-09-26 DIAGNOSIS — C44319 Basal cell carcinoma of skin of other parts of face: Secondary | ICD-10-CM | POA: Diagnosis not present

## 2022-09-29 ENCOUNTER — Encounter: Payer: Self-pay | Admitting: Internal Medicine

## 2022-09-29 ENCOUNTER — Ambulatory Visit: Payer: Medicare Other | Admitting: Internal Medicine

## 2022-09-29 VITALS — BP 148/88 | HR 62 | Ht 64.5 in | Wt 95.4 lb

## 2022-09-29 DIAGNOSIS — E559 Vitamin D deficiency, unspecified: Secondary | ICD-10-CM

## 2022-09-29 DIAGNOSIS — M810 Age-related osteoporosis without current pathological fracture: Secondary | ICD-10-CM

## 2022-09-29 NOTE — Progress Notes (Signed)
Patient ID: [name] [name], female   DOB: Oct 05, 1951, 71 y.o.   MRN: [id number]   HPI  [name] [name] is a 71 y.o.-year-old pleasant female, returning for follow-up for osteoporosis.  Last visit 1 year ago. She has Medicare.  Interim history: No falls or fractures since last visit. No dizziness/vertigo/orthostasis/poor vision. She had a basal cell CA removed recently from R temple >> bruising under R eye. She had hip pain >> started PT >> now exercising more: walking, gym:3x a week.  Reviewed history: She was diagnosed with osteoporosis in 2017.  Reviewed previous DXA scan reports: 09/28/2020 ([place]) Lumbar spine L1-L4 Femoral neck (FN)  T-score  -1.9 RFN: -2.5 LFN: -2.3  Change in BMD from previous DXA test (%) Down 2.% Up 2.6%   09/23/2018 ([place]) Lumbar spine L1-L4 Femoral neck (FN)  T-score  -1.7 RFN: -2.7 LFN: -2.3  Change in BMD from previous DXA test (%) Up 1.4% Down 0.4%   Date L1-L4 T score FN T score FRAX   07/27/2016  -1.8 (-3.4%*) RFN: -2.6 (-10.2%*) LFN: -2.4   09/30/2010  -1.6 RFN: -2.0 LFN: -1.8 10 year MOF: 11.9%  10 year hip fracture: 2.2%    No fracture since last visitshe has a history of fract, however, ures: 1. collarbone - ~2010 - fell in a parking lot 2. L foot fx and sprained ankle - few years ago - tripped and fell 3. R foot fx and sprained ankle - 10/2015 She had pain in L shin >> saw Dr. Lanell Matar >> had Xrays >> osteoma.  Pain improved.  Reviewed previous osteoporosis treatments: - Fosamax for 5 years  - up to 2011 - We restarted Fosamax 70 mg weekly in 12/2016.  She tolerates this well  She has a history of vitamin D insufficiency: Lab Results  Component Value Date   VD25OH 42.31 09/29/2021   VD25OH 44.3 09/28/2020   VD25OH 57.64 11/20/2019   VD25OH 43.25 09/23/2018   VD25OH 40.16 10/19/2017   VD25OH 31.41 12/20/2016   VD25OH 28.88 (L) 09/14/2016   She is on 650 mg of calcium and 2000 units vitamin D daily  + 1000 IU from  [employer].  She was walking and also doing Zumba, strength training 1-2 times a week but not at last visit.  Since then, she started physical therapy and now continues to exercise 3 times a week: Walking, some strength exercises.  She is not on vitamin A.  Menopause was in her late 53s.  She was on HRT.  Pt does have a FH of osteoporosis in mother - kyphosis.  No fractures.  No history of kidney stones.  No hyper or hypocalcemia or hyperparathyroidism: Lab Results  Component Value Date   PTH 59 09/14/2016   CALCIUM 10.2 04/17/2022   CALCIUM 10.5 01/11/2022   CALCIUM 9.4 10/18/2021   CALCIUM 9.5 07/05/2021   CALCIUM 8.7 (L) 06/24/2021   CALCIUM 9.4 06/09/2021   CALCIUM 9.4 06/03/2021   CALCIUM 9.6 06/02/2021   CALCIUM 9.0 06/01/2021   CALCIUM 9.5 05/31/2021   No history of thyrotoxicosis: Lab Results  Component Value Date   TSH 1.17 01/11/2022   TSH 0.891 06/24/2021   TSH 0.82 09/28/2020   TSH 0.867 10/06/2019   TSH 0.80 05/17/2018   No history of CKD: Lab Results  Component Value Date   BUN 19 04/17/2022   CREATININE 0.70 04/17/2022   She is on flecainide, metoprolol, Eliquis for paroxysmal A. fib.  She continues to go to the A. fib  clinic.  She noticed that after stopping Singulair, she had less A. fib episodes. She had pelvic training at [facility].   ROS: + see HPI  I reviewed pt's medications, allergies, PMH, social hx, family hx, and changes were documented in the history of present illness. Otherwise, unchanged from my initial visit note.  Past Medical History:  Diagnosis Date   Allergic rhinitis    Aortic insufficiency    CAT SCRATCH 09/10/2009   Qualifier: Diagnosis of  By: Regis Bill MD, [name]    Clavicle fracture 12/2006   left   Diverticulosis    HTN (hypertension)    Osteopenia    Paroxysmal atrial fibrillation (HCC)    Pneumonia    PONV (postoperative nausea and vomiting)    Vision disturbance 03/13/2013   Negative neuro MRA MRI   Past  Surgical History:  Procedure Laterality Date   ABDOMINAL HYSTERECTOMY     pt denies 04/15/14   APPENDECTOMY     ATRIAL FIBRILLATION ABLATION N/A 02/01/2021   Procedure: ATRIAL FIBRILLATION ABLATION;  Surgeon: Thompson Grayer, MD;  Location: [place] [facility];  Service: Cardiovascular;  Laterality: N/A;   BRONCHIAL WASHINGS  09/05/2021   Procedure: BRONCHIAL WASHINGS;  Surgeon: Collene Gobble, MD;  Location: [facility];  Service: Cardiopulmonary;;   LAPAROSCOPIC SALPINGO OOPHERECTOMY Right    TONSILLECTOMY AND ADENOIDECTOMY     VIDEO BRONCHOSCOPY N/A 09/05/2021   Procedure: VIDEO BRONCHOSCOPY WITHOUT FLUORO;  Surgeon: Collene Gobble, MD;  Location: [place];  Service: Cardiopulmonary;  Laterality: N/A;   Social History   Social History   Marital status: Married    Spouse name: N/A   Number of children: 0 - had infertility tx's in her 28s   Occupational History   Newspaper Jouralist    [employer]   Social History Main Topics   Smoking status: Never Smoker   Smokeless tobacco: Never Used   Alcohol use No     Comment: never   Drug use: No   Social History Narrative   Married   hh of 2    p0g0   Works for [employer] and record    Current Outpatient Medications on File Prior to Visit  Medication Sig Dispense Refill   acetaminophen (TYLENOL) 500 MG tablet Take 1,000 mg by mouth every 6 (six) hours as needed for moderate pain.     alendronate (FOSAMAX) 70 MG tablet TAKE 1 TABLET BY MOUTH  WEEKLY WITH A FULL GLASS OF WATER ON AN EMPTY STOMACH 12 tablet 3   apixaban (ELIQUIS) 5 MG TABS tablet TAKE 1 TABLET BY MOUTH TWICE  DAILY 180 tablet 2   Calcium Citrate-Vitamin D (CITRACAL + D PO) Take 325 mg by mouth in the morning and at bedtime. With 1000 units of vitamin D total     Cholecalciferol (VITAMIN D3) 1000 units CAPS Take 1,000 Units by mouth daily.     Coenzyme Q10 (COQ10) 100 MG CAPS 1 capsule daily.     diltiazem (CARDIZEM) 30 MG tablet TAKE 1 TABLET EVERY 4 HOURS AS  NEEDED FOR BREAKTHROUGH AFIB 45 tablet 1   diltiazem (TIAZAC) 300 MG 24 hr capsule TAKE 1 CAPSULE BY MOUTH  DAILY 90 capsule 3   dofetilide (TIKOSYN) 250 MCG capsule TAKE 1 CAPSULE BY MOUTH 2 TIMES DAILY. 180 capsule 2   influenza vaccine adjuvanted (FLUAD) 0.5 ML injection Inject into the muscle. 0.5 mL 0   ketoconazole (NIZORAL) 2 % cream Apply 1 Application topically daily. 15 g  1   LORazepam (ATIVAN) 0.5 MG tablet Take 1 tablet (0.5 mg total) by mouth daily as needed for anxiety (when flying). 20 tablet 0   metoprolol tartrate (LOPRESSOR) 25 MG tablet Take 1 tablet in the AM and 2 tablets in the PM 270 tablet 3   Multiple Vitamins-Minerals (PRESERVISION AREDS PO) Take 1 capsule by mouth in the morning and at bedtime.     potassium chloride (KLOR-CON) 10 MEQ tablet TAKE 2 TABLETS BY MOUTH DAILY 180 tablet 3   pravastatin (PRAVACHOL) 20 MG tablet Take 1 tablet (20 mg total) by mouth every evening. 90 tablet 3   RSV vaccine recomb adjuvanted (AREXVY) 120 MCG/0.5ML injection Inject into the muscle. 0.5 mL 0   No current facility-administered medications on file prior to visit.   Allergies  Allergen Reactions   Latex Hives   Other Rash and Other (See Comments)    Adhesive pads left on skin for extended periods = BURNS AND RASHES ON THE SKIN!!!!   Codeine Phosphate Other (See Comments)    Drowsiness    Demerol [Meperidine] Nausea And Vomiting   Protonix [Pantoprazole] Rash   Sulfa Antibiotics Rash   Tens Therapy Replace Back Pads Itching and Rash   Family History  Problem Relation Age of Onset   Scleroderma Mother    Hyperlipidemia Mother    Coronary artery disease Father 89   Esophageal cancer Paternal Grandmother    PE: BP (!) 148/88 (BP Location: Right Arm, Patient Position: Sitting, Cuff Size: Small)   Pulse 62   Ht 5' 4.5" (1.638 m)   Wt 95 lb 6.4 oz (43.3 kg)   SpO2 98%   BMI 16.12 kg/m  Wt Readings from Last 3 Encounters:  09/29/22 95 lb 6.4 oz (43.3 kg)  09/20/22 96  lb (43.5 kg)  07/10/22 91 lb 9.6 oz (41.5 kg)   Constitutional: normal weight, in NAD Eyes:  EOMI, no exophthalmos ENT: no neck masses, no cervical lymphadenopathy Cardiovascular: RRR, No MRG Respiratory: CTA B Musculoskeletal: no deformities Skin:no rashes Neurological: no tremor with outstretched hands  Assessment: 1. Osteoporosis  2. Vit D insufficiency  Plan: 1. Osteoporosis -Likely postmenopausal/age-related and she also has a family history of osteoporosis -She continues to remain at high risk for fractures since her T-scores are lower than -2.5 -She initially agreed to start Prolia, however, this was too expensive so we ended up starting Fosamax 70 mg weekly 12/2017.  She tolerates this well, without side effects.  She denies hip/thigh/jaw pain.  No dental work in progress. -She previously has pain in the left shin and was found to have an osteoma with some muscle edema around it.  Since then, pain has improved -I reviewed her latest only scan report from 09/28/2020, which showed improved T-scores at the right femoral neck (but still in the osteoporotic range) and slightly lower T-scores at the spine.  Left femoral neck T score was stable. -She is now due for another bone density scan.  We will order this. -She completed 5.5 years of Fosamax.  We discussed about deciding whether to continue or give her a drug holiday after the next bone density results return.  However, she is at the high risk for fracture so we may need to continue. -She also continues on vitamin D 3000 units daily -She is aware about fall precautions.  No falls or fractures since last visit -I again reinforced the need for weightbearing exercises  (also adding weights when walking, walking downhill, walking after a  meal, rather than before).  I advised her to exercise 30 minutes a day 5 times a week at least.  -Latest calcium and kidney function were normal: Lab Results  Component Value Date   CALCIUM 10.2  04/17/2022   Lab Results  Component Value Date   BUN 19 04/17/2022   Lab Results  Component Value Date   CREATININE 0.70 04/17/2022  -I will see her back in a year  2. Vit D insufficiency -She continues on vitamin D daily.  After last visit, she sent me a message about the fact that she was taking 1000 units vitamin D daily.  I advised her to continue.  However, since last visit she increased the dose to 3000 units daily -Latest vitamin D level was normal at last visit -We will recheck her vitamin D level  -she prefers to return for this  Orders Placed This Encounter  Procedures   Vitamin D, 25-hydroxy   She needs to lay down for labs (2/2 nausea).  Philemon Kingdom, MD PhD [facility]

## 2022-09-29 NOTE — Patient Instructions (Addendum)
Please schedule the new bone density scan. Please call and schedule bone density scan at the [facility]: [phone].   Continue Fosamax 70 mg weekly.  Continue vitamin D 3000 units daily.  Please come back for labs.   Please come back for a follow-up appointment in 1 year.

## 2022-10-05 ENCOUNTER — Ambulatory Visit: Payer: Medicare Other | Attending: Cardiology | Admitting: Cardiology

## 2022-10-05 ENCOUNTER — Encounter: Payer: Self-pay | Admitting: Cardiology

## 2022-10-05 VITALS — BP 134/62 | HR 60 | Ht 64.5 in | Wt 94.8 lb

## 2022-10-05 DIAGNOSIS — Z79899 Other long term (current) drug therapy: Secondary | ICD-10-CM

## 2022-10-05 DIAGNOSIS — I480 Paroxysmal atrial fibrillation: Secondary | ICD-10-CM

## 2022-10-05 LAB — BASIC METABOLIC PANEL
BUN/Creatinine Ratio: 32 — ABNORMAL HIGH (ref 12–28)
BUN: 22 mg/dL (ref 8–27)
CO2: 25 mmol/L (ref 20–29)
Calcium: 10.6 mg/dL — ABNORMAL HIGH (ref 8.7–10.3)
Chloride: 101 mmol/L (ref 96–106)
Creatinine, Ser: 0.69 mg/dL (ref 0.57–1.00)
Glucose: 80 mg/dL (ref 70–99)
Potassium: 4.6 mmol/L (ref 3.5–5.2)
Sodium: 140 mmol/L (ref 134–144)
eGFR: 94 mL/min/{1.73_m2} (ref >59)

## 2022-10-05 LAB — MAGNESIUM: Magnesium: 2.2 mg/dL (ref 1.6–2.3)

## 2022-10-05 NOTE — Progress Notes (Signed)
Cardiology Office Note   Date:  10/06/2022   ID:  [name] [name], DOB [birth year], MRN [id number]  PCP:  Burnis Medin, MD  Cardiologist:   Minus Breeding, MD    Chief Complaint  Patient presents with   Atrial Fibrillation      History of Present Illness: [name] [name] is a 71 y.o. female who for follow up of palpitations and aortic insufficiency.  She has paroxysmal atrial fibrillation. She is status post ablation.   She had Covid and she had increased fib post this.  She was started on Tikosyn.   An echo in June 2023 demonstrated a well preserved EF and moderate to severe AI unchanged from the previous echo. Ventricular size and function was normal.     She said she had an episode of atrial fibrillation last night at a restaurant.  She still getting these periodically.  She at this point does not want to consider repeat ablation.  Amiodarone was a possibility as well.  However, she is fairly content with the status quo.  She had no new shortness of breath, PND or orthopnea.  She has had no new chest pressure, neck or arm discomfort.   Past Medical History:  Diagnosis Date   Allergic rhinitis    Aortic insufficiency    CAT SCRATCH 09/10/2009   Qualifier: Diagnosis of  By: Regis Bill MD, [name]    Clavicle fracture 12/2006   left   Diverticulosis    HTN (hypertension)    Osteopenia    Paroxysmal atrial fibrillation (HCC)    Pneumonia    PONV (postoperative nausea and vomiting)    Vision disturbance 03/13/2013   Negative neuro MRA MRI    Past Surgical History:  Procedure Laterality Date   ABDOMINAL HYSTERECTOMY     pt denies 04/15/14   APPENDECTOMY     ATRIAL FIBRILLATION ABLATION N/A 02/01/2021   Procedure: ATRIAL FIBRILLATION ABLATION;  Surgeon: Thompson Grayer, MD;  Location: [place] [facility];  Service: Cardiovascular;  Laterality: N/A;   BRONCHIAL WASHINGS  09/05/2021   Procedure: BRONCHIAL WASHINGS;  Surgeon: Collene Gobble, MD;  Location: [facility];  Service:  Cardiopulmonary;;   LAPAROSCOPIC SALPINGO OOPHERECTOMY Right    TONSILLECTOMY AND ADENOIDECTOMY     VIDEO BRONCHOSCOPY N/A 09/05/2021   Procedure: VIDEO BRONCHOSCOPY WITHOUT FLUORO;  Surgeon: Collene Gobble, MD;  Location: [place];  Service: Cardiopulmonary;  Laterality: N/A;     Current Outpatient Medications  Medication Sig Dispense Refill   acetaminophen (TYLENOL) 500 MG tablet Take 1,000 mg by mouth every 6 (six) hours as needed for moderate pain.     alendronate (FOSAMAX) 70 MG tablet TAKE 1 TABLET BY MOUTH  WEEKLY WITH A FULL GLASS OF WATER ON AN EMPTY STOMACH 12 tablet 3   apixaban (ELIQUIS) 5 MG TABS tablet TAKE 1 TABLET BY MOUTH TWICE  DAILY 180 tablet 2   Calcium Citrate-Vitamin D (CITRACAL + D PO) Take 325 mg by mouth in the morning and at bedtime. With 1000 units of vitamin D total     Cholecalciferol (VITAMIN D3) 1000 units CAPS Take 1,000 Units by mouth daily.     Coenzyme Q10 (COQ10) 100 MG CAPS 1 capsule daily.     diltiazem (CARDIZEM) 30 MG tablet TAKE 1 TABLET EVERY 4 HOURS AS NEEDED FOR BREAKTHROUGH AFIB 45 tablet 1   diltiazem (TIAZAC) 300 MG 24 hr capsule TAKE 1 CAPSULE BY MOUTH  DAILY 90 capsule 3   dofetilide (TIKOSYN)  250 MCG capsule TAKE 1 CAPSULE BY MOUTH 2 TIMES DAILY. 180 capsule 2   influenza vaccine adjuvanted (FLUAD) 0.5 ML injection Inject into the muscle. 0.5 mL 0   LORazepam (ATIVAN) 0.5 MG tablet Take 1 tablet (0.5 mg total) by mouth daily as needed for anxiety (when flying). 20 tablet 0   metoprolol tartrate (LOPRESSOR) 25 MG tablet Take 1 tablet in the AM and 2 tablets in the PM 270 tablet 3   Multiple Vitamins-Minerals (PRESERVISION AREDS PO) Take 1 capsule by mouth in the morning and at bedtime.     potassium chloride (KLOR-CON) 10 MEQ tablet TAKE 2 TABLETS BY MOUTH DAILY 180 tablet 3   pravastatin (PRAVACHOL) 20 MG tablet Take 1 tablet (20 mg total) by mouth every evening. 90 tablet 3   RSV vaccine recomb adjuvanted (AREXVY) 120 MCG/0.5ML  injection Inject into the muscle. 0.5 mL 0   No current facility-administered medications for this visit.    Allergies:   Latex, Other, Codeine phosphate, Demerol [meperidine], Protonix [pantoprazole], Sulfa antibiotics, and Tens therapy replace back pads    ROS:  Please see the history of present illness.   Otherwise, review of systems are positive for none.   All other systems are reviewed and negative.    PHYSICAL EXAM: VS:  BP 130/60 (BP Location: Left Arm, Patient Position: Sitting, Cuff Size: Normal)   Pulse 61   Ht '5\' 5"'$  (1.651 m)   Wt 95 lb (43.1 kg)   BMI 15.81 kg/m  , BMI Body mass index is 15.81 kg/m. GENERAL:  Well appearing, slightly frail and very thin NECK:  No jugular venous distention, waveform within normal limits, carotid upstroke brisk and symmetric, no bruits, no thyromegaly LUNGS:  Clear to auscultation bilaterally CHEST:  Unremarkable HEART:  PMI not displaced or sustained,S1 and S2 within normal limits, no S3, no S4, no clicks, no rubs, 2 out of 6 brief apical systolic murmur, 3 out of 6 diastolic murmur heard throughout the precordium ABD:  Flat, positive bowel sounds normal in frequency in pitch, no bruits, no rebound, no guarding, no midline pulsatile mass, no hepatomegaly, no splenomegaly EXT:  2 plus pulses throughout, no edema, no cyanosis no clubbing    EKG:  EKG is not ordered today.    Recent Labs: 01/11/2022: ALT 14; Hemoglobin 13.3; Platelets 312.0; TSH 1.17 10/05/2022: BUN 22; Creatinine, Ser 0.69; Magnesium 2.2; Potassium 4.6; Sodium 140    Lipid Panel    Component Value Date/Time   CHOL 136 06/27/2022 0846   TRIG 64 06/27/2022 0846   HDL 72 06/27/2022 0846   CHOLHDL 1.9 06/27/2022 0846   CHOLHDL 2 01/11/2022 1045   VLDL 16.2 01/11/2022 1045   LDLCALC 51 06/27/2022 0846   LDLDIRECT 131.0 09/03/2009 0813      Wt Readings from Last 3 Encounters:  10/06/22 95 lb (43.1 kg)  10/05/22 94 lb 12.8 oz (43 kg)  09/29/22 95 lb 6.4 oz  (43.3 kg)      Other studies Reviewed: Additional studies/ records that were reviewed today include: EP note, labs Review of the above records demonstrates:  Please see elsewhere in the note.     ASSESSMENT AND PLAN:    Aortic insufficiency -  This was still moderate  to severe again on echo earlier this week.  I will follow this again with echo in June.   Atrial fib-  Ms. Sarabeth D Yarbough has a CHA2DS2 - VASc score of 2.  She had an EKG done yesterday  and I am just pull this up.  This was reviewed by Dr. Curt Bears.  At this point she does not want to consider repeat ablation but she would let us know about this.   HTN - The blood pressure is little bit labile but overall at target.   Elevated coronary calcium - She does have an elevated coronary calcium score noted on the CT.  This was 231 which was 86 percentile.  She had a negative stress test in 2018.    Elevated lipid - She had an excellent response to low-dose of pravastatin.  No change in therapy.  Aortic atherosclerosis - This is managed with aggressive risk reduction.  Ascending aortic dilatation - This is 43 mm in October.  No change in therapy.  I will follow this up probably with repeat imaging in a year.  Current medicines are reviewed at length with the patient today.  The patient does not have concerns regarding medicines.  The following changes have been made:  None  Labs/ tests ordered today include:  None  Orders Placed This Encounter  Procedures   ECHOCARDIOGRAM COMPLETE      Disposition:   FU with me in 6 months.    Signed, Minus Breeding, MD  10/06/2022 9:29 AM     [facility]

## 2022-10-05 NOTE — Patient Instructions (Signed)
Medication Instructions:  Your physician recommends that you continue on your current medications as directed. Please refer to the Current Medication list given to you today.  *If you need a refill on your cardiac medications before your next appointment, please call your pharmacy*   Lab Work: Tikosyn surveillance labs today:  Magnesium & BMET  If you have labs (blood work) drawn today and your tests are completely normal, you will receive your results only by: MyChart Message (if you have MyChart) OR A paper copy in the mail If you have any lab test that is abnormal or we need to change your treatment, we will call you to review the results.   Testing/Procedures: None ordered   Follow-Up: At [facility], you and your health needs are our priority.  As part of our continuing mission to provide you with exceptional heart care, we have created designated Provider Care Teams.  These Care Teams include your primary Cardiologist (physician) and Advanced Practice Providers (APPs -  Physician Assistants and Nurse Practitioners) who all work together to provide you with the care you need, when you need it.  Your next appointment:   6 month(s)  The format for your next appointment:   In Person  Provider:   Allegra Lai, MD    Thank you for choosing [facility]!!   Trinidad Curet, RN [phone]  Other Instructions   Important Information About [facility]

## 2022-10-05 NOTE — Progress Notes (Signed)
Electrophysiology Office Note   Date:  10/05/2022   ID:  [name] [name], DOB 07/03/1951, MRN [id number]  PCP:  Burnis Medin, MD  Cardiologist:  Hochrein Primary Electrophysiologist:  Lorelle Macaluso Meredith Leeds, MD    Chief Complaint: AF   History of Present Illness: [name] [name] is a 71 y.o. female who is being seen today for the evaluation of AF at the request of [name], [name], MD. Presenting today for electrophysiology evaluation.  History seen for hypertension, atrial fibrillation.  She was diagnosed with atrial fibrillation many years ago.  She is status post ablation 02/01/2021.  Since ablation, she has been loaded on dofetilide.  Today, she denies symptoms of palpitations, chest pain, shortness of breath, orthopnea, PND, lower extremity edema, claudication, dizziness, presyncope, syncope, bleeding, or neurologic sequela. The patient is tolerating medications without difficulties.  She is continue to have episodes of atrial fibrillation.  They occur roughly every 2 weeks.  She can take as needed diltiazem which improves her symptoms.  She was having quite a few episodes last summer, but her evening dose of metoprolol was increased which improved her symptoms.  She is happy with her control for now, but may wish an alternative medication eventually.   Past Medical History:  Diagnosis Date   Allergic rhinitis    Aortic insufficiency    CAT SCRATCH 09/10/2009   Qualifier: Diagnosis of  By: Regis Bill MD, [name]    Clavicle fracture 12/2006   left   Diverticulosis    HTN (hypertension)    Osteopenia    Paroxysmal atrial fibrillation (HCC)    Pneumonia    PONV (postoperative nausea and vomiting)    Vision disturbance 03/13/2013   Negative neuro MRA MRI   Past Surgical History:  Procedure Laterality Date   ABDOMINAL HYSTERECTOMY     pt denies 04/15/14   APPENDECTOMY     ATRIAL FIBRILLATION ABLATION N/A 02/01/2021   Procedure: ATRIAL FIBRILLATION ABLATION;  Surgeon: Thompson Grayer, MD;   Location: [facility];  Service: Cardiovascular;  Laterality: N/A;   BRONCHIAL WASHINGS  09/05/2021   Procedure: BRONCHIAL WASHINGS;  Surgeon: Collene Gobble, MD;  Location: [facility];  Service: Cardiopulmonary;;   LAPAROSCOPIC SALPINGO OOPHERECTOMY Right    TONSILLECTOMY AND ADENOIDECTOMY     VIDEO BRONCHOSCOPY N/A 09/05/2021   Procedure: VIDEO BRONCHOSCOPY WITHOUT FLUORO;  Surgeon: Collene Gobble, MD;  Location: [facility];  Service: Cardiopulmonary;  Laterality: N/A;     Current Outpatient Medications  Medication Sig Dispense Refill   acetaminophen (TYLENOL) 500 MG tablet Take 1,000 mg by mouth every 6 (six) hours as needed for moderate pain.     alendronate (FOSAMAX) 70 MG tablet TAKE 1 TABLET BY MOUTH  WEEKLY WITH A FULL GLASS OF WATER ON AN EMPTY STOMACH 12 tablet 3   apixaban (ELIQUIS) 5 MG TABS tablet TAKE 1 TABLET BY MOUTH TWICE  DAILY 180 tablet 2   Calcium Citrate-Vitamin D (CITRACAL + D PO) Take 325 mg by mouth in the morning and at bedtime. With 1000 units of vitamin D total     Cholecalciferol (VITAMIN D3) 1000 units CAPS Take 1,000 Units by mouth daily.     Coenzyme Q10 (COQ10) 100 MG CAPS 1 capsule daily.     diltiazem (CARDIZEM) 30 MG tablet TAKE 1 TABLET EVERY 4 HOURS AS NEEDED FOR BREAKTHROUGH AFIB 45 tablet 1   diltiazem (TIAZAC) 300 MG 24 hr capsule TAKE 1 CAPSULE BY MOUTH  DAILY 90 capsule 3  dofetilide (TIKOSYN) 250 MCG capsule TAKE 1 CAPSULE BY MOUTH 2 TIMES DAILY. 180 capsule 2   influenza vaccine adjuvanted (FLUAD) 0.5 ML injection Inject into the muscle. 0.5 mL 0   LORazepam (ATIVAN) 0.5 MG tablet Take 1 tablet (0.5 mg total) by mouth daily as needed for anxiety (when flying). 20 tablet 0   metoprolol tartrate (LOPRESSOR) 25 MG tablet Take 1 tablet in the AM and 2 tablets in the PM 270 tablet 3   Multiple Vitamins-Minerals (PRESERVISION AREDS PO) Take 1 capsule by mouth in the morning and at bedtime.     potassium chloride (KLOR-CON) 10 MEQ tablet TAKE  2 TABLETS BY MOUTH DAILY 180 tablet 3   pravastatin (PRAVACHOL) 20 MG tablet Take 1 tablet (20 mg total) by mouth every evening. 90 tablet 3   RSV vaccine recomb adjuvanted (AREXVY) 120 MCG/0.5ML injection Inject into the muscle. 0.5 mL 0   No current facility-administered medications for this visit.    Allergies:   Latex, Other, Codeine phosphate, Demerol [meperidine], Protonix [pantoprazole], Sulfa antibiotics, and Tens therapy replace back pads   Social History:  The patient  reports that she has never smoked. She has never used smokeless tobacco. She reports that she does not currently use alcohol. She reports that she does not use drugs.   Family History:  The patient's family history includes Coronary artery disease (age of onset: 69) in her father; Esophageal cancer in her paternal grandmother; Hyperlipidemia in her mother; Scleroderma in her mother.    ROS:  Please see the history of present illness.   Otherwise, review of systems is positive for none.   All other systems are reviewed and negative.    PHYSICAL EXAM: VS:  BP 134/62   Pulse 60   Ht 5' 4.5" (1.638 m)   Wt 94 lb 12.8 oz (43 kg)   SpO2 97%   BMI 16.02 kg/m  , BMI Body mass index is 16.02 kg/m. GEN: Well nourished, well developed, in no acute distress  HEENT: normal  Neck: no JVD, carotid bruits, or masses Cardiac: RRR; no murmurs, rubs, or gallops,no edema  Respiratory:  clear to auscultation bilaterally, normal work of breathing GI: soft, nontender, nondistended, + BS MS: no deformity or atrophy  Skin: warm and dry Neuro:  Strength and sensation are intact Psych: euthymic mood, full affect  EKG:  EKG is ordered today. Personal review of the ekg ordered shows sinus rhythm  Recent Labs: 01/11/2022: ALT 14; Hemoglobin 13.3; Platelets 312.0; TSH 1.17 04/17/2022: BUN 19; Creatinine, Ser 0.70; Magnesium 2.2; Potassium 4.4; Sodium 141    Lipid Panel     Component Value Date/Time   CHOL 136 06/27/2022 0846    TRIG 64 06/27/2022 0846   HDL 72 06/27/2022 0846   CHOLHDL 1.9 06/27/2022 0846   CHOLHDL 2 01/11/2022 1045   VLDL 16.2 01/11/2022 1045   LDLCALC 51 06/27/2022 0846   LDLDIRECT 131.0 09/03/2009 0813     Wt Readings from Last 3 Encounters:  10/05/22 94 lb 12.8 oz (43 kg)  09/29/22 95 lb 6.4 oz (43.3 kg)  09/20/22 96 lb (43.5 kg)      Other studies Reviewed: Additional studies/ records that were reviewed today include: TTE 04/07/22  Review of the above records today demonstrates:   1. Mildly elevated velocity across aortic valve (2.2 m/s) likely due to  moderate to severe AI. Possible mobile plaque in the descending aorta.   2. Left ventricular ejection fraction, by estimation, is 55 to 60%.  The  left ventricle has normal function. The left ventricle has no regional  wall motion abnormalities. Left ventricular diastolic function could not  be evaluated.   3. Right ventricular systolic function is normal. The right ventricular  size is normal.   4. Left atrial size was moderately dilated.   5. The mitral valve is normal in structure. Mild mitral valve  regurgitation. No evidence of mitral stenosis.   6. Tricuspid valve regurgitation is mild to moderate.   7. The aortic valve is normal in structure. Aortic valve regurgitation is  moderate to severe. Aortic valve sclerosis/calcification is present,  without any evidence of aortic stenosis.   8. Aortic dilatation noted. There is mild dilatation of the ascending  aorta, measuring 39 mm.   9. The inferior vena cava is normal in size with greater than 50%  respiratory variability, suggesting right atrial pressure of 3 mmHg.    ASSESSMENT AND PLAN:  1.  Paroxysmal atrial fibrillation: Status post ablation 02/01/2021.  She had multiple atrial flutter circuits.  Status post dofetilide load.  Currently on 250 mg twice daily, Eliquis 5 mg twice daily, metoprolol 25 mg twice daily, diltiazem 300 mg daily.  CHA2DS2-VASc of 3.  She continues  to have episodes of atrial fibrillation.  Despite that, she has somewhat happy with her control.  She does not feel weak or fatigued currently.  She may wish to switch to amiodarone or plan for repeat ablation in the future.  2.  Secondary hypercoagulable state: Currently on Eliquis for atrial fibrillation as above  3.  Hypertension: Currently well-controlled  4.  Aortic insufficiency: Recent echo without major change.  Plan per primary cardiology.  5.  High risk medication monitoring: Currently on dofetilide.  Zadkiel Dragan check BMP and magnesium today.  QT has remained stable.  Current medicines are reviewed at length with the patient today.   The patient does not have concerns regarding her medicines.  The following changes were made today:  none  Labs/ tests ordered today include:  Orders Placed This Encounter  Procedures   Magnesium   Basic metabolic panel   EKG [id number]     Disposition:   FU with Jenefer Woerner 6 months  Signed, Jaspal Pultz Meredith Leeds, MD  10/05/2022 10:25 AM     [address] [place] [address] [phone] (office) [phone] (fax)

## 2022-10-05 NOTE — Addendum Note (Signed)
Addended by: Stanton Kidney on: 10/05/2022 10:31 AM   Modules accepted: Orders

## 2022-10-06 ENCOUNTER — Encounter: Payer: Self-pay | Admitting: Cardiology

## 2022-10-06 ENCOUNTER — Ambulatory Visit: Payer: Medicare Other | Attending: Cardiology | Admitting: Cardiology

## 2022-10-06 VITALS — BP 130/60 | HR 61 | Ht 65.0 in | Wt 95.0 lb

## 2022-10-06 DIAGNOSIS — I10 Essential (primary) hypertension: Secondary | ICD-10-CM | POA: Diagnosis not present

## 2022-10-06 DIAGNOSIS — I700 Atherosclerosis of aorta: Secondary | ICD-10-CM

## 2022-10-06 DIAGNOSIS — R931 Abnormal findings on diagnostic imaging of heart and coronary circulation: Secondary | ICD-10-CM

## 2022-10-06 DIAGNOSIS — E785 Hyperlipidemia, unspecified: Secondary | ICD-10-CM

## 2022-10-06 DIAGNOSIS — I351 Nonrheumatic aortic (valve) insufficiency: Secondary | ICD-10-CM

## 2022-10-06 LAB — VITAMIN D 25 HYDROXY (VIT D DEFICIENCY, FRACTURES): Vit D, 25-Hydroxy: 52 ng/mL (ref 30.0–100.0)

## 2022-10-06 NOTE — Patient Instructions (Signed)
Medication Instructions:   Your physician recommends that you continue on your current medications as directed. Please refer to the Current Medication list given to you today.  *If you need a refill on your cardiac medications before your next appointment, please call your pharmacy*  Lab Work: NONE ordered at this time of appointment   If you have labs (blood work) drawn today and your tests are completely normal, you will receive your results only by: [place] (if you have MyChart) OR A paper copy in the mail If you have any lab test that is abnormal or we need to change your treatment, we will call you to review the results.  Testing/Procedures: Your physician has requested that you have an echocardiogram. Echocardiography is a painless test that uses sound waves to create images of your heart. It provides your doctor with information about the size and shape of your heart and how well your heart's chambers and valves are working. This procedure takes approximately one hour. There are no restrictions for this procedure. Please do NOT wear cologne, perfume, aftershave, or lotions (deodorant is allowed). Please arrive 15 minutes prior to your appointment time.  Please schedule for 6 months   Follow-Up: At [facility], you and your health needs are our priority.  As part of our continuing mission to provide you with exceptional heart care, we have created designated Provider Care Teams.  These Care Teams include your primary Cardiologist (physician) and Advanced Practice Providers (APPs -  Physician Assistants and Nurse Practitioners) who all work together to provide you with the care you need, when you need it.  Your next appointment:   In 6 month(s) after ECHO  The format for your next appointment:   In Person  Provider:   Minus Breeding, MD     Other Instructions  Important Information About Sugar

## 2022-10-09 ENCOUNTER — Encounter: Payer: Self-pay | Admitting: Internal Medicine

## 2022-10-09 DIAGNOSIS — H353132 Nonexudative age-related macular degeneration, bilateral, intermediate dry stage: Secondary | ICD-10-CM | POA: Diagnosis not present

## 2022-10-09 DIAGNOSIS — H35373 Puckering of macula, bilateral: Secondary | ICD-10-CM | POA: Diagnosis not present

## 2022-10-09 DIAGNOSIS — E559 Vitamin D deficiency, unspecified: Secondary | ICD-10-CM

## 2022-10-09 NOTE — Telephone Encounter (Signed)

## 2022-10-10 ENCOUNTER — Ambulatory Visit (INDEPENDENT_AMBULATORY_CARE_PROVIDER_SITE_OTHER)
Admission: RE | Admit: 2022-10-10 | Discharge: 2022-10-10 | Disposition: A | Discharge status: Home / Self Care | Payer: Medicare Other | Source: Ambulatory Visit | Attending: Internal Medicine | Admitting: Internal Medicine

## 2022-10-10 ENCOUNTER — Other Ambulatory Visit: Payer: Self-pay | Admitting: Internal Medicine

## 2022-10-10 DIAGNOSIS — M810 Age-related osteoporosis without current pathological fracture: Secondary | ICD-10-CM

## 2022-10-16 ENCOUNTER — Ambulatory Visit: Payer: Medicare Other | Admitting: Cardiology

## 2022-10-19 ENCOUNTER — Ambulatory Visit: Payer: Medicare Other | Admitting: Cardiology

## 2022-10-26 ENCOUNTER — Other Ambulatory Visit (HOSPITAL_BASED_OUTPATIENT_CLINIC_OR_DEPARTMENT_OTHER): Payer: Self-pay

## 2022-10-26 MED ORDER — COMIRNATY 30 MCG/0.3ML IM SUSY
PREFILLED_SYRINGE | INTRAMUSCULAR | 0 refills | Status: DC
  Filled 2022-10-26: qty 0.3, 1d supply, fill #0

## 2022-10-27 ENCOUNTER — Other Ambulatory Visit (HOSPITAL_BASED_OUTPATIENT_CLINIC_OR_DEPARTMENT_OTHER): Payer: Self-pay

## 2022-10-31 DIAGNOSIS — L905 Scar conditions and fibrosis of skin: Secondary | ICD-10-CM | POA: Diagnosis not present

## 2022-10-31 DIAGNOSIS — R233 Spontaneous ecchymoses: Secondary | ICD-10-CM | POA: Diagnosis not present

## 2022-11-14 ENCOUNTER — Other Ambulatory Visit: Payer: Self-pay | Admitting: Internal Medicine

## 2022-11-14 NOTE — Telephone Encounter (Signed)
Last OV-07/10/22 Last refill-06/19/22--20 tabs, 0 refills  No future Ov scheduled.

## 2022-12-01 ENCOUNTER — Telehealth: Payer: Self-pay

## 2022-12-01 NOTE — Progress Notes (Signed)
Care Management & Coordination Services Pharmacy Team  Reason for Encounter: Hypertension  Contacted patient to discuss hypertension disease state. Spoke with patient on 12/01/2022     Current antihypertensive regimen:  Cardizem 30 mg q 4 hours prn breakthrough Tiazac 300 mg daily Metoprolol 25 mg 1 tablet in the AM 2 tablets in the PM  Patient verbally confirms she is taking the above medications as directed. Yes  How often are you checking your Blood Pressure? Patient states she hasn't been checking regular for a while.  She was not in Afib for 3 weeks and did not check blood pressures at that time. She states she did go back into Afib this morning and her blood pressures were 112/76 pulse 96 at 5 AM, 121/70 pulse 66 at 9 AM a little later it was 116/70 pulse 64. She states Afib has settled some today.   Wrist or arm cuff: Arm cuff  OTC medications including pseudoephedrine or NSAIDs? Patient does not use.   Any readings above 180/100? Patient denies.  What recent interventions/DTPs have been made by any provider to improve Blood Pressure control since last CPP Visit: No recent interventions noted.  Any recent hospitalizations or ED visits since last visit with CPP? No recent hospital visits noted.   What diet changes have been made to improve Blood Pressure Control?  Patient follows a plant based diet with fish and chicken, low salt and sugar Breakfast - patient will have a bran cereal with almond milk or gluten free pancakes Lunch - patient will have a bagel with cream cheese or salad Dinner - patient will have sometimes chicken or fish with a salad or vegetable Snack - mandrin oranges Caffeine intake:  Salt intake:patient watches sodium and doesn't add salt  What exercise is being done to improve your Blood Pressure Control?  Patient walks daily and physical therapy exercises daily   Adherence Review: Is the patient currently on ACE/ARB medication? No Does the patient  have >5 day gap between last estimated fill dates? No  Star Rating Drugs:  Pravastatin 20 mg - last filled 11/30/2022 90 DS at [facility]  Gaps: AWV - completed 12/05/2021, scheduled 12/06/2022 Tdap - overdue  Chart Updates:  Recent office visits:  None  Recent consult visits:  10/06/2022 Minus Breeding MD (cardiology) - Patient was seen for Elevated coronary artery calcium score and additional concerns. No medication changes.   10/05/2022 Meredith Leeds MD(cardiology) - Patient was seen for PAF and medication management. No medication changes.   09/29/2022 Philemon Kingdom MD (Int Med) - Patient was seen for Age-related osteoporosis without current pathological fracture and vit D insufficiency. Discontinued Ketoconazole.   09/20/2022 Baltazar Apo MD (pulmonary) - Patient was seen for chronic cough. No medication changes.   Hospital visits:  None  Medications: Outpatient Encounter Medications as of 12/01/2022  Medication Sig   acetaminophen (TYLENOL) 500 MG tablet Take 1,000 mg by mouth every 6 (six) hours as needed for moderate pain.   alendronate (FOSAMAX) 70 MG tablet TAKE 1 TABLET BY MOUTH  WEEKLY WITH A FULL GLASS OF WATER ON AN EMPTY STOMACH   apixaban (ELIQUIS) 5 MG TABS tablet TAKE 1 TABLET BY MOUTH TWICE  DAILY   Calcium Citrate-Vitamin D (CITRACAL + D PO) Take 325 mg by mouth in the morning and at bedtime. With 1000 units of vitamin D total   Cholecalciferol (VITAMIN D3) 1000 units CAPS Take 1,000 Units by mouth daily.   Coenzyme Q10 (COQ10) 100 MG CAPS 1 capsule daily.  COVID-19 mRNA vaccine 2023-2024 (COMIRNATY) syringe Inject into the muscle.   diltiazem (CARDIZEM) 30 MG tablet TAKE 1 TABLET EVERY 4 HOURS AS NEEDED FOR BREAKTHROUGH AFIB   diltiazem (TIAZAC) 300 MG 24 hr capsule TAKE 1 CAPSULE BY MOUTH  DAILY   dofetilide (TIKOSYN) 250 MCG capsule TAKE 1 CAPSULE BY MOUTH 2 TIMES DAILY.   influenza vaccine adjuvanted (FLUAD) 0.5 ML injection Inject into the muscle.    LORazepam (ATIVAN) 0.5 MG tablet TAKE 1 TABLET (0.5 MG TOTAL) BY MOUTH DAILY AS NEEDED FOR ANXIETY (WHEN FLYING).   metoprolol tartrate (LOPRESSOR) 25 MG tablet Take 1 tablet in the AM and 2 tablets in the PM   Multiple Vitamins-Minerals (PRESERVISION AREDS PO) Take 1 capsule by mouth in the morning and at bedtime.   potassium chloride (KLOR-CON) 10 MEQ tablet TAKE 2 TABLETS BY MOUTH DAILY   pravastatin (PRAVACHOL) 20 MG tablet Take 1 tablet (20 mg total) by mouth every evening.   RSV vaccine recomb adjuvanted (AREXVY) 120 MCG/0.5ML injection Inject into the muscle.   No facility-administered encounter medications on file as of 12/01/2022.  Fill History:   Dispensed Days Supply Quantity Provider Pharmacy  ALENDRONATE SODIUM  70 MG TABS 11/27/2022 84 12 tablet      Dispensed Days Supply Quantity Provider Pharmacy  ELIQUIS  5 MG TABS 11/27/2022 90 180 table      Dispensed Days Supply Quantity Provider Pharmacy  DILTIAZEM HYDROCHLORIDE ER  300 MG CP24 01/22/2021 90 90 capsule      Dispensed Days Supply Quantity Provider Pharmacy  DILTIAZEM HYDROCHLORIDE ER  300 MG CP24 08/05/2022 90 90 capsule      Dispensed Days Supply Quantity Provider Pharmacy  DOFETILIDE 250 MCG CAPSULE 10/17/2022 90 180 each      Dispensed Days Supply Quantity Provider Pharmacy  LORAZEPAM 0.5 MG TABLET 06/19/2022 20 20 each      Dispensed Days Supply Quantity Provider Pharmacy  METOPROLOL TARTRATE  25 MG TABS 09/28/2022 90 270 tablet      Dispensed Days Supply Quantity Provider Pharmacy  KLOR-CON M10 10 MEQ PO TBCR 09/26/2021 90 180      Dispensed Days Supply Quantity Provider Pharmacy  PRAVASTATIN SODIUM  20 MG TABS 11/30/2022 90 90 tablet     Recent Office Vitals: BP Readings from Last 3 Encounters:  10/06/22 130/60  10/05/22 134/62  09/29/22 (!) 148/88   Pulse Readings from Last 3 Encounters:  10/06/22 61  10/05/22 60  09/29/22 62    Wt Readings from Last 3 Encounters:  10/06/22 95 lb (43.1 kg)   10/05/22 94 lb 12.8 oz (43 kg)  09/29/22 95 lb 6.4 oz (43.3 kg)     Kidney Function Lab Results  Component Value Date/Time   CREATININE 0.69 10/05/2022 10:22 AM   CREATININE 0.70 04/17/2022 09:44 AM   CREATININE 0.82 09/28/2020 08:22 AM   CREATININE 0.75 11/20/2019 08:06 AM   GFR 88.98 01/11/2022 10:45 AM   GFRNONAA >60 06/24/2021 10:54 AM   GFRNONAA 74 09/28/2020 08:22 AM   GFRAA 86 09/28/2020 08:22 AM       Latest Ref Rng & Units 10/05/2022   10:22 AM 04/17/2022    9:44 AM 01/11/2022   10:45 AM  BMP  Glucose 70 - 99 mg/dL 80  70  86   BUN 8 - 27 mg/dL '22  19  22   '$ Creatinine 0.57 - 1.00 mg/dL 0.69  0.70  0.68   BUN/Creat Ratio 12 - 28 32  27  Sodium 134 - 144 mmol/L 140  141  137   Potassium 3.5 - 5.2 mmol/L 4.6  4.4  4.4   Chloride 96 - 106 mmol/L 101  100  101   CO2 20 - 29 mmol/L '25  28  31   '$ Calcium 8.7 - 10.3 mg/dL 10.6  10.2  10.5    Ord Pharmacist Assistant [phone]

## 2022-12-06 ENCOUNTER — Ambulatory Visit (INDEPENDENT_AMBULATORY_CARE_PROVIDER_SITE_OTHER): Payer: Medicare Other

## 2022-12-06 VITALS — BP 120/69 | HR 60 | Temp 98.7°F | Ht 65.0 in | Wt 95.9 lb

## 2022-12-06 DIAGNOSIS — Z0000 Encounter for general adult medical examination without abnormal findings: Secondary | ICD-10-CM | POA: Diagnosis not present

## 2022-12-06 NOTE — Progress Notes (Signed)
Subjective:   [name] [name] is a 72 y.o. female who presents for Medicare Annual (Subsequent) preventive examination.  Review of Systems      Cardiac Risk Factors include: advanced age (>69mn, >>45women)     Objective:    Today's Vitals   12/06/22 0937  BP: 120/69  Pulse: 60  Temp: 98.7 F (37.1 C)  TempSrc: Oral  SpO2: 98%  Weight: 95 lb 14.4 oz (43.5 kg)  Height: '5\' 5"'$  (1.651 m)   Body mass index is 15.96 kg/m.     12/06/2022    9:49 AM 05/16/2022    8:34 AM 12/05/2021    9:25 AM 09/05/2021    1:04 PM 06/24/2021   10:58 AM 06/01/2021   12:00 PM 02/01/2021    5:49 AM  Advanced Directives  Does Patient Have a Medical Advance Directive? Yes Yes Yes Yes No Yes Yes  Type of AParamedicof [place]Living will [place]Living will [place]Living will [place]Living will  [place]Living will [place]Living will  Does patient want to make changes to medical advance directive? No - Patient declined No - Patient declined No - Patient declined   No - Patient declined No - Patient declined  Copy of HFort [place]in Chart? Yes - validated most recent copy scanned in chart (See row information) Yes - validated most recent copy scanned in chart (See row information) No - copy requested No - copy requested  Yes - validated most recent copy scanned in chart (See row information) No - copy requested    Current Medications (verified) Outpatient Encounter Medications as of 12/06/2022  Medication Sig   acetaminophen (TYLENOL) 500 MG tablet Take 1,000 mg by mouth every 6 (six) hours as needed for moderate pain.   alendronate (FOSAMAX) 70 MG tablet TAKE 1 TABLET BY MOUTH  WEEKLY WITH A FULL GLASS OF WATER ON AN EMPTY STOMACH   apixaban (ELIQUIS) 5 MG TABS tablet TAKE 1 TABLET BY MOUTH TWICE  DAILY   Calcium Citrate-Vitamin D (CITRACAL + D PO) Take 325 mg by  mouth in the morning and at bedtime. With 1000 units of vitamin D total   Cholecalciferol (VITAMIN D3) 1000 units CAPS Take 1,000 Units by mouth daily.   Coenzyme Q10 (COQ10) 100 MG CAPS 1 capsule daily.   COVID-19 mRNA vaccine 2023-2024 (COMIRNATY) syringe Inject into the muscle.   diltiazem (CARDIZEM) 30 MG tablet TAKE 1 TABLET EVERY 4 HOURS AS NEEDED FOR BREAKTHROUGH AFIB   diltiazem (TIAZAC) 300 MG 24 hr capsule TAKE 1 CAPSULE BY MOUTH  DAILY   dofetilide (TIKOSYN) 250 MCG capsule TAKE 1 CAPSULE BY MOUTH 2 TIMES DAILY.   influenza vaccine adjuvanted (FLUAD) 0.5 ML injection Inject into the muscle.   LORazepam (ATIVAN) 0.5 MG tablet TAKE 1 TABLET (0.5 MG TOTAL) BY MOUTH DAILY AS NEEDED FOR ANXIETY (WHEN FLYING).   metoprolol tartrate (LOPRESSOR) 25 MG tablet Take 1 tablet in the AM and 2 tablets in the PM   Multiple Vitamins-Minerals (PRESERVISION AREDS PO) Take 1 capsule by mouth in the morning and at bedtime.   potassium chloride (KLOR-CON) 10 MEQ tablet TAKE 2 TABLETS BY MOUTH DAILY   pravastatin (PRAVACHOL) 20 MG tablet Take 1 tablet (20 mg total) by mouth every evening.   RSV vaccine recomb adjuvanted (AREXVY) 120 MCG/0.5ML injection Inject into the muscle.   No facility-administered encounter medications on file as of 12/06/2022.  Allergies (verified) Latex, Other, Codeine phosphate, Demerol [meperidine], Protonix [pantoprazole], Sulfa antibiotics, and Tens therapy replace back pads   History: Past Medical History:  Diagnosis Date   Allergic rhinitis    Aortic insufficiency    CAT SCRATCH 09/10/2009   Qualifier: Diagnosis of  By: Regis Bill MD, [name]    Clavicle fracture 12/2006   left   Diverticulosis    HTN (hypertension)    Osteopenia    Paroxysmal atrial fibrillation (HCC)    Pneumonia    PONV (postoperative nausea and vomiting)    Vision disturbance 03/13/2013   Negative neuro MRA MRI   Past Surgical History:  Procedure Laterality Date   ABDOMINAL HYSTERECTOMY      pt denies 04/15/14   APPENDECTOMY     ATRIAL FIBRILLATION ABLATION N/A 02/01/2021   Procedure: ATRIAL FIBRILLATION ABLATION;  Surgeon: Thompson Grayer, MD;  Location: [facility];  Service: Cardiovascular;  Laterality: N/A;   BRONCHIAL WASHINGS  09/05/2021   Procedure: BRONCHIAL WASHINGS;  Surgeon: Collene Gobble, MD;  Location: [facility];  Service: Cardiopulmonary;;   LAPAROSCOPIC SALPINGO OOPHERECTOMY Right    TONSILLECTOMY AND ADENOIDECTOMY     VIDEO BRONCHOSCOPY N/A 09/05/2021   Procedure: VIDEO BRONCHOSCOPY WITHOUT FLUORO;  Surgeon: Collene Gobble, MD;  Location: [facility];  Service: Cardiopulmonary;  Laterality: N/A;   Family History  Problem Relation Age of Onset   Scleroderma Mother    Hyperlipidemia Mother    Coronary artery disease Father 48   Esophageal cancer Paternal Grandmother    Social History   Socioeconomic History   Marital status: Married    Spouse name: Not on file   Number of children: 0   Years of education: Not on file   Highest education level: Bachelor's degree (e.g., BA, AB, BS)  Occupational History   Occupation: Veterinary surgeon   Occupation: REPORTER    Employer: [employer]  Tobacco Use   Smoking status: Never   Smokeless tobacco: Never  Substance and Sexual Activity   Alcohol use: Not Currently   Drug use: No   Sexual activity: Not on file  Other Topics Concern   Not on file  Social History Narrative   Married   hh of 2    p0g0   Works for [employer]    Social Determinants of Radio broadcast assistant Strain: [place]  (12/06/2022)   Overall Financial Resource Strain (CARDIA)    Difficulty of Paying Living Expenses: Not hard at all  Food Insecurity: No Food Insecurity (12/06/2022)   Hunger Vital Sign    Worried About Running Out of Food in the Last Year: Never true    [place] in the Last Year: Never true  Transportation Needs: No Transportation Needs (12/06/2022)   PRAPARE - Radiographer, therapeutic (Medical): No    Lack of Transportation (Non-Medical): No  Physical Activity: Insufficiently Active (12/06/2022)   Exercise Vital Sign    Days of Exercise per Week: 4 days    Minutes of Exercise per Session: 30 min  Stress: No Stress Concern Present (12/06/2022)   [place]    Feeling of Stress : Not at all  Social Connections: [place] (12/06/2022)   Social Connection and Isolation Panel [NHANES]    Frequency of Communication with Friends and Family: More than three times a week    Frequency of Social Gatherings with Friends and Family: More  than three times a week    Attends Religious Services: More than 4 times per year    Active Member of Clubs or Organizations: Yes    Attends Music therapist: More than 4 times per year    Marital Status: Married    Tobacco Counseling Counseling given: Not Answered   Clinical Intake:  Pre-visit preparation completed: No  Pain : No/denies pain     BMI - recorded: 15.96 Nutritional Status: BMI <19  Underweight Nutritional Risks: None Diabetes: No  How often do you need to have someone help you when you read instructions, pamphlets, or other written materials from your doctor or pharmacy?: 1 - Never  Diabetic?  No  Interpreter Needed?: No  Information entered by :: Rolene Arbour LPN   Activities of Daily Living    12/06/2022    9:47 AM 12/02/2022    9:06 AM  In your present state of health, do you have any difficulty performing the following activities:  Hearing? 0 0  Vision? 0 0  Difficulty concentrating or making decisions? 0 0  Walking or climbing stairs? 0 0  Dressing or bathing? 0 0  Doing errands, shopping? 0 0  Preparing Food and eating ? N N  Using the Toilet? N N  In the past six months, have you accidently leaked urine? N N  Do you have problems with loss of bowel control? N N  Managing your Medications? N N   Managing your Finances? N N  Housekeeping or managing your Housekeeping? N N    Patient Care Team: [name], [name], MD as PCP - General (Internal Medicine) Minus Breeding, MD as PCP - Cardiology (Cardiology) Thompson Grayer, MD (Inactive) as PCP - Electrophysiology (Cardiology) Clent Jacks, MD (Ophthalmology) Minus Breeding, MD as Consulting Physician (Cardiology) Viona Gilmore, [facility] (Inactive) as Pharmacist (Pharmacist)  Indicate any recent Medical Services you may have received from other than [facility] providers in the past year (date may be approximate).     Assessment:   This is a routine wellness examination for Eura.  Hearing/Vision screen Hearing Screening - Comments:: Denies hearing difficulties   Vision Screening - Comments:: Wears rx glasses - up to date with routine eye exams with  Dr Katy Fitch  Dietary issues and exercise activities discussed: Exercise limited by: None identified   Goals Addressed               This Visit's Progress     Gain weight (pt-stated)        I would like to gain a little weight.        Depression Screen    12/06/2022    9:46 AM 07/10/2022    4:30 PM 06/19/2022   11:54 AM 06/15/2022   11:10 AM 06/07/2022    8:31 AM 03/28/2022    3:14 PM 01/11/2022   10:05 AM  PHQ 2/9 Scores  PHQ - 2 Score 0 0 0 0 0 0 0  PHQ- 9 Score  0 0 0 0 1 0    Fall Risk    12/06/2022    9:48 AM 12/02/2022    9:06 AM 07/10/2022    4:31 PM 06/19/2022   11:55 AM 06/15/2022   11:09 AM  Fall Risk   Falls in the past year? 0 0 0 0 1  Number falls in past yr: 0 0 0 0 1  Injury with Fall? 0 0 0 0 0  Risk for fall due to : No Fall Risks  No Fall Risks No Fall Risks Other (Comment)  Follow up Falls prevention discussed  Falls evaluation completed Falls evaluation completed Falls evaluation completed    [place]:  Any stairs in or around the home? Yes  If so, are there any without handrails? No  Home free of loose throw rugs in  walkways, pet beds, electrical cords, etc? Yes  Adequate lighting in your home to reduce risk of falls? Yes   ASSISTIVE DEVICES UTILIZED TO PREVENT FALLS:  Life alert? No  Use of a cane, walker or w/c? No  Grab bars in the bathroom? Yes  Shower chair or bench in shower? Yes  Elevated toilet seat or a handicapped toilet? Yes   TIMED UP AND GO:  Was the test performed? Yes .  Length of time to ambulate 10 feet: 10 sec.   Gait steady and fast without use of assistive device  Cognitive Function:        12/06/2022    9:49 AM 12/05/2021    9:25 AM  6CIT Screen  What Year? 0 points 0 points  What month? 0 points 0 points  What time? 0 points 0 points  Count back from 20 0 points 0 points  Months in reverse 0 points 0 points  Repeat phrase 0 points 0 points  Total Score 0 points 0 points    Immunizations Immunization History  Administered Date(s) Administered   COVID-19, mRNA, vaccine(Comirnaty)12 years and older 10/26/2022   Fluad Quad(high Dose 65+) 09/02/2019, 08/03/2021, 09/22/2022   Influenza Split 08/25/2011, 09/03/2013, 08/03/2014   Influenza Whole 08/25/2009, 07/30/2010, 07/30/2012   Influenza, High Dose Seasonal PF 07/30/2020   Influenza,inj,Quad PF,6+ Mos 08/17/2015, 07/18/2016, 07/30/2017   Influenza-Unspecified 08/20/2018, 08/09/2019   Moderna Covid-19 Vaccine Bivalent Booster 66yr & up 09/13/2021   PFIZER(Purple Top)SARS-COV-2 Vaccination 12/05/2019, 12/30/2019, 08/17/2020, 02/25/2021   Pneumococcal Conjugate-13 03/05/2018   Pneumococcal Polysaccharide-23 02/02/2020   Pneumococcal-Unspecified 12/02/2019   Respiratory Syncytial Virus Vaccine,Recomb Aduvanted(Arexvy) 09/14/2022   Td 05/22/2007   Zoster Recombinat (Shingrix) 03/22/2018, 07/28/2018   Zoster, Live 10/02/2013    TDAP status: Due, Education has been provided regarding the importance of this vaccine. Advised may receive this vaccine at local pharmacy or Health Dept. Aware to provide a copy of the  vaccination record if obtained from local pharmacy or Health Dept. Verbalized acceptance and understanding.  Flu Vaccine status: Up to date  Pneumococcal vaccine status: Up to date  Covid-19 vaccine status: Completed vaccines  Qualifies for Shingles Vaccine? Yes   Zostavax completed Yes   Shingrix Completed?: Yes  Screening Tests Health Maintenance  Topic Date Due   DTaP/Tdap/Td (2 - Tdap) 05/21/2017   COVID-19 Vaccine (7 - 2023-24 season) 12/21/2022   Medicare Annual Wellness (AWV)  12/07/2023   COLONOSCOPY (Pts 45-461yrInsurance coverage will need to be confirmed)  04/20/2024   MAMMOGRAM  09/07/2024   Pneumonia Vaccine 6534Years old  Completed   INFLUENZA VACCINE  Completed   DEXA SCAN  Completed   Hepatitis C Screening  Completed   Zoster Vaccines- Shingrix  Completed   HPV VACCINES  Aged Out    Health Maintenance  Health Maintenance Due  Topic Date Due   DTaP/Tdap/Td (2 - Tdap) 05/21/2017    Colorectal cancer screening: Type of screening: Colonoscopy. Completed 04/20/14. Repeat every 10 years  Mammogram status: Completed 09/07/22. Repeat every year  Bone Density status: Completed 10/10/22. Results reflect: Bone density results: OSTEOPOROSIS. Repeat every   years.  Lung Cancer  Screening: (Low Dose CT Chest recommended if Age 48-80 years, 30 pack-year currently smoking OR have quit w/in 15years.) does not qualify.     Additional Screening:  Hepatitis C Screening: does qualify; Completed 09/14/16  Vision Screening: Recommended annual ophthalmology exams for early detection of glaucoma and other disorders of the eye. Is the patient up to date with their annual eye exam?  Yes  Who is the provider or what is the name of the office in which the patient attends annual eye exams? Dr Katy Fitch If pt is not established with a provider, would they like to be referred to a provider to establish care? No .   Dental Screening: Recommended annual dental exams for proper oral  hygiene  Community Resource Referral / Chronic Care Management:  CRR required this visit?  No   CCM required this visit?  No      Plan:     I have personally reviewed and noted the following in the patient's chart:   Medical and social history Use of alcohol, tobacco or illicit drugs  Current medications and supplements including opioid prescriptions. Patient is not currently taking opioid prescriptions. Functional ability and status Nutritional status Physical activity Advanced directives List of other physicians Hospitalizations, surgeries, and ER visits in previous 12 months Vitals Screenings to include cognitive, depression, and falls Referrals and appointments  In addition, I have reviewed and discussed with patient certain preventive protocols, quality metrics, and best practice recommendations. A written personalized care plan for preventive services as well as general preventive health recommendations were provided to patient.     Criselda Peaches, LPN   07/01/1193   Nurse Notes: None

## 2022-12-06 NOTE — Patient Instructions (Addendum)
[name] , Thank you for taking time to come for your Medicare Wellness Visit. I appreciate your ongoing commitment to your health goals. Please review the following plan we discussed and let me know if I can assist you in the future.   These are the goals we discussed:  Goals       Gain weight (pt-stated)      I would like to gain a little weight.         This is a list of the screening recommended for you and due dates:  Health Maintenance  Topic Date Due   DTaP/Tdap/Td vaccine (2 - Tdap) 05/21/2017   COVID-19 Vaccine (7 - 2023-24 season) 12/21/2022   Medicare Annual Wellness Visit  12/07/2023   Colon Cancer Screening  04/20/2024   Mammogram  09/07/2024   Pneumonia Vaccine  Completed   Flu Shot  Completed   DEXA scan (bone density measurement)  Completed   Hepatitis C Screening: USPSTF Recommendation to screen - Ages 43-79 yo.  Completed   Zoster (Shingles) Vaccine  Completed   HPV Vaccine  Aged Out    Advanced directives: In Chart  Conditions/risks identified: None  Next appointment: Follow up in one year for your annual wellness visit     Preventive Care 65 Years and Older, Female Preventive care refers to lifestyle choices and visits with your health care provider that can promote health and wellness. What does preventive care include? A yearly physical exam. This is also called an annual well check. Dental exams once or twice a year. Routine eye exams. Ask your health care provider how often you should have your eyes checked. Personal lifestyle choices, including: Daily care of your teeth and gums. Regular physical activity. Eating a healthy diet. Avoiding tobacco and drug use. Limiting alcohol use. Practicing safe sex. Taking low-dose aspirin every day. Taking vitamin and mineral supplements as recommended by your health care provider. What happens during an annual well check? The services and screenings done by your health care provider during your annual  well check will depend on your age, overall health, lifestyle risk factors, and family history of disease. Counseling  Your health care provider may ask you questions about your: Alcohol use. Tobacco use. Drug use. Emotional well-being. Home and relationship well-being. Sexual activity. Eating habits. History of falls. Memory and ability to understand (cognition). Work and work Statistician. Reproductive health. Screening  You may have the following tests or measurements: Height, weight, and BMI. Blood pressure. Lipid and cholesterol levels. These may be checked every 5 years, or more frequently if you are over 64 years old. Skin check. Lung cancer screening. You may have this screening every year starting at age 54 if you have a 30-pack-year history of smoking and currently smoke or have quit within the past 15 years. Fecal occult blood test (FOBT) of the stool. You may have this test every year starting at age 60. Flexible sigmoidoscopy or colonoscopy. You may have a sigmoidoscopy every 5 years or a colonoscopy every 10 years starting at age 66. Hepatitis C blood test. Hepatitis B blood test. Sexually transmitted disease (STD) testing. Diabetes screening. This is done by checking your blood sugar (glucose) after you have not eaten for a while (fasting). You may have this done every 1-3 years. Bone density scan. This is done to screen for osteoporosis. You may have this done starting at age 43. Mammogram. This may be done every 1-2 years. Talk to your health care provider about how  often you should have regular mammograms. Talk with your health care provider about your test results, treatment options, and if necessary, the need for more tests. Vaccines  Your health care provider may recommend certain vaccines, such as: Influenza vaccine. This is recommended every year. Tetanus, diphtheria, and acellular pertussis (Tdap, Td) vaccine. You may need a Td booster every 10 years. Zoster  vaccine. You may need this after age 70. Pneumococcal 13-valent conjugate (PCV13) vaccine. One dose is recommended after age 28. Pneumococcal polysaccharide (PPSV23) vaccine. One dose is recommended after age 27. Talk to your health care provider about which screenings and vaccines you need and how often you need them. This information is not intended to replace advice given to you by your health care provider. Make sure you discuss any questions you have with your health care provider. Document Released: 11/12/2015 Document Revised: 07/05/2016 Document Reviewed: 08/17/2015 Elsevier Interactive Patient Education  2017 [place] Prevention in the Home Falls can cause injuries. They can happen to people of all ages. There are many things you can do to make your home safe and to help prevent falls. What can I do on the outside of my home? Regularly fix the edges of walkways and driveways and fix any cracks. Remove anything that might make you trip as you walk through a door, such as a raised step or threshold. Trim any bushes or trees on the path to your home. Use bright outdoor lighting. Clear any walking paths of anything that might make someone trip, such as rocks or tools. Regularly check to see if handrails are loose or broken. Make sure that both sides of any steps have handrails. Any raised decks and porches should have guardrails on the edges. Have any leaves, snow, or ice cleared regularly. Use sand or salt on walking paths during winter. Clean up any spills in your garage right away. This includes oil or grease spills. What can I do in the bathroom? Use night lights. Install grab bars by the toilet and in the tub and shower. Do not use towel bars as grab bars. Use non-skid mats or decals in the tub or shower. If you need to sit down in the shower, use a plastic, non-slip stool. Keep the floor dry. Clean up any water that spills on the floor as soon as it happens. Remove  soap buildup in the tub or shower regularly. Attach bath mats securely with double-sided non-slip rug tape. Do not have throw rugs and other things on the floor that can make you trip. What can I do in the bedroom? Use night lights. Make sure that you have a light by your bed that is easy to reach. Do not use any sheets or blankets that are too big for your bed. They should not hang down onto the floor. Have a firm chair that has side arms. You can use this for support while you get dressed. Do not have throw rugs and other things on the floor that can make you trip. What can I do in the kitchen? Clean up any spills right away. Avoid walking on wet floors. Keep items that you use a lot in easy-to-reach places. If you need to reach something above you, use a strong step stool that has a grab bar. Keep electrical cords out of the way. Do not use floor polish or wax that makes floors slippery. If you must use wax, use non-skid floor wax. Do not have throw rugs and other things  on the floor that can make you trip. What can I do with my stairs? Do not leave any items on the stairs. Make sure that there are handrails on both sides of the stairs and use them. Fix handrails that are broken or loose. Make sure that handrails are as long as the stairways. Check any carpeting to make sure that it is firmly attached to the stairs. Fix any carpet that is loose or worn. Avoid having throw rugs at the top or bottom of the stairs. If you do have throw rugs, attach them to the floor with carpet tape. Make sure that you have a light switch at the top of the stairs and the bottom of the stairs. If you do not have them, ask someone to add them for you. What else can I do to help prevent falls? Wear shoes that: Do not have high heels. Have rubber bottoms. Are comfortable and fit you well. Are closed at the toe. Do not wear sandals. If you use a stepladder: Make sure that it is fully opened. Do not climb a  closed stepladder. Make sure that both sides of the stepladder are locked into place. Ask someone to hold it for you, if possible. Clearly mark and make sure that you can see: Any grab bars or handrails. First and last steps. Where the edge of each step is. Use tools that help you move around (mobility aids) if they are needed. These include: Canes. Walkers. Scooters. Crutches. Turn on the lights when you go into a dark area. Replace any light bulbs as soon as they burn out. Set up your furniture so you have a clear path. Avoid moving your furniture around. If any of your floors are uneven, fix them. If there are any pets around you, be aware of where they are. Review your medicines with your doctor. Some medicines can make you feel dizzy. This can increase your chance of falling. Ask your doctor what other things that you can do to help prevent falls. This information is not intended to replace advice given to you by your health care provider. Make sure you discuss any questions you have with your health care provider. Document Released: 08/12/2009 Document Revised: 03/23/2016 Document Reviewed: 11/20/2014 Elsevier Interactive Patient Education  2017 [employer].

## 2022-12-07 ENCOUNTER — Encounter (HOSPITAL_COMMUNITY): Payer: Self-pay | Admitting: *Deleted

## 2023-01-04 DIAGNOSIS — Z961 Presence of intraocular lens: Secondary | ICD-10-CM | POA: Diagnosis not present

## 2023-01-04 DIAGNOSIS — H04123 Dry eye syndrome of bilateral lacrimal glands: Secondary | ICD-10-CM | POA: Diagnosis not present

## 2023-01-04 DIAGNOSIS — H35371 Puckering of macula, right eye: Secondary | ICD-10-CM | POA: Diagnosis not present

## 2023-01-04 DIAGNOSIS — Z9889 Other specified postprocedural states: Secondary | ICD-10-CM | POA: Diagnosis not present

## 2023-01-04 DIAGNOSIS — H0102A Squamous blepharitis right eye, upper and lower eyelids: Secondary | ICD-10-CM | POA: Diagnosis not present

## 2023-01-04 DIAGNOSIS — H0102B Squamous blepharitis left eye, upper and lower eyelids: Secondary | ICD-10-CM | POA: Diagnosis not present

## 2023-01-04 DIAGNOSIS — H353131 Nonexudative age-related macular degeneration, bilateral, early dry stage: Secondary | ICD-10-CM | POA: Diagnosis not present

## 2023-01-28 ENCOUNTER — Other Ambulatory Visit: Payer: Self-pay | Admitting: Cardiology

## 2023-01-28 DIAGNOSIS — I480 Paroxysmal atrial fibrillation: Secondary | ICD-10-CM

## 2023-01-29 NOTE — Telephone Encounter (Signed)
Prescription refill request for Eliquis received. Indication: a fib Last office visit: 10/06/22 Scr: 0.69 epic 10/05/22 Age: 72 Weight:; 43kg

## 2023-01-31 ENCOUNTER — Other Ambulatory Visit: Payer: Self-pay | Admitting: Cardiology

## 2023-01-31 DIAGNOSIS — E785 Hyperlipidemia, unspecified: Secondary | ICD-10-CM

## 2023-02-01 DIAGNOSIS — Z85828 Personal history of other malignant neoplasm of skin: Secondary | ICD-10-CM | POA: Diagnosis not present

## 2023-02-01 DIAGNOSIS — L821 Other seborrheic keratosis: Secondary | ICD-10-CM | POA: Diagnosis not present

## 2023-02-01 DIAGNOSIS — L814 Other melanin hyperpigmentation: Secondary | ICD-10-CM | POA: Diagnosis not present

## 2023-02-01 DIAGNOSIS — L84 Corns and callosities: Secondary | ICD-10-CM | POA: Diagnosis not present

## 2023-02-01 DIAGNOSIS — D225 Melanocytic nevi of trunk: Secondary | ICD-10-CM | POA: Diagnosis not present

## 2023-02-01 DIAGNOSIS — R238 Other skin changes: Secondary | ICD-10-CM | POA: Diagnosis not present

## 2023-02-01 DIAGNOSIS — Z08 Encounter for follow-up examination after completed treatment for malignant neoplasm: Secondary | ICD-10-CM | POA: Diagnosis not present

## 2023-02-08 ENCOUNTER — Telehealth: Payer: Self-pay

## 2023-02-08 NOTE — Progress Notes (Signed)
Care Management & Coordination Services Pharmacy Note  02/09/2023 Name:  [name] [name] MRN:  [id number] DOB:  08-24-1951  Summary: Denies any signs of bleed with Eliquis and BP is well controlled Denies any falls and is adherent to alendronate  Recommendations/Changes made from today's visit: -Counseled on signs of bleed and daily monitoring of BP, counseled on wrist cuff use as patient sometimes skips BP monitoring due to not having cuff while on the go -Counseled on admin of fosamax and avoiding fall risks  Follow up plan: Pharmacist visit in 6 months   Subjective: [name] [name] is an 72 y.o. year old female who is a primary patient of [name], Neta Mends, MD.  The care coordination team was consulted for assistance with disease management and care coordination needs.    Engaged with patient face to face for follow up visit.  Recent office visits: 12/06/22 Theresa Mulligan, LPN - For AWV, no medication changes  Recent consult visits: 10/06/2022 Rollene Rotunda MD (cardiology) - Patient was seen for Elevated coronary artery calcium score and additional concerns. No medication changes.    10/05/2022 [name] MD(cardiology) - Patient was seen for PAF and medication management. No medication changes.    09/29/2022 Carlus Pavlov MD (Int Med) - Patient was seen for Age-related osteoporosis without current pathological fracture and vit D insufficiency. Discontinued Ketoconazole.    09/20/2022 Levy Pupa MD (pulmonary) - Patient was seen for chronic cough. No medication changes.  Hospital visits: None in previous 6 months   Objective:  Lab Results  Component Value Date   CREATININE 0.69 10/05/2022   BUN 22 10/05/2022   GFR 88.98 01/11/2022   EGFR 94 10/05/2022   GFRNONAA >60 06/24/2021   GFRAA 86 09/28/2020   NA 140 10/05/2022   K 4.6 10/05/2022   CALCIUM 10.6 (H) 10/05/2022   CO2 25 10/05/2022   GLUCOSE 80 10/05/2022    Lab Results  Component Value Date/Time   GFR  88.98 01/11/2022 10:45 AM   GFR 84.89 05/17/2018 09:12 AM    Last diabetic Eye exam:  Lab Results  Component Value Date/Time   HMDIABEYEEXA Normal done by Dr.Groat 12/07/2011 12:00 AM    Last diabetic Foot exam: No results found for: "HMDIABFOOTEX"   Lab Results  Component Value Date   CHOL 136 06/27/2022   HDL 72 06/27/2022   LDLCALC 51 06/27/2022   LDLDIRECT 131.0 09/03/2009   TRIG 64 06/27/2022   CHOLHDL 1.9 06/27/2022       Latest Ref Rng & Units 01/11/2022   10:45 AM 06/24/2021   10:54 AM 05/17/2018    9:12 AM  Hepatic Function  Total Protein 6.0 - 8.3 g/dL 8.2  6.6  7.4   Albumin 3.5 - 5.2 g/dL 4.1  2.8  4.5   AST 0 - 37 U/L 19  26  21    ALT 0 - 35 U/L 14  18  23    Alk Phosphatase 39 - 117 U/L 71  67  37   Total Bilirubin 0.2 - 1.2 mg/dL 0.5  1.2  0.7   Bilirubin, Direct 0.0 - 0.3 mg/dL 0.0   0.1     Lab Results  Component Value Date/Time   TSH 1.17 01/11/2022 10:45 AM   TSH 0.891 06/24/2021 12:32 PM   TSH 0.82 09/28/2020 08:22 AM   FREET4 1.00 12/08/2011 08:05 AM       Latest Ref Rng & Units 01/11/2022   10:45 AM 06/24/2021   10:54 AM 01/10/2021  7:55 AM  CBC  WBC 4.0 - 10.5 K/uL 7.6  20.5  6.0   Hemoglobin 12.0 - 15.0 g/dL 69.6  29.5  28.4   Hematocrit 36.0 - 46.0 % 40.4  41.3  40.6   Platelets 150.0 - 400.0 K/uL 312.0  303  226     Lab Results  Component Value Date/Time   VD25OH 52.0 10/05/2022 10:33 AM   VD25OH 42.31 09/29/2021 08:34 AM   VD25OH 44.3 09/28/2020 08:22 AM   VD25OH 57.64 11/20/2019 08:06 AM    Clinical ASCVD: No  The 10-year ASCVD risk score (Arnett DK, et al., 2019) is: 9.5%   Values used to calculate the score:     Age: 72 years     Sex: Female     Is Non-Hispanic African American: No     Diabetic: No     Tobacco smoker: No     Systolic Blood Pressure: 120 mmHg     Is BP treated: Yes     HDL Cholesterol: 72 mg/dL     Total Cholesterol: 136 mg/dL        11/01/2438    1:02 AM 07/10/2022    4:30 PM 06/19/2022   11:54 AM   Depression screen PHQ 2/9  Decreased Interest 0 0 0  Down, Depressed, Hopeless 0 0 0  PHQ - 2 Score 0 0 0  Altered sleeping  0 0  Tired, decreased energy  0 0  Change in appetite  0 0  Feeling bad or failure about yourself   0 0  Trouble concentrating  0 0  Moving slowly or fidgety/restless  0 0  Suicidal thoughts  0 0  PHQ-9 Score  0 0  Difficult doing work/chores  Not difficult at all Not difficult at all     Social History   Tobacco Use  Smoking Status Never  Smokeless Tobacco Never   BP Readings from Last 3 Encounters:  12/06/22 120/69  10/06/22 130/60  10/05/22 134/62   Pulse Readings from Last 3 Encounters:  12/06/22 60  10/06/22 61  10/05/22 60   Wt Readings from Last 3 Encounters:  12/06/22 95 lb 14.4 oz (43.5 kg)  10/06/22 95 lb (43.1 kg)  10/05/22 94 lb 12.8 oz (43 kg)   BMI Readings from Last 3 Encounters:  12/06/22 15.96 kg/m  10/06/22 15.81 kg/m  10/05/22 16.02 kg/m    Allergies  Allergen Reactions   Latex Hives   Other Rash and Other (See Comments)    Adhesive pads left on skin for extended periods = BURNS AND RASHES ON THE SKIN!!!!   Codeine Phosphate Other (See Comments)    Drowsiness    Demerol [Meperidine] Nausea And Vomiting   Protonix [Pantoprazole] Rash   Sulfa Antibiotics Rash   Tens Therapy Replace Back Pads Itching and Rash    Medications Reviewed Today     Reviewed by Sherrill Raring, [name] (Pharmacist) on 02/09/23 at 1142  Med List Status: <None>   Medication Order Taking? Sig Documenting Provider Last Dose Status Informant  acetaminophen (TYLENOL) 500 MG tablet [id number] No Take 1,000 mg by mouth every 6 (six) hours as needed for moderate pain. [provider] Taking Active Self  alendronate (FOSAMAX) 70 MG tablet [id number] No TAKE 1 TABLET BY MOUTH  WEEKLY WITH A FULL GLASS OF WATER ON AN EMPTY STOMACH Carlus Pavlov, MD Taking Active   apixaban (ELIQUIS) 5 MG TABS tablet [id number]  TAKE 1 TABLET BY MOUTH  TWICE  DAILY Rollene Rotunda,  MD  Active   Calcium Citrate-Vitamin D (CITRACAL + D PO) [id number] No Take 325 mg by mouth in the morning and at bedtime. With 1000 units of vitamin D total [provider] Taking Active   Cholecalciferol (VITAMIN D3) 1000 units CAPS [id number] No Take 1,000 Units by mouth daily. [provider] Taking Active   Coenzyme Q10 (COQ10) 100 MG CAPS [id number] No 1 capsule daily. [provider] Taking Active   COVID-19 mRNA vaccine [id number] (COMIRNATY) syringe [id number]  Inject into the muscle.   Active   diltiazem (CARDIZEM) 30 MG tablet [id number] No TAKE 1 TABLET EVERY 4 HOURS AS NEEDED FOR BREAKTHROUGH AFIB [name], [name], PA Taking Active   diltiazem (TIAZAC) 300 MG 24 hr capsule [id number] No TAKE 1 CAPSULE BY MOUTH  DAILY Hochrein, James, MD Taking Active   dofetilide (TIKOSYN) 250 MCG capsule [id number] No TAKE 1 CAPSULE BY MOUTH 2 TIMES DAILY. Regan Lemmingamnitz, Will Martin, MD Taking Active   influenza vaccine adjuvanted (FLUAD) 0.5 ML injection [id number] No Inject into the muscle. Judyann MunsonSnider, Cynthia, MD Taking Active   LORazepam (ATIVAN) 0.5 MG tablet [id number]  TAKE 1 TABLET (0.5 MG TOTAL) BY MOUTH DAILY AS NEEDED FOR ANXIETY (WHEN FLYING). Worthy RancherWebb, Padonda B, FNP  Active   metoprolol tartrate (LOPRESSOR) 25 MG tablet [id number] No Take 1 tablet in the AM and 2 tablets in the PM [name], [name], PA Taking Active   Multiple Vitamins-Minerals (PRESERVISION AREDS PO) [id number] No Take 1 capsule by mouth in the morning and at bedtime. [provider] Taking Active Self  potassium chloride (KLOR-CON) 10 MEQ tablet [id number] No TAKE 2 TABLETS BY MOUTH DAILY Graciella Freerillery, Michael Andrew, PA-C Taking Active   pravastatin (PRAVACHOL) 20 MG tablet [id number]  TAKE 1 TABLET BY MOUTH IN THE  Shea StakesEVENING Hochrein, James, MD  Active   RSV vaccine recomb adjuvanted (AREXVY) 120 MCG/0.5ML injection [id number] No Inject into the muscle. Judyann MunsonSnider, Cynthia, MD Taking Active              SDOH:  (Social Determinants of Health) assessments and interventions performed: Yes SDOH Interventions    Flowsheet Row Care Coordination [facility]2023 [facility] from [facility] Support from [facility]/05[facility]2021 [facility]2020 [facility] [facility]  SDOH Interventions       Food Insecurity Interventions Intervention Not Indicated Intervention Not Indicated -- Intervention Not Indicated --  Housing Interventions Intervention Not Indicated Intervention Not Indicated -- Intervention Not Indicated --  Transportation Interventions -- Intervention Not Indicated -- Intervention Not Indicated Intervention Not Indicated  Utilities Interventions -- Intervention Not Indicated -- -- --  Alcohol Usage Interventions -- Intervention Not Indicated (Score <7) -- -- --  Financial Strain Interventions -- Intervention Not Indicated Intervention Not Indicated Intervention Not Indicated Intervention Not Indicated  Physical Activity Interventions -- Intervention Not Indicated -- Intervention Not Indicated --  Stress Interventions -- Intervention Not Indicated -- Intervention Not Indicated --  Social Connections Interventions -- Intervention Not Indicated -- Intervention Not Indicated --       Medication Assistance: None required.  Patient affirms current coverage meets needs.  Medication Access: Within the past 30 days, how often has patient missed a dose of medication? None Is a pillbox or other method used to improve adherence? Yes  Factors that may affect medication  adherence? no barriers identified Are meds synced by current pharmacy? No  Are meds delivered by current pharmacy? Yes  Does patient experience delays in picking up medications  due to transportation concerns? No   [facility] Reviewed: Is patient disadvantaged to use [facility]?: Yes  Current Rx insurance plan: [facility] Name and location of Current pharmacy:  [id number] [place], [place] - [address] [place] [address] Phone: [phone] Fax: [phone]  [facility] ORDER) ELECTRONIC - [place], [place] - [address] [place] [address] Phone: [phone] Fax: [phone]  [facility] Delivery) - [place], [place] - [address] [facility] [address] [place] [address] [place] [address] Phone: [phone] Fax: [phone]  [facility] Delivery - [place], [place] - [address] [place] [address] Phone: [phone] Fax: [phone]  [facility] services reviewed with patient today?: No  Patient requests to transfer care to [facility]?: No  Reason patient declined to change pharmacies: Disadvantaged due to insurance/mail order  Compliance/Adherence/Medication fill history: Care Gaps: AWV - completed 12/06/2022, scheduled 12/17/2023 Tdap - overdue Covid - overdu  Star-Rating Drugs: Pravastatin 20 mg - last filled 11/30/2022 [facility] at [facility]    Assessment/Plan   Atrial Fibrillation (Goal: prevent stroke and major bleeding) -Controlled -CHADSVASC: 3 -Current treatment: Eliquis 5mg  BID Appropriate, Effective, Safe, Accessible Diltiazem 30mg  1 q4h prn Appropriate, Effective, Safe, Accessible Diltiazem 300mg  24hr 1 qd Appropriate, Effective, Safe, Accessible Tikosyn 1 BID Appropriate, Effective, Safe, Accessible Metoprolol tartrate 25mg  1 qam and 2 qpm Appropriate, Effective, Safe, Accessible -Medications previously tried: Flecainide -Home BP and HR readings: checking several times a day and does experience a fib at times but prn meds do keep controlled  -Counseled on  increased risk of stroke due to Afib and benefits of anticoagulation for stroke prevention; importance of adherence to anticoagulant exactly as prescribed; bleeding risk associated with Eliquis and importance of self-monitoring for signs/symptoms of bleeding; avoidance of NSAIDs due to increased bleeding risk with anticoagulants; -Recommended to continue current medication  Osteoporosis / Osteopenia (Goal Prevent bone loss and bone fracture) -Controlled -Last DEXA Scan: 10/10/22   T-Score femoral neck: LFN -2.5 -Patient is a candidate for pharmacologic treatment due to T-Score < -2.5 in femoral neck -Current treatment  Alendronate 70mg  q week Appropriate, Effective, Safe, Accessible Calcium + Vit D once daily Appropriate, Effective, Safe, Accessible Vit D 2000 units once daily Appropriate, Effective, Safe, Accessible -Medications previously tried: None  -Recommend [phone] units of vitamin D daily. Counseled on oral bisphosphonate administration: take in the morning, 30 minutes prior to food with 6-8 oz of water. Do not lie down for at least 30 minutes after taking. Recommend weight-bearing and muscle strengthening exercises for building and maintaining bone density. -Recommended to continue current medication  Sherrill Raring Clinical Pharmacist [phone]

## 2023-02-08 NOTE — Progress Notes (Signed)
Care Management & Coordination Services Pharmacy Team  Reason for Encounter: Appointment Reminder  Contacted patient to confirm in office appointment with Delano Metz, PharmD on 02/09/2023 at 11:00. Spoke with patient on 02/08/2023   Do you have any problems getting your medications?   What is your top health concern you would like to discuss at your upcoming visit?   Have you seen any other providers since your last visit with PCP?   Gaps: AWV - completed 12/06/2022, scheduled 12/17/2023 Last BP - 130/0 on 10/06/2022 Tdap - overdue Covid - overdue  Star Rating Drugs:  Pravastatin 20 mg - last filled 11/30/2022 90 DS [facility]  Clinical Pharmacist Assistant [phone]

## 2023-02-09 ENCOUNTER — Ambulatory Visit: Payer: Medicare Other

## 2023-02-12 ENCOUNTER — Telehealth: Payer: Self-pay | Admitting: Cardiology

## 2023-02-12 ENCOUNTER — Encounter: Payer: Self-pay | Admitting: Cardiology

## 2023-02-12 ENCOUNTER — Other Ambulatory Visit: Payer: Self-pay

## 2023-02-12 MED ORDER — DILTIAZEM HCL ER BEADS 300 MG PO CP24: 300.0000 mg | ORAL_CAPSULE | Freq: Every day | ORAL | 3 refills | Status: DC

## 2023-02-12 NOTE — Telephone Encounter (Signed)
Medication has been refilled and sent to her local pharmacy.  ?

## 2023-02-12 NOTE — Telephone Encounter (Signed)
Patient called back and states that this has been handled. I checked the chart and it has.

## 2023-02-12 NOTE — Telephone Encounter (Signed)
Left patient a message to give [place] a all back because she didn't state what pharmacy that she wanted her medication sent to.

## 2023-02-12 NOTE — Telephone Encounter (Signed)
*  STAT* If patient is at the pharmacy, call can be transferred to refill team.   1. Which medications need to be refilled? (please list name of each medication and dose if known) diltiazem (TIAZAC) 300 MG 24 hr capsule   2. Which pharmacy/location (including street and city if local pharmacy) is medication to be sent to? [id number] - [place], [place] - [address]   3. Do they need a 30 day or 90 day supply? 7 day

## 2023-02-12 NOTE — Telephone Encounter (Unsigned)
*  STAT* If patient is at the pharmacy, call can be transferred to refill team.   1. Which medications need to be refilled? (please list name of each medication and dose if known) diltiazem (TIAZAC) 300 MG 24 hr capsule   2. Which pharmacy/location (including street and city if local pharmacy) is medication to be sent to?***  3. Do they need a 30 day or 90 day supply? 30

## 2023-02-13 ENCOUNTER — Other Ambulatory Visit (HOSPITAL_COMMUNITY): Payer: Self-pay

## 2023-02-13 NOTE — Telephone Encounter (Signed)
Was able to speak with this patient yesterday and get everything cleared up and squared away. Please disregard this message.

## 2023-02-21 ENCOUNTER — Other Ambulatory Visit: Payer: Self-pay | Admitting: Cardiology

## 2023-02-26 ENCOUNTER — Encounter: Payer: Self-pay | Admitting: Cardiology

## 2023-03-05 ENCOUNTER — Encounter (HOSPITAL_COMMUNITY): Payer: Self-pay

## 2023-03-10 IMAGING — DX DG CHEST 1V PORT
1 series · 1 of 1 positions shown · non-contrast
Comparison: Chest x-ray 08/11/2016.

CLINICAL DATA: 69-year-old female with history of cough. COVID
positive patient.

EXAM:
PORTABLE CHEST 1 VIEW

[chest]
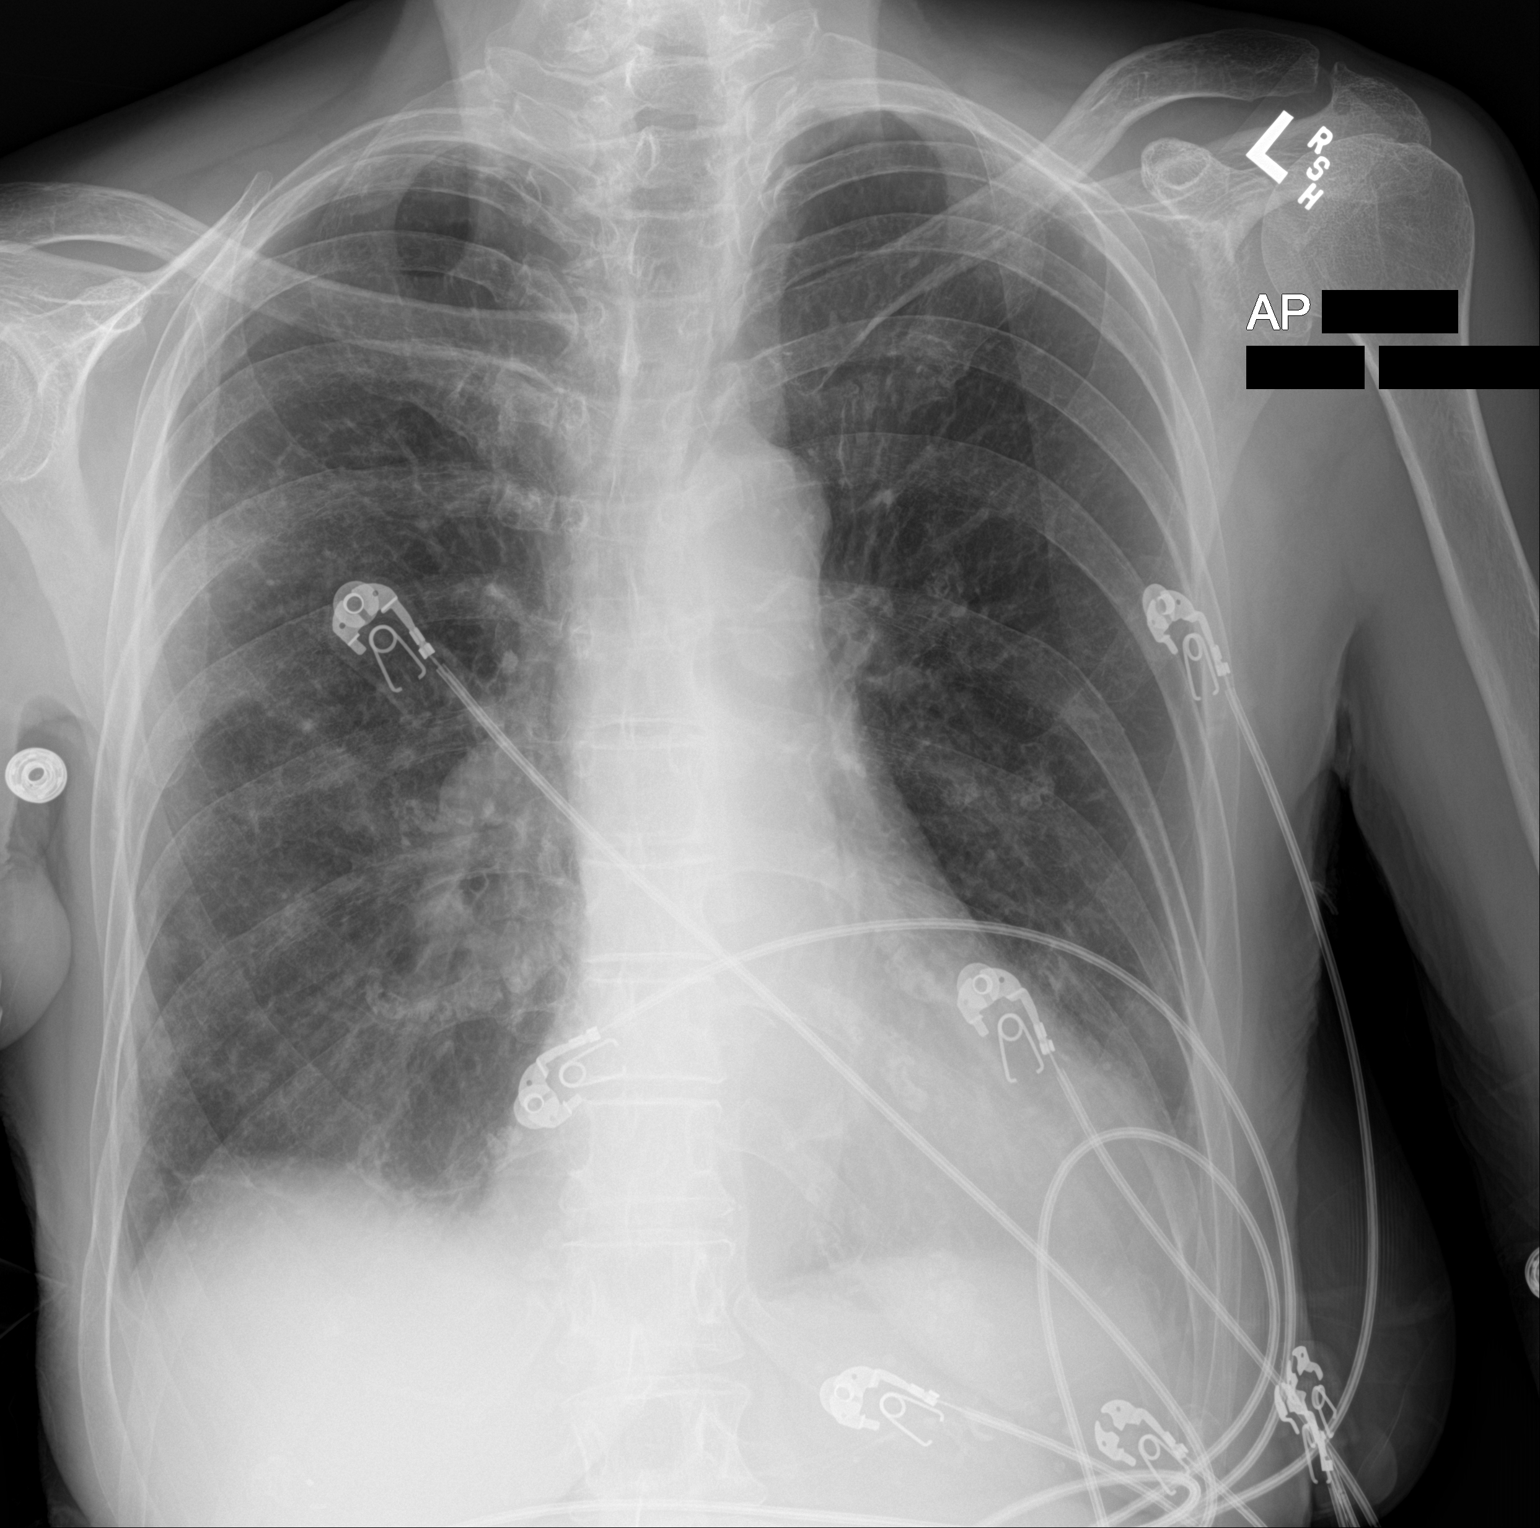

[1 of 1 positions shown; findings below may reference images not displayed]

FINDINGS: Patchy ill-defined opacities and areas of interstitial prominence
are noted in the right mid to lower lung and at the left lung base.
No confluent consolidative airspace disease. No definite pleural
effusions. No pneumothorax. No definite suspicious appearing
pulmonary nodules or masses are noted. Patchy areas of peribronchial
cuffing. No evidence of pulmonary edema. Heart size is normal. Upper
mediastinal contours are within normal limits. Atherosclerotic
calcifications in the thoracic aorta.
IMPRESSION: 1. Patchy areas of interstitial prominence an ill-defined opacities
in the lungs bilaterally (right greater than left), likely
reflective of multilobar bilateral pneumonia in the setting of known
COVID infection.
2. Aortic atherosclerosis.

## 2023-03-12 DIAGNOSIS — R208 Other disturbances of skin sensation: Secondary | ICD-10-CM | POA: Diagnosis not present

## 2023-03-12 DIAGNOSIS — L989 Disorder of the skin and subcutaneous tissue, unspecified: Secondary | ICD-10-CM | POA: Diagnosis not present

## 2023-03-12 DIAGNOSIS — D492 Neoplasm of unspecified behavior of bone, soft tissue, and skin: Secondary | ICD-10-CM | POA: Diagnosis not present

## 2023-04-02 ENCOUNTER — Other Ambulatory Visit (HOSPITAL_COMMUNITY): Payer: Medicare Other

## 2023-04-03 ENCOUNTER — Ambulatory Visit (HOSPITAL_COMMUNITY): Payer: Medicare Other | Attending: Cardiology

## 2023-04-03 DIAGNOSIS — I351 Nonrheumatic aortic (valve) insufficiency: Secondary | ICD-10-CM | POA: Diagnosis not present

## 2023-04-03 LAB — ECHOCARDIOGRAM COMPLETE
Area-P 1/2: 4.58 cm2
P 1/2 time: 483 msec
S' Lateral: 3.2 cm

## 2023-04-05 NOTE — Progress Notes (Signed)
+ Cardiology Office Note:   Date:  04/06/2023  ID:  [name] [name], DOB 04-11-1951, MRN [id number] PCP: Madelin Headings, MD  [facility] Providers Cardiologist:  Rollene Rotunda, MD Electrophysiologist:  Hillis Range, MD (Inactive)    History of Present Illness:   [name] [name] is a 72 y.o. female who for follow up of palpitations and aortic insufficiency.  She has paroxysmal atrial fibrillation. She is status post ablation.   She had Covid and she had increased fib post this.  She was started on Tikosyn.   An echo in June 2023 demonstrated a well preserved EF and moderate to severe AI unchanged from the previous echo. Ventricular size and function was normal.     She called recently with palpitations.  She has had continued paroxysms maybe not lasting quite as long as previously.  Is 4 to 5 hours.  She takes the Cardizem if her blood pressure is greater than 100 and heart rates greater than 100.  She does not feel well with it and that she has a little band of chest tightness but she is not having any presyncope or syncope.  She is not having any new shortness of breath.  The discomfort goes away if she loosens her bra.  She is staying active.  She walks for exercise.  She had a repeat echocardiogram done recently demonstrated well-preserved ejection fraction still.  Normal ventricular sizes.  The Avva still moderate.  ROS: As stated in the HPI and negative for all other systems.  Studies Reviewed:    EKG:  NA    Risk Assessment/Calculations:    CHA2DS2-VASc Score = 2   This indicates a 2.2% annual risk of stroke. The patient's score is based upon: CHF History: 0 HTN History: 0 Diabetes History: 0 Stroke History: 0 Vascular Disease History: 0 Age Score: 1 Gender Score: 1              Physical Exam:   VS:  BP 108/74   Pulse (!) 58   Ht 5\' 5"  (1.651 m)   Wt 98 lb 3.2 oz (44.5 kg)   SpO2 97%   BMI 16.34 kg/m    Wt Readings from Last 3 Encounters:  04/06/23 98 lb 3.2 oz  (44.5 kg)  12/06/22 95 lb 14.4 oz (43.5 kg)  10/06/22 95 lb (43.1 kg)     GEN: Well nourished, well developed in no acute distress NECK: No JVD; No carotid bruits CARDIAC: Irregular RR, 3 out of 6 diastolic murmur heard best at the third left intercostal spa, no systolic murmurs, rubs, gallops RESPIRATORY:  Clear to auscultation without rales, wheezing or rhonchi  ABDOMEN: Soft, non-tender, non-distended EXTREMITIES:  No edema; No deformity   ASSESSMENT AND PLAN:     Aortic insufficiency -  This is as above.  I will follow this clinically with an echo next year.   Atrial fib-  [name] [name] has a CHA2DS2 - VASc score of 2.  She is going back to see Dr.Camnitz.  She is going to discuss possibly another ablation.  HTN - The blood pressure is running low.  No change in therapy.    Elevated coronary calcium - She does have an elevated coronary calcium score noted on the CT.  This was 231 which was 86 percentile.  She had a negative stress test in 2018.       Elevated lipid - She had an excellent response to the low-dose of pravastatin.  LDL  was 51 last year.  She can get this checked by her primary provider again this year.    Aortic atherosclerosis - This is being managed with primary risk reduction.    Ascending aortic dilatation - This is 43 mm in October 2023 and I will follow-up with a contrasted CT of her aorta in October.      Signed, Rollene Rotunda, MD

## 2023-04-06 ENCOUNTER — Ambulatory Visit: Payer: Medicare Other | Attending: Cardiology | Admitting: Cardiology

## 2023-04-06 ENCOUNTER — Encounter: Payer: Self-pay | Admitting: Cardiology

## 2023-04-06 VITALS — BP 108/74 | HR 58 | Ht 65.0 in | Wt 98.2 lb

## 2023-04-06 DIAGNOSIS — I351 Nonrheumatic aortic (valve) insufficiency: Secondary | ICD-10-CM | POA: Diagnosis not present

## 2023-04-06 NOTE — Patient Instructions (Signed)
    Follow-Up: At [facility], you and your health needs are our priority.  As part of our continuing mission to provide you with exceptional heart care, we have created designated Provider Care Teams.  These Care Teams include your primary Cardiologist (physician) and Advanced Practice Providers (APPs -  Physician Assistants and Nurse Practitioners) who all work together to provide you with the care you need, when you need it.  We recommend signing up for the patient portal called "MyChart".  Sign up information is provided on this After Visit Summary.  MyChart is used to connect with patients for Virtual Visits (Telemedicine).  Patients are able to view lab/test results, encounter notes, upcoming appointments, etc.  Non-urgent messages can be sent to your provider as well.   To learn more about what you can do with MyChart, go to [email or web address].    Your next appointment:   4 month(s)  Provider:   James Hochrein, MD      

## 2023-04-07 NOTE — Progress Notes (Unsigned)
Electrophysiology Office Note   Date:  04/07/2023   ID:  [name] [name], DOB 1951/01/24, MRN [id number]  PCP:  Madelin Headings, MD  Cardiologist:  Hochrein Primary Electrophysiologist:  Claryssa Sandner [name], MD    Chief Complaint: AF   History of Present Illness: [name] [name] is a 72 y.o. female who is being seen today for the evaluation of AF at the request of [name], Neta Mends, MD. Presenting today for electrophysiology evaluation.  She has a history of hypertension and atrial fibrillation.  She was diagnosed with atrial fibrillation many years ago.  She is post ablation 02/01/2021.  During ablation she had multiple atrial flutter circuits.  She has since been loaded on dofetilide.  Today, denies symptoms of palpitations, chest pain, shortness of breath, orthopnea, PND, lower extremity edema, claudication, dizziness, presyncope, syncope, bleeding, or neurologic sequela. The patient is tolerating medications without difficulties. ***     Past Medical History:  Diagnosis Date   Allergic rhinitis    Aortic insufficiency    CAT SCRATCH 09/10/2009   Qualifier: Diagnosis of  By: Fabian Sharp MD, Neta Mends    Clavicle fracture 12/2006   left   Diverticulosis    HTN (hypertension)    Osteopenia    Paroxysmal atrial fibrillation (HCC)    Pneumonia    PONV (postoperative nausea and vomiting)    Vision disturbance 03/13/2013   Negative neuro MRA MRI   Past Surgical History:  Procedure Laterality Date   ABDOMINAL HYSTERECTOMY     pt denies 04/15/14   APPENDECTOMY     ATRIAL FIBRILLATION ABLATION N/A 02/01/2021   Procedure: ATRIAL FIBRILLATION ABLATION;  Surgeon: Hillis Range, MD;  Location: [facility];  Service: Cardiovascular;  Laterality: N/A;   BRONCHIAL WASHINGS  09/05/2021   Procedure: BRONCHIAL WASHINGS;  Surgeon: Leslye Peer, MD;  Location: [facility] ENDOS[facility];  Service: Cardiopulmonary;;   LAPAROSCOPIC SALPINGO OOPHERECTOMY Right    TONSILLECTOMY AND ADENOIDECTOMY     VIDEO  BRONCHOSCOPY N/A 09/05/2021   Procedure: VIDEO BRONCHOSCOPY WITHOUT FLUORO;  Surgeon: Leslye Peer, MD;  Location: [facility] ENDOSCOPY;  Service: Cardiopulmonary;  Laterality: N/A;     Current Outpatient Medications  Medication Sig Dispense Refill   acetaminophen (TYLENOL) 500 MG tablet Take 1,000 mg by mouth every 6 (six) hours as needed for moderate pain.     alendronate (FOSAMAX) 70 MG tablet TAKE 1 TABLET BY MOUTH  WEEKLY WITH A FULL GLASS OF WATER ON AN EMPTY STOMACH 12 tablet 3   apixaban (ELIQUIS) 5 MG TABS tablet TAKE 1 TABLET BY MOUTH TWICE  DAILY 200 tablet 0   Calcium Citrate-Vitamin D (CITRACAL + D PO) Take 325 mg by mouth in the morning and at bedtime. With 1000 units of vitamin D total     Cholecalciferol (VITAMIN D3) 1000 units CAPS Take 1,000 Units by mouth daily.     Coenzyme Q10 (COQ10) 100 MG CAPS 1 capsule daily.     COVID-19 mRNA vaccine 2023-2024 (COMIRNATY) syringe Inject into the muscle. 0.3 mL 0   diltiazem (CARDIZEM) 30 MG tablet TAKE 1 TABLET EVERY 4 HOURS AS NEEDED FOR BREAKTHROUGH AFIB 45 tablet 1   diltiazem (TIAZAC) 300 MG 24 hr capsule TAKE 1 CAPSULE BY MOUTH DAILY 90 capsule 3   dofetilide (TIKOSYN) 250 MCG capsule TAKE 1 CAPSULE BY MOUTH 2 TIMES DAILY. 180 capsule 2   influenza vaccine adjuvanted (FLUAD) 0.5 ML injection Inject into the muscle. 0.5 mL 0   LORazepam (ATIVAN) 0.5 MG  tablet TAKE 1 TABLET (0.5 MG TOTAL) BY MOUTH DAILY AS NEEDED FOR ANXIETY (WHEN FLYING). 20 tablet 0   metoprolol tartrate (LOPRESSOR) 25 MG tablet Take 1 tablet in the AM and 2 tablets in the PM 270 tablet 3   Multiple Vitamins-Minerals (PRESERVISION AREDS PO) Take 1 capsule by mouth in the morning and at bedtime.     potassium chloride (KLOR-CON) 10 MEQ tablet TAKE 2 TABLETS BY MOUTH DAILY 180 tablet 3   pravastatin (PRAVACHOL) 20 MG tablet TAKE 1 TABLET BY MOUTH IN THE  EVENING 90 tablet 3   RSV vaccine recomb adjuvanted (AREXVY) 120 MCG/0.5ML injection Inject into the muscle. 0.5 mL 0    No current facility-administered medications for this visit.    Allergies:   Latex, Other, Codeine phosphate, Demerol [meperidine], Protonix [pantoprazole], Sulfa antibiotics, and Tens therapy replace back pads   Social History:  The patient  reports that she has never smoked. She has never used smokeless tobacco. She reports that she does not currently use alcohol. She reports that she does not use drugs.   Family History:  The patient's family history includes Coronary artery disease (age of onset: 35) in her father; Esophageal cancer in her paternal grandmother; Hyperlipidemia in her mother; Scleroderma in her mother.    ROS:  Please see the history of present illness.   Otherwise, review of systems is positive for none.   All other systems are reviewed and negative.    PHYSICAL EXAM: VS:  There were no vitals taken for this visit. , BMI There is no height or weight on file to calculate BMI. GEN: Well nourished, well developed, in no acute distress  HEENT: normal  Neck: no JVD, carotid bruits, or masses Cardiac: RRR; no murmurs, rubs, or gallops,no edema  Respiratory:  clear to auscultation bilaterally, normal work of breathing GI: soft, nontender, nondistended, + BS MS: no deformity or atrophy  Skin: warm and dry Neuro:  Strength and sensation are intact Psych: euthymic mood, full affect  EKG:  EKG is ordered today. Personal review of the ekg ordered shows sinus rhythm  Recent Labs: 10/05/2022: BUN 22; Creatinine, Ser 0.69; Magnesium 2.2; Potassium 4.6; Sodium 140    Lipid Panel     Component Value Date/Time   CHOL 136 06/27/2022 0846   TRIG 64 06/27/2022 0846   HDL 72 06/27/2022 0846   CHOLHDL 1.9 06/27/2022 0846   CHOLHDL 2 01/11/2022 1045   VLDL 16.2 01/11/2022 1045   LDLCALC 51 06/27/2022 0846   LDLDIRECT 131.0 09/03/2009 0813     Wt Readings from Last 3 Encounters:  04/06/23 98 lb 3.2 oz (44.5 kg)  12/06/22 95 lb 14.4 oz (43.5 kg)  10/06/22 95 lb (43.1  kg)      Other studies Reviewed: Additional studies/ records that were reviewed today include: TTE 04/03/23  Review of the above records today demonstrates:   1. Left ventricular ejection fraction, by estimation, is 60 to 65%. The  left ventricle has normal function. The left ventricle has no regional  wall motion abnormalities. Left ventricular diastolic parameters are  indeterminate.   2. Right ventricular systolic function is normal. The right ventricular  size is normal.   3. Left atrial size was mildly dilated.   4. The mitral valve is normal in structure. Trivial mitral valve  regurgitation.   5. The aortic valve is tricuspid. Aortic valve regurgitation is moderate.  Aortic valve sclerosis is present, with no evidence of aortic valve  stenosis.  6. There is mild dilatation of the ascending aorta, measuring 42 mm.   7. The inferior vena cava is normal in size with greater than 50%  respiratory variability, suggesting right atrial pressure of 3 mmHg.    ASSESSMENT AND PLAN:  1.  Paroxysmal atrial fibrillation: Status post ablation 02/01/2021.  Has had multiple atrial flutter circuits.  Currently on dofetilide, Eliquis, metoprolol, diltiazem.  CHA2DS2-VASc of 3.***  2.  Secondary hypercoagulable state: Currently on Eliquis for atrial fibrillation  3.  High risk medication monitoring: Currently on dofetilide.  Hagan Vanauken check BMP and magnesium today.  QTc is remained stable.  4.  Hypertension:***  5.  Aortic insufficiency: Recent echo without major change.  Plan per primary cardiology.   Current medicines are reviewed at length with the patient today.   The patient does not have concerns regarding her medicines.  The following changes were made today:  ***  Labs/ tests ordered today include:  No orders of the defined types were placed in this encounter.    Disposition:   FU *** months  Signed, Rogue Rafalski [name], MD  04/07/2023 4:40 PM     [facility] [address] [place] [address] [phone] (office) [phone] (fax)

## 2023-04-09 ENCOUNTER — Other Ambulatory Visit: Payer: Self-pay | Admitting: Cardiology

## 2023-04-09 ENCOUNTER — Telehealth: Payer: Self-pay | Admitting: Cardiology

## 2023-04-09 ENCOUNTER — Other Ambulatory Visit (HOSPITAL_COMMUNITY): Payer: Self-pay | Admitting: Cardiology

## 2023-04-09 ENCOUNTER — Ambulatory Visit: Payer: Medicare Other | Attending: Cardiology | Admitting: Cardiology

## 2023-04-09 ENCOUNTER — Other Ambulatory Visit: Payer: Self-pay | Admitting: Internal Medicine

## 2023-04-09 ENCOUNTER — Encounter: Payer: Self-pay | Admitting: Cardiology

## 2023-04-09 VITALS — BP 126/60 | HR 54 | Ht 65.0 in | Wt 98.0 lb

## 2023-04-09 DIAGNOSIS — I480 Paroxysmal atrial fibrillation: Secondary | ICD-10-CM

## 2023-04-09 DIAGNOSIS — I10 Essential (primary) hypertension: Secondary | ICD-10-CM | POA: Diagnosis not present

## 2023-04-09 DIAGNOSIS — Z79899 Other long term (current) drug therapy: Secondary | ICD-10-CM

## 2023-04-09 DIAGNOSIS — D6869 Other thrombophilia: Secondary | ICD-10-CM

## 2023-04-09 MED ORDER — DOFETILIDE 250 MCG PO CAPS: 250.0000 ug | ORAL_CAPSULE | Freq: Two times a day (BID) | ORAL | 3 refills | Status: DC

## 2023-04-09 NOTE — Telephone Encounter (Signed)
Pt's medication was sent to pt's pharmacy as requested. Confirmation received.  °

## 2023-04-09 NOTE — Telephone Encounter (Signed)
Prescription refill request for Eliquis received. Indication:afib Last office visit:6/10 Scr:0.69  12/23 Age: 72 Weight:44.5  kg  Prescription refilled

## 2023-04-09 NOTE — Addendum Note (Signed)
Addended by: Margaret Pyle D on: 04/09/2023 03:41 PM   Modules accepted: Orders

## 2023-04-09 NOTE — Patient Instructions (Addendum)
Medication Instructions:  Your physician recommends that you continue on your current medications as directed. Please refer to the Current Medication list given to you today.  *If you need a refill on your cardiac medications before your next appointment, please call your pharmacy*   Lab Work: Tikosyn & Eliquis surveillance labs today:  BMET, CBC & Magnesium If you have labs (blood work) drawn today and your tests are completely normal, you will receive your results only by: MyChart Message (if you have MyChart) OR A paper copy in the mail If you have any lab test that is abnormal or we need to change your treatment, we will call you to review the results.   Testing/Procedures: None ordered   Follow-Up: At [facility], you and your health needs are our priority.  As part of our continuing mission to provide you with exceptional heart care, we have created designated Provider Care Teams.  These Care Teams include your primary Cardiologist (physician) and Advanced Practice Providers (APPs -  Physician Assistants and Nurse Practitioners) who all work together to provide you with the care you need, when you need it.  Your next appointment:   6 month(s)  The format for your next appointment:   In Person  Provider:   You may see Will [name], MD or one of the following Advanced Practice Providers on your designated Care Team:   Francis Dowse, [place] Casimiro Needle "Mardelle Matte" Lanna Poche, [place]  Thank you for choosing [facility]!!   Dory Horn, RN [phone]  Other Instructions  Amiodarone Tablets What is this medication? AMIODARONE (a MEE oh da rone) prevents and treats a fast or irregular heartbeat (arrhythmia). It works by slowing down overactive electric signals in the heart, which stabilizes your heart rhythm. It belongs to a group of medications called antiarrhythmics. This medicine may be used for other purposes; ask your health care provider or pharmacist if you have  questions. COMMON BRAND NAME(S): Cordarone, Pacerone What should I tell my care team before I take this medication? They need to know if you have any of these conditions: Liver disease Lung disease Other heart problems Thyroid disease An unusual or allergic reaction to amiodarone, iodine, other medications, foods, dyes, or preservatives Pregnant or trying to get pregnant Breast-feeding How should I use this medication? Take this medication by mouth with water. Take it as directed on the prescription label at the same time every day. You can take it with or without food. You should always take it the same way. Keep taking it unless your care team tells you to stop. A special MedGuide will be given to you by the pharmacist with each prescription and refill. Be sure to read this information carefully each time. Talk to your care team about the use of this medication in children. Special care may be needed. Overdosage: If you think you have taken too much of this medicine contact a poison control center or emergency room at once. NOTE: This medicine is only for you. Do not share this medicine with others. What if I miss a dose? If you miss a dose, take it as soon as you can. If it is almost time for your next dose, take only that dose. Do not take double or extra doses. What may interact with this medication? Do not take this medication with any of the following: Abarelix Apomorphine Arsenic trioxide Certain antibiotics, such as erythromycin, gemifloxacin, levofloxacin, or pentamidine Certain medications for depression, such as amoxapine or tricyclic antidepressants Certain medications  for fungal infections, such as fluconazole, itraconazole, ketoconazole, posaconazole, or voriconazole Certain medications for irregular heartbeat, such as disopyramide, dronedarone, ibutilide, propafenone, or sotalol Certain medications for malaria, such as chloroquine or  halofantrine Cisapride Droperidol Haloperidol [place]orn Maprotiline Methadone Phenothiazines, such as chlorpromazine, mesoridazine, or thioridazine Pimozide Ranolazine Red yeast rice Vardenafil This medication may also interact with the following: Antivirals for HIV Certain medications for blood pressure, heart disease, irregular heartbeat Certain medications for cholesterol, such as atorvastatin, cerivastatin, lovastatin, or simvastatin Certain medications for hepatitis C, such as sofosbuvir and ledipasvir; sofosbuvir Certain medications for seizures, such as phenytoin Certain medications for thyroid problems Certain medications that prevent or treat blood clots, such as warfarin Cholestyramine Cimetidine Clopidogrel Cyclosporine Dextromethorphan Diuretics Dofetilide Fentanyl General anesthetics Grapefruit juice Lidocaine Loratadine Methotrexate Other medications that cause heart rhythm changes Procainamide Quinidine Rifabutin, rifampin, or rifapentine St. John's Wort Trazodone Ziprasidone This list may not describe all possible interactions. Give your health care provider a list of all the medicines, herbs, non-prescription drugs, or dietary supplements you use. Also tell them if you smoke, drink alcohol, or use illegal drugs. Some items may interact with your medicine. What should I watch for while using this medication? Your condition will be monitored closely when you first begin therapy. This medication is often started in a hospital or other monitored health care setting. Once you are on maintenance therapy, visit your care team for regular checks on your progress. Because your condition and use of this medication carry some risk, it is a good idea to carry an identification card, necklace, or bracelet with details of your condition, medications, and care team. This medication may affect your coordination, reaction time, or judgment. Do not drive or operate machinery  until you know how this medication affects you. Sit up or stand slowly to reduce the risk of dizzy or fainting spells. Drinking alcohol with this medication can increase the risk of these side effects. This medication can make you more sensitive to the sun. Keep out of the sun. If you cannot avoid being in the sun, wear protective clothing and sunscreen. Do not use sun lamps, tanning beds, or tanning booths. You should have regular eye exams before and during treatment. Call your care team if you have blurred vision, see halos, or your eyes become sensitive to light. Your eyes may get dry. It may be helpful to use a lubricating eye solution or artificial tears solution. If you are going to have surgery or a procedure that requires contrast dyes, tell your care team that you are taking this medication. What side effects may I notice from receiving this medication? Side effects that you should report to your care team as soon as possible: Allergic reactions--skin rash, itching, hives, swelling of the face, lips, tongue, or throat Bluish-gray skin Change in vision such as blurry vision, seeing halos around lights, vision loss Heart failure--shortness of breath, swelling of the ankles, feet, or hands, sudden weight gain, unusual weakness or fatigue Heart rhythm changes--fast or irregular heartbeat, dizziness, feeling faint or lightheaded, chest pain, trouble breathing High thyroid levels (hyperthyroidism)--fast or irregular heartbeat, weight loss, excessive sweating or sensitivity to heat, tremors or shaking, anxiety, nervousness, irregular menstrual cycle or spotting Liver injury--right upper belly pain, loss of appetite, nausea, light-colored stool, dark yellow or brown urine, yellowing skin or eyes, unusual weakness or fatigue Low thyroid levels (hypothyroidism)--unusual weakness or fatigue, sensitivity to cold, constipation, hair loss, dry skin, weight gain, feelings of depression Lung  injury--shortness of breath  or trouble breathing, cough, spitting up blood, chest pain, fever Pain, tingling, or numbness in the hands or feet, muscle weakness, trouble walking, loss of balance or coordination Side effects that usually do not require medical attention (report to your care team if they continue or are bothersome): Nausea Vomiting This list may not describe all possible side effects. Call your doctor for medical advice about side effects. You may report side effects to FDA at 1-800-FDA-1088. Where should I keep my medication? Keep out of the reach of children and pets. Store at room temperature between 20 and 25 degrees C (68 and 77 degrees F). Protect from light. Keep container tightly closed. Throw away any unused medication after the expiration date. NOTE: This sheet is a summary. It may not cover all possible information. If you have questions about this medicine, talk to your doctor, pharmacist, or health care provider.  2024 Elsevier/Gold Standard (2022-05-05 00:00:00)    Cardiac Ablation Cardiac ablation is a procedure to destroy (ablate) heart tissue that is sending bad signals. These bad signals cause the heart to beat very fast or in a way that is not normal. Destroying some tissues can help make the heart rhythm normal. Tell your doctor about: Any allergies you have. All medicines you are taking. These include vitamins, herbs, eye drops, creams, and over-the-counter medicines. Any problems you or family members have had with anesthesia. Any bleeding problems you have. Any surgeries you have had. Any medical conditions you have. Whether you are pregnant or may be pregnant. What are the risks? Your doctor will talk with you about risks. These may include: Infection. Bruising and bleeding. Stroke or blood clots. Damage to nearby areas of your body. Allergies to medicines or dyes. Needing a pacemaker if the heart gets damaged. A pacemaker helps the heart beat  normally. The procedure not working. What happens before the procedure? Medicines Ask your doctor about changing or stopping: Your normal medicines. Vitamins, herbs, and supplements. Over-the-counter medicines. Do not take aspirin or ibuprofen unless you are told to. General instructions Follow instructions from your doctor about what you may eat and drink. If you will be going home right after the procedure, plan to have a responsible adult: Take you home from the hospital or clinic. You will not be allowed to drive. Care for you for the time you are told. Ask your doctor what steps will be taken to prevent the spread of germs. What happens during the procedure?  An IV tube will be put into one of your veins. You may be given: A sedative. This helps you relax. Anesthesia. This will: Numb certain areas of your body. The skin on your neck or groin will be numbed. A cut (incision) will be made in your neck or groin. A needle will be put through the cut and into a large vein. The small, thin tube (catheter) will be put into the needle. The tube will be moved to your heart. A type of X-ray (fluoroscopy) will be used to help guide the tube. It will also show constant images of the heart on a screen. Dye may be put through the tube. This helps your doctor see your heart. An electric current will be sent from the tube to destroy heart tissue in certain areas. The tube will be taken out. Pressure will be held on your cut. This helps stop bleeding. A bandage (dressing) will be put over your cut. The procedure may vary among doctors and hospitals. What happens after the  procedure? You will be monitored until you leave the hospital or clinic. This includes checking your blood pressure, heart rate and rhythm, breathing rate, and blood oxygen level. Your cut will be checked for bleeding. You will need to lie still for a few hours. If your groin was used, you will need to keep your leg straight  for a few hours after the small, thin tube is removed. This information is not intended to replace advice given to you by your health care provider. Make sure you discuss any questions you have with your health care provider. Document Revised: 04/04/2022 Document Reviewed: 04/04/2022 Elsevier Patient Education  2024 [employer].

## 2023-04-09 NOTE — Telephone Encounter (Signed)
*  STAT* If patient is at the pharmacy, call can be transferred to refill team.   1. Which medications need to be refilled? (please list name of each medication and dose if known) dofetilide (TIKOSYN) 250 MCG capsule   2. Which pharmacy/location (including street and city if local pharmacy) is medication to be sent to? [id number] - [place], [place] - [address]   3. Do they need a 30 day or 90 day supply? 90  Patient is almost out of this medication

## 2023-04-10 LAB — BASIC METABOLIC PANEL
BUN/Creatinine Ratio: 34 — ABNORMAL HIGH (ref 12–28)
BUN: 21 mg/dL (ref 8–27)
CO2: 26 mmol/L (ref 20–29)
Calcium: 9.9 mg/dL (ref 8.7–10.3)
Chloride: 104 mmol/L (ref 96–106)
Creatinine, Ser: 0.61 mg/dL (ref 0.57–1.00)
Glucose: 90 mg/dL (ref 70–99)
Potassium: 4.6 mmol/L (ref 3.5–5.2)
Sodium: 142 mmol/L (ref 134–144)
eGFR: 96 mL/min/{1.73_m2} (ref >59)

## 2023-04-10 LAB — MAGNESIUM: Magnesium: 2.2 mg/dL (ref 1.6–2.3)

## 2023-04-10 LAB — CBC
Hematocrit: 40.3 % (ref 34.0–46.6)
Hemoglobin: 13.7 g/dL (ref 11.1–15.9)
MCH: 32.9 pg (ref 26.6–33.0)
MCHC: 34 g/dL (ref 31.5–35.7)
MCV: 97 fL (ref 79–97)
Platelets: 268 10*3/uL (ref 150–450)
RBC: 4.17 x10E6/uL (ref 3.77–5.28)
RDW: 11.7 % (ref 11.7–15.4)
WBC: 8.6 10*3/uL (ref 3.4–10.8)

## 2023-04-13 ENCOUNTER — Encounter: Payer: Self-pay | Admitting: Cardiology

## 2023-04-16 ENCOUNTER — Encounter: Payer: Self-pay | Admitting: Internal Medicine

## 2023-04-16 DIAGNOSIS — Z79899 Other long term (current) drug therapy: Secondary | ICD-10-CM

## 2023-04-23 NOTE — Telephone Encounter (Signed)
Bun can commonly be elevated in dehydration and excess protein intake . With normal kidney function. Can't say for sure for you  but if you want  we can follo wup :you can make sure not taking protein supplement and be well hydrated and repeat the  BMP and order urine microalbumin (  checking for  kidney function)     Then have visit virtual or other to review findings

## 2023-04-25 NOTE — Telephone Encounter (Signed)
Get lab bmp well  hydrated  non fasting ok .  can go back to high protein if needed but for now no extra protein in the interim  and then make appt to discuss results .

## 2023-04-26 ENCOUNTER — Other Ambulatory Visit (INDEPENDENT_AMBULATORY_CARE_PROVIDER_SITE_OTHER): Payer: Medicare Other

## 2023-04-26 DIAGNOSIS — Z79899 Other long term (current) drug therapy: Secondary | ICD-10-CM

## 2023-04-26 LAB — BASIC METABOLIC PANEL
BUN: 21 mg/dL (ref 6–23)
CO2: 29 mEq/L (ref 19–32)
Calcium: 10.7 mg/dL — ABNORMAL HIGH (ref 8.4–10.5)
Chloride: 102 mEq/L (ref 96–112)
Creatinine, Ser: 0.68 mg/dL (ref 0.40–1.20)
GFR: 88.18 mL/min (ref >60.00)
Glucose, Bld: 104 mg/dL — ABNORMAL HIGH (ref 70–99)
Potassium: 3.8 mEq/L (ref 3.5–5.1)
Sodium: 140 mEq/L (ref 135–145)

## 2023-04-30 DIAGNOSIS — B079 Viral wart, unspecified: Secondary | ICD-10-CM | POA: Diagnosis not present

## 2023-04-30 DIAGNOSIS — L821 Other seborrheic keratosis: Secondary | ICD-10-CM | POA: Diagnosis not present

## 2023-04-30 DIAGNOSIS — D492 Neoplasm of unspecified behavior of bone, soft tissue, and skin: Secondary | ICD-10-CM | POA: Diagnosis not present

## 2023-05-02 NOTE — Progress Notes (Signed)
Kidney function is the same stable . Nl function gfr.  Calcium level is borderline up .  May not be significant . Can follow  Will forward to dr Elvera Lennox   for her input .

## 2023-05-07 NOTE — Telephone Encounter (Signed)
Nl is normal , gfr is glomerular filtration rate   ( a  calculated measure of kidney function) .

## 2023-05-15 DIAGNOSIS — Z681 Body mass index (BMI) 19 or less, adult: Secondary | ICD-10-CM | POA: Diagnosis not present

## 2023-05-16 ENCOUNTER — Encounter: Payer: Self-pay | Admitting: Cardiology

## 2023-05-19 ENCOUNTER — Other Ambulatory Visit (HOSPITAL_COMMUNITY): Payer: Self-pay | Admitting: Physician Assistant

## 2023-05-22 ENCOUNTER — Encounter: Payer: Self-pay | Admitting: Internal Medicine

## 2023-05-22 DIAGNOSIS — C44722 Squamous cell carcinoma of skin of right lower limb, including hip: Secondary | ICD-10-CM | POA: Diagnosis not present

## 2023-05-22 DIAGNOSIS — L821 Other seborrheic keratosis: Secondary | ICD-10-CM | POA: Diagnosis not present

## 2023-05-28 ENCOUNTER — Other Ambulatory Visit: Payer: Self-pay | Admitting: Student

## 2023-05-29 DIAGNOSIS — S91104A Unspecified open wound of right lesser toe(s) without damage to nail, initial encounter: Secondary | ICD-10-CM | POA: Diagnosis not present

## 2023-06-01 ENCOUNTER — Other Ambulatory Visit (HOSPITAL_COMMUNITY): Payer: Self-pay | Admitting: Physician Assistant

## 2023-06-05 ENCOUNTER — Telehealth: Payer: Self-pay | Admitting: Pharmacist

## 2023-06-05 NOTE — Telephone Encounter (Signed)
Patient called to see if recently prescribed medication would be safe to take along her heart medication.  She has been prescribed doxycycline for skin infection. Which does not interact with any of her heart medication medication (Apixaban, diltiazem, metoprolol, Tikosyn, metoprolol, pravastatin) however it reduces effect of Fosamax and bivalents -calcium may affect doxycycline. Advised patient to space these fosamax and Calcium supplement apart from doxy doses.

## 2023-06-25 ENCOUNTER — Encounter: Payer: Self-pay | Admitting: Internal Medicine

## 2023-06-25 DIAGNOSIS — S91104A Unspecified open wound of right lesser toe(s) without damage to nail, initial encounter: Secondary | ICD-10-CM | POA: Diagnosis not present

## 2023-06-28 ENCOUNTER — Encounter (HOSPITAL_COMMUNITY): Payer: Self-pay | Admitting: Physician Assistant

## 2023-06-28 ENCOUNTER — Ambulatory Visit (HOSPITAL_COMMUNITY)
Admission: RE | Admit: 2023-06-28 | Discharge: 2023-06-28 | Disposition: A | Discharge status: Home / Self Care | Payer: Medicare Other | Source: Ambulatory Visit | Attending: Physician Assistant | Admitting: Physician Assistant

## 2023-06-28 VITALS — BP 104/82 | HR 63 | Ht 65.0 in | Wt 97.8 lb

## 2023-06-28 DIAGNOSIS — I351 Nonrheumatic aortic (valve) insufficiency: Secondary | ICD-10-CM | POA: Insufficient documentation

## 2023-06-28 DIAGNOSIS — Z79899 Other long term (current) drug therapy: Secondary | ICD-10-CM | POA: Diagnosis not present

## 2023-06-28 DIAGNOSIS — I480 Paroxysmal atrial fibrillation: Secondary | ICD-10-CM

## 2023-06-28 DIAGNOSIS — D6869 Other thrombophilia: Secondary | ICD-10-CM

## 2023-06-28 DIAGNOSIS — Z5181 Encounter for therapeutic drug level monitoring: Secondary | ICD-10-CM | POA: Diagnosis not present

## 2023-06-28 DIAGNOSIS — I10 Essential (primary) hypertension: Secondary | ICD-10-CM | POA: Diagnosis not present

## 2023-06-28 DIAGNOSIS — Z7901 Long term (current) use of anticoagulants: Secondary | ICD-10-CM | POA: Insufficient documentation

## 2023-06-28 NOTE — Progress Notes (Signed)
Primary Care Physician: Madelin Headings, MD Referring Physician: Dr. Antoine Poche EP: Dr. Renae Fickle is a 72 y.o. female with a h/o HTN, paroxysmal afib, initially diagnosed with afib for several years. She is on rate control/flecainde  meds as well as eliquis 5 mg for a chadsvasc score  of 3. She is in the afib clinic to f/u ablation one month ago. She did have some episodes the second week of ablation but sine 18th of April she has not had anymore episodes. She did have an allergic reaction on her torso, probaly form the pads that were placed on chest for cardioversion. It has resolved. No swallowing or groin issues. She is back to her usual activities.    Follow up in the AF clinic 05/31/21. Patient presents for dofetilide admission. She reports that she has had afib about every other day. She has stopped flecainide 3 days ago. She denies any missed doses of anticoagulation.   Follow up in the AF clinic 05/31/21. Patient is s/p dofetilide admission 8/2-06/03/21. Patient reports she has not had any afib since starting the medication which is an improvement for her. She denies any bleeding issues on anticoagulation.   Follow up in the AF clinic 06/28/23. Patient reports that she has been having more frequent afib episodes, sometimes multiple episodes per day. There does not appear to be a specific trigger for these episodes. She keeps a log with her Kardia mobile device. She has symptoms of palpitations and fatigue.  Today, she denies symptoms of chest pain, shortness of breath, orthopnea, PND, lower extremity edema, dizziness, presyncope, syncope, or neurologic sequela. The patient is tolerating medications without difficulties and is otherwise without complaint today.   Past Medical History:  Diagnosis Date   Allergic rhinitis    Aortic insufficiency    CAT SCRATCH 09/10/2009   Qualifier: Diagnosis of  By: Fabian Sharp MD, Neta Mends    Clavicle fracture 12/2006   left   Diverticulosis    HTN  (hypertension)    Osteopenia    Paroxysmal atrial fibrillation (HCC)    Pneumonia    PONV (postoperative nausea and vomiting)    Vision disturbance 03/13/2013   Negative neuro MRA MRI    Current Outpatient Medications  Medication Sig Dispense Refill   acetaminophen (TYLENOL) 500 MG tablet Take 1,000 mg by mouth every 6 (six) hours as needed for moderate pain.     alendronate (FOSAMAX) 70 MG tablet TAKE 1 TABLET BY MOUTH WEEKLY  WITH A FULL GLASS OF WATER ON AN EMPTY STOMACH 12 tablet 3   apixaban (ELIQUIS) 5 MG TABS tablet TAKE 1 TABLET BY MOUTH TWICE  DAILY 200 tablet 1   Cholecalciferol (VITAMIN D3) 1000 units CAPS Take 1,000 Units by mouth daily.     Coenzyme Q10 (COQ10) 100 MG CAPS 1 capsule daily.     COVID-19 mRNA vaccine 2023-2024 (COMIRNATY) syringe Inject into the muscle. 0.3 mL 0   diltiazem (CARDIZEM) 30 MG tablet TAKE 1 TABLET EVERY 4 HOURS AS NEEDED FOR BREAKTHROUGH AFIB 45 tablet 1   diltiazem (TIAZAC) 300 MG 24 hr capsule TAKE 1 CAPSULE BY MOUTH DAILY 90 capsule 3   dofetilide (TIKOSYN) 250 MCG capsule Take 1 capsule (250 mcg total) by mouth 2 (two) times daily. 180 capsule 3   influenza vaccine adjuvanted (FLUAD) 0.5 ML injection Inject into the muscle. 0.5 mL 0   LORazepam (ATIVAN) 0.5 MG tablet TAKE 1 TABLET (0.5 MG TOTAL) BY MOUTH DAILY AS  NEEDED FOR ANXIETY (WHEN FLYING). 20 tablet 0   metoprolol tartrate (LOPRESSOR) 25 MG tablet TAKE 1 TABLET BY MOUTH IN THE  MORNING AND 2 TABLETS IN THE  EVENING 240 tablet 3   Multiple Vitamins-Minerals (PRESERVISION AREDS PO) Take 1 capsule by mouth in the morning and at bedtime.     potassium chloride (KLOR-CON) 10 MEQ tablet TAKE 2 TABLETS BY MOUTH DAILY 200 tablet 2   pravastatin (PRAVACHOL) 20 MG tablet TAKE 1 TABLET BY MOUTH IN THE  EVENING 90 tablet 3   RSV vaccine recomb adjuvanted (AREXVY) 120 MCG/0.5ML injection Inject into the muscle. 0.5 mL 0   No current facility-administered medications for this encounter.    ROS-  All systems are reviewed and negative except as per the HPI above  Physical Exam: Vitals:   06/28/23 1456  BP: 104/82  Pulse: 63  Weight: 44.4 kg  Height: 5\' 5"  (1.651 m)     Wt Readings from Last 3 Encounters:  06/28/23 44.4 kg  04/09/23 44.5 kg  04/06/23 44.5 kg    GEN: Well nourished, well developed in no acute distress NECK: No JVD; No carotid bruits CARDIAC: Regular rate and rhythm with occasional ectopy, no murmurs, rubs, gallops RESPIRATORY:  Clear to auscultation without rales, wheezing or rhonchi  ABDOMEN: Soft, non-tender, non-distended EXTREMITIES:  No edema; No deformity    EKG today demonstrates SR, PACs Vent. rate 63 BPM PR interval 180 ms QRS duration 84 ms QT/QTcB 446/456 ms   Echo 04/03/23  1. Left ventricular ejection fraction, by estimation, is 60 to 65%. The  left ventricle has normal function. The left ventricle has no regional  wall motion abnormalities. Left ventricular diastolic parameters are  indeterminate.   2. Right ventricular systolic function is normal. The right ventricular  size is normal.   3. Left atrial size was mildly dilated.   4. The mitral valve is normal in structure. Trivial mitral valve  regurgitation.   5. The aortic valve is tricuspid. Aortic valve regurgitation is moderate.  Aortic valve sclerosis is present, with no evidence of aortic valve  stenosis.   6. There is mild dilatation of the ascending aorta, measuring 42 mm.   7. The inferior vena cava is normal in size with greater than 50%  respiratory variability, suggesting right atrial pressure of 3 mmHg.   CHA2DS2-VASc Score = 3  The patient's score is based upon: CHF History: 0 HTN History: 1 Diabetes History: 0 Stroke History: 0 Vascular Disease History: 0 Age Score: 1 Gender Score: 1       ASSESSMENT AND PLAN: Paroxysmal Atrial Fibrillation (ICD10:  I48.0) The patient's CHA2DS2-VASc score is 3, indicating a 3.2% annual risk of stroke.   S/p ablation  02/01/21, multiple atrial flutter circuits noted. S/p dofetilide admission 8/2-06/03/21. Patient is having more frequent episodes recently. We discussed rhythm control options including repeat ablation, amiodarone, and convergent. She is agreeable to discussing possible repeat ablation with Dr Elberta Fortis, will request sooner appointment.  Continue dofetilide 250 mcg BID. QT stable. Recent bmet and magnesium reviewed Continue Eliquis 5 mg BID Continue metoprolol 25 mg AM and 50 mg PM Continue diltiazem 300 mg daily with 30 mg PRN q 4 hours for heart racing.  Secondary Hypercoagulable State (ICD10:  D68.69) The patient is at significant risk for stroke/thromboembolism based upon her CHA2DS2-VASc Score of 3.  Continue Apixaban (Eliquis).   Valvular heart disease Moderate AR Followed with serial echos  HTN Stable on current regimen   Follow up  with Dr Elberta Fortis to discuss possible ablation.    [name] PA-C [facility] [address] [place], [place] [address] [phone]

## 2023-06-29 ENCOUNTER — Other Ambulatory Visit (HOSPITAL_COMMUNITY): Payer: Self-pay | Admitting: *Deleted

## 2023-06-29 DIAGNOSIS — I480 Paroxysmal atrial fibrillation: Secondary | ICD-10-CM

## 2023-07-09 NOTE — Telephone Encounter (Signed)
Hi [name] [name] , Apologies  again late reply.  I dont have  a given name for electrophysiology consult. But  I often think a  [facility]  is [facility] . [facility]   In interim, suggest ask your cardiology team for  advice on intervention since  you are having a lot of symptoms ,and also  second opinion referrals.

## 2023-07-10 ENCOUNTER — Encounter: Payer: Self-pay | Admitting: Cardiology

## 2023-07-13 ENCOUNTER — Ambulatory Visit (INDEPENDENT_AMBULATORY_CARE_PROVIDER_SITE_OTHER): Payer: Medicare Other

## 2023-07-13 DIAGNOSIS — Z23 Encounter for immunization: Secondary | ICD-10-CM

## 2023-07-16 ENCOUNTER — Telehealth: Payer: Self-pay

## 2023-07-16 NOTE — Progress Notes (Signed)
07/16/2023  Patient ID: Wilford Sports, female   DOB: 02-10-1951, 72 y.o.   MRN: [id number]  Patient calling to inquire about appointment that was previously scheduled for next month with Delano Metz, PharmD.  I informed her that Marylene Land is now a member of the [facility] Pharmacist team and will continue to cover [place] starting soon.  Forwarding note to Marylene Land to call patient to reschedule appointment.  Lenna Gilford, PharmD, DPLA

## 2023-07-17 ENCOUNTER — Telehealth: Payer: Self-pay

## 2023-07-17 DIAGNOSIS — I480 Paroxysmal atrial fibrillation: Secondary | ICD-10-CM

## 2023-07-17 NOTE — Telephone Encounter (Signed)
Pt is scheduled to see Dr Elberta Fortis on 10/22 to discuss repeat Ablation. I called and she is scheduled on 11/4 for her Ablation at 7:30 AM.   If she decides on 10/22 after speaking with Dr. Elberta Fortis that she wants to proceed with the procedure she will get labs done that day.  Her CT is scheduled on 10/25 at 11:00 AM. I have mailed instruction letters to her per her request.

## 2023-07-18 NOTE — Telephone Encounter (Signed)
Contacted patient to r/s, left voice message to return call

## 2023-08-01 ENCOUNTER — Other Ambulatory Visit (HOSPITAL_BASED_OUTPATIENT_CLINIC_OR_DEPARTMENT_OTHER): Payer: Self-pay

## 2023-08-01 MED ORDER — COVID-19 MRNA VAC-TRIS(PFIZER) 30 MCG/0.3ML IM SUSY
0.3000 mL | PREFILLED_SYRINGE | Freq: Once | INTRAMUSCULAR | 0 refills | Status: AC
  Filled 2023-08-01: qty 0.3, 1d supply, fill #0

## 2023-08-06 ENCOUNTER — Ambulatory Visit: Payer: Medicare Other

## 2023-08-06 DIAGNOSIS — I480 Paroxysmal atrial fibrillation: Secondary | ICD-10-CM | POA: Diagnosis not present

## 2023-08-06 DIAGNOSIS — M810 Age-related osteoporosis without current pathological fracture: Secondary | ICD-10-CM | POA: Diagnosis not present

## 2023-08-06 NOTE — Progress Notes (Signed)
08/06/2023 Name: [name] [name] MRN: [id number] DOB: 27-Aug-1951   [name] [name] is a 72 y.o. year old female who was referred for medication management by their primary care provider, [name], Neta Mends, MD. They presented for a face to face visit today.   They were referred to the pharmacist by their PCP for assistance in managing complex medication management    Subjective:  Care Team: Primary Care Provider: Madelin Headings, MD  Medication Access/Adherence  Current Pharmacy:  [facility]) ELECTRONIC - [place], [place] - [address] [place] [address] Phone: [phone] Fax: [phone]  [facility]) - [place], [place] - [address] [facility] [address] [place] [address] [place] [address] Phone: [phone] Fax: [phone]  [facility] - [place], [place] - [address] [place] [address] Phone: [phone] Fax: [phone]  [id number] - [place], [place] - [address] AT [facility] [address] [place] [address] Phone: [phone] Fax: [phone]  [id number] - [place], [place] - [address] [place] [address] Phone: [phone] Fax: [phone]  [facility] [phone] [place] - [address] [place] [address] Phone: [phone] Fax: [phone]   Patient reports affordability concerns with their medications: No  Patient reports access/transportation concerns to their pharmacy: No  Patient reports adherence concerns with their medications:  No     Atrial Fibrillation:  Current medications: Rate Control: Metoprolol tartrate 50mg  1 tab qam, 2 tabs qpm Rhythm Control: Dofetilide 1 BID, Diltiazem 300mg  1 every day, Diltiazem 30mg  q4h prn breakthrough afib Anticoagulation Regimen: Eliquis 5mg   BID  Medications tried in the past: Flecainide, Propranolol  CHADS2VASC: 3  Current blood pressure/heart rate readings: Brings in an extensive log where she has made note of her readings and afib flares. Has afib several times a week. [place] with an app on her phone called Kadia  Patient denies hypotensive s/sx including dizziness, lightheadedness. Patient denies hypertensive symptoms including headache, chest pain, shortness of breath   Current medication access support: Tikosyn on [facility]Rx   Osteoporosis:  Current medications: Alendronate 70mg  once weekly Medications tried in the past: None  Current supplements: Vitamin D 2000 units daily, Calcium - from her diet    Most recent DEXA: 10/10/22, bone health did improve but still at -2.5 for LFN  Current medication access support: None   Objective:   Lab Results  Component Value Date   CREATININE 0.68 04/26/2023   BUN 21 04/26/2023   NA 140 04/26/2023   K 3.8 04/26/2023   CL 102 04/26/2023   CO2 29 04/26/2023    Lab Results  Component Value Date   CHOL 136 06/27/2022   HDL 72 06/27/2022   LDLCALC 51 06/27/2022   LDLDIRECT 131.0 09/03/2009   TRIG 64 06/27/2022   CHOLHDL 1.9 06/27/2022    Medications Reviewed Today     Reviewed by Sherrill Raring, [name] (Pharmacist) on 08/06/23 at 1128  Med List Status: <None>   Medication Order Taking? Sig Documenting Provider Last Dose Status Informant  acetaminophen (TYLENOL) 500 MG tablet [id number] No Take 1,000 mg by mouth every 6 (six) hours as needed for moderate pain. [provider] Taking Active Self  alendronate (FOSAMAX) 70 MG tablet [id number] No TAKE 1 TABLET BY MOUTH WEEKLY  WITH A FULL GLASS  OF WATER ON AN EMPTY STOMACH Carlus Pavlov, MD Taking Active   apixaban (ELIQUIS) 5 MG TABS tablet [id number] No TAKE 1 TABLET BY MOUTH TWICE  DAILY Rollene Rotunda, MD Taking Active   Cholecalciferol (VITAMIN D3) 1000 units CAPS [id number] No Take 1,000 Units by  mouth daily. [provider] Taking Active   Coenzyme Q10 (COQ10) 100 MG CAPS [id number] No 1 capsule daily. [provider] Taking Active   COVID-19 mRNA vaccine [phone] (COMIRNATY) syringe [id number] No Inject into the muscle.  Taking Active   diltiazem (CARDIZEM) 30 MG tablet [id number] No TAKE 1 TABLET EVERY 4 HOURS AS NEEDED FOR BREAKTHROUGH AFIB [name], [name], PA Taking Active   diltiazem (TIAZAC) 300 MG 24 hr capsule [id number] No TAKE 1 CAPSULE BY MOUTH DAILY Hochrein, James, MD Taking Active   dofetilide (TIKOSYN) 250 MCG capsule [id number] No Take 1 capsule (250 mcg total) by mouth 2 (two) times daily. Regan Lemming, MD Taking Active   influenza vaccine adjuvanted (FLUAD) 0.5 ML injection [id number] No Inject into the muscle. Judyann Munson, MD Taking Active   LORazepam (ATIVAN) 0.5 MG tablet [id number] No TAKE 1 TABLET (0.5 MG TOTAL) BY MOUTH DAILY AS NEEDED FOR ANXIETY (WHEN FLYING). Worthy Rancher B, FNP Taking Active   metoprolol tartrate (LOPRESSOR) 25 MG tablet [id number] No TAKE 1 TABLET BY MOUTH IN THE  MORNING AND 2 TABLETS IN THE  Charyl Dancer, PA-C Taking Active   Multiple Vitamins-Minerals (PRESERVISION AREDS PO) [id number] No Take 1 capsule by mouth in the morning and at bedtime. [provider] Taking Active Self  potassium chloride (KLOR-CON) 10 MEQ tablet [id number] No TAKE 2 TABLETS BY MOUTH DAILY Hochrein, James, MD Taking Active   pravastatin (PRAVACHOL) 20 MG tablet [id number] No TAKE 1 TABLET BY MOUTH IN THE  Shea Stakes, MD Taking Active   RSV vaccine recomb adjuvanted (AREXVY) 120 MCG/0.5ML injection [id number] No Inject into the muscle. Judyann Munson, MD Taking Active               Assessment/Plan:   Atrial Fibrillation: - Currently uncontrolled - Reviewed importance of adherence to anticoagulant for stroke prevention. - Reviewed appropriate blood pressure monitoring technique and reviewed goal blood  pressure. Recommended to check home blood pressure and heart rate daily -Ablation scheduled for 11/4, is going to [facility] cardio on 10/29 for second opinion and looking into potential alternatives  Osteoporosis: - Currently appropriately managed - Reviewed recommendation for daily calcium intake of 1200 mg and vitamin D intake of [phone] units - Recommended to choose calcium citrate formulation due to concurrent acid reflux medication - Reviewed benefits of weight bearing exercise -Been on alendronate since 2018, candidate for drug holiday but high fracture risk, being closely monitored by endo -Continue alendronate 70mg , re-evaluate at next endo appt    Follow Up Plan: 10/08/23  Sherrill Raring, PharmD Clinical Pharmacist [phone]

## 2023-08-10 ENCOUNTER — Ambulatory Visit: Payer: Medicare Other | Admitting: Family Medicine

## 2023-08-10 ENCOUNTER — Encounter: Payer: Self-pay | Admitting: Family Medicine

## 2023-08-10 ENCOUNTER — Telehealth: Payer: Self-pay | Admitting: Internal Medicine

## 2023-08-10 VITALS — BP 124/64 | HR 60 | Temp 98.7°F | Resp 16 | Ht 65.0 in | Wt 97.5 lb

## 2023-08-10 DIAGNOSIS — R300 Dysuria: Secondary | ICD-10-CM | POA: Diagnosis not present

## 2023-08-10 DIAGNOSIS — R3129 Other microscopic hematuria: Secondary | ICD-10-CM

## 2023-08-10 LAB — POC URINALSYSI DIPSTICK (AUTOMATED)
Bilirubin, UA: NEGATIVE
Blood, UA: POSITIVE
Glucose, UA: NEGATIVE
Ketones, UA: NEGATIVE
Leukocytes, UA: NEGATIVE
Nitrite, UA: NEGATIVE
Protein, UA: NEGATIVE
Spec Grav, UA: 1.015 (ref 1.010–1.025)
Urobilinogen, UA: 0.2 U/dL
pH, UA: 6 (ref 5.0–8.0)

## 2023-08-10 LAB — URINALYSIS, MICROSCOPIC ONLY: WBC, UA: NONE SEEN (ref 0–?)

## 2023-08-10 NOTE — Telephone Encounter (Signed)
Pt concerned at lab results showing blood in urine. Pt said provider (saw [place]) would call her if she saw the need. Confirmed with patient that the results were just released today and to allow time for provider to review.

## 2023-08-10 NOTE — Progress Notes (Signed)
ACUTE VISIT Chief Complaint  Patient presents with   Urinary Tract Infection    Having pain after urinating    HPI: [name] is a 72 y.o. female with a PMHx significant for paroxysmal atrial fibrillation, sensorineural hearing loss, HLD, and low back pain, who is here today complaining of a possible UTI.  She complains of brief pain after urinating. She says it started on 10/1 very mildly, but became more noticeable on 10/3. She notes her stream when she urinates is less than usual.   Dysuria  This is a new problem. The current episode started in the past 7 days. The problem has been gradually improving. The quality of the pain is described as aching. The pain is mild. There has been no fever. She is Not sexually active. There is No history of pyelonephritis. Pertinent negatives include no chills, discharge, flank pain, frequency, hematuria, hesitancy, nausea, sweats, urgency or vomiting. She has tried nothing for the symptoms.   She mentions she had nausea and vomiting on 10/1 which she attributes to food poisoning. She had no abdominal pain or diarrhea at that time, and denies nausea or vomiting since then.  She denies any history of nephrolithiasis. No vaginal bleeding or vaginal discharge.  She mentions she had been taking an antibiotic recently after toe mole removal, Doxycycline, in 05/2023.  Review of Systems  Constitutional:  Negative for chills.  HENT:  Negative for mouth sores and sore throat.   Respiratory:  Negative for cough and shortness of breath.   Cardiovascular:  Negative for chest pain and leg swelling.  Gastrointestinal:  Negative for nausea and vomiting.  Genitourinary:  Positive for dysuria. Negative for decreased urine volume, flank pain, frequency, hematuria, hesitancy and urgency.  Neurological:  Negative for syncope and headaches.  Psychiatric/Behavioral:  Negative for confusion and hallucinations.   See other pertinent positives and negatives in  HPI.  Current Outpatient Medications on File Prior to Visit  Medication Sig Dispense Refill   acetaminophen (TYLENOL) 500 MG tablet Take 1,000 mg by mouth every 6 (six) hours as needed for moderate pain.     alendronate (FOSAMAX) 70 MG tablet TAKE 1 TABLET BY MOUTH WEEKLY  WITH A FULL GLASS OF WATER ON AN EMPTY STOMACH 12 tablet 3   apixaban (ELIQUIS) 5 MG TABS tablet TAKE 1 TABLET BY MOUTH TWICE  DAILY 200 tablet 1   Cholecalciferol (VITAMIN D3) 1000 units CAPS Take 1,000 Units by mouth daily.     Coenzyme Q10 (COQ10) 100 MG CAPS 1 capsule daily.     COVID-19 mRNA vaccine 2023-2024 (COMIRNATY) syringe Inject into the muscle. 0.3 mL 0   diltiazem (CARDIZEM) 30 MG tablet TAKE 1 TABLET EVERY 4 HOURS AS NEEDED FOR BREAKTHROUGH AFIB 45 tablet 1   diltiazem (TIAZAC) 300 MG 24 hr capsule TAKE 1 CAPSULE BY MOUTH DAILY 90 capsule 3   dofetilide (TIKOSYN) 250 MCG capsule Take 1 capsule (250 mcg total) by mouth 2 (two) times daily. 180 capsule 3   influenza vaccine adjuvanted (FLUAD) 0.5 ML injection Inject into the muscle. 0.5 mL 0   LORazepam (ATIVAN) 0.5 MG tablet TAKE 1 TABLET (0.5 MG TOTAL) BY MOUTH DAILY AS NEEDED FOR ANXIETY (WHEN FLYING). 20 tablet 0   metoprolol tartrate (LOPRESSOR) 25 MG tablet TAKE 1 TABLET BY MOUTH IN THE  MORNING AND 2 TABLETS IN THE  EVENING 240 tablet 3   Multiple Vitamins-Minerals (PRESERVISION AREDS PO) Take 1 capsule by mouth in the morning and at bedtime.  potassium chloride (KLOR-CON) 10 MEQ tablet TAKE 2 TABLETS BY MOUTH DAILY 200 tablet 2   pravastatin (PRAVACHOL) 20 MG tablet TAKE 1 TABLET BY MOUTH IN THE  EVENING 90 tablet 3   RSV vaccine recomb adjuvanted (AREXVY) 120 MCG/0.5ML injection Inject into the muscle. 0.5 mL 0   No current facility-administered medications on file prior to visit.    Past Medical History:  Diagnosis Date   Allergic rhinitis    Aortic insufficiency    CAT SCRATCH 09/10/2009   Qualifier: Diagnosis of  By: Fabian Sharp MD, Neta Mends     Clavicle fracture 12/2006   left   Diverticulosis    HTN (hypertension)    Osteopenia    Paroxysmal atrial fibrillation (HCC)    Pneumonia    PONV (postoperative nausea and vomiting)    Vision disturbance 03/13/2013   Negative neuro MRA MRI   Allergies  Allergen Reactions   Latex Hives   Other Rash and Other (See Comments)    Adhesive pads left on skin for extended periods = BURNS AND RASHES ON THE SKIN!!!!   Codeine Phosphate Other (See Comments)    Drowsiness    Demerol [Meperidine] Nausea And Vomiting   Protonix [Pantoprazole] Rash   Sulfa Antibiotics Rash   Tens Therapy Replace Back Pads Itching and Rash    Social History   Socioeconomic History   Marital status: Married    Spouse name: Not on file   Number of children: 0   Years of education: Not on file   Highest education level: Bachelor's degree (e.g., BA, AB, BS)  Occupational History   Occupation: Production manager   Occupation: REPORTER    Employer: [employer]  Tobacco Use   Smoking status: Never   Smokeless tobacco: Never   Tobacco comments:    Never smoked 06/28/23  Substance and Sexual Activity   Alcohol use: Not Currently   Drug use: No   Sexual activity: Not on file  Other Topics Concern   Not on file  Social History Narrative   Married   hh of 2    p0g0   Works for [employer]    Social Determinants of Corporate investment banker Strain: Low Risk  (08/09/2023)   Overall Financial Resource Strain (CARDIA)    Difficulty of Paying Living Expenses: Not hard at all  Food Insecurity: No Food Insecurity (08/09/2023)   Hunger Vital Sign    Worried About Running Out of Food in the Last Year: Never true    Ran Out of Food in the Last Year: Never true  Transportation Needs: No Transportation Needs (08/09/2023)   PRAPARE - Administrator, Civil Service (Medical): No    Lack of Transportation (Non-Medical): No  Physical Activity: Insufficiently Active (08/09/2023)   Exercise  Vital Sign    Days of Exercise per Week: 3 days    Minutes of Exercise per Session: 30 min  Stress: No Stress Concern Present (08/09/2023)   [employer] of Occupational Health - Occupational Stress Questionnaire    Feeling of Stress : Only a little  Social Connections: Socially Integrated (08/09/2023)   Social Connection and Isolation Panel [NHANES]    Frequency of Communication with Friends and Family: More than three times a week    Frequency of Social Gatherings with Friends and Family: Three times a week    Attends Religious Services: 1 to 4 times per year    Active Member of Clubs or Organizations: Yes  Attends Banker Meetings: More than 4 times per year    Marital Status: Married    Vitals:   08/10/23 0949  BP: 124/64  Pulse: 60  Resp: 16  Temp: 98.7 F (37.1 C)  SpO2: 97%   Body mass index is 16.22 kg/m.  Physical Exam Vitals and nursing note reviewed.  Constitutional:      General: She is not in acute distress.    Appearance: She is well-developed.  HENT:     Head: Normocephalic and atraumatic.     Mouth/Throat:     Mouth: Mucous membranes are moist.  Eyes:     Conjunctiva/sclera: Conjunctivae normal.  Cardiovascular:     Rate and Rhythm: Normal rate and regular rhythm.  Pulmonary:     Effort: Pulmonary effort is normal. No respiratory distress.     Breath sounds: Normal breath sounds.  Abdominal:     Palpations: Abdomen is soft. There is no mass.     Tenderness: There is no abdominal tenderness. There is no CVA tenderness, right CVA tenderness or left CVA tenderness.  Musculoskeletal:        General: No edema.  Skin:    General: Skin is warm.     Findings: No erythema.  Neurological:     General: No focal deficit present.     Mental Status: She is alert and oriented to person, place, and time.     Gait: Gait is intact.  Psychiatric:        Mood and Affect: Mood and affect, mood and affect normal.   ASSESSMENT AND PLAN:  Ms.  Winburn was seen today for a possible UTI.   Dysuria Described as achy and after urination, mild,and improved today. No other urine symptom associated. We discussed possible etiologies. Urine dipstick today not very suggestive of UTI. Options discussed, she prefers to hold on empiric abx treatment. Instructed to monitor for new symptoms and increase water intake. Further recommendations according to Ucx.  -     POCT Urinalysis Dipstick (Automated) -     Urine Culture  Microscopic hematuria Urine dipstick positive for blood. No hx of tobacco use. Possible causes discussed, she had it in the past, 1+ about 6 years ago. Further recommendations will be given according to urine microscopic exam.  -     Urine Microscopic  Return if symptoms worsen or fail to improve.  I, Rolla Etienne Wierda, acting as a scribe for Toryn Mcclinton [place], MD., have documented all relevant documentation on the behalf of Kambrie Eddleman [place], MD, as directed by  Debanhi Blaker [place], MD while in the presence of Noreta Kue [place], MD.   I, Jamileth Putzier [place], MD, have reviewed all documentation for this visit. The documentation on 08/11/23 for the exam, diagnosis, procedures, and orders are all accurate and complete.  Elva Mauro G. [place], MD  [facility]. [place] office.

## 2023-08-10 NOTE — Patient Instructions (Addendum)
A few things to remember from today's visit:  Dysuria - Plan: POCT Urinalysis Dipstick (Automated), Culture, Urine  Microscopic hematuria - Plan: Urine Microscopic Only  Monitor for new symptoms. Will wait for urine culture. Increase fluid intake.  Do not use My Chart to request refills or for acute issues that need immediate attention. If you send a my chart message, it may take a few days to be addressed, specially if I am not in the office.  Please be sure medication list is accurate. If a new problem present, please set up appointment sooner than planned today.

## 2023-08-11 ENCOUNTER — Encounter: Payer: Self-pay | Admitting: Family Medicine

## 2023-08-11 LAB — URINE CULTURE
MICRO NUMBER:: 15584691
SPECIMEN QUALITY:: ADEQUATE

## 2023-08-13 NOTE — Telephone Encounter (Signed)
See my chart encounter.

## 2023-08-16 DIAGNOSIS — Z1231 Encounter for screening mammogram for malignant neoplasm of breast: Secondary | ICD-10-CM | POA: Diagnosis not present

## 2023-08-16 LAB — HM MAMMOGRAPHY

## 2023-08-17 ENCOUNTER — Encounter: Payer: Self-pay | Admitting: Internal Medicine

## 2023-08-19 NOTE — Progress Notes (Unsigned)
Electrophysiology Office Note:   Date:  08/21/2023  ID:  [name] [name], DOB June 23, 1951, MRN [id number]  Primary Cardiologist: Rollene Rotunda, MD Electrophysiologist: Regan Lemming, MD      History of Present Illness:   [name] [name] is a 72 y.o. female with h/o hypertension and atrial fibrillation post ablation 02/01/2021 seen today for routine electrophysiology followup.   She is on dofetilide for her atrial fibrillation.  She had multiple atrial flutter circuits during her ablation.  She has had more frequent episodes of atrial fibrillation.  No obvious triggers for her atrial fibrillation.  Confirmed by [name] [name] mobile device.  Since last being seen in our clinic the patient reports more frequent episodes of atrial fibrillation.  This has been confirmed on her mobile device as above.  She feels tired and fatigued when she is in atrial fibrillation.  She would prefer a rhythm control strategy and prefer to avoid medication adjustments.  she denies chest pain, palpitations, dyspnea, PND, orthopnea, nausea, vomiting, dizziness, syncope, edema, weight gain, or early satiety.   Review of systems complete and found to be negative unless listed in HPI.   EP Information / Studies Reviewed:    EKG is not ordered today. EKG from 06/28/23 reviewed which showed sinus rhythm        Risk Assessment/Calculations:    CHA2DS2-VASc Score = 3   This indicates a 3.2% annual risk of stroke. The patient's score is based upon: CHF History: 0 HTN History: 1 Diabetes History: 0 Stroke History: 0 Vascular Disease History: 0 Age Score: 1 Gender Score: 1            Physical Exam:   VS:  BP (!) 150/74 (BP Location: Left Arm, Patient Position: Sitting, Cuff Size: Small)   Pulse (!) 58   Ht 5\' 5"  (1.651 m)   Wt 97 lb 12.8 oz (44.4 kg)   SpO2 95%   BMI 16.27 kg/m    Wt Readings from Last 3 Encounters:  08/21/23 97 lb 12.8 oz (44.4 kg)  08/10/23 97 lb 8 oz (44.2 kg)  06/28/23 97 lb 12.8 oz (44.4  kg)     GEN: Well nourished, well developed in no acute distress NECK: No JVD; No carotid bruits CARDIAC: Regular rate and rhythm, no murmurs, rubs, gallops RESPIRATORY:  Clear to auscultation without rales, wheezing or rhonchi  ABDOMEN: Soft, non-tender, non-distended EXTREMITIES:  No edema; No deformity   ASSESSMENT AND PLAN:    1.  Paroxysmal atrial fibrillation: Post ablation 02/01/2021.  Multiple atrial flutter circuits noted on EP study.  Currently on dofetilide.  She is continue to have episodes of atrial fibrillation.  [name] [name] plan for repeat ablation.  Risk, benefits, and alternatives to EP study and radiofrequency/pulse field ablation for afib were also discussed in detail today. These risks include but are not limited to stroke, bleeding, vascular damage, tamponade, perforation, damage to the esophagus, lungs, and other structures, pulmonary vein stenosis, worsening renal function, and death. The patient understands these risk and wishes to proceed.  We [name] [name] therefore proceed with catheter ablation at the next available time.  Carto, ICE, anesthesia are requested for the procedure.  [name] [name] also obtain CT PV protocol prior to the procedure to exclude LAA thrombus and further evaluate atrial anatomy.  2.  Second hypercoagulable state: Currently on Eliquis for atrial fibrillation  3.  Aortic regurgitation: Plan per primary cardiology  4.  Hypertension: Elevated today.  She brings in blood pressure recordings from home which are completely  well-controlled.  No changes.  Follow up with Dr. Elberta Fortis as usual post procedure  Signed, [name] Scherzinger [name], MD

## 2023-08-19 NOTE — H&P (View-Only) (Signed)
Electrophysiology Office Note:   Date:  08/21/2023  ID:  [name] [name], DOB June 23, 1951, MRN [id number]  Primary Cardiologist: Rollene Rotunda, MD Electrophysiologist: Regan Lemming, MD      History of Present Illness:   [name] [name] is a 72 y.o. female with h/o hypertension and atrial fibrillation post ablation 02/01/2021 seen today for routine electrophysiology followup.   She is on dofetilide for her atrial fibrillation.  She had multiple atrial flutter circuits during her ablation.  She has had more frequent episodes of atrial fibrillation.  No obvious triggers for her atrial fibrillation.  Confirmed by [name] mobile device.  Since last being seen in our clinic the patient reports more frequent episodes of atrial fibrillation.  This has been confirmed on her mobile device as above.  She feels tired and fatigued when she is in atrial fibrillation.  She would prefer a rhythm control strategy and prefer to avoid medication adjustments.  she denies chest pain, palpitations, dyspnea, PND, orthopnea, nausea, vomiting, dizziness, syncope, edema, weight gain, or early satiety.   Review of systems complete and found to be negative unless listed in HPI.   EP Information / Studies Reviewed:    EKG is not ordered today. EKG from 06/28/23 reviewed which showed sinus rhythm        Risk Assessment/Calculations:    CHA2DS2-VASc Score = 3   This indicates a 3.2% annual risk of stroke. The patient's score is based upon: CHF History: 0 HTN History: 1 Diabetes History: 0 Stroke History: 0 Vascular Disease History: 0 Age Score: 1 Gender Score: 1            Physical Exam:   VS:  BP (!) 150/74 (BP Location: Left Arm, Patient Position: Sitting, Cuff Size: Small)   Pulse (!) 58   Ht 5\' 5"  (1.651 m)   Wt 97 lb 12.8 oz (44.4 kg)   SpO2 95%   BMI 16.27 kg/m    Wt Readings from Last 3 Encounters:  08/21/23 97 lb 12.8 oz (44.4 kg)  08/10/23 97 lb 8 oz (44.2 kg)  06/28/23 97 lb 12.8 oz (44.4  kg)     GEN: Well nourished, well developed in no acute distress NECK: No JVD; No carotid bruits CARDIAC: Regular rate and rhythm, no murmurs, rubs, gallops RESPIRATORY:  Clear to auscultation without rales, wheezing or rhonchi  ABDOMEN: Soft, non-tender, non-distended EXTREMITIES:  No edema; No deformity   ASSESSMENT AND PLAN:    1.  Paroxysmal atrial fibrillation: Post ablation 02/01/2021.  Multiple atrial flutter circuits noted on EP study.  Currently on dofetilide.  She is continue to have episodes of atrial fibrillation.  [name] plan for repeat ablation.  Risk, benefits, and alternatives to EP study and radiofrequency/pulse field ablation for afib were also discussed in detail today. These risks include but are not limited to stroke, bleeding, vascular damage, tamponade, perforation, damage to the esophagus, lungs, and other structures, pulmonary vein stenosis, worsening renal function, and death. The patient understands these risk and wishes to proceed.  We [name] therefore proceed with catheter ablation at the next available time.  Carto, ICE, anesthesia are requested for the procedure.  [name] also obtain CT PV protocol prior to the procedure to exclude LAA thrombus and further evaluate atrial anatomy.  2.  Second hypercoagulable state: Currently on Eliquis for atrial fibrillation  3.  Aortic regurgitation: Plan per primary cardiology  4.  Hypertension: Elevated today.  She brings in blood pressure recordings from home which are completely  well-controlled.  No changes.  Follow up with Dr. Elberta Fortis as usual post procedure  Signed, Modestine Scherzinger [name], MD

## 2023-08-21 ENCOUNTER — Encounter: Payer: Self-pay | Admitting: Cardiology

## 2023-08-21 ENCOUNTER — Telehealth: Payer: Self-pay | Admitting: *Deleted

## 2023-08-21 ENCOUNTER — Ambulatory Visit: Payer: Medicare Other | Attending: Cardiology | Admitting: Cardiology

## 2023-08-21 VITALS — BP 150/74 | HR 58 | Ht 65.0 in | Wt 97.8 lb

## 2023-08-21 DIAGNOSIS — I10 Essential (primary) hypertension: Secondary | ICD-10-CM

## 2023-08-21 DIAGNOSIS — D6869 Other thrombophilia: Secondary | ICD-10-CM | POA: Diagnosis not present

## 2023-08-21 DIAGNOSIS — I480 Paroxysmal atrial fibrillation: Secondary | ICD-10-CM | POA: Diagnosis not present

## 2023-08-21 DIAGNOSIS — Z01812 Encounter for preprocedural laboratory examination: Secondary | ICD-10-CM

## 2023-08-21 NOTE — Telephone Encounter (Signed)
Spoke to patient..Advised patient to disregard instructions for cardiac pet  scan. After reviewing chart she is scheduled to have a  CT Cardiac Morph. Patient would like a call from Dr. Elberta Fortis RN.  Message sent to Dr. Elberta Fortis RN to call patient and go over instructions for procedure.

## 2023-08-21 NOTE — Telephone Encounter (Signed)
Spoke to patient she had several questions about Cardiac CT that is scheduled 10/25. Cardiac CT instructions reviewed with patient and instructions  sent through my chart.Message sent to Dr. Antoine Poche to make him aware,

## 2023-08-22 ENCOUNTER — Telehealth: Payer: Self-pay | Admitting: Cardiology

## 2023-08-22 LAB — CBC
Hematocrit: 43.3 % (ref 34.0–46.6)
Hemoglobin: 14 g/dL (ref 11.1–15.9)
MCH: 32 pg (ref 26.6–33.0)
MCHC: 32.3 g/dL (ref 31.5–35.7)
MCV: 99 fL — ABNORMAL HIGH (ref 79–97)
Platelets: 242 10*3/uL (ref 150–450)
RBC: 4.37 x10E6/uL (ref 3.77–5.28)
RDW: 11.4 % — ABNORMAL LOW (ref 11.7–15.4)
WBC: 5.3 10*3/uL (ref 3.4–10.8)

## 2023-08-22 LAB — BASIC METABOLIC PANEL
BUN/Creatinine Ratio: 34 — ABNORMAL HIGH (ref 12–28)
BUN: 25 mg/dL (ref 8–27)
CO2: 27 mmol/L (ref 20–29)
Calcium: 9.8 mg/dL (ref 8.7–10.3)
Chloride: 104 mmol/L (ref 96–106)
Creatinine, Ser: 0.73 mg/dL (ref 0.57–1.00)
Glucose: 81 mg/dL (ref 70–99)
Potassium: 4.7 mmol/L (ref 3.5–5.2)
Sodium: 141 mmol/L (ref 134–144)
eGFR: 88 mL/min/{1.73_m2} (ref >59)

## 2023-08-22 NOTE — Telephone Encounter (Signed)
Open in error

## 2023-08-22 NOTE — Telephone Encounter (Signed)
Pt questions answered. Clarified to only go by the cardiac CT instructions that we gave her yesterday and disregard to cardiac PET instructions that were sent to her yesterday by another nurse. She also asked about the blood work she completed yesterday - advised that MD has not reviewed yet, but that no news is good news.  Explained we do not call normal blood work. Patient verbalized understanding and agreeable to plan.

## 2023-08-22 NOTE — Telephone Encounter (Signed)
Pt is requesting a callback regarding some of the info she received yesterday on her discharge summary from the appt. Please advise

## 2023-08-23 ENCOUNTER — Telehealth (HOSPITAL_COMMUNITY): Payer: Self-pay | Admitting: Emergency Medicine

## 2023-08-23 NOTE — Telephone Encounter (Signed)
Attempted to call patient regarding upcoming cardiac CT appointment. °Left message on voicemail with name and callback number °Dwan Fennel RN Navigator Cardiac Imaging °[facility] °[phone] Office °[phone] Cell ° °

## 2023-08-23 NOTE — Telephone Encounter (Signed)
Reaching out to patient to offer assistance regarding upcoming cardiac imaging study; pt verbalizes understanding of appt date/time, parking situation and where to check in, pre-test NPO status and medications ordered, and verified current allergies; name and call back number provided for further questions should they arise Cayne Yom RN Navigator Cardiac Imaging [facility] [phone] office [phone] cell 

## 2023-08-24 ENCOUNTER — Ambulatory Visit (HOSPITAL_COMMUNITY)
Admission: RE | Admit: 2023-08-24 | Discharge: 2023-08-24 | Disposition: A | Discharge status: Home / Self Care | Payer: Medicare Other | Source: Ambulatory Visit | Attending: Internal Medicine | Admitting: Internal Medicine

## 2023-08-24 DIAGNOSIS — I480 Paroxysmal atrial fibrillation: Secondary | ICD-10-CM

## 2023-08-24 MED ORDER — IOHEXOL 350 MG/ML SOLN
100.0000 mL | Freq: Once | INTRAVENOUS | Status: AC | PRN
  Administered 2023-08-24: 100 mL via INTRAVENOUS

## 2023-08-28 DIAGNOSIS — I480 Paroxysmal atrial fibrillation: Secondary | ICD-10-CM | POA: Diagnosis not present

## 2023-08-30 ENCOUNTER — Encounter: Payer: Self-pay | Admitting: Cardiology

## 2023-08-30 ENCOUNTER — Other Ambulatory Visit: Payer: Self-pay | Admitting: *Deleted

## 2023-08-30 DIAGNOSIS — I7789 Other specified disorders of arteries and arterioles: Secondary | ICD-10-CM

## 2023-08-31 NOTE — Pre-Procedure Instructions (Signed)
Attempted to call patient regarding procedure instructions.  Left voicemail on the following items: Arrival time 0515 Nothing to eat or drink after midnight No meds AM of procedure Responsible person to drive you home and stay with you for 24 hrs  Have you missed any doses of anti-coagulant Eliquis- should be taken twice a day, if you have missed any doses please let us know.  Don't take dose on Monday morning.

## 2023-09-03 ENCOUNTER — Other Ambulatory Visit: Payer: Self-pay

## 2023-09-03 ENCOUNTER — Ambulatory Visit (HOSPITAL_COMMUNITY)
Admission: RE | Disposition: A | Discharge status: Home / Self Care | Payer: Self-pay | Source: Home / Self Care | Attending: Cardiology

## 2023-09-03 ENCOUNTER — Ambulatory Visit (HOSPITAL_COMMUNITY)
Admission: RE | Admit: 2023-09-03 | Discharge: 2023-09-03 | Disposition: A | Discharge status: Home / Self Care | Payer: Medicare Other | Attending: Cardiology | Admitting: Cardiology

## 2023-09-03 ENCOUNTER — Ambulatory Visit (HOSPITAL_COMMUNITY): Payer: Medicare Other

## 2023-09-03 ENCOUNTER — Ambulatory Visit (HOSPITAL_BASED_OUTPATIENT_CLINIC_OR_DEPARTMENT_OTHER): Payer: Medicare Other

## 2023-09-03 DIAGNOSIS — I351 Nonrheumatic aortic (valve) insufficiency: Secondary | ICD-10-CM | POA: Diagnosis not present

## 2023-09-03 DIAGNOSIS — Z7901 Long term (current) use of anticoagulants: Secondary | ICD-10-CM | POA: Diagnosis not present

## 2023-09-03 DIAGNOSIS — D6859 Other primary thrombophilia: Secondary | ICD-10-CM | POA: Diagnosis not present

## 2023-09-03 DIAGNOSIS — I4891 Unspecified atrial fibrillation: Secondary | ICD-10-CM

## 2023-09-03 DIAGNOSIS — I10 Essential (primary) hypertension: Secondary | ICD-10-CM | POA: Diagnosis not present

## 2023-09-03 DIAGNOSIS — I4819 Other persistent atrial fibrillation: Secondary | ICD-10-CM | POA: Diagnosis not present

## 2023-09-03 HISTORY — PX: ATRIAL FIBRILLATION ABLATION: EP1191

## 2023-09-03 LAB — POCT ACTIVATED CLOTTING TIME: Activated Clotting Time: 435 s

## 2023-09-03 SURGERY — ATRIAL FIBRILLATION ABLATION
Anesthesia: General

## 2023-09-03 MED ORDER — PROPOFOL 10 MG/ML IV BOLUS
INTRAVENOUS | Status: DC | PRN
  Administered 2023-09-03: 20 mg via INTRAVENOUS
  Administered 2023-09-03: 100 mg via INTRAVENOUS

## 2023-09-03 MED ORDER — SODIUM CHLORIDE 0.9% FLUSH
3.0000 mL | INTRAVENOUS | Status: DC | PRN
Start: 2023-09-03 — End: 2023-09-03

## 2023-09-03 MED ORDER — ATROPINE SULFATE 1 MG/ML IV SOLN
INTRAVENOUS | Status: DC | PRN
  Administered 2023-09-03: 1 mg via INTRAVENOUS

## 2023-09-03 MED ORDER — DEXAMETHASONE SODIUM PHOSPHATE 10 MG/ML IJ SOLN
INTRAMUSCULAR | Status: DC | PRN
  Administered 2023-09-03: 10 mg via INTRAVENOUS

## 2023-09-03 MED ORDER — PROTAMINE SULFATE 10 MG/ML IV SOLN
INTRAVENOUS | Status: DC | PRN
Start: 2023-09-03 — End: 2023-09-03
  Administered 2023-09-03: 40 mg via INTRAVENOUS

## 2023-09-03 MED ORDER — HEPARIN (PORCINE) IN NACL 1000-0.9 UT/500ML-% IV SOLN
INTRAVENOUS | Status: DC | PRN
  Administered 2023-09-03 (×3): 500 mL

## 2023-09-03 MED ORDER — ATROPINE SULFATE 1 MG/10ML IJ SOSY
PREFILLED_SYRINGE | INTRAMUSCULAR | Status: AC
  Filled 2023-09-03: qty 10

## 2023-09-03 MED ORDER — DEXMEDETOMIDINE HCL IN NACL 80 MCG/20ML IV SOLN
INTRAVENOUS | Status: DC | PRN
  Administered 2023-09-03: 8 ug via INTRAVENOUS

## 2023-09-03 MED ORDER — SODIUM CHLORIDE 0.9 % IV SOLN: INTRAVENOUS | Status: DC

## 2023-09-03 MED ORDER — PROPOFOL 500 MG/50ML IV EMUL
INTRAVENOUS | Status: DC | PRN
Start: 2023-09-03 — End: 2023-09-03
  Administered 2023-09-03: 55 ug/kg/min via INTRAVENOUS

## 2023-09-03 MED ORDER — SODIUM CHLORIDE 0.9% FLUSH: 3.0000 mL | Freq: Two times a day (BID) | INTRAVENOUS | Status: DC

## 2023-09-03 MED ORDER — ONDANSETRON HCL 4 MG/2ML IJ SOLN
INTRAMUSCULAR | Status: DC | PRN
  Administered 2023-09-03: 4 mg via INTRAVENOUS

## 2023-09-03 MED ORDER — SUGAMMADEX SODIUM 200 MG/2ML IV SOLN
INTRAVENOUS | Status: DC | PRN
  Administered 2023-09-03: 200 mg via INTRAVENOUS

## 2023-09-03 MED ORDER — FENTANYL CITRATE (PF) 250 MCG/5ML IJ SOLN
INTRAMUSCULAR | Status: DC | PRN
  Administered 2023-09-03 (×2): 50 ug via INTRAVENOUS

## 2023-09-03 MED ORDER — HEPARIN SODIUM (PORCINE) 1000 UNIT/ML IJ SOLN
INTRAMUSCULAR | Status: DC | PRN
Start: 2023-09-03 — End: 2023-09-03
  Administered 2023-09-03: 13000 [IU] via INTRAVENOUS

## 2023-09-03 MED ORDER — ONDANSETRON HCL 4 MG/2ML IJ SOLN: 4.0000 mg | Freq: Four times a day (QID) | INTRAMUSCULAR | Status: DC | PRN

## 2023-09-03 MED ORDER — SODIUM CHLORIDE 0.9 % IV SOLN: 250.0000 mL | INTRAVENOUS | Status: DC | PRN

## 2023-09-03 MED ORDER — ROCURONIUM BROMIDE 10 MG/ML (PF) SYRINGE
PREFILLED_SYRINGE | INTRAVENOUS | Status: DC | PRN
  Administered 2023-09-03: 50 mg via INTRAVENOUS
  Administered 2023-09-03 (×2): 10 mg via INTRAVENOUS

## 2023-09-03 MED ORDER — LIDOCAINE 2% (20 MG/ML) 5 ML SYRINGE
INTRAMUSCULAR | Status: DC | PRN
  Administered 2023-09-03: 50 mg via INTRAVENOUS
  Administered 2023-09-03: 60 mg via INTRAVENOUS

## 2023-09-03 MED ORDER — ACETAMINOPHEN 325 MG PO TABS: 650.0000 mg | ORAL_TABLET | ORAL | Status: DC | PRN

## 2023-09-03 SURGICAL SUPPLY — 22 items
BAG SNAP BAND KOVER 36X36 (MISCELLANEOUS) IMPLANT
CABLE PFA RX CATH CONN (CABLE) IMPLANT
CATH FARAWAVE ABLATION 31 (CATHETERS) IMPLANT
CATH OCTARAY 2.0 F 3-3-3-3-3 (CATHETERS) IMPLANT
CATH PIGTAIL STEERABLE D1 8.7 (WIRE) IMPLANT
CATH SOUNDSTAR ECO 8FR (CATHETERS) IMPLANT
CATH WEB BI DIR CSDF CRV REPRO (CATHETERS) IMPLANT
CLOSURE MYNX CONTROL 6F/7F (Vascular Products) IMPLANT
CLOSURE PERCLOSE PROSTYLE (VASCULAR PRODUCTS) IMPLANT
COVER SWIFTLINK CONNECTOR (BAG) ×1 IMPLANT
DILATOR VESSEL 38 20CM 16FR (INTRODUCER) IMPLANT
GUIDEWIRE INQWIRE 1.5J.035X260 (WIRE) IMPLANT
INQWIRE 1.5J .035X260CM (WIRE) ×1
PACK EP LATEX FREE (CUSTOM PROCEDURE TRAY) ×1
PACK EP LF (CUSTOM PROCEDURE TRAY) ×1 IMPLANT
PAD DEFIB RADIO PHYSIO CONN (PAD) ×1 IMPLANT
PATCH CARTO3 (PAD) IMPLANT
SHEATH FARADRIVE STEERABLE (SHEATH) IMPLANT
SHEATH PINNACLE 8F 10CM (SHEATH) IMPLANT
SHEATH PINNACLE 9F 10CM (SHEATH) IMPLANT
SHEATH PROBE COVER 6X72 (BAG) IMPLANT
SHEATH WIRE KIT BAYLIS SL1 (KITS) IMPLANT

## 2023-09-03 NOTE — Interval H&P Note (Signed)
History and Physical Interval Note:  09/03/2023 7:02 AM  [name] [name]  has presented today for surgery, with the diagnosis of afib.  The various methods of treatment have been discussed with the patient and family. After consideration of risks, benefits and other options for treatment, the patient has consented to  Procedure(s): ATRIAL FIBRILLATION ABLATION (N/A) as a surgical intervention.  The patient's history has been reviewed, patient examined, no change in status, stable for surgery.  I have reviewed the patient's chart and labs.  Questions were answered to the patient's satisfaction.     Leshae Mcclay [employer]

## 2023-09-03 NOTE — Discharge Instructions (Signed)

## 2023-09-03 NOTE — Progress Notes (Signed)
Pt ambulated to and from bathroom to void with no signs of oozing from bilateral groin sites  

## 2023-09-03 NOTE — Anesthesia Procedure Notes (Signed)
Procedure Name: Intubation Date/Time: 09/03/2023 7:47 AM  Performed by: Vena Austria, CRNAPre-anesthesia Checklist: Patient identified, Emergency Drugs available, Suction available, Patient being monitored and Timeout performed Patient Re-evaluated:Patient Re-evaluated prior to induction Oxygen Delivery Method: Circle system utilized Preoxygenation: Pre-oxygenation with 100% oxygen Induction Type: IV induction Ventilation: Mask ventilation without difficulty Laryngoscope Size: McGraph and 3 Grade View: Grade I Tube type: Oral Tube size: 7.0 mm Number of attempts: 1 Airway Equipment and Method: Stylet Placement Confirmation: ETT inserted through vocal cords under direct vision, positive ETCO2, CO2 detector and breath sounds checked- equal and bilateral Secured at: 22 cm Tube secured with: Tape Dental Injury: Teeth and Oropharynx as per pre-operative assessment

## 2023-09-03 NOTE — Anesthesia Preprocedure Evaluation (Addendum)
Anesthesia Evaluation  Patient identified by MRN, date of birth, ID band Patient awake    Reviewed: Allergy & Precautions, H&P , NPO status , Patient's Chart, lab work & pertinent test results, reviewed documented beta blocker date and time   History of Anesthesia Complications (+) PONV and history of anesthetic complications (has done well the last few times w/ anesthesia)  Airway Mallampati: II  TM Distance: >3 FB Neck ROM: Full    Dental  (+) Teeth Intact, Dental Advisory Given   Pulmonary neg pulmonary ROS   Pulmonary exam normal breath sounds clear to auscultation       Cardiovascular hypertension (148/74 preop, per pt normally slightly lower), Pt. on medications and Pt. on home beta blockers + dysrhythmias (eliquis LD yesterday) Atrial Fibrillation  Rhythm:Irregular Rate:Normal     Neuro/Psych  PSYCHIATRIC DISORDERS Anxiety     negative neurological ROS     GI/Hepatic negative GI ROS, Neg liver ROS,,,  Endo/Other  negative endocrine ROS    Renal/GU negative Renal ROS  negative genitourinary   Musculoskeletal negative musculoskeletal ROS (+)    Abdominal   Peds negative pediatric ROS (+)  Hematology negative hematology ROS (+) Hb 14   Anesthesia Other Findings   Reproductive/Obstetrics negative OB ROS                             Anesthesia Physical Anesthesia Plan  ASA: 2  Anesthesia Plan: General   Post-op Pain Management:    Induction: Intravenous  PONV Risk Score and Plan: Ondansetron, Dexamethasone and Treatment may vary due to age or medical condition  Airway Management Planned: Oral ETT  Additional Equipment: None  Intra-op Plan:   Post-operative Plan: Extubation in OR  Informed Consent: I have reviewed the patients History and Physical, chart, labs and discussed the procedure including the risks, benefits and alternatives for the proposed anesthesia with the  patient or authorized representative who has indicated his/her understanding and acceptance.     Dental advisory given  Plan Discussed with: CRNA  Anesthesia Plan Comments: (Last airway note Ventilation: Mask ventilation without difficulty Laryngoscope Size: Mac and 4 Grade View: Grade II Tube type: Oral Tube size: 7.0 mm Number of attempts: 1 )       Anesthesia Quick Evaluation

## 2023-09-03 NOTE — Anesthesia Postprocedure Evaluation (Signed)
Anesthesia Post Note  Patient: [name] [name]  Procedure(s) Performed: ATRIAL FIBRILLATION ABLATION     Patient location during evaluation: PACU Anesthesia Type: General Level of consciousness: awake and alert, oriented and patient cooperative Pain management: pain level controlled Vital Signs Assessment: post-procedure vital signs reviewed and stable Respiratory status: spontaneous breathing, nonlabored ventilation and respiratory function stable Cardiovascular status: blood pressure returned to baseline and stable Postop Assessment: no apparent nausea or vomiting Anesthetic complications: no   There were no known notable events for this encounter.  Last Vitals:  Vitals:   09/03/23 0955 09/03/23 1000  BP: (!) 144/67 (!) 141/66  Pulse: 73 74  Resp: 14 17  Temp: 36.4 C   SpO2: 96% 95%    Last Pain:  Vitals:   09/03/23 1005  TempSrc:   PainSc: 0-No pain                 Lannie Fields

## 2023-09-03 NOTE — Transfer of Care (Signed)
Immediate Anesthesia Transfer of Care Note  Patient: [name] [name]  Procedure(s) Performed: ATRIAL FIBRILLATION ABLATION  Patient Location: PACU and Cath Lab  Anesthesia Type:General  Level of Consciousness: awake  Airway & Oxygen Therapy: Patient connected to nasal cannula oxygen  Post-op Assessment: Report given to RN  Post vital signs: stable  Last Vitals:  Vitals Value Taken Time  BP    Temp    Pulse    Resp    SpO2      Last Pain:  Vitals:   09/03/23 0610  TempSrc: Temporal  PainSc: 0-No pain         Complications: There were no known notable events for this encounter.

## 2023-09-04 ENCOUNTER — Encounter (HOSPITAL_COMMUNITY): Payer: Self-pay | Admitting: Cardiology

## 2023-09-04 ENCOUNTER — Encounter: Payer: Self-pay | Admitting: Cardiology

## 2023-09-04 MED FILL — Atropine Sulfate Soln Prefill Syr 1 MG/10ML (0.1 MG/ML): INTRAMUSCULAR | Qty: 10 | Status: AC

## 2023-09-04 NOTE — Telephone Encounter (Signed)
Addressed by nurse in previous encounter.

## 2023-09-08 DIAGNOSIS — I480 Paroxysmal atrial fibrillation: Secondary | ICD-10-CM | POA: Diagnosis not present

## 2023-09-25 ENCOUNTER — Encounter: Payer: Self-pay | Admitting: Internal Medicine

## 2023-10-01 ENCOUNTER — Ambulatory Visit (HOSPITAL_COMMUNITY)
Admission: RE | Admit: 2023-10-01 | Discharge: 2023-10-01 | Disposition: A | Discharge status: Home / Self Care | Payer: Medicare Other | Source: Ambulatory Visit | Attending: Physician Assistant | Admitting: Physician Assistant

## 2023-10-01 VITALS — BP 130/58 | HR 54 | Ht 65.0 in | Wt 97.6 lb

## 2023-10-01 DIAGNOSIS — D6869 Other thrombophilia: Secondary | ICD-10-CM | POA: Diagnosis not present

## 2023-10-01 DIAGNOSIS — Z7901 Long term (current) use of anticoagulants: Secondary | ICD-10-CM | POA: Diagnosis not present

## 2023-10-01 DIAGNOSIS — I10 Essential (primary) hypertension: Secondary | ICD-10-CM | POA: Insufficient documentation

## 2023-10-01 DIAGNOSIS — I2510 Atherosclerotic heart disease of native coronary artery without angina pectoris: Secondary | ICD-10-CM | POA: Insufficient documentation

## 2023-10-01 DIAGNOSIS — I480 Paroxysmal atrial fibrillation: Secondary | ICD-10-CM | POA: Insufficient documentation

## 2023-10-01 DIAGNOSIS — Z79899 Other long term (current) drug therapy: Secondary | ICD-10-CM | POA: Diagnosis not present

## 2023-10-01 DIAGNOSIS — Z5181 Encounter for therapeutic drug level monitoring: Secondary | ICD-10-CM

## 2023-10-01 LAB — BASIC METABOLIC PANEL
Anion gap: 8 (ref 5–15)
BUN: 29 mg/dL — ABNORMAL HIGH (ref 8–23)
CO2: 25 mmol/L (ref 22–32)
Calcium: 9.9 mg/dL (ref 8.9–10.3)
Chloride: 107 mmol/L (ref 98–111)
Creatinine, Ser: 0.77 mg/dL (ref 0.44–1.00)
GFR, Estimated: >60 mL/min (ref >60)
Glucose, Bld: 90 mg/dL (ref 70–99)
Potassium: 4 mmol/L (ref 3.5–5.1)
Sodium: 140 mmol/L (ref 135–145)

## 2023-10-01 LAB — MAGNESIUM: Magnesium: 2.3 mg/dL (ref 1.7–2.4)

## 2023-10-01 NOTE — Progress Notes (Signed)
Primary Care Physician: Madelin Headings, MD Referring Physician: Dr. Antoine Poche EP: Dr. Renae Fickle is a 72 y.o. female with a h/o HTN, paroxysmal afib, initially diagnosed with afib for several years. She is on rate control/flecainde  meds as well as eliquis 5 mg for a chadsvasc score  of 3. She is in the afib clinic to f/u ablation one month ago. She did have some episodes the second week of ablation but sine 18th of April she has not had anymore episodes. She did have an allergic reaction on her torso, probaly form the pads that were placed on chest for cardioversion. It has resolved. No swallowing or groin issues. She is back to her usual activities.    Follow up in the AF clinic 05/31/21. Patient presents for dofetilide admission. She reports that she has had afib about every other day. She has stopped flecainide 3 days ago. She denies any missed doses of anticoagulation.   Follow up in the AF clinic 05/31/21. Patient is s/p dofetilide admission 8/2-06/03/21. Patient reports she has not had any afib since starting the medication which is an improvement for her. She denies any bleeding issues on anticoagulation.   Follow up in the AF clinic 06/28/23. Patient reports that she has been having more frequent afib episodes, sometimes multiple episodes per day. There does not appear to be a specific trigger for these episodes. She keeps a log with her Kardia mobile device. She has symptoms of palpitations and fatigue.  Follow up in the AF clinic 10/01/23. Patient is s/p repeat afib ablation on 09/03/23 with Dr Elberta Fortis. She reports that she has done well since the ablation with no episodes detected on her Kardia mobile. She denies chest pain or groin issues.   Today, she denies symptoms of chest pain, shortness of breath, orthopnea, PND, lower extremity edema, dizziness, presyncope, syncope, or neurologic sequela. The patient is tolerating medications without difficulties and is otherwise without  complaint today.   Past Medical History:  Diagnosis Date   Allergic rhinitis    Aortic insufficiency    CAT SCRATCH 09/10/2009   Qualifier: Diagnosis of  By: Fabian Sharp MD, Neta Mends    Clavicle fracture 12/2006   left   Diverticulosis    HTN (hypertension)    Osteopenia    Paroxysmal atrial fibrillation (HCC)    Pneumonia    PONV (postoperative nausea and vomiting)    Vision disturbance 03/13/2013   Negative neuro MRA MRI    Current Outpatient Medications  Medication Sig Dispense Refill   acetaminophen (TYLENOL) 500 MG tablet Take 325-500 mg by mouth as needed for moderate pain (pain score 4-6).     alendronate (FOSAMAX) 70 MG tablet TAKE 1 TABLET BY MOUTH WEEKLY  WITH A FULL GLASS OF WATER ON AN EMPTY STOMACH 12 tablet 3   apixaban (ELIQUIS) 5 MG TABS tablet TAKE 1 TABLET BY MOUTH TWICE  DAILY 200 tablet 1   Cholecalciferol (VITAMIN D3) 1000 units CAPS Take 1,000 Units by mouth daily.     Coenzyme Q10 (COQ10) 100 MG CAPS Take 100 mg by mouth daily with supper.     diltiazem (CARDIZEM) 30 MG tablet TAKE 1 TABLET EVERY 4 HOURS AS NEEDED FOR BREAKTHROUGH AFIB 45 tablet 1   diltiazem (TIAZAC) 300 MG 24 hr capsule TAKE 1 CAPSULE BY MOUTH DAILY 90 capsule 3   dofetilide (TIKOSYN) 250 MCG capsule Take 1 capsule (250 mcg total) by mouth 2 (two) times daily. 180 capsule  3   LORazepam (ATIVAN) 0.5 MG tablet TAKE 1 TABLET (0.5 MG TOTAL) BY MOUTH DAILY AS NEEDED FOR ANXIETY (WHEN FLYING). 20 tablet 0   metoprolol tartrate (LOPRESSOR) 25 MG tablet TAKE 1 TABLET BY MOUTH IN THE  MORNING AND 2 TABLETS IN THE  EVENING 240 tablet 3   Multiple Vitamins-Minerals (PRESERVISION AREDS PO) Take 1 capsule by mouth in the morning and at bedtime.     potassium chloride (KLOR-CON) 10 MEQ tablet TAKE 2 TABLETS BY MOUTH DAILY 200 tablet 2   pravastatin (PRAVACHOL) 20 MG tablet TAKE 1 TABLET BY MOUTH IN THE  EVENING 90 tablet 3   No current facility-administered medications for this encounter.    ROS- All  systems are reviewed and negative except as per the HPI above  Physical Exam: Vitals:   10/01/23 1508  Weight: 44.3 kg  Height: 5\' 5"  (1.651 m)     Wt Readings from Last 3 Encounters:  10/01/23 44.3 kg  09/03/23 44.5 kg  08/21/23 44.4 kg    GEN: Well nourished, well developed in no acute distress NECK: No JVD; No carotid bruits CARDIAC: Regular rate and rhythm, no murmurs, rubs, gallops RESPIRATORY:  Clear to auscultation without rales, wheezing or rhonchi  ABDOMEN: Soft, non-tender, non-distended EXTREMITIES:  No edema; No deformity     EKG today demonstrates SB Vent. rate 54 BPM PR interval 166 ms QRS duration 90 ms QT/QTcB 462/438 ms   Echo 04/03/23  1. Left ventricular ejection fraction, by estimation, is 60 to 65%. The  left ventricle has normal function. The left ventricle has no regional  wall motion abnormalities. Left ventricular diastolic parameters are  indeterminate.   2. Right ventricular systolic function is normal. The right ventricular  size is normal.   3. Left atrial size was mildly dilated.   4. The mitral valve is normal in structure. Trivial mitral valve  regurgitation.   5. The aortic valve is tricuspid. Aortic valve regurgitation is moderate.  Aortic valve sclerosis is present, with no evidence of aortic valve  stenosis.   6. There is mild dilatation of the ascending aorta, measuring 42 mm.   7. The inferior vena cava is normal in size with greater than 50%  respiratory variability, suggesting right atrial pressure of 3 mmHg.   CHA2DS2-VASc Score = 3  The patient's score is based upon: CHF History: 0 HTN History: 1 Diabetes History: 0 Stroke History: 0 Vascular Disease History: 0 Age Score: 1 Gender Score: 1       ASSESSMENT AND PLAN: Paroxysmal Atrial Fibrillation (ICD10:  I48.0) The patient's CHA2DS2-VASc score is 3, indicating a 3.2% annual risk of stroke.   S/p ablation 02/01/21 and 09/03/23 S/p dofetilide admission  8/2-06/03/21 Patient appears to be maintaining SR Continue dofetilide 250 mcg BID, QT stable Check bmet/mag today Continue Eliquis 5 mg BID with no missed doses for 3 months post ablation Continue metoprolol 25 mg AM and 50 mg PM Continue diltiazem 300 mg daily with 30 mg PRN q 4 hours for heart racing.  Secondary Hypercoagulable State (ICD10:  D68.69) The patient is at significant risk for stroke/thromboembolism based upon her CHA2DS2-VASc Score of 3.  Continue Apixaban (Eliquis).   Valvular heart disease Moderate AR Followed with serial echos  HTN Stable on current regimen  CAD CAC score 293 No anginal symptoms   Follow up with Francis Dowse as scheduled.    [name] PA-C [facility] [address] [place], [place] [address] [phone]

## 2023-10-02 ENCOUNTER — Encounter: Payer: Self-pay | Admitting: Internal Medicine

## 2023-10-02 ENCOUNTER — Telehealth (INDEPENDENT_AMBULATORY_CARE_PROVIDER_SITE_OTHER): Payer: Medicare Other | Admitting: Internal Medicine

## 2023-10-02 ENCOUNTER — Ambulatory Visit: Payer: Medicare Other | Admitting: Internal Medicine

## 2023-10-02 VITALS — BP 120/58 | HR 61 | Ht 65.0 in | Wt 97.8 lb

## 2023-10-02 DIAGNOSIS — E559 Vitamin D deficiency, unspecified: Secondary | ICD-10-CM

## 2023-10-02 DIAGNOSIS — R3129 Other microscopic hematuria: Secondary | ICD-10-CM | POA: Diagnosis not present

## 2023-10-02 DIAGNOSIS — Z7901 Long term (current) use of anticoagulants: Secondary | ICD-10-CM | POA: Diagnosis not present

## 2023-10-02 DIAGNOSIS — M810 Age-related osteoporosis without current pathological fracture: Secondary | ICD-10-CM | POA: Diagnosis not present

## 2023-10-02 DIAGNOSIS — R799 Abnormal finding of blood chemistry, unspecified: Secondary | ICD-10-CM | POA: Diagnosis not present

## 2023-10-02 DIAGNOSIS — R351 Nocturia: Secondary | ICD-10-CM | POA: Diagnosis not present

## 2023-10-02 NOTE — Patient Instructions (Addendum)
Please continue Fosamax 70 mg weekly.  Try to get approx. 1000-1200 mg calcium a day - preferentially from the diet.  Continue vitamin D 3000 units daily.  Please come back for labs.   Please come back for a follow-up appointment in 1 year.  Exercise for Strong Bones (from [facility] [employer]) There are two types of exercises that are important for building and maintaining bone density:  weight-bearing and muscle-strengthening exercises. Weight-bearing Exercises These exercises include activities that make you move against gravity while staying upright. Weight-bearing exercises can be high-impact or low-impact. High-impact weight-bearing exercises help build bones and keep them strong. If you have broken a bone due to osteoporosis or are at risk of breaking a bone, you may need to avoid high-impact exercises. If you're not sure, you should check with your healthcare provider. Examples of high-impact weight-bearing exercises are: Dancing Doing high-impact aerobics Hiking Jogging/running Jumping Rope Stair climbing Tennis Low-impact weight-bearing exercises can also help keep bones strong and are a safe alternative if you cannot do high-impact exercises. Examples of low-impact weight-bearing exercises are: Using elliptical training machines Doing low-impact aerobics Using stair-step machines Fast walking on a treadmill or outside Muscle-Strengthening Exercises These exercises include activities where you move your body, a weight or some other resistance against gravity. They are also known as resistance exercises and include: Lifting weights Using elastic exercise bands Using weight machines Lifting your own body weight Functional movements, such as standing and rising up on your toes Yoga and Pilates can also improve strength, balance and flexibility. However, certain positions may not be safe for people with osteoporosis or those at increased risk of broken bones. For  example, exercises that have you bend forward may increase the chance of breaking a bone in the spine. A physical therapist should be able to help you learn which exercises are safe and appropriate for you. Non-Impact Exercises Non-impact exercises can help you to improve balance, posture and how well you move in everyday activities. These exercises can also help to increase muscle strength and decrease the risk of falls and broken bones. Some of these exercises include: Balance exercises that strengthen your legs and test your balance, such as Tai Chi, can decrease your risk of falls. Posture exercises that improve your posture and reduce rounded or "sloping" shoulders can help you decrease the chance of breaking a bone, especially in the spine. Functional exercises that improve how well you move can help you with everyday activities and decrease your chance of falling and breaking a bone. For example, if you have trouble getting up from a chair or climbing stairs, you should do these activities as exercises. A physical therapist can teach you balance, posture and functional exercises. Starting a New Exercise Program If you haven't exercised regularly for a while, check with your healthcare provider before beginning a new exercise program--particularly if you have health problems such as heart disease, diabetes or high blood pressure. If you're at high risk of breaking a bone, you should work with a physical therapist to develop a safe exercise program. Once you have your healthcare provider's approval, start slowly. If you've already broken bones in the spine because of osteoporosis, be very careful to avoid activities that require reaching down, bending forward, rapid twisting motions, heavy lifting and those that increase your chance of a fall. As you get started, your muscles may feel sore for a day or two after you exercise. If soreness lasts longer, you may be working too hard and  need to ease up.  Exercises should be done in a pain-free range of motion.  How Much Exercise Do You Need? Weight-bearing exercises 30 minutes on most days of the week. Do a 30-minutesession or multiple sessions spread out throughout the day. The benefits to your bones are the same.   Muscle-strengthening exercises Two to three days per week. If you don't have much time for strengthening/resistance training, do small amounts at a time. You can do just one body part each day. For example do arms one day, legs the next and trunk the next. You can also spread these exercises out during your normal day.  Balance, posture and functional exercises Every day or as often as needed. You may want to focus on one area more than the others. If you have fallen or lose your balance, spend time doing balance exercises. If you are getting rounded shoulders, work more on posture exercises. If you have trouble climbing stairs or getting up from the couch, do more functional exercises. You can also perform these exercises at one time or spread them during your day. Work with a phyiscal therapist to learn the right exercises for you.

## 2023-10-02 NOTE — Progress Notes (Signed)
Patient ID: [name] [name], female   DOB: Nov 02, 1950, 72 y.o.   MRN: [id number]   HPI  [name] [name] is a 72 y.o.-year-old pleasant female, returning for follow-up for osteoporosis.  Last visit 1 year ago. She has Medicare.  Interim history: No falls or fractures since last visit. No dizziness/vertigo/orthostasis/poor vision. At last visit, she had hip pain >> started PT >> worse, she started to exercise more: Walking, gym:3x a week.  Now resolved. She recently had had a cardiac ablation >> worked.  Reviewed history: She was diagnosed with osteoporosis in 2017.  Reviewed previous DXA scan reports:  10/10/2022 ([place]) Lumbar spine L1-L3 Femoral neck (FN)  T-score   -2.0 RFN: -2.3 LFN: -2.5  Change in BMD from previous DXA test (%) Up 1.9% Up 0.1%  (*) statistically significant  09/28/2020 ([place]) Lumbar spine L1-L4 Femoral neck (FN)  T-score  -1.9 RFN: -2.5 LFN: -2.3  Change in BMD from previous DXA test (%) Down 2.% Up 2.6%   09/23/2018 ([place]) Lumbar spine L1-L4 Femoral neck (FN)  T-score  -1.7 RFN: -2.7 LFN: -2.3  Change in BMD from previous DXA test (%) Up 1.4% Down 0.4%   Date L1-L4 T score FN T score FRAX   07/27/2016  -1.8 (-3.4%*) RFN: -2.6 (-10.2%*) LFN: -2.4   09/30/2010  -1.6 RFN: -2.0 LFN: -1.8 10 year MOF: 11.9%  10 year hip fracture: 2.2%    No fracture since last visitshe has a history of fract, however, ures: 1. collarbone - ~2010 - fell in a parking lot 2. L foot fx and sprained ankle - few years ago - tripped and fell 3. R foot fx and sprained ankle - 10/2015 She had pain in L shin >> saw Dr. Melene Muller >> had Xrays >> osteoma.  Pain improved.  Reviewed previous osteoporosis treatments: - Fosamax for 5 years  - up to 2011 - We restarted Fosamax 70 mg weekly in 12/2016.  She tolerates this well  She has a history of vitamin D insufficiency: Lab Results  Component Value Date   VD25OH 52.0 10/05/2022   VD25OH 42.31 09/29/2021   VD25OH 44.3  09/28/2020   VD25OH 57.64 11/20/2019   VD25OH 43.25 09/23/2018   VD25OH 40.16 10/19/2017   VD25OH 31.41 12/20/2016   VD25OH 28.88 (L) 09/14/2016   She is on 2000 units vitamin D daily . She was on calcium and Citracal - stopped since last OV.  She was walking and also doing Zumba, strength training 1-2 times a week but not at last visit.  Since then, she started physical therapy and now continues to exercise 30 min 2 times a week: Walking, some strength exercises.  She is not on vitamin A.  Menopause was in her late 68s.  She was on HRT.  Pt does have a FH of osteoporosis in mother - kyphosis.  No fractures.  No history of kidney stones.  No hyper or hypocalcemia or hyperparathyroidism: Lab Results  Component Value Date   PTH 59 09/14/2016   CALCIUM 9.9 10/01/2023   CALCIUM 9.8 08/21/2023   CALCIUM 10.7 (H) 04/26/2023   CALCIUM 9.9 04/09/2023   CALCIUM 10.6 (H) 10/05/2022   CALCIUM 10.2 04/17/2022   CALCIUM 10.5 01/11/2022   CALCIUM 9.4 10/18/2021   CALCIUM 9.5 07/05/2021   CALCIUM 8.7 (L) 06/24/2021   No history of thyrotoxicosis: Lab Results  Component Value Date   TSH 1.17 01/11/2022   TSH 0.891 06/24/2021   TSH 0.82 09/28/2020   TSH 0.867 10/06/2019  TSH 0.80 05/17/2018   No history of CKD: Lab Results  Component Value Date   BUN 29 (H) 10/01/2023   CREATININE 0.77 10/01/2023   She is on flecainide, metoprolol, Eliquis for paroxysmal A. fib.  She continues to go to the A. fib clinic.  She noticed that after stopping Singulair, she had less A. fib episodes. She had pelvic training at [facility].   ROS: + see HPI  I reviewed pt's medications, allergies, PMH, social hx, family hx, and changes were documented in the history of present illness. Otherwise, unchanged from my initial visit note.  Past Medical History:  Diagnosis Date   Allergic rhinitis    Aortic insufficiency    CAT SCRATCH 09/10/2009   Qualifier: Diagnosis of  By: Fabian Sharp MD, Neta Mends     Clavicle fracture 12/2006   left   Diverticulosis    HTN (hypertension)    Osteopenia    Paroxysmal atrial fibrillation (HCC)    Pneumonia    PONV (postoperative nausea and vomiting)    Vision disturbance 03/13/2013   Negative neuro MRA MRI   Past Surgical History:  Procedure Laterality Date   ABDOMINAL HYSTERECTOMY     pt denies 04/15/14   APPENDECTOMY     ATRIAL FIBRILLATION ABLATION N/A 02/01/2021   Procedure: ATRIAL FIBRILLATION ABLATION;  Surgeon: Hillis Range, MD;  Location: [facility];  Service: Cardiovascular;  Laterality: N/A;   ATRIAL FIBRILLATION ABLATION N/A 09/03/2023   Procedure: ATRIAL FIBRILLATION ABLATION;  Surgeon: Regan Lemming, MD;  Location: [facility];  Service: Cardiovascular;  Laterality: N/A;   BRONCHIAL WASHINGS  09/05/2021   Procedure: BRONCHIAL WASHINGS;  Surgeon: Leslye Peer, MD;  Location: [facility];  Service: Cardiopulmonary;;   LAPAROSCOPIC SALPINGO OOPHERECTOMY Right    TONSILLECTOMY AND ADENOIDECTOMY     VIDEO BRONCHOSCOPY N/A 09/05/2021   Procedure: VIDEO BRONCHOSCOPY WITHOUT FLUORO;  Surgeon: Leslye Peer, MD;  Location: [facility] ENDOSCOPY;  Service: Cardiopulmonary;  Laterality: N/A;   Social History   Social History   Marital status: Married    Spouse name: N/A   Number of children: 0 - had infertility tx's in her 30s   Occupational History   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     alendronate (FOSAMAX) 70 MG tablet TAKE 1 TABLET BY MOUTH WEEKLY  WITH A FULL GLASS OF WATER  ON AN EMPTY STOMACH 12 tablet 3   apixaban (ELIQUIS) 5 MG TABS tablet TAKE 1 TABLET BY MOUTH TWICE  DAILY 200 tablet 1   Cholecalciferol (VITAMIN D3) 1000 units CAPS Take 1,000 Units by mouth daily.     Coenzyme Q10 (COQ10) 100 MG CAPS Take 100 mg by mouth daily with supper.     diltiazem (CARDIZEM) 30 MG tablet TAKE 1 TABLET EVERY 4 HOURS AS NEEDED FOR BREAKTHROUGH AFIB 45 tablet 1   diltiazem (TIAZAC) 300 MG 24 hr capsule TAKE 1 CAPSULE BY MOUTH  DAILY 90 capsule 3   dofetilide (TIKOSYN) 250 MCG capsule Take 1 capsule (250 mcg total) by mouth 2 (two) times daily. 180 capsule 3   LORazepam (ATIVAN) 0.5 MG tablet TAKE 1 TABLET (0.5 MG TOTAL) BY MOUTH DAILY AS NEEDED FOR ANXIETY (WHEN FLYING). 20 tablet 0   metoprolol tartrate (LOPRESSOR) 25 MG tablet TAKE 1 TABLET BY MOUTH IN THE  MORNING AND 2 TABLETS IN THE  EVENING 240 tablet 3   Multiple Vitamins-Minerals (PRESERVISION AREDS PO) Take 1 capsule by mouth in the morning and at bedtime.     potassium chloride (KLOR-CON) 10 MEQ tablet TAKE 2 TABLETS BY MOUTH DAILY 200 tablet 2   pravastatin (PRAVACHOL) 20 MG tablet TAKE 1 TABLET BY MOUTH IN THE  EVENING 90 tablet 3   No current facility-administered medications on file prior to visit.   Allergies  Allergen Reactions   Latex Hives   Other Rash and Other (See Comments)    Adhesive pads left on skin for extended periods = BURNS AND RASHES ON THE SKIN!!!!   Codeine Phosphate Other (See Comments)    Drowsiness    Demerol [Meperidine] Nausea And Vomiting   Protonix [Pantoprazole] Rash   Sulfa Antibiotics Rash   Tens Therapy Replace Back Pads Itching and Rash   Family History  Problem Relation Age of Onset   Scleroderma Mother    Hyperlipidemia Mother    Coronary artery disease Father 74   Esophageal cancer Paternal Grandmother    PE: BP (!) 120/58   Pulse 61   Ht 5\' 5"  (1.651 m)   Wt 97 lb 12.8 oz (44.4 kg)   SpO2 97%   BMI 16.27 kg/m  Wt Readings from Last 10 Encounters:   10/02/23 97 lb 12.8 oz (44.4 kg)  10/01/23 97 lb 9.6 oz (44.3 kg)  09/03/23 98 lb (44.5 kg)  08/21/23 97 lb 12.8 oz (44.4 kg)  08/10/23 97 lb 8 oz (44.2 kg)  06/28/23 97 lb 12.8 oz (44.4 kg)  04/09/23 98 lb (44.5 kg)  04/06/23 98 lb 3.2 oz (44.5 kg)  12/06/22 95 lb 14.4 oz (43.5 kg)  10/06/22 95 lb (43.1 kg)   Constitutional: normal weight, in NAD Eyes:  EOMI, no exophthalmos ENT: no neck masses, no cervical lymphadenopathy Cardiovascular: RRR, No MRG Respiratory: CTA B Musculoskeletal: no deformities Skin:no rashes Neurological: no tremor with outstretched hands  Assessment: 1. Osteoporosis  2. Vit D insufficiency  Plan: 1. Osteoporosis -Likely postmenopausal/age-related and she also has a family history of osteoporosis -She initially agreed to start Prolia but this was too expensive so we ended up starting Fosamax 70 mg weekly in 2018.  She tolerates this well, without hip/thigh/jaw pain. -She previously had pain in the left shin and was found to have an osteoma with some muscle edema around it.  Pain improved afterwards. -We reviewed together her bone density scan from 08/2020, which showed improved T-scores at the right femoral neck, but still in the osteoporotic range and slightly low T-scores at the spine.  The left femoral neck T-scores were stable.  We also reviewed the results of her latest DXA scan from 10/10/2022 which showed essentially stable scores, but still in the osteoporotic range. -She completed ~6 years of Fosamax.  We discussed about continuing Fosamax for now. -No falls or fractures since last visit.  We again discussed about fall precautions -We discussed about the need to continue weightbearing exercises (I also previously recommended adding weights when walking, walking downhill, walking after rather  than before a meal).  As of now, she is mostly walking.  I recommended to exercise 30 minutes a day for at least 5 days a week. -Latest calcium and kidney  function were normal -she was very worried about the event, but I reassured her that it is not a worrisome finding.  I did advise her to stay hydrated. Lab Results  Component Value Date   CALCIUM 9.9 10/01/2023   Lab Results  Component Value Date   BUN 29 (H) 10/01/2023   Lab Results  Component Value Date   CREATININE 0.77 10/01/2023  -I will see her back in 1 year.  At that time, we will order her new bone density.  2. Vit D insufficiency -She continues on vitamin D but unclear if she is still taking 3000 units daily-she will look at home and let me know -Vitamin D level was normal at last check in 09/2022, at 24 -We will repeat her vitamin D level today  - She needs to lay down for labs (2/2 nausea).  Orders Placed This Encounter  Procedures   VITAMIN D 25 Hydroxy (Vit-D Deficiency, Fractures)   Office Visit on 10/02/2023  Component Date Value Ref Range Status   Vit D, 25-Hydroxy 10/02/2023 36  30 - 100 ng/mL Final   Comment: Vitamin D Status         25-OH Vitamin D: . Deficiency:                    <20 ng/mL Insufficiency:             20 - 29 ng/mL Optimal:                 > or = 30 ng/mL . For 25-OH Vitamin D testing on patients on  D2-supplementation and patients for whom quantitation  of D2 and D3 fractions is required, the [facility](TM) 25-OH VIT D, (D2,D3), LC/MS/MS is recommended: order  code 16109 (patients >48yrs). . See Note 1 . Note 1 . For additional information, please refer to  http://education.[facility].com/faq/FAQ199  (This link is being provided for informational/ educational purposes only.)     Msg received after the visit: Hi, Dr. Elvera Lennox:   Following up on today's visit, there are the Vitamin D3 and calcium suppelments that I take/don't take:   1. Vitamin D3: I take Nature Made, 2 softgels totaling 2,000 IUs each evening. Each softgel is 1,000 IUs or 25 mcg.    2. I DON'T CURRENTLY TAKE Citracal, per your advice months ago,  because  blood tests showed that my calcium level  was normal. Two caplets of Citracal has 1,000 IUs of Vitamin D and 65 mg of calcium.    Should I change my dosages? Also, which foods are the best sources of calcium? Broccoli? Whole milk?    Thank you for your expertise.   Dasiya Helder, [phone]   Will advise her to continue the current regimen.  Carlus Pavlov, MD PhD [facility]

## 2023-10-02 NOTE — Progress Notes (Signed)
Virtual Visit via Video Note  I connected with Wilford Sports on 10/02/23 at  4:00 PM EST by a video enabled telemedicine application and verified that I am speaking with the correct person using two identifiers. Location patient: home Location provider:work ffice Persons participating in the virtual visit: patient, provider  Patient aware  of the limitations of evaluation and management by telemedicine and  availability of in person appointments. and agreed to proceed.   HPI: [name] [name] presents for video visit concern about elevated bun on lab   1 mos after ablation.  No change in clinical status. Had uti sx in past and neg cx but micro heme.  Gets chronic nocturia 2-4 x  no other reason. No change  meds .   ROS: See pertinent positives and negatives per HPI. No recent episodes of  a fib  , cautiously optimistic about  success of procedure Remote hx of elvated calcium and neg pth eval.   Past Medical History:  Diagnosis Date   Allergic rhinitis    Aortic insufficiency    CAT SCRATCH 09/10/2009   Qualifier: Diagnosis of  By: Fabian Sharp MD, Neta Mends    Clavicle fracture 12/2006   left   Diverticulosis    HTN (hypertension)    Osteopenia    Paroxysmal atrial fibrillation (HCC)    Pneumonia    PONV (postoperative nausea and vomiting)    Vision disturbance 03/13/2013   Negative neuro MRA MRI    Past Surgical History:  Procedure Laterality Date   ABDOMINAL HYSTERECTOMY     pt denies 04/15/14   APPENDECTOMY     ATRIAL FIBRILLATION ABLATION N/A 02/01/2021   Procedure: ATRIAL FIBRILLATION ABLATION;  Surgeon: Hillis Range, MD;  [facility];  Service: Cardiovascular;  Laterality: N/A;   ATRIAL FIBRILLATION ABLATION N/A 09/03/2023   Procedure: ATRIAL FIBRILLATION ABLATION;  Surgeon: Regan Lemming, MD;  [facility]: [facility];  Service: Cardiovascular;  Laterality: N/A;   BRONCHIAL WASHINGS  09/05/2021   Procedure: BRONCHIAL WASHINGS;  Surgeon: Leslye Peer,  MD;  Location: [facility];  Service: Cardiopulmonary;;   LAPAROSCOPIC SALPINGO OOPHERECTOMY Right    TONSILLECTOMY AND ADENOIDECTOMY     VIDEO BRONCHOSCOPY N/A 09/05/2021   Procedure: VIDEO BRONCHOSCOPY WITHOUT FLUORO;  Surgeon: Leslye Peer, MD;  [facility] ENDOSCOPY;  Service: Cardiopulmonary;  Laterality: N/A;    Family History  Problem Relation Age of Onset   Scleroderma Mother    Hyperlipidemia Mother    Coronary artery disease Father 57   Esophageal cancer Paternal Grandmother     Social History   Tobacco Use   Smoking status: Never   Smokeless tobacco: Never   Tobacco comments:    Never smoked 06/28/23  Substance Use Topics   Alcohol use: Not Currently   Drug use: No      Current Outpatient Medications:    acetaminophen (TYLENOL) 500 MG tablet, Take 325-500 mg by mouth as needed for moderate pain (pain score 4-6)., Disp: , Rfl:    alendronate (FOSAMAX) 70 MG tablet, TAKE 1 TABLET BY MOUTH WEEKLY  WITH A FULL GLASS OF WATER ON AN EMPTY STOMACH, Disp: 12 tablet, Rfl: 3   apixaban (ELIQUIS) 5 MG TABS tablet, TAKE 1 TABLET BY MOUTH TWICE  DAILY, Disp: 200 tablet, Rfl: 1   Cholecalciferol (VITAMIN D3) 1000 units CAPS, Take 1,000 Units by mouth daily., Disp: , Rfl:    Coenzyme Q10 (COQ10) 100 MG CAPS, Take 100 mg by mouth daily  with supper., Disp: , Rfl:    diltiazem (CARDIZEM) 30 MG tablet, TAKE 1 TABLET EVERY 4 HOURS AS NEEDED FOR BREAKTHROUGH AFIB, Disp: 45 tablet, Rfl: 1   diltiazem (TIAZAC) 300 MG 24 hr capsule, TAKE 1 CAPSULE BY MOUTH DAILY, Disp: 90 capsule, Rfl: 3   dofetilide (TIKOSYN) 250 MCG capsule, Take 1 capsule (250 mcg total) by mouth 2 (two) times daily., Disp: 180 capsule, Rfl: 3   LORazepam (ATIVAN) 0.5 MG tablet, TAKE 1 TABLET (0.5 MG TOTAL) BY MOUTH DAILY AS NEEDED FOR ANXIETY (WHEN FLYING)., Disp: 20 tablet, Rfl: 0   metoprolol tartrate (LOPRESSOR) 25 MG tablet, TAKE 1 TABLET BY MOUTH IN THE  MORNING AND 2 TABLETS IN THE  EVENING, Disp: 240 tablet,  Rfl: 3   Multiple Vitamins-Minerals (PRESERVISION AREDS PO), Take 1 capsule by mouth in the morning and at bedtime., Disp: , Rfl:    potassium chloride (KLOR-CON) 10 MEQ tablet, TAKE 2 TABLETS BY MOUTH DAILY, Disp: 200 tablet, Rfl: 2   pravastatin (PRAVACHOL) 20 MG tablet, TAKE 1 TABLET BY MOUTH IN THE  EVENING, Disp: 90 tablet, Rfl: 3  EXAM: BP Readings from Last 3 Encounters:  10/02/23 (!) 120/58  10/01/23 (!) 130/58  09/03/23 130/68    VITALS per patient if applicable:  GENERAL: alert, oriented, appears well and in no acute distress  HEENT: atraumatic, conjunttiva clear, no obvious abnormalities on inspection of external nose and ears  NECK: normal movements of the head and neck  LUNGS: on inspection no signs of respiratory distress, breathing rate appears normal, no obvious gross SOB, gasping or wheezing  CV: no obvious cyanosis  MS: moves all visible extremities without noticeable abnormality  PSYCH/NEURO: pleasant and cooperative, no obvious depression or anxiety, speech and thought processing grossly intact Lab Results  Component Value Date   WBC 5.3 08/21/2023   HGB 14.0 08/21/2023   HCT 43.3 08/21/2023   PLT 242 08/21/2023   GLUCOSE 90 10/01/2023   CHOL 136 06/27/2022   TRIG 64 06/27/2022   HDL 72 06/27/2022   LDLDIRECT 131.0 09/03/2009   LDLCALC 51 06/27/2022   ALT 14 01/11/2022   AST 19 01/11/2022   NA 140 10/01/2023   K 4.0 10/01/2023   CL 107 10/01/2023   CREATININE 0.77 10/01/2023   BUN 29 (H) 10/01/2023   CO2 25 10/01/2023   TSH 1.17 01/11/2022   Lab record review ASSESSMENT AND PLAN:  Discussed the following assessment and plan:    ICD-10-CM   1. Elevated BUN  R79.9 Basic metabolic panel    Urine Microscopic Only    Cystatin C with Glomerular Filtration Rate, Estimated (eGFR)    Microalbumin / creatinine urine ratio   egfr over 60 nl  non fasting doen 1 month after ablation    2. Nocturia  R35.1 Basic metabolic panel    Urine Microscopic  Only    Cystatin C with Glomerular Filtration Rate, Estimated (eGFR)    Microalbumin / creatinine urine ratio   2-4 x per night, chroniic  no osa    3. Microscopic hematuria  R31.29 Basic metabolic panel    Urine Microscopic Only    Cystatin C with Glomerular Filtration Rate, Estimated (eGFR)    Microalbumin / creatinine urine ratio   with uti sx neg cx  due for repeat    4. Chronic anticoagulation  Z79.01 Basic metabolic panel    Urine Microscopic Only    Cystatin C with Glomerular Filtration Rate, Estimated (eGFR)    Microalbumin /  creatinine urine ratio     Concern about lab done by cards /EP team   no bleeding  so far no recurrence of afib post procedure . May be clinically insignificant  Record review    plan repeat bmp hydrated  and repeat micro urine  . Counseled.   Expectant management and discussion of plan and treatment with opportunity to ask questions and all were answered. The patient agreed with the plan and demonstrated an understanding of the instructions.   Advised to call back or seek an in-person evaluation if worsening  or having  further concerns  in interim. Return for depending on results and  when planned .   Berniece Andreas, MD

## 2023-10-03 LAB — VITAMIN D 25 HYDROXY (VIT D DEFICIENCY, FRACTURES): Vit D, 25-Hydroxy: 36 ng/mL (ref 30–100)

## 2023-10-08 ENCOUNTER — Ambulatory Visit (INDEPENDENT_AMBULATORY_CARE_PROVIDER_SITE_OTHER): Payer: Medicare Other

## 2023-10-08 DIAGNOSIS — I480 Paroxysmal atrial fibrillation: Secondary | ICD-10-CM

## 2023-10-08 NOTE — Progress Notes (Signed)
10/08/2023 Name: [name] [name] MRN: [id number] DOB: August 12, 1951   [name] [name] is a 72 y.o. year old female who was referred for medication management by their primary care provider, [name], Neta Mends, MD. They presented for a face to face visit today.   They were referred to the pharmacist by their PCP for assistance in managing complex medication management    Subjective:  Care Team: Primary Care Provider: Madelin Headings, MD  Medication Access/Adherence  Current Pharmacy:  [facility] ORDER) ELECTRONIC - [place], [place] - [address] [place] [address] Phone: [phone] Fax: [phone]  [facility]) - [place], [place] - [address] [facility] [address] [place] [address] [place] [address] Phone: [phone] Fax: [phone]  [facility] - [place], [place] - [address] [place] [address] Phone: [phone] Fax: [phone]  [id number] - [place], [place] - [address] AT [facility] [address] [place] [address] Phone: [phone] Fax: [phone]  [id number] - , [place] - [address] [place] [address] Phone: [phone] Fax: [phone]  [phone] [place] - [address] [place] [address] Phone: [phone] Fax: [phone]   Patient reports affordability concerns with their medications: No  Patient reports access/transportation concerns to their pharmacy: No  Patient reports adherence concerns with their medications:  No     Atrial Fibrillation:  Current medications: Rate Control: Metoprolol tartrate 50mg  1 tab qam, 2 tabs qpm Rhythm Control: Dofetilide 1 BID, Diltiazem 300mg  1 every day, Diltiazem 30mg  q4h prn breakthrough afib Anticoagulation Regimen: Eliquis 5mg   BID  Medications tried in the past: Flecainide, Propranolol  CHADS2VASC: 3  Current blood pressure/heart rate readings: Brings in an extensive log where she has made note of her readings and afib flares. Has afib several times a week. [place] with an app on her phone called Kadia  Patient denies hypotensive s/sx including dizziness, lightheadedness. Patient denies hypertensive symptoms including headache, chest pain, shortness of breath   Current medication access support: Tikosyn on [facility]  Reports no a fib episodes since ablation procedure last month   Osteoporosis:  Current medications: Alendronate 70mg  once weekly Medications tried in the past: None  Current supplements: Vitamin D 2000 units daily, Calcium - from her diet    Most recent DEXA: 10/10/22, bone health did improve but still at -2.5 for LFN  Current medication access support: None   Objective:   Lab Results  Component Value Date   CREATININE 0.77 10/01/2023   BUN 29 (H) 10/01/2023   NA 140 10/01/2023   K 4.0 10/01/2023   CL 107 10/01/2023   CO2 25 10/01/2023    Lab Results  Component Value Date   CHOL 136 06/27/2022   HDL 72 06/27/2022   LDLCALC 51 06/27/2022   LDLDIRECT 131.0 09/03/2009   TRIG 64 06/27/2022   CHOLHDL 1.9 06/27/2022    Medications Reviewed Today     Reviewed by Sherrill Raring, [name] (Pharmacist) on 10/08/23 at 1018  Med List Status: <None>   Medication Order Taking? Sig Documenting Provider Last Dose Status Informant  acetaminophen (TYLENOL) 500 MG tablet [id number] Yes Take 325-500 mg by mouth as needed for moderate pain (pain score 4-6). [provider] Taking Active Self  Med Note Sherrill Raring   Mon Oct 08, 2023 10:17 AM) Per pt, "been a few months since last dose"  alendronate (FOSAMAX) 70 MG tablet [id number] Yes TAKE 1 TABLET BY MOUTH WEEKLY  WITH A FULL GLASS OF WATER ON AN EMPTY STOMACH Carlus Pavlov, MD Taking Active Self           Med  Note Lenoria Farrier   Fri Aug 24, 2023  2:02 PM) Friday  apixaban (ELIQUIS) 5 MG TABS tablet [id number] Yes TAKE 1 TABLET BY MOUTH TWICE  DAILY Rollene Rotunda, MD Taking Active Self  Cholecalciferol (VITAMIN D3) 1000 units CAPS [id number] Yes Take 1,000 Units by mouth daily. [provider] Taking Active Self  Coenzyme Q10 (COQ10) 100 MG CAPS [id number] Yes Take 100 mg by mouth daily with supper. [provider] Taking Active Self  diltiazem (CARDIZEM) 30 MG tablet [id number]  TAKE 1 TABLET EVERY 4 HOURS AS NEEDED FOR BREAKTHROUGH AFIB [name], [name], PA  Active Self           Med Note Sherrill Raring   Mon Oct 08, 2023 10:18 AM) Has not needed to take since prior to ablation  diltiazem (TIAZAC) 300 MG 24 hr capsule [id number] Yes TAKE 1 CAPSULE BY MOUTH DAILY Rollene Rotunda, MD Taking Active Self  dofetilide (TIKOSYN) 250 MCG capsule [id number] Yes Take 1 capsule (250 mcg total) by mouth 2 (two) times daily. Regan Lemming, MD Taking Active Self  LORazepam (ATIVAN) 0.5 MG tablet [id number]  TAKE 1 TABLET (0.5 MG TOTAL) BY MOUTH DAILY AS NEEDED FOR ANXIETY (WHEN FLYING). Worthy Rancher B, FNP  Active Self           Med Note Barnie Mort Oct 08, 2023 10:18 AM) Has not needed to take for the last few flights, still has some in case  metoprolol tartrate (LOPRESSOR) 25 MG tablet [id number] Yes TAKE 1 TABLET BY MOUTH IN THE  MORNING AND 2 TABLETS IN THE  Charyl Dancer, PA-C Taking Active Self  Multiple Vitamins-Minerals (PRESERVISION AREDS PO) [id number] Yes Take 1 capsule by mouth in the morning and at bedtime. [provider] Taking Active Self  potassium chloride (KLOR-CON) 10 MEQ tablet [id number] Yes TAKE 2 TABLETS BY MOUTH DAILY Rollene Rotunda, MD Taking Active Self  pravastatin (PRAVACHOL) 20 MG tablet [id number] Yes TAKE 1 TABLET BY MOUTH IN THE  Shea Stakes, MD Taking Active Self              Assessment/Plan:   Atrial  Fibrillation: - Currently controlled - Reviewed importance of adherence to anticoagulant for stroke prevention. - Reviewed appropriate blood pressure monitoring technique and reviewed goal blood pressure. Recommended to check home blood pressure and heart rate daily -Continue current medication therapy   Osteoporosis: - Currently appropriately managed - Reviewed recommendation for daily calcium intake of 1200 mg from diet and vitamin D intake of [phone] units - Reviewed benefits of weight bearing exercise -Continue medication therapy per endo, reevaluate alendronate drug holiday in 1 year at their sch appt   Follow Up Plan: 01/02/23  Sherrill Raring, PharmD Clinical Pharmacist [phone]

## 2023-10-09 ENCOUNTER — Encounter: Payer: Self-pay | Admitting: Internal Medicine

## 2023-10-09 DIAGNOSIS — H26492 Other secondary cataract, left eye: Secondary | ICD-10-CM | POA: Diagnosis not present

## 2023-10-09 DIAGNOSIS — H02833 Dermatochalasis of right eye, unspecified eyelid: Secondary | ICD-10-CM | POA: Diagnosis not present

## 2023-10-09 DIAGNOSIS — H02836 Dermatochalasis of left eye, unspecified eyelid: Secondary | ICD-10-CM | POA: Diagnosis not present

## 2023-10-09 DIAGNOSIS — H35373 Puckering of macula, bilateral: Secondary | ICD-10-CM | POA: Diagnosis not present

## 2023-10-09 DIAGNOSIS — H353132 Nonexudative age-related macular degeneration, bilateral, intermediate dry stage: Secondary | ICD-10-CM | POA: Diagnosis not present

## 2023-10-09 LAB — HM DIABETES EYE EXAM

## 2023-10-10 DIAGNOSIS — R0781 Pleurodynia: Secondary | ICD-10-CM | POA: Diagnosis not present

## 2023-10-10 DIAGNOSIS — W19XXXA Unspecified fall, initial encounter: Secondary | ICD-10-CM | POA: Diagnosis not present

## 2023-10-10 DIAGNOSIS — S2241XA Multiple fractures of ribs, right side, initial encounter for closed fracture: Secondary | ICD-10-CM | POA: Diagnosis not present

## 2023-10-15 ENCOUNTER — Ambulatory Visit: Payer: Medicare Other | Admitting: Cardiology

## 2023-11-01 ENCOUNTER — Encounter: Payer: Self-pay | Admitting: Internal Medicine

## 2023-11-01 ENCOUNTER — Other Ambulatory Visit (INDEPENDENT_AMBULATORY_CARE_PROVIDER_SITE_OTHER): Payer: Medicare Other

## 2023-11-01 DIAGNOSIS — R351 Nocturia: Secondary | ICD-10-CM | POA: Diagnosis not present

## 2023-11-01 DIAGNOSIS — Z7901 Long term (current) use of anticoagulants: Secondary | ICD-10-CM

## 2023-11-01 DIAGNOSIS — R3129 Other microscopic hematuria: Secondary | ICD-10-CM

## 2023-11-01 DIAGNOSIS — R799 Abnormal finding of blood chemistry, unspecified: Secondary | ICD-10-CM | POA: Diagnosis not present

## 2023-11-01 LAB — URINALYSIS, MICROSCOPIC ONLY: RBC / HPF: NONE SEEN (ref 0–?)

## 2023-11-01 LAB — BASIC METABOLIC PANEL
BUN: 29 mg/dL — ABNORMAL HIGH (ref 6–23)
CO2: 29 meq/L (ref 19–32)
Calcium: 9.7 mg/dL (ref 8.4–10.5)
Chloride: 104 meq/L (ref 96–112)
Creatinine, Ser: 0.83 mg/dL (ref 0.40–1.20)
GFR: 71.12 mL/min (ref >60.00)
Glucose, Bld: 99 mg/dL (ref 70–99)
Potassium: 3.9 meq/L (ref 3.5–5.1)
Sodium: 140 meq/L (ref 135–145)

## 2023-11-01 LAB — MICROALBUMIN / CREATININE URINE RATIO
Creatinine,U: 132.7 mg/dL
Microalb Creat Ratio: 2.2 mg/g (ref 0.0–30.0)
Microalb, Ur: 3 mg/dL — ABNORMAL HIGH (ref 0.0–1.9)

## 2023-11-02 NOTE — Telephone Encounter (Signed)
 Spoke to pt. Inform pt provider is out today and her lab has not been review yet and one lab is not back. Ask pt if she is okay to wait until Tuesday when provider is back for lab result review. Pt states that would be fine.    Please advise pt's lab result.

## 2023-11-02 NOTE — Telephone Encounter (Signed)
 Attempted to reach pt. Left a voicemail to call us back.

## 2023-11-05 ENCOUNTER — Encounter: Payer: Self-pay | Admitting: Internal Medicine

## 2023-11-05 DIAGNOSIS — R82998 Other abnormal findings in urine: Secondary | ICD-10-CM

## 2023-11-05 NOTE — Progress Notes (Signed)
 Urine protein in normal range  reassuring and no evidence of active kidney disease.  Urine shows no blood ; interesting that there are calcium   oxylate  crystal but nothing to do  at this time except stay hydrated . Possible at risk to get a renal stone but you dont have clinical evidence of  a stone. I suggest we repeat the microscopic  urinalysis in   3-4 weeks to be sure stable . And we can order a renal /kidney ultrasound  to get non radiation picture of kidneys . None of this is alarming but getting more information is helpful K if she agrees please place these orders.    [email or web address]

## 2023-11-07 ENCOUNTER — Encounter: Payer: Self-pay | Admitting: Internal Medicine

## 2023-11-07 LAB — CYSTATIN C WITH GLOMERULAR FILTRATION RATE, ESTIMATED (EGFR)
CYSTATIN C: 0.97 mg/L (ref 0.52–1.10)
eGFR: 72 mL/min/{1.73_m2} (ref >=60)

## 2023-11-07 NOTE — Telephone Encounter (Signed)
 No need to change plans this is not urgent . K can you make sure orders are placed with her schedule  of  dates she is not avaiable . thanks

## 2023-11-08 NOTE — Progress Notes (Signed)
 Pt saw message through mychart. Please see message.

## 2023-11-09 ENCOUNTER — Other Ambulatory Visit: Payer: Medicare Other

## 2023-11-11 NOTE — Progress Notes (Signed)
 Normal cystatin c  normal gfr  means normal kidney function by this test

## 2023-11-11 NOTE — Telephone Encounter (Signed)
 Means Normal gfr .... normal kidney function.

## 2023-11-12 ENCOUNTER — Ambulatory Visit
Admission: RE | Admit: 2023-11-12 | Discharge: 2023-11-12 | Discharge status: Home / Self Care | Payer: Medicare Other | Source: Ambulatory Visit | Attending: Internal Medicine | Admitting: Internal Medicine

## 2023-11-12 ENCOUNTER — Encounter: Payer: Self-pay | Admitting: Internal Medicine

## 2023-11-12 DIAGNOSIS — D4102 Neoplasm of uncertain behavior of left kidney: Secondary | ICD-10-CM | POA: Diagnosis not present

## 2023-11-12 DIAGNOSIS — N2889 Other specified disorders of kidney and ureter: Secondary | ICD-10-CM | POA: Diagnosis not present

## 2023-11-12 DIAGNOSIS — R82998 Other abnormal findings in urine: Secondary | ICD-10-CM

## 2023-11-13 ENCOUNTER — Other Ambulatory Visit: Payer: Self-pay

## 2023-11-13 ENCOUNTER — Encounter: Payer: Self-pay | Admitting: Internal Medicine

## 2023-11-13 DIAGNOSIS — N2889 Other specified disorders of kidney and ureter: Secondary | ICD-10-CM

## 2023-11-13 DIAGNOSIS — N281 Cyst of kidney, acquired: Secondary | ICD-10-CM

## 2023-11-13 NOTE — Telephone Encounter (Signed)
 Spoke to pt. Inform pt that once we placed the order in, someone will her to schedule her appointment. There is no deadline or how soon she will need it. Pt states she will take earliest appt. Inform pt that would be good.   No further action is needed.

## 2023-11-13 NOTE — Progress Notes (Signed)
 So there is a small cyst  that is uncharacterized so radiology  advises more  defined imaging to make sure cyst is  a benign process .  So  advise ordering renal   MRI  to to assess  define complex renal cyst. .   The cyst is  probably not causing any problems .  At some point we may involve  urology ( non urgent)  to follow up .  Karpuih please order  renal mri to evaluated renal cyst ..thanks

## 2023-11-14 NOTE — Telephone Encounter (Signed)
 Spoke to pt yesterday about the sedation. Ask pt she would like for us  to send in for her. Pt stated she was good at the moment and will let us  know if she needs it.

## 2023-11-14 NOTE — Telephone Encounter (Signed)
 We can orxder  pre procedure ativan  or xanax   but also techs need to address the  ear plugs or other optoins    since would be over abdomen hopefully not as noisy and uncomfortable

## 2023-11-21 NOTE — Telephone Encounter (Signed)
Spoke to pt and went over with her from last conversation stating that the order was sent. However after checking the order, order was no found. The mri order is placed. Double check under the "image" tab, order is found.   Inform pt that the order is placed and someone should contact her to schedule an appointment. Also she can discuss with them on what to expect for mri. Pt verbalized understanding.

## 2023-11-26 NOTE — Telephone Encounter (Signed)
Spoke to pt.   Relay provider's message to patient. Verbalized understanding.   Review and went over pt's question from mychart. Confirm to pt that she does have lab appt on 11/29/2023. And inform pt that the 4 hour fasting prior to MRI should not be conflicted.   Pt verbalized understanding.

## 2023-11-29 ENCOUNTER — Other Ambulatory Visit (INDEPENDENT_AMBULATORY_CARE_PROVIDER_SITE_OTHER): Payer: Medicare Other

## 2023-11-29 DIAGNOSIS — R82998 Other abnormal findings in urine: Secondary | ICD-10-CM

## 2023-11-29 LAB — URINALYSIS, MICROSCOPIC ONLY: RBC / HPF: NONE SEEN (ref 0–?)

## 2023-11-30 ENCOUNTER — Encounter: Payer: Self-pay | Admitting: Internal Medicine

## 2023-12-03 NOTE — Progress Notes (Signed)
 Cardiology Office Note:  .   Date:  12/03/2023  ID:  [name] [name], DOB [birth year], MRN [id number] PCP: Charlett Apolinar POUR, MD  [facility] Providers Cardiologist:  Lynwood Schilling, MD Electrophysiologist:  Will Gladis Norton, MD {  History of Present Illness: [name]   VIANNEY [name] is a 73 y.o. female w/PMHx of coronary Ca++ on CT (neg stress myoview 2018), HLD, HTN, VHD (w/AI), Afib  He last saw Dr. Norton in the office Oct 2024, recurrent Afib, symptomatic with them despite AAD and planned for re-do ablation  AFib ablation 09/03/23  Afib clinic visit 10/01/23, no symptoms of AFib, no procedural complications/concerns, no changes made   Today's visit is scheduled as her post ablation visit  ROS:   She feels well No post procedure/site concerns Her AFib is not gone, but the burden of her palpitations are much improved She is quite active, very busy, attends an exercise class 2x/week reports very good exertional capacity No CP, SOB, DOE No near syncope or syncope No bleeding or signs of bleeding  Seeing her PMD,  pending further w/u of possible renal cys, and findings of crystals in her urine Occasionally she sees the potassium pill incompletely digested in her stool  Reports good medication compliance  Arrhythmia/AAD hx Flecainide  2022 02/01/21: AFib ablation, CTI ablation as well NOTED: Multiple atypical atrial flutter circuits observed  Flecainide  stopped July 2022 w/retirn of AFib/fkutter Dr. Kelsie Tikosyn  started Aug 2022 09/23/23: AFib ablation (PFA)   Studies Reviewed: [name]    EKG done today and reviewed by myself:  SR 63bpm, QTc   09/03/23: EPS/ablation CONCLUSIONS: 1. Sinus rhythm upon presentation.   2. Successful electrical isolation and anatomical encircling of all four pulmonary veins with pulse field ablation. 3. Ablation of posterior wall with pulse field ablation 4. No early apparent complications.  04/03/23: TTE 1. Left ventricular ejection  fraction, by estimation, is 60 to 65%. The  left ventricle has normal function. The left ventricle has no regional  wall motion abnormalities. Left ventricular diastolic parameters are  indeterminate.   2. Right ventricular systolic function is normal. The right ventricular  size is normal.   3. Left atrial size was mildly dilated.   4. The mitral valve is normal in structure. Trivial mitral valve  regurgitation.   5. The aortic valve is tricuspid. Aortic valve regurgitation is moderate.  Aortic valve sclerosis is present, with no evidence of aortic valve  stenosis.   6. There is mild dilatation of the ascending aorta, measuring 42 mm.   7. The inferior vena cava is normal in size with greater than 50%  respiratory variability, suggesting right atrial pressure of 3 mmHg.    02/01/2021: EPS/ablation POSTPROCEDURE DIAGNOSES: 1. Paroxysmal atrial fibrillation. 2. Isthmus dependant right atrial flutter 3. Multiple atypical atrial flutter circuits  CONCLUSIONS: 1. Sinus rhythm upon presentation.   2. Intracardiac echo reveals a moderate sized left atrium. 3. Successful electrical isolation and anatomical encircling of all four pulmonary veins with radiofrequency current. 4. Additional mapping and ablation within the left atrium due to persistence of atrial fibrillation with a posterior wall box demonstrated  5. Cavo-tricuspid isthmus ablation performed 6. Multiple atypical atrial flutter circuits observed 7. Cardioversion performed 8. No early apparent complications.   Risk Assessment/Calculations:    Physical Exam:   VS:  There were no vitals taken for this visit.   Wt Readings from Last 3 Encounters:  10/02/23 97 lb 12.8 oz (44.4 kg)  10/01/23 97 lb  9.6 oz (44.3 kg)  09/03/23 98 lb (44.5 kg)    GEN: Well nourished, well developed in no acute distress NECK: No JVD; No carotid bruits CARDIAC: RRR, no murmurs, rubs, gallops RESPIRATORY:  CTA b/l without rales, wheezing or rhonchi   ABDOMEN: Soft, non-tender, non-distended EXTREMITIES: No edema; No deformity    ASSESSMENT AND PLAN: .    paroxysmal AFib CHA2DS2Vasc is 3, on Eliquis , appropriately dosed Tikosyn  w/stable QTc Much improved burden by symptoms  Recent labs stable Tikosyn  teaching re-enforced Med list looks OK  Follow burden of palpitations no past 3 mo mark Continue meds  HTN Looks good  Secondary hypercoagulable state 2/2 AFib       Dispo: back in 3 mo, sooner if needed  Signed, Charlies Macario Arthur, PA-C

## 2023-12-05 ENCOUNTER — Encounter: Payer: Self-pay | Admitting: Physician Assistant

## 2023-12-05 ENCOUNTER — Ambulatory Visit: Payer: Medicare Other | Attending: Physician Assistant | Admitting: Physician Assistant

## 2023-12-05 VITALS — BP 122/64 | HR 63 | Ht 64.5 in | Wt 99.6 lb

## 2023-12-05 DIAGNOSIS — I10 Essential (primary) hypertension: Secondary | ICD-10-CM | POA: Diagnosis not present

## 2023-12-05 DIAGNOSIS — I480 Paroxysmal atrial fibrillation: Secondary | ICD-10-CM | POA: Diagnosis not present

## 2023-12-05 DIAGNOSIS — Z79899 Other long term (current) drug therapy: Secondary | ICD-10-CM | POA: Diagnosis not present

## 2023-12-05 DIAGNOSIS — Z5181 Encounter for therapeutic drug level monitoring: Secondary | ICD-10-CM

## 2023-12-05 DIAGNOSIS — D6869 Other thrombophilia: Secondary | ICD-10-CM | POA: Diagnosis not present

## 2023-12-05 NOTE — Patient Instructions (Signed)
 Medication Instructions:   Your physician recommends that you continue on your current medications as directed. Please refer to the Current Medication list given to you today.    *If you need a refill on your cardiac medications before your next appointment, please call your pharmacy*   Lab Work: NONE ORDERED  TODAY    If you have labs (blood work) drawn today and your tests are completely normal, you will receive your results only by: MyChart Message (if you have MyChart) OR A paper copy in the mail If you have any lab test that is abnormal or we need to change your treatment, we will call you to review the results.    Testing/Procedures: NONE ORDERED  TODAY      Follow-Up: At [facility], you and your health needs are our priority.  As part of our continuing mission to provide you with exceptional heart care, we have created designated Provider Care Teams.  These Care Teams include your primary Cardiologist (physician) and Advanced Practice Providers (APPs -  Physician Assistants and Nurse Practitioners) who all work together to provide you with the care you need, when you need it.  We recommend signing up for the patient portal called MyChart.  Sign up information is provided on this After Visit Summary.  MyChart is used to connect with patients for Virtual Visits (Telemedicine).  Patients are able to view lab/test results, encounter notes, upcoming appointments, etc.  Non-urgent messages can be sent to your provider as well.   To learn more about what you can do with MyChart, go to [email or web address].    Your next appointment:    3 month(s)  ( CONTACT  CASSIE HALL/ ANGELINE HAMMER FOR EP SCHEDULING ISSUES )    Provider:    Soyla Norton, MD or Charlies Arthur, PA-C    Other Instructions

## 2023-12-09 ENCOUNTER — Other Ambulatory Visit: Payer: Self-pay | Admitting: Cardiology

## 2023-12-09 DIAGNOSIS — I480 Paroxysmal atrial fibrillation: Secondary | ICD-10-CM

## 2023-12-09 DIAGNOSIS — E785 Hyperlipidemia, unspecified: Secondary | ICD-10-CM

## 2023-12-10 ENCOUNTER — Telehealth: Payer: Self-pay

## 2023-12-10 ENCOUNTER — Ambulatory Visit
Admission: RE | Admit: 2023-12-10 | Discharge: 2023-12-10 | Disposition: A | Discharge status: Home / Self Care | Payer: Medicare Other | Source: Ambulatory Visit | Attending: Internal Medicine | Admitting: Internal Medicine

## 2023-12-10 DIAGNOSIS — N281 Cyst of kidney, acquired: Secondary | ICD-10-CM

## 2023-12-10 NOTE — Telephone Encounter (Signed)
 Copied from CRM [id number]. Topic: Clinical - Medical Advice >> Dec 10, 2023 12:06 PM Adaysia C wrote: Reason for CRM: Patient called in to inform the provider that she will not be able to do the renal test today because she cannot tolerate the pain and she asked for Karpuih Moyun(CMA) to call her back as soon as possible to discuss the alternatives. Please follow up with patient [phone].

## 2023-12-10 NOTE — Telephone Encounter (Signed)
 Prescription refill request for Eliquis  received. Indication: PAF Last office visit: 12/05/23  Acie Holiday PA_C Scr: 0.83 on 11/01/23  Epic Age: 73 Weight: 45.2kg  Based on above findings Eliquis  5mg  twice daily is the appropriate dose.  Refill approved.

## 2023-12-11 ENCOUNTER — Other Ambulatory Visit: Payer: Medicare Other

## 2023-12-11 ENCOUNTER — Encounter: Payer: Self-pay | Admitting: Internal Medicine

## 2023-12-11 NOTE — Telephone Encounter (Signed)
Spoke to pt. Pt reports she is not in pain but states she cannot do the MRI without any helps. She states the process requires IV contrast and that is not her issues.   Pt states she is claustrophobic and needs something to help her. Pt states she has ativan Rx that she takes when on airplane. Pt would like advise from provider on what she should take and states she needs something that work for her as she has heart issues.    Pt updates she reschedule her appt to 12/27/2023.   Please advise.

## 2023-12-12 NOTE — Telephone Encounter (Signed)
Attempted to reach pt. Left a voicemail to call us back.

## 2023-12-12 NOTE — Telephone Encounter (Signed)
Spoke to pt yesterday. Please see yesterday's encounter.

## 2023-12-12 NOTE — Telephone Encounter (Signed)
Pt is returning karpuih call

## 2023-12-13 NOTE — Telephone Encounter (Signed)
Attempted to reach pt. Left a detail message that update info will be send to pt's mychart.

## 2023-12-14 MED ORDER — LORAZEPAM 0.5 MG PO TABS: 0.5000 mg | ORAL_TABLET | Freq: Every day | ORAL | 0 refills | Status: AC | PRN

## 2023-12-19 NOTE — Progress Notes (Signed)
Same result no hematuria  mri of kidney has been ordered

## 2023-12-20 NOTE — Telephone Encounter (Signed)
Rx sent on 2/14.

## 2023-12-26 ENCOUNTER — Ambulatory Visit: Payer: Medicare Other

## 2023-12-26 ENCOUNTER — Encounter: Payer: Self-pay | Admitting: Cardiology

## 2023-12-26 VITALS — BP 120/60 | HR 60 | Temp 98.1°F | Ht 64.5 in | Wt 100.0 lb

## 2023-12-26 DIAGNOSIS — Z1211 Encounter for screening for malignant neoplasm of colon: Secondary | ICD-10-CM

## 2023-12-26 DIAGNOSIS — Z0000 Encounter for general adult medical examination without abnormal findings: Secondary | ICD-10-CM

## 2023-12-26 NOTE — Progress Notes (Signed)
 Subjective:   [name] [name] is a 73 y.o. female who presents for Medicare Annual (Subsequent) preventive examination.  Visit Complete: In person  Patient Medicare AWV questionnaire was completed by the patient on 12/24/23; I have confirmed that all information answered by patient is correct and no changes since this date.  Cardiac Risk Factors include: advanced age (>74men, >51 women);dyslipidemia     Objective:    Today's Vitals   12/26/23 0836  BP: 120/60  Pulse: 60  Temp: 98.1 F (36.7 C)  TempSrc: Oral  SpO2: (!) 6%  Weight: 100 lb (45.4 kg)  Height: 5' 4.5" (1.638 m)   Body mass index is 16.9 kg/m.     12/26/2023    9:00 AM 09/03/2023    6:11 AM 12/06/2022    9:49 AM 05/16/2022    8:34 AM 12/05/2021    9:25 AM 09/05/2021    1:04 PM 06/24/2021   10:58 AM  Advanced Directives  Does Patient Have a Medical Advance Directive? Yes Yes Yes Yes Yes Yes No  Type of Estate agent of [place];Living will Healthcare Power of [place];Living will Healthcare Power of [place];Living will Healthcare Power of [place];Living will Healthcare Power of [place];Living will Healthcare Power of [place];Living will   Does patient want to make changes to medical advance directive? No - Patient declined No - Patient declined No - Patient declined No - Patient declined No - Patient declined    Copy of Healthcare Power of Attorney in Chart? Yes - validated most recent copy scanned in chart (See row information) No - copy requested Yes - validated most recent copy scanned in chart (See row information) Yes - validated most recent copy scanned in chart (See row information) No - copy requested No - copy requested     Current Medications (verified) Outpatient Encounter Medications as of 12/26/2023  Medication Sig   acetaminophen (TYLENOL) 500 MG tablet Take 325-500 mg by mouth as needed for moderate pain (pain score 4-6).   acetaminophen-codeine (TYLENOL #3) 300-30 MG tablet Take 1  tablet by mouth every 6 (six) hours as needed.   alendronate (FOSAMAX) 70 MG tablet TAKE 1 TABLET BY MOUTH WEEKLY  WITH A FULL GLASS OF WATER ON AN EMPTY STOMACH   apixaban (ELIQUIS) 5 MG TABS tablet TAKE 1 TABLET BY MOUTH TWICE  DAILY   Cholecalciferol (VITAMIN D3) 1000 units CAPS Take 1,000 Units by mouth daily.   Coenzyme Q10 (COQ10) 100 MG CAPS Take 100 mg by mouth daily with supper.   diltiazem (CARDIZEM) 30 MG tablet TAKE 1 TABLET EVERY 4 HOURS AS NEEDED FOR BREAKTHROUGH AFIB   diltiazem (TIAZAC) 300 MG 24 hr capsule TAKE 1 CAPSULE BY MOUTH DAILY   dofetilide (TIKOSYN) 250 MCG capsule Take 1 capsule (250 mcg total) by mouth 2 (two) times daily.   LORazepam (ATIVAN) 0.5 MG tablet Take 1 tablet (0.5 mg total) by mouth daily as needed for anxiety (when flying).   metoprolol tartrate (LOPRESSOR) 25 MG tablet TAKE 1 TABLET BY MOUTH IN THE  MORNING AND 2 TABLETS IN THE  EVENING   Multiple Vitamins-Minerals (PRESERVISION AREDS PO) Take 1 capsule by mouth in the morning and at bedtime.   potassium chloride (KLOR-CON) 10 MEQ tablet TAKE 2 TABLETS BY MOUTH DAILY   pravastatin (PRAVACHOL) 20 MG tablet TAKE 1 TABLET BY MOUTH IN THE  EVENING   Vitamin D-Vitamin K (D3 + K2 DOTS) 1000-90 UNIT-MCG TABS    No facility-administered encounter medications on file as  of 12/26/2023.    Allergies (verified) Latex, Other, Codeine phosphate, Demerol [meperidine], Protonix [pantoprazole], Sulfa antibiotics, and Tens therapy replace back pads   History: Past Medical History:  Diagnosis Date   Allergic rhinitis    Aortic insufficiency    CAT SCRATCH 09/10/2009   Qualifier: Diagnosis of  By: Fabian Sharp MD, Neta Mends    Clavicle fracture 12/2006   left   Diverticulosis    HTN (hypertension)    Osteopenia    Paroxysmal atrial fibrillation (HCC)    Pneumonia    PONV (postoperative nausea and vomiting)    Vision disturbance 03/13/2013   Negative neuro MRA MRI   Past Surgical History:  Procedure Laterality Date    ABDOMINAL HYSTERECTOMY     pt denies 04/15/14   APPENDECTOMY     ATRIAL FIBRILLATION ABLATION N/A 02/01/2021   Procedure: ATRIAL FIBRILLATION ABLATION;  Surgeon: Hillis Range, MD;  Location: [facility];  Service: Cardiovascular;  Laterality: N/A;   ATRIAL FIBRILLATION ABLATION N/A 09/03/2023   Procedure: ATRIAL FIBRILLATION ABLATION;  Surgeon: Regan Lemming, MD;  Location: [facility];  Service: Cardiovascular;  Laterality: N/A;   BRONCHIAL WASHINGS  09/05/2021   Procedure: BRONCHIAL WASHINGS;  Surgeon: Leslye Peer, MD;  Location: [facility] ENDOS[facility];  Service: Cardiopulmonary;;   LAPAROSCOPIC SALPINGO OOPHERECTOMY Right    TONSILLECTOMY AND ADENOIDECTOMY     VIDEO BRONCHOSCOPY N/A 09/05/2021   Procedure: VIDEO BRONCHOSCOPY WITHOUT FLUORO;  Surgeon: Leslye Peer, MD;  [facility] ENDOS[facility];  Service: Cardiopulmonary;  Laterality: N/A;   Family History  Problem Relation Age of Onset   Scleroderma Mother    Hyperlipidemia Mother    Coronary artery disease Father 81   Esophageal cancer Paternal Grandmother    Social History   Socioeconomic History   Marital status: Married    Spouse name: Not on file   Number of children: 0   Years of education: Not on file   Highest education level: Bachelor's degree (e.g., BA, AB, BS)  Occupational History   Occupation: Production manager   Occupation: REPORTER    Employer: [employer]  Tobacco Use   Smoking status: Never   Smokeless tobacco: Never   Tobacco comments:    Never smoked 06/28/23  Substance and Sexual Activity   Alcohol use: Not Currently   Drug use: No   Sexual activity: Not on file  Other Topics Concern   Not on file  Social History Narrative   Married   hh of 2    p0g0   Works for [employer]    Social Drivers of Corporate investment banker Strain: Low Risk  (12/26/2023)   Overall Financial Resource Strain (CARDIA)    Difficulty of Paying Living Expenses: Not hard at all  Food Insecurity:  No Food Insecurity (12/26/2023)   Hunger Vital Sign    Worried About Running Out of Food in the Last Year: Never true    Ran Out of Food in the Last Year: Never true  Transportation Needs: No Transportation Needs (12/26/2023)   PRAPARE - Administrator, Civil Service (Medical): No    Lack of Transportation (Non-Medical): No  Physical Activity: Sufficiently Active (12/26/2023)   Exercise Vital Sign    Days of Exercise per Week: 4 days    Minutes of Exercise per Session: 60 min  Recent Concern: Physical Activity - Insufficiently Active (12/26/2023)   Exercise Vital Sign    Days of Exercise per Week: 2 days  Minutes of Exercise per Session: 50 min  Stress: No Stress Concern Present (12/26/2023)   [employer] of Occupational Health - Occupational Stress Questionnaire    Feeling of Stress : Not at all  Social Connections: Socially Integrated (12/26/2023)   Social Connection and Isolation Panel [NHANES]    Frequency of Communication with Friends and Family: More than three times a week    Frequency of Social Gatherings with Friends and Family: More than three times a week    Attends Religious Services: More than 4 times per year    Active Member of [employer] or Organizations: Yes    Attends Engineer, structural: More than 4 times per year    Marital Status: Married    Tobacco Counseling Counseling given: Not Answered Tobacco comments: Never smoked 06/28/23   Clinical Intake:  Pre-visit preparation completed: Yes  Pain : No/denies pain     BMI - recorded: 16.9 Nutritional Status: BMI <19  Underweight Nutritional Risks: None Diabetes: No  How often do you need to have someone help you when you read instructions, pamphlets, or other written materials from your doctor or pharmacy?: 1 - Never  Interpreter Needed?: No  Information entered by :: Theresa Mulligan LPN   Activities of Daily Living    12/26/2023    8:58 AM 12/24/2023    4:45 PM  In your present  state of health, do you have any difficulty performing the following activities:  Hearing? 0 0  Vision? 0 0  Difficulty concentrating or making decisions? 0 0  Walking or climbing stairs? 0 0  Dressing or bathing? 0 0  Doing errands, shopping? 0 0  Preparing Food and eating ? N N  Using the Toilet? N N  In the past six months, have you accidently leaked urine? N N  Do you have problems with loss of bowel control? N N  Managing your Medications? N N  Managing your Finances? N N  Housekeeping or managing your Housekeeping? N N    Patient Care Team: [name], Neta Mends, MD as PCP - General (Internal Medicine) Rollene Rotunda, MD as PCP - Cardiology (Cardiology) Regan Lemming, MD as PCP - Electrophysiology (Cardiology) Ernesto Rutherford, MD (Ophthalmology) Rollene Rotunda, MD as Consulting Physician (Cardiology) [place], Milas Kocher, [facility] (Pharmacist)  Indicate any recent Medical Services you may have received from other than [facility] providers in the past year (date may be approximate).     Assessment:   This is a routine wellness examination for Anaiza.  Hearing/Vision screen Hearing Screening - Comments:: Denies hearing difficulties   Vision Screening - Comments:: Wears rx glasses - up to date with routine eye exams with  Dr Dione Booze   Goals Addressed               This Visit's Progress     Increase physical activity (pt-stated)         Depression Screen    12/26/2023    8:43 AM 12/06/2022    9:46 AM 07/10/2022    4:30 PM 06/19/2022   11:54 AM 06/15/2022   11:10 AM 06/07/2022    8:31 AM 03/28/2022    3:14 PM  PHQ 2/9 Scores  PHQ - 2 Score 0 0 0 0 0 0 0  PHQ- 9 Score   0 0 0 0 1    Fall Risk    12/26/2023    8:58 AM 12/24/2023    4:45 PM 08/03/2023    2:10 PM 12/06/2022  9:48 AM 12/02/2022    9:06 AM  Fall Risk   Falls in the past year? 1 1 0 0 0  Number falls in past yr: 0 0  0 0  Injury with Fall? 1 1  0 0  Comment 2 sm Rib Fx. Followed bu medical attention      Risk for  fall due to : No Fall Risks   No Fall Risks   Follow up Falls prevention discussed;Falls evaluation completed   Falls prevention discussed     MEDICARE RISK AT HOME: Medicare Risk at Home Any stairs in or around the home?: Yes If so, are there any without handrails?: No Home free of loose throw rugs in walkways, pet beds, electrical cords, etc?: Yes Adequate lighting in your home to reduce risk of falls?: Yes Life alert?: No Use of a cane, walker or w/c?: No Grab bars in the bathroom?: Yes Shower chair or bench in shower?: Yes Elevated toilet seat or a handicapped toilet?: No  TIMED UP AND GO:  Was the test performed?  Yes  Length of time to ambulate 10 feet: 10 sec Gait steady and fast without use of assistive device    Cognitive Function:        12/26/2023    9:00 AM 12/06/2022    9:49 AM 12/05/2021    9:25 AM  6CIT Screen  What Year? 0 points 0 points 0 points  What month? 0 points 0 points 0 points  What time? 0 points 0 points 0 points  Count back from 20 0 points 0 points 0 points  Months in reverse 0 points 0 points 0 points  Repeat phrase 0 points 0 points 0 points  Total Score 0 points 0 points 0 points    Immunizations Immunization History  Administered Date(s) Administered   Fluad Quad(high Dose 65+) 09/02/2019, 08/03/2021, 09/22/2022   Fluad Trivalent(High Dose 65+) 07/13/2023   Influenza Split 08/25/2011, 09/03/2013, 08/03/2014   Influenza Whole 08/25/2009, 07/30/2010, 07/30/2012   Influenza, High Dose Seasonal PF 07/30/2020   Influenza,inj,Quad PF,6+ Mos 08/17/2015, 07/18/2016, 07/30/2017   Influenza-Unspecified 08/20/2018, 08/09/2019   Moderna Covid-19 Vaccine Bivalent Booster 55yrs & up 09/13/2021   PFIZER(Purple Top)SARS-COV-2 Vaccination 12/05/2019, 12/30/2019, 08/17/2020, 02/25/2021   Pfizer(Comirnaty)Fall Seasonal Vaccine 12 years and older 10/26/2022, 08/01/2023   Pneumococcal Conjugate-13 03/05/2018   Pneumococcal Polysaccharide-23 02/02/2020    Pneumococcal-Unspecified 12/02/2019   Respiratory Syncytial Virus Vaccine,Recomb Aduvanted(Arexvy) 09/14/2022   Td 05/22/2007   Zoster Recombinant(Shingrix) 03/22/2018, 07/28/2018   Zoster, Live 10/02/2013    TDAP status: Due, Education has been provided regarding the importance of this vaccine. Advised may receive this vaccine at local pharmacy or Health Dept. Aware to provide a copy of the vaccination record if obtained from local pharmacy or Health Dept. Verbalized acceptance and understanding.  Flu Vaccine status: Up to date  Pneumococcal vaccine status: Up to date  Covid-19 vaccine status: Declined, Education has been provided regarding the importance of this vaccine but patient still declined. Advised may receive this vaccine at local pharmacy or Health Dept.or vaccine clinic. Aware to provide a copy of the vaccination record if obtained from local pharmacy or Health Dept. Verbalized acceptance and understanding.  Qualifies for Shingles Vaccine? Yes   Zostavax completed Yes   Shingrix Completed?: Yes  Screening Tests Health Maintenance  Topic Date Due   DTaP/Tdap/Td (2 - Tdap) 05/21/2017   COVID-19 Vaccine (8 - 2024-25 season) 09/26/2023   Colonoscopy  04/20/2024   Medicare Annual Wellness (AWV)  12/25/2024   MAMMOGRAM  08/15/2025   Pneumonia Vaccine 31+ Years old  Completed   INFLUENZA VACCINE  Completed   DEXA SCAN  Completed   Hepatitis C Screening  Completed   Zoster Vaccines- Shingrix  Completed   HPV VACCINES  Aged Out    Health Maintenance  Health Maintenance Due  Topic Date Due   DTaP/Tdap/Td (2 - Tdap) 05/21/2017   COVID-19 Vaccine (8 - 2024-25 season) 09/26/2023   Colonoscopy  04/20/2024    Colorectal cancer screening: Referral to GI placed 12/26/23. Pt aware the office will call re: appt.  Mammogram status: Completed 08/16/23. Repeat every year  Bone Density status: Completed 10/10/22. Results reflect: Bone density results: OSTEOPOROSIS. Repeat every    years.     Additional Screening:  Hepatitis C Screening: does qualify; Completed 09/14/16  Vision Screening: Recommended annual ophthalmology exams for early detection of glaucoma and other disorders of the eye. Is the patient up to date with their annual eye exam?  Yes  Who is the provider or what is the name of the office in which the patient attends annual eye exams? Dr Dione Booze If pt is not established with a provider, would they like to be referred to a provider to establish care? No .   Dental Screening: Recommended annual dental exams for proper oral hygiene    Community Resource Referral / Chronic Care Management:  CRR required this visit?  No   CCM required this visit?  No     Plan:     I have personally reviewed and noted the following in the patient's chart:   Medical and social history Use of alcohol, tobacco or illicit drugs  Current medications and supplements including opioid prescriptions. Patient is not currently taking opioid prescriptions. Functional ability and status Nutritional status Physical activity Advanced directives List of other physicians Hospitalizations, surgeries, and ER visits in previous 12 months Vitals Screenings to include cognitive, depression, and falls Referrals and appointments  In addition, I have reviewed and discussed with patient certain preventive protocols, quality metrics, and best practice recommendations. A written personalized care plan for preventive services as well as general preventive health recommendations were provided to patient.     Tillie Rung, LPN   1/91/4782   After Visit Summary: (In Person-Printed) AVS printed and given to the patient  Nurse Notes: None

## 2023-12-26 NOTE — Patient Instructions (Addendum)
 [name] [name] , Thank you for taking time to come for your Medicare Wellness Visit. I appreciate your ongoing commitment to your health goals. Please review the following plan we discussed and let me know if I can assist you in the future.   Referrals/Orders/Follow-Ups/Clinician Recommendations: Increase physical activity  This is a list of the screening recommended for you and due dates:  Health Maintenance  Topic Date Due   DTaP/Tdap/Td vaccine (2 - Tdap) 05/21/2017   COVID-19 Vaccine (8 - 2024-25 season) 09/26/2023   Colon Cancer Screening  04/20/2024   Medicare Annual Wellness Visit  12/25/2024   Mammogram  08/15/2025   Pneumonia Vaccine  Completed   Flu Shot  Completed   DEXA scan (bone density measurement)  Completed   Hepatitis C Screening  Completed   Zoster (Shingles) Vaccine  Completed   HPV Vaccine  Aged Out    Advanced directives: (In Chart) A copy of your advanced directives are scanned into your chart should your provider ever need it.  Next Medicare Annual Wellness Visit scheduled for next year: Yes

## 2023-12-27 ENCOUNTER — Ambulatory Visit
Admission: RE | Admit: 2023-12-27 | Discharge: 2023-12-27 | Disposition: A | Discharge status: Home / Self Care | Payer: Medicare Other | Source: Ambulatory Visit | Attending: Internal Medicine | Admitting: Internal Medicine

## 2023-12-27 DIAGNOSIS — N281 Cyst of kidney, acquired: Secondary | ICD-10-CM | POA: Diagnosis not present

## 2023-12-27 DIAGNOSIS — K838 Other specified diseases of biliary tract: Secondary | ICD-10-CM | POA: Diagnosis not present

## 2023-12-27 MED ORDER — GADOPICLENOL 0.5 MMOL/ML IV SOLN
5.0000 mL | Freq: Once | INTRAVENOUS | Status: AC | PRN
  Administered 2023-12-27: 5 mL via INTRAVENOUS

## 2024-01-02 ENCOUNTER — Ambulatory Visit (INDEPENDENT_AMBULATORY_CARE_PROVIDER_SITE_OTHER): Payer: Medicare Other

## 2024-01-02 DIAGNOSIS — I480 Paroxysmal atrial fibrillation: Secondary | ICD-10-CM

## 2024-01-02 NOTE — Progress Notes (Signed)
 01/02/2024 Name: [name] [name] MRN: [id number] DOB: 07/16/1951   [name] [name] is a 73 y.o. year old female who was referred for medication management by their primary care provider, [name], Neta Mends, MD. They presented for a face to face visit today.   They were referred to the pharmacist by their PCP for assistance in managing complex medication management    Subjective:  Care Team: Primary Care Provider: Madelin Headings, MD  Medication Access/Adherence  Current Pharmacy:  [facility]) - [place], [place] - [address] [facility] [address] [place] [address] [place] [address] Phone: [phone] Fax: [phone]  [facility] Delivery - [place], [place] - [address] [place] [address] Phone: [phone] Fax: [phone]  [id number] - [place], [place] - [address] AT [facility] OF [address] [place] [address] Phone: [phone] Fax: [phone]  [id number] [place], [place] - [address] [place] [address] Phone: [phone] Fax: [phone]   Patient reports affordability concerns with their medications: No  Patient reports access/transportation concerns to their pharmacy: No  Patient reports adherence concerns with their medications:  No     Atrial Fibrillation:  Current medications: Rate Control: Metoprolol tartrate 50mg  1 tab qam, 2 tabs qpm Rhythm Control: Dofetilide 1 BID, Diltiazem 300mg  1 every day, Diltiazem 30mg  q4h prn breakthrough afib Anticoagulation Regimen: Eliquis 5mg  BID  Medications tried in the past: Flecainide, Propranolol  CHADS2VASC: 3  Current blood pressure/heart rate readings: Brings in an extensive log where she has made note of her readings and afib flares. [place] with an app on her phone called [name]  Has noticed a decrease in a fib episodes since  ablation  Patient denies hypotensive s/sx including dizziness, lightheadedness. Patient denies hypertensive symptoms including headache, chest pain, shortness of breath   Current medication access support: Tikosyn on GoodRx  Reports no a fib episodes since ablation procedure last month   Osteoporosis:  Current medications: Alendronate 70mg  once weekly Medications tried in the past: None  Current supplements: Vitamin D 2000 units daily, Calcium - from her diet    Most recent DEXA: 10/10/22, bone health did improve but still at -2.5 for LFN  Current medication access support: None   Objective:   Lab Results  Component Value Date   CREATININE 0.83 11/01/2023   BUN 29 (H) 11/01/2023   NA 140 11/01/2023   K 3.9 11/01/2023   CL 104 11/01/2023   CO2 29 11/01/2023    Lab Results  Component Value Date   CHOL 136 06/27/2022   HDL 72 06/27/2022   LDLCALC 51 06/27/2022   LDLDIRECT 131.0 09/03/2009   TRIG 64 06/27/2022   CHOLHDL 1.9 06/27/2022    Medications Reviewed Today     Reviewed by Sherrill Raring, [name] (Pharmacist) on 01/02/24 at 1132  Med List Status: <None>   Medication Order Taking? Sig Documenting Provider Last Dose Status Informant  acetaminophen (TYLENOL) 500 MG tablet [id number] No Take 325-500 mg by mouth as needed for moderate pain (pain score 4-6). [provider] Taking Active Self           Med Note Barnie Mort Oct 08, 2023 10:17 AM) Per pt, "been a few months since last dose"  acetaminophen-codeine (TYLENOL #3) 300-30 MG tablet [id number]  Take 1 tablet by mouth every 6 (six)  hours as needed. [provider]  Active   alendronate (FOSAMAX) 70 MG tablet [id number] No TAKE 1 TABLET BY MOUTH WEEKLY  WITH A FULL GLASS OF WATER ON AN EMPTY STOMACH Carlus Pavlov, MD Taking Active Self           Med Note Lenoria Farrier   Fri Aug 24, 2023  2:02 PM) Friday  apixaban (ELIQUIS) 5 MG TABS tablet [id number]  TAKE 1 TABLET BY  MOUTH TWICE  DAILY Rollene Rotunda, MD  Active   Cholecalciferol (VITAMIN D3) 1000 units CAPS [id number] No Take 1,000 Units by mouth daily. [provider] Taking Active Self  Coenzyme Q10 (COQ10) 100 MG CAPS [id number] No Take 100 mg by mouth daily with supper. [provider] Taking Active Self  diltiazem (CARDIZEM) 30 MG tablet [id number] No TAKE 1 TABLET EVERY 4 HOURS AS NEEDED FOR BREAKTHROUGH AFIB [name], [name], PA Taking Active Self           Med Note Sherrill Raring   Mon Oct 08, 2023 10:18 AM) Has not needed to take since prior to ablation  diltiazem (TIAZAC) 300 MG 24 hr capsule [id number] No TAKE 1 CAPSULE BY MOUTH DAILY Rollene Rotunda, MD Taking Active Self  dofetilide (TIKOSYN) 250 MCG capsule [id number] No Take 1 capsule (250 mcg total) by mouth 2 (two) times daily. Regan Lemming, MD Taking Active Self  LORazepam (ATIVAN) 0.5 MG tablet [id number]  Take 1 tablet (0.5 mg total) by mouth daily as needed for anxiety (when flying). [name], Neta Mends, MD  Active   metoprolol tartrate (LOPRESSOR) 25 MG tablet [id number] No TAKE 1 TABLET BY MOUTH IN THE  MORNING AND 2 TABLETS IN THE  Charyl Dancer, PA-C Taking Active Self  Multiple Vitamins-Minerals (PRESERVISION AREDS PO) [id number] No Take 1 capsule by mouth in the morning and at bedtime. [provider] Taking Active Self  potassium chloride (KLOR-CON) 10 MEQ tablet [id number] No TAKE 2 TABLETS BY MOUTH DAILY Hochrein, Fayrene Fearing, MD Taking Active Self  pravastatin (PRAVACHOL) 20 MG tablet [id number]  TAKE 1 TABLET BY MOUTH IN THE  Shea Stakes, MD  Active   Vitamin D-Vitamin K (D3 + K2 DOTS) 1000-90 UNIT-MCG TABS [id number]   [provider]  Active               Assessment/Plan:   Atrial Fibrillation: - Currently controlled - Reviewed importance of adherence to anticoagulant for stroke prevention. - Reviewed appropriate blood pressure monitoring technique and reviewed goal  blood pressure. Recommended to check home blood pressure and heart rate daily -Continue current medication therapy   Osteoporosis: - Currently appropriately managed - Reviewed recommendation for daily calcium intake of 1200 mg from diet and vitamin D intake of [id number] units - Reviewed benefits of weight bearing exercise -Continue medication therapy per endo, reevaluate alendronate drug holiday in 1 year at their sch appt   Follow Up Plan: 6 months  Sherrill Raring, PharmD Clinical Pharmacist [phone]

## 2024-01-08 ENCOUNTER — Encounter: Payer: Self-pay | Admitting: Internal Medicine

## 2024-01-08 NOTE — Progress Notes (Signed)
 Renal cyst  appears benign  that is reassuring .  At this time  the only possible  abnormality is a borderline  Of gall bladder duct .which may be normal . If liver tests are normal then I see no reason to do more testing , I suggest you make an in person appt for  cpe within the next   3-4 months  and we can do any fu indicated   Hoep y ou are doing well after all of this testing.[name]

## 2024-01-08 NOTE — Telephone Encounter (Signed)
 Contacted Radiology Read Room. They states they are behind and will note that pt ask about the result so some one can read it sooner.   Attempted to reach pt. Left a voicemail to call us back.

## 2024-01-17 ENCOUNTER — Ambulatory Visit: Admitting: Cardiology

## 2024-01-17 NOTE — Progress Notes (Deleted)
 Cardiology Office Note:   Date:  01/17/2024  ID:  [name] [name], DOB 1951/08/19, MRN [id number] PCP: Madelin Headings, MD  [facility] Providers Cardiologist:  Rollene Rotunda, MD Electrophysiologist:  Will [name], MD {  History of Present Illness:   [name] [name] is a 73 y.o. female  who for follow up of palpitations and aortic insufficiency.  She has paroxysmal atrial fibrillation. She is status post ablation.   She had Covid and she had increased fib post this.  She was started on Tikosyn.   Her most recent echo in June demonstrated an EF of 60 to 65%.  The aortic insufficiency was moderate.  Left ventricular size and function was normal.  She is now status post A-fib ablation since I saw her.  She did have multiple atypical atrial flutter circuits I noticed.  She continues to remain on Tikosyn because of this.    ****An echo in June 2023 demonstrated a well preserved EF and moderate to severe AI unchanged from the previous echo. Ventricular size and function was normal.      She called recently with palpitations.  She has had continued paroxysms maybe not lasting quite as long as previously.  Is 4 to 5 hours.  She takes the Cardizem if her blood pressure is greater than 100 and heart rates greater than 100.  She does not feel well with it and that she has a little band of chest tightness but she is not having any presyncope or syncope.  She is not having any new shortness of breath.  The discomfort goes away if she loosens her bra.  She is staying active.  She walks for exercise.  She had a repeat echocardiogram done recently demonstrated well-preserved ejection fraction still.  Normal ventricular sizes.  The Avva still moderate.  ROS: ***  Studies Reviewed:    EKG:       ***  Risk Assessment/Calculations:    CHA2DS2-VASc Score = 3   This indicates a 3.2% annual risk of stroke. The patient's score is based upon: CHF History: 0 HTN History: 1 Diabetes History: 0 Stroke  History: 0 Vascular Disease History: 0 Age Score: 1 Gender Score: 1   Physical Exam:   VS:  There were no vitals taken for this visit.   Wt Readings from Last 3 Encounters:  12/26/23 100 lb (45.4 kg)  12/05/23 99 lb 9.6 oz (45.2 kg)  10/02/23 97 lb 12.8 oz (44.4 kg)     GEN: Well nourished, well developed in no acute distress NECK: No JVD; No carotid bruits CARDIAC: ***RR, *** murmurs, rubs, gallops RESPIRATORY:  Clear to auscultation without rales, wheezing or rhonchi  ABDOMEN: Soft, non-tender, non-distended EXTREMITIES:  No edema; No deformity   ASSESSMENT AND PLAN:   Aortic insufficiency -  This was moderate on echo in June of last year.  I will follow-up with an echo in June 2025.   *** Is as above.  I will follow this clinically with an echo next year.    Atrial fib-  This was managed with ablation and currently managed with Tikosyn and anticoagulation as above.    ***  [name] [name] has a CHA2DS2 - VASc score of 2.  She is going back to see Dr.Camnitz.  She is going to discuss possibly another ablation.   HTN - The blood pressure is *** running low.  No change in therapy.    Elevated coronary calcium - She does have an  elevated coronary calcium score noted on the CT.  This was 231 which was 86 percentile.  She had a negative stress test in 2018.       Elevated lipid - ***  She had an excellent response to the low-dose of pravastatin.  LDL was 51 last year.  She can get this checked by her primary provider again this year.    Aortic atherosclerosis - This is being managed with primary risk reduction.    Ascending aortic dilatation - Her aorta was 42 mm on contrasted CT in October 2024.  This has been stable.  I will likely follow this with a contrasted CT again in 2026.  ***  This is 43 mm in October 2023 and I will follow-up with a contrasted CT of her aorta in October.       Follow up ***  Signed, Rollene Rotunda, MD

## 2024-02-04 DIAGNOSIS — L218 Other seborrheic dermatitis: Secondary | ICD-10-CM | POA: Diagnosis not present

## 2024-02-04 DIAGNOSIS — Z08 Encounter for follow-up examination after completed treatment for malignant neoplasm: Secondary | ICD-10-CM | POA: Diagnosis not present

## 2024-02-04 DIAGNOSIS — L814 Other melanin hyperpigmentation: Secondary | ICD-10-CM | POA: Diagnosis not present

## 2024-02-04 DIAGNOSIS — L821 Other seborrheic keratosis: Secondary | ICD-10-CM | POA: Diagnosis not present

## 2024-02-04 DIAGNOSIS — Z85828 Personal history of other malignant neoplasm of skin: Secondary | ICD-10-CM | POA: Diagnosis not present

## 2024-02-04 DIAGNOSIS — D225 Melanocytic nevi of trunk: Secondary | ICD-10-CM | POA: Diagnosis not present

## 2024-02-15 ENCOUNTER — Telehealth (HOSPITAL_COMMUNITY): Payer: Self-pay

## 2024-02-15 ENCOUNTER — Telehealth: Payer: Self-pay | Admitting: Physician Assistant

## 2024-02-15 ENCOUNTER — Ambulatory Visit: Attending: Cardiology | Admitting: Cardiology

## 2024-02-15 VITALS — BP 118/90 | HR 62 | Resp 16 | Ht 64.0 in | Wt 100.2 lb

## 2024-02-15 DIAGNOSIS — R931 Abnormal findings on diagnostic imaging of heart and coronary circulation: Secondary | ICD-10-CM | POA: Diagnosis not present

## 2024-02-15 DIAGNOSIS — I77810 Thoracic aortic ectasia: Secondary | ICD-10-CM

## 2024-02-15 DIAGNOSIS — Z79899 Other long term (current) drug therapy: Secondary | ICD-10-CM | POA: Diagnosis not present

## 2024-02-15 DIAGNOSIS — I351 Nonrheumatic aortic (valve) insufficiency: Secondary | ICD-10-CM

## 2024-02-15 DIAGNOSIS — D6869 Other thrombophilia: Secondary | ICD-10-CM

## 2024-02-15 DIAGNOSIS — I4819 Other persistent atrial fibrillation: Secondary | ICD-10-CM | POA: Diagnosis not present

## 2024-02-15 DIAGNOSIS — I10 Essential (primary) hypertension: Secondary | ICD-10-CM

## 2024-02-15 DIAGNOSIS — R072 Precordial pain: Secondary | ICD-10-CM

## 2024-02-15 DIAGNOSIS — E782 Mixed hyperlipidemia: Secondary | ICD-10-CM

## 2024-02-15 MED ORDER — METOPROLOL TARTRATE 25 MG PO TABS
ORAL_TABLET | ORAL | Status: DC
Start: 2024-02-15 — End: 2024-02-20

## 2024-02-15 NOTE — Progress Notes (Unsigned)
 Cardiology Office Note:   Date:  02/15/2024  ID:  [name] [name], DOB 1951-01-14, MRN [id number] PCP: Reginal Capra, MD  [facility] Providers Cardiologist:  Eilleen Grates, MD Electrophysiologist:  Will Cortland Ding, MD {  History of Present Illness:   [name] [name] is a 73 y.o. female  who for follow up of palpitations and aortic insufficiency.  She has paroxysmal atrial fibrillation. She is status post ablation.   She had Covid and she had increased atrial fibrillation this the event. She was started on Tikosyn .   Her most recent echo in June demonstrated an EF of 60 to 65%.  The aortic insufficiency was moderate.  Left ventricular size and function was normal.  She is now status post A-fib ablation since I saw her.    She continues to remain on Tikosyn  and OAC.   I am seeing her today on my DOD day.   Better since her second afib ablation  Thinks she has more Afib  7hr 02/12/24 13 02/14/2024 - 02/15/2024 this morning  Kardia mobiel  <103bpm Afib episode and BP 112/73   ROS:  ROS   Studies Reviewed:    EKG:  EKG Interpretation Date/Time:  Friday February 15 2024 16:26:49 EDT Ventricular Rate:  61 PR Interval:  182 QRS Duration:  88 QT Interval:  436 QTC Calculation: 438 R Axis:   64  Text Interpretation: Normal sinus rhythm Possible Left atrial enlargement When compared with ECG of 05-Dec-2023 09:15, No significant change was found Confirmed by Olinda Bertrand [phone]) on 02/15/2024 5:06:26 PM  Echo:  03/2023  1. Left ventricular ejection fraction, by estimation, is 60 to 65%. The  left ventricle has normal function. The left ventricle has no regional  wall motion abnormalities. Left ventricular diastolic parameters are  indeterminate.   2. Right ventricular systolic function is normal. The right ventricular  size is normal.   3. Left atrial size was mildly dilated.   4. The mitral valve is normal in structure. Trivial mitral valve  regurgitation.   5. The aortic  valve is tricuspid. Aortic valve regurgitation is moderate.  Aortic valve sclerosis is present, with no evidence of aortic valve  stenosis.   6. There is mild dilatation of the ascending aorta, measuring 42 mm.   7. The inferior vena cava is normal in size with greater than 50%  respiratory variability, suggesting right atrial pressure of 3 mmHg.   Risk Assessment/Calculations:    CHA2DS2-VASc Score = 3   This indicates a 3.2% annual risk of stroke. The patient's score is based upon: CHF History: 0 HTN History: 1 Diabetes History: 0 Stroke History: 0 Vascular Disease History: 0 Age Score: 1 Gender Score: 1   Physical Exam:   VS:  BP (!) 118/90 (BP Location: Left Arm, Patient Position: Sitting, Cuff Size: Normal)   Pulse 62   Resp 16   Ht 5\' 4"  (1.626 m)   Wt 100 lb 3.2 oz (45.5 kg)   SpO2 96%   BMI 17.20 kg/m    Wt Readings from Last 3 Encounters:  02/15/24 100 lb 3.2 oz (45.5 kg)  12/26/23 100 lb (45.4 kg)  12/05/23 99 lb 9.6 oz (45.2 kg)     GEN: Well nourished, well developed in no acute distress NECK: No JVD; No carotid bruits CARDIAC: ***RR, *** murmurs, rubs, gallops RESPIRATORY:  Clear to auscultation without rales, wheezing or rhonchi  ABDOMEN: Soft, non-tender, non-distended EXTREMITIES:  No edema; No deformity   ASSESSMENT  AND PLAN:   Aortic insufficiency  Dilated Ascending aorta  This was moderate on echo in June of last year.  I will follow-up with an echo in June 2026.      Atrial fib-  This was managed with ablation and currently managed with Tikosyn  and anticoagulation as above.  ***   HTN - The blood pressure is *** running low.  No change in therapy.    Elevated coronary calcium  - She does have an elevated coronary calcium  score noted on the CT.  This was 231 which was 86 percentile.  She had a negative stress test in 2018.       Elevated lipid - She had an excellent response to the low-dose of pravastatin .  LDL was 51 last year.  She can  get this checked by her primary provider again this year.    Aortic atherosclerosis - This is being managed with primary risk reduction.  Continue statin therapy    Ascending aortic dilatation - Her aorta was 42 mm on contrasted CT in October 2024.  This has been stable.   I will likely follow this with a contrasted CT again in 2026.         Follow up ***  Signed, Latima Hamza, DO

## 2024-02-15 NOTE — Telephone Encounter (Signed)
 Spoke to patient and she reports that she has been feeling increase in A. Fib episodes this week. Pt reports that she is currently out of A. Fib she thinks, but has noticed some chest discomfort that has occurred occasionally through the night. Pt is increasingly concerned and would like an appointment today if possible. Pt has an appointment with A. Fib clinic 02/18/24, but is concerned about going through the weekend with chest discomfort. Appt made for DOD today.

## 2024-02-15 NOTE — Telephone Encounter (Signed)
 Patient called to let us  know she has had 2 episodes of A-fib this week. She has had some tightness in her chest along with squeezing sensation while in A-fib. However, no chest pain. Advised patient if she starts feeling light headed, having dizziness and chest pain along with shortness of breath she should go to the ER. Consulted with patient and she verbalized understanding. She is scheduled to be seen on 4/23 @ 8:30 am for appointment at the [facility]. Consulted with patient and she verbalized understanding.

## 2024-02-15 NOTE — Patient Instructions (Signed)
 Medication Instructions:  Your physician has recommended you make the following change in your medication:   INCREASE your Metoprolol  Tartrate (Lopressor ) to 50 mg twice daily (total of 100 mg per day)  HOLD if systolic blood pressure (top number) is less than 100 and/ or heart rate is less than 55.   *If you need a refill on your cardiac medications before your next appointment, please call your pharmacy*  Lab Work: None ordered today. If you have labs (blood work) drawn today and your tests are completely normal, you will receive your results only by: MyChart Message (if you have MyChart) OR A paper copy in the mail If you have any lab test that is abnormal or we need to change your treatment, we will call you to review the results.  Testing/Procedures: A message has been sent to our echo scheduler to try to move your echo to May.  Follow-Up: At [facility], you and your health needs are our priority.  As part of our continuing mission to provide you with exceptional heart care, we have created designated Provider Care Teams.  These Care Teams include your primary Cardiologist (physician) and Advanced Practice Providers (APPs -  Physician Assistants and Nurse Practitioners) who all work together to provide you with the care you need, when you need it.   Your next appointment:   Afib clinic 02/20/24  The format for your next appointment:   In Person  Other Instructions    1st Floor: - Lobby - Registration  - Pharmacy  - Lab - Cafe  2nd Floor: - PV Lab - Diagnostic Testing (echo, CT, nuclear med)  3rd Floor: - Vacant  4th Floor: - TCTS (cardiothoracic surgery) - AFib Clinic - Structural Heart Clinic - Vascular Surgery  - Vascular Ultrasound  5th Floor: - HeartCare Cardiology (general and EP) - Clinical Pharmacy for coumadin, hypertension, lipid, weight-loss medications, and med management appointments    Valet parking services will be available as well.

## 2024-02-15 NOTE — Telephone Encounter (Signed)
 Patient c/o Palpitations:  STAT if patient reporting lightheadedness, shortness of breath, or chest pain  How long have you had palpitations/irregular HR/ Afib? Are you having the symptoms now? No; this morning  Are you currently experiencing lightheadedness, SOB or CP? CP  Do you have a history of afib (atrial fibrillation) or irregular heart rhythm? Yes   Have you checked your BP or HR? (document readings if available): no   Are you experiencing any other symptoms? No

## 2024-02-16 ENCOUNTER — Encounter: Payer: Self-pay | Admitting: Cardiology

## 2024-02-16 NOTE — Progress Notes (Incomplete)
 Cardiology Office Note:    ID:  [name] [name], [name] [name] 19-Aug-1951, MRN [id number] PCP: Reginal Capra, MD  [facility] Providers Cardiologist:  Eilleen Grates, MD Electrophysiologist:  Will Cortland Ding, MD {  History of Present Illness:   [name] [name] is a 73 y.o. female  who for follow up of palpitations and aortic insufficiency.  She has persistent atrial fibrillation. She is status post ablation.   She had Covid and she had increased atrial fibrillation this the event. She was started on Tikosyn .   Her most recent echo in June demonstrated an EF of 60 to 65%.  The aortic insufficiency was moderate.  Left ventricular size and function was normal.  She is now status post A-fib ablation since I saw her.    She continues to remain on Tikosyn  and OAC.   I am seeing her today on my DOD day.   History of Present Illness   She has been experiencing recurrent episodes of atrial fibrillation following her second cardiac ablation on November 4th. Initially, she had fewer episodes, but this week there has been a significant increase. On Tuesday into Wednesday morning, she had an episode lasting over ten hours, followed by another from Thursday into Friday morning lasting approximately thirteen to fourteen hours. Previously, her episodes were spaced about 48 hours apart, but the recent episodes have not followed this pattern.  She uses a Kardia mobile device on her phone to monitor her condition, but it has been malfunctioning, providing unreadable readings. She has been using a blood pressure cuff instead. During the recent episodes, she experienced pain on the left side around her breast area and back. The discomfort is non effort related and not resolved with resting.  Resolves after she is back in normal rhythm. When she is in AFib her HR ranges between 100 to 109bpm. Blood pressure readings during these episodes were 112/73 mmHg with a heart rate of 100 bpm, returning to 115/68 mmHg with a heart  rate of 68 bpm after the episodes, indicating a return to normal sinus rhythm.  She is currently taking Tikosyn , diltiazem  300 mg daily, and metoprolol  25 mg in the morning and 50 mg at night. She also takes diltiazem  30 mg as needed when her blood pressure and heart rate are both over 100, which she used once last night with no effect. She is also on Eliquis  for anticoagulation.   She has an upcoming appointment with the AFib clinic next Wednesday and has been monitoring her condition closely, especially with the holiday weekend approaching.      ROS:  Review of Systems  Cardiovascular:  Positive for chest pain (see HPI) and palpitations (while in Afib). Negative for claudication, irregular heartbeat, leg swelling, near-syncope, orthopnea, paroxysmal nocturnal dyspnea and syncope.  Respiratory:  Negative for shortness of breath.   Hematologic/Lymphatic: Negative for bleeding problem.   Studies Reviewed:    EKG:  EKG Interpretation Date/Time:  Friday February 15 2024 16:26:49 EDT Ventricular Rate:  61 PR Interval:  182 QRS Duration:  88 QT Interval:  436 QTC Calculation: 438 R Axis:   64  Text Interpretation: Normal sinus rhythm Possible Left atrial enlargement When compared with ECG of 05-Dec-2023 09:15, No significant change was found Confirmed by Olinda Bertrand [phone]) on 02/15/2024 5:06:26 PM  Echo:  03/2023  1. Left ventricular ejection fraction, by estimation, is 60 to 65%. The  left ventricle has normal function. The left ventricle has no regional  wall motion abnormalities.  Left ventricular diastolic parameters are  indeterminate.   2. Right ventricular systolic function is normal. The right ventricular  size is normal.   3. Left atrial size was mildly dilated.   4. The mitral valve is normal in structure. Trivial mitral valve  regurgitation.   5. The aortic valve is tricuspid. Aortic valve regurgitation is moderate.  Aortic valve sclerosis is present, with no evidence of  aortic valve  stenosis.   6. There is mild dilatation of the ascending aorta, measuring 42 mm.   7. The inferior vena cava is normal in size with greater than 50%  respiratory variability, suggesting right atrial pressure of 3 mmHg.   Risk Assessment/Calculations:    CHA2DS2-VASc Score = 3   This indicates a 3.2% annual risk of stroke. The patient's score is based upon: CHF History: 0 HTN History: 1 Diabetes History: 0 Stroke History: 0 Vascular Disease History: 0 Age Score: 1 Gender Score: 1   Physical Exam:   VS:  BP (!) 118/90 (BP Location: Left Arm, Patient Position: Sitting, Cuff Size: Normal)   Pulse 62   Resp 16   Ht 5\' 4"  (1.626 m)   Wt 100 lb 3.2 oz (45.5 kg)   SpO2 96%   BMI 17.20 kg/m    Wt Readings from Last 3 Encounters:  02/15/24 100 lb 3.2 oz (45.5 kg)  12/26/23 100 lb (45.4 kg)  12/05/23 99 lb 9.6 oz (45.2 kg)     GEN: Well nourished, well developed in no acute distress NECK: No JVD; No carotid bruits CARDIAC: Regular rate and rhythm, soft diastolic murmur heard at the right sternal border, no gallops or rubs  RESPIRATORY:  Clear to auscultation without rales, wheezing or rhonchi  ABDOMEN: Soft, non-tender, non-distended EXTREMITIES:  No edema; No deformity   ASSESSMENT AND PLAN:      ICD-10-CM   1. Persistent atrial fibrillation (HCC)  I48.19 EKG 12-Lead    2. Secondary hypercoagulable state (HCC)  D68.69     3. High risk medication use  Z79.899     4. Long term current use of antiarrhythmic drug  Z79.899     5. Precordial pain  R07.2     6. Nonrheumatic aortic valve insufficiency  I35.1     7. Ascending aorta dilatation (HCC)  I77.810     8. Essential hypertension  I10     9. Elevated coronary artery calcium  score  R93.1     10. Mixed hyperlipidemia  E78.2       Assessment and Plan Persistent atrial fibrillation (HCC) Secondary hypercoagulable state (HCC) High risk medication use Long term current use of antiarrhythmic  drug Persistent, not longstanding Recurrent AFib episodes with increased frequency post-second ablation. Her heart rate ranges between 100-109 bpm during her AFib episodes. Stable blood pressure.  - In the past going up on metoprolol  did help her symptoms.  - Currently takes Lopressor  50mg  po qPM and 25mg  po qPM.  - Recommend that she takes Lopressor  25mg  TWO tabs bid with holding parameters (hold if SBP<11mmHG or HR <55bpm). IF she tolerates the higher dose of Lopressor  than when she goes to the Afib Clinic next week they can give her a new script which would be Lopressor  50mg  po bid (this will reduce pill burden) and ensure safety. She is asked to check her BP and pulse prior to meds to make sure she meets the parameters. - Continue diltiazem  300 mg daily; and continue to use short acting diltiazem  30 mg PRN if  BP and HR >100. - Continue Tikosyn  as prescribed. - Monitor BP regularly, especially with metoprolol  adjustment. - Attend AFib clinic on Wednesday for evaluation and potential metoprolol  adjustment. - Will try to move up her echo appointment to assess heart function.  Patient does not endorse evidence of bleeding.  Risks, benefits, alternatives anticoagulation discussed.  Patient is aware that she is on Tikosyn  which is high risk medication which will require often EKG checks as well as lab work to ensure potassium levels, renal functions are within acceptable limits  Precordial pain Predominantly noncardiac Intermittent left-sided chest pain, likely musculoskeletal. Associated with her AFib episodes. EKG negative for myocardial infarction. Pain resolved. - Monitor for changes in pain characteristics or severity. - For this reason will move her current echo appt sooner if possible to re-evaluate cardiac function.  -As long as the LVEF is preserved and no new regional wall motion normalities no additional workup is warranted.  Nonrheumatic aortic valve insufficiency Chronic Being  followed with serial echocardiograms. Scheduled for echocardiogram in June 2026, given her precordial pain we will try to expedite it.  Ascending aorta dilatation (HCC) Her aorta was 42 mm on contrasted CT in October 2024.  This has been stable.   There is an order in place, expected to be performed on July 30, 2024.    Elevated coronary calcium  - She does have an elevated coronary calcium  score noted on the CT.  This was 231 which was 86 percentile.  She had a negative stress test in 2018.       Hyperlipidemia, mixed She had an excellent response to the low-dose of pravastatin .  LDL was 51 last year.   Cardiology following peripherally, managed by primary care provider.  As mentioned above, patient follows Dr. Lavonne Prairie longitudinally for cardiovascular care.  I will CC a copy to him for reference.  Signed, Olinda Bertrand, DO, [facility]  [facility]  [address] [place], [place] [address]

## 2024-02-17 ENCOUNTER — Encounter: Payer: Self-pay | Admitting: Cardiology

## 2024-02-20 ENCOUNTER — Ambulatory Visit (HOSPITAL_COMMUNITY)
Admission: RE | Admit: 2024-02-20 | Discharge: 2024-02-20 | Disposition: A | Discharge status: Home / Self Care | Source: Ambulatory Visit | Attending: Physician Assistant | Admitting: Physician Assistant

## 2024-02-20 VITALS — BP 122/62 | HR 61 | Ht 64.0 in | Wt 100.4 lb

## 2024-02-20 DIAGNOSIS — Z5181 Encounter for therapeutic drug level monitoring: Secondary | ICD-10-CM | POA: Diagnosis not present

## 2024-02-20 DIAGNOSIS — D6869 Other thrombophilia: Secondary | ICD-10-CM | POA: Diagnosis not present

## 2024-02-20 DIAGNOSIS — I10 Essential (primary) hypertension: Secondary | ICD-10-CM | POA: Insufficient documentation

## 2024-02-20 DIAGNOSIS — I2510 Atherosclerotic heart disease of native coronary artery without angina pectoris: Secondary | ICD-10-CM | POA: Insufficient documentation

## 2024-02-20 DIAGNOSIS — Z79899 Other long term (current) drug therapy: Secondary | ICD-10-CM | POA: Insufficient documentation

## 2024-02-20 DIAGNOSIS — Z7901 Long term (current) use of anticoagulants: Secondary | ICD-10-CM | POA: Diagnosis not present

## 2024-02-20 DIAGNOSIS — I480 Paroxysmal atrial fibrillation: Secondary | ICD-10-CM | POA: Diagnosis not present

## 2024-02-20 MED ORDER — METOPROLOL TARTRATE 50 MG PO TABS: 50.0000 mg | ORAL_TABLET | Freq: Two times a day (BID) | ORAL | 2 refills | Status: DC

## 2024-02-20 NOTE — Progress Notes (Signed)
 Primary Care Physician: Reginal Capra, MD Referring Physician: Dr. Lavonne Prairie EP: Dr. [name] is a 73 y.o. female with a h/o HTN, paroxysmal afib, initially diagnosed with afib for several years. She is on rate control/flecainde  meds as well as eliquis  5 mg for a chadsvasc score  of 3. She is in the afib clinic to f/u ablation one month ago. She did have some episodes the second week of ablation but sine 18th of April she has not had anymore episodes. She did have an allergic reaction on her torso, probaly form the pads that were placed on chest for cardioversion. It has resolved. No swallowing or groin issues. She is back to her usual activities.    Follow up in the AF clinic 05/31/21. Patient presents for dofetilide  admission. She reports that she has had afib about every other day. She has stopped flecainide  3 days ago. She denies any missed doses of anticoagulation.   Follow up in the AF clinic 05/31/21. Patient is s/p dofetilide  admission 8/2-06/03/21. Patient reports she has not had any afib since starting the medication which is an improvement for her. She denies any bleeding issues on anticoagulation.   Follow up in the AF clinic 06/28/23. Patient reports that she has been having more frequent afib episodes, sometimes multiple episodes per day. There does not appear to be a specific trigger for these episodes. She keeps a log with her Kardia mobile device. She has symptoms of palpitations and fatigue.  Follow up in the AF clinic 10/01/23. Patient is s/p repeat afib ablation on 09/03/23 with Dr Lawana Pray. She reports that she has done well since the ablation with no episodes detected on her Kardia mobile. She denies chest pain or groin issues.   Follow up 02/20/24. Patient returns for follow up for atrial fibrillation and dofetilide  monitoring. She was seen by Dr Albert Huff as a DOD add on 4/18 for increased afib episodes. She had two episodes back to back, one lasting 13 hours. Her BB was  increased. Since then, she has not had any further afib episodes. She feels slightly more fatigued on the higher dose but this is tolerable for her. There were no specific triggers for her afib. No bleeding issues on anticoagulation.   Today, she  denies symptoms of palpitations, chest pain, shortness of breath, orthopnea, PND, lower extremity edema, dizziness, presyncope, syncope, snoring, daytime somnolence, bleeding, or neurologic sequela. The patient is tolerating medications without difficulties and is otherwise without complaint today.    Past Medical History:  Diagnosis Date   Allergic rhinitis    Aortic insufficiency    CAT SCRATCH 09/10/2009   Qualifier: Diagnosis of  By: Ethel Henry MD, Joaquim Muir    Clavicle fracture 12/2006   left   Diverticulosis    HTN (hypertension)    Osteopenia    Paroxysmal atrial fibrillation (HCC)    Pneumonia    PONV (postoperative nausea and vomiting)    Vision disturbance 03/13/2013   Negative neuro MRA MRI    Current Outpatient Medications  Medication Sig Dispense Refill   acetaminophen  (TYLENOL ) 500 MG tablet Take 325-500 mg by mouth as needed for moderate pain (pain score 4-6).     alendronate  (FOSAMAX ) 70 MG tablet TAKE 1 TABLET BY MOUTH WEEKLY  WITH A FULL GLASS OF WATER ON AN EMPTY STOMACH 12 tablet 3   apixaban  (ELIQUIS ) 5 MG TABS tablet TAKE 1 TABLET BY MOUTH TWICE  DAILY 200 tablet 1  Cholecalciferol (VITAMIN D3) 1000 units CAPS Take 1,000 Units by mouth daily.     Coenzyme Q10 (COQ10) 100 MG CAPS Take 100 mg by mouth daily with supper.     diltiazem  (CARDIZEM ) 30 MG tablet TAKE 1 TABLET EVERY 4 HOURS AS NEEDED FOR BREAKTHROUGH AFIB 45 tablet 1   diltiazem  (TIAZAC ) 300 MG 24 hr capsule TAKE 1 CAPSULE BY MOUTH DAILY 90 capsule 3   dofetilide  (TIKOSYN ) 250 MCG capsule Take 1 capsule (250 mcg total) by mouth 2 (two) times daily. 180 capsule 3   LORazepam  (ATIVAN ) 0.5 MG tablet Take 1 tablet (0.5 mg total) by mouth daily as needed for anxiety  (when flying). 20 tablet 0   metoprolol  tartrate (LOPRESSOR ) 25 MG tablet TAKE 2 TABLET BY MOUTH IN THE  MORNING AND 2 TABLETS IN THE  EVENING (total of 100 mg per day)     Multiple Vitamins-Minerals (PRESERVISION AREDS PO) Take 1 capsule by mouth in the morning and at bedtime.     potassium chloride  (KLOR-CON ) 10 MEQ tablet TAKE 2 TABLETS BY MOUTH DAILY 200 tablet 2   pravastatin  (PRAVACHOL ) 20 MG tablet TAKE 1 TABLET BY MOUTH IN THE  EVENING 100 tablet 2   No current facility-administered medications for this encounter.    ROS- All systems are reviewed and negative except as per the HPI above  Physical Exam: Vitals:   02/20/24 0817  Height: 5\' 4"  (1.626 m)    Wt Readings from Last 3 Encounters:  02/15/24 45.5 kg  12/26/23 45.4 kg  12/05/23 45.2 kg    GEN: Well nourished, well developed in no acute distress CARDIAC: Regular rate and rhythm, no rubs, gallops, soft diastolic murmur  RESPIRATORY:  Clear to auscultation without rales, wheezing or rhonchi  ABDOMEN: Soft, non-tender, non-distended EXTREMITIES:  No edema; No deformity     EKG today demonstrates SR Vent. rate 61 BPM PR interval 176 ms QRS duration 86 ms QT/QTcB 450/453 ms   Echo 04/03/23  1. Left ventricular ejection fraction, by estimation, is 60 to 65%. The  left ventricle has normal function. The left ventricle has no regional  wall motion abnormalities. Left ventricular diastolic parameters are  indeterminate.   2. Right ventricular systolic function is normal. The right ventricular  size is normal.   3. Left atrial size was mildly dilated.   4. The mitral valve is normal in structure. Trivial mitral valve  regurgitation.   5. The aortic valve is tricuspid. Aortic valve regurgitation is moderate.  Aortic valve sclerosis is present, with no evidence of aortic valve  stenosis.   6. There is mild dilatation of the ascending aorta, measuring 42 mm.   7. The inferior vena cava is normal in size with greater  than 50%  respiratory variability, suggesting right atrial pressure of 3 mmHg.   CHA2DS2-VASc Score = 4  The patient's score is based upon: CHF History: 0 HTN History: 1 Diabetes History: 0 Stroke History: 0 Vascular Disease History: 1 Age Score: 1 Gender Score: 1       ASSESSMENT AND PLAN: Paroxysmal Atrial Fibrillation (ICD10:  I48.0) The patient's CHA2DS2-VASc score is 4, indicating a 4.8% annual risk of stroke.   S/p ablation 02/01/21 and 09/03/23 S/p dofetilide  loading 05/2021 Patient in SR today, no further afib since increasing BB Continue dofetilide  250 mcg BID Continue Eliquis  5 mg BID Continue Lopressor  50 mg BID, new Rx sent Continue diltiazem  300 mg daily with 30 mg PRN for heart racing.   Secondary Hypercoagulable  State (ICD10:  [id number]) The patient is at significant risk for stroke/thromboembolism based upon her CHA2DS2-VASc Score of 4.  Continue Apixaban  (Eliquis ). No bleeding issues.   High Risk Medication Monitoring (ICD 10: Z79.899) QT interval on ECG acceptable for dofetilide  monitoring.   HTN Stable on current regimen  CAD CAC score 293 No anginal symptoms Followed by Dr Albert Huff  VHD Moderate AR Repeat echo scheduled for 02/29/24    Follow up with Mertha Abrahams in 3 months. AF clinic in 6 months.    Myrtha Ates PA-C [facility] [address] [place], [place] [address] [phone]

## 2024-02-21 ENCOUNTER — Telehealth: Payer: Self-pay | Admitting: Dietician

## 2024-02-21 NOTE — Telephone Encounter (Signed)
 Patient takes Fosamax  and her mail supply of this will be late.  She asked if we have samples.  Informed patient that we do not.  She is to resume taking this weekly when it arrives.  Cydne Doyne, RD, LDN, CDCES, DipACLM

## 2024-02-22 ENCOUNTER — Encounter: Payer: Self-pay | Admitting: Internal Medicine

## 2024-02-28 ENCOUNTER — Telehealth: Payer: Self-pay | Admitting: *Deleted

## 2024-02-28 DIAGNOSIS — Z1211 Encounter for screening for malignant neoplasm of colon: Secondary | ICD-10-CM

## 2024-02-28 NOTE — Telephone Encounter (Signed)
 Copied from CRM [id number]. Topic: Referral - Request for Referral >> Feb 28, 2024 10:14 AM Oddis Bench wrote: Did the patient discuss referral with their provider in the last year? No   Appointment offered? No  Type of order/referral and detailed reason for visit: Patient is in need of colonoscopy.  Preference of office, provider, location: [facility]  If referral order, have you been seen by this specialty before? Yes Colonoscopy, [facility]  Can we respond through MyChart? Yes

## 2024-02-28 NOTE — Telephone Encounter (Signed)
 Copied from CRM [id number]. Topic: Appointments - Appointment Info/Confirmation >> Feb 28, 2024 10:09 AM Oddis Bench wrote: Patient/patient representative is calling for information regarding an appointment. Patient is wanting to know if she can change her appt scheduled for 06/26 to another date bc she has another appt that conflicts with this one she is asking for Tammy to contact her asap.

## 2024-02-29 ENCOUNTER — Ambulatory Visit: Admitting: Cardiology

## 2024-02-29 ENCOUNTER — Ambulatory Visit (HOSPITAL_COMMUNITY): Attending: Cardiology

## 2024-02-29 DIAGNOSIS — I351 Nonrheumatic aortic (valve) insufficiency: Secondary | ICD-10-CM | POA: Insufficient documentation

## 2024-02-29 LAB — ECHOCARDIOGRAM COMPLETE
AR max vel: 2.89 cm2
AV Area VTI: 2.56 cm2
AV Area mean vel: 2.62 cm2
AV Mean grad: 7 mmHg
AV Peak grad: 13 mmHg
Ao pk vel: 1.8 m/s
Area-P 1/2: 2.99 cm2
Calc EF: 62.5 %
P 1/2 time: 550 ms
S' Lateral: 3 cm
Single Plane A2C EF: 62.3 %
Single Plane A4C EF: 61.4 %

## 2024-02-29 NOTE — Telephone Encounter (Signed)
 Spoke to pt. Pt inform she moved her physical appt in July and would like to get lab done prior for fasting lab.   Lab appt scheduled. Please advise on the order.    Pt states she is due for colonoscopy in June. Okay to go ahead and place the order?  Please advise.

## 2024-03-03 ENCOUNTER — Ambulatory Visit: Payer: Medicare Other | Admitting: Physician Assistant

## 2024-03-03 ENCOUNTER — Encounter: Payer: Self-pay | Admitting: *Deleted

## 2024-03-04 NOTE — Progress Notes (Signed)
 Chief Complaint  Patient presents with   Follow-up    On lab and MRI readings.     HPI: [name] [name] 73 y.o. come in for concerns  and fu of labs and scan  over last few months   Originally eval for borderline levated bun  but nl renal function   and  cystatin c and urine protein . Calcium  oxalat in urine and hx of soe microhematuria that was not peristnet  she is on anticoagulation  but no bleeding.  Renal us  nl except for  septated renal cyst that compelled a mri imagining showing bosniak 1 benign cyst  However  bile duct shows borderline enlargement  that mey be normal  but radiologist said could evaluate ercp if indicated   She has no  biliary sx . Doing ok  with medical issues .    ROS: See pertinent positives and negatives per HPI.  Past Medical History:  Diagnosis Date   Allergic rhinitis    Aortic insufficiency    CAT SCRATCH 09/10/2009   Qualifier: Diagnosis of  By: Ethel Henry MD, Joaquim Muir    Clavicle fracture 12/2006   left   Diverticulosis    HTN (hypertension)    Osteopenia    Paroxysmal atrial fibrillation (HCC)    Pneumonia    PONV (postoperative nausea and vomiting)    Vision disturbance 03/13/2013   Negative neuro MRA MRI    Family History  Problem Relation Age of Onset   Scleroderma Mother    Hyperlipidemia Mother    Coronary artery disease Father 86   Esophageal cancer Paternal Grandmother     Social History   Socioeconomic History   Marital status: Married    Spouse name: Not on file   Number of children: 0   Years of education: Not on file   Highest education level: Bachelor's degree (e.g., BA, AB, BS)  Occupational History   Occupation: Production manager   Occupation: REPORTER    Employer: [employer]  Tobacco Use   Smoking status: Never   Smokeless tobacco: Never   Tobacco comments:    Never smoked 06/28/23  Substance and Sexual Activity   Alcohol use: Not Currently   Drug use: No   Sexual activity: Not on file  Other Topics  Concern   Not on file  Social History Narrative   Married   hh of 2    p0g0   Works for [employer]    Social Drivers of Corporate investment banker Strain: Low Risk  (01/01/2024)   Overall Financial Resource Strain (CARDIA)    Difficulty of Paying Living Expenses: Not hard at all  Food Insecurity: No Food Insecurity (01/01/2024)   Hunger Vital Sign    Worried About Running Out of Food in the Last Year: Never true    Ran Out of Food in the Last Year: Never true  Transportation Needs: No Transportation Needs (01/01/2024)   PRAPARE - Administrator, Civil Service (Medical): No    Lack of Transportation (Non-Medical): No  Physical Activity: Sufficiently Active (01/01/2024)   Exercise Vital Sign    Days of Exercise per Week: 3 days    Minutes of Exercise per Session: 60 min  Recent Concern: Physical Activity - Insufficiently Active (12/26/2023)   Exercise Vital Sign    Days of Exercise per Week: 2 days    Minutes of Exercise per Session: 50 min  Stress: Stress Concern Present (01/01/2024)   [place]  Institute of Occupational Health - Occupational Stress Questionnaire    Feeling of Stress : To some extent  Social Connections: Socially Integrated (01/01/2024)   Social Connection and Isolation Panel [NHANES]    Frequency of Communication with Friends and Family: More than three times a week    Frequency of Social Gatherings with Friends and Family: Twice a week    Attends Religious Services: 1 to 4 times per year    Active Member of [employer] or Organizations: Yes    Attends Engineer, structural: More than 4 times per year    Marital Status: Married    Outpatient Medications Prior to Visit  Medication Sig Dispense Refill   acetaminophen  (TYLENOL ) 500 MG tablet Take 325-500 mg by mouth as needed for moderate pain (pain score 4-6).     alendronate  (FOSAMAX ) 70 MG tablet TAKE 1 TABLET BY MOUTH WEEKLY  WITH A FULL GLASS OF WATER ON AN EMPTY STOMACH 12 tablet 3   apixaban   (ELIQUIS ) 5 MG TABS tablet TAKE 1 TABLET BY MOUTH TWICE  DAILY 200 tablet 1   Cholecalciferol (VITAMIN D3) 1000 units CAPS Take 1,000 Units by mouth daily.     Coenzyme Q10 (COQ10) 100 MG CAPS Take 100 mg by mouth daily with supper.     diltiazem  (CARDIZEM ) 30 MG tablet TAKE 1 TABLET EVERY 4 HOURS AS NEEDED FOR BREAKTHROUGH AFIB 45 tablet 1   diltiazem  (TIAZAC ) 300 MG 24 hr capsule TAKE 1 CAPSULE BY MOUTH DAILY 90 capsule 3   dofetilide  (TIKOSYN ) 250 MCG capsule Take 1 capsule (250 mcg total) by mouth 2 (two) times daily. 180 capsule 3   LORazepam  (ATIVAN ) 0.5 MG tablet Take 1 tablet (0.5 mg total) by mouth daily as needed for anxiety (when flying). 20 tablet 0   metoprolol  tartrate (LOPRESSOR ) 50 MG tablet Take 1 tablet (50 mg total) by mouth 2 (two) times daily. 180 tablet 2   Multiple Vitamins-Minerals (PRESERVISION AREDS PO) Take 1 capsule by mouth in the morning and at bedtime.     potassium chloride  (KLOR-CON ) 10 MEQ tablet TAKE 2 TABLETS BY MOUTH DAILY 200 tablet 2   pravastatin  (PRAVACHOL ) 20 MG tablet TAKE 1 TABLET BY MOUTH IN THE  EVENING 100 tablet 2   No facility-administered medications prior to visit.     EXAM:  BP 128/70 (BP Location: Right Arm, Patient Position: Sitting, Cuff Size: Normal)   Pulse (!) 57   Temp 97.9 F (36.6 C) (Oral)   Ht 5\' 4"  (1.626 m)   Wt 100 lb 3.2 oz (45.5 kg)   SpO2 98%   BMI 17.20 kg/m   Body mass index is 17.2 kg/m.  GENERAL: vitals reviewed and listed above, alert, oriented, appears well hydrated and in no acute distressslender  HEENT: atraumatic, conjunctiva  clear, no obvious abnormalities on inspection of external nose and ears NECK: no obvious masses on inspection palpation  LUNGS: clear to auscultation bilaterally, no wheezes, rales or rhonchi, good air movement CV: HRRR, 2/6 sytolic diastolic m no clubbing cyanosis or  peripheral edema nl cap refill  MS: moves all extremities without noticeable focal  abnormality Abdomen:  Sof,t  normal bowel sounds without hepatosplenomegaly, no guarding rebound or masses no CVA tenderness PSYCH: pleasant and cooperative, no obvious depression or anxiety Lab Results  Component Value Date   WBC 5.3 08/21/2023   HGB 14.0 08/21/2023   HCT 43.3 08/21/2023   PLT 242 08/21/2023   GLUCOSE 99 11/01/2023   CHOL 136 06/27/2022  TRIG 64 06/27/2022   HDL 72 06/27/2022   LDLDIRECT 131.0 09/03/2009   LDLCALC 51 06/27/2022   ALT 14 01/11/2022   AST 19 01/11/2022   NA 140 11/01/2023   K 3.9 11/01/2023   CL 104 11/01/2023   CREATININE 0.83 11/01/2023   BUN 29 (H) 11/01/2023   CO2 29 11/01/2023   TSH 1.17 01/11/2022   MICROALBUR 3.0 (H) 11/01/2023   BP Readings from Last 3 Encounters:  03/05/24 128/70  02/20/24 122/62  02/15/24 (!) 118/90  IMPRESSION: MRI  1. Simple and thinly septated fluid signal cysts in the superior pole of the left kidney, including a cyst corresponding to prior ultrasound finding. No solid component or suspicious contrast enhancement. These are consistent with benign Bosniak category I and II cysts, for which no further follow-up or characterization is required. 2. Mild common bile duct dilatation, duct measuring up to 0.9 cm and tapering smoothly to the ampulla without calculus or other obstruction identified. This is generally of doubtful clinical significance in the absence of clinical or biochemical evidence of cholestasis, but could be further assessed by ERCP if so indicated. 3. Large burden of stool in the included colon.     Electronically Signed   By: Fredricka Jenny M.D.   On: 01/08/2024 16:00  ASSESSMENT AND PLAN:  Discussed the following assessment and plan:  Renal cyst - Plan: Basic metabolic panel with GFR, CBC with Differential/Platelet, Hepatic function panel, Lipid panel, TSH, Vitamin D , 25-hydroxy  Bile duct abnormality may be normal on MRI - Plan: Basic metabolic panel with GFR, CBC with Differential/Platelet, Hepatic function  panel, Lipid panel, TSH, Vitamin D , 25-hydroxy, Gamma GT, Ambulatory referral to Gastroenterology  Calcium  oxalate crystals in urine - at this time  not significant  no hx stones ither metabilic findings aware - Plan: Basic metabolic panel with GFR, CBC with Differential/Platelet, Hepatic function panel, Lipid panel, TSH, Vitamin D , 25-hydroxy  Chronic anticoagulation - Plan: Basic metabolic panel with GFR, CBC with Differential/Platelet, Hepatic function panel, Lipid panel, TSH, Vitamin D , 25-hydroxy  Elevated coronary artery calcium  score - Plan: Basic metabolic panel with GFR, CBC with Differential/Platelet, Hepatic function panel, Lipid panel, TSH, Vitamin D , 25-hydroxy  Hypercoagulable state due to paroxysmal atrial fibrillation (HCC) - Plan: Basic metabolic panel with GFR, CBC with Differential/Platelet, Hepatic function panel, Lipid panel, TSH, Vitamin D , 25-hydroxy  Dyslipidemia - Plan: Basic metabolic panel with GFR, CBC with Differential/Platelet, Hepatic function panel, Lipid panel, TSH, Vitamin D , 25-hydroxy, Gamma GT  Elevated BUN - Plan: Basic metabolic panel with GFR, CBC with Differential/Platelet, Hepatic function panel, Lipid panel, TSH, Vitamin D , 25-hydroxy  Medication management - Plan: Basic metabolic panel with GFR, CBC with Differential/Platelet, Hepatic function panel, Lipid panel, TSH, Vitamin D , 25-hydroxy, Gamma GT  Essential hypertension - Plan: Basic metabolic panel with GFR, CBC with Differential/Platelet, Hepatic function panel, Lipid panel, TSH, Vitamin D , 25-hydroxy, Gamma GT  Age-related osteoporosis without current pathological fracture - Plan: Basic metabolic panel with GFR, CBC with Differential/Platelet, Hepatic function panel, Lipid panel, TSH, Vitamin D , 25-hydroxy Uncertain sig of bile duct imaging  without sx  but get  lfts  earlier   lab prev scheduled for July  can do in next few weeks.     To have colonoscopy screening appt soon also routine.   Scenario of  minor abnormalities that  have had to fu and find other  abnormalities of uncertain significant .  -Patient advised to return or notify health care team  if  new concerns arise.  Patient Instructions  Move up date for lab appointment . To be able to see  liver panel results.   I may want to have a gi team look at  your results and data  to decide best path to  fu on the bile duct anatomy  Not concern about the kidneys   .     Aadon Gorelik K. Margie Urbanowicz M.D.

## 2024-03-05 ENCOUNTER — Ambulatory Visit (INDEPENDENT_AMBULATORY_CARE_PROVIDER_SITE_OTHER): Admitting: Internal Medicine

## 2024-03-05 ENCOUNTER — Encounter: Payer: Self-pay | Admitting: Internal Medicine

## 2024-03-05 VITALS — BP 128/70 | HR 57 | Temp 97.9°F | Ht 64.0 in | Wt 100.2 lb

## 2024-03-05 DIAGNOSIS — N281 Cyst of kidney, acquired: Secondary | ICD-10-CM

## 2024-03-05 DIAGNOSIS — R82998 Other abnormal findings in urine: Secondary | ICD-10-CM | POA: Diagnosis not present

## 2024-03-05 DIAGNOSIS — E785 Hyperlipidemia, unspecified: Secondary | ICD-10-CM

## 2024-03-05 DIAGNOSIS — M810 Age-related osteoporosis without current pathological fracture: Secondary | ICD-10-CM | POA: Diagnosis not present

## 2024-03-05 DIAGNOSIS — I480 Paroxysmal atrial fibrillation: Secondary | ICD-10-CM

## 2024-03-05 DIAGNOSIS — R799 Abnormal finding of blood chemistry, unspecified: Secondary | ICD-10-CM

## 2024-03-05 DIAGNOSIS — K839 Disease of biliary tract, unspecified: Secondary | ICD-10-CM

## 2024-03-05 DIAGNOSIS — R931 Abnormal findings on diagnostic imaging of heart and coronary circulation: Secondary | ICD-10-CM

## 2024-03-05 DIAGNOSIS — Z7901 Long term (current) use of anticoagulants: Secondary | ICD-10-CM | POA: Diagnosis not present

## 2024-03-05 DIAGNOSIS — D6869 Other thrombophilia: Secondary | ICD-10-CM | POA: Diagnosis not present

## 2024-03-05 DIAGNOSIS — Z79899 Other long term (current) drug therapy: Secondary | ICD-10-CM | POA: Diagnosis not present

## 2024-03-05 DIAGNOSIS — I10 Essential (primary) hypertension: Secondary | ICD-10-CM

## 2024-03-05 NOTE — Telephone Encounter (Signed)
 Colonoscopy order placed. Pt was seen today with Dr. Ethel Henry. Provider will order pt's future labs.

## 2024-03-05 NOTE — Patient Instructions (Signed)
 Move up date for lab appointment . To be able to see  liver panel results.   I may want to have a gi team look at  your results and data  to decide best path to  fu on the bile duct anatomy  Not concern about the kidneys   .

## 2024-03-11 ENCOUNTER — Encounter: Payer: Self-pay | Admitting: Internal Medicine

## 2024-03-12 ENCOUNTER — Other Ambulatory Visit

## 2024-03-13 ENCOUNTER — Other Ambulatory Visit (INDEPENDENT_AMBULATORY_CARE_PROVIDER_SITE_OTHER)

## 2024-03-13 ENCOUNTER — Ambulatory Visit: Payer: Self-pay | Admitting: Internal Medicine

## 2024-03-13 DIAGNOSIS — M810 Age-related osteoporosis without current pathological fracture: Secondary | ICD-10-CM | POA: Diagnosis not present

## 2024-03-13 DIAGNOSIS — D6869 Other thrombophilia: Secondary | ICD-10-CM | POA: Diagnosis not present

## 2024-03-13 DIAGNOSIS — R799 Abnormal finding of blood chemistry, unspecified: Secondary | ICD-10-CM | POA: Diagnosis not present

## 2024-03-13 DIAGNOSIS — R82998 Other abnormal findings in urine: Secondary | ICD-10-CM

## 2024-03-13 DIAGNOSIS — E785 Hyperlipidemia, unspecified: Secondary | ICD-10-CM

## 2024-03-13 DIAGNOSIS — I480 Paroxysmal atrial fibrillation: Secondary | ICD-10-CM | POA: Diagnosis not present

## 2024-03-13 DIAGNOSIS — Z7901 Long term (current) use of anticoagulants: Secondary | ICD-10-CM

## 2024-03-13 DIAGNOSIS — N281 Cyst of kidney, acquired: Secondary | ICD-10-CM | POA: Diagnosis not present

## 2024-03-13 DIAGNOSIS — K839 Disease of biliary tract, unspecified: Secondary | ICD-10-CM | POA: Diagnosis not present

## 2024-03-13 DIAGNOSIS — Z79899 Other long term (current) drug therapy: Secondary | ICD-10-CM

## 2024-03-13 DIAGNOSIS — I10 Essential (primary) hypertension: Secondary | ICD-10-CM

## 2024-03-13 DIAGNOSIS — R931 Abnormal findings on diagnostic imaging of heart and coronary circulation: Secondary | ICD-10-CM

## 2024-03-13 LAB — VITAMIN D 25 HYDROXY (VIT D DEFICIENCY, FRACTURES): VITD: 42.9 ng/mL (ref 30.00–100.00)

## 2024-03-13 LAB — BASIC METABOLIC PANEL WITH GFR
BUN: 23 mg/dL (ref 6–23)
CO2: 29 meq/L (ref 19–32)
Calcium: 9.4 mg/dL (ref 8.4–10.5)
Chloride: 103 meq/L (ref 96–112)
Creatinine, Ser: 0.68 mg/dL (ref 0.40–1.20)
GFR: 87.63 mL/min (ref >60.00)
Glucose, Bld: 90 mg/dL (ref 70–99)
Potassium: 3.8 meq/L (ref 3.5–5.1)
Sodium: 140 meq/L (ref 135–145)

## 2024-03-13 LAB — HEPATIC FUNCTION PANEL
ALT: 34 U/L (ref 0–35)
AST: 31 U/L (ref 0–37)
Albumin: 4.4 g/dL (ref 3.5–5.2)
Alkaline Phosphatase: 40 U/L (ref 39–117)
Bilirubin, Direct: 0.1 mg/dL (ref 0.0–0.3)
Total Bilirubin: 0.7 mg/dL (ref 0.2–1.2)
Total Protein: 7.3 g/dL (ref 6.0–8.3)

## 2024-03-13 LAB — LIPID PANEL
Cholesterol: 146 mg/dL (ref 0–200)
HDL: 72.6 mg/dL (ref >39.00)
LDL Cholesterol: 59 mg/dL (ref 0–99)
NonHDL: 73.65
Total CHOL/HDL Ratio: 2
Triglycerides: 75 mg/dL (ref 0.0–149.0)
VLDL: 15 mg/dL (ref 0.0–40.0)

## 2024-03-13 LAB — CBC WITH DIFFERENTIAL/PLATELET
Basophils Absolute: 0 10*3/uL (ref 0.0–0.1)
Basophils Relative: 0.6 % (ref 0.0–3.0)
Eosinophils Absolute: 0.1 10*3/uL (ref 0.0–0.7)
Eosinophils Relative: 1.7 % (ref 0.0–5.0)
HCT: 42 % (ref 36.0–46.0)
Hemoglobin: 14 g/dL (ref 12.0–15.0)
Lymphocytes Relative: 30.1 % (ref 12.0–46.0)
Lymphs Abs: 1.4 10*3/uL (ref 0.7–4.0)
MCHC: 33.2 g/dL (ref 30.0–36.0)
MCV: 98.1 fl (ref 78.0–100.0)
Monocytes Absolute: 0.5 10*3/uL (ref 0.1–1.0)
Monocytes Relative: 11 % (ref 3.0–12.0)
Neutro Abs: 2.6 10*3/uL (ref 1.4–7.7)
Neutrophils Relative %: 56.6 % (ref 43.0–77.0)
Platelets: 194 10*3/uL (ref 150.0–400.0)
RBC: 4.29 Mil/uL (ref 3.87–5.11)
RDW: 12.7 % (ref 11.5–15.5)
WBC: 4.6 10*3/uL (ref 4.0–10.5)

## 2024-03-13 LAB — TSH: TSH: 1.24 u[IU]/mL (ref 0.35–5.50)

## 2024-03-13 LAB — GAMMA GT: GGT: 16 U/L (ref 7–51)

## 2024-03-13 NOTE — Progress Notes (Signed)
 Good news  labs  including liver function  normal and reassuring  BUN now in normal range  Sharing with team

## 2024-03-14 ENCOUNTER — Other Ambulatory Visit: Payer: Self-pay | Admitting: Cardiology

## 2024-03-17 NOTE — Telephone Encounter (Signed)
 Attempted to reach pt. Left a voicemail to call us back.

## 2024-03-18 ENCOUNTER — Telehealth: Payer: Self-pay | Admitting: Internal Medicine

## 2024-03-18 NOTE — Telephone Encounter (Unsigned)
 Copied from CRM [id number]. Topic: General - Other >> Mar 17, 2024  3:50 PM Melissa C wrote: Reason for CRM: patient received message with voicemail to call back. Please call patient back as I do not see today's telephone call attached to any message to relay.

## 2024-03-20 NOTE — Telephone Encounter (Signed)
 Spoke to pt. Please see mychart message encounter.

## 2024-03-20 NOTE — Telephone Encounter (Signed)
 Contacted pt and explain to pt the dx is use to reason why the referral is for. Pt has GI appt on 03/31/2024. Unsure if abnormality bile duct is included in the appt or needs separate appt with diff. provider.   Inform pt this cma will call office and ask. Will follow up with pt. Pt verbalized understanding.

## 2024-03-21 ENCOUNTER — Other Ambulatory Visit: Payer: Self-pay | Admitting: Cardiology

## 2024-03-21 NOTE — Telephone Encounter (Signed)
 Contacted the [employer] office. Left a voicemail to call us  back.

## 2024-03-21 NOTE — Telephone Encounter (Signed)
 Contacted [facility] and spoke to [place]. She inform pt would need a separate appt for Bile Duct Abnormality referral.   Follow up with pt and inform pt of the message above. Pt request to send contact number to her mychart as she is currently driving.

## 2024-03-25 ENCOUNTER — Telehealth: Payer: Self-pay | Admitting: Cardiology

## 2024-03-25 MED ORDER — DILTIAZEM HCL ER BEADS 300 MG PO CP24
300.0000 mg | ORAL_CAPSULE | Freq: Every day | ORAL | 0 refills | Status: AC
Start: 2024-03-25 — End: ?

## 2024-03-25 NOTE — Telephone Encounter (Signed)
Spoke with pt, Refill sent to the pharmacy electronically.  

## 2024-03-25 NOTE — Telephone Encounter (Signed)
 Patient sent message via mychart:  Perhaps this message is best answered by Patric Booker or other pharmacists at [facility], but I might not have their correct phone number any longer. I am going away from Thursday through Saturday this week, and don't have enough Diltiazem  300 mg. capsules to see me through next Monday. [facility], my mail order pharmacy, is shipping my next 90-day supply but they might not arrive in time for me to take them with me this weekend. Is there anywhere that I can get two capsules to see me through? I will pay for them if needed, or make sure that I return with two capsules on Monday after my prescription arrives. Thank you for your consideration and please respond ASAP.

## 2024-03-28 NOTE — Progress Notes (Signed)
 03/31/2024 LAQUASIA PINCUS [id number] 02-Jun-1951  Referring provider: Reginal Capra, MD Primary GI doctor: Dr. Brice Campi  ASSESSMENT AND PLAN:  Screening colonoscopy 04/20/2014 colonoscopy with mild diverticulosis in the sigmoid and descending colon internal hemorrhoids and otherwise normal. It was thought rectal bleeding at that time was due to internal hemorrhoids which were treated with banding. Repeat recommended in 10 years.  We had a long discussion about the purpose of colon cancer screening and the different methods, including FIT test, Cologuard, virtual colonoscopy, and colonoscopy, including the benefits and their inherent limitations.  With shared decision making we have decided to proceed with the colonoscopy since patient appears to be in good health at this time.  We have discussed the risks of bleeding, infection, perforation, medication reactions,  and remote risk of death associated with colonoscopy.  All questions were answered and the patient acknowledges these risk and wishes to proceed. Will hold eliquis  for 2 days with permission  Atrial fibrillation  02/29/2024 echo EF 60 to 65%, moderate aortic regurgitation no aortic stenosis S/p 2 ablation 09/03/2023, has been over 6 months follows with Dr. Lavonne Prairie on Eliquis  Hold Eliquis  for 2 days before procedure will instruct when and how to resume after procedure.  Patient understands that there is a low but real risk of cardiovascular event such as heart attack, stroke, or embolism /  thrombosis, or ischemia while off Eliquis   The patient consents to proceed.  Will communicate by phone or EMR with patient's prescribing provider to confirm that holding Eliquis  is reasonable in this case.   Mild CBD dilation with tapering to ampulla, no calculus    Latest Ref Rng & Units 03/13/2024    7:50 AM 01/11/2022   10:45 AM 06/24/2021   10:54 AM  Hepatic Function  Total Protein 6.0 - 8.3 g/dL 7.3  8.2  6.6   Albumin 3.5 - 5.2  g/dL 4.4  4.1  2.8   AST 0 - 37 U/L 31  19  26    ALT 0 - 35 U/L 34  14  18   Alk Phosphatase 39 - 117 U/L 40  71  67   Total Bilirubin 0.2 - 1.2 mg/dL 0.7  0.5  1.2   Bilirubin, Direct 0.0 - 0.3 mg/dL 0.1  0.0     Platelets 194.0  No AB pain, no family history of gallbladder issues Mild common bile duct dilatation, duct measuring up to 0.9 cm and tapering smoothly to the ampulla without calculus or other obstruction identified. This is generally of doubtful clinical significance in the absence of clinical or biochemical evidence of cholestasis, but could be further assessed by ERCP if so indicated. - very mild CBD dilation at 0.9 - monitor LFTs, if any elevation will need ERCP - monitor for symptoms, go to ER if any symptoms - at this time likely can monitor imaging, suggest MRCP in 6 months will need ativan  1 mg for procedures.  - will also have Dr. Brice Campi evaluate the imaging and see if he feels EUS would be needed at this time.   Diverticulosis Will call if any symptoms. Add on fiber supplement, avoid NSAIDS, information given  Constipation wit history of internal hemorrhoids MRI abdomen with and without 12/27/2023 for renal lesion showed large burden of stool in the included colon She has BM daily , feels complete BM, soft stools, no straining  Patient Care Team: [name], Joaquim Muir, MD as PCP - General (Internal Medicine) Eilleen Grates, MD as PCP -  Cardiology (Cardiology) Lei Pump, MD as PCP - Electrophysiology (Cardiology) Maris Sickle, MD (Ophthalmology) Eilleen Grates, MD as Consulting Physician (Cardiology) Carnell Christian, [facility] (Pharmacist)  HISTORY OF PRESENT ILLNESS: 73 y.o. female with a past medical history listed below presents for evaluation of colonoscopy while on Eliquis .  Last seen in the office 05/18/2021 by PA Reginal Capra for flatulence.  Discussed the use of AI scribe software for clinical note transcription with the patient, who gave  verbal consent to proceed.  History of Present Illness   Everlynn Sagun Witting is a 73 year old female with diverticulosis and internal hemorrhoids who presents for follow-up regarding gastrointestinal issues and bile duct abnormality.  She has a history of diverticulosis and internal hemorrhoids, previously treated with banding during her last colonoscopy. No alarming changes in bowel habits, rectal bleeding, or melena since then. She has daily bowel movements that are soft and complete, with no straining.  She experiences 'ghosting' of the potassium pill shell in her stool, although her potassium levels remain normal at 3.8 mEq/L as of Mar 13, 2024. She is currently on Eliquis  and takes potassium supplements.  She has a history of two cardiac ablations, the most recent on September 03, 2023, and follows up with a cardiologist. She is on metoprolol , with a recent increase in dosage due to concerns about heart rate and blood pressure. She monitors her blood pressure at home, noting a reading of 110/60 mmHg.  She underwent an MRI for a renal lesion in February. No abdominal pain, nausea, or vomiting. There is no family history of gallbladder issues. She is scheduled for further evaluation of the bile duct abnormality.  She recalls a past episode of rectal bleeding attributed to hemorrhoids, which led her to seek emergency care. No current symptoms of rectal bleeding or dysphagia.        She  reports that she has never smoked. She has never used smokeless tobacco. She reports that she does not currently use alcohol. She reports that she does not use drugs.  RELEVANT GI HISTORY, IMAGING AND LABS: Results   LABS Potassium: 3.8 mEq/L (03/13/2024)  RADIOLOGY Abdominal MRI: Large stool burden, no gallbladder, bile duct 0.9 cm, tapered smoothly to the ampulla, no signs of stone or mass (12/2023)  DIAGNOSTIC Colonoscopy: Diverticulosis, internal hemorrhoids treated with banding (03/2024)      CBC     Component Value Date/Time   WBC 4.6 03/13/2024 0750   RBC 4.29 03/13/2024 0750   HGB 14.0 03/13/2024 0750   HGB 14.0 08/21/2023 0938   HCT 42.0 03/13/2024 0750   HCT 43.3 08/21/2023 0938   PLT 194.0 03/13/2024 0750   PLT 242 08/21/2023 0938   MCV 98.1 03/13/2024 0750   MCV 99 (H) 08/21/2023 0938   MCH 32.0 08/21/2023 0938   MCH 32.9 06/24/2021 1054   MCHC 33.2 03/13/2024 0750   RDW 12.7 03/13/2024 0750   RDW 11.4 (L) 08/21/2023 0938   LYMPHSABS 1.4 03/13/2024 0750   LYMPHSABS 1.3 01/10/2021 0755   MONOABS 0.5 03/13/2024 0750   EOSABS 0.1 03/13/2024 0750   EOSABS 0.1 01/10/2021 0755   BASOSABS 0.0 03/13/2024 0750   BASOSABS 0.0 01/10/2021 0755   Recent Labs    04/09/23 1202 08/21/23 0938 03/13/24 0750  HGB 13.7 14.0 14.0    CMP     Component Value Date/Time   NA 140 03/13/2024 0750   NA 141 08/21/2023 0938   K 3.8 03/13/2024 0750   CL 103 03/13/2024 0750  CO2 29 03/13/2024 0750   GLUCOSE 90 03/13/2024 0750   BUN 23 03/13/2024 0750   BUN 25 08/21/2023 0938   CREATININE 0.68 03/13/2024 0750   CREATININE 0.82 09/28/2020 0822   CALCIUM  9.4 03/13/2024 0750   PROT 7.3 03/13/2024 0750   ALBUMIN 4.4 03/13/2024 0750   AST 31 03/13/2024 0750   ALT 34 03/13/2024 0750   ALKPHOS 40 03/13/2024 0750   BILITOT 0.7 03/13/2024 0750   GFRNONAA >60 10/01/2023 1521   GFRNONAA 74 09/28/2020 0822   GFRAA 86 09/28/2020 0822      Latest Ref Rng & Units 03/13/2024    7:50 AM 01/11/2022   10:45 AM 06/24/2021   10:54 AM  Hepatic Function  Total Protein 6.0 - 8.3 g/dL 7.3  8.2  6.6   Albumin 3.5 - 5.2 g/dL 4.4  4.1  2.8   AST 0 - 37 U/L 31  19  26    ALT 0 - 35 U/L 34  14  18   Alk Phosphatase 39 - 117 U/L 40  71  67   Total Bilirubin 0.2 - 1.2 mg/dL 0.7  0.5  1.2   Bilirubin, Direct 0.0 - 0.3 mg/dL 0.1  0.0        Current Medications:   Current Outpatient Medications (Endocrine & Metabolic):    alendronate  (FOSAMAX ) 70 MG tablet, TAKE 1 TABLET BY MOUTH WEEKLY  WITH A FULL  GLASS OF WATER ON AN EMPTY STOMACH  Current Outpatient Medications (Cardiovascular):    diltiazem  (CARDIZEM ) 30 MG tablet, TAKE 1 TABLET EVERY 4 HOURS AS NEEDED FOR BREAKTHROUGH AFIB   diltiazem  (TIAZAC ) 300 MG 24 hr capsule, Take 1 capsule (300 mg total) by mouth daily.   dofetilide  (TIKOSYN ) 250 MCG capsule, Take 1 capsule (250 mcg total) by mouth 2 (two) times daily.   metoprolol  tartrate (LOPRESSOR ) 50 MG tablet, Take 1 tablet (50 mg total) by mouth 2 (two) times daily.   pravastatin  (PRAVACHOL ) 20 MG tablet, TAKE 1 TABLET BY MOUTH IN THE  EVENING   Current Outpatient Medications (Analgesics):    acetaminophen  (TYLENOL ) 500 MG tablet, Take 325-500 mg by mouth as needed for moderate pain (pain score 4-6).  Current Outpatient Medications (Hematological):    apixaban  (ELIQUIS ) 5 MG TABS tablet, TAKE 1 TABLET BY MOUTH TWICE  DAILY  Current Outpatient Medications (Other):    Cholecalciferol (VITAMIN D3) 1000 units CAPS, Take 1,000 Units by mouth daily.   Coenzyme Q10 (COQ10) 100 MG CAPS, Take 100 mg by mouth daily with supper.   LORazepam  (ATIVAN ) 0.5 MG tablet, Take 1 tablet (0.5 mg total) by mouth daily as needed for anxiety (when flying).   Multiple Vitamins-Minerals (PRESERVISION AREDS PO), Take 1 capsule by mouth in the morning and at bedtime.   Na Sulfate-K Sulfate-Mg Sulfate concentrate (SUPREP) 17.5-3.13-1.6 GM/177ML SOLN, Take 1 kit (354 mLs total) by mouth once for 1 dose.   potassium chloride  (KLOR-CON ) 10 MEQ tablet, TAKE 2 TABLETS BY MOUTH DAILY  Medical History:  Past Medical History:  Diagnosis Date   Allergic rhinitis    Aortic insufficiency    CAT SCRATCH 09/10/2009   Qualifier: Diagnosis of  By: Ethel Henry MD, Joaquim Muir    Clavicle fracture 12/2006   left   Diverticulosis    HTN (hypertension)    Osteopenia    Paroxysmal atrial fibrillation (HCC)    Pneumonia    PONV (postoperative nausea and vomiting)    Vision disturbance 03/13/2013   Negative neuro MRA MRI  Allergies:  Allergies  Allergen Reactions   Latex Hives   Other Rash and Other (See Comments)    Adhesive pads left on skin for extended periods = BURNS AND RASHES ON THE SKIN!!!!   Codeine Phosphate Other (See Comments)    Drowsiness    Demerol [Meperidine] Nausea And Vomiting   Protonix  [Pantoprazole ] Rash   Sulfa  Antibiotics Rash   Tens Therapy Replace Back Pads Itching and Rash     Surgical History:  She  has a past surgical history that includes Tonsillectomy and adenoidectomy; Laparoscopic salpingo oophorectomy (Right); Appendectomy; Abdominal hysterectomy; ATRIAL FIBRILLATION ABLATION (N/A, 02/01/2021); Video bronchoscopy (N/A, 09/05/2021); Bronchial washings (09/05/2021); and ATRIAL FIBRILLATION ABLATION (N/A, 09/03/2023). Family History:  Her family history includes Coronary artery disease (age of onset: 45) in her father; Esophageal cancer in her paternal grandmother; Hyperlipidemia in her mother; Scleroderma in her mother.  REVIEW OF SYSTEMS  : All other systems reviewed and negative except where noted in the History of Present Illness.  PHYSICAL EXAM: BP 110/60 (BP Location: Left Arm, Patient Position: Sitting)   Pulse 68   Ht 5' 3.5" (1.613 m) Comment: height measured without shoes  Wt 100 lb (45.4 kg)   BMI 17.44 kg/m  Physical Exam   VITALS: BP- 110/60 GENERAL APPEARANCE: Well nourished, in no apparent distress. HEENT: No cervical lymphadenopathy, unremarkable thyroid , sclerae anicteric, conjunctiva pink. RESPIRATORY: Respiratory effort normal, breath sounds equal bilaterally without rales, rhonchi, or wheezing. CARDIO: Regular rate and rhythm with a systolic murmur present, peripheral pulses intact. ABDOMEN: Soft, non-distended, active bowel sounds in all four quadrants, no tenderness to palpation, no rebound, no mass appreciated. RECTAL: Declines. MUSCULOSKELETAL: Full range of motion, normal gait, without edema. SKIN: Dry, intact without rashes or lesions. No  jaundice. NEURO: Alert, oriented, no focal deficits. PSYCH: Cooperative, normal mood and affect.      Edmonia Gottron, PA-C 11:33 AM

## 2024-03-30 ENCOUNTER — Encounter: Payer: Self-pay | Admitting: Internal Medicine

## 2024-03-31 ENCOUNTER — Encounter: Payer: Self-pay | Admitting: Cardiology

## 2024-03-31 ENCOUNTER — Ambulatory Visit: Admitting: Physician Assistant

## 2024-03-31 ENCOUNTER — Encounter: Payer: Self-pay | Admitting: Physician Assistant

## 2024-03-31 ENCOUNTER — Telehealth: Payer: Self-pay | Admitting: *Deleted

## 2024-03-31 VITALS — BP 110/60 | HR 68 | Ht 63.5 in | Wt 100.0 lb

## 2024-03-31 DIAGNOSIS — K838 Other specified diseases of biliary tract: Secondary | ICD-10-CM | POA: Diagnosis not present

## 2024-03-31 DIAGNOSIS — Z7901 Long term (current) use of anticoagulants: Secondary | ICD-10-CM

## 2024-03-31 DIAGNOSIS — K5900 Constipation, unspecified: Secondary | ICD-10-CM | POA: Diagnosis not present

## 2024-03-31 DIAGNOSIS — I4891 Unspecified atrial fibrillation: Secondary | ICD-10-CM | POA: Diagnosis not present

## 2024-03-31 DIAGNOSIS — I4811 Longstanding persistent atrial fibrillation: Secondary | ICD-10-CM

## 2024-03-31 DIAGNOSIS — Z1211 Encounter for screening for malignant neoplasm of colon: Secondary | ICD-10-CM

## 2024-03-31 DIAGNOSIS — I351 Nonrheumatic aortic (valve) insufficiency: Secondary | ICD-10-CM

## 2024-03-31 DIAGNOSIS — K5730 Diverticulosis of large intestine without perforation or abscess without bleeding: Secondary | ICD-10-CM

## 2024-03-31 MED ORDER — NA SULFATE-K SULFATE-MG SULF 17.5-3.13-1.6 GM/177ML PO SOLN: 1.0000 | Freq: Once | ORAL | 0 refills | Status: AC

## 2024-03-31 NOTE — Telephone Encounter (Signed)
 [facility] Pre-operative Risk Assessment     Request for surgical clearance:     Endoscopy Procedure  What type of surgery is being performed?     Colonoscopy  When is this surgery scheduled?     05/07/2024  What type of clearance is required ?   Pharmacy  Are there any medications that need to be held prior to surgery and how long? ELIQUIS  2 days  Practice name and name of physician performing surgery?      [facility]  What is your office phone and fax number?      Phone- [phone]  Fax- [phone]  Anesthesia type (None, local, MAC, general) ?       MAC   Please route your response to Royanne Core CMA

## 2024-03-31 NOTE — Patient Instructions (Addendum)
 Toileting tips to help with your constipation - Drink at least 64-80 ounces of water/liquid per day. - Establish a time to try to move your bowels every day.  For many people, this is after a cup of coffee or after a meal such as breakfast. - Sit all of the way back on the toilet keeping your back fairly straight and while sitting up, try to rest the tops of your forearms on your upper thighs.   - Raising your feet with a step stool/squatty potty can be helpful to improve the angle that allows your stool to pass through the rectum. - Relax the rectum feeling it bulge toward the toilet water.  If you feel your rectum raising toward your body, you are contracting rather than relaxing. - Breathe in and slowly exhale. "Belly breath" by expanding your belly towards your belly button. Keep belly expanded as you gently direct pressure down and back to the anus.  A low pitched GRRR sound can assist with increasing intra-abdominal pressure.  (Can also trying to blow on a pinwheel and make it move, this helps with the same belly breathing) - Repeat 3-4 times. If unsuccessful, contract the pelvic floor to restore normal tone and get off the toilet.  Avoid excessive straining. - To reduce excessive wiping by teaching your anus to normally contract, place hands on outer aspect of knees and resist knee movement outward.  Hold 5-10 second then place hands just inside of knees and resist inward movement of knees.  Hold 5 seconds.  Repeat a few times each way.  Go to the ER if unable to pass gas, severe AB pain, unable to hold down food, any shortness of breath of chest pain.  Miralax is an osmotic laxative.  It only brings more water into the stool.  This is safe to take daily.  Can take up to 17 gram of miralax twice a day.  Mix with juice or coffee.  Start 1 capful at night for 3-4 days and reassess your response in 3-4 days.  You can increase and decrease the dose based on your response.  Remember, it can  take up to 3-4 days to take effect OR for the effects to wear off.   I often pair this with benefiber in the morning to help assure the stool is not too loose.   You have been scheduled for a colonoscopy. Please follow written instructions given to you at your visit today.   If you use inhalers (even only as needed), please bring them with you on the day of your procedure.  DO NOT TAKE 7 DAYS PRIOR TO TEST- Trulicity (dulaglutide) Ozempic, Wegovy (semaglutide) Mounjaro (tirzepatide) Bydureon Bcise (exanatide extended release)  DO NOT TAKE 1 DAY PRIOR TO YOUR TEST Rybelsus (semaglutide) Adlyxin (lixisenatide) Victoza (liraglutide) Byetta (exanatide) ___________________________________________________________________________    Due to recent changes in healthcare laws, you may see the results of your imaging and laboratory studies on MyChart before your provider has had a chance to review them.  We understand that in some cases there may be results that are confusing or concerning to you. Not all laboratory results come back in the same time frame and the provider may be waiting for multiple results in order to interpret others.  Please give us  48 hours in order for your provider to thoroughly review all the results before contacting the office for clarification of your results.    I appreciate the  opportunity to care for you  Thank You  Amanda Collier,PA-C

## 2024-03-31 NOTE — Telephone Encounter (Signed)
 Patient with diagnosis of atrial fibrillation on Eliquis  for anticoagulation.    What type of surgery is being performed?     Colonoscopy  When is this surgery scheduled?     05/07/2024    CHA2DS2-VASc Score = 4   This indicates a 4.8% annual risk of stroke. The patient's score is based upon: CHF History: 0 HTN History: 1 Diabetes History: 0 Stroke History: 0 Vascular Disease History: 1 Age Score: 1 Gender Score: 1    CrCl 54 Platelet count 194  Patient has not had an Afib/aflutter ablation within the last 3 months or DCCV within the last 30 days  Per office protocol, patient can hold Eliquis  for 2 days prior to procedure.   Patient will not need bridging with Lovenox (enoxaparin) around procedure.  **This guidance is not considered finalized until pre-operative APP has relayed final recommendations.**

## 2024-03-31 NOTE — Telephone Encounter (Signed)
   Patient Name: [name] [name]  DOB: [birth year] MRN: [id number]  Primary Cardiologist: Eilleen Grates, MD  Clinical pharmacists have reviewed the patient's past medical history, labs, and current medications as part of preoperative protocol coverage. The following recommendations have been made: CHA2DS2-VASc Score = 4   This indicates a 4.8% annual risk of stroke. The patient's score is based upon: CHF History: 0 HTN History: 1 Diabetes History: 0 Stroke History: 0 Vascular Disease History: 1 Age Score: 1 Gender Score: 1   CrCl 54 Platelet count 194   Patient has not had an Afib/aflutter ablation within the last 3 months or DCCV within the last 30 days   Per office protocol, patient can hold Eliquis  for 2 days prior to procedure. Please resume when safe to do so from a bleeding standpoint. Patient will not need bridging with Lovenox (enoxaparin) around procedure.  I will route this recommendation to the requesting party via Epic fax function and remove from pre-op pool.  Please call with questions.  Nikolette Reindl D Rod Majerus, NP 03/31/2024, 1:48 PM

## 2024-04-01 ENCOUNTER — Telehealth: Payer: Self-pay

## 2024-04-01 ENCOUNTER — Other Ambulatory Visit: Payer: Self-pay | Admitting: Cardiology

## 2024-04-01 ENCOUNTER — Other Ambulatory Visit: Payer: Self-pay

## 2024-04-01 DIAGNOSIS — K838 Other specified diseases of biliary tract: Secondary | ICD-10-CM

## 2024-04-01 NOTE — Telephone Encounter (Signed)
Patient returning call. Please advise, thank you

## 2024-04-01 NOTE — Telephone Encounter (Signed)
 Attending Physician's Attestation   I have reviewed the chart.   I agree with the Advanced Practitioner's note, impression, and recommendations with any updates as below. Agree with endoscopic evaluation as outlined for colon cancer screening. With regards to mild ductal dilation with patient still having gallbladder, patient will need upper EUS (thankfully LFTs are normal) to rule out ampullary lesion.  Would not proceed with ERCP at this time.  This can be scheduled separately since it would be to be done in the hospital-based setting. Agree with anticoagulation hold for at least 1 to 2 days, cardiology also agreeing.   Yong Henle, MD [facility] Advanced Endoscopy Office # [phone]

## 2024-04-01 NOTE — Progress Notes (Signed)
 Attending Physician's Attestation   I have reviewed the chart.   I agree with the Advanced Practitioner's note, impression, and recommendations with any updates as below. Agree with endoscopic evaluation as outlined for colon cancer screening. With regards to mild ductal dilation with patient still having gallbladder, patient will need upper EUS (thankfully LFTs are normal) to rule out ampullary lesion.  Would not proceed with ERCP at this time.  This can be scheduled separately since it would be to be done in the hospital-based setting. Agree with anticoagulation hold for at least 1 to 2 days, cardiology also agreeing.   Yong Henle, MD [facility] Advanced Endoscopy Office # [phone]

## 2024-04-01 NOTE — Telephone Encounter (Signed)
 I spoke to patient yesterday and explained she needs to hold Eliquis  2 days, She had some concerns about holding it. I told her to contact Dr Aaron Aas office with her questions

## 2024-04-01 NOTE — Telephone Encounter (Signed)
 EUS has been entered for 06/05/24 at [facility] with GM at 3 pm

## 2024-04-01 NOTE — Telephone Encounter (Signed)
-----   Message from [facility] sent at 04/01/2024  6:01 AM EDT -----    ----- Message ----- From: Devorah Fonder Sent: 03/31/2024  11:34 AM EDT To: Normie Becton., MD  For the CBD, normal LFTS, no pain, monitor with imaging or would you want side view EGD for ampulla plus or minus EUS.  Mylinda Asa

## 2024-04-01 NOTE — Telephone Encounter (Signed)
 Left message on machine to call back

## 2024-04-01 NOTE — Telephone Encounter (Signed)
EUS scheduled, pt instructed and medications reviewed.  Patient instructions mailed to home and sent to My Chart .  Patient to call with any questions or concerns.  

## 2024-04-03 ENCOUNTER — Other Ambulatory Visit (HOSPITAL_COMMUNITY)

## 2024-04-04 ENCOUNTER — Encounter: Payer: Self-pay | Admitting: Adult Health

## 2024-04-04 ENCOUNTER — Encounter: Payer: Self-pay | Admitting: Cardiology

## 2024-04-04 ENCOUNTER — Ambulatory Visit: Admitting: Adult Health

## 2024-04-04 VITALS — BP 100/50 | HR 55 | Temp 98.1°F | Ht 63.5 in | Wt 100.0 lb

## 2024-04-04 DIAGNOSIS — S0086XA Insect bite (nonvenomous) of other part of head, initial encounter: Secondary | ICD-10-CM

## 2024-04-04 DIAGNOSIS — W57XXXA Bitten or stung by nonvenomous insect and other nonvenomous arthropods, initial encounter: Secondary | ICD-10-CM

## 2024-04-04 MED ORDER — DOXYCYCLINE HYCLATE 100 MG PO CAPS: 200.0000 mg | ORAL_CAPSULE | Freq: Once | ORAL | 0 refills | Status: AC

## 2024-04-04 NOTE — Progress Notes (Signed)
 Subjective:    Patient ID: [name] [name], female    DOB: 24-Nov-1950, 73 y.o.   MRN: [id number]  HPI 73 year old female who  has a past medical history of Allergic rhinitis, Aortic insufficiency, CAT SCRATCH (09/10/2009), Clavicle fracture (12/2006), Diverticulosis, HTN (hypertension), Osteopenia, Paroxysmal atrial fibrillation (HCC), Pneumonia, PONV (postoperative nausea and vomiting), and Vision disturbance (03/13/2013).  She is being evaluated today for an acute issue.  She reports that she was in [place]  and approximately a week ago and while in [place]  her husband noticed that she had a tick on her left jawline.  Her husband was able to remove the tick in its entirety.  She is not entirely sure of how long the tick was attached.  She brought the tick with her and it appears to be a deer tick.  She does not have any bull's-eye rash, body aches, experienced any fevers, chills, or feeling acutely ill.   Review of Systems See HPI   Past Medical History:  Diagnosis Date   Allergic rhinitis    Aortic insufficiency    CAT SCRATCH 09/10/2009   Qualifier: Diagnosis of  By: Ethel Henry MD, Joaquim Muir    Clavicle fracture 12/2006   left   Diverticulosis    HTN (hypertension)    Osteopenia    Paroxysmal atrial fibrillation (HCC)    Pneumonia    PONV (postoperative nausea and vomiting)    Vision disturbance 03/13/2013   Negative neuro MRA MRI    Social History   Socioeconomic History   Marital status: Married    Spouse name: Not on file   Number of children: 0   Years of education: Not on file   Highest education level: Bachelor's degree (e.g., BA, AB, BS)  Occupational History   Occupation: Production manager   Occupation: REPORTER    Employer: [employer]  Tobacco Use   Smoking status: Never   Smokeless tobacco: Never   Tobacco comments:    Never smoked 06/28/23  Vaping Use   Vaping status: Never Used  Substance and Sexual Activity   Alcohol use: Not Currently    Drug use: No   Sexual activity: Not on file  Other Topics Concern   Not on file  Social History Narrative   Married   hh of 2    p0g0   Works for [employer]    Social Drivers of Corporate investment banker Strain: Low Risk  (01/01/2024)   Overall Financial Resource Strain (CARDIA)    Difficulty of Paying Living Expenses: Not hard at all  Food Insecurity: No Food Insecurity (01/01/2024)   Hunger Vital Sign    Worried About Running Out of Food in the Last Year: Never true    Ran Out of Food in the Last Year: Never true  Transportation Needs: No Transportation Needs (01/01/2024)   PRAPARE - Administrator, Civil Service (Medical): No    Lack of Transportation (Non-Medical): No  Physical Activity: Sufficiently Active (01/01/2024)   Exercise Vital Sign    Days of Exercise per Week: 3 days    Minutes of Exercise per Session: 60 min  Recent Concern: Physical Activity - Insufficiently Active (12/26/2023)   Exercise Vital Sign    Days of Exercise per Week: 2 days    Minutes of Exercise per Session: 50 min  Stress: Stress Concern Present (01/01/2024)   [employer] of Occupational Health - Occupational Stress Questionnaire    Feeling of Stress :  To some extent  Social Connections: Socially Integrated (01/01/2024)   Social Connection and Isolation Panel [NHANES]    Frequency of Communication with Friends and Family: More than three times a week    Frequency of Social Gatherings with Friends and Family: Twice a week    Attends Religious Services: 1 to 4 times per year    Active Member of [employer] or Organizations: Yes    Attends Banker Meetings: More than 4 times per year    Marital Status: Married  Catering manager Violence: Not At Risk (12/26/2023)   Humiliation, Afraid, Rape, and Kick questionnaire    Fear of Current or Ex-Partner: No    Emotionally Abused: No    Physically Abused: No    Sexually Abused: No    Past Surgical History:  Procedure Laterality  Date   ABDOMINAL HYSTERECTOMY     pt denies 04/15/14   APPENDECTOMY     ATRIAL FIBRILLATION ABLATION N/A 02/01/2021   Procedure: ATRIAL FIBRILLATION ABLATION;  Surgeon: Jolly Needle, MD;  Location: [facility];  Service: Cardiovascular;  Laterality: N/A;   ATRIAL FIBRILLATION ABLATION N/A 09/03/2023   Procedure: ATRIAL FIBRILLATION ABLATION;  Surgeon: Lei Pump, MD;  Location: [facility];  Service: Cardiovascular;  Laterality: N/A;   BRONCHIAL WASHINGS  09/05/2021   Procedure: BRONCHIAL WASHINGS;  Surgeon: Denson Flake, MD;  Location: [facility];  Service: Cardiopulmonary;;   LAPAROSCOPIC SALPINGO OOPHERECTOMY Right    TONSILLECTOMY AND ADENOIDECTOMY     VIDEO BRONCHOSCOPY N/A 09/05/2021   Procedure: VIDEO BRONCHOSCOPY WITHOUT FLUORO;  Surgeon: Denson Flake, MD;  Location: [facility];  Service: Cardiopulmonary;  Laterality: N/A;    Family History  Problem Relation Age of Onset   Scleroderma Mother    Hyperlipidemia Mother    Coronary artery disease Father 47   Esophageal cancer Paternal Grandmother     Allergies  Allergen Reactions   Latex Hives   Other Rash and Other (See Comments)    Adhesive pads left on skin for extended periods = BURNS AND RASHES ON THE SKIN!!!!   Codeine Phosphate Other (See Comments)    Drowsiness    Demerol [Meperidine] Nausea And Vomiting   Protonix  [Pantoprazole ] Rash   Sulfa  Antibiotics Rash   Tens Therapy Replace Back Pads Itching and Rash    Current Outpatient Medications on File Prior to Visit  Medication Sig Dispense Refill   acetaminophen  (TYLENOL ) 500 MG tablet Take 325-500 mg by mouth as needed for moderate pain (pain score 4-6).     alendronate  (FOSAMAX ) 70 MG tablet TAKE 1 TABLET BY MOUTH WEEKLY  WITH A FULL GLASS OF WATER ON AN EMPTY STOMACH 12 tablet 3   apixaban  (ELIQUIS ) 5 MG TABS tablet TAKE 1 TABLET BY MOUTH TWICE  DAILY 200 tablet 1   Cholecalciferol (VITAMIN D3) 1000 units CAPS Take 1,000 Units by  mouth daily.     Coenzyme Q10 (COQ10) 100 MG CAPS Take 100 mg by mouth daily with supper.     diltiazem  (CARDIZEM ) 30 MG tablet TAKE 1 TABLET EVERY 4 HOURS AS NEEDED FOR BREAKTHROUGH AFIB 45 tablet 1   diltiazem  (TIAZAC ) 300 MG 24 hr capsule Take 1 capsule (300 mg total) by mouth daily. 5 capsule 0   dofetilide  (TIKOSYN ) 250 MCG capsule TAKE 1 CAPSULE BY MOUTH 2 TIMES DAILY. 180 capsule 2   LORazepam  (ATIVAN ) 0.5 MG tablet Take 1 tablet (0.5 mg total) by mouth daily as needed for anxiety (when flying). 20  tablet 0   metoprolol  tartrate (LOPRESSOR ) 50 MG tablet Take 1 tablet (50 mg total) by mouth 2 (two) times daily. 180 tablet 2   Multiple Vitamins-Minerals (PRESERVISION AREDS PO) Take 1 capsule by mouth in the morning and at bedtime.     potassium chloride  (KLOR-CON ) 10 MEQ tablet TAKE 2 TABLETS BY MOUTH DAILY 200 tablet 2   pravastatin  (PRAVACHOL ) 20 MG tablet TAKE 1 TABLET BY MOUTH IN THE  EVENING 100 tablet 2   No current facility-administered medications on file prior to visit.    BP (!) 100/50   Pulse (!) 55   Temp 98.1 F (36.7 C) (Oral)   Ht 5' 3.5" (1.613 m)   Wt 100 lb (45.4 kg)   SpO2 95%   BMI 17.44 kg/m       Objective:   Physical Exam Vitals and nursing note reviewed.  Constitutional:      Appearance: Normal appearance.  Neck:   Musculoskeletal:        General: Normal range of motion.  Skin:    General: Skin is warm and dry.  Neurological:     General: No focal deficit present.     Mental Status: She is alert and oriented to person, place, and time.  Psychiatric:        Mood and Affect: Mood normal.        Behavior: Behavior normal.        Thought Content: Thought content normal.        Judgment: Judgment normal.        Assessment & Plan:  1. Tick bite of other part of head, initial encounter (Primary) - Since she does not know how long the tick was attached we will treat with a single dose of doxycycline 200 mg. - Follow up as needed - doxycycline  (VIBRAMYCIN) 100 MG capsule; Take 2 capsules (200 mg total) by mouth once for 1 dose.  Dispense: 2 capsule; Refill: 0  Alto Atta, NP

## 2024-04-18 ENCOUNTER — Other Ambulatory Visit: Payer: Self-pay | Admitting: Internal Medicine

## 2024-04-21 ENCOUNTER — Ambulatory Visit: Admitting: Physician Assistant

## 2024-04-22 ENCOUNTER — Encounter: Payer: Self-pay | Admitting: Cardiology

## 2024-04-22 ENCOUNTER — Telehealth: Payer: Self-pay | Admitting: Gastroenterology

## 2024-04-22 NOTE — Telephone Encounter (Signed)
 Patient just called to confirm that cardiac clearance for her Eliquis  has been done for colon scheduled for 05/06/24 and EGD scheduled at [facility] for 06/05/24.  Her cardiologist is Dr. Lavona and the cardiac clearance request should be faxed to [phone], ATTN:  Preop.  Please let patient know regarding the holding of her Eliquis .  Thank you.

## 2024-04-22 NOTE — Telephone Encounter (Signed)
 Pt aware that the ok to hold was given on 03/31/2024. She was advised at that time and re assured today.  Nothing further needed.

## 2024-04-24 ENCOUNTER — Encounter: Admitting: Internal Medicine

## 2024-04-25 ENCOUNTER — Encounter: Payer: Self-pay | Admitting: Cardiology

## 2024-04-28 ENCOUNTER — Encounter: Payer: Self-pay | Admitting: Gastroenterology

## 2024-04-28 NOTE — Progress Notes (Unsigned)
 Cardiology Office Note:   Date:  04/29/2024  ID:  [name] [name], DOB 11-27-1950, MRN [id number] PCP: Charlett Apolinar POUR, MD  [facility] Providers Cardiologist:  Lynwood Schilling, MD Electrophysiologist:  Will Gladis Norton, MD {  History of Present Illness:   [name] [name] is a 73 y.o. female who for follow up of palpitations and aortic insufficiency.  She has paroxysmal atrial fibrillation. She is status post ablation.   She had Covid and she had increased fib post this.  She was started on Tikosyn .   An echo in May 2025 demonstrated NL LV function and moderate AI and TR.    She has had few palpitations since she was last seen.  Since I last saw her she was seen by DrTolia and in the atrial fibrillation clinic.  She was given a slightly higher dose of beta-blocker.  The patient denies any new symptoms such as chest discomfort, neck or arm discomfort. There has been no new shortness of breath, PND or orthopnea. There have been no reported palpitations, presyncope or syncope. She does exercise two times per week.    ROS: As stated in the HPI and negative for all other systems.  Studies Reviewed:    EKG:     NA  Risk Assessment/Calculations:    CHA2DS2-VASc Score = 4   This indicates a 4.8% annual risk of stroke. The patient's score is based upon: CHF History: 0 HTN History: 1 Diabetes History: 0 Stroke History: 0 Vascular Disease History: 1 Age Score: 1 Gender Score: 1  Physical Exam:   VS:  BP (!) 118/52   Pulse 64   Ht 5' 4 (1.626 m)   Wt 99 lb (44.9 kg)   SpO2 98%   BMI 16.99 kg/m    Wt Readings from Last 3 Encounters:  04/29/24 99 lb (44.9 kg)  04/04/24 100 lb (45.4 kg)  03/31/24 100 lb (45.4 kg)     GEN: Well nourished, well developed in no acute distress NECK: No JVD; No carotid bruits CARDIAC: RRR, 2 out of 6 systolic murmur heard at the third left interspace, 3 out of 6 diastolic murmur heard throughout the precordium, rubs, gallops RESPIRATORY:  Clear to  auscultation without rales, wheezing or rhonchi  ABDOMEN: Soft, non-tender, non-distended EXTREMITIES:  No edema; No deformity   ASSESSMENT AND PLAN:   Aortic insufficiency -  This was moderate on echo in May 2025.  I will follow this clinically and likely repeat an echocardiogram in May 2026.    Atrial fib-  [name] [name] has a CHA2DS2 - VASc score of 2.   She is status post ablation.  This was her second ablation.  She tolerates anticoagulation.  She has had no further symptomatic arrhythmias.  No change in therapy.   HTN - I did review her blood pressure diary.  Her blood pressure is running a little bit high.  She is going to continue the meds as listed which includes an increased beta-blocker.   Elevated coronary calcium  - She has had no new symptoms.  She is physically active.  She continues with risk reduction.  Elevated lipid - LDL was 59.  No change in therapy.  Aortic atherosclerosis - She is being managed with primary risk reduction.  Ascending aortic dilatation - This is 43mm in October 2024.  I will follow this up with a CT aorta in October of this year.  TR - I will follow this up with an echocardiogram in May  2026.   Preop - OK for colonoscopy.  She can hold 4 doses of her Eliquis .  No further testing is suggested.     Follow up with me in 6 months  Signed, Lynwood Schilling, MD

## 2024-04-29 ENCOUNTER — Other Ambulatory Visit

## 2024-04-29 ENCOUNTER — Encounter: Payer: Self-pay | Admitting: Cardiology

## 2024-04-29 ENCOUNTER — Ambulatory Visit: Attending: Cardiology | Admitting: Cardiology

## 2024-04-29 VITALS — BP 118/52 | HR 64 | Ht 64.0 in | Wt 99.0 lb

## 2024-04-29 DIAGNOSIS — Z961 Presence of intraocular lens: Secondary | ICD-10-CM | POA: Diagnosis not present

## 2024-04-29 DIAGNOSIS — H0102A Squamous blepharitis right eye, upper and lower eyelids: Secondary | ICD-10-CM | POA: Diagnosis not present

## 2024-04-29 DIAGNOSIS — H35371 Puckering of macula, right eye: Secondary | ICD-10-CM | POA: Diagnosis not present

## 2024-04-29 DIAGNOSIS — I480 Paroxysmal atrial fibrillation: Secondary | ICD-10-CM

## 2024-04-29 DIAGNOSIS — H353131 Nonexudative age-related macular degeneration, bilateral, early dry stage: Secondary | ICD-10-CM | POA: Diagnosis not present

## 2024-04-29 DIAGNOSIS — H0102B Squamous blepharitis left eye, upper and lower eyelids: Secondary | ICD-10-CM | POA: Diagnosis not present

## 2024-04-29 DIAGNOSIS — I10 Essential (primary) hypertension: Secondary | ICD-10-CM | POA: Diagnosis not present

## 2024-04-29 DIAGNOSIS — I351 Nonrheumatic aortic (valve) insufficiency: Secondary | ICD-10-CM | POA: Diagnosis not present

## 2024-04-29 DIAGNOSIS — I071 Rheumatic tricuspid insufficiency: Secondary | ICD-10-CM

## 2024-04-29 DIAGNOSIS — I77810 Thoracic aortic ectasia: Secondary | ICD-10-CM

## 2024-04-29 DIAGNOSIS — H04123 Dry eye syndrome of bilateral lacrimal glands: Secondary | ICD-10-CM | POA: Diagnosis not present

## 2024-04-29 NOTE — Patient Instructions (Addendum)
 Medication Instructions:  Your physician recommends that you continue on your current medications as directed. Please refer to the Current Medication list given to you today.  *If you need a refill on your cardiac medications before your next appointment, please call your pharmacy*  Lab Work: BMET, Magnesium  today If you have labs (blood work) drawn today and your tests are completely normal, you will receive your results only by: MyChart Message (if you have MyChart) OR A paper copy in the mail If you have any lab test that is abnormal or we need to change your treatment, we will call you to review the results.  Testing/Procedures: Echo in May Your physician has requested that you have an echocardiogram. Echocardiography is a painless test that uses sound waves to create images of your heart. It provides your doctor with information about the size and shape of your heart and how well your heart's chambers and valves are working. This procedure takes approximately one hour. There are no restrictions for this procedure. Please do NOT wear cologne, perfume, aftershave, or lotions (deodorant is allowed). Please arrive 15 minutes prior to your appointment time.  Please note: We ask at that you not bring children with you during ultrasound (echo/ vascular) testing. Due to room size and safety concerns, children are not allowed in the ultrasound rooms during exams. Our front office staff cannot provide observation of children in our lobby area while testing is being conducted. An adult accompanying a patient to their appointment will only be allowed in the ultrasound room at the discretion of the ultrasound technician under special circumstances. We apologize for any inconvenience.  CT of the Aorta in October  Follow-Up: At [facility], you and your health needs are our priority.  As part of our continuing mission to provide you with exceptional heart care, our providers are all part of  one team.  This team includes your primary Cardiologist (physician) and Advanced Practice Providers or APPs (Physician Assistants and Nurse Practitioners) who all work together to provide you with the care you need, when you need it.  Your next appointment:   6 month(s)  Provider:   Lynwood Schilling, MD    We recommend signing up for the patient portal called MyChart.  Sign up information is provided on this After Visit Summary.  MyChart is used to connect with patients for Virtual Visits (Telemedicine).  Patients are able to view lab/test results, encounter notes, upcoming appointments, etc.  Non-urgent messages can be sent to your provider as well.   To learn more about what you can do with MyChart, go to [email or web address].   Other Instructions Blood pressure diary:Take you blood pressure twice daily for 10 days and send us  the readings    Pre Op information is in Office Note from today

## 2024-04-30 ENCOUNTER — Encounter: Payer: Self-pay | Admitting: Internal Medicine

## 2024-04-30 ENCOUNTER — Ambulatory Visit: Payer: Self-pay | Admitting: Cardiology

## 2024-04-30 LAB — BASIC METABOLIC PANEL WITH GFR
BUN/Creatinine Ratio: 43 — ABNORMAL HIGH (ref 12–28)
BUN: 32 mg/dL — ABNORMAL HIGH (ref 8–27)
CO2: 23 mmol/L (ref 20–29)
Calcium: 10.7 mg/dL — ABNORMAL HIGH (ref 8.7–10.3)
Chloride: 105 mmol/L (ref 96–106)
Creatinine, Ser: 0.74 mg/dL (ref 0.57–1.00)
Glucose: 100 mg/dL — ABNORMAL HIGH (ref 70–99)
Potassium: 4.6 mmol/L (ref 3.5–5.2)
Sodium: 144 mmol/L (ref 134–144)
eGFR: 86 mL/min/{1.73_m2} (ref >59)

## 2024-04-30 LAB — MAGNESIUM: Magnesium: 2.2 mg/dL (ref 1.6–2.3)

## 2024-05-05 ENCOUNTER — Telehealth: Payer: Self-pay | Admitting: Physician Assistant

## 2024-05-05 NOTE — Telephone Encounter (Signed)
 Returned the patients call. PT reports that she is having a lingering cough following a call that she had a few weeks ago and was asking if she was ok to proceed with the procedure tomorrow. She has no fever, chills,night sweats etc. Also discussed her medications that she can take tomorrow prior to her procedure. She has been off of her Eliquis  as instructed. Told her to hold the AREDS as it is a red capsule. PT verbalized understanding.

## 2024-05-05 NOTE — Telephone Encounter (Signed)
 Patient called and stated that she would like to know what medication she is allowed to take and what medication she can not take before her procedure scheduled for tomorrow with Dr. Wilhelmenia. Patient also stated that two weeks ago she had a cold and is no longer having cold symptoms but she does have a mucus cough still around and was wondering if that would be a problem for her. Patient is requesting a call back. Please advise.

## 2024-05-06 ENCOUNTER — Ambulatory Visit: Admitting: Gastroenterology

## 2024-05-06 ENCOUNTER — Encounter: Admitting: Gastroenterology

## 2024-05-06 ENCOUNTER — Encounter: Payer: Self-pay | Admitting: Gastroenterology

## 2024-05-06 VITALS — BP 117/62 | HR 70 | Temp 97.4°F | Resp 19 | Ht 63.0 in | Wt 100.0 lb

## 2024-05-06 DIAGNOSIS — K5730 Diverticulosis of large intestine without perforation or abscess without bleeding: Secondary | ICD-10-CM | POA: Diagnosis not present

## 2024-05-06 DIAGNOSIS — D122 Benign neoplasm of ascending colon: Secondary | ICD-10-CM

## 2024-05-06 DIAGNOSIS — K6289 Other specified diseases of anus and rectum: Secondary | ICD-10-CM

## 2024-05-06 DIAGNOSIS — Z1211 Encounter for screening for malignant neoplasm of colon: Secondary | ICD-10-CM

## 2024-05-06 DIAGNOSIS — K621 Rectal polyp: Secondary | ICD-10-CM | POA: Diagnosis not present

## 2024-05-06 DIAGNOSIS — I480 Paroxysmal atrial fibrillation: Secondary | ICD-10-CM | POA: Diagnosis not present

## 2024-05-06 DIAGNOSIS — K644 Residual hemorrhoidal skin tags: Secondary | ICD-10-CM

## 2024-05-06 DIAGNOSIS — D120 Benign neoplasm of cecum: Secondary | ICD-10-CM | POA: Diagnosis not present

## 2024-05-06 DIAGNOSIS — D123 Benign neoplasm of transverse colon: Secondary | ICD-10-CM | POA: Diagnosis not present

## 2024-05-06 DIAGNOSIS — K635 Polyp of colon: Secondary | ICD-10-CM | POA: Diagnosis not present

## 2024-05-06 DIAGNOSIS — I10 Essential (primary) hypertension: Secondary | ICD-10-CM | POA: Diagnosis not present

## 2024-05-06 MED ORDER — SODIUM CHLORIDE 0.9 % IV SOLN: 500.0000 mL | Freq: Once | INTRAVENOUS | Status: DC

## 2024-05-06 NOTE — Op Note (Signed)
 [facility] Patient Name: [name] [name] Procedure Date: 05/06/2024 9:45 AM MRN: [id number] Endoscopist: Aloha Finner , MD, [id number] Age: 73 Referring MD:  Date of Birth: 1951/09/28 Gender: Female Account #: [id number] Procedure:                Colonoscopy Indications:              Screening for colorectal malignant neoplasm Medicines:                Monitored Anesthesia Care Procedure:                Pre-Anesthesia Assessment:                           - Prior to the procedure, a History and Physical                            was performed, and patient medications and                            allergies were reviewed. The patient's tolerance of                            previous anesthesia was also reviewed. The risks                            and benefits of the procedure and the sedation                            options and risks were discussed with the patient.                            All questions were answered, and informed consent                            was obtained. Prior Anticoagulants: The patient has                            taken Eliquis  (apixaban ), last dose was 2 days                            prior to procedure. ASA Grade Assessment: III - A                            patient with severe systemic disease. After                            reviewing the risks and benefits, the patient was                            deemed in satisfactory condition to undergo the                            procedure.  After obtaining informed consent, the colonoscope                            was passed under direct vision. Throughout the                            procedure, the patient's blood pressure, pulse, and                            oxygen saturations were monitored continuously. The                            Olympus Scope SN: [id number] was introduced through                            the anus and advanced to the 3 cm into  the ileum.                            The colonoscopy was somewhat difficult due to                            significant looping and a tortuous colon.                            Successful completion of the procedure was aided by                            changing the patient's position, using manual                            pressure, straightening and shortening the scope to                            obtain bowel loop reduction and using scope                            torsion. The patient tolerated the procedure. The                            quality of the bowel preparation was adequate. The                            terminal ileum, ileocecal valve, appendiceal                            orifice, and rectum were photographed. Scope In: 9:53:57 AM Scope Out: 10:16:19 AM Scope Withdrawal Time: 0 hours 16 minutes 41 seconds  Total Procedure Duration: 0 hours 22 minutes 22 seconds  Findings:                 Skin tags were found on perianal exam.                           The digital rectal exam findings include  hemorrhoids. Pertinent negatives include no                            palpable rectal lesions.                           The colon (entire examined portion) was                            significantly tortuous.                           The terminal ileum and ileocecal valve appeared                            normal.                           Six sessile polyps were found in the hepatic                            flexure (1), ascending colon (1) and cecum (4). The                            polyps were 2 to 15 mm in size. These polyps were                            removed with a cold snare. Resection and retrieval                            were complete.                           Normal mucosa was found in the entire colon                            otherwise.                           Multiple medium-mouthed and small-mouthed                             diverticula were found in the entire colon.                           A small scar was found in the distal rectum. The                            scar tissue was healthy in appearance - likely                            prior hemorrhoidal banding.                           Non-bleeding non-thrombosed external and internal  hemorrhoids were found during retroflexion, during                            perianal exam and during digital exam. The                            hemorrhoids were Grade II (internal hemorrhoids                            that prolapse but reduce spontaneously). Complications:            No immediate complications. Estimated Blood Loss:     Estimated blood loss was minimal. Impression:               - Perianal skin tags found on perianal exam.                            Hemorrhoids found on digital rectal exam.                           - Tortuous left colon.                           - The examined portion of the ileum was normal.                           - Six 2 to 15 mm polyps at the hepatic flexure, in                            the ascending colon and in the cecum, removed with                            a cold snare. Resected and retrieved.                           - Diverticulosis in the entire examined colon.                           - Normal mucosa in the entire examined colon.                           - Scar in the distal rectum - prior banding scar.                           - Non-bleeding non-thrombosed external and internal                            hemorrhoids. Recommendation:           - The patient will be observed post-procedure,                            until all discharge criteria are met.                           -  Discharge patient to home.                           - Patient has a contact number available for                            emergencies. The signs and symptoms of potential                             delayed complications were discussed with the                            patient. Return to normal activities tomorrow.                            Written discharge instructions were provided to the                            patient.                           - High fiber diet.                           - Use FiberCon 1-2 tablets PO daily.                           - May restart Eliquis  on 7/10 AM to decrease risk                            of post-interventional bleeding.                           - Continue present medications.                           - Await pathology results.                           - Repeat colonoscopy in 3 years for surveillance.                           - The findings and recommendations were discussed                            with the patient.                           - The findings and recommendations were discussed                            with the patient's family. Aloha Finner, MD 05/06/2024 10:25:14 AM

## 2024-05-06 NOTE — Progress Notes (Signed)
 GASTROENTEROLOGY PROCEDURE H&P NOTE   Primary Care Physician: Charlett Apolinar POUR, MD  HPI: [name] [name] is a 73 y.o. female who presents for Colon Cancer screening.  Past Medical History:  Diagnosis Date   Allergic rhinitis    Aortic insufficiency    CAT SCRATCH 09/10/2009   Qualifier: Diagnosis of  By: Charlett MD, Apolinar POUR    Clavicle fracture 12/2006   left   Diverticulosis    HTN (hypertension)    Osteopenia    Paroxysmal atrial fibrillation (HCC)    Pneumonia    PONV (postoperative nausea and vomiting)    Vision disturbance 03/13/2013   Negative neuro MRA MRI   Past Surgical History:  Procedure Laterality Date   ABDOMINAL HYSTERECTOMY     pt denies 04/15/14   APPENDECTOMY     ATRIAL FIBRILLATION ABLATION N/A 02/01/2021   Procedure: ATRIAL FIBRILLATION ABLATION;  Surgeon: Kelsie Agent, MD;  Location: [facility];  Service: Cardiovascular;  Laterality: N/A;   ATRIAL FIBRILLATION ABLATION N/A 09/03/2023   Procedure: ATRIAL FIBRILLATION ABLATION;  Surgeon: Inocencio Soyla Lunger, MD;  Location: [facility];  Service: Cardiovascular;  Laterality: N/A;   BRONCHIAL WASHINGS  09/05/2021   Procedure: BRONCHIAL WASHINGS;  Surgeon: Shelah Lamar RAMAN, MD;  Location: [facility];  Service: Cardiopulmonary;;   LAPAROSCOPIC SALPINGO OOPHERECTOMY Right    TONSILLECTOMY AND ADENOIDECTOMY     VIDEO BRONCHOSCOPY N/A 09/05/2021   Procedure: VIDEO BRONCHOSCOPY WITHOUT FLUORO;  Surgeon: Shelah Lamar RAMAN, MD;  Location: [facility] ENDOSCOPY;  Service: Cardiopulmonary;  Laterality: N/A;   Current Outpatient Medications  Medication Sig Dispense Refill   acetaminophen  (TYLENOL ) 500 MG tablet Take 325-500 mg by mouth as needed for moderate pain (pain score 4-6).     alendronate  (FOSAMAX ) 70 MG tablet TAKE 1 TABLET BY MOUTH WEEKLY  WITH A FULL GLASS OF WATER ON AN EMPTY STOMACH 12 tablet 3   apixaban  (ELIQUIS ) 5 MG TABS tablet TAKE 1 TABLET BY MOUTH TWICE  DAILY 200 tablet 1   Cholecalciferol  (VITAMIN D3) 1000 units CAPS Take 1,000 Units by mouth daily.     Coenzyme Q10 (COQ10) 100 MG CAPS Take 100 mg by mouth daily with supper.     diltiazem  (CARDIZEM ) 30 MG tablet TAKE 1 TABLET EVERY 4 HOURS AS NEEDED FOR BREAKTHROUGH AFIB 45 tablet 1   diltiazem  (TIAZAC ) 300 MG 24 hr capsule Take 1 capsule (300 mg total) by mouth daily. 5 capsule 0   dofetilide  (TIKOSYN ) 250 MCG capsule TAKE 1 CAPSULE BY MOUTH 2 TIMES DAILY. 180 capsule 2   LORazepam  (ATIVAN ) 0.5 MG tablet Take 1 tablet (0.5 mg total) by mouth daily as needed for anxiety (when flying). 20 tablet 0   metoprolol  tartrate (LOPRESSOR ) 50 MG tablet Take 1 tablet (50 mg total) by mouth 2 (two) times daily. 180 tablet 2   Multiple Vitamins-Minerals (PRESERVISION AREDS PO) Take 1 capsule by mouth in the morning and at bedtime.     potassium chloride  (KLOR-CON ) 10 MEQ tablet TAKE 2 TABLETS BY MOUTH DAILY 200 tablet 2   pravastatin  (PRAVACHOL ) 20 MG tablet TAKE 1 TABLET BY MOUTH IN THE  EVENING 100 tablet 2   No current facility-administered medications for this visit.    Current Outpatient Medications:    acetaminophen  (TYLENOL ) 500 MG tablet, Take 325-500 mg by mouth as needed for moderate pain (pain score 4-6)., Disp: , Rfl:    alendronate  (FOSAMAX ) 70 MG tablet, TAKE 1 TABLET BY MOUTH WEEKLY  WITH  A FULL GLASS OF WATER ON AN EMPTY STOMACH, Disp: 12 tablet, Rfl: 3   apixaban  (ELIQUIS ) 5 MG TABS tablet, TAKE 1 TABLET BY MOUTH TWICE  DAILY, Disp: 200 tablet, Rfl: 1   Cholecalciferol (VITAMIN D3) 1000 units CAPS, Take 1,000 Units by mouth daily., Disp: , Rfl:    Coenzyme Q10 (COQ10) 100 MG CAPS, Take 100 mg by mouth daily with supper., Disp: , Rfl:    diltiazem  (CARDIZEM ) 30 MG tablet, TAKE 1 TABLET EVERY 4 HOURS AS NEEDED FOR BREAKTHROUGH AFIB, Disp: 45 tablet, Rfl: 1   diltiazem  (TIAZAC ) 300 MG 24 hr capsule, Take 1 capsule (300 mg total) by mouth daily., Disp: 5 capsule, Rfl: 0   dofetilide  (TIKOSYN ) 250 MCG capsule, TAKE 1 CAPSULE BY  MOUTH 2 TIMES DAILY., Disp: 180 capsule, Rfl: 2   LORazepam  (ATIVAN ) 0.5 MG tablet, Take 1 tablet (0.5 mg total) by mouth daily as needed for anxiety (when flying)., Disp: 20 tablet, Rfl: 0   metoprolol  tartrate (LOPRESSOR ) 50 MG tablet, Take 1 tablet (50 mg total) by mouth 2 (two) times daily., Disp: 180 tablet, Rfl: 2   Multiple Vitamins-Minerals (PRESERVISION AREDS PO), Take 1 capsule by mouth in the morning and at bedtime., Disp: , Rfl:    potassium chloride  (KLOR-CON ) 10 MEQ tablet, TAKE 2 TABLETS BY MOUTH DAILY, Disp: 200 tablet, Rfl: 2   pravastatin  (PRAVACHOL ) 20 MG tablet, TAKE 1 TABLET BY MOUTH IN THE  EVENING, Disp: 100 tablet, Rfl: 2 Allergies  Allergen Reactions   Latex Hives   Other Rash and Other (See Comments)    Adhesive pads left on skin for extended periods = BURNS AND RASHES ON THE SKIN!!!!   Codeine Phosphate Other (See Comments)    Drowsiness    Demerol [Meperidine] Nausea And Vomiting   Protonix  [Pantoprazole ] Rash   Sulfa  Antibiotics Rash   Tens Therapy Replace Back Pads Itching and Rash   Family History  Problem Relation Age of Onset   Scleroderma Mother    Hyperlipidemia Mother    Coronary artery disease Father 29   Esophageal cancer Paternal Grandmother    Social History   Socioeconomic History   Marital status: Married    Spouse name: Not on file   Number of children: 0   Years of education: Not on file   Highest education level: Bachelor's degree (e.g., BA, AB, BS)  Occupational History   Occupation: Production manager   Occupation: REPORTER    Employer: [employer]  Tobacco Use   Smoking status: Never   Smokeless tobacco: Never   Tobacco comments:    Never smoked 06/28/23  Vaping Use   Vaping status: Never Used  Substance and Sexual Activity   Alcohol use: Not Currently   Drug use: No   Sexual activity: Not on file  Other Topics Concern   Not on file  Social History Narrative   Married   hh of 2    p0g0   Works for [employer]     Social Drivers of Corporate investment banker Strain: Low Risk  (01/01/2024)   Overall Financial Resource Strain (CARDIA)    Difficulty of Paying Living Expenses: Not hard at all  Food Insecurity: No Food Insecurity (01/01/2024)   Hunger Vital Sign    Worried About Running Out of Food in the Last Year: Never true    Ran Out of Food in the Last Year: Never true  Transportation Needs: No Transportation Needs (01/01/2024)   PRAPARE -  Administrator, Civil Service (Medical): No    Lack of Transportation (Non-Medical): No  Physical Activity: Sufficiently Active (01/01/2024)   Exercise Vital Sign    Days of Exercise per Week: 3 days    Minutes of Exercise per Session: 60 min  Recent Concern: Physical Activity - Insufficiently Active (12/26/2023)   Exercise Vital Sign    Days of Exercise per Week: 2 days    Minutes of Exercise per Session: 50 min  Stress: Stress Concern Present (01/01/2024)   [employer] of Occupational Health - Occupational Stress Questionnaire    Feeling of Stress : To some extent  Social Connections: Socially Integrated (01/01/2024)   Social Connection and Isolation Panel    Frequency of Communication with Friends and Family: More than three times a week    Frequency of Social Gatherings with Friends and Family: Twice a week    Attends Religious Services: 1 to 4 times per year    Active Member of [employer] or Organizations: Yes    Attends Engineer, structural: More than 4 times per year    Marital Status: Married  Catering manager Violence: Not At Risk (12/26/2023)   Humiliation, Afraid, Rape, and Kick questionnaire    Fear of Current or Ex-Partner: No    Emotionally Abused: No    Physically Abused: No    Sexually Abused: No    Physical Exam: There were no vitals filed for this visit. There is no height or weight on file to calculate BMI. GEN: NAD EYE: Sclerae anicteric ENT: MMM CV: Non-tachycardic GI: Soft, NT/ND NEURO:  Alert & Oriented x  3  Lab Results: No results for input(s): WBC, HGB, HCT, PLT in the last 72 hours. BMET No results for input(s): NA, K, CL, CO2, GLUCOSE, BUN, CREATININE, CALCIUM  in the last 72 hours. LFT No results for input(s): PROT, ALBUMIN, AST, ALT, ALKPHOS, BILITOT, BILIDIR, IBILI in the last 72 hours. PT/INR No results for input(s): LABPROT, INR in the last 72 hours.   Impression / Plan: This is a 73 y.o.female who presents for Colon Cancer screening.  The risks and benefits of endoscopic evaluation/treatment were discussed with the patient and/or family; these include but are not limited to the risk of perforation, infection, bleeding, missed lesions, lack of diagnosis, severe illness requiring hospitalization, as well as anesthesia and sedation related illnesses.  The patient's history has been reviewed, patient examined, no change in status, and deemed stable for procedure.  The patient and/or family is agreeable to proceed.    Aloha Finner, MD [facility] Advanced [facility]oscopy Office # [phone]

## 2024-05-06 NOTE — Progress Notes (Signed)
 Sedate, gd SR, tolerated procedure well, VSS, report to RN

## 2024-05-06 NOTE — Patient Instructions (Signed)

## 2024-05-06 NOTE — Progress Notes (Signed)
 Called to room to assist during endoscopic procedure.  Patient ID and intended procedure confirmed with present staff. Received instructions for my participation in the procedure from the performing physician.

## 2024-05-07 ENCOUNTER — Ambulatory Visit (INDEPENDENT_AMBULATORY_CARE_PROVIDER_SITE_OTHER): Admitting: Internal Medicine

## 2024-05-07 ENCOUNTER — Encounter: Payer: Self-pay | Admitting: Internal Medicine

## 2024-05-07 ENCOUNTER — Other Ambulatory Visit: Payer: Self-pay | Admitting: Internal Medicine

## 2024-05-07 ENCOUNTER — Encounter: Admitting: Gastroenterology

## 2024-05-07 ENCOUNTER — Telehealth: Payer: Self-pay

## 2024-05-07 VITALS — BP 104/58 | HR 67 | Temp 97.8°F | Ht 63.0 in | Wt 97.4 lb

## 2024-05-07 DIAGNOSIS — R799 Abnormal finding of blood chemistry, unspecified: Secondary | ICD-10-CM

## 2024-05-07 DIAGNOSIS — Z7901 Long term (current) use of anticoagulants: Secondary | ICD-10-CM

## 2024-05-07 DIAGNOSIS — Z0000 Encounter for general adult medical examination without abnormal findings: Secondary | ICD-10-CM | POA: Diagnosis not present

## 2024-05-07 DIAGNOSIS — R059 Cough, unspecified: Secondary | ICD-10-CM

## 2024-05-07 DIAGNOSIS — L989 Disorder of the skin and subcutaneous tissue, unspecified: Secondary | ICD-10-CM | POA: Diagnosis not present

## 2024-05-07 DIAGNOSIS — Z79899 Other long term (current) drug therapy: Secondary | ICD-10-CM

## 2024-05-07 NOTE — Telephone Encounter (Signed)
  Follow up Call-     05/06/2024    9:20 AM  Call back number  Post procedure Call Back phone  # [phone]  Permission to leave phone message Yes     Patient questions:  Do you have a fever, pain , or abdominal swelling? No. Pain Score  0 *  Have you tolerated food without any problems? Yes.    Have you been able to return to your normal activities? Yes.    Do you have any questions about your discharge instructions: Diet   No. Medications  No. Follow up visit  No.  Do you have questions or concerns about your Care? No.  Actions: * If pain score is 4 or above: No action needed, pain <4.  Pt doing fine right now, did call the on call number this morning because she was in a-fib and was able to talk to a physician. No questions or concerns at this moment.

## 2024-05-07 NOTE — Patient Instructions (Signed)
 Plan updated labs when hydrated   and at least a few weeks out from  the polyp removal . I will place orders for this I do  not think you have a kidney problem at this time. See dermatologist   as discussed .   Plan fu depending on results .

## 2024-05-07 NOTE — Progress Notes (Signed)
 Chief Complaint  Patient presents with   Annual Exam    Pt is here with spouse. She reports she had a colonscopy yesterday and off of eliquis  since Sunday, will start back on tomorrow morning. Pt reports she went into a fib this morning. Now resolved. Pt would like to talk about cough and lab, mold.     HPI: Patient  [name] [name]  73 y.o. comes in today for Preventive Health Care visit  here with spouse  Has a number of list concerns   Cards under care had a fib for a few hours today had colon yesterday and polyps  off eliquis  temp for now See labs per cards  bun up and down  no dx gi bleed  Was to have  upper endo  this summer Residual cough from rep infection about 2 weeks ago no fever sob Mole itching or tag scratching below left breast  .  Also r  back itchy for past months   No  med change  Health Maintenance  Topic Date Due   COVID-19 Vaccine (8 - Pfizer risk 2024-25 season) 05/23/2024 (Originally 01/30/2024)   DTaP/Tdap/Td (2 - Tdap) 02/05/2025 (Originally 05/21/2017)   INFLUENZA VACCINE  05/30/2024   Medicare Annual Wellness (AWV)  12/25/2024   MAMMOGRAM  08/15/2025   Colonoscopy  05/07/2027   Pneumococcal Vaccine: 50+ Years  Completed   DEXA SCAN  Completed   Hepatitis C Screening  Completed   Zoster Vaccines- Shingrix  Completed   Hepatitis B Vaccines  Aged Out   HPV VACCINES  Aged Out   Meningococcal B Vaccine  Aged Out   Health Maintenance Review LIFESTYLE:  Exercise:  2 x per week aerobics   some resistance  Tobacco/ETS: n Alcohol:  n Sugar beverages: n Sleep:  6.5-7  Drug use: no HH of  2 2 cats  To travel [place]   in September     ROS:  GEN/ HEENT: No fever, significant weight changes sweats headaches vision problems hearing changes, CV/ PULM; No chest pain shortness of breath cough, syncope,edema  change in exercise tolerance. GI /GU: No adominal pain, vomiting, change in bowel habits. No blood in the stool. No significant GU symptoms. SKIN/HEME: ,no  acute skin rashes suspicious lesions or bleeding. No lymphadenopathy, nodules, masses.  NEURO/ PSYCH:  No neurologic signs such as weakness numbness. No depression anxiety. IMM/ Allergy: No unusual infections.  Allergy .   REST of 12 system review negative except as per HPI   Past Medical History:  Diagnosis Date   Allergic rhinitis    Aortic insufficiency    CAT SCRATCH 09/10/2009   Qualifier: Diagnosis of  By: Charlett MD, Apolinar POUR    Clavicle fracture 12/2006   left   Diverticulosis    HTN (hypertension)    Osteopenia    Paroxysmal atrial fibrillation (HCC)    Pneumonia    PONV (postoperative nausea and vomiting)    Vision disturbance 03/13/2013   Negative neuro MRA MRI    Past Surgical History:  Procedure Laterality Date   ABDOMINAL HYSTERECTOMY     pt denies 04/15/14   APPENDECTOMY     ATRIAL FIBRILLATION ABLATION N/A 02/01/2021   Procedure: ATRIAL FIBRILLATION ABLATION;  Surgeon: Kelsie Agent, MD;  Location: [facility];  Service: Cardiovascular;  Laterality: N/A;   ATRIAL FIBRILLATION ABLATION N/A 09/03/2023   Procedure: ATRIAL FIBRILLATION ABLATION;  Surgeon: Inocencio Soyla Lunger, MD;  Location: [facility];  Service: Cardiovascular;  Laterality:  N/A;   BRONCHIAL WASHINGS  09/05/2021   Procedure: BRONCHIAL WASHINGS;  Surgeon: Shelah Lamar RAMAN, MD;  Location: [facility] ENDOSCOPY;  Service: Cardiopulmonary;;   LAPAROSCOPIC SALPINGO OOPHERECTOMY Right    TONSILLECTOMY AND ADENOIDECTOMY     VIDEO BRONCHOSCOPY N/A 09/05/2021   Procedure: VIDEO BRONCHOSCOPY WITHOUT FLUORO;  Surgeon: Shelah Lamar RAMAN, MD;  Location: [facility] ENDOSCOPY;  Service: Cardiopulmonary;  Laterality: N/A;    Family History  Problem Relation Age of Onset   Scleroderma Mother    Hyperlipidemia Mother    Coronary artery disease Father 38   Esophageal cancer Paternal Grandmother     Social History   Socioeconomic History   Marital status: Married    Spouse name: Not on file   Number of children: 0    Years of education: Not on file   Highest education level: Bachelor's degree (e.g., BA, AB, BS)  Occupational History   Occupation: Production manager   Occupation: REPORTER    Employer: [employer]  Tobacco Use   Smoking status: Never   Smokeless tobacco: Never   Tobacco comments:    Never smoked 06/28/23  Vaping Use   Vaping status: Never Used  Substance and Sexual Activity   Alcohol use: Not Currently   Drug use: No   Sexual activity: Not on file  Other Topics Concern   Not on file  Social History Narrative   Married   hh of 2    p0g0   Works for [employer]    Social Drivers of Corporate investment banker Strain: Low Risk  (01/01/2024)   Overall Financial Resource Strain (CARDIA)    Difficulty of Paying Living Expenses: Not hard at all  Food Insecurity: No Food Insecurity (01/01/2024)   Hunger Vital Sign    Worried About Running Out of Food in the Last Year: Never true    Ran Out of Food in the Last Year: Never true  Transportation Needs: No Transportation Needs (01/01/2024)   PRAPARE - Administrator, Civil Service (Medical): No    Lack of Transportation (Non-Medical): No  Physical Activity: Sufficiently Active (01/01/2024)   Exercise Vital Sign    Days of Exercise per Week: 3 days    Minutes of Exercise per Session: 60 min  Recent Concern: Physical Activity - Insufficiently Active (12/26/2023)   Exercise Vital Sign    Days of Exercise per Week: 2 days    Minutes of Exercise per Session: 50 min  Stress: Stress Concern Present (01/01/2024)   [employer] of Occupational Health - Occupational Stress Questionnaire    Feeling of Stress : To some extent  Social Connections: Socially Integrated (01/01/2024)   Social Connection and Isolation Panel    Frequency of Communication with Friends and Family: More than three times a week    Frequency of Social Gatherings with Friends and Family: Twice a week    Attends Religious Services: 1 to 4 times per year     Active Member of [employer] or Organizations: Yes    Attends Engineer, structural: More than 4 times per year    Marital Status: Married    Outpatient Medications Prior to Visit  Medication Sig Dispense Refill   acetaminophen  (TYLENOL ) 500 MG tablet Take 325-500 mg by mouth as needed for moderate pain (pain score 4-6).     alendronate  (FOSAMAX ) 70 MG tablet TAKE 1 TABLET BY MOUTH WEEKLY  WITH A FULL GLASS OF WATER ON AN EMPTY STOMACH 12 tablet 3  Cholecalciferol (VITAMIN D3) 1000 units CAPS Take 1,000 Units by mouth daily.     Coenzyme Q10 (COQ10) 100 MG CAPS Take 100 mg by mouth daily with supper.     diltiazem  (CARDIZEM ) 30 MG tablet TAKE 1 TABLET EVERY 4 HOURS AS NEEDED FOR BREAKTHROUGH AFIB 45 tablet 1   diltiazem  (TIAZAC ) 300 MG 24 hr capsule Take 1 capsule (300 mg total) by mouth daily. 5 capsule 0   dofetilide  (TIKOSYN ) 250 MCG capsule TAKE 1 CAPSULE BY MOUTH 2 TIMES DAILY. 180 capsule 2   metoprolol  tartrate (LOPRESSOR ) 50 MG tablet Take 1 tablet (50 mg total) by mouth 2 (two) times daily. 180 tablet 2   Multiple Vitamins-Minerals (PRESERVISION AREDS PO) Take 1 capsule by mouth in the morning and at bedtime.     potassium chloride  (KLOR-CON ) 10 MEQ tablet TAKE 2 TABLETS BY MOUTH DAILY 200 tablet 2   pravastatin  (PRAVACHOL ) 20 MG tablet TAKE 1 TABLET BY MOUTH IN THE  EVENING 100 tablet 2   apixaban  (ELIQUIS ) 5 MG TABS tablet TAKE 1 TABLET BY MOUTH TWICE  DAILY 200 tablet 1   LORazepam  (ATIVAN ) 0.5 MG tablet Take 1 tablet (0.5 mg total) by mouth daily as needed for anxiety (when flying). (Patient not taking: Reported on 05/07/2024) 20 tablet 0   No facility-administered medications prior to visit.     EXAM:  BP (!) 104/58 (BP Location: Left Arm, Patient Position: Sitting, Cuff Size: Normal)   Pulse 67   Temp 97.8 F (36.6 C)   Ht 5' 3 (1.6 m)   Wt 97 lb 6.4 oz (44.2 kg)   SpO2 95%   BMI 17.25 kg/m   Body mass index is 17.25 kg/m. Wt Readings from Last 3 Encounters:   05/07/24 97 lb 6.4 oz (44.2 kg)  05/06/24 100 lb (45.4 kg)  04/29/24 99 lb (44.9 kg)    Physical Exam: Vital signs reviewed HZW:Uypd is a well-developed well-nourished alert cooperative    who appearsr stated age in no acute distress.  HEENT: normocephalic atraumatic , Eyes: PERRL EOM's full, conjunctiva clear, Nares: paten,t no deformity discharge or tenderness., Ears: no deformity EAC's clear TMs with normal landmarks. Mouth: clear OP, no lesions, edema.  Moist mucous membranes. Dentition in adequate repair. NECK: supple without masses, thyromegaly or bruits. CHEST/PULM:  Clear to auscultation and percussion breath sounds equal no wheeze , rales or rhonchi. No chest wall deformities or tenderness. Breast: normal by inspection . No dimpling, discharge, masses, tenderness or discharge . CV: PMI is nondisplaced, S1 S2 no gallops, whoosing m supine  2/6  rubs. Peripheral pulses are full without delay.No JVD .  Some vv  ABDOMEN: Bowel sounds normal nontender  No guard or rebound, no hepato splenomegal no CVA tenderness.  Extremtities:  No clubbing cyanosis or edema, no acute joint swelling or redness no focal atrophy bunions  NEURO:  Oriented x3, cranial nerves 3-12 appear to be intact, no obvious focal weakness,gait within normal limits no abnormal reflexes or asymmetrical SKIN: No acute rashes normal turgor, color, no bruising or petechiae. Irritated fleshy 3 mm under left breast   , 2 mm scaly irrritated   lesion with pink base PSYCH: Oriented, good eye contact, no obvious depression anxiety, cognition and judgment appear normal. LN: no cervical axillary  adenopathy  Lab Results  Component Value Date   WBC 4.6 03/13/2024   HGB 14.0 03/13/2024   HCT 42.0 03/13/2024   PLT 194.0 03/13/2024   GLUCOSE 100 (H) 04/29/2024   CHOL  146 03/13/2024   TRIG 75.0 03/13/2024   HDL 72.60 03/13/2024   LDLDIRECT 131.0 09/03/2009   LDLCALC 59 03/13/2024   ALT 34 03/13/2024   AST 31 03/13/2024   NA  144 04/29/2024   K 4.6 04/29/2024   CL 105 04/29/2024   CREATININE 0.74 04/29/2024   BUN 32 (H) 04/29/2024   CO2 23 04/29/2024   TSH 1.24 03/13/2024    BP Readings from Last 3 Encounters:  05/07/24 (!) 104/58  05/06/24 117/62  04/29/24 (!) 118/52    Lab results reviewed with patient   ASSESSMENT AND PLAN:  Discussed the following assessment and plan:    ICD-10-CM   1. Visit for preventive health examination  Z00.00     2. Medication management  Z79.899     3. Elevated BUN  R79.9     4. Cough, unspecified type  R05.9    appears post  infectious reassuring exam  follow    5. Chronic anticoagulation  Z79.01     6. Serum calcium  elevated  E83.52     7. Skin lesion  L98.9     Elevated bun with nl gfr and c cystatin and last tests with mild calcium  of 10.7 ( per cards team)  S/p colon today would wait a few weeks and repeat  Under gi eval screening Skin areas  back area irritated needs follow up  since she has a derm have them check in fu.   List of concerns addressed  today as best possible. Return for depending on results and routine.  Patient Care Team: Taydem Cavagnaro, Apolinar POUR, MD as PCP - General (Internal Medicine) [name], MD as PCP - Cardiology (Cardiology) Inocencio Soyla Lunger, MD as PCP - Electrophysiology (Cardiology) Octavia Charleston, MD (Ophthalmology) [name], MD as Consulting Physician (Cardiology) [name], [facility] (Pharmacist) Patient Instructions  Plan updated labs when hydrated   and at least a few weeks out from  the polyp removal . I will place orders for this I do  not think you have a kidney problem at this time. See dermatologist   as discussed .   Plan fu depending on results .     Eon Zunker K. Brittie Whisnant M.D.

## 2024-05-08 ENCOUNTER — Encounter: Payer: Self-pay | Admitting: Gastroenterology

## 2024-05-08 ENCOUNTER — Other Ambulatory Visit

## 2024-05-08 LAB — SURGICAL PATHOLOGY

## 2024-05-09 ENCOUNTER — Encounter: Payer: Self-pay | Admitting: Internal Medicine

## 2024-05-10 NOTE — Telephone Encounter (Signed)
 Addressed at  visit July 9

## 2024-05-11 ENCOUNTER — Ambulatory Visit: Payer: Self-pay | Admitting: Gastroenterology

## 2024-05-11 NOTE — Progress Notes (Deleted)
 Cardiology Office Note:  .   Date:  05/11/2024  ID:  [name] [name], DOB 1951-01-03, MRN [id number] PCP: Charlett Apolinar POUR, MD  [facility] Providers Cardiologist:  Lynwood Schilling, MD Electrophysiologist:  Will Gladis Norton, MD {  History of Present Illness: SABRA   JERNIE SCHUTT is a 73 y.o. female w/PMHx of coronary Ca++ on CT (neg stress myoview 2018), HLD, HTN, VHD (w/AI), Afib  He last saw Dr. Norton in the office Oct 2024, recurrent Afib, symptomatic with them despite AAD and planned for re-do ablation  AFib ablation 09/03/23  Afib clinic visit 10/01/23, no symptoms of AFib, no procedural complications/concerns, no changes made  I saw her 12/05/23, doing well, stable QTc, no changes made  She saw Dr. Michele 02/15/24, work in DOD visit with recurrent AFib lasting several hours, associated L chest/back pain, metoprolol  dose increased   Saw the AFib clinic team 02/20/24, doing better on the increased BB, a little more fatigued, but no AFib.  No changes made  She saw Dr. Schilling 04/29/24, exercising, doing better, no AFib.  Planned for echo in 2026, no med changes. Was pending colonoscopy > felt a reasonable risk/candidate  Today's visit is scheduled is a 3 mo f/u ROS:   ***tikosyn  EKG, meds, labs *** burden *** symptoms  *** eliquis , dose, bleeding, labs  Arrhythmia/AAD hx Flecainide  2022 02/01/21: AFib ablation, CTI ablation as well NOTED: Multiple atypical atrial flutter circuits observed  Flecainide  stopped July 2022 w/return of AFib/flutter Dr. Kelsie Tikosyn  started Aug 2022 09/23/23: AFib ablation (PFA) Dr. Norton   Studies Reviewed: SABRA    EKG done today and reviewed by myself:  ***   09/03/23: EPS/ablation CONCLUSIONS: 1. Sinus rhythm upon presentation.   2. Successful electrical isolation and anatomical encircling of all four pulmonary veins with pulse field ablation. 3. Ablation of posterior wall with pulse field ablation 4. No early apparent  complications.  04/03/23: TTE 1. Left ventricular ejection fraction, by estimation, is 60 to 65%. The  left ventricle has normal function. The left ventricle has no regional  wall motion abnormalities. Left ventricular diastolic parameters are  indeterminate.   2. Right ventricular systolic function is normal. The right ventricular  size is normal.   3. Left atrial size was mildly dilated.   4. The mitral valve is normal in structure. Trivial mitral valve  regurgitation.   5. The aortic valve is tricuspid. Aortic valve regurgitation is moderate.  Aortic valve sclerosis is present, with no evidence of aortic valve  stenosis.   6. There is mild dilatation of the ascending aorta, measuring 42 mm.   7. The inferior vena cava is normal in size with greater than 50%  respiratory variability, suggesting right atrial pressure of 3 mmHg.    02/01/2021: EPS/ablation POSTPROCEDURE DIAGNOSES: 1. Paroxysmal atrial fibrillation. 2. Isthmus dependant right atrial flutter 3. Multiple atypical atrial flutter circuits  CONCLUSIONS: 1. Sinus rhythm upon presentation.   2. Intracardiac echo reveals a moderate sized left atrium. 3. Successful electrical isolation and anatomical encircling of all four pulmonary veins with radiofrequency current. 4. Additional mapping and ablation within the left atrium due to persistence of atrial fibrillation with a posterior wall box demonstrated  5. Cavo-tricuspid isthmus ablation performed 6. Multiple atypical atrial flutter circuits observed 7. Cardioversion performed 8. No early apparent complications.   Risk Assessment/Calculations:    Physical Exam:   VS:  There were no vitals taken for this visit.   Wt Readings from Last 3 Encounters:  05/07/24 97 lb 6.4 oz (44.2 kg)  05/06/24 100 lb (45.4 kg)  04/29/24 99 lb (44.9 kg)    GEN: Well nourished, well developed in no acute distress NECK: No JVD; No carotid bruits CARDIAC: *** RRR, no murmurs, rubs,  gallops RESPIRATORY: *** CTA b/l without rales, wheezing or rhonchi  ABDOMEN: Soft, non-tender, non-distended EXTREMITIES: *** No edema; No deformity    ASSESSMENT AND PLAN: .    paroxysmal AFib CHA2DS2Vasc is 3, on Eliquis , *** appropriately dosed Tikosyn  w/*** stable QTc *** burden by symptoms  *** Tikosyn  teaching re-enforced *** Med list looks OK  Follow burden of palpitations no past 3 mo mark Continue meds  HTN *** Looks good  Secondary hypercoagulable state 2/2 AFib       Dispo: back in *** 3 mo, sooner if needed  Signed, Charlies Macario Arthur, PA-C

## 2024-05-12 ENCOUNTER — Ambulatory Visit: Admitting: Physician Assistant

## 2024-05-15 DIAGNOSIS — Z681 Body mass index (BMI) 19 or less, adult: Secondary | ICD-10-CM | POA: Diagnosis not present

## 2024-05-15 NOTE — Telephone Encounter (Signed)
 Tessalon  is safe for patient to use, no interactions with her medications. Thank you!

## 2024-05-15 NOTE — Progress Notes (Unsigned)
  Electrophysiology Office Note:   Date:  05/16/2024  ID:  [name] [name], DOB [birth year], MRN [id number]  Primary Cardiologist: Lynwood Schilling, MD  Cardiology APP:  Nellene Quita [place], [place]  Electrophysiologist:  Will Gladis Norton, MD       History of Present Illness:   [name] [name] is a 73 y.o. female with h/o PAF/AFL s/p multiple ablations as below, HTN, HLD, and coronary calcifications seen today for routine electrophysiology followup.   Since last being seen in our clinic the patient reports doing well.  She had a colonoscopy about 2 weeks ago and had some AF around that time. Occasionally takes her prn diltiazem . Otherwise,  she denies chest pain, dyspnea, PND, orthopnea, nausea, vomiting, dizziness, syncope, edema, weight gain, or early satiety.   Review of systems complete and found to be negative unless listed in HPI.   EP Information / Studies Reviewed:    EKG is ordered today. Personal review as below.  EKG Interpretation Date/Time:  Friday May 16 2024 08:12:41 EDT Ventricular Rate:  57 PR Interval:  172 QRS Duration:  92 QT Interval:  476 QTC Calculation: 463 R Axis:   88  Text Interpretation: Sinus bradycardia Left ventricular hypertrophy with repolarization abnormality ( Sokolow-Lyon , Romhilt-Estes ) When compared with ECG of 20-Feb-2024 08:47, No significant change was found Confirmed by Lesia Sharper [phone]) on 05/16/2024 8:20:32 AM    Arrhythmia/Device History S/p AF and CTI ablation 01/2021 S/p re-do PVI 08/2023 (PFA)    Physical Exam:   VS:  BP (!) 140/78 (BP Location: Right Arm, Patient Position: Sitting, Cuff Size: Normal)   Pulse (!) 57   Ht 5' 3 (1.6 m)   Wt 99 lb 1.6 oz (45 kg)   SpO2 93%   BMI 17.55 kg/m    Wt Readings from Last 3 Encounters:  05/16/24 99 lb 1.6 oz (45 kg)  05/07/24 97 lb 6.4 oz (44.2 kg)  05/06/24 100 lb (45.4 kg)     GEN: No acute distress NECK: No JVD; No carotid bruits CARDIAC: Regular rate and rhythm, no murmurs, rubs,  gallops RESPIRATORY:  Clear to auscultation without rales, wheezing or rhonchi  ABDOMEN: Soft, non-tender, non-distended EXTREMITIES:  No edema; No deformity   ASSESSMENT AND PLAN:    Persistent AF EKG today shows NSR with stable intervals Continue eliquis  5 mg BID for CHA2DS2VASc of at least 3 Continue Tikosyn  250 mcg BID Labs stable 7/1.  HTN Stable on current regimen   Secondary hypercoagulable state Pt on Eliquis  as above    Follow up with Afib Clinic in 6 months  Signed, Sharper Prentice Lesia, PA-C

## 2024-05-15 NOTE — Telephone Encounter (Signed)
 Plain mucinex is ok but none of this is currative . Consider  x ray other eval if fever  new shortness of breath   Or note improving over the next 1-2 weeks. Exam was ok when you came to see me  K ask angela or  cards pharmacist if can take tessalon  perles  short term for comfort

## 2024-05-16 ENCOUNTER — Encounter: Payer: Self-pay | Admitting: Cardiology

## 2024-05-16 ENCOUNTER — Ambulatory Visit: Attending: Student | Admitting: Student

## 2024-05-16 ENCOUNTER — Encounter: Payer: Self-pay | Admitting: Student

## 2024-05-16 VITALS — BP 140/78 | HR 57 | Ht 63.0 in | Wt 99.1 lb

## 2024-05-16 DIAGNOSIS — I4819 Other persistent atrial fibrillation: Secondary | ICD-10-CM

## 2024-05-16 DIAGNOSIS — I10 Essential (primary) hypertension: Secondary | ICD-10-CM | POA: Diagnosis not present

## 2024-05-16 DIAGNOSIS — D6869 Other thrombophilia: Secondary | ICD-10-CM

## 2024-05-16 NOTE — Patient Instructions (Signed)
 Medication Instructions:  Your physician recommends that you continue on your current medications as directed. Please refer to the Current Medication list given to you today.  *If you need a refill on your cardiac medications before your next appointment, please call your pharmacy*  Lab Work: None ordered If you have labs (blood work) drawn today and your tests are completely normal, you will receive your results only by: MyChart Message (if you have MyChart) OR A paper copy in the mail If you have any lab test that is abnormal or we need to change your treatment, we will call you to review the results.  Follow-Up: At [facility], you and your health needs are our priority.  As part of our continuing mission to provide you with exceptional heart care, our providers are all part of one team.  This team includes your primary Cardiologist (physician) and Advanced Practice Providers or APPs (Physician Assistants and Nurse Practitioners) who all work together to provide you with the care you need, when you need it.  Your next appointment:   6 month(s)  Provider:   You will follow up in the Atrial Fibrillation Clinic located at [facility]. Your provider will be: [name]. [name], PA-C or Fairy Heinrich, PA-C

## 2024-05-19 ENCOUNTER — Other Ambulatory Visit: Payer: Self-pay | Admitting: Internal Medicine

## 2024-05-19 ENCOUNTER — Telehealth: Payer: Self-pay | Admitting: Nutrition

## 2024-05-19 DIAGNOSIS — L820 Inflamed seborrheic keratosis: Secondary | ICD-10-CM | POA: Diagnosis not present

## 2024-05-19 DIAGNOSIS — L918 Other hypertrophic disorders of the skin: Secondary | ICD-10-CM | POA: Diagnosis not present

## 2024-05-19 DIAGNOSIS — D492 Neoplasm of unspecified behavior of bone, soft tissue, and skin: Secondary | ICD-10-CM | POA: Diagnosis not present

## 2024-05-19 DIAGNOSIS — L538 Other specified erythematous conditions: Secondary | ICD-10-CM | POA: Diagnosis not present

## 2024-05-19 DIAGNOSIS — M810 Age-related osteoporosis without current pathological fracture: Secondary | ICD-10-CM

## 2024-05-19 NOTE — Telephone Encounter (Signed)
 Message left on my machine that she has an appointment with Dr. Trixie on Dec.1st. and wants to know if she also has a bone density appointment at that time.  She thinks it is time. Please call her.

## 2024-05-19 NOTE — Telephone Encounter (Signed)
 I called and spoke with the patient and spoke with Dr. Trixie: she states that the patient can have the Dexa q 2 years but it does not have to be done before she sees Dr. Trixie.

## 2024-05-27 ENCOUNTER — Telehealth: Payer: Self-pay

## 2024-05-27 NOTE — Telephone Encounter (Signed)
 Left voice message for pt (per DPR) with Dr. Denver suggestions on the blood pressure readings the pt dropped off. No changes to meds. Pt told to call should she have any questions. BP readings uploaded to chart.

## 2024-05-28 ENCOUNTER — Telehealth: Payer: Self-pay | Admitting: Gastroenterology

## 2024-05-28 NOTE — Telephone Encounter (Signed)
 Procedure:Upper EUS Procedure date: 06/05/24 Procedure location: [facility] Arrival Time: 1:30 pm Spoke with the patient Y/N: Yes Any prep concerns? No  Has the patient obtained the prep from the pharmacy ? No prep needed Do you have a care partner and transportation: Yes Any additional concerns? Pt had concerns about the insurance and the amount she has to pay. I explained to the patient the difference between the Endoscopy and the Upper Endoscopic Ultrasound. I told her that someone from  the hospital will be calling her and she can ask them, maybe they could explain I better since it's done there

## 2024-05-29 ENCOUNTER — Encounter (HOSPITAL_COMMUNITY): Payer: Self-pay | Admitting: Gastroenterology

## 2024-06-05 ENCOUNTER — Ambulatory Visit (HOSPITAL_COMMUNITY): Admitting: Anesthesiology

## 2024-06-05 ENCOUNTER — Encounter (HOSPITAL_COMMUNITY)
Admission: RE | Disposition: A | Discharge status: Home / Self Care | Payer: Self-pay | Source: Home / Self Care | Attending: Gastroenterology

## 2024-06-05 ENCOUNTER — Other Ambulatory Visit: Payer: Self-pay

## 2024-06-05 ENCOUNTER — Ambulatory Visit (HOSPITAL_COMMUNITY)
Admission: RE | Admit: 2024-06-05 | Discharge: 2024-06-05 | Disposition: A | Discharge status: Home / Self Care | Attending: Gastroenterology | Admitting: Gastroenterology

## 2024-06-05 ENCOUNTER — Encounter (HOSPITAL_COMMUNITY): Payer: Self-pay | Admitting: Gastroenterology

## 2024-06-05 DIAGNOSIS — K838 Other specified diseases of biliary tract: Secondary | ICD-10-CM

## 2024-06-05 DIAGNOSIS — I480 Paroxysmal atrial fibrillation: Secondary | ICD-10-CM | POA: Insufficient documentation

## 2024-06-05 DIAGNOSIS — Z7901 Long term (current) use of anticoagulants: Secondary | ICD-10-CM | POA: Diagnosis not present

## 2024-06-05 DIAGNOSIS — K831 Obstruction of bile duct: Secondary | ICD-10-CM | POA: Diagnosis not present

## 2024-06-05 DIAGNOSIS — R053 Chronic cough: Secondary | ICD-10-CM | POA: Diagnosis not present

## 2024-06-05 DIAGNOSIS — K862 Cyst of pancreas: Secondary | ICD-10-CM | POA: Diagnosis not present

## 2024-06-05 DIAGNOSIS — K31A15 Gastric intestinal metaplasia without dysplasia, involving multiple sites: Secondary | ICD-10-CM

## 2024-06-05 DIAGNOSIS — K449 Diaphragmatic hernia without obstruction or gangrene: Secondary | ICD-10-CM

## 2024-06-05 DIAGNOSIS — K5710 Diverticulosis of small intestine without perforation or abscess without bleeding: Secondary | ICD-10-CM

## 2024-06-05 DIAGNOSIS — K3189 Other diseases of stomach and duodenum: Secondary | ICD-10-CM | POA: Diagnosis not present

## 2024-06-05 DIAGNOSIS — I10 Essential (primary) hypertension: Secondary | ICD-10-CM | POA: Diagnosis not present

## 2024-06-05 DIAGNOSIS — I2510 Atherosclerotic heart disease of native coronary artery without angina pectoris: Secondary | ICD-10-CM

## 2024-06-05 DIAGNOSIS — K259 Gastric ulcer, unspecified as acute or chronic, without hemorrhage or perforation: Secondary | ICD-10-CM

## 2024-06-05 DIAGNOSIS — I082 Rheumatic disorders of both aortic and tricuspid valves: Secondary | ICD-10-CM | POA: Insufficient documentation

## 2024-06-05 DIAGNOSIS — I899 Noninfective disorder of lymphatic vessels and lymph nodes, unspecified: Secondary | ICD-10-CM

## 2024-06-05 DIAGNOSIS — K2970 Gastritis, unspecified, without bleeding: Secondary | ICD-10-CM

## 2024-06-05 HISTORY — PX: EUS: SHX5427

## 2024-06-05 HISTORY — PX: ESOPHAGOGASTRODUODENOSCOPY: SHX5428

## 2024-06-05 SURGERY — EGD (ESOPHAGOGASTRODUODENOSCOPY)
Anesthesia: General

## 2024-06-05 MED ORDER — LIDOCAINE 2% (20 MG/ML) 5 ML SYRINGE
INTRAMUSCULAR | Status: DC | PRN
  Administered 2024-06-05: 60 mg via INTRAVENOUS

## 2024-06-05 MED ORDER — LACTATED RINGERS IV SOLN
INTRAVENOUS | Status: DC | PRN
Start: 2024-06-05 — End: 2024-06-05

## 2024-06-05 MED ORDER — PROPOFOL 1000 MG/100ML IV EMUL
INTRAVENOUS | Status: AC
  Filled 2024-06-05: qty 100

## 2024-06-05 MED ORDER — PROPOFOL 500 MG/50ML IV EMUL
INTRAVENOUS | Status: DC | PRN
  Administered 2024-06-05: 125 ug/kg/min via INTRAVENOUS

## 2024-06-05 MED ORDER — PROPOFOL 10 MG/ML IV BOLUS
INTRAVENOUS | Status: AC
  Filled 2024-06-05: qty 20

## 2024-06-05 MED ORDER — ONDANSETRON HCL 4 MG/2ML IJ SOLN
INTRAMUSCULAR | Status: DC | PRN
  Administered 2024-06-05: 4 mg via INTRAVENOUS

## 2024-06-05 MED ORDER — PROPOFOL 10 MG/ML IV BOLUS
INTRAVENOUS | Status: DC | PRN
  Administered 2024-06-05: 80 mg via INTRAVENOUS
  Administered 2024-06-05: 40 mg via INTRAVENOUS

## 2024-06-05 MED ORDER — EPHEDRINE SULFATE-NACL 50-0.9 MG/10ML-% IV SOSY
PREFILLED_SYRINGE | INTRAVENOUS | Status: DC | PRN
  Administered 2024-06-05 (×2): 10 mg via INTRAVENOUS

## 2024-06-05 MED ORDER — ESOMEPRAZOLE MAGNESIUM 40 MG PO CPDR: 40.0000 mg | DELAYED_RELEASE_CAPSULE | Freq: Two times a day (BID) | ORAL | 12 refills | Status: DC

## 2024-06-05 NOTE — H&P (Signed)
 GASTROENTEROLOGY PROCEDURE H&P NOTE   Primary Care Physician: Charlett Apolinar POUR, MD  HPI: [name] [name] is a 73 y.o. female who presents for EGD/EUS to evaluate chronic cough and dilated bile duct.  Past Medical History:  Diagnosis Date   Allergic rhinitis    Aortic insufficiency    CAT SCRATCH 09/10/2009   Qualifier: Diagnosis of  By: Charlett MD, Apolinar POUR    Clavicle fracture 12/2006   left   Diverticulosis    HTN (hypertension)    Osteopenia    Paroxysmal atrial fibrillation (HCC)    Pneumonia    PONV (postoperative nausea and vomiting)    Vision disturbance 03/13/2013   Negative neuro MRA MRI   Past Surgical History:  Procedure Laterality Date   ABDOMINAL HYSTERECTOMY     pt denies 04/15/14   APPENDECTOMY     ATRIAL FIBRILLATION ABLATION N/A 02/01/2021   Procedure: ATRIAL FIBRILLATION ABLATION;  Surgeon: Kelsie Agent, MD;  Location: [facility] INVASIVE CV LAB;  Service: Cardiovascular;  Laterality: N/A;   ATRIAL FIBRILLATION ABLATION N/A 09/03/2023   Procedure: ATRIAL FIBRILLATION ABLATION;  Surgeon: Inocencio Soyla Lunger, MD;  Location: [facility] INVASIVE CV LAB;  Service: Cardiovascular;  Laterality: N/A;   BRONCHIAL WASHINGS  09/05/2021   Procedure: BRONCHIAL WASHINGS;  Surgeon: Shelah Lamar RAMAN, MD;  Location: [facility] ENDOSCOPY;  Service: Cardiopulmonary;;   LAPAROSCOPIC SALPINGO OOPHERECTOMY Right    TONSILLECTOMY AND ADENOIDECTOMY     VIDEO BRONCHOSCOPY N/A 09/05/2021   Procedure: VIDEO BRONCHOSCOPY WITHOUT FLUORO;  Surgeon: Shelah Lamar RAMAN, MD;  Location: [facility] ENDOSCOPY;  Service: Cardiopulmonary;  Laterality: N/A;   No current facility-administered medications for this encounter.   No current facility-administered medications for this encounter. Allergies  Allergen Reactions   Latex Hives   Other Rash and Other (See Comments)    Adhesive pads left on skin for extended periods = BURNS AND RASHES ON THE SKIN!!!!   Codeine Phosphate Other (See Comments)    Drowsiness    Demerol  [Meperidine] Nausea And Vomiting   Protonix  [Pantoprazole ] Rash   Sulfa  Antibiotics Rash   Tens Therapy Replace Back Pads Itching and Rash   Family History  Problem Relation Age of Onset   Scleroderma Mother    Hyperlipidemia Mother    Coronary artery disease Father 60   Esophageal cancer Paternal Grandmother    Social History   Socioeconomic History   Marital status: Married    Spouse name: Not on file   Number of children: 0   Years of education: Not on file   Highest education level: Bachelor's degree (e.g., BA, AB, BS)  Occupational History   Occupation: Production manager   Occupation: REPORTER    Employer: [employer]  Tobacco Use   Smoking status: Never   Smokeless tobacco: Never   Tobacco comments:    Never smoked 06/28/23  Vaping Use   Vaping status: Never Used  Substance and Sexual Activity   Alcohol use: Not Currently   Drug use: No   Sexual activity: Not on file  Other Topics Concern   Not on file  Social History Narrative   Married   hh of 2    p0g0   Works for [employer]    Social Drivers of Corporate investment banker Strain: Low Risk  (01/01/2024)   Overall Financial Resource Strain (CARDIA)    Difficulty of Paying Living Expenses: Not hard at all  Food Insecurity: No Food Insecurity (01/01/2024)   Hunger Vital Sign  Worried About Programme researcher, broadcasting/film/video in the Last Year: Never true    Ran Out of Food in the Last Year: Never true  Transportation Needs: No Transportation Needs (01/01/2024)   PRAPARE - Administrator, Civil Service (Medical): No    Lack of Transportation (Non-Medical): No  Physical Activity: Sufficiently Active (01/01/2024)   Exercise Vital Sign    Days of Exercise per Week: 3 days    Minutes of Exercise per Session: 60 min  Recent Concern: Physical Activity - Insufficiently Active (12/26/2023)   Exercise Vital Sign    Days of Exercise per Week: 2 days    Minutes of Exercise per Session: 50 min  Stress: Stress  Concern Present (01/01/2024)   [employer] of Occupational Health - Occupational Stress Questionnaire    Feeling of Stress : To some extent  Social Connections: Socially Integrated (01/01/2024)   Social Connection and Isolation Panel    Frequency of Communication with Friends and Family: More than three times a week    Frequency of Social Gatherings with Friends and Family: Twice a week    Attends Religious Services: 1 to 4 times per year    Active Member of [employer] or Organizations: Yes    Attends Engineer, structural: More than 4 times per year    Marital Status: Married  Catering manager Violence: Not At Risk (12/26/2023)   Humiliation, Afraid, Rape, and Kick questionnaire    Fear of Current or Ex-Partner: No    Emotionally Abused: No    Physically Abused: No    Sexually Abused: No    Physical Exam: Today's Vitals   06/05/24 1409  BP: (!) 169/60  Pulse: (!) 57  Resp: 13  Temp: 98.2 F (36.8 C)  TempSrc: Temporal  SpO2: 95%  Weight: 44 kg  Height: 5' 3 (1.6 m)  PainSc: 0-No pain   Body mass index is 17.18 kg/m. GEN: NAD EYE: Sclerae anicteric ENT: MMM CV: Non-tachycardic GI: Soft, NT/ND NEURO:  Alert & Oriented x 3  Lab Results: No results for input(s): WBC, HGB, HCT, PLT in the last 72 hours. BMET No results for input(s): NA, K, CL, CO2, GLUCOSE, BUN, CREATININE, CALCIUM  in the last 72 hours. LFT No results for input(s): PROT, ALBUMIN, AST, ALT, ALKPHOS, BILITOT, BILIDIR, IBILI in the last 72 hours. PT/INR No results for input(s): LABPROT, INR in the last 72 hours.   Impression / Plan: This is a 73 y.o.female who presents for EGD/EUS to evaluate chronic cough and dilated bile duct.  The risks of an EUS including intestinal perforation, bleeding, infection, aspiration, and medication effects were discussed as was the possibility it may not give a definitive diagnosis if a biopsy is performed.  When a biopsy  of the pancreas is done as part of the EUS, there is an additional risk of pancreatitis at the rate of about 1-2%.  It was explained that procedure related pancreatitis is typically mild, although it can be severe and even life threatening, which is why we do not perform random pancreatic biopsies and only biopsy a lesion/area we feel is concerning enough to warrant the risk.   The risks and benefits of endoscopic evaluation/treatment were discussed with the patient and/or family; these include but are not limited to the risk of perforation, infection, bleeding, missed lesions, lack of diagnosis, severe illness requiring hospitalization, as well as anesthesia and sedation related illnesses.  The patient's history has been reviewed, patient examined, no change in status, and  deemed stable for procedure.  The patient and/or family is agreeable to proceed.    Aloha Finner, MD [facility] Advanced Endoscopy Office # [phone]

## 2024-06-05 NOTE — Anesthesia Postprocedure Evaluation (Signed)
 Anesthesia Post Note  Patient: [name] [name]  Procedure(s) Performed: ULTRASOUND, UPPER GI TRACT, ENDOSCOPIC EGD (ESOPHAGOGASTRODUODENOSCOPY)     Patient location during evaluation: PACU Anesthesia Type: MAC Level of consciousness: awake and alert Pain management: pain level controlled Vital Signs Assessment: post-procedure vital signs reviewed and stable Respiratory status: spontaneous breathing, nonlabored ventilation, respiratory function stable and patient connected to nasal cannula oxygen Cardiovascular status: stable and blood pressure returned to baseline Postop Assessment: no apparent nausea or vomiting Anesthetic complications: no   No notable events documented.  Last Vitals:  Vitals:   06/05/24 1600 06/05/24 1610  BP: (!) 133/59 (!) 140/59  Pulse: 60 (!) 57  Resp: (!) 21 17  Temp:    SpO2: 97% 97%    Last Pain:  Vitals:   06/05/24 1550  TempSrc: Temporal  PainSc:                  Garnette [name]

## 2024-06-05 NOTE — Op Note (Addendum)
 [facility] Patient Name: [name] [name] Procedure Date: 06/05/2024 MRN: [id number] Attending MD: Aloha Finner , MD, [id number] Date of Birth: [birth year] CSN: [id number] Age: 73 Admit Type: Ambulatory Procedure:                Upper EUS Indications:              Common bile duct dilation (acquired) seen on MRCP,                            Chronic cough Providers:                Aloha Finner, MD, Randall Lines, RN, Fairy Marina, Technician Referring MD:             Aloha Finner, MD Medicines:                Monitored Anesthesia Care Complications:            No immediate complications. Estimated Blood Loss:     Estimated blood loss was minimal. Procedure:                Pre-Anesthesia Assessment:                           - Prior to the procedure, a History and Physical                            was performed, and patient medications and                            allergies were reviewed. The patient's tolerance of                            previous anesthesia was also reviewed. The risks                            and benefits of the procedure and the sedation                            options and risks were discussed with the patient.                            All questions were answered, and informed consent                            was obtained. Prior Anticoagulants: The patient has                            taken Eliquis  (apixaban ), last dose was 2 days                            prior to procedure. ASA Grade Assessment: III - A  patient with severe systemic disease. After                            reviewing the risks and benefits, the patient was                            deemed in satisfactory condition to undergo the                            procedure.                           After obtaining informed consent, the endoscope was                            passed under direct vision.  Throughout the                            procedure, the patient's blood pressure, pulse, and                            oxygen saturations were monitored continuously. The                            [id number]-[id number] ([id number]) Olympus endoscope was introduced                            through the mouth, and advanced to the second part                            of duodenum. The [id number]-[id number] ([id number]) Olympus                            duodenoscope was introduced through the mouth, and                            advanced to the area of papilla. The [id number]                            ([id number]) Olympus ultrasound scope was introduced                            through the mouth, and advanced to the duodenum for                            ultrasound examination from the stomach and                            duodenum. The upper EUS was accomplished without                            difficulty. The patient tolerated the procedure. Scope In: Scope Out: Findings:      ENDOSCOPIC FINDING: :      No gross lesions were noted  in the entire esophagus.      The Z-line was regular and was found 41 cm from the incisors.      A 2 cm hiatal hernia was present.      One non-bleeding linear gastric ulcer with a clean ulcer base (Forrest       Class III) was found in the gastric antrum. The lesion was 10 mm in       largest dimension.      Segmental moderate inflammation characterized by erosions, friability       and granularity was found in the entire examined stomach. Biopsies were       taken with a cold forceps for histology and Helicobacter pylori testing.      No gross lesions were noted in the duodenal bulb, in the first portion       of the duodenum and in the second portion of the duodenum.      A small non-bleeding diverticulum was found in the area of the papilla.      The major papilla was normal.      ENDOSONOGRAPHIC FINDING: :      There was dilation in the common bile duct (3.0 -> 4.8 -> 7.4 mm)  and in       the common hepatic duct (8.4 mm). No evidence of choledocholithiasis or       obstructing lesion.      There was no sign of significant endosonographic abnormality in the       gallbladder.      An anechoic lesion suggestive of a cyst was identified in the pancreatic       head. It does not communicate with the pancreatic duct. The lesion       measured 5 mm by 5 mm in maximal cross-sectional diameter. There was a       single compartment without septae. The outer wall of the lesion was       thin. There was no associated mass. There was no internal debris within       the fluid-filled cavity.      There was no sign of significant endosonographic parenchymal abnormality       in the pancreatic head, genu of the pancreas, pancreatic body,       pancreatic tail and uncinate process of the pancreas. No masses, no       calcifications were noted. The pancreatic duct was well visualized from       ampulla to tail (PDH = 2.6 mm, PDN = 2.0 mm, PDB = 1.8 mm, PDT = 1.3 mm).      Endosonographic imaging of the ampulla showed no intramural       (subepithelial) lesion.      No malignant-appearing lymph nodes were visualized in the celiac region       (level 20), peripancreatic region and porta hepatis region.      Endosonographic imaging in the visualized portion of the liver showed no       mass.      There was what appeared to be a variant vascular anatomy of the kidney       vessels based on endosonographic examination.      The celiac region was visualized. Impression:               EGD impression:                           -  No gross lesions in the entire esophagus. Z-line                            regular, 41 cm from the incisors.                           - 2 cm hiatal hernia.                           - Non-bleeding gastric ulcer with a clean ulcer                            base (Forrest Class III). Gastritis. Biopsied.                           - No gross lesions in the  duodenal bulb, in the                            first portion of the duodenum and in the second                            portion of the duodenum.                           - Non-bleeding duodenal diverticulum adjacent to a                            normal major papilla.                           EUS impression:                           - There was dilation in the common bile duct and in                            the common hepatic duct. No evidence of                            choledocholithiasis or obstructing lesion/mass.                           - There was no sign of significant pathology in the                            gallbladder.                           - A small 5 mm cystic lesion was seen in the                            pancreatic head. Tissue has not been obtained.                            However, the  endosonographic appearance is                            suggestive of an intraductal papillary mucinous                            neoplasm.                           - There was no sign of other significant                            parenchymal or ductal pathology in the pancreatic                            head, genu of the pancreas, pancreatic body,                            pancreatic tail and uncinate process of the                            pancreas.                           - No malignant-appearing lymph nodes were                            visualized in the celiac region (level 20),                            peripancreatic region and porta hepatis region.                           - Endosonographically, there was what appeared to                            be a variant vascular anatomy of the kidney vessels. Moderate Sedation:      Not Applicable - Patient had care per Anesthesia. Recommendation:           - The patient will be observed post-procedure,                            until all discharge criteria are met.                           -  Discharge patient to home.                           - Patient has a contact number available for                            emergencies. The signs and symptoms of potential                            delayed complications were discussed with the  patient. Return to normal activities tomorrow.                            Written discharge instructions were provided to the                            patient.                           - Resume previous diet.                           - Observe patient's clinical course.                           - Recommend initiation of PPI twice daily. The                            patient concerned with her previous history of                            rash, could consider high dose of famotidine. She                            was agreeable to PPI.                           - Restart Eliquis  on 8/8 PM to decrease risk of                            post-interventional bleeding.                           - Await path results.                           - Repeat EGD in 3 to 4 months to ensure healing of                            gastric ulcer and erosive gastritis.                           - Repeat MRI/MRCP in 1 year. Monitor biliary duct                            dilation and pancreatic cyst noted.                           - Will discuss with Radiology, if they see variant                            vasculature anatomy vs consideration of CT                            angiography to better evaluate her arterial  anatomy, as there seems to be variant anatomy in                            her region of her kidneys based on my examination                            today. This is nonurgent.                           - The findings and recommendations were discussed                            with the patient.                           - The findings and recommendations were discussed                             with the patient's family. Procedure Code(s):        --- Professional ---                           [phone], Esophagogastroduodenoscopy, flexible,                            transoral; with endoscopic ultrasound examination                            limited to the esophagus, stomach or duodenum, and                            adjacent structures                           43239, Esophagogastroduodenoscopy, flexible,                            transoral; with biopsy, single or multiple Diagnosis Code(s):        --- Professional ---                           K44.9, Diaphragmatic hernia without obstruction or                            gangrene                           K25.9, Gastric ulcer, unspecified as acute or                            chronic, without hemorrhage or perforation                           K29.70, Gastritis, unspecified, without bleeding                           K83.8, Other specified  diseases of biliary tract                           K86.2, Cyst of pancreas                           I89.9, Noninfective disorder of lymphatic vessels                            and lymph nodes, unspecified                           R93.89, Abnormal findings on diagnostic imaging of                            other specified body structures                           R05.3, Chronic cough                           K57.10, Diverticulosis of small intestine without                            perforation or abscess without bleeding CPT copyright 2022 [employer]. All rights reserved. The codes documented in this report are preliminary and upon coder review may  be revised to meet current compliance requirements. Aloha Finner, MD 06/05/2024 4:08:10 PM Number of Addenda: 0

## 2024-06-05 NOTE — Transfer of Care (Signed)
 Immediate Anesthesia Transfer of Care Note  Patient: [name] [name]  Procedure(s) Performed: ULTRASOUND, UPPER GI TRACT, ENDOSCOPIC EGD (ESOPHAGOGASTRODUODENOSCOPY)  Patient Location: PACU  Anesthesia Type:MAC  Level of Consciousness: drowsy  Airway & Oxygen Therapy: Patient Spontanous Breathing and Patient connected to nasal cannula oxygen  Post-op Assessment: Report given to RN and Post -op Vital signs reviewed and stable  Post vital signs: Reviewed and stable  Last Vitals:  Vitals Value Taken Time  BP    Temp    Pulse 59 06/05/24 15:50  Resp 23 06/05/24 15:50  SpO2 96 % 06/05/24 15:50  Vitals shown include unfiled device data.  Last Pain:  Vitals:   06/05/24 1409  TempSrc: Temporal  PainSc: 0-No pain         Complications: No notable events documented.

## 2024-06-05 NOTE — Anesthesia Preprocedure Evaluation (Addendum)
 Anesthesia Evaluation  Patient identified by MRN, date of birth, ID band Patient awake    Reviewed: Allergy & Precautions, H&P , NPO status , Patient's Chart, lab work & pertinent test results, reviewed documented beta blocker date and time   History of Anesthesia Complications (+) PONV and history of anesthetic complications (has done well the last few times w/ anesthesia)  Airway Mallampati: II  TM Distance: >3 FB Neck ROM: Full    Dental  (+) Teeth Intact, Dental Advisory Given   Pulmonary neg pulmonary ROS, pneumonia   Pulmonary exam normal breath sounds clear to auscultation       Cardiovascular hypertension, Pt. on medications and Pt. on home beta blockers + CAD  + dysrhythmias (eliquis  LD yesterday) Atrial Fibrillation + Valvular Problems/Murmurs (Mod AI, mod TR) AI  Rhythm:Regular Rate:Normal  Echo 02/2024  1. Left ventricular ejection fraction, by estimation, is 60 to 65%. The left ventricle has normal function. The left ventricle has no regional wall motion abnormalities. Left ventricular diastolic parameters are indeterminate.   2. Right ventricular systolic function is normal. The right ventricular size is normal. There is normal pulmonary artery systolic pressure. The estimated right ventricular systolic pressure is 26.2 mmHg.   3. Left atrial size was moderately dilated.   4. Right atrial size was severely dilated.   5. The mitral valve is normal in structure. No evidence of mitral valve regurgitation. No evidence of mitral stenosis.   6. Tricuspid valve regurgitation is moderate.   7. The aortic valve is calcified. There is mild calcification of the aortic valve. There is mild thickening of the aortic valve. Aortic valve regurgitation is moderate. Aortic valve sclerosis is present, with no evidence of aortic valve stenosis. Aortic regurgitation PHT measures 550 msec. Aortic valve area, by VTI measures 2.56 cm. Aortic valve  mean gradient measures 7.0 mmHg. Aortic valve Vmax measures 1.80 m/s.   8. The inferior vena cava is normal in size with greater than 50% respiratory variability, suggesting right atrial pressure of 3 mmHg.   Comparison(s): No significant change from prior study. Prior images reviewed side by side.     Neuro/Psych  PSYCHIATRIC DISORDERS Anxiety     negative neurological ROS     GI/Hepatic negative GI ROS, Neg liver ROS,,,  Endo/Other  negative endocrine ROS    Renal/GU negative Renal ROS  negative genitourinary   Musculoskeletal negative musculoskeletal ROS (+)    Abdominal   Peds negative pediatric ROS (+)  Hematology negative hematology ROS (+) Hb 14   Anesthesia Other Findings   Reproductive/Obstetrics negative OB ROS                              Anesthesia Physical Anesthesia Plan  ASA: 3  Anesthesia Plan: MAC   Post-op Pain Management: Minimal or no pain anticipated   Induction: Intravenous  PONV Risk Score and Plan: 4 or greater and Ondansetron , Dexamethasone  and Treatment may vary due to age or medical condition  Airway Management Planned: Natural Airway  Additional Equipment: None  Intra-op Plan:   Post-operative Plan:   Informed Consent: I have reviewed the patients History and Physical, chart, labs and discussed the procedure including the risks, benefits and alternatives for the proposed anesthesia with the patient or authorized representative who has indicated his/her understanding and acceptance.     Dental advisory given  Plan Discussed with: CRNA  Anesthesia Plan Comments: (Last airway note Ventilation: Mask ventilation without  difficulty Laryngoscope Size: Mac and 4 Grade View: Grade II Tube type: Oral Tube size: 7.0 mm Number of attempts: 1 )        Anesthesia Quick Evaluation

## 2024-06-05 NOTE — Discharge Instructions (Signed)

## 2024-06-06 ENCOUNTER — Telehealth: Payer: Self-pay

## 2024-06-06 NOTE — Telephone Encounter (Signed)
-----   Message from [facility] sent at 06/06/2024  5:25 AM EDT ----- Regarding: MRI recall [name] [name], This patient needs an MRI/MRCP in 1 year for follow-up of bile duct dilation and for pancreatic cyst surveillance. Thank you. GM

## 2024-06-06 NOTE — Telephone Encounter (Signed)
 The pt has been advised that we will call her in 1 year for MRI. EGD recall has been entered

## 2024-06-08 ENCOUNTER — Encounter (HOSPITAL_COMMUNITY): Payer: Self-pay | Admitting: Gastroenterology

## 2024-06-09 ENCOUNTER — Other Ambulatory Visit: Payer: Self-pay | Admitting: Cardiology

## 2024-06-09 DIAGNOSIS — I480 Paroxysmal atrial fibrillation: Secondary | ICD-10-CM

## 2024-06-09 LAB — SURGICAL PATHOLOGY

## 2024-06-09 NOTE — Telephone Encounter (Signed)
 Prescription refill request for Eliquis  received. Indication:afib Last office visit:7/25 Scr:0.74  7/25 Age: 73 Weight:44  kg  Prescription refilled

## 2024-06-10 ENCOUNTER — Encounter: Payer: Self-pay | Admitting: Cardiology

## 2024-06-10 ENCOUNTER — Ambulatory Visit: Payer: Self-pay | Admitting: Gastroenterology

## 2024-06-10 ENCOUNTER — Telehealth: Payer: Self-pay | Admitting: Gastroenterology

## 2024-06-10 NOTE — Telephone Encounter (Signed)
 Returned call to patient. Answered all questions regarding esomeprazole .

## 2024-06-10 NOTE — Telephone Encounter (Signed)
 Patient is calling to speak with someone in regards to her medication esomeprazole . Patient is requesting a call back. Please advise.

## 2024-06-11 NOTE — Telephone Encounter (Signed)
 Returned call to patient. Advised that it will effect snacks as she is taking the medication 30 mins before breakfast and 30 mins before dinner. Does not matter about snacks. Left detailed message for the patient.

## 2024-06-11 NOTE — Telephone Encounter (Signed)
 Patient called and stated that she is not sure she understood your answer to when she should start her Esomeprazole  when it comes to having a snack. Patient is requesting a call back. Please advise.

## 2024-06-25 DIAGNOSIS — L820 Inflamed seborrheic keratosis: Secondary | ICD-10-CM | POA: Diagnosis not present

## 2024-06-25 DIAGNOSIS — D492 Neoplasm of unspecified behavior of bone, soft tissue, and skin: Secondary | ICD-10-CM | POA: Diagnosis not present

## 2024-06-25 DIAGNOSIS — L538 Other specified erythematous conditions: Secondary | ICD-10-CM | POA: Diagnosis not present

## 2024-07-14 ENCOUNTER — Ambulatory Visit (HOSPITAL_COMMUNITY)
Admission: RE | Admit: 2024-07-14 | Discharge: 2024-07-14 | Disposition: A | Discharge status: Home / Self Care | Source: Ambulatory Visit | Attending: Physician Assistant | Admitting: Physician Assistant

## 2024-07-14 VITALS — BP 130/60 | HR 65 | Ht 63.0 in | Wt 100.8 lb

## 2024-07-14 DIAGNOSIS — I4891 Unspecified atrial fibrillation: Secondary | ICD-10-CM | POA: Diagnosis not present

## 2024-07-14 DIAGNOSIS — Z5181 Encounter for therapeutic drug level monitoring: Secondary | ICD-10-CM

## 2024-07-14 DIAGNOSIS — D6869 Other thrombophilia: Secondary | ICD-10-CM

## 2024-07-14 DIAGNOSIS — Z79899 Other long term (current) drug therapy: Secondary | ICD-10-CM | POA: Diagnosis not present

## 2024-07-14 DIAGNOSIS — I480 Paroxysmal atrial fibrillation: Secondary | ICD-10-CM

## 2024-07-14 NOTE — Progress Notes (Signed)
 Primary Care Physician: Charlett Apolinar POUR, MD Referring Physician: Dr. Lavona EP: Dr. Inocencio Stephane JONETTA [name] [name] is a 73 y.o. female with a h/o HTN, paroxysmal afib, initially diagnosed with afib for several years. She is on rate control/flecainde  meds as well as eliquis  5 mg for a chadsvasc score  of 3. She is in the afib clinic to f/u ablation one month ago. She did have some episodes the second week of ablation but sine 18th of April she has not had anymore episodes. She did have an allergic reaction on her torso, probaly form the pads that were placed on chest for cardioversion. It has resolved. No swallowing or groin issues. She is back to her usual activities.    Follow up in the AF clinic 05/31/21. Patient presents for dofetilide  admission. She reports that she has had afib about every other day. She has stopped flecainide  3 days ago. She denies any missed doses of anticoagulation.   Follow up in the AF clinic 05/31/21. Patient is s/p dofetilide  admission 8/2-06/03/21. Patient reports she has not had any afib since starting the medication which is an improvement for her. She denies any bleeding issues on anticoagulation.   Follow up in the AF clinic 06/28/23. Patient reports that she has been having more frequent afib episodes, sometimes multiple episodes per day. There does not appear to be a specific trigger for these episodes. She keeps a log with her Kardia mobile device. She has symptoms of palpitations and fatigue.  Follow up in the AF clinic 10/01/23. Patient is s/p repeat afib ablation on 09/03/23 with Dr Inocencio. She reports that she has done well since the ablation with no episodes detected on her Kardia mobile. She denies chest pain or groin issues.   Follow up 02/20/24. Patient returns for follow up for atrial fibrillation and dofetilide  monitoring. She was seen by Dr Michele as a DOD add on 4/18 for increased afib episodes. She had two episodes back to back, one lasting 13 hours. Her BB was  increased. Since then, she has not had any further afib episodes. She feels slightly more fatigued on the higher dose but this is tolerable for her. There were no specific triggers for her afib. No bleeding issues on anticoagulation.   Follow up 07/14/24. Patient returns for follow up for atrial fibrillation and dofetilide  monitoring. She reports that in the past 2 weeks she has had 4-5 episodes of afib lasting 2-4 hours. There were no specific triggers she could identify but admits she was stressed with some other medical procedures (EGD and mole removal).    Today, she  denies symptoms of chest pain, shortness of breath, orthopnea, PND, lower extremity edema, dizziness, presyncope, syncope, snoring, daytime somnolence, bleeding, or neurologic sequela. The patient is tolerating medications without difficulties and is otherwise without complaint today.    Past Medical History:  Diagnosis Date   Allergic rhinitis    Aortic insufficiency    CAT SCRATCH 09/10/2009   Qualifier: Diagnosis of  By: Charlett MD, Apolinar POUR    Clavicle fracture 12/2006   left   Diverticulosis    HTN (hypertension)    Osteopenia    Paroxysmal atrial fibrillation (HCC)    Pneumonia    PONV (postoperative nausea and vomiting)    Vision disturbance 03/13/2013   Negative neuro MRA MRI    Current Outpatient Medications  Medication Sig Dispense Refill   acetaminophen  (TYLENOL ) 500 MG tablet Take 325-500 mg by mouth as needed  for moderate pain (pain score 4-6). (Patient taking differently: Take 500 mg by mouth every 6 (six) hours as needed for moderate pain (pain score 4-6).)     alendronate  (FOSAMAX ) 70 MG tablet TAKE 1 TABLET BY MOUTH WEEKLY  WITH A FULL GLASS OF WATER ON AN EMPTY STOMACH 12 tablet 3   chlorhexidine (PERIDEX) 0.12 % solution Use as directed 15 mLs in the mouth or throat 2 (two) times daily.     Cholecalciferol (VITAMIN D3) 1000 units CAPS Take 1,000 Units by mouth daily. (Patient taking differently: Take  2,000 Units by mouth daily.)     Coenzyme Q10 (COQ10) 100 MG CAPS Take 100 mg by mouth daily with supper.     diltiazem  (CARDIZEM ) 30 MG tablet TAKE 1 TABLET EVERY 4 HOURS AS NEEDED FOR BREAKTHROUGH AFIB 45 tablet 1   diltiazem  (TIAZAC ) 300 MG 24 hr capsule Take 1 capsule (300 mg total) by mouth daily. 5 capsule 0   dofetilide  (TIKOSYN ) 250 MCG capsule TAKE 1 CAPSULE BY MOUTH 2 TIMES DAILY. 180 capsule 2   ELIQUIS  5 MG TABS tablet TAKE 1 TABLET BY MOUTH TWICE  DAILY 200 tablet 2   esomeprazole  (NEXIUM ) 40 MG capsule Take 1 capsule (40 mg total) by mouth 2 (two) times daily before a meal. Twice daily for 2 months then once daily 60 capsule 12   LORazepam  (ATIVAN ) 0.5 MG tablet Take 1 tablet (0.5 mg total) by mouth daily as needed for anxiety (when flying). 20 tablet 0   metoprolol  tartrate (LOPRESSOR ) 50 MG tablet Take 1 tablet (50 mg total) by mouth 2 (two) times daily. 180 tablet 2   Multiple Vitamins-Minerals (PRESERVISION AREDS PO) Take 1 capsule by mouth in the morning and at bedtime.     potassium chloride  (KLOR-CON ) 10 MEQ tablet TAKE 2 TABLETS BY MOUTH DAILY 200 tablet 2   pravastatin  (PRAVACHOL ) 20 MG tablet TAKE 1 TABLET BY MOUTH IN THE  EVENING 100 tablet 2   No current facility-administered medications for this encounter.    ROS- All systems are reviewed and negative except as per the HPI above  Physical Exam: Vitals:   07/14/24 0913  BP: 130/60  Pulse: 65  Weight: 45.7 kg  Height: 5' 3 (1.6 m)     Wt Readings from Last 3 Encounters:  07/14/24 45.7 kg  06/05/24 44 kg  05/16/24 45 kg    GEN: Well nourished, well developed in no acute distress CARDIAC: Regular rate and rhythm, no rubs, gallops, 2/6 systolic murmur  RESPIRATORY:  Clear to auscultation without rales, wheezing or rhonchi  ABDOMEN: Soft, non-tender, non-distended EXTREMITIES:  No edema; No deformity    EKG today demonstrates SR Vent. rate 65 BPM PR interval 174 ms QRS duration 78 ms QT/QTcB 448/465  ms   Echo 02/29/24  1. Left ventricular ejection fraction, by estimation, is 60 to 65%. The  left ventricle has normal function. The left ventricle has no regional  wall motion abnormalities. Left ventricular diastolic parameters are  indeterminate.   2. Right ventricular systolic function is normal. The right ventricular  size is normal. There is normal pulmonary artery systolic pressure. The  estimated right ventricular systolic pressure is 26.2 mmHg.   3. Left atrial size was moderately dilated.   4. Right atrial size was severely dilated.   5. The mitral valve is normal in structure. No evidence of mitral valve  regurgitation. No evidence of mitral stenosis.   6. Tricuspid valve regurgitation is moderate.   7.  The aortic valve is calcified. There is mild calcification of the  aortic valve. There is mild thickening of the aortic valve. Aortic valve  regurgitation is moderate. Aortic valve sclerosis is present, with no  evidence of aortic valve stenosis. Aortic regurgitation PHT measures 550 msec. Aortic valve area, by VTI measures 2.56 cm. Aortic valve mean gradient measures 7.0 mmHg. Aortic valve Vmax measures 1.80 m/s.   8. The inferior vena cava is normal in size with greater than 50%  respiratory variability, suggesting right atrial pressure of 3 mmHg.   Comparison(s): No significant change from prior study.    CHA2DS2-VASc Score = 4  The patient's score is based upon: CHF History: 0 HTN History: 1 Diabetes History: 0 Stroke History: 0 Vascular Disease History: 1 Age Score: 1 Gender Score: 1       ASSESSMENT AND PLAN: Paroxysmal Atrial Fibrillation (ICD10:  I48.0) The patient's CHA2DS2-VASc score is 4, indicating a 4.8% annual risk of stroke.   S/p afib ablation 02/01/21 and 09/03/23 S/p dofetilide  loading 05/2021 She is in SR today but has had more episodes recently. We discussed rhythm control options including amiodarone and ablation. She would like to continue her  present therapy. She will try using a 1/2 dose of BB PRN to see if this works better then PRN CCB.  Continue dofetilide  250 mcg BID Continue Eliquis  5 mg BID Continue Lopressor  50 mg BID Continue diltiazem  300 mg daily with 30 mg PRN for heart racing.   Secondary Hypercoagulable State (ICD10:  D68.69) The patient is at significant risk for stroke/thromboembolism based upon her CHA2DS2-VASc Score of 4.  Continue Apixaban  (Eliquis ). No bleeding issues.   High Risk Medication Monitoring (ICD 10: J342684) Patient requires ongoing monitoring for anti-arrhythmic medication which has the potential to cause life threatening arrhythmias. QT interval on ECG acceptable for dofetilide  monitoring. Check bmet/mag at next follow up.   HTN Stable on current regimen  CAD No anginal symptoms Followed by Dr Lavona    Follow up in the AF clinic in 4 months.    Daril Kicks PA-C [facility] [address] [place], [place] [address] [phone]

## 2024-08-04 ENCOUNTER — Ambulatory Visit

## 2024-08-04 DIAGNOSIS — Z79899 Other long term (current) drug therapy: Secondary | ICD-10-CM | POA: Diagnosis not present

## 2024-08-04 NOTE — Progress Notes (Signed)
 08/04/2024 Name: [name] [name] MRN: [id number] DOB: 1951/05/10   Stephane JONETTA [name] is a 73 y.o. year old female who was referred for medication management by their primary care provider, [name], [name], MD. They presented for a face to face visit today.   They were referred to the pharmacist by their PCP for assistance in managing complex medication management    Subjective:  Care Team: Primary Care Provider: [name], [name], MD  Medication Access/Adherence  Current Pharmacy:  [facility]) - [place], [place] - [address] [address] [facility] [address] [place] [address] [place] [address] Phone: [phone] Fax: [phone]  [facility] - [place], [place] - [address] [place] [address] Phone: [phone] Fax: [phone]  [facility] [phone] [place], [place] - [address] [place] [address] Phone: [phone] Fax: [phone]   Patient reports affordability concerns with their medications: No  Patient reports access/transportation concerns to their pharmacy: No  Patient reports adherence concerns with their medications:  No     Atrial Fibrillation:  Current medications: Rate Control: Metoprolol  tartrate 50mg  1 tab BID Rhythm Control: Dofetilide  1 BID, Diltiazem  300mg  1 every day, Diltiazem  30mg  q4h prn breakthrough afib Anticoagulation Regimen: Eliquis  5mg  BID  Medications tried in the past: Flecainide , Propranolol   CHADS2VASC: 3  Has noticed an increase in afib episodes since recent trip to [place] , reports afib clinic told her to try to use metoprolol  1/2 tab prn breakthrough afib episodes, has not started yet  Patient denies hypotensive s/sx including dizziness, lightheadedness. Patient denies hypertensive symptoms including headache, chest pain, shortness of breath   Current medication access support: Tikosyn  on [facility]  Reports no a fib episodes since  ablation procedure last month   Osteoporosis:  Current medications: Alendronate  70mg  once weekly Medications tried in the past: None  Current supplements: Vitamin D  2000 units daily, Calcium  - from her diet    Most recent DEXA: 10/10/22, bone health did improve but still at -2.5 for LFN  Current medication access support: None  Nexium  Use: -Patient wants clarification of timing of med before meals and wonders how long she will need medication -Denies any GI symptoms -Currently taking 1 capsule BID but reports she is to drop down to 1 capsule daily 08/11/24   Objective:   Lab Results  Component Value Date   CREATININE 0.74 04/29/2024   BUN 32 (H) 04/29/2024   NA 144 04/29/2024   K 4.6 04/29/2024   CL 105 04/29/2024   CO2 23 04/29/2024    Lab Results  Component Value Date   CHOL 146 03/13/2024   HDL 72.60 03/13/2024   LDLCALC 59 03/13/2024   LDLDIRECT 131.0 09/03/2009   TRIG 75.0 03/13/2024   CHOLHDL 2 03/13/2024    Medications Reviewed Today     Reviewed by [name], [name] (Pharmacist) on 08/04/24 at 1155  Med List Status: <None>   Medication Order Taking? Sig Documenting Provider Last Dose Status Informant  acetaminophen  (TYLENOL ) 500 MG tablet [id number]  Take 325-500 mg by mouth as needed for moderate pain (pain score 4-6).  Patient taking differently: Take 500 mg by mouth every 6 (six) hours as needed for moderate pain (pain score 4-6).   [provider]  Active Self           Med Note ([name], [name]   Mon Mar 31, 2024 10:03 AM)  alendronate  (FOSAMAX ) 70 MG tablet [id number]  TAKE 1 TABLET BY MOUTH WEEKLY  WITH A FULL GLASS OF WATER ON AN EMPTY STOMACH Trixie File, MD  Active   chlorhexidine (PERIDEX) 0.12 % solution [id number]  Use as directed 15 mLs in the mouth or throat 2 (two) times daily. [provider]  Active   Cholecalciferol (VITAMIN D3) 1000 units CAPS [id number]  Take 1,000 Units by mouth daily.  Patient taking  differently: Take 2,000 Units by mouth daily.   [provider]  Active Self  Coenzyme Q10 (COQ10) 100 MG CAPS [id number]  Take 100 mg by mouth daily with supper. [provider]  Active Self  diltiazem  (CARDIZEM ) 30 MG tablet [id number]  TAKE 1 TABLET EVERY 4 HOURS AS NEEDED FOR BREAKTHROUGH AFIB [name], [name], PA  Active Self           Med Note ([name], [name]   Mon Mar 31, 2024 10:03 AM)    diltiazem  (TIAZAC ) 300 MG 24 hr capsule [id number]  Take 1 capsule (300 mg total) by mouth daily. [name], MD  Active   dofetilide  (TIKOSYN ) 250 MCG capsule [id number]  TAKE 1 CAPSULE BY MOUTH 2 TIMES DAILY. [name], PA-C  Active   ELIQUIS  5 MG TABS tablet [id number]  TAKE 1 TABLET BY MOUTH TWICE  DAILY [name], MD  Active   esomeprazole  (NEXIUM ) 40 MG capsule [id number]  Take 1 capsule (40 mg total) by mouth 2 (two) times daily before a meal. Twice daily for 2 months then once daily [name], [name], MD  Active   LORazepam  (ATIVAN ) 0.5 MG tablet [id number]  Take 1 tablet (0.5 mg total) by mouth daily as needed for anxiety (when flying). [name], [name], MD  Active   metoprolol  tartrate (LOPRESSOR ) 50 MG tablet [id number]  Take 1 tablet (50 mg total) by mouth 2 (two) times daily. [name], [name], PA  Active   Multiple Vitamins-Minerals (PRESERVISION AREDS PO) [id number]  Take 1 capsule by mouth in the morning and at bedtime. [provider]  Active Self  potassium chloride  (KLOR-CON ) 10 MEQ tablet [id number]  TAKE 2 TABLETS BY MOUTH DAILY [name], MD  Active   pravastatin  (PRAVACHOL ) 20 MG tablet [id number]  TAKE 1 TABLET BY MOUTH IN THE  KARNA [name], MD  Active               Assessment/Plan:   Atrial Fibrillation: - Currently controlled - Reviewed importance of adherence to anticoagulant for stroke prevention. - Reviewed appropriate blood pressure monitoring technique and reviewed goal blood pressure. Recommended to check home  blood pressure and heart rate daily -Continue current medication therapy. Re-enforced recommendation per a-fib clinic that she can use 1/2 tab of metoprolol  for a-fib breakthrough in place of diltiazem  to see if this is more effective. Counseled on max daily dose and monitoring HR/BP  Osteoporosis: - Currently appropriately managed - Reviewed recommendation for daily calcium  intake of 1200 mg from diet and vitamin D  intake of [id number] units - Reviewed benefits of weight bearing exercise -Continue medication therapy per endo, reevaluate alendronate  drug holiday at sch endo visit in Dec  Nexium  Use -Encouraged patient to follow up with GI 2 weeks after decreasing Nexium  to check in on symptoms and if they think appropriate to discontinue use after a certain number of weeks   Follow Up Plan: 3 months  Jon VEAR [place], PharmD Clinical Pharmacist [phone]

## 2024-08-08 ENCOUNTER — Ambulatory Visit
Admission: RE | Admit: 2024-08-08 | Discharge: 2024-08-08 | Disposition: A | Discharge status: Home / Self Care | Source: Ambulatory Visit | Attending: Cardiology | Admitting: Cardiology

## 2024-08-08 DIAGNOSIS — I071 Rheumatic tricuspid insufficiency: Secondary | ICD-10-CM

## 2024-08-08 DIAGNOSIS — I351 Nonrheumatic aortic (valve) insufficiency: Secondary | ICD-10-CM

## 2024-08-08 DIAGNOSIS — I77810 Thoracic aortic ectasia: Secondary | ICD-10-CM

## 2024-08-08 DIAGNOSIS — I712 Thoracic aortic aneurysm, without rupture, unspecified: Secondary | ICD-10-CM | POA: Diagnosis not present

## 2024-08-08 MED ORDER — IOPAMIDOL (ISOVUE-370) INJECTION 76%
80.0000 mL | Freq: Once | INTRAVENOUS | Status: AC | PRN
  Administered 2024-08-08: 80 mL via INTRAVENOUS

## 2024-08-11 ENCOUNTER — Encounter: Payer: Self-pay | Admitting: Cardiology

## 2024-08-12 ENCOUNTER — Telehealth: Payer: Self-pay | Admitting: Cardiology

## 2024-08-12 DIAGNOSIS — I351 Nonrheumatic aortic (valve) insufficiency: Secondary | ICD-10-CM

## 2024-08-12 NOTE — Telephone Encounter (Signed)
 Spoke to patient and advised results of CT. Pt would like to know if there are other recommendations to help worsening of aortic dilation. Orders have been placed per result note advise.

## 2024-08-12 NOTE — Telephone Encounter (Signed)
Pt returning call,  Please advise

## 2024-08-12 NOTE — Telephone Encounter (Signed)
 [name], MD to Manda Lyle NOVAK, RN     08/12/24  3:00 PM No other med changes.  We just follow over time.    She asked me to look at other CT's and the measurements with those- measured 4.2-4.3 with all scans- virtually no change.   She verbalized understanding.

## 2024-08-12 NOTE — Telephone Encounter (Signed)
 Patient was call, see phone note from 08/12/24.

## 2024-08-12 NOTE — Telephone Encounter (Signed)
  Patient states she is returning Cecilia's call regarding results

## 2024-08-12 NOTE — Telephone Encounter (Signed)
 Left message to call back to be advised message from Dr. Lavona.

## 2024-08-14 ENCOUNTER — Encounter: Payer: Self-pay | Admitting: Internal Medicine

## 2024-08-14 DIAGNOSIS — H9313 Tinnitus, bilateral: Secondary | ICD-10-CM

## 2024-08-19 NOTE — Telephone Encounter (Signed)
 So Ok to refer   [name] [name] if you can find the previous can refer back  otherwise new referral  and consider  referral to [facility]

## 2024-08-20 ENCOUNTER — Ambulatory Visit (INDEPENDENT_AMBULATORY_CARE_PROVIDER_SITE_OTHER)

## 2024-08-20 DIAGNOSIS — Z23 Encounter for immunization: Secondary | ICD-10-CM

## 2024-08-21 LAB — HM MAMMOGRAPHY

## 2024-08-22 DIAGNOSIS — Z1231 Encounter for screening mammogram for malignant neoplasm of breast: Secondary | ICD-10-CM | POA: Diagnosis not present

## 2024-08-25 ENCOUNTER — Telehealth: Payer: Self-pay | Admitting: Internal Medicine

## 2024-08-25 ENCOUNTER — Other Ambulatory Visit (HOSPITAL_COMMUNITY): Payer: Self-pay

## 2024-08-25 NOTE — Telephone Encounter (Signed)
 Copied from CRM [id number]. Topic: Referral - Question >> Aug 25, 2024  3:54 PM Robinson H wrote: Reason for CRM: [facility] calling stating that office received referral but didn't have any insurance information included on referral.  [facility] [phone]

## 2024-08-27 ENCOUNTER — Encounter: Payer: Self-pay | Admitting: Gastroenterology

## 2024-08-28 ENCOUNTER — Encounter: Payer: Self-pay | Admitting: Internal Medicine

## 2024-09-03 ENCOUNTER — Other Ambulatory Visit (HOSPITAL_COMMUNITY): Payer: Self-pay | Admitting: Physician Assistant

## 2024-09-03 DIAGNOSIS — I480 Paroxysmal atrial fibrillation: Secondary | ICD-10-CM

## 2024-09-20 ENCOUNTER — Other Ambulatory Visit: Payer: Self-pay | Admitting: Cardiology

## 2024-09-20 DIAGNOSIS — E785 Hyperlipidemia, unspecified: Secondary | ICD-10-CM

## 2024-09-29 ENCOUNTER — Encounter: Payer: Self-pay | Admitting: Internal Medicine

## 2024-09-29 ENCOUNTER — Ambulatory Visit: Payer: Medicare Other | Admitting: Internal Medicine

## 2024-09-29 VITALS — BP 120/70 | HR 61 | Ht 63.0 in | Wt 99.6 lb

## 2024-09-29 DIAGNOSIS — M810 Age-related osteoporosis without current pathological fracture: Secondary | ICD-10-CM | POA: Diagnosis not present

## 2024-09-29 DIAGNOSIS — E559 Vitamin D deficiency, unspecified: Secondary | ICD-10-CM

## 2024-09-29 NOTE — Progress Notes (Signed)
 Patient ID: [name] [name], female   DOB: [birth year], 73 y.o.   MRN: [id number]   HPI  [name] [name] is a 73 y.o.-year-old pleasant female, returning for follow-up for osteoporosis.  Last visit 1 year ago.  Interim history: No  fractures since last visit. She had 1 fall since last OV - missed a chair in the dark 10/2023. No dizziness/vertigo/orthostasis/poor vision. At last visit, she had hip pain >> started PT >> worse, she started to exercise more: Walking, gym:3x a week.  Now resolved. However, she may have B posterior upper buttocks pain especially after sitting down. Tylenol  helps.  Reviewed history: She was diagnosed with osteoporosis in 2017.  Reviewed previous DXA scan reports:  10/10/2022 ([address]) Lumbar spine L1-L3 Femoral neck (FN)  T-score   -2.0 RFN: -2.3 LFN: -2.5  Change in BMD from previous DXA test (%) Up 1.9% Up 0.1%  (*) statistically significant  09/28/2020 ([place]) Lumbar spine L1-L4 Femoral neck (FN)  T-score  -1.9 RFN: -2.5 LFN: -2.3  Change in BMD from previous DXA test (%) Down 2.% Up 2.6%   09/23/2018 ([place]) Lumbar spine L1-L4 Femoral neck (FN)  T-score  -1.7 RFN: -2.7 LFN: -2.3  Change in BMD from previous DXA test (%) Up 1.4% Down 0.4%   Date L1-L4 T score FN T score FRAX   07/27/2016  -1.8 (-3.4%*) RFN: -2.6 (-10.2%*) LFN: -2.4   09/30/2010  -1.6 RFN: -2.0 LFN: -1.8 10 year MOF: 11.9%  10 year hip fracture: 2.2%    No fracture since last visitshe has a history of fract, however, ures: 1. collarbone - ~2010 - fell in a parking lot 2. L foot fx and sprained ankle - few years ago - tripped and fell 3. R foot fx and sprained ankle - 10/2015 She had pain in L shin >> saw Dr. Ruthellen >> had Xrays >> osteoma.  Pain improved.  Reviewed previous osteoporosis treatments: - Fosamax  for 5 years  - up to 2011 - We restarted Fosamax  70 mg weekly in 12/2016.  She tolerates this well  She has a history of vitamin D  insufficiency: Lab Results   Component Value Date   VD25OH 42.90 03/13/2024   VD25OH 36 10/02/2023   VD25OH 52.0 10/05/2022   VD25OH 42.31 09/29/2021   VD25OH 44.3 09/28/2020   VD25OH 57.64 11/20/2019   VD25OH 43.25 09/23/2018   VD25OH 40.16 10/19/2017   VD25OH 31.41 12/20/2016   VD25OH 28.88 (L) 09/14/2016   She is on 2000 units vitamin D  daily . She was on calcium  and Citracal - stopped before OV.  She was walking and also doing Zumba, strength training 1-2 times a week but not at last visit.  Since then, she started physical therapy and now continues to exercise 30 min 2 times a week: Walking, some strength exercises.  She is not on vitamin A.  Menopause was in her late 35s.  She was on HRT.  Pt does have a FH of osteoporosis in mother - kyphosis.  No fractures.  No history of kidney stones but had calcium  oxalate crystals in urine.  No consistent hyper or hypocalcemia or hyperparathyroidism: Lab Results  Component Value Date   PTH 59 09/14/2016   CALCIUM  10.7 (H) 04/29/2024   CALCIUM  9.4 03/13/2024   CALCIUM  9.7 11/01/2023   CALCIUM  9.9 10/01/2023   CALCIUM  9.8 08/21/2023   CALCIUM  10.7 (H) 04/26/2023   CALCIUM  9.9 04/09/2023   CALCIUM  10.6 (H) 10/05/2022   CALCIUM  10.2 04/17/2022   CALCIUM   10.5 01/11/2022   No history of thyrotoxicosis: Lab Results  Component Value Date   TSH 1.24 03/13/2024   TSH 1.17 01/11/2022   TSH 0.891 06/24/2021   TSH 0.82 09/28/2020   TSH 0.867 10/06/2019   No history of CKD: Lab Results  Component Value Date   BUN 32 (H) 04/29/2024   CREATININE 0.74 04/29/2024   She is on flecainide , metoprolol , Eliquis  for paroxysmal A. fib.  She continues to go to the A. fib clinic.  She noticed that after stopping Singulair , she had less A. fib episodes. She had pelvic training at [facility].   ROS: + see HPI  I reviewed pt's medications, allergies, PMH, social hx, family hx, and changes were documented in the history of present illness. Otherwise, unchanged from  my initial visit note.  Past Medical History:  Diagnosis Date   Allergic rhinitis    Aortic insufficiency    CAT SCRATCH 09/10/2009   Qualifier: Diagnosis of  By: Charlett MD, Apolinar POUR    Clavicle fracture 12/2006   left   Diverticulosis    HTN (hypertension)    Osteopenia    Paroxysmal atrial fibrillation (HCC)    Pneumonia    PONV (postoperative nausea and vomiting)    Vision disturbance 03/13/2013   Negative neuro MRA MRI   Past Surgical History:  Procedure Laterality Date   ABDOMINAL HYSTERECTOMY     pt denies 04/15/14   APPENDECTOMY     ATRIAL FIBRILLATION ABLATION N/A 02/01/2021   Procedure: ATRIAL FIBRILLATION ABLATION;  Surgeon: Kelsie Agent, MD;  Location: [facility] INVASIVE CV LAB;  Service: Cardiovascular;  Laterality: N/A;   ATRIAL FIBRILLATION ABLATION N/A 09/03/2023   Procedure: ATRIAL FIBRILLATION ABLATION;  Surgeon: Inocencio Soyla Lunger, MD;  Location: [facility] INVASIVE CV LAB;  Service: Cardiovascular;  Laterality: N/A;   BRONCHIAL WASHINGS  09/05/2021   Procedure: BRONCHIAL WASHINGS;  Surgeon: Shelah Lamar RAMAN, MD;  Location: [facility] ENDOSCOPY;  Service: Cardiopulmonary;;   ESOPHAGOGASTRODUODENOSCOPY N/A 06/05/2024   Procedure: EGD (ESOPHAGOGASTRODUODENOSCOPY);  Surgeon: Wilhelmenia Aloha Raddle., MD;  Location: [facility];  Service: Gastroenterology;  Laterality: N/A;   EUS N/A 06/05/2024   Procedure: ULTRASOUND, UPPER GI TRACT, ENDOSCOPIC;  Surgeon: Wilhelmenia Aloha Raddle., MD;  Location: [facility] ENDOSCOPY;  Service: Gastroenterology;  Laterality: N/A;   LAPAROSCOPIC SALPINGO OOPHERECTOMY Right    TONSILLECTOMY AND ADENOIDECTOMY     VIDEO BRONCHOSCOPY N/A 09/05/2021   Procedure: VIDEO BRONCHOSCOPY WITHOUT FLUORO;  Surgeon: Shelah Lamar RAMAN, MD;  Location: [facility] ENDOSCOPY;  Service: Cardiopulmonary;  Laterality: N/A;   Social History   Social History   Marital status: Married    Spouse name: N/A   Number of children: 0 - had infertility tx's in her 30s   Occupational History   [employer]sp[employer]  J[employer]    [employer]   Social History Main Topics   Smoking status: Never Smoker   Smokeless tobacco: Never Used   Alcohol use No     Comment: never   Drug use: No   Social History Narrative   Married   hh of 2    p0g0   Works for [employer] and [employer]    Current Outpatient Medications on File Prior to Visit  Medication Sig Dispense Refill   acetaminophen  (TYLENOL ) 500 MG tablet Take 325-500 mg by mouth as needed for moderate pain (pain score 4-6). (Patient taking differently: Take 500 mg by mouth every 6 (six) hours as needed for moderate pain (pain score 4-6).)     alendronate  (FOSAMAX )  70 MG tablet TAKE 1 TABLET BY MOUTH WEEKLY  WITH A FULL GLASS OF WATER ON AN EMPTY STOMACH 12 tablet 3   chlorhexidine (PERIDEX) 0.12 % solution Use as directed 15 mLs in the mouth or throat 2 (two) times daily.     Cholecalciferol (VITAMIN D3) 1000 units CAPS Take 1,000 Units by mouth daily. (Patient taking differently: Take 2,000 Units by mouth daily.)     Coenzyme Q10 (COQ10) 100 MG CAPS Take 100 mg by mouth daily with supper.     diltiazem  (CARDIZEM ) 30 MG tablet TAKE 1 TABLET EVERY 4 HOURS AS NEEDED FOR BREAKTHROUGH AFIB 45 tablet 1   diltiazem  (TIAZAC ) 300 MG 24 hr capsule Take 1 capsule (300 mg total) by mouth daily. 5 capsule 0   dofetilide  (TIKOSYN ) 250 MCG capsule TAKE 1 CAPSULE BY MOUTH 2 TIMES DAILY. 180 capsule 2   ELIQUIS  5 MG TABS tablet TAKE 1 TABLET BY MOUTH TWICE  DAILY 200 tablet 2   esomeprazole  (NEXIUM ) 40 MG capsule Take 1 capsule (40 mg total) by mouth 2 (two) times daily before a meal. Twice daily for 2 months then once daily 60 capsule 12   LORazepam  (ATIVAN ) 0.5 MG tablet Take 1 tablet (0.5 mg total) by mouth daily as needed for anxiety (when flying). 20 tablet 0   metoprolol  tartrate (LOPRESSOR ) 50 MG tablet TAKE 1 TABLET BY MOUTH TWICE  DAILY 200 tablet 2   Multiple Vitamins-Minerals (PRESERVISION AREDS PO) Take 1 capsule by mouth in the morning and at bedtime.      potassium chloride  (KLOR-CON ) 10 MEQ tablet TAKE 2 TABLETS BY MOUTH DAILY 200 tablet 2   pravastatin  (PRAVACHOL ) 20 MG tablet TAKE 1 TABLET BY MOUTH IN THE  EVENING 100 tablet 2   No current facility-administered medications on file prior to visit.   Allergies  Allergen Reactions   Latex Hives   Other Rash and Other (See Comments)    Adhesive pads left on skin for extended periods = BURNS AND RASHES ON THE SKIN!!!!   Codeine Phosphate Other (See Comments)    Drowsiness    Demerol [Meperidine] Nausea And Vomiting   Protonix  [Pantoprazole ] Rash   Sulfa  Antibiotics Rash   Tens Therapy Replace Back Pads Itching and Rash   Family History  Problem Relation Age of Onset   Scleroderma Mother    Hyperlipidemia Mother    Coronary artery disease Father 64   Esophageal cancer Paternal Grandmother    PE: BP 120/70   Pulse 61   Ht 5' 3 (1.6 m)   Wt 99 lb 9.6 oz (45.2 kg)   SpO2 97%   BMI 17.64 kg/m  Wt Readings from Last 10 Encounters:  09/29/24 99 lb 9.6 oz (45.2 kg)  07/14/24 100 lb 12.8 oz (45.7 kg)  06/05/24 97 lb (44 kg)  05/16/24 99 lb 1.6 oz (45 kg)  05/07/24 97 lb 6.4 oz (44.2 kg)  05/06/24 100 lb (45.4 kg)  04/29/24 99 lb (44.9 kg)  04/04/24 100 lb (45.4 kg)  03/31/24 100 lb (45.4 kg)  03/05/24 100 lb 3.2 oz (45.5 kg)   Constitutional: thin, in NAD Eyes:  EOMI, no exophthalmos ENT: no neck masses, no cervical lymphadenopathy Cardiovascular: RRR, No MRG Respiratory: CTA B Musculoskeletal: no deformities Skin:no rashes Neurological: no tremor with outstretched hands  Assessment: 1. Osteoporosis  2. Vit D insufficiency  Plan: 1. Osteoporosis - Likely postmenopausal/age-related and she also has a family history of osteoporosis - She initially agreed to start Prolia,  but this was too expensive so we ended up starting Fosamax  70 mg weekly (2018).  She tolerates this well, without hip/thigh/jaw pain. - She previously had pain in the left shin and was found to have  an osteoma with some muscle edema around it.  Pain improved afterwards. - Reviewing the bone density report from 08/2020, this showed improved T-scores at the right femoral neck but still in the osteoporotic range and slightly low T-scores in the spine.  The left femoral neck T-scores were stable.  In 09/2022, her bone density report showed essentially stable T-scores, but still in the osteoporotic range.  She has another bone density scan pending. - She completed 7 years of Fosamax .  Will make a decision whether to continue this or not after the results of the new bone density scan return - No falls or fractures since last visit.  We again discussed about fall precautions. - We discussed about the need for weightbearing exercises (I previously also recommended adding weights when walking, walking downhill, walking after, rather than before meal).  At last visit she was mostly walking and I recommended to exercise 30 minutes a day for at least 5 days a week.  She now does an exercise class twice a week and we discussed that ideally she would increase strength exercises.  I recommended a [facility].  She will look into this. - Latest total calcium  was slightly elevated, while BUN was also slightly high open testing below).  Will check ionized calcium  and parathyroid  hormone today. Lab Results  Component Value Date   CALCIUM  10.7 (H) 04/29/2024   Lab Results  Component Value Date   BUN 32 (H) 04/29/2024   Lab Results  Component Value Date   CREATININE 0.74 04/29/2024  -I will see her back in a year.  2. Vit D insufficiency - She is on the following supplement: Nature Made, 2 softgels totaling 2,000 IUs each evening. Each softgel is 1,000 IUs or 25 mcg.   - Latest vitamin D  level was normal 03/13/2024: 42.9 - We will recheck her vitamin D  level today - She needs to lay down for labs (2/2 nausea).  Orders Placed This Encounter  Procedures   DG Bone Density   Parathyroid  hormone,  intact (no Ca)   Calcium , ionized   VITAMIN D  25 Hydroxy (Vit-D Deficiency, Fractures)   Phosphorus   Lela Fendt, MD PhD [facility]

## 2024-09-29 NOTE — Patient Instructions (Addendum)
 Please call and schedule bone density scan at the [facility] Office: [phone].   Please stop at the lab.  You should have an endocrinology follow-up appointment in 1 year.

## 2024-10-01 LAB — CALCIUM, IONIZED: Calcium, Ion: 5.2 mg/dL (ref 4.7–5.5)

## 2024-10-01 LAB — PHOSPHORUS: Phosphorus: 3.3 mg/dL (ref 2.1–4.3)

## 2024-10-01 LAB — PARATHYROID HORMONE, INTACT (NO CA): PTH: 62 pg/mL (ref 16–77)

## 2024-10-01 LAB — VITAMIN D 25 HYDROXY (VIT D DEFICIENCY, FRACTURES): Vit D, 25-Hydroxy: 42 ng/mL (ref 30–100)

## 2024-10-02 ENCOUNTER — Ambulatory Visit: Payer: Self-pay | Admitting: Internal Medicine

## 2024-10-08 ENCOUNTER — Encounter: Payer: Self-pay | Admitting: Gastroenterology

## 2024-10-13 ENCOUNTER — Inpatient Hospital Stay: Admission: RE | Admit: 2024-10-13 | Discharge: 2024-10-13 | Attending: Internal Medicine | Admitting: Internal Medicine

## 2024-10-28 LAB — OPHTHALMOLOGY REPORT-SCANNED

## 2024-10-29 ENCOUNTER — Encounter: Payer: Self-pay | Admitting: Internal Medicine

## 2024-11-03 DIAGNOSIS — H353132 Nonexudative age-related macular degeneration, bilateral, intermediate dry stage: Secondary | ICD-10-CM

## 2024-11-03 DIAGNOSIS — H539 Unspecified visual disturbance: Secondary | ICD-10-CM

## 2024-11-03 NOTE — Telephone Encounter (Signed)
" °  Ok to refer  if ongoing care and document diagnosis ask patient pr see if in record  (Dr elner is a retinal specialist.) "

## 2024-11-03 NOTE — Telephone Encounter (Signed)
 Copied from CRM #[id number]. Topic: Referral - Status >> Nov 03, 2024  2:26 PM Harlene ORN wrote: Reason for CRM: due to new 2026 referral requirement, they do need a refer from her PCP to see a specialist., She is scheduled to see Dr. Arley Legacy office on 11/06/2024. Fax: [phone] Phone: [phone]

## 2024-11-04 NOTE — Addendum Note (Signed)
 Addended by: Leitha Hyppolite on: 11/04/2024 05:07 PM   Modules accepted: Orders

## 2024-11-04 NOTE — Telephone Encounter (Signed)
 Spoke to pt. Pt reports she was referral to Dr Mardelle by Dr. Octavia for Macula Degenerative. Referral is placed.

## 2024-11-05 ENCOUNTER — Ambulatory Visit

## 2024-11-05 DIAGNOSIS — Z79899 Other long term (current) drug therapy: Secondary | ICD-10-CM

## 2024-11-05 NOTE — Progress Notes (Signed)
 "  11/05/2024 Name: [name] [name] MRN: [id number] DOB: 1951/03/17   [name] [name] is a 74 y.o. year old female who was referred for medication management by their primary care provider, [name] [name]. They presented for a face to face visit today.   They were referred to the pharmacist by their PCP for assistance in managing complex medication management    Subjective:  Care Team: Primary Care Provider: [name], [name] [name], [name]  Medication Access/Adherence  Current Pharmacy:  [facility]) - [name], [name] [name] - [address] [facility] [address] [place] [address] [place] [address] Phone: [phone] Fax: [phone]  [facility] - [place], [place] - [address] [place]  [address] Phone: [phone] Fax: [phone]  [facility] [address] [place], [place] - [address] [place] [place] [address] Phone: [phone] Fax: [phone]   Patient reports affordability concerns with their medications: No  Patient reports access/transportation concerns to their pharmacy: No  Patient reports adherence concerns with their medications:  No     Atrial Fibrillation:  Current medications: Rate Control: Metoprolol  tartrate 50mg  1 tab BID Rhythm Control: Dofetilide  1 BID, Diltiazem  300mg  1 every day, Diltiazem  30mg  q4h prn breakthrough afib Anticoagulation Regimen: Eliquis  5mg  BID  Medications tried in the past: Flecainide , Propranolol   CHADS2VASC: 3  Reports controlled BP at home and well controlled HR. Has had one a fib episode in the last 6 weeks.  Patient denies hypotensive s/sx including dizziness, lightheadedness. Patient denies hypertensive symptoms including headache, chest pain, shortness of breath   Current medication access support: Tikosyn  on GoodRx     Osteoporosis:  Current medications: Alendronate  70mg  once weekly Medications tried in the past:  None  Current supplements: Vitamin D  2000 units daily, Calcium  - from her diet    Most recent DEXA results: 10/15/24 Current medication access support: None  Endo continues fosamax  use  Nexium  Use: -Has completed course, endoscopy scheduled later this month   Objective:   Lab Results  Component Value Date   CREATININE 0.74 04/29/2024   BUN 32 (H) 04/29/2024   NA 144 04/29/2024   [name] 4.6 04/29/2024   CL 105 04/29/2024   CO2 23 04/29/2024    Lab Results  Component Value Date   CHOL 146 03/13/2024   HDL 72.60 03/13/2024   LDLCALC 59 03/13/2024   LDLDIRECT 131.0 09/03/2009   TRIG 75.0 03/13/2024   CHOLHDL 2 03/13/2024    Medications Reviewed Today     Reviewed by [name] [name] (Pharmacist) on 11/05/24 at 1356  Med List Status: <None>   Medication Order Taking? Sig Documenting Provider Last Dose Status Informant  acetaminophen  (TYLENOL ) 500 MG tablet [id number]  Take 325-500 mg by mouth as needed for moderate pain (pain score 4-6).  Patient taking differently: Take 500 mg by mouth every 6 (six) hours as needed for moderate pain (pain score 4-6).   Provider, Historical, [name]  Active Self           Med Note ([name], [name] A   Mon Mar 31, 2024 10:03 AM)    alendronate  (FOSAMAX ) 70 MG tablet [id number]  TAKE 1 TABLET BY MOUTH WEEKLY  WITH A FULL GLASS OF WATER ON AN EMPTY STOMACH [name] [name]  Active   Cholecalciferol (VITAMIN D3) 1000 units CAPS [id number]  Take 1,000 Units by mouth daily.  Patient taking differently: Take 2,000 Units by mouth  daily.   Provider, Historical, [name]  Active Self  Coenzyme Q10 (COQ10) 100 MG CAPS [id number]  Take 100 mg by mouth daily with supper. Provider, Historical, [name]  Active Self  diltiazem  (CARDIZEM ) 30 MG tablet [id number]  TAKE 1 TABLET EVERY 4 HOURS AS NEEDED FOR BREAKTHROUGH AFIB [name] [name]  Active Self           Med Note ([name], [name] A   Mon Mar 31, 2024 10:03 AM)    diltiazem  (TIAZAC ) 300 MG 24 hr capsule  [id number]  Take 1 capsule (300 mg total) by mouth daily. [name] [name]  Active   dofetilide  (TIKOSYN ) 250 MCG capsule [id number]  TAKE 1 CAPSULE BY MOUTH 2 TIMES DAILY. [name] [name]  Active   ELIQUIS  5 MG TABS tablet [id number]  TAKE 1 TABLET BY MOUTH TWICE  DAILY [name] [name]  Active   esomeprazole  (NEXIUM ) 40 MG capsule [id number]  Take 1 capsule (40 mg total) by mouth 2 (two) times daily before a meal. Twice daily for 2 months then once daily [name] [name]  Active   LORazepam  (ATIVAN ) 0.5 MG tablet [id number]  Take 1 tablet (0.5 mg total) by mouth daily as needed for anxiety (when flying). [name] [name]  Active   metoprolol  tartrate (LOPRESSOR ) 50 MG tablet [id number]  TAKE 1 TABLET BY MOUTH TWICE  DAILY [name] [name]  Active   Multiple Vitamins-Minerals (PRESERVISION AREDS PO) [id number]  Take 1 capsule by mouth in the morning and at bedtime. Provider, Historical, [name]  Active Self  potassium chloride  (KLOR-CON ) 10 MEQ tablet [id number]  TAKE 2 TABLETS BY MOUTH DAILY [name] [name]  Active   pravastatin  (PRAVACHOL ) 20 MG tablet [id number]  TAKE 1 TABLET BY MOUTH IN THE  KARNA [name] [name]  Active               Assessment/Plan:   Atrial Fibrillation: - Currently controlled - Reviewed importance of adherence to anticoagulant for stroke prevention. - Reviewed appropriate blood pressure monitoring technique and reviewed goal blood pressure. Recommended to check home blood pressure and heart rate daily -Continue current medication therapy.  Osteoporosis: - Currently appropriately managed - Reviewed recommendation for daily calcium  intake of 1200 mg from diet and vitamin D  intake of [phone] units - Reviewed benefits of weight bearing exercise -Continue medication therapy per endo  Nexium  Use -Has completed course prescribed by GI. Has endoscopy scheduled later this month to determine next steps if any needed   Follow Up Plan: 6  months  [name] [name], PharmD Clinical Pharmacist [phone]    "

## 2024-11-06 ENCOUNTER — Encounter: Payer: Self-pay | Admitting: Cardiology

## 2024-11-07 ENCOUNTER — Telehealth: Payer: Self-pay

## 2024-11-07 ENCOUNTER — Ambulatory Visit

## 2024-11-07 VITALS — Ht 63.0 in | Wt 100.8 lb

## 2024-11-07 DIAGNOSIS — Z8711 Personal history of peptic ulcer disease: Secondary | ICD-10-CM

## 2024-11-07 DIAGNOSIS — Z8719 Personal history of other diseases of the digestive system: Secondary | ICD-10-CM

## 2024-11-07 NOTE — Telephone Encounter (Signed)
 Dr. Wilhelmenia,   Are you ok with proceeding with the 2 day hold for eliquis  for this patient from her prior procedure or would you like a new request to be sent.   Thank you

## 2024-11-07 NOTE — Telephone Encounter (Signed)
 Since it will be nearly 40-months since last procedure, I would go ahead and get hold for Eliquis  approval (should be straightforward I hope). Thanks. GM

## 2024-11-07 NOTE — Progress Notes (Signed)
 PCP MD at time of PV: Apolinar Eastern, MD  __________________________________________________________________________________________________________________________________________  No egg allergy known to patient  No soy allergy known to patient No issues known to pt with past sedation with any surgeries or procedures Patient denies ever being told they had issues or difficulty with intubation  No FH of Malignant Hyperthermia Pt is not on diet pills Pt is not on  home 02  Pt is taking Eliquis  5 mg twice a day  No A fib or A flutter Have any cardiac testing pending -- no  LOA: independent  No Chew or Snuff tobacco __________________________________________________________________________________________________________________________________________  Patient's chart reviewed by Norleen Schillings CNRA prior to previsit and patient appropriate for the [facility].  Previsit completed and red dot placed by patient's name on their procedure day (on provider's schedule).

## 2024-11-07 NOTE — Telephone Encounter (Signed)
 Good afternoon,   Can one of you please assist with getting clearance for this patient to hold her Eliquis .   Thank you

## 2024-11-07 NOTE — Telephone Encounter (Signed)
 Request for clearance routed to [facility]. See separate telephone encounter.

## 2024-11-07 NOTE — Telephone Encounter (Signed)
 [facility] Pre-operative Risk Assessment     Request for surgical clearance:     Endoscopy Procedure  What type of surgery is being performed?     EGD  When is this surgery scheduled?     11/21/24  What type of clearance is required ?   Pharmacy  Are there any medications that need to be held prior to surgery and how long? Eliquis  5 mg hold 2 days  Practice name and name of physician performing surgery?      [facility]  What is your office phone and fax number?      Phone- [phone]  Fax- [phone]  Anesthesia type (None, local, MAC, general) ?       MAC   Please route your response to Almarie Goo, LPN

## 2024-11-07 NOTE — Telephone Encounter (Signed)
Clinical pharmacist to review Eliquis 

## 2024-11-10 NOTE — Telephone Encounter (Signed)
 Patient with diagnosis of afib on Eliquis  for anticoagulation.    Procedure: EGD  Date of procedure: 11/21/24   CHA2DS2-VASc Score = 4   This indicates a 4.8% annual risk of stroke. The patient's score is based upon: CHF History: 0 HTN History: 1 Diabetes History: 0 Stroke History: 0 Vascular Disease History: 1 Age Score: 1 Gender Score: 1      CrCl 49 ml/min Platelet count 194  Patient has not had an Afib/aflutter ablation in the last 3 months, DCCV within the last 4 weeks or a watchman implanted in the last 45 days   Per office protocol, patient can hold Eliquis  for 2 days prior to procedure.    **This guidance is not considered finalized until pre-operative APP has relayed final recommendations.**

## 2024-11-10 NOTE — Telephone Encounter (Signed)
" ° °  Name: [name] [name]  DOB: [birth year]  MRN: [id number]  Primary Cardiologist: Lynwood Schilling, MD   Preoperative team, please contact this patient and set up a phone call appointment for further preoperative risk assessment. Please obtain consent and complete medication review. Thank you for your help.  Per office protocol, patient can hold Eliquis  for 2 days prior to procedure. -pharmD  I also confirmed the patient resides in the state of [place] . As per [facility] Medical Board telemedicine laws, the patient must reside in the state in which the provider is licensed.   [name], NP 11/10/2024, 11:48 AM [facility]    "

## 2024-11-10 NOTE — Telephone Encounter (Signed)
 Patient scheduled for pre-op clearance on 11/13/24 with Lum Louis, NP. She is very worried that the requesting office will not see the clearance once done. I reassured her everything will be done accordingly. She verbalized she understood.       Patient Consent for Virtual Visit        [name] [name] has provided verbal consent on 11/10/2024 for a virtual visit (video or telephone).   CONSENT FOR VIRTUAL VISIT FOR:  [name] [name]  By participating in this virtual visit I agree to the following:  I hereby voluntarily request, consent and authorize [facility] and its employed or contracted physicians, physician assistants, nurse practitioners or other licensed health care professionals (the Practitioner), to provide me with telemedicine health care services (the Services) as deemed necessary by the treating Practitioner. I acknowledge and consent to receive the Services by the Practitioner via telemedicine. I understand that the telemedicine visit will involve communicating with the Practitioner through live audiovisual communication technology and the disclosure of certain medical information by electronic transmission. I acknowledge that I have been given the opportunity to request an in-person assessment or other available alternative prior to the telemedicine visit and am voluntarily participating in the telemedicine visit.  I understand that I have the right to withhold or withdraw my consent to the use of telemedicine in the course of my care at any time, without affecting my right to future care or treatment, and that the Practitioner or I may terminate the telemedicine visit at any time. I understand that I have the right to inspect all information obtained and/or recorded in the course of the telemedicine visit and may receive copies of available information for a reasonable fee.  I understand that some of the potential risks of receiving the Services via telemedicine include:   Delay or interruption in medical evaluation due to technological equipment failure or disruption; Information transmitted may not be sufficient (e.g. poor resolution of images) to allow for appropriate medical decision making by the Practitioner; and/or  In rare instances, security protocols could fail, causing a breach of personal health information.  Furthermore, I acknowledge that it is my responsibility to provide information about my medical history, conditions and care that is complete and accurate to the best of my ability. I acknowledge that Practitioner's advice, recommendations, and/or decision may be based on factors not within their control, such as incomplete or inaccurate data provided by me or distortions of diagnostic images or specimens that may result from electronic transmissions. I understand that the practice of medicine is not an exact science and that Practitioner makes no warranties or guarantees regarding treatment outcomes. I acknowledge that a copy of this consent can be made available to me via my patient portal [facility] MyChart), or I can request a printed copy by calling the office of [facility].    I understand that my insurance will be billed for this visit.   I have read or had this consent read to me. I understand the contents of this consent, which adequately explains the benefits and risks of the Services being provided via telemedicine.  I have been provided ample opportunity to ask questions regarding this consent and the Services and have had my questions answered to my satisfaction. I give my informed consent for the services to be provided through the use of telemedicine in my medical care

## 2024-11-10 NOTE — Telephone Encounter (Signed)
 Older message   see record review

## 2024-11-13 ENCOUNTER — Ambulatory Visit: Attending: Cardiology | Admitting: Emergency Medicine

## 2024-11-13 DIAGNOSIS — Z01810 Encounter for preprocedural cardiovascular examination: Secondary | ICD-10-CM | POA: Diagnosis not present

## 2024-11-13 NOTE — Progress Notes (Signed)
 "   Virtual Visit via Telephone Note   Because of Aanyah D Dembeck co-morbid illnesses, she is at least at moderate risk for complications without adequate follow up.  This format is felt to be most appropriate for this patient at this time.  Due to technical limitations with video connection web designer), today's appointment will be conducted as an audio only telehealth visit, and SPENSER CONG verbally agreed to proceed in this manner.   All issues noted in this document were discussed and addressed.  No physical exam could be performed with this format.  Evaluation Performed:  Preoperative cardiovascular risk assessment _____________   Date:  11/13/2024   Patient ID:  IMYA MANCE, DOB Oct 19, 1951, MRN [id number] Patient Location:  Home Provider location:   Office  Primary Care Provider:  Charlett Apolinar POUR, MD Primary Cardiologist:  Lynwood Schilling, MD  Chief Complaint / Patient Profile   74 y.o. y/o female with a h/o paroxysmal atrial fibrillation, atypical atrial flutter, s/p multiple ablations, hypertension, hyperlipidemia, coronary artery disease, ascending aortic dilation, aortic insufficiency, tricuspid valve regurgitation who is pending EGD on 11/21/2024 with [facility] gastroenterology and presents today for telephonic preoperative cardiovascular risk assessment.  History of Present Illness    Herbert Aguinaldo Helvey is a 74 y.o. female who presents via audio/video conferencing for a telehealth visit today.  Pt was last seen in cardiology clinic on 05/16/2024 by EP on 05/16/2024.  At that time CHARESE ABUNDIS was doing well with EKG showing NSR with stable intervals.  She followed up with atrial fibrillation clinic on 07/14/2024 and remained in sinus rhythm and she was continued on present therapy.  The patient is now pending procedure as outlined above. Since her last visit, she is doing well without acute cardiovascular concerns or complaints.  She reports that she is without symptoms of recurrent atrial  fibrillation today.  She does feel like she goes into A-fib for a few hours about every 6 weeks and self converts.  She stays active and is participating in physical therapy currently without symptoms of chest discomfort, dyspnea, or syncope.  She is without any exertional symptoms.  No symptoms to suggest active angina.  She is able to complete greater than 4 METS.  Past Medical History    Past Medical History:  Diagnosis Date   Allergic rhinitis    Aortic insufficiency    CAT SCRATCH 09/10/2009   Qualifier: Diagnosis of  By: Charlett MD, Apolinar POUR    Clavicle fracture 12/2006   left   Diverticulosis    HTN (hypertension)    Osteopenia    Paroxysmal atrial fibrillation (HCC)    Pneumonia    PONV (postoperative nausea and vomiting)    Vision disturbance 03/13/2013   Negative neuro MRA MRI   Past Surgical History:  Procedure Laterality Date   ABDOMINAL HYSTERECTOMY     pt denies 04/15/14   APPENDECTOMY     ATRIAL FIBRILLATION ABLATION N/A 02/01/2021   Procedure: ATRIAL FIBRILLATION ABLATION;  Surgeon: Kelsie Lynwood, MD;  Location: [facility] INVASIVE CV LAB;  Service: Cardiovascular;  Laterality: N/A;   ATRIAL FIBRILLATION ABLATION N/A 09/03/2023   Procedure: ATRIAL FIBRILLATION ABLATION;  Surgeon: Inocencio Soyla Lunger, MD;  Location: [facility] INVASIVE CV LAB;  Service: Cardiovascular;  Laterality: N/A;   BRONCHIAL WASHINGS  09/05/2021   Procedure: BRONCHIAL WASHINGS;  Surgeon: Shelah Lamar RAMAN, MD;  Location: [facility] ENDOSCOPY;  Service: Cardiopulmonary;;   ESOPHAGOGASTRODUODENOSCOPY N/A 06/05/2024   Procedure: EGD (ESOPHAGOGASTRODUODENOSCOPY);  Surgeon: Wilhelmenia Aloha Raddle., MD;  Location: [facility] ENDOSCOPY;  Service: Gastroenterology;  Laterality: N/A;   EUS N/A 06/05/2024   Procedure: ULTRASOUND, UPPER GI TRACT, ENDOSCOPIC;  Surgeon: Wilhelmenia Aloha Raddle., MD;  Location: [facility] ENDOSCOPY;  Service: Gastroenterology;  Laterality: N/A;   LAPAROSCOPIC SALPINGO OOPHERECTOMY Right    TONSILLECTOMY AND ADENOIDECTOMY      VIDEO BRONCHOSCOPY N/A 09/05/2021   Procedure: VIDEO BRONCHOSCOPY WITHOUT FLUORO;  Surgeon: Shelah Lamar RAMAN, MD;  Location: [facility] ENDOSCOPY;  Service: Cardiopulmonary;  Laterality: N/A;    Allergies  Allergies[1]  Home Medications    Prior to Admission medications  Medication Sig Start Date End Date Taking? Authorizing Provider  acetaminophen  (TYLENOL ) 500 MG tablet Take 325-500 mg by mouth as needed for moderate pain (pain score 4-6). Patient taking differently: Take 500 mg by mouth every 6 (six) hours as needed for moderate pain (pain score 4-6).    [provider]  alendronate  (FOSAMAX ) 70 MG tablet TAKE 1 TABLET BY MOUTH WEEKLY  WITH A FULL GLASS OF WATER ON AN EMPTY STOMACH 04/18/24   Trixie File, MD  Cholecalciferol (VITAMIN D3) 1000 units CAPS Take 1,000 Units by mouth daily. Patient taking differently: Take 2,000 Units by mouth daily.    [provider]  Coenzyme Q10 (COQ10) 100 MG CAPS Take 100 mg by mouth daily with supper. 05/31/22   [provider]  diltiazem  (CARDIZEM ) 30 MG tablet TAKE 1 TABLET EVERY 4 HOURS AS NEEDED FOR BREAKTHROUGH AFIB 05/21/23   [name], [name], PA  diltiazem  (TIAZAC ) 300 MG 24 hr capsule Take 1 capsule (300 mg total) by mouth daily. 03/25/24   [name], MD  dofetilide  (TIKOSYN ) 250 MCG capsule TAKE 1 CAPSULE BY MOUTH 2 TIMES DAILY. 04/01/24   [name], PA-C  ELIQUIS  5 MG TABS tablet TAKE 1 TABLET BY MOUTH TWICE  DAILY 06/09/24   [name], MD  esomeprazole  (NEXIUM ) 40 MG capsule Take 1 capsule (40 mg total) by mouth 2 (two) times daily before a meal. Twice daily for 2 months then once daily 06/05/24 06/05/25  [name], Aloha Raddle., MD  LORazepam  (ATIVAN ) 0.5 MG tablet Take 1 tablet (0.5 mg total) by mouth daily as needed for anxiety (when flying). 12/14/23   [name], [name], MD  metoprolol  tartrate (LOPRESSOR ) 50 MG tablet TAKE 1 TABLET BY MOUTH TWICE  DAILY 09/03/24   [name], [name], PA  Multiple  Vitamins-Minerals (PRESERVISION AREDS PO) Take 1 capsule by mouth in the morning and at bedtime.    [provider]  potassium chloride  (KLOR-CON ) 10 MEQ tablet TAKE 2 TABLETS BY MOUTH DAILY 03/14/24   [name], MD  pravastatin  (PRAVACHOL ) 20 MG tablet TAKE 1 TABLET BY MOUTH IN THE  EVENING 09/23/24   [name], MD    Physical Exam    Vital Signs:  BALERIA WYMAN does not have vital signs available for review today.  Given telephonic nature of communication, physical exam is limited. AAOx3. NAD. Normal affect.  Speech and respirations are unlabored.  Accessory Clinical Findings    None  Assessment & Plan    1.  Preoperative Cardiovascular Risk Assessment: According to the Revised Cardiac Risk Index (RCRI), her Perioperative Risk of Major Cardiac Event is (%): 0.4. Her Functional Capacity in METs is: 5.07 according to the Duke Activity Status Index (DASI). Therefore, based on ACC/AHA guidelines, patient would be at acceptable risk for the planned procedure without further cardiovascular testing. I will route this recommendation to the requesting party via Epic fax function.  The  patient was advised that if she develops new symptoms prior to surgery to contact our office to arrange for a follow-up visit, and she verbalized understanding.  Per office protocol, patient can hold Eliquis  for 2 days prior to procedure.    A copy of this note will be routed to requesting surgeon.  Time:   Today, I have spent 10 minutes with the patient with telehealth technology discussing medical history, symptoms, and management plan.     Lum LITTIE Louis, NP  11/13/2024, 2:35 PM     [1]  Allergies Allergen Reactions   Latex Hives   Other Rash and Other (See Comments)    Adhesive pads left on skin for extended periods = BURNS AND RASHES ON THE SKIN!!!!   Codeine Phosphate Other (See Comments)    Drowsiness    Demerol [Meperidine] Nausea And Vomiting   Protonix  [Pantoprazole ]  Rash   Sulfa  Antibiotics Rash   Tens Therapy Replace Back Pads Itching and Rash   "

## 2024-11-14 ENCOUNTER — Encounter: Payer: Self-pay | Admitting: Gastroenterology

## 2024-11-18 ENCOUNTER — Ambulatory Visit (HOSPITAL_COMMUNITY)
Admission: RE | Admit: 2024-11-18 | Discharge: 2024-11-18 | Disposition: A | Discharge status: Home / Self Care | Source: Ambulatory Visit | Attending: Physician Assistant | Admitting: Physician Assistant

## 2024-11-18 VITALS — BP 116/58 | HR 66 | Ht 63.0 in | Wt 100.4 lb

## 2024-11-18 DIAGNOSIS — I4891 Unspecified atrial fibrillation: Secondary | ICD-10-CM | POA: Diagnosis not present

## 2024-11-18 DIAGNOSIS — D6869 Other thrombophilia: Secondary | ICD-10-CM

## 2024-11-18 DIAGNOSIS — Z79899 Other long term (current) drug therapy: Secondary | ICD-10-CM | POA: Diagnosis not present

## 2024-11-18 DIAGNOSIS — Z5181 Encounter for therapeutic drug level monitoring: Secondary | ICD-10-CM

## 2024-11-18 DIAGNOSIS — I480 Paroxysmal atrial fibrillation: Secondary | ICD-10-CM | POA: Diagnosis not present

## 2024-11-18 NOTE — Progress Notes (Signed)
 "  Primary Care Physician: Charlett Apolinar POUR, MD Referring Physician: Dr. Lavona EP: Dr. Inocencio Stephane JONETTA [name] [name] is a 74 y.o. female with a h/o HTN, paroxysmal afib, initially diagnosed with afib for several years. She is on rate control/flecainde  meds as well as eliquis  5 mg for a chadsvasc score  of 3. She is in the afib clinic to f/u ablation one month ago. She did have some episodes the second week of ablation but sine 18th of April she has not had anymore episodes. She did have an allergic reaction on her torso, probaly form the pads that were placed on chest for cardioversion. It has resolved. No swallowing or groin issues. She is back to her usual activities.    Follow up in the AF clinic 05/31/21. Patient presents for dofetilide  admission. She reports that she has had afib about every other day. She has stopped flecainide  3 days ago. She denies any missed doses of anticoagulation.   Follow up in the AF clinic 05/31/21. Patient is s/p dofetilide  admission 8/2-06/03/21. Patient reports she has not had any afib since starting the medication which is an improvement for her. She denies any bleeding issues on anticoagulation.   Follow up in the AF clinic 06/28/23. Patient reports that she has been having more frequent afib episodes, sometimes multiple episodes per day. There does not appear to be a specific trigger for these episodes. She keeps a log with her Kardia mobile device. She has symptoms of palpitations and fatigue.  Follow up in the AF clinic 10/01/23. Patient is s/p repeat afib ablation on 09/03/23 with Dr Inocencio. She reports that she has done well since the ablation with no episodes detected on her Kardia mobile. She denies chest pain or groin issues.   Follow up 02/20/24. Patient returns for follow up for atrial fibrillation and dofetilide  monitoring. She was seen by Dr Michele as a DOD add on 4/18 for increased afib episodes. She had two episodes back to back, one lasting 13 hours. Her BB was  increased. Since then, she has not had any further afib episodes. She feels slightly more fatigued on the higher dose but this is tolerable for her. There were no specific triggers for her afib. No bleeding issues on anticoagulation.   Follow up 07/14/24. Patient returns for follow up for atrial fibrillation and dofetilide  monitoring. She reports that in the past 2 weeks she has had 4-5 episodes of afib lasting 2-4 hours. There were no specific triggers she could identify but admits she was stressed with some other medical procedures (EGD and mole removal).   Follow up 11/18/24. Patient returns for follow up for atrial fibrillation and dofetilide  monitoring. She is in SR today and feels well. She states that she has an afib episode about once every 6 weeks which his brief. No bleeding issues on anticoagulation.   Today, she  denies symptoms of palpitations, chest pain, shortness of breath, orthopnea, PND, lower extremity edema, dizziness, presyncope, syncope, snoring, daytime somnolence, bleeding, or neurologic sequela. The patient is tolerating medications without difficulties and is otherwise without complaint today.    Past Medical History:  Diagnosis Date   Allergic rhinitis    Aortic insufficiency    CAT SCRATCH 09/10/2009   Qualifier: Diagnosis of  By: Charlett MD, Apolinar POUR    Clavicle fracture 12/2006   left   Diverticulosis    HTN (hypertension)    Osteopenia    Paroxysmal atrial fibrillation (HCC)  Pneumonia    PONV (postoperative nausea and vomiting)    Vision disturbance 03/13/2013   Negative neuro MRA MRI    Current Outpatient Medications  Medication Sig Dispense Refill   acetaminophen  (TYLENOL ) 500 MG tablet Take 325-500 mg by mouth as needed for moderate pain (pain score 4-6). (Patient taking differently: Take 650 mg by mouth as needed for moderate pain (pain score 4-6).)     alendronate  (FOSAMAX ) 70 MG tablet TAKE 1 TABLET BY MOUTH WEEKLY  WITH A FULL GLASS OF WATER ON AN  EMPTY STOMACH 12 tablet 3   Cholecalciferol (VITAMIN D3) 1000 units CAPS Take 1,000 Units by mouth daily. (Patient taking differently: Take 2,000 Units by mouth daily.)     Coenzyme Q10 (COQ10) 100 MG CAPS Take 100 mg by mouth daily with supper.     diltiazem  (CARDIZEM ) 30 MG tablet TAKE 1 TABLET EVERY 4 HOURS AS NEEDED FOR BREAKTHROUGH AFIB 45 tablet 1   diltiazem  (TIAZAC ) 300 MG 24 hr capsule Take 1 capsule (300 mg total) by mouth daily. 5 capsule 0   dofetilide  (TIKOSYN ) 250 MCG capsule TAKE 1 CAPSULE BY MOUTH 2 TIMES DAILY. 180 capsule 2   ELIQUIS  5 MG TABS tablet TAKE 1 TABLET BY MOUTH TWICE  DAILY 200 tablet 2   LORazepam  (ATIVAN ) 0.5 MG tablet Take 1 tablet (0.5 mg total) by mouth daily as needed for anxiety (when flying). (Patient taking differently: Take 0.5 mg by mouth as needed for anxiety (when flying).) 20 tablet 0   metoprolol  tartrate (LOPRESSOR ) 50 MG tablet TAKE 1 TABLET BY MOUTH TWICE  DAILY 200 tablet 2   Multiple Vitamins-Minerals (PRESERVISION AREDS PO) Take 1 capsule by mouth in the morning and at bedtime.     potassium chloride  (KLOR-CON ) 10 MEQ tablet TAKE 2 TABLETS BY MOUTH DAILY 200 tablet 2   pravastatin  (PRAVACHOL ) 20 MG tablet TAKE 1 TABLET BY MOUTH IN THE  EVENING 100 tablet 2   esomeprazole  (NEXIUM ) 40 MG capsule Take 1 capsule (40 mg total) by mouth 2 (two) times daily before a meal. Twice daily for 2 months then once daily (Patient not taking: Reported on 11/18/2024) 60 capsule 12   No current facility-administered medications for this encounter.    ROS- All systems are reviewed and negative except as per the HPI above  Physical Exam: Vitals:   11/18/24 1013  BP: (!) 116/58  Pulse: 66  Weight: 45.5 kg  Height: 5' 3 (1.6 m)     Wt Readings from Last 3 Encounters:  11/18/24 45.5 kg  11/07/24 45.7 kg  09/29/24 45.2 kg    GEN: Well nourished, well developed in no acute distress CARDIAC: Regular rate and rhythm, no rubs, gallops. 2/6 systolic  murmur RESPIRATORY:  Clear to auscultation without rales, wheezing or rhonchi  ABDOMEN: Soft, non-tender, non-distended EXTREMITIES:  No edema; No deformity    EKG Interpretation Date/Time:  Tuesday November 18 2024 10:30:33 EST Ventricular Rate:  66 PR Interval:  188 QRS Duration:  82 QT Interval:  444 QTC Calculation: 465 R Axis:   56  Text Interpretation: Normal sinus rhythm Cannot rule out Anterior infarct , age undetermined Abnormal ECG When compared with ECG of 14-Jul-2024 09:35, No significant change was found Confirmed by Ilir Mahrt ([id number]) on 11/18/2024 10:33:39 AM    Echo 02/29/24  1. Left ventricular ejection fraction, by estimation, is 60 to 65%. The  left ventricle has normal function. The left ventricle has no regional  wall motion abnormalities. Left ventricular diastolic  parameters are  indeterminate.   2. Right ventricular systolic function is normal. The right ventricular  size is normal. There is normal pulmonary artery systolic pressure. The  estimated right ventricular systolic pressure is 26.2 mmHg.   3. Left atrial size was moderately dilated.   4. Right atrial size was severely dilated.   5. The mitral valve is normal in structure. No evidence of mitral valve  regurgitation. No evidence of mitral stenosis.   6. Tricuspid valve regurgitation is moderate.   7. The aortic valve is calcified. There is mild calcification of the  aortic valve. There is mild thickening of the aortic valve. Aortic valve  regurgitation is moderate. Aortic valve sclerosis is present, with no  evidence of aortic valve stenosis. Aortic regurgitation PHT measures 550 msec. Aortic valve area, by VTI measures 2.56 cm. Aortic valve mean gradient measures 7.0 mmHg. Aortic valve Vmax measures 1.80 m/s.   8. The inferior vena cava is normal in size with greater than 50%  respiratory variability, suggesting right atrial pressure of 3 mmHg.   Comparison(s): No significant change from prior  study.    CHA2DS2-VASc Score = 4  The patient's score is based upon: CHF History: 0 HTN History: 1 Diabetes History: 0 Stroke History: 0 Vascular Disease History: 1 Age Score: 1 Gender Score: 1       ASSESSMENT AND PLAN: Paroxysmal Atrial Fibrillation (ICD10:  I48.0) The patient's CHA2DS2-VASc score is 4, indicating a 4.8% annual risk of stroke.   S/p afib ablation 02/01/21 and 09/03/23 S/p dofetilide  loading 05/2021 Patient appears to be maintaining SR with infrequent episodes. Continue dofetilide  250 mcg BID Continue Eliquis  5 mg BID Continue Lopressor  50 mg BID. May take 1/2 tablet PRN for heart racing. Continue diltiazem  300 mg daily   Secondary Hypercoagulable State (ICD10:  D68.69) The patient is at significant risk for stroke/thromboembolism based upon her CHA2DS2-VASc Score of 4.  Continue Apixaban  (Eliquis ). No bleeding issues.   High Risk Medication Monitoring (ICD 10: [id number]) Patient requires ongoing monitoring for anti-arrhythmic medication which has the potential to cause life threatening arrhythmias. QT interval on ECG acceptable for dofetilide  monitoring. Check bmet/mag today.     HTN Stable on current regimen  CAD No anginal symptoms Followed by Dr Lavona   Follow up in the AF clinic in 6 months.    Daril Kicks PA-C [facility] [address] [place], [place] [address] [phone] "

## 2024-11-19 ENCOUNTER — Encounter: Payer: Self-pay | Admitting: Gastroenterology

## 2024-11-19 ENCOUNTER — Telehealth: Payer: Self-pay | Admitting: Cardiology

## 2024-11-19 ENCOUNTER — Ambulatory Visit (HOSPITAL_COMMUNITY): Payer: Self-pay | Admitting: Physician Assistant

## 2024-11-19 ENCOUNTER — Telehealth: Payer: Self-pay

## 2024-11-19 LAB — BASIC METABOLIC PANEL WITH GFR
BUN/Creatinine Ratio: 43 — ABNORMAL HIGH (ref 12–28)
BUN: 28 mg/dL — ABNORMAL HIGH (ref 8–27)
CO2: 24 mmol/L (ref 20–29)
Calcium: 9.4 mg/dL (ref 8.7–10.3)
Chloride: 104 mmol/L (ref 96–106)
Creatinine, Ser: 0.65 mg/dL (ref 0.57–1.00)
Glucose: 87 mg/dL (ref 70–99)
Potassium: 4.4 mmol/L (ref 3.5–5.2)
Sodium: 142 mmol/L (ref 134–144)
eGFR: 93 mL/min/1.73

## 2024-11-19 LAB — MAGNESIUM: Magnesium: 2.3 mg/dL (ref 1.6–2.3)

## 2024-11-19 NOTE — Telephone Encounter (Signed)
 Returned patient's phone call. Discussed alendronate . She normally takes on Friday (the day of the procedure), but plans to hold the tablet. Wants to know when she can resume. Advised to discuss with GI day of procedure but unless they say otherwise, can resume that Saturday.

## 2024-11-19 NOTE — Telephone Encounter (Signed)
 Pt would like to switch from Dr Lavona to Dr Ren. Please advise.

## 2024-11-19 NOTE — Telephone Encounter (Signed)
 Copied from CRM #[id number]. Topic: General - Other >> Nov 19, 2024  9:49 AM Zebedee SAUNDERS wrote: Reason for CRM: Pt called if Jon Lindau would return pt's call to [phone] regarding alendronate  (FOSAMAX ) 70 MG tablet, having surgery on Friday Jan. 23, 2026.

## 2024-11-21 ENCOUNTER — Encounter: Payer: Self-pay | Admitting: Gastroenterology

## 2024-11-21 ENCOUNTER — Ambulatory Visit: Admitting: Gastroenterology

## 2024-11-21 VITALS — BP 120/56 | HR 59 | Temp 98.1°F | Resp 11 | Ht 63.0 in | Wt 100.8 lb

## 2024-11-21 DIAGNOSIS — K259 Gastric ulcer, unspecified as acute or chronic, without hemorrhage or perforation: Secondary | ICD-10-CM

## 2024-11-21 DIAGNOSIS — K2950 Unspecified chronic gastritis without bleeding: Secondary | ICD-10-CM

## 2024-11-21 DIAGNOSIS — Z8711 Personal history of peptic ulcer disease: Secondary | ICD-10-CM

## 2024-11-21 MED ORDER — OMEPRAZOLE 40 MG PO CPDR: 40.0000 mg | DELAYED_RELEASE_CAPSULE | Freq: Two times a day (BID) | ORAL | 3 refills | Status: AC

## 2024-11-21 MED ORDER — SODIUM CHLORIDE 0.9 % IV SOLN: 500.0000 mL | INTRAVENOUS | Status: AC

## 2024-11-21 NOTE — Progress Notes (Signed)
 Report given to PACU, vss

## 2024-11-21 NOTE — Progress Notes (Signed)
 Pt's states no medical or surgical changes since previsit or office visit.

## 2024-11-21 NOTE — Progress Notes (Signed)
 1143 Robinul 0.1 mg IV given due large amount of secretions upon assessment.  MD made aware, vss

## 2024-11-21 NOTE — Patient Instructions (Signed)
" ° ° °  Omeprazole  order sent to your pharmacy- see page 3  Office f/u visit for 2 months to be made -  recall placed   Recall placed for repeat Endoscopy put in for 4 months -  Await pathology results on biopsies done     Continue previous diet & medications  YOU HAD AN ENDOSCOPIC PROCEDURE TODAY AT THE [facility]:   Refer to the procedure report that was given to you for any specific questions about what was found during the examination.  If the procedure report does not answer your questions, please call your gastroenterologist to clarify.  If you requested that your care partner not be given the details of your procedure findings, then the procedure report has been included in a sealed envelope for you to review at your convenience later.  YOU SHOULD EXPECT: Some feelings of bloating in the abdomen. Passage of more gas than usual.  Walking can help get rid of the air that was put into your GI tract during the procedure and reduce the bloating. If you had a lower endoscopy (such as a colonoscopy or flexible sigmoidoscopy) you may notice spotting of blood in your stool or on the toilet paper. If you underwent a bowel prep for your procedure, you may not have a normal bowel movement for a few days.  Please Note:  You might notice some irritation and congestion in your nose or some drainage.  This is from the oxygen used during your procedure.  There is no need for concern and it should clear up in a day or so.  SYMPTOMS TO REPORT IMMEDIATELY:   Following upper endoscopy (EGD)  Vomiting of blood or coffee ground material  New chest pain or pain under the shoulder blades  Painful or persistently difficult swallowing  New shortness of breath  Fever of 100F or higher  Black, tarry-looking stools  For urgent or emergent issues, a gastroenterologist can be reached at any hour by calling [phone]. Do not use MyChart messaging for urgent concerns.    DIET:  We do  recommend a small meal at first, but then you may proceed to your regular diet.  Drink plenty of fluids but you should avoid alcoholic beverages for 24 hours.  ACTIVITY:  You should plan to take it easy for the rest of today and you should NOT DRIVE or use heavy machinery until tomorrow (because of the sedation medicines used during the test).    FOLLOW UP: Our staff will call the number listed on your records the next business day following your procedure.  We will call around 7:15- 8:00 am to check on you and address any questions or concerns that you may have regarding the information given to you following your procedure. If we do not reach you, we will leave a message.     If any biopsies were taken you will be contacted by phone or by letter within the next 1-3 weeks.  Please call us  at [phone] if you have not heard about the biopsies in 3 weeks.    SIGNATURES/CONFIDENTIALITY: You and/or your care partner have signed paperwork which will be entered into your electronic medical record.  These signatures attest to the fact that that the information above on your After Visit Summary has been reviewed and is understood.  Full responsibility of the confidentiality of this discharge information lies with you and/or your care-partner.           "

## 2024-11-21 NOTE — Op Note (Addendum)
 [facility] Patient Name: [name] [name] Procedure Date: 11/21/2024 11:25 AM MRN: [id number] Endoscopist: Aloha Finner , MD, [id number] Age: 74 Referring MD:  Date of Birth: [birth year] Gender: Female Account #: [id number] Procedure:                Upper GI endoscopy Indications:              Follow-up of gastric ulcer, Gastric intestinal                            metaplasia without dysplasia Medicines:                Monitored Anesthesia Care Procedure:                Pre-Anesthesia Assessment:                           - Prior to the procedure, a History and Physical                            was performed, and patient medications and                            allergies were reviewed. The patient's tolerance of                            previous anesthesia was also reviewed. The risks                            and benefits of the procedure and the sedation                            options and risks were discussed with the patient.                            All questions were answered, and informed consent                            was obtained. Prior Anticoagulants: The patient has                            taken no anticoagulant or antiplatelet agents. ASA                            Grade Assessment: II - A patient with mild systemic                            disease. After reviewing the risks and benefits,                            the patient was deemed in satisfactory condition to                            undergo the procedure.  After obtaining informed consent, the endoscope was                            passed under direct vision. Throughout the                            procedure, the patient's blood pressure, pulse, and                            oxygen saturations were monitored continuously. The                            Olympus Scope [id number] was introduced through the                            mouth, and advanced to  the second part of duodenum.                            The upper GI endoscopy was accomplished without                            difficulty. The patient tolerated the procedure. Scope In: Scope Out: Findings:                 No gross lesions were noted in the entire esophagus.                           The Z-line was irregular and was found 40 cm from                            the incisors.                           One non-bleeding superficial gastric ulcer with a                            clean ulcer base (Forrest Class III) was found on                            the greater curvature of the stomach. The lesion                            was 10 mm in largest dimension.                           Patchy moderate inflammation characterized by                            friability and granularity was found in the entire                            examined stomach. For gastric mapping in setting of  GIM, biopsies were taken with a cold forceps for                            histology from the cardia/fundus and then greater                            curve/lesser curve of the body and from the                            incisura/antrum.                           No gross lesions were noted in the duodenal bulb,                            in the first portion of the duodenum and in the                            second portion of the duodenum. Complications:            No immediate complications. Estimated Blood Loss:     Estimated blood loss was minimal. Impression:               - No gross lesions in the entire esophagus. Z-line                            irregular, 40 cm from the incisors.                           - Non-bleeding gastric ulcer with a clean ulcer                            base (Forrest Class III).                           - Gastritis. Mapping biopsies performed.                           - No gross lesions in the duodenal bulb, in the                             first portion of the duodenum and in the second                            portion of the duodenum. Recommendation:           - The patient will be observed post-procedure,                            until all discharge criteria are met.                           - Discharge patient to home.                           -  Patient has a contact number available for                            emergencies. The signs and symptoms of potential                            delayed complications were discussed with the                            patient. Return to normal activities tomorrow.                            Written discharge instructions were provided to the                            patient.                           - Resume previous diet.                           - Recommend restarting a PPI (Omeprazole) 40 mg                            twice daily x 2 months and then once daily                            thereafter. She had concerns with Nexium  in regards                            to changes in bowel habits. However, still has                            ulcer disease present.                           - May restart Eliquis  on 1/24 PM (discussed this                            with patient personally).                           - Continue present medications.                           - Await pathology results.                           - Repeat upper endoscopy in 4 months for                            surveillance.                           - The findings and recommendations were discussed  with the patient.                           - The findings and recommendations were discussed                            with the patient's family. Aloha Finner, MD 11/21/2024 12:06:12 PM

## 2024-11-21 NOTE — Progress Notes (Signed)
 Called to room to assist during endoscopic procedure.  Patient ID and intended procedure confirmed with present staff. Received instructions for my participation in the procedure from the performing physician.

## 2024-11-21 NOTE — Progress Notes (Signed)
 "  GASTROENTEROLOGY PROCEDURE H&P NOTE   Primary Care Physician: Charlett Apolinar POUR, MD  HPI: [name] [name] is a 74 y.o. female who presents for EGD for followup of prior GU and known GIM.  Past Medical History:  Diagnosis Date   Allergic rhinitis    Aortic insufficiency    CAT SCRATCH 09/10/2009   Qualifier: Diagnosis of  By: Charlett MD, Apolinar POUR    Clavicle fracture 12/2006   left   Diverticulosis    Heart murmur    HTN (hypertension)    Osteopenia    Paroxysmal atrial fibrillation (HCC)    Pneumonia    PONV (postoperative nausea and vomiting)    Vision disturbance 03/13/2013   Negative neuro MRA MRI   Past Surgical History:  Procedure Laterality Date   ABDOMINAL HYSTERECTOMY     pt denies 04/15/14   APPENDECTOMY     ATRIAL FIBRILLATION ABLATION N/A 02/01/2021   Procedure: ATRIAL FIBRILLATION ABLATION;  Surgeon: Kelsie Agent, MD;  Location: [facility];  Service: Cardiovascular;  Laterality: N/A;   ATRIAL FIBRILLATION ABLATION N/A 09/03/2023   Procedure: ATRIAL FIBRILLATION ABLATION;  Surgeon: Inocencio Soyla Lunger, MD;  Location: [facility];  Service: Cardiovascular;  Laterality: N/A;   BRONCHIAL WASHINGS  09/05/2021   Procedure: BRONCHIAL WASHINGS;  Surgeon: Shelah Lamar RAMAN, MD;  Location: [facility] ENDOSCOPY;  Service: Cardiopulmonary;;   ESOPHAGOGASTRODUODENOSCOPY N/A 06/05/2024   Procedure: EGD (ESOPHAGOGASTRODUODENOSCOPY);  Surgeon: Wilhelmenia Aloha Raddle., MD;  Location: [facility];  Service: Gastroenterology;  Laterality: N/A;   EUS N/A 06/05/2024   Procedure: ULTRASOUND, UPPER GI TRACT, ENDOSCOPIC;  Surgeon: Wilhelmenia Aloha Raddle., MD;  Location: [facility]COPY;  Service: Gastroenterology;  Laterality: N/A;   LAPAROSCOPIC SALPINGO OOPHERECTOMY Right    TONSILLECTOMY AND ADENOIDECTOMY     VIDEO BRONCHOSCOPY N/A 09/05/2021   Procedure: VIDEO BRONCHOSCOPY WITHOUT FLUORO;  Surgeon: Shelah Lamar RAMAN, MD;  Location: [facility]COPY;  Service: Cardiopulmonary;  Laterality: N/A;    Current Outpatient Medications  Medication Sig Dispense Refill   Coenzyme Q10 (COQ10) 100 MG CAPS Take 100 mg by mouth daily with supper.     diltiazem  (TIAZAC ) 300 MG 24 hr capsule Take 1 capsule (300 mg total) by mouth daily. 5 capsule 0   dofetilide  (TIKOSYN ) 250 MCG capsule TAKE 1 CAPSULE BY MOUTH 2 TIMES DAILY. 180 capsule 2   metoprolol  tartrate (LOPRESSOR ) 50 MG tablet TAKE 1 TABLET BY MOUTH TWICE  DAILY 200 tablet 2   Multiple Vitamins-Minerals (PRESERVISION AREDS PO) Take 1 capsule by mouth in the morning and at bedtime.     potassium chloride  (KLOR-CON ) 10 MEQ tablet TAKE 2 TABLETS BY MOUTH DAILY 200 tablet 2   pravastatin  (PRAVACHOL ) 20 MG tablet TAKE 1 TABLET BY MOUTH IN THE  EVENING 100 tablet 2   acetaminophen  (TYLENOL ) 500 MG tablet Take 325-500 mg by mouth as needed for moderate pain (pain score 4-6). (Patient taking differently: Take 650 mg by mouth as needed for moderate pain (pain score 4-6).)     alendronate  (FOSAMAX ) 70 MG tablet TAKE 1 TABLET BY MOUTH WEEKLY  WITH A FULL GLASS OF WATER ON AN EMPTY STOMACH 12 tablet 3   Cholecalciferol (VITAMIN D3) 1000 units CAPS Take 1,000 Units by mouth daily. (Patient taking differently: Take 2,000 Units by mouth daily.)     diltiazem  (CARDIZEM ) 30 MG tablet TAKE 1 TABLET EVERY 4 HOURS AS NEEDED FOR BREAKTHROUGH AFIB 45 tablet 1   ELIQUIS  5 MG TABS tablet TAKE 1 TABLET BY  MOUTH TWICE  DAILY 200 tablet 2   esomeprazole  (NEXIUM ) 40 MG capsule Take 1 capsule (40 mg total) by mouth 2 (two) times daily before a meal. Twice daily for 2 months then once daily (Patient not taking: Reported on 11/18/2024) 60 capsule 12   LORazepam  (ATIVAN ) 0.5 MG tablet Take 1 tablet (0.5 mg total) by mouth daily as needed for anxiety (when flying). (Patient taking differently: Take 0.5 mg by mouth as needed for anxiety (when flying).) 20 tablet 0   Current Facility-Administered Medications  Medication Dose Route Frequency Provider Last Rate Last Admin   0.9 %   sodium chloride  infusion  500 mL Intravenous Continuous [name], Aloha Raddle., MD       Current Medications[1] Allergies[2] Family History  Problem Relation Age of Onset   Scleroderma Mother    Hyperlipidemia Mother    Coronary artery disease Father 30   Esophageal cancer Paternal Grandmother    Colon cancer Neg Hx    Rectal cancer Neg Hx    Stomach cancer Neg Hx    Social History   Socioeconomic History   Marital status: Married    Spouse name: Not on file   Number of children: 0   Years of education: Not on file   Highest education level: Bachelor's degree (e.g., BA, AB, BS)  Occupational History   Occupation: Production Manager   Occupation: REPORTER    Employer: [employer]  Tobacco Use   Smoking status: Never   Smokeless tobacco: Never   Tobacco comments:    Never smoked 06/28/23  Vaping Use   Vaping status: Never Used  Substance and Sexual Activity   Alcohol use: Not Currently   Drug use: No   Sexual activity: Not on file  Other Topics Concern   Not on file  Social History Narrative   Married   hh of 2    p0g0   Works for [employer]    Social Drivers of Health   Tobacco Use: Low Risk (11/21/2024)   Patient History    Smoking Tobacco Use: Never    Smokeless Tobacco Use: Never    Passive Exposure: Not on file  Financial Resource Strain: Low Risk (08/04/2024)   Overall Financial Resource Strain (CARDIA)    Difficulty of Paying Living Expenses: Not hard at all  Food Insecurity: No Food Insecurity (08/04/2024)   Epic    Worried About Programme Researcher, Broadcasting/film/video in the Last Year: Never true    Ran Out of Food in the Last Year: Never true  Transportation Needs: No Transportation Needs (08/04/2024)   Epic    Lack of Transportation (Medical): No    Lack of Transportation (Non-Medical): No  Physical Activity: Insufficiently Active (08/04/2024)   Exercise Vital Sign    Days of Exercise per Week: 3 days    Minutes of Exercise per Session: 30 min  Stress:  Stress Concern Present (08/04/2024)   [employer] of Occupational Health - Occupational Stress Questionnaire    Feeling of Stress: To some extent  Social Connections: Socially Integrated (08/04/2024)   Social Connection and Isolation Panel    Frequency of Communication with Friends and Family: More than three times a week    Frequency of Social Gatherings with Friends and Family: Three times a week    Attends Religious Services: 1 to 4 times per year    Active Member of Clubs or Organizations: Yes    Attends Banker Meetings: More than 4 times per year  Marital Status: Married  Catering Manager Violence: Not At Risk (12/26/2023)   Humiliation, Afraid, Rape, and Kick questionnaire    Fear of Current or Ex-Partner: No    Emotionally Abused: No    Physically Abused: No    Sexually Abused: No  Depression (PHQ2-9): Low Risk (12/26/2023)   Depression (PHQ2-9)    PHQ-2 Score: 0  Alcohol Screen: Low Risk (12/26/2023)   Alcohol Screen    Last Alcohol Screening Score (AUDIT): 0  Housing: Low Risk (08/04/2024)   Epic    Unable to Pay for Housing in the Last Year: No    Number of Times Moved in the Last Year: 0    Homeless in the Last Year: No  Utilities: Not At Risk (12/26/2023)   AHC Utilities    Threatened with loss of utilities: No  Health Literacy: Adequate Health Literacy (12/26/2023)   B1300 Health Literacy    Frequency of need for help with medical instructions: Never    Physical Exam: Today's Vitals   11/21/24 1048 11/21/24 1055  BP: (!) 144/62   Pulse: (!) 59   Temp: 98.1 F (36.7 C) 98.1 F (36.7 C)  TempSrc: Skin   SpO2: 95%   Weight: 100 lb 12.8 oz (45.7 kg)   Height: 5' 3 (1.6 m)    Body mass index is 17.86 kg/m. GEN: NAD EYE: Sclerae anicteric ENT: MMM CV: Non-tachycardic GI: Soft, NT/ND NEURO:  Alert & Oriented x 3  Lab Results: No results for input(s): WBC, HGB, HCT, PLT in the last 72 hours. BMET No results for input(s): NA,  K, CL, CO2, GLUCOSE, BUN, CREATININE, CALCIUM  in the last 72 hours. LFT No results for input(s): PROT, ALBUMIN, AST, ALT, ALKPHOS, BILITOT, BILIDIR, IBILI in the last 72 hours. PT/INR No results for input(s): LABPROT, INR in the last 72 hours.   Impression / Plan: This is a 74 y.o.female who presents for EGD for followup of prior GU.  The risks and benefits of endoscopic evaluation/treatment were discussed with the patient and/or family; these include but are not limited to the risk of perforation, infection, bleeding, missed lesions, lack of diagnosis, severe illness requiring hospitalization, as well as anesthesia and sedation related illnesses.  The patient's history has been reviewed, patient examined, no change in status, and deemed stable for procedure.  The patient and/or family was provided an opportunity to ask questions and all were answered.  The patient and/or family is agreeable to proceed.    Aloha Finner, MD [facility] Office # [phone]     [1]  Current Outpatient Medications:    Coenzyme Q10 (COQ10) 100 MG CAPS, Take 100 mg by mouth daily with supper., Disp: , Rfl:    diltiazem  (TIAZAC ) 300 MG 24 hr capsule, Take 1 capsule (300 mg total) by mouth daily., Disp: 5 capsule, Rfl: 0   dofetilide  (TIKOSYN ) 250 MCG capsule, TAKE 1 CAPSULE BY MOUTH 2 TIMES DAILY., Disp: 180 capsule, Rfl: 2   metoprolol  tartrate (LOPRESSOR ) 50 MG tablet, TAKE 1 TABLET BY MOUTH TWICE  DAILY, Disp: 200 tablet, Rfl: 2   Multiple Vitamins-Minerals (PRESERVISION AREDS PO), Take 1 capsule by mouth in the morning and at bedtime., Disp: , Rfl:    potassium chloride  (KLOR-CON ) 10 MEQ tablet, TAKE 2 TABLETS BY MOUTH DAILY, Disp: 200 tablet, Rfl: 2   pravastatin  (PRAVACHOL ) 20 MG tablet, TAKE 1 TABLET BY MOUTH IN THE  EVENING, Disp: 100 tablet, Rfl: 2   acetaminophen  (TYLENOL ) 500 MG tablet, Take 325-500  mg by mouth as needed for  moderate pain (pain score 4-6). (Patient taking differently: Take 650 mg by mouth as needed for moderate pain (pain score 4-6).), Disp: , Rfl:    alendronate  (FOSAMAX ) 70 MG tablet, TAKE 1 TABLET BY MOUTH WEEKLY  WITH A FULL GLASS OF WATER ON AN EMPTY STOMACH, Disp: 12 tablet, Rfl: 3   Cholecalciferol (VITAMIN D3) 1000 units CAPS, Take 1,000 Units by mouth daily. (Patient taking differently: Take 2,000 Units by mouth daily.), Disp: , Rfl:    diltiazem  (CARDIZEM ) 30 MG tablet, TAKE 1 TABLET EVERY 4 HOURS AS NEEDED FOR BREAKTHROUGH AFIB, Disp: 45 tablet, Rfl: 1   ELIQUIS  5 MG TABS tablet, TAKE 1 TABLET BY MOUTH TWICE  DAILY, Disp: 200 tablet, Rfl: 2   esomeprazole  (NEXIUM ) 40 MG capsule, Take 1 capsule (40 mg total) by mouth 2 (two) times daily before a meal. Twice daily for 2 months then once daily (Patient not taking: Reported on 11/18/2024), Disp: 60 capsule, Rfl: 12   LORazepam  (ATIVAN ) 0.5 MG tablet, Take 1 tablet (0.5 mg total) by mouth daily as needed for anxiety (when flying). (Patient taking differently: Take 0.5 mg by mouth as needed for anxiety (when flying).), Disp: 20 tablet, Rfl: 0  Current Facility-Administered Medications:    0.9 %  sodium chloride  infusion, 500 mL, Intravenous, Continuous, [name], Aloha Raddle., MD [2]  Allergies Allergen Reactions   Latex Hives   Other Rash and Other (See Comments)    Adhesive pads left on skin for extended periods = BURNS AND RASHES ON THE SKIN!!!!   Codeine Phosphate Other (See Comments)    Drowsiness    Demerol [Meperidine] Nausea And Vomiting   Protonix  [Pantoprazole ] Rash   Sulfa  Antibiotics Rash   Tens Therapy Replace Back Pads Itching and Rash   "

## 2024-11-21 NOTE — Progress Notes (Signed)
 Recall for f/u office visit in 2 months placed by S Seagraves due to schedule not out that far  Recall for repeat egd in 4 months placed due to schedule not out

## 2024-11-25 ENCOUNTER — Telehealth: Payer: Self-pay

## 2024-11-25 NOTE — Telephone Encounter (Signed)
" °  Follow up Call-     11/21/2024   10:55 AM 05/06/2024    9:20 AM  Call back number  Post procedure Call Back phone  # [phone]  Permission to leave phone message Yes Yes     Patient questions:  Do you have a fever, pain , or abdominal swelling? No. Pain Score  0 *  Have you tolerated food without any problems? Yes.    Have you been able to return to your normal activities? Yes.    Do you have any questions about your discharge instructions: Diet   No. Medications  No. Follow up visit  No.  Do you have questions or concerns about your Care? No.  Actions: * If pain score is 4 or above: No action needed, pain <4.   "

## 2024-11-27 LAB — SURGICAL PATHOLOGY

## 2024-11-29 ENCOUNTER — Encounter: Payer: Self-pay | Admitting: Internal Medicine

## 2024-11-29 ENCOUNTER — Ambulatory Visit: Payer: Self-pay | Admitting: Gastroenterology

## 2024-11-29 DIAGNOSIS — H9313 Tinnitus, bilateral: Secondary | ICD-10-CM

## 2024-12-04 NOTE — Telephone Encounter (Signed)
 Certainly can try to go in a network  !  .  Please place refer to  audiology in network  [place]  should  hopefully be in netword

## 2024-12-05 ENCOUNTER — Encounter: Payer: Self-pay | Admitting: Cardiology

## 2024-12-22 ENCOUNTER — Ambulatory Visit: Admitting: Audiologist

## 2024-12-29 ENCOUNTER — Ambulatory Visit: Payer: Medicare Other

## 2024-12-30 ENCOUNTER — Encounter: Admitting: Internal Medicine

## 2025-03-03 ENCOUNTER — Other Ambulatory Visit (HOSPITAL_COMMUNITY)

## 2025-05-26 ENCOUNTER — Ambulatory Visit (HOSPITAL_COMMUNITY): Admitting: Physician Assistant

## 2025-06-17 ENCOUNTER — Ambulatory Visit

## 2025-09-29 ENCOUNTER — Ambulatory Visit: Admitting: Internal Medicine
